# Patient Record
Sex: Male | Born: 1960 | Race: White | Hispanic: No | Marital: Married | Smoking: Never smoker
Health system: Southern US, Community
[De-identification: ages and names within clinical notes are randomized; demographics above are authoritative.]

## PROBLEM LIST (undated history)

## (undated) DIAGNOSIS — B1920 Unspecified viral hepatitis C without hepatic coma: Secondary | ICD-10-CM

## (undated) DIAGNOSIS — J45909 Unspecified asthma, uncomplicated: Secondary | ICD-10-CM

## (undated) DIAGNOSIS — D61818 Other pancytopenia: Secondary | ICD-10-CM

## (undated) DIAGNOSIS — K828 Other specified diseases of gallbladder: Secondary | ICD-10-CM

## (undated) DIAGNOSIS — Z6841 Body Mass Index (BMI) 40.0 and over, adult: Secondary | ICD-10-CM

## (undated) DIAGNOSIS — J449 Chronic obstructive pulmonary disease, unspecified: Secondary | ICD-10-CM

## (undated) DIAGNOSIS — F1910 Other psychoactive substance abuse, uncomplicated: Secondary | ICD-10-CM

## (undated) DIAGNOSIS — N289 Disorder of kidney and ureter, unspecified: Secondary | ICD-10-CM

## (undated) DIAGNOSIS — D509 Iron deficiency anemia, unspecified: Secondary | ICD-10-CM

## (undated) DIAGNOSIS — K219 Gastro-esophageal reflux disease without esophagitis: Secondary | ICD-10-CM

## (undated) DIAGNOSIS — G629 Polyneuropathy, unspecified: Secondary | ICD-10-CM

## (undated) DIAGNOSIS — K766 Portal hypertension: Secondary | ICD-10-CM

## (undated) DIAGNOSIS — G8929 Other chronic pain: Secondary | ICD-10-CM

## (undated) DIAGNOSIS — R161 Splenomegaly, not elsewhere classified: Secondary | ICD-10-CM

## (undated) DIAGNOSIS — F1410 Cocaine abuse, uncomplicated: Secondary | ICD-10-CM

## (undated) DIAGNOSIS — Z9119 Patient's noncompliance with other medical treatment and regimen: Secondary | ICD-10-CM

## (undated) DIAGNOSIS — K648 Other hemorrhoids: Secondary | ICD-10-CM

## (undated) DIAGNOSIS — K8020 Calculus of gallbladder without cholecystitis without obstruction: Secondary | ICD-10-CM

## (undated) DIAGNOSIS — M79606 Pain in leg, unspecified: Secondary | ICD-10-CM

## (undated) DIAGNOSIS — K759 Inflammatory liver disease, unspecified: Secondary | ICD-10-CM

## (undated) DIAGNOSIS — I10 Essential (primary) hypertension: Secondary | ICD-10-CM

## (undated) DIAGNOSIS — K7460 Unspecified cirrhosis of liver: Secondary | ICD-10-CM

## (undated) HISTORY — DX: Other specified diseases of gallbladder: K82.8

## (undated) HISTORY — DX: Splenomegaly, not elsewhere classified: R16.1

## (undated) HISTORY — DX: Calculus of gallbladder without cholecystitis without obstruction: K80.20

## (undated) HISTORY — DX: Iron deficiency anemia, unspecified: D50.9

## (undated) HISTORY — DX: Other psychoactive substance abuse, uncomplicated: F19.10

## (undated) HISTORY — DX: Other pancytopenia: D61.818

## (undated) HISTORY — PX: SIGMOIDOSCOPY: SUR1295

## (undated) HISTORY — DX: Body Mass Index (BMI) 40.0 and over, adult: Z684

## (undated) HISTORY — PX: KNEE SURGERY: SHX244

## (undated) HISTORY — PX: HEMORRHOID SURGERY: SHX153

## (undated) HISTORY — DX: Unspecified cirrhosis of liver: K74.60

## (undated) HISTORY — DX: Gastro-esophageal reflux disease without esophagitis: K21.9

## (undated) HISTORY — DX: Inflammatory liver disease, unspecified: K75.9

## (undated) HISTORY — DX: Other hemorrhoids: K64.8

## (undated) HISTORY — DX: Patient's noncompliance with other medical treatment and regimen: Z91.19

---

## 2004-04-08 ENCOUNTER — Ambulatory Visit (HOSPITAL_COMMUNITY): Admission: RE | Admit: 2004-04-08 | Discharge: 2004-04-08 | Payer: Self-pay | Admitting: Family Medicine

## 2009-05-17 ENCOUNTER — Ambulatory Visit (HOSPITAL_COMMUNITY): Admission: RE | Admit: 2009-05-17 | Discharge: 2009-05-17 | Payer: Self-pay | Admitting: Internal Medicine

## 2009-06-05 ENCOUNTER — Ambulatory Visit: Payer: Self-pay | Admitting: Gastroenterology

## 2009-06-05 DIAGNOSIS — Z862 Personal history of diseases of the blood and blood-forming organs and certain disorders involving the immune mechanism: Secondary | ICD-10-CM

## 2009-06-05 DIAGNOSIS — F1010 Alcohol abuse, uncomplicated: Secondary | ICD-10-CM | POA: Insufficient documentation

## 2009-06-05 DIAGNOSIS — K869 Disease of pancreas, unspecified: Secondary | ICD-10-CM | POA: Insufficient documentation

## 2009-06-05 DIAGNOSIS — Z8639 Personal history of other endocrine, nutritional and metabolic disease: Secondary | ICD-10-CM

## 2009-06-05 DIAGNOSIS — B182 Chronic viral hepatitis C: Secondary | ICD-10-CM

## 2009-06-18 ENCOUNTER — Encounter: Payer: Self-pay | Admitting: Gastroenterology

## 2009-06-25 ENCOUNTER — Encounter: Payer: Self-pay | Admitting: Internal Medicine

## 2009-06-27 ENCOUNTER — Ambulatory Visit (HOSPITAL_COMMUNITY): Admission: RE | Admit: 2009-06-27 | Discharge: 2009-06-27 | Payer: Self-pay | Admitting: Internal Medicine

## 2009-07-04 ENCOUNTER — Encounter: Payer: Self-pay | Admitting: Gastroenterology

## 2009-07-10 ENCOUNTER — Telehealth: Payer: Self-pay | Admitting: Gastroenterology

## 2009-07-11 HISTORY — PX: UPPER GASTROINTESTINAL ENDOSCOPY: SHX188

## 2009-07-25 ENCOUNTER — Encounter: Payer: Self-pay | Admitting: Gastroenterology

## 2009-07-27 ENCOUNTER — Ambulatory Visit: Payer: Self-pay | Admitting: Gastroenterology

## 2009-07-27 ENCOUNTER — Ambulatory Visit (HOSPITAL_COMMUNITY): Admission: RE | Admit: 2009-07-27 | Discharge: 2009-07-27 | Payer: Self-pay | Admitting: Gastroenterology

## 2009-08-02 ENCOUNTER — Encounter: Payer: Self-pay | Admitting: Gastroenterology

## 2009-10-25 ENCOUNTER — Ambulatory Visit: Payer: Self-pay | Admitting: Gastroenterology

## 2009-10-25 ENCOUNTER — Encounter: Payer: Self-pay | Admitting: Internal Medicine

## 2009-10-25 DIAGNOSIS — K219 Gastro-esophageal reflux disease without esophagitis: Secondary | ICD-10-CM

## 2009-10-25 DIAGNOSIS — R5383 Other fatigue: Secondary | ICD-10-CM

## 2009-10-25 DIAGNOSIS — R5381 Other malaise: Secondary | ICD-10-CM

## 2009-10-25 DIAGNOSIS — K7460 Unspecified cirrhosis of liver: Secondary | ICD-10-CM

## 2009-10-25 HISTORY — DX: Gastro-esophageal reflux disease without esophagitis: K21.9

## 2009-11-08 ENCOUNTER — Encounter: Payer: Self-pay | Admitting: Gastroenterology

## 2009-11-19 ENCOUNTER — Encounter: Payer: Self-pay | Admitting: Gastroenterology

## 2009-12-10 ENCOUNTER — Encounter (INDEPENDENT_AMBULATORY_CARE_PROVIDER_SITE_OTHER): Payer: Self-pay | Admitting: *Deleted

## 2009-12-27 ENCOUNTER — Ambulatory Visit: Payer: Self-pay | Admitting: Gastroenterology

## 2010-01-28 ENCOUNTER — Encounter: Payer: Self-pay | Admitting: Gastroenterology

## 2010-02-15 ENCOUNTER — Encounter: Payer: Self-pay | Admitting: Urgent Care

## 2010-02-15 ENCOUNTER — Ambulatory Visit: Payer: Self-pay | Admitting: Internal Medicine

## 2010-02-15 DIAGNOSIS — E119 Type 2 diabetes mellitus without complications: Secondary | ICD-10-CM

## 2010-02-21 ENCOUNTER — Ambulatory Visit (HOSPITAL_COMMUNITY): Admission: RE | Admit: 2010-02-21 | Discharge: 2010-02-21 | Payer: Self-pay | Admitting: Internal Medicine

## 2010-02-28 ENCOUNTER — Ambulatory Visit: Payer: Self-pay | Admitting: Gastroenterology

## 2010-03-05 ENCOUNTER — Encounter: Payer: Self-pay | Admitting: Gastroenterology

## 2010-03-07 ENCOUNTER — Encounter: Payer: Self-pay | Admitting: Gastroenterology

## 2010-03-12 ENCOUNTER — Ambulatory Visit (HOSPITAL_COMMUNITY)
Admission: RE | Admit: 2010-03-12 | Discharge: 2010-03-12 | Payer: Self-pay | Source: Home / Self Care | Admitting: Internal Medicine

## 2010-03-12 ENCOUNTER — Encounter: Payer: Self-pay | Admitting: Urgent Care

## 2010-05-17 ENCOUNTER — Encounter: Payer: Self-pay | Admitting: Gastroenterology

## 2010-06-03 ENCOUNTER — Encounter: Payer: Self-pay | Admitting: Urgent Care

## 2010-08-20 ENCOUNTER — Encounter (INDEPENDENT_AMBULATORY_CARE_PROVIDER_SITE_OTHER): Payer: Self-pay | Admitting: *Deleted

## 2010-09-05 ENCOUNTER — Other Ambulatory Visit: Payer: Self-pay | Admitting: Internal Medicine

## 2010-09-05 ENCOUNTER — Ambulatory Visit
Admission: RE | Admit: 2010-09-05 | Discharge: 2010-09-05 | Payer: Self-pay | Source: Home / Self Care | Attending: Gastroenterology | Admitting: Gastroenterology

## 2010-09-05 DIAGNOSIS — K7460 Unspecified cirrhosis of liver: Secondary | ICD-10-CM

## 2010-09-05 DIAGNOSIS — R109 Unspecified abdominal pain: Secondary | ICD-10-CM | POA: Insufficient documentation

## 2010-09-06 ENCOUNTER — Encounter: Payer: Self-pay | Admitting: Internal Medicine

## 2010-09-10 NOTE — Letter (Signed)
Summary: REQUEST FOR MEDICAL RECORDS  REQUEST FOR MEDICAL RECORDS   Imported By: Rexene Alberts 06/03/2010 12:25:28  _____________________________________________________________________  External Attachment:    Type:   Image     Comment:   External Document

## 2010-09-10 NOTE — Letter (Signed)
Summary: intialeval/referral-dr.stevenzacks  intialeval/referral-dr.stevenzacks   Imported By: Rosine Beat 01/28/2010 13:32:36  _____________________________________________________________________  External Attachment:    Type:   Image     Comment:   External Document

## 2010-09-10 NOTE — Assessment & Plan Note (Signed)
Summary: fu ov in 4 months,fatigue,gerd,cirrhosis,hep c/ss   Visit Type:  Follow-up Visit Primary Care Provider:  [facility]  Chief Complaint:  F/U fatigue/gerd/cirrhosis/hep.  History of Present Illness: Seen Hepatitis Clinic at [facility]  Needs to get blood sugars regulated.  May be candidate for treatment for HCV soon, has appt 7/23.  Occ nausea.  c/o occ upper abd "soreness."  Denies fever, chills, rash, or jaundice.  Denies confusion or MS changes.  c/o eating less and has lost 16# in 9 mo.  Never FU w/ CT as planned to re-evaluate lymph nodes around stomach.  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 Take 1 Tablet By Mouth Two Times A Day 2)  Glipizide 10 Mg Tabs (Glipizide) .... Take 1 Tablet By Mouth Two Times A Day 3)  Lisinopril 10 Mg Tabs (Lisinopril) .... Take 1 Tablet By Mouth Once A Day 4)  Omeprazole 20 Mg Cpdr (Omeprazole) .Marland Kitchen.. 1 By Mouth 30 Minutes Before First Meal  Allergies (verified): No Known Drug Allergies  Past History:  Past Surgical History: Last updated: 06/05/2009 Right knee surgery Hemorrhoidectomy  Past Medical History: Diabetes GERD Hypertension Hepatitis C/alcoholic cirrhosis EGD 07/2010->gastritis, no varices Flexible sigmoidoscopy 9/01, Dr. Gabriel Cirri, small thrombosed external hemorrhoids, several internal hemorrhoids.  Review of Systems      See HPI General:  Denies fever, chills, sweats, anorexia, fatigue, weakness, malaise, weight loss, and sleep disorder. CV:  Denies chest pains, angina, palpitations, syncope, dyspnea on exertion, orthopnea, PND, peripheral edema, and claudication. Resp:  Denies  dyspnea at rest, dyspnea with exercise, cough, sputum, wheezing, coughing up blood, and pleurisy. GI:  Denies difficulty swallowing, pain on swallowing, nausea, indigestion/heartburn, vomiting, vomiting blood, abdominal pain, jaundice, gas/bloating, diarrhea, constipation, change in bowel habits, bloody BM's, black BMs, and fecal incontinence. Derm:  Denies rash, itching, dry skin, hives, moles, warts, and unhealing ulcers. Psych:  Denies depression, anxiety, memory loss, suicidal ideation, hallucinations, paranoia, phobia, and confusion. Heme:  Denies bruising, bleeding, and enlarged lymph nodes.  Vital Signs:  Patient profile:   50 year old male Height:      67 inches Weight:      268 pounds BMI:     42.13 Temp:     97.8 degrees F oral Pulse rate:   68 / minute BP sitting:   130 / 70  (left arm) Cuff size:   large  Vitals Entered By: Cloria Spring LPN (February 16, 5955 8:27 AM)  Physical Exam  General:  Well developed, well nourished, no acute distress. Head:  Normocephalic and atraumatic. Eyes:  Sclera clear no icterus. Mouth:  No deformity or lesionS. Neck:  Supple; no masses. Heart:  Regular rate and rhythm; no murmurs Abdomen:  palpable spleen.  Liver 2 FB below RCM.normal bowel sounds, obese, without guarding, without rebound, no hernia, and no distesion.   Msk:  Symmetrical with no gross deformities. Normal posture. Extremities:  No clubbing, cyanosis, edema or deformities noted. Neurologic:  Alert and  oriented x4;  grossly normal neurologically.  Skin:  Intact without significant lesions or rashes. Cervical Nodes:  No significant cervical adenopathy. Psych:  Alert and cooperative. Normal mood and  affect.  Impression & Recommendations:  Problem # 1:  CIRRHOSIS (ICD-571.5) Well-compensated HCV/eoth cirrhosis, being followed by [facility].  Abnl CT last yr with lymph nodes about the stomach needs to be re-evaluated.  No varices on EGD.  Due for [facility]  screening.  Orders: Est. Patient Level III ([address])  Problem # 2:  HEPATITIS C, CHRONIC (ICD-070.54) See #1  Problem # 3:  GERD (ICD-530.81) Well-controlled on omeprazole  Patient Instructions: 1)  Continue omeprazole 2)  CT abd/pelvis w/ IV/oral contrast 3)  Obtain labs [facility] clinic, if not done, he needs AFP, LFTs, INR, CBC 4)  FU OV 6 months w/ Dr Darrick Penna  Appended Document: fu ov in 4 months,fatigue,gerd,cirrhosis,hep c/ss reminder in computer  Appended Document: fu ov in 4 months,fatigue,gerd,cirrhosis,hep c/ss Has pt had labs done at [facility] mentioned above in past 6 months?  If not, we need to draw.  Thanks.  Appended Document: fu ov in 4 months,fatigue,gerd,cirrhosis,hep c/ss Requested labs from [facility] on 03/20/10/LAW

## 2010-09-10 NOTE — Letter (Signed)
Summary: CT ABD order  CT ABD order   Imported By: Minna Merritts 03/07/2010 17:12:11  _____________________________________________________________________  External Attachment:    Type:   Image     Comment:   External Document

## 2010-09-10 NOTE — Letter (Signed)
[facility]  [facility]   Imported By: Rosine Beat 03/05/2010 13:52:23  _____________________________________________________________________  External Attachment:    Type:   Image     Comment:   External Document

## 2010-09-10 NOTE — Letter (Signed)
Summary: [place] order  [place] order   Imported By: Hendricks Limes LPN 27/25/3664 40:34:74  _____________________________________________________________________  External Attachment:    Type:   Image     Comment:   External Document  Appended Document: [place] order Please change abd [place] to CT abd /pelvis w/ IV/oral contrast re:  FU lymph nodes around stomach (compare w/ previous CT) and HCC screening  Appended Document: [place] order Pt's wife aware the test will be changed to CT. She said please try to schedule for the 14th, that is the day that she has gotten off work for the previously scheduled test. OK to leave appt info on the [phone] number.

## 2010-09-10 NOTE — Letter (Signed)
Summary: HEP C CLINIC REFERRAL  HEP C CLINIC REFERRAL   Imported By: Ave Filter 10/25/2009 10:36:43  _____________________________________________________________________  External Attachment:    Type:   Image     Comment:   External Document

## 2010-09-10 NOTE — Letter (Signed)
Summary: LABS/FREE CLINIC  LABS/FREE CLINIC   Imported By: Diana Eves 11/08/2009 15:07:13  _____________________________________________________________________  External Attachment:    Type:   Image     Comment:   External Document

## 2010-09-10 NOTE — Letter (Signed)
Summary: [facility]PT CONFIRMATION  [facility]PT CONFIRMATION   Imported By: Ave Filter 11/19/2009 13:01:54  _____________________________________________________________________  External Attachment:    Type:   Image     Comment:   External Document  Appended Document: [facility]PT CONFIRMATION Pt's wife informed.

## 2010-09-10 NOTE — Letter (Signed)
Summary: DISABILITY DETERMINATION  DISABILITY DETERMINATION   Imported By: Rexene Alberts 05/17/2010 12:25:50  _____________________________________________________________________  External Attachment:    Type:   Image     Comment:   External Document

## 2010-09-10 NOTE — Letter (Signed)
Summary: HEP C CLINIC CONFIRMATION LETTER  HEP C CLINIC CONFIRMATION LETTER   Imported By: Ave Filter 10/25/2009 11:41:49  _____________________________________________________________________  External Attachment:    Type:   Image     Comment:   External Document

## 2010-09-10 NOTE — Assessment & Plan Note (Signed)
Summary: GERD, CIRRHOSIS, FATIGUE, HCV   Visit Type:  Follow-up Visit Primary Care Provider:  Free clinic  Chief Complaint:  cirrhosis.  History of Present Illness: Low energy. Been throwing up depending on what he eats: onions, lettuce, tomato. Blood in his urine. Urine looked like tea and took collection to lab. No yellow eyes or skin. No blood in stool or black tarry stool. BM: every day-normal. No weight loss. Weight gain: 269->284 lbs. Been a year since no EtOH. No problems swallowing. Taking OMP prior to first meal. Pain in abd better, but still has pain on the sides. No swelling in legs. Toes stay cold and numb. Asking about disability  Current Medications (verified): 1)  Metformin Hcl 1000 Mg Tabs (Metformin Hcl) .... Take 1 Tablet By Mouth Two Times A Day 2)  Glipizide 10 Mg Tabs (Glipizide) .... Take 1 Tablet By Mouth Two Times A Day 3)  Lisinopril 10 Mg Tabs (Lisinopril) .... Take 1 Tablet By Mouth Once A Day 4)  Ranitidine Hcl 300 Mg Caps (Ranitidine Hcl) .... Take 1 Tablet By Mouth Once A Day  Allergies (verified): No Known Drug Allergies  Past History:  Past Medical History: Last updated: 06/05/2009 Diabetes GERD Hypertension Hepatitis C Flexible sigmoidoscopy 9/01, Dr. Gabriel Cirri, small thrombosed external hemorrhoids, several internal hemorrhoids.  Social History: Married 18 years. Daughter. 2 stepsons. 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 Never tob. ETOH use, LAST TIME: AUG 01-08-09.  Last 18 years, 1-2 twelve ounce beers daily. Prior remote IV drug use: AGE 27. Father died Jan 09, 2004.  Review of Systems       Severe depression when mother passed in 01/08/06. Tried meds but caused tactile hallucinations. No suicide thoughts or suicide attempts. No psychiatric admissions.  Vital Signs:  Patient profile:   50 year old male Height:      67 inches Weight:      284 pounds BMI:     44.64 Temp:     97.5 degrees F oral Pulse rate:   64 / minute BP sitting:   120 / 64   (left arm) Cuff size:   large  Vitals Entered By: Cloria Spring LPN (October 25, 2009 9:02 AM)  Physical Exam  General:  Well developed, well nourished, no acute distress. Head:  Normocephalic and atraumatic. Eyes:  PERRLA, no icterus. Mouth:  No deformity or lesionS. Neck:  Supple; no masses. Lungs:  Clear throughout to auscultation. Heart:  Regular rate and rhythm; no murmurs  Impression & Recommendations:  Problem # 1:  GERD (ICD-530.81) Assessment Deteriorated uncontrolled and likely causing intermittent vomtiing. Continue OMP. Informed pt to avoid foods that may exacerbate reflux. Encouraged to lose weight for reflux and better diabetes control. Reflux HO given.  Problem # 2:  CIRRHOSIS (ICD-571.5) Assessment: Unchanged  Needs AFP, HBsAb and TOTAL HAV today. Imaging/AFP NOV 2010.  Orders: T-CBC w/Diff [id number]) T-Hepatic Function [id number]) T-TSH [phone]) T-AFP Tumor Markers [phone]) T-Hepatitis C Antibody ([id number]) Est. Patient Level V (02725)  Problem # 3:  FATIGUE (ICD-780.79) Assessment: New  Pt c/o fatigue and tea colored stool. No jaundice or evidenec of active bleeding. Fatigue likey 2o to multiple comorbidities: DM, Cirrhosis, obesity. Differential includes thyroid disturbance or occult GIB. Will check CBC, HFP, and TSH.  Orders: Est. Patient Level V (36644)  Problem # 4:  HEPATITIS C, CHRONIC (ICD-070.54) Assessment: Unchanged  No EtOH in > 6 MOS. Refer to [facility] [place].  CC: PCP  Orders: T-CBC w/Diff [id number]) T-Hepatic Function [phone]) T-TSH [id number])  T-AFP Tumor Markers [phone]) T-Hepatitis C Antibody ([id number]) Est. Patient Level V (29562)  Problem # 5:  SCREENING, COLON CANCER (ICD-V76.51) Assessment: Comment Only TCS  at age 71, unless he has a low HB.  Patient Instructions: 1)  Take omeprazole 30 minutes before first meal. 2)  Follow REFLUX RECOMMENDATIONS. 3)  WILL CALL YOU WITH LAB  RESULTS. 4)  RETURN VISIT IN 4 MONTHS. 5)  The medication list was reviewed and reconciled.  All changed / newly prescribed medications were explained.  A complete medication list was provided to the patient / caregiver. Prescriptions: OMEPRAZOLE 20 MG CPDR (OMEPRAZOLE) 1 by mouth 30 minutes before first meal  #30 x 5   Entered and Authorized by:   West Bali MD   Signed by:   West Bali MD on 10/25/2009   Method used:   Electronically to        [facility] (retail)       [address] [place], [place]  [address]       Ph: [phone]       Fax: [phone]   RxID:   [id number]

## 2010-09-10 NOTE — Letter (Signed)
Summary: Recall Radiology  [facility]  [address]   [place], [place] [address]   Phone: [phone]  Fax: [phone]    Dec 10, 2009  [name] [address] [place], [place]  [address] Jul 10, 1961   Dear [name],   Our office needs to get you scheduled for your CT Scan. Please give our office a call to schedule this.  You may call the office at your convenience at [phone].  Please ask for the Referral Coordinator to make arrangements for this to be scheduled.  You may have to leave a message on our voice mail.  We will return your call.  If for any reason you do not wish to schedule this, please advise the office.  Please do not neglect your health.   Thank you,    Ave Filter  [facility] Ph: [phone]   Fax: [phone]    Appended Document: Recall Radiology [name] called regarding the letter received RE: f/u CT scan.  She states that at present they are awaiting visit with Hep C clinic and have no insurance.  She has no idea of the amount that would be covered by sliding scale fee.  Would like to see Hep C clinic and I advised that I would let Dr. Darrick Penna know and after that visit if she had further recommendations to proceed with further testing not ordered by Hep clinic we would call them back.  Appended Document: Recall Radiology Please call pt and let him know the CT Scan was to be performed to evaluate LNs in his abdomen. Agrre with HCV visit but will need to have a CT w/i the next 1-2 mos.  Appended Document: Recall Radiology pts wife aware

## 2010-09-10 NOTE — Medication Information (Signed)
Summary: OMEPRAZOLE  OMEPRAZOLE   Imported By: Rexene Alberts 03/12/2010 10:40:28  _____________________________________________________________________  External Attachment:    Type:   Image     Comment:   External Document  Appended Document: OMEPRAZOLE    Prescriptions: OMEPRAZOLE 20 MG CPDR (OMEPRAZOLE) 1 by mouth 30 minutes before first meal  #30 x 11   Entered and Authorized by:   Joselyn Arrow FNP-BC   Signed by:   Joselyn Arrow FNP-BC on 03/12/2010   Method used:   Electronically to        [facility] (retail)       [address] [place], [place]  [address]       Ph: [phone]       Fax: [phone]   RxID:   [id number]

## 2010-09-11 ENCOUNTER — Ambulatory Visit (HOSPITAL_COMMUNITY)
Admission: RE | Admit: 2010-09-11 | Discharge: 2010-09-11 | Disposition: A | Discharge status: Home / Self Care | Payer: Self-pay | Source: Ambulatory Visit | Attending: Internal Medicine | Admitting: Internal Medicine

## 2010-09-11 ENCOUNTER — Encounter: Payer: Self-pay | Admitting: Gastroenterology

## 2010-09-11 DIAGNOSIS — B1920 Unspecified viral hepatitis C without hepatic coma: Secondary | ICD-10-CM | POA: Insufficient documentation

## 2010-09-11 DIAGNOSIS — R161 Splenomegaly, not elsewhere classified: Secondary | ICD-10-CM | POA: Insufficient documentation

## 2010-09-11 DIAGNOSIS — K7460 Unspecified cirrhosis of liver: Secondary | ICD-10-CM | POA: Insufficient documentation

## 2010-09-11 LAB — CONVERTED CEMR LAB: INR: 0.98 (ref <1.50)

## 2010-09-12 ENCOUNTER — Encounter: Payer: Self-pay | Admitting: Gastroenterology

## 2010-09-12 NOTE — Letter (Signed)
Summary: RAD REPORT U/S ABD  RAD REPORT U/S ABD   Imported By: Rexene Alberts 09/06/2010 08:48:10  _____________________________________________________________________  External Attachment:    Type:   Image     Comment:   External Document

## 2010-09-12 NOTE — Letter (Signed)
Summary: Recall Office Visit  [facility]  [address]   [place], [place] [address]   Phone: [phone]  Fax: [phone]      August 20, 2010   [name] [address] [place], [place]  [address] 05-Sep-1960   Dear [name],   According to our records, it is time for you to schedule a follow-up office visit with [place].   At your convenience, please call [phone] to schedule an office visit. If you have any questions, concerns, or feel that this letter is in error, we would appreciate your call.   Sincerely,    Diana Eves  [facility] Ph: [phone]   Fax: [phone]

## 2010-09-16 ENCOUNTER — Encounter: Payer: Self-pay | Admitting: Gastroenterology

## 2010-09-16 LAB — CONVERTED CEMR LAB
ALT: 126 units/L — ABNORMAL HIGH (ref 0–53)
Alkaline Phosphatase: 88 units/L (ref 39–117)
Indirect Bilirubin: 0.5 mg/dL (ref 0.0–0.9)

## 2010-09-18 NOTE — Assessment & Plan Note (Signed)
Summary: LIVER IS SWELLING.FU OV/CIRRHOSIS/SS   Visit Type:  Follow-up Visit Primary Care Provider:  [facility]  CC:  follow up= cirrhosis and still having some pain.  History of Present Illness: Presents in f/u. Hx of chronic Hep C, ETOH cirrhosis. Some confusion regarding Hep C clinic. Saw Dr. [name] Lasso twice. First in Dec 30, 2022, which notes we do have. We do not have the f/u visit done after that. Pt states was told "they don't have a nurse to do the injections". Pt is quite unclear as to status of care with chronic Hep C. Presents today c/o chronic RUQ pain/ right back pain, feels like it "pulls". . pain exacerbated by movement. worsened with eating/drinking. no n/v. no jaundice, denies confusion and/or MS changes. . +soreness constant in right upper abdomen. 5/10. not on omeprazole anymore. unsure of what he is taking now, had been having nocturnal reflux. states started on a new medication, helped "alot". pt feels wt gain is due to dietary issues. Up 12 lbs from last July. was 268 now 280. Has +stones, no HIDA documented in past.   CT 03/2010:  Stable mildly prominent upper abdominal and right cardiophrenic   angle lymph nodes.   Cirrhotic appearing liver with splenomegaly, perisplenic varices   and spontaneous splenorenal shunt.   Cholelithiasis.   No new upper abdominal abnormalities.   Current Medications (verified): 1)  Metformin Hcl 1000 Mg Tabs (Metformin Hcl) .... Take 1 Tablet By Mouth Two Times A Day 2)  Glipizide 10 Mg Tabs (Glipizide) .... Take 1 Tablet By Mouth Two Times A Day 3)  Lisinopril 20 Mg Tabs (Lisinopril) .... Once Daily 4)  Omeprazole 20 Mg Cpdr (Omeprazole) .Marland Kitchen.. 1 By Mouth 30 Minutes Before First Meal  Allergies (verified): No Known Drug Allergies  Past History:  Past Medical History: Last updated: 02/15/2010 Diabetes GERD Hypertension Hepatitis C/alcoholic cirrhosis EGD 07/2010->gastritis, no varices Flexible sigmoidoscopy 9/01, Dr. Gabriel Cirri, small thrombosed  external hemorrhoids, several internal hemorrhoids.  Past Surgical History: Last updated: 06/05/2009 Right knee surgery Hemorrhoidectomy  Family History: Last updated: 06/05/2009 Pat aunt, cirrhosis, alcohol related? Mat uncle, cirrhosis, alcohol related? Father, deceased, melanoma, DM, COPD Mother, deceased, COPD No FH colon cancer Cousin, HCV, s/p treatment  Social History: Last updated: 10/25/2009 Married 18 years. Daughter. 2 stepsons. 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 Never tob. ETOH use, LAST TIME: AUG 12-29-2008.  Last 18 years, 1-2 twelve ounce beers daily. Prior remote IV drug use: AGE 79. Father died 2003/12/30.  Review of Systems General:  Denies fever, chills, and anorexia. Eyes:  Denies blurring, irritation, and discharge. ENT:  Denies sore throat, hoarseness, and difficulty swallowing. CV:  Denies chest pains and syncope. Resp:  Denies dyspnea at rest and wheezing. GI:  Complains of abdominal pain; denies difficulty swallowing, pain on swallowing, nausea, constipation, change in bowel habits, bloody BM's, and black BMs. GU:  Denies urinary burning and urinary frequency. MS:  Complains of low back pain; denies joint pain / LOM, joint swelling, and joint stiffness. Derm:  Denies rash, itching, and dry skin. Neuro:  Denies weakness and syncope. Psych:  Denies depression and anxiety. Endo:  Denies cold intolerance and heat intolerance.  Vital Signs:  Patient profile:   50 year old male Height:      67 inches Weight:      280 pounds BMI:     44.01 Temp:     98.0 degrees F oral Pulse rate:   76 / minute BP sitting:   124 /  80  (left arm) Cuff size:   large  Vitals Entered By: Hendricks Limes LPN (September 05, 2010 1:28 PM)  Physical Exam  General:  Well developed, well nourished, no acute distress. Head:  Normocephalic and atraumatic. Eyes:  sclera without icterus Mouth:  No deformity or lesions, dentition normal. Lungs:  Clear throughout to  auscultation. Heart:  Regular rate and rhythm; no murmurs, rubs,  or bruits. Abdomen:  obese, +BS, soft, non-tender, non-distended. No definite HSM, no rebound or guarding.  Msk:  Symmetrical with no gross deformities. Normal posture. Pulses:  Normal pulses noted. Neurologic:  Alert and  oriented x4;  grossly normal neurologically. No asterixis.  Skin:  Intact without significant lesions or rashes.  Impression & Recommendations:  Problem # 1:  HEPATITIS C, CHRONIC (ICD-070.54)  Hx of Hep C, has been referred to see Dr. [name] Lasso; we have reports from May but no f/u reports. It is somewhat unclear as to the status of this. Pt seems to think he was going to receive some type of treatment, but "no nurse available".   Obtain reports from Dr. [name] Lasso Further rec's to follow  Orders: Est. Patient Level II (54098)  Problem # 2:  CIRRHOSIS (ICD-571.5) HCV/ETOH cirrhosis. Stable at this time, no ascites noted. Due for [facility] screening and labs.  [place] of abdomen AFP, LFTs, INR Low-fat diet handout Orders: T-AFP Tumor Markers ([id number]) T-Hepatic Function ([id number]) T-PT (Prothrombin Time) (46962) Est. Patient Level II (95284)  Problem # 3:  ABDOMINAL PAIN, CHRONIC (ICD-789.00)  hx ofchronic RUQ pain/ right back pain that is worsened by eating/drinking. Does have known stones on past [place] and CT. No prior HIDA scan. Last EGD Dec 2011 with gastritis. States reflux controlled with some new medication; we do not have this updated name. Up 12 pounds from last July. Diff dx: biliary etiology, functional abdominal pain.   [place] of abdomen (due for HCC screening). Will likely order HIDA scan following review Review ordered labs Need updated medication as to what pt is taking Further rec's to follow   Orders: Est. Patient Level II (13244)

## 2010-09-26 ENCOUNTER — Other Ambulatory Visit: Payer: Self-pay | Admitting: Internal Medicine

## 2010-09-26 NOTE — Miscellaneous (Signed)
Summary: Orders Update  Clinical Lists Changes  Orders: Added new Test order of T-Hepatic Function ([id number]) - Signed Added new Test order of T-AFP Tumor Markers ([id number]) - Signed 

## 2010-09-30 ENCOUNTER — Other Ambulatory Visit (HOSPITAL_COMMUNITY): Payer: Self-pay

## 2010-10-03 ENCOUNTER — Encounter (HOSPITAL_COMMUNITY): Payer: Self-pay

## 2010-10-03 ENCOUNTER — Encounter (HOSPITAL_COMMUNITY)
Admission: RE | Admit: 2010-10-03 | Discharge: 2010-10-03 | Disposition: A | Discharge status: Home / Self Care | Payer: Self-pay | Source: Ambulatory Visit | Attending: Internal Medicine | Admitting: Internal Medicine

## 2010-10-03 DIAGNOSIS — R109 Unspecified abdominal pain: Secondary | ICD-10-CM | POA: Insufficient documentation

## 2010-10-03 HISTORY — DX: Essential (primary) hypertension: I10

## 2010-10-03 MED ORDER — TECHNETIUM TC 99M MEBROFENIN IV KIT
5.0000 | PACK | Freq: Once | INTRAVENOUS | Status: AC | PRN
  Administered 2010-10-03: 5.3 via INTRAVENOUS

## 2010-11-20 ENCOUNTER — Ambulatory Visit (INDEPENDENT_AMBULATORY_CARE_PROVIDER_SITE_OTHER): Payer: Self-pay | Admitting: Gastroenterology

## 2010-11-20 ENCOUNTER — Encounter: Payer: Self-pay | Admitting: Gastroenterology

## 2010-11-20 VITALS — BP 138/76 | HR 79 | Temp 98.7°F | Ht 70.0 in | Wt 279.6 lb

## 2010-11-20 DIAGNOSIS — B182 Chronic viral hepatitis C: Secondary | ICD-10-CM

## 2010-11-20 DIAGNOSIS — K219 Gastro-esophageal reflux disease without esophagitis: Secondary | ICD-10-CM

## 2010-11-20 MED ORDER — OMEPRAZOLE 20 MG PO CPDR: DELAYED_RELEASE_CAPSULE | ORAL | Status: DC

## 2010-11-20 NOTE — Patient Instructions (Signed)
Lose weight: 10-20 lbs in next 6 mos. Continue Omeprazole for reflux. Put heat on right side three times a day. Follow up in 6 mos. Will recheck labs. Will try to get appt to [facility].

## 2010-11-20 NOTE — Progress Notes (Signed)
  Subjective:    Patient ID: [name], male    DOB: [birth year], 50 y.o.   MRN: [id number]  HPI Contacted by HCV clinic and saw Dr. Jacqualine Mau and was suppose to fill out paperwork for [facility]. Still c/o abd pain which is worse with movement and feels sore. Easily fatigued. If pushes on it or lays a certain way, it hurts. No EtOH or tobacco products.    Review of Systems MAR 2011 284 LBs    Objective:   Physical Exam  Constitutional: He is oriented to person, place, and time. He appears well-developed.  HENT:  Head: Normocephalic and atraumatic.  Eyes: Pupils are equal, round, and reactive to light.  Neck: Normal range of motion. Neck supple.  Abdominal: Soft. Bowel sounds are normal. There is tenderness.       MILD TTP IN RUQ, POS CARNETT'S SIGN  Musculoskeletal: He exhibits no edema.  Neurological: He is alert and oriented to person, place, and time.  Psychiatric: He has a normal mood and affect.          Assessment & Plan:

## 2010-11-20 NOTE — Assessment & Plan Note (Signed)
Pending appt at HCV clinic. Last AFP/HFP FEB 2012 Put heat on right side three times a day for right sided abd pain. Follow up in 6 mos. Will recheck labs. Will try to get appt to [facility].

## 2010-11-20 NOTE — Assessment & Plan Note (Signed)
Out of meds. Sx fairly well controlled. Lose weight: 10-20 lbs in next 6 mos. Continue Omeprazole for reflux.

## 2010-11-21 NOTE — Progress Notes (Signed)
Pt is aware of his OV on 04/23/11 with [facility]

## 2011-03-12 ENCOUNTER — Encounter: Payer: Self-pay | Admitting: General Practice

## 2011-03-13 ENCOUNTER — Encounter: Payer: Self-pay | Admitting: Gastroenterology

## 2011-03-17 ENCOUNTER — Telehealth: Payer: Self-pay | Admitting: Gastroenterology

## 2011-03-17 ENCOUNTER — Other Ambulatory Visit: Payer: Self-pay | Admitting: General Practice

## 2011-03-17 DIAGNOSIS — K7460 Unspecified cirrhosis of liver: Secondary | ICD-10-CM

## 2011-03-17 NOTE — Telephone Encounter (Signed)
Pt is scheduled for u/s 03/20/11@9 :00am. Pt aware of appt.

## 2011-03-20 ENCOUNTER — Ambulatory Visit (HOSPITAL_COMMUNITY)
Admission: RE | Admit: 2011-03-20 | Discharge: 2011-03-20 | Disposition: A | Discharge status: Home / Self Care | Payer: Self-pay | Source: Ambulatory Visit | Attending: Urgent Care | Admitting: Urgent Care

## 2011-03-20 ENCOUNTER — Other Ambulatory Visit: Payer: Self-pay | Admitting: Gastroenterology

## 2011-03-20 DIAGNOSIS — K7460 Unspecified cirrhosis of liver: Secondary | ICD-10-CM | POA: Insufficient documentation

## 2011-03-20 DIAGNOSIS — R161 Splenomegaly, not elsewhere classified: Secondary | ICD-10-CM | POA: Insufficient documentation

## 2011-03-20 DIAGNOSIS — R109 Unspecified abdominal pain: Secondary | ICD-10-CM | POA: Insufficient documentation

## 2011-03-21 LAB — HEPATIC FUNCTION PANEL
ALT: 188 U/L — ABNORMAL HIGH (ref 0–53)
AST: 251 U/L — ABNORMAL HIGH (ref 0–37)
Albumin: 3.7 g/dL (ref 3.5–5.2)
Alkaline Phosphatase: 105 U/L (ref 39–117)

## 2011-03-27 ENCOUNTER — Other Ambulatory Visit: Payer: Self-pay

## 2011-03-27 DIAGNOSIS — K7460 Unspecified cirrhosis of liver: Secondary | ICD-10-CM

## 2011-03-27 NOTE — Progress Notes (Signed)
Quick Note:  AFP increased from 6 months ago. AST/ALT increased as well.  [place] without evidence for HCC.   Is pt drinking ETOH? Has he established care at [facility]?  Repeat AFP in 3 mos. Should have appt coming up soon. Will cc Dr. Darrick Penna for further rec's. ______

## 2011-03-31 ENCOUNTER — Other Ambulatory Visit: Payer: Self-pay

## 2011-03-31 DIAGNOSIS — K7460 Unspecified cirrhosis of liver: Secondary | ICD-10-CM

## 2011-03-31 NOTE — Progress Notes (Signed)
Quick Note:  I called and spoke with the appt desk at the [facility] about Mr [name] appt. They stated they had contacted the pts wife and they were sent paperwork for financial aid but Mr [name] had not sent anything back and there has been no further follow up by [facility]. I have refaxed all notes in regards to his Hep C & cirrhosis. They will contact the pt again for an appt. I also faxed notes to [facility] for evaluation of gallstones, they will also contact pt with appt. I left Mr [name] a mess with these details. ______

## 2011-04-11 ENCOUNTER — Telehealth: Payer: Self-pay | Admitting: Gastroenterology

## 2011-04-11 NOTE — Telephone Encounter (Signed)
Started by mistake

## 2011-04-23 ENCOUNTER — Ambulatory Visit: Payer: Self-pay | Admitting: Gastroenterology

## 2011-04-30 ENCOUNTER — Encounter: Payer: Self-pay | Admitting: Gastroenterology

## 2011-04-30 ENCOUNTER — Telehealth: Payer: Self-pay | Admitting: Gastroenterology

## 2011-04-30 ENCOUNTER — Ambulatory Visit (INDEPENDENT_AMBULATORY_CARE_PROVIDER_SITE_OTHER): Payer: Self-pay | Admitting: Gastroenterology

## 2011-04-30 DIAGNOSIS — K7460 Unspecified cirrhosis of liver: Secondary | ICD-10-CM

## 2011-04-30 DIAGNOSIS — K8020 Calculus of gallbladder without cholecystitis without obstruction: Secondary | ICD-10-CM

## 2011-04-30 NOTE — Progress Notes (Signed)
  Subjective:    Patient ID: [name], male    DOB: 08-08-1961, 50 y.o.   MRN: [id number]  [facility]  HPI Always has pain on the right side-Pain in right side not worse after eating. Last CT AUG 2011-calcified gallstones. After he eats he does not have vomiting. Appt at [facility] 19. APPT W/ DR. ZACKS IN [place] FOR HCV IN NOV.   Past Medical History  Diagnosis Date  . Hypertension   . Diabetes mellitus   . GERD (gastroesophageal reflux disease)     DEC 2010 EGD/Bx REACTIVE GASTROPATHY, NO VARICES  . Hemorrhoids, internal   . BMI 40.0-44.9, adult OCT 2010 269 LBS  . 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 IV drug abuse REMOTE  . Hepatitis 2010 HEP C    AST 509 ALT 267 ALK PHOS 165 ALB 3.8 NEG IGM HAV/HBSAg  . Gallstone AUG 2012 1 CM   Past Surgical History  Procedure Date  . Sigmoidoscopy     2001 DR. FLEISCHMAN INTERNAL HERMORRHOIDS  . Upper gastrointestinal endoscopy DEC 2010    BENIGN POLYPS, GASTRITIS, ?phg  . Knee surgery RIGHT  . Hemorrhoid surgery     No Known Allergies  Current Outpatient Prescriptions  Medication Sig Dispense Refill  . esomeprazole (NEXIUM) 40 MG capsule Take 40 mg by mouth daily before breakfast. 1 PO BID      . glipiZIDE (GLUCOTROL) 10 MG tablet Take 10 mg by mouth 2 (two) times daily before a meal.        . metFORMIN (GLUMETZA) 1000 MG (MOD) 24 hr tablet Take 1,000 mg by mouth 2 (two) times daily with a meal.        . quinapril (ACCUPRIL) 20 MG tablet Take 20 mg by mouth at bedtime.              Review of Systems     Objective:   Physical Exam  Constitutional: He is oriented to person, place, and time. He appears well-nourished. No distress.  HENT:  Head: Normocephalic and atraumatic.  Mouth/Throat: Oropharynx is clear and moist. No oropharyngeal exudate.  Eyes: Pupils are equal, round, and reactive to light. No scleral icterus.  Neck: Normal range of motion. Thyromegaly present.  Cardiovascular: Normal  rate, regular rhythm and normal heart sounds.   Pulmonary/Chest: Effort normal and breath sounds normal.  Abdominal: Soft. Bowel sounds are normal. He exhibits no distension. There is no tenderness.       OBESE  Lymphadenopathy:    He has no cervical adenopathy.  Neurological: He is alert and oriented to person, place, and time.       NO FOCAL DEFICITS  Psychiatric:       FLAT AFFECT          Assessment & Plan:

## 2011-04-30 NOTE — Telephone Encounter (Signed)
Spoke w/ [name] @ [facility]- she stated [name] does not have an appt as of yet in regards to his GB- She said she sent him a letter on 04/02/11 but has not gotten a response.  He is scheduled to see [name] in the Hepatology Clinic for his Hep C on 06/20/11.

## 2011-04-30 NOTE — Telephone Encounter (Signed)
NOTED

## 2011-05-01 ENCOUNTER — Encounter: Payer: Self-pay | Admitting: Gastroenterology

## 2011-05-01 DIAGNOSIS — K8020 Calculus of gallbladder without cholecystitis without obstruction: Secondary | ICD-10-CM | POA: Insufficient documentation

## 2011-05-01 NOTE — Assessment & Plan Note (Addendum)
Pt has gained 18 lbs since 2010.  Encouraged him to lose weight. OPV w/ Dr. Jacqualine Mau. OPV w/ [facility] in FEB 2013. Pt will need CMP, PT/INR, AFP, AND EGD to screen for varices IN DEC 2013.  ADDENDUM [id number]: NEEDS TOTAL HAV AND HBV sAg & sAb. Schedule for EGD AFTER NEXT VISIT.

## 2011-05-01 NOTE — Progress Notes (Signed)
Reminder in epic to follow up with SF in 6 months °

## 2011-05-01 NOTE — Assessment & Plan Note (Signed)
1 CM gallstone that is Asx. Pt unable to see surgery due to insurance reasons.  Consider lap choly if pt develops biliary colic. Reassess q6 mos.

## 2011-05-01 NOTE — Progress Notes (Signed)
[facility] 

## 2011-06-19 ENCOUNTER — Ambulatory Visit (INDEPENDENT_AMBULATORY_CARE_PROVIDER_SITE_OTHER): Payer: Self-pay | Admitting: Gastroenterology

## 2011-06-19 DIAGNOSIS — K7460 Unspecified cirrhosis of liver: Secondary | ICD-10-CM

## 2011-06-19 DIAGNOSIS — K7689 Other specified diseases of liver: Secondary | ICD-10-CM

## 2011-06-19 DIAGNOSIS — B182 Chronic viral hepatitis C: Secondary | ICD-10-CM

## 2011-06-26 NOTE — Progress Notes (Addendum)
[name], [name]    MR#:  [id number]      DATE:  06/19/2011  DOB:  [birth year]    cc: Consulting Physician:  Gerome Apley, Montez Hageman., PA, [facility], [address] [place], [place] [address], Fax  [phone] Primary Care Physician:  [facility] [place], [address] [place], [place] [address], [place] [phone] Referring Physician:  Jonette Eva, MD, [facility], [address], [place], [place] [address], Fax [phone]    [facility] medical record number:  [id number].   REASON FOR VISIT:  Follow up genotype 3A HCV with imaging to suggest cirrhosis.   History:  The patient returns today accompanied by his wife and his sister-in-law. I had last seen him on 02/28/2010. At that time, he was supposed to have his records transferred to [facility] to start the process of being  treated for his hepatitis C at [facility], but this apparently never occurred. In addition, he was to complete the [employer] application forms for medication assistance as he lacks insurance.  Today, the patient's sister-in-law comes in today states that they filed their application but there is one more piece of information that have to supply in order to be approved. Through all this he  continued to be followed at the [facility] in [place], who sent more lab work in August. In addition, he was followed by Dr. Darrick Penna. I have a clinic note from Dr. Darrick Penna of 04/30/2011, discussing the right  upper quadrant discomfort, and an ultrasound of 03/24/2011, showed a new finding of a 1 cm mobile gallstone without evidence of gallbladder wall thickening or pericholecystic fluid, but with evidence of  cirrhosis on the imaging of the liver, as well splenomegaly to suggest portal hypertension. He was referred to surgery for consideration of a laparoscopic cholecystectomy on 05/22/2011 at [facility] on 05/22/2011. It was their thinking that his liver disease would preclude surgery. They said that if  his biliary disease was significant that he could have a sphincterotomy by ERCP and a cholecystostomy tube in interventional radiology.  Today the patient states that he continues to have this diffuse, constant, abdominal discomfort. There is no association between eating and worsening of symptoms. He has no nausea or vomiting. There is no  acholic stools or dark urine. He has no fever, or jaundice. Weight has increased by 20 pounds since last being seen.  There are no symptoms to suggest decompensated liver disease though he has imaging evidence to suggest cirrhosis. There is no history of ascites or symptoms of encephalopathy. His last imaging of his liver  was presumably ultrasound from 03/24/2011. He has no history of variceal bleeding. Dr. Darrick Penna' note of 04/30/2011, indicates the need for an endoscopy as I suggested in December 201s. My note actually  stated that he should undergo an endoscopy between December 2012 and the year of 2013. His last screening was 07/27/2009, which did not show any evidence of esophageal or gastric varices.   Past medical history:  Significant for type 2 diabetes. He reports that his diabetes control has been poor and there is discussion about starting him on insulin in the next few weeks through the [facility].  Typically his a.m. fasting blood sugars are over 200. He also discussed the need to change some his blood pressure medications to get better control of his blood pressure.   CURRENT MEDICATIONS:  Accupril 20 mg b.i.d., Nexium 40 mg b.i.d., metformin 1000 mg b.i.d., Glucotrol 10 mg b.i.d.  ALLERGIES:  None.    HABITS:  Smoking, never. Alcohol, denies interval consumption.   REVIEW OF SYSTEMS:  All 10 systems reviewed today with the patient and they are negative other than which was mentioned above. CES-D was 12.   PHYSICAL EXAMINATION:   Constitutional:  Appears somewhat older than stated age but without significant  peripheral wasting. Vital signs: Height 70 inches, his weight 289 pounds, blood pressure 160/82, pulse 76, temperature 97.9  Fahrenheit. Ears, nose, mouth and throat:  Unremarkable oropharynx.  No thyromegaly or neck masses.  Chest:  Resonant to percussion.  Clear to auscultation.  Cardiovascular:  Heart sounds normal S1, S2 without  murmurs or rubs.  There is no peripheral edema.  Abdominal: Central obesity. Normal bowel sounds.  Tenderness in the right upper quadrant, but there was tenderness in all 4 quadrants without rebound or  guarding. It was slightly worse in the right upper quadrant. Murphy's sign was negative. There was no referred tenderness. There was no ascites that I could appreciate nor were there any hernias. I could  not appreciate spleen tip or liver edge.  Lymphatics:  No cervical or inguinal lymphadenopathy.  Central Nervous System:  No asterixis or focal neurologic findings.  Dermatologic:  Anicteric without palmar  erythema or spider angiomata.  Eyes:  Anicteric sclerae.  Pupils are equal and reactive to light.   laboratories:  On 03/27/2011, orders for labs for AFP was done but the results do not appear in the electronic medical record, as did the labs of 03/20/2011.  From the [facility] on 02/15/2011, his CBC revealed white count of 4.5, hemoglobin 12.5, platelet count of 94,000, AST was 265 ALT 198 alp 106, total bilirubin 1.0, albumin 4.1.  07/09/2010 his hemoglobin A1c was 7%.   Assessment and plan:  The patient is a 50 year old gentleman with history of genotype 3A HCV with radiographic imaging and lab testing to suggest cirrhosis. I do not have sufficient labs to calculate a MELD score at this time, but  he was previously well compensated. It is likely that obesity has contributed to the progression of liver disease. There also is remote alcohol use that could have also contributed.  In terms of treating his hepatitis C, I note he has gained 20  pounds again and his blood sugars are poorly controlled, rendering him a poor candidate for treatment. In addition, he has not completed the  application for patient assistance through [employer]. His diabetes regimen is about to be tightened up by his primary physician according to him and his sister-in-law. They have already arranged for him to be  seen by me in August 20, 2011, at [facility] at which time, I can follow up on his diabetes control and by that time the family would have filed the extra paperwork for [place].  In terms of his cirrhosis care, he was screened for varices in 07/27/2009, without evidence of varices and needs to be screened again in 2012-2013. His hepatocellular cancer screening was by ultrasound on  03/24/2011 and needs to be repeated by February 2013. He needs to be vaccinated against hepatitis A and B as he was previously naive and it has not been done. There is no ascites or encephalopathy to treat.  In terms of his complaints of vague, diffuse, abdominal pain. His symptoms alone did not suggest chronic or acute cholecystitis. Even his sister-in-law told suggested this to me based on his symptoms.  Furthermore, his ultrasound does not show any evidence of  cholecystitis in that his gallbladder wall is of normal thickness and there is no evidence of pericholecystic fluid.  If he is to require any biliary intervention there would need to be a hard evidence of cholecystitis before the risk arising from his cirrhosis would be justified by the benefit of decompressing the gallbladder.  In my discussion today with the patient, his wife, and sister-in-law, we discussed the need to control his diabetes. I have asked him to bring logs of his blood sugars with him, as well as any lab testing as  well as asked for any lab testing done at the [facility] in [place] be sent to used to our office in [place] by fax [phone].   plan:  1. I have asked him to bring in copies of  his blood sugar log at next appointment on August 20, 2011, at 10:48 a.m. at [facility]. 2. He will need an outpatient upper endoscopy to screen for varices by Dr. Jettie Booze between December 2012 and 2013. 3. He will need an ultrasound in February 201, which can be arranged at the time of the next clinic appointment in 2013. 4. He will need started at [facility] would be seen there. 5. He will follow the remainder of his application for [employer] for [facility] Access to Care. 6. He will have a follow up appointment Dr. Darrick Penna in 2013.            Brooke Dare, MD   [phone]  D:  Thu Nov 08 16:31:24 2012 ; TMorey Hummingbird Nov 08 18:24:29 2012  Job #:  [id number]   ADDENDUM  EGD 09-08-11 Nena Alexander MD Jeani Hawking  No varices or mention of portal htnsive gastropathy.

## 2011-08-11 ENCOUNTER — Other Ambulatory Visit: Payer: Self-pay | Admitting: Gastroenterology

## 2011-08-11 MED ORDER — ESOMEPRAZOLE MAGNESIUM 40 MG PO CPDR: DELAYED_RELEASE_CAPSULE | ORAL | Status: DC

## 2011-08-11 NOTE — Telephone Encounter (Signed)
Routing to the Rx refill basket.

## 2011-08-11 NOTE — Telephone Encounter (Signed)
pts wife called- he is out of nexium and needs a refill called in- to med assistance- [phone]

## 2011-08-11 NOTE — Telephone Encounter (Signed)
Addended by: Tiffany Kocher on: 08/11/2011 02:53 PM   Modules accepted: Orders

## 2011-08-12 DIAGNOSIS — R161 Splenomegaly, not elsewhere classified: Secondary | ICD-10-CM

## 2011-08-12 DIAGNOSIS — D61818 Other pancytopenia: Secondary | ICD-10-CM

## 2011-08-12 HISTORY — DX: Other pancytopenia: D61.818

## 2011-08-12 HISTORY — DX: Splenomegaly, not elsewhere classified: R16.1

## 2011-08-20 ENCOUNTER — Other Ambulatory Visit: Payer: Self-pay | Admitting: Gastroenterology

## 2011-08-20 DIAGNOSIS — B182 Chronic viral hepatitis C: Secondary | ICD-10-CM

## 2011-08-20 DIAGNOSIS — K7460 Unspecified cirrhosis of liver: Secondary | ICD-10-CM

## 2011-08-28 ENCOUNTER — Ambulatory Visit (INDEPENDENT_AMBULATORY_CARE_PROVIDER_SITE_OTHER): Payer: Self-pay | Admitting: Gastroenterology

## 2011-08-28 ENCOUNTER — Encounter: Payer: Self-pay | Admitting: Gastroenterology

## 2011-08-28 VITALS — BP 143/69 | HR 83 | Temp 99.0°F | Ht 69.0 in | Wt 282.6 lb

## 2011-08-28 DIAGNOSIS — K7460 Unspecified cirrhosis of liver: Secondary | ICD-10-CM

## 2011-08-28 DIAGNOSIS — B182 Chronic viral hepatitis C: Secondary | ICD-10-CM

## 2011-08-28 DIAGNOSIS — Z1211 Encounter for screening for malignant neoplasm of colon: Secondary | ICD-10-CM

## 2011-08-28 NOTE — Progress Notes (Signed)
Per Dr. Darrick Penna, waiting for labs from Dr.Zacks. ( Put AFP scheduled for 10/01/2011 on hold until she reviews those labs )

## 2011-08-28 NOTE — Progress Notes (Signed)
Reminder in epic to follow up in 6 months with SF/E30 and to have CMP, CBC WITHDIFF, PT/INR AND AFP

## 2011-08-28 NOTE — Progress Notes (Addendum)
Subjective:    Patient ID: [name], male    DOB: 05-01-1961, 51 y.o.   MRN: [id number]  [facility]  HPI Seen at [facility]. IS A CANDIDATE FOR TREATMENT. Will start in [place]. NEEDS EGD. Dr. Jacqualine Mau told him he needed to lose weight. Started the HABV series. Pt walking but having problems with SOB. Uses inhaler prn when walking. LAST EGD 2010 NO VARICES. Feels better than he did a year. NO RECTAL BLEEDING OR BLACK TARRY STOOLS. C/O CHRONIC RUQ PAIN.  Past Medical History  Diagnosis Date  . Hypertension   . Diabetes mellitus   . GERD (gastroesophageal reflux disease)     DEC 2010 EGD/Bx REACTIVE GASTROPATHY, NO VARICES  . Hemorrhoids, internal   . BMI 40.0-44.9, adult OCT 2010 269 LBS    APR 2012 279 LBS  . 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 IV drug abuse REMOTE  . Hepatitis 2010 HEP C    AST 509 ALT 267 ALK PHOS 165 ALB 3.8 NEG IGM HAV/HBSAg  . Gallstone AUG 2012 1 CM   Past Surgical History  Procedure Date  . Sigmoidoscopy     2001 DR. FLEISCHMAN INTERNAL HERMORRHOIDS  . Upper gastrointestinal endoscopy DEC 2010    BENIGN POLYPS, GASTRITIS, ?phg  . Knee surgery RIGHT  . Hemorrhoid surgery     No Known Allergies  Current Outpatient Prescriptions  Medication Sig Dispense Refill  . albuterol (PROVENTIL HFA;VENTOLIN HFA) 108 (90 BASE) MCG/ACT inhaler Inhale 2 puffs into the lungs every 6 (six) hours as needed.      Marland Kitchen esomeprazole (NEXIUM) 40 MG capsule Take 40 mg by mouth 2 (two) times daily. One po once to twice daily before a meal.      . glipiZIDE (GLUCOTROL) 10 MG tablet Take 10 mg by mouth 2 (two) times daily before a meal.        . insulin detemir (LEVEMIR) 100 UNIT/ML injection Inject 10 Units into the skin at bedtime.      . metFORMIN (GLUMETZA) 1000 MG (MOD) 24 hr tablet Take 1,000 mg by mouth 2 (two) times daily with a meal.        . quinapril (ACCUPRIL) 20 MG tablet Take 40 mg by mouth at bedtime.         Family History  Problem  Relation Age of Onset  . Colon cancer Neg Hx       Review of Systems JAN 2013 [facility]: CR 0.70 AST 213 ALT 165 ALK PHOS 117 T BILI 0.9 GGT 414 ALB 3.7 INR 1.0 PLT 82 HB 12.3 WBC 4.9    Objective:   Physical Exam  Constitutional: He is oriented to person, place, and time. He appears well-nourished. No distress.  HENT:  Head: Normocephalic and atraumatic.  Mouth/Throat: Oropharynx is clear and moist. No oropharyngeal exudate.  Eyes: Pupils are equal, round, and reactive to light. No scleral icterus.  Neck: Normal range of motion. Neck supple.  Cardiovascular: Normal rate, regular rhythm and normal heart sounds.   Pulmonary/Chest: Effort normal and breath sounds normal. No respiratory distress.  Abdominal: Soft. Bowel sounds are normal. He exhibits no distension. There is no tenderness.       OBESE   Musculoskeletal: Normal range of motion.  Lymphadenopathy:    He has no cervical adenopathy.  Neurological: He is alert and oriented to person, place, and time.       NO FOCAL DEFICITS   Psychiatric: He has a  normal mood and affect.          Assessment & Plan:

## 2011-08-28 NOTE — Assessment & Plan Note (Signed)
AWAITING TREATMENT BY DR. ZACKS

## 2011-08-28 NOTE — Assessment & Plan Note (Signed)
TCS AFTER APR 2013 OR 14

## 2011-08-28 NOTE — Assessment & Plan Note (Addendum)
Awaiting HCV TREATMENT. EGD JAN 2013. ABD U/S FEB 2013 OPV IN 6 MOS. NEEDS CMP, CBC/DIFF, PT/INR, & AFP GET LABS FROM DR. ZACKS.

## 2011-08-28 NOTE — Progress Notes (Signed)
[facility] 

## 2011-08-28 NOTE — Patient Instructions (Signed)
UPPER ENDOSCOPY IN JAN. ABDOMINAL U/S IN FEB. FOLLOW UP IN JUL.

## 2011-09-03 ENCOUNTER — Encounter (HOSPITAL_COMMUNITY)
Admission: RE | Admit: 2011-09-03 | Discharge: 2011-09-03 | Disposition: A | Discharge status: Home / Self Care | Payer: Self-pay | Source: Ambulatory Visit | Attending: Gastroenterology | Admitting: Gastroenterology

## 2011-09-03 ENCOUNTER — Other Ambulatory Visit: Payer: Self-pay

## 2011-09-03 ENCOUNTER — Encounter (HOSPITAL_COMMUNITY): Payer: Self-pay | Admitting: Pharmacy Technician

## 2011-09-03 ENCOUNTER — Encounter (HOSPITAL_COMMUNITY): Payer: Self-pay

## 2011-09-03 HISTORY — DX: Chronic obstructive pulmonary disease, unspecified: J44.9

## 2011-09-03 HISTORY — DX: Unspecified viral hepatitis C without hepatic coma: B19.20

## 2011-09-03 LAB — BASIC METABOLIC PANEL
CO2: 23 mEq/L (ref 19–32)
Chloride: 104 mEq/L (ref 96–112)
Glucose, Bld: 160 mg/dL — ABNORMAL HIGH (ref 70–99)
Potassium: 3.6 mEq/L (ref 3.5–5.1)
Sodium: 137 mEq/L (ref 135–145)

## 2011-09-03 LAB — HEMOGLOBIN AND HEMATOCRIT, BLOOD: HCT: 34.9 % — ABNORMAL LOW (ref 39.0–52.0)

## 2011-09-03 NOTE — Patient Instructions (Addendum)
20 [name]  09/03/2011   Your procedure is scheduled on:  09/08/2011  Report to [facility] at  700  AM.  Call this number if you have problems the morning of surgery: [phone]   Remember:   Do not eat food:After Midnight.  May have clear liquids:until Midnight .  Clear liquids include soda, tea, black coffee, apple or grape juice, broth.  Take these medicines the morning of surgery with A SIP OF WATER: nexium,accupril   Do not wear jewelry, make-up or nail polish.  Do not wear lotions, powders, or perfumes. You may wear deodorant.  Do not shave 48 hours prior to surgery.  Do not bring valuables to the hospital.  Contacts, dentures or bridgework may not be worn into surgery.  Leave suitcase in the car. After surgery it may be brought to your room.  For patients admitted to the hospital, checkout time is 11:00 AM the day of discharge.   Patients discharged the day of surgery will not be allowed to drive home.  Name and phone number of your driver: family  Special Instructions: N/A   Please read over the following fact sheets that you were given: Pain Booklet, Surgical Site Infection Prevention, Anesthesia Post-op Instructions and Care and Recovery After Surgery Esophagogastroduodenoscopy This is an endoscopic procedure (a procedure that uses a device like a flexible telescope) that allows your caregiver to view the upper stomach and small bowel. This test allows your caregiver to look at the esophagus. The esophagus carries food from your mouth to your stomach. They can also look at your duodenum. This is the first part of the small intestine that attaches to the stomach. This test is used to detect problems in the bowel such as ulcers and inflammation. PREPARATION FOR TEST Nothing to eat after midnight the day before the test. NORMAL FINDINGS Normal esophagus, stomach, and duodenum. Ranges for normal findings may vary among different laboratories and hospitals. You should always  check with your doctor after having lab work or other tests done to discuss the meaning of your test results and whether your values are considered within normal limits. MEANING OF TEST  Your caregiver will go over the test results with you and discuss the importance and meaning of your results, as well as treatment options and the need for additional tests if necessary. OBTAINING THE TEST RESULTS It is your responsibility to obtain your test results. Ask the lab or department performing the test when and how you will get your results. Document Released: 11/28/2004 Document Revised: 04/09/2011 Document Reviewed: 07/07/2008 [facility] Patient Information 2012 [place], [place].PATIENT INSTRUCTIONS POST-ANESTHESIA  IMMEDIATELY FOLLOWING SURGERY:  Do not drive or operate machinery for the first twenty four hours after surgery.  Do not make any important decisions for twenty four hours after surgery or while taking narcotic pain medications or sedatives.  If you develop intractable nausea and vomiting or a severe headache please notify your doctor immediately.  FOLLOW-UP:  Please make an appointment with your surgeon as instructed. You do not need to follow up with anesthesia unless specifically instructed to do so.  WOUND CARE INSTRUCTIONS (if applicable):  Keep a dry clean dressing on the anesthesia/puncture wound site if there is drainage.  Once the wound has quit draining you may leave it open to air.  Generally you should leave the bandage intact for twenty four hours unless there is drainage.  If the epidural site drains for more than 36-48 hours please call the anesthesia department.  QUESTIONS?:  Please feel free to call your physician or the hospital operator if you have any questions, and they will be happy to assist you.     [facility] Anesthesia Department [address] [place] [phone]

## 2011-09-07 NOTE — H&P (Signed)
BP Pulse Temp(Src) Ht Wt BMI    143/69  83  99 F (37.2 C) (Temporal)  5\' 9"  (1.753 m)  282 lb 9.6 oz (128.187 kg)  41.73 kg/m2       Progress Notes     Jonette Eva, MD  09/03/2011  1:25 PM  Addendum    Subjective:      Patient ID: [name], male    DOB: Jul 27, 1961, 51 y.o.   MRN: [id number]   [facility]P: [facility]   HPI Seen at [facility]. IS A CANDIDATE FOR TREATMENT. Will start in [place]. NEEDS EGD. Dr. Jacqualine Mau told him he needed to lose weight. Started the HABV series. Pt walking but having problems with SOB. Uses inhaler prn when walking. LAST EGD 2010 NO VARICES. Feels better than he did a year. NO RECTAL BLEEDING OR BLACK TARRY STOOLS. C/O CHRONIC RUQ PAIN.    Past Medical History   Diagnosis  Date   .  Hypertension     .  Diabetes mellitus     .  GERD (gastroesophageal reflux disease)         DEC 2010 EGD/Bx REACTIVE GASTROPATHY, NO VARICES   .  Hemorrhoids, internal     .  BMI 40.0-44.9, adult  OCT 2010 269 LBS       APR 2012 279 LBS   .  Cirrhosis  NOV 2010 CHILD PUGH A       ETOH/HCV/OBESITY   .  IV drug abuse  REMOTE   .  Hepatitis  2010 HEP C       AST 509 ALT 267 ALK PHOS 165 ALB 3.8 NEG IGM HAV/HBSAg   .  Gallstone  AUG 2012 1 CM    Past Surgical History   Procedure  Date   .  Sigmoidoscopy         2001 DR. FLEISCHMAN INTERNAL HERMORRHOIDS   .  Upper gastrointestinal endoscopy  DEC 2010       BENIGN POLYPS, GASTRITIS, ?phg   .  Knee surgery  RIGHT   .  Hemorrhoid surgery        No Known Allergies    Current Outpatient Prescriptions   Medication  Sig  Dispense  Refill   .  albuterol (PROVENTIL HFA;VENTOLIN HFA) 108 (90 BASE) MCG/ACT inhaler  Inhale 2 puffs into the lungs every 6 (six) hours as needed.         Marland Kitchen  esomeprazole (NEXIUM) 40 MG capsule  Take 40 mg by mouth 2 (two) times daily. One po once to twice daily before a meal.         .  glipiZIDE (GLUCOTROL) 10 MG tablet  Take 10 mg by mouth 2 (two) times daily before a meal.           .   insulin detemir (LEVEMIR) 100 UNIT/ML injection  Inject 10 Units into the skin at bedtime.         .  metFORMIN (GLUMETZA) 1000 MG (MOD) 24 hr tablet  Take 1,000 mg by mouth 2 (two) times daily with a meal.           .  quinapril (ACCUPRIL) 20 MG tablet  Take 40 mg by mouth at bedtime.              Family History   Problem  Relation  Age of Onset   .  Colon cancer  Neg Hx  Review of Systems JAN 2013 [facility]: CR 0.70 AST 213 ALT 165 ALK PHOS 117 T BILI 0.9 GGT 414 ALB 3.7 INR 1.0 PLT 82 HB 12.3 WBC 4.9   Objective:    Physical Exam  Constitutional: He is oriented to person, place, and time. He appears well-nourished. No distress.  HENT:   Head: Normocephalic and atraumatic.   Mouth/Throat: Oropharynx is clear and moist. No oropharyngeal exudate.  Eyes: Pupils are equal, round, and reactive to light. No scleral icterus.  Neck: Normal range of motion. Neck supple.  Cardiovascular: Normal rate, regular rhythm and normal heart sounds.   Pulmonary/Chest: Effort normal and breath sounds normal. No respiratory distress.  Abdominal: Soft. Bowel sounds are normal. He exhibits no distension. There is no tenderness.       OBESE  Musculoskeletal: Normal range of motion.  Lymphadenopathy:    He has no cervical adenopathy.  Neurological: He is alert and oriented to person, place, and time.       NO FOCAL DEFICITS  Psychiatric: He has a normal mood and affect.            Assessment & Plan:        Previous Version  [place] Score  08/28/2011 11:00 AM  Signed Cc to [facility]  Ferne Reus  08/28/2011  2:37 PM  Signed Reminder in epic to follow up in 6 months with SF/E30 and to have CMP, CBC WITHDIFF, PT/INR AND AFP     CIRRHOSIS - Jonette Eva, MD  08/28/2011  9:32 AM  Addendum Awaiting HCV TREATMENT. EGD JAN 2013. ABD U/S FEB 2013 OPV IN 6 MOS. NEEDS CMP, CBC/DIFF, PT/INR, & AFP GET LABS FROM DR. ZACKS.  Previous Version  HEPATITIS C, CHRONIC - Jonette Eva, MD  08/28/2011  9:59 AM  Signed AWAITING TREATMENT BY DR. ZACKS  Colon cancer screening - Jonette Eva, MD  08/28/2011 10:02 AM  Signed TCS AFTER APR 2013 OR 14

## 2011-09-08 ENCOUNTER — Other Ambulatory Visit: Payer: Self-pay | Admitting: Gastroenterology

## 2011-09-08 ENCOUNTER — Ambulatory Visit (HOSPITAL_COMMUNITY): Payer: Self-pay | Admitting: Anesthesiology

## 2011-09-08 ENCOUNTER — Encounter (HOSPITAL_COMMUNITY): Payer: Self-pay | Admitting: Anesthesiology

## 2011-09-08 ENCOUNTER — Encounter (HOSPITAL_COMMUNITY): Payer: Self-pay | Admitting: *Deleted

## 2011-09-08 ENCOUNTER — Encounter (HOSPITAL_COMMUNITY)
Admission: RE | Disposition: A | Discharge status: Home / Self Care | Payer: Self-pay | Source: Ambulatory Visit | Attending: Gastroenterology

## 2011-09-08 ENCOUNTER — Ambulatory Visit (HOSPITAL_COMMUNITY)
Admission: RE | Admit: 2011-09-08 | Discharge: 2011-09-08 | Disposition: A | Discharge status: Home / Self Care | Payer: Self-pay | Source: Ambulatory Visit | Attending: Gastroenterology | Admitting: Gastroenterology

## 2011-09-08 DIAGNOSIS — Z01812 Encounter for preprocedural laboratory examination: Secondary | ICD-10-CM | POA: Insufficient documentation

## 2011-09-08 DIAGNOSIS — D131 Benign neoplasm of stomach: Secondary | ICD-10-CM

## 2011-09-08 DIAGNOSIS — I10 Essential (primary) hypertension: Secondary | ICD-10-CM | POA: Insufficient documentation

## 2011-09-08 DIAGNOSIS — Z01810 Encounter for preprocedural cardiovascular examination: Secondary | ICD-10-CM | POA: Insufficient documentation

## 2011-09-08 DIAGNOSIS — K7460 Unspecified cirrhosis of liver: Secondary | ICD-10-CM | POA: Insufficient documentation

## 2011-09-08 DIAGNOSIS — K2990 Gastroduodenitis, unspecified, without bleeding: Secondary | ICD-10-CM

## 2011-09-08 DIAGNOSIS — E119 Type 2 diabetes mellitus without complications: Secondary | ICD-10-CM | POA: Insufficient documentation

## 2011-09-08 DIAGNOSIS — Z6841 Body Mass Index (BMI) 40.0 and over, adult: Secondary | ICD-10-CM | POA: Insufficient documentation

## 2011-09-08 DIAGNOSIS — K319 Disease of stomach and duodenum, unspecified: Secondary | ICD-10-CM | POA: Insufficient documentation

## 2011-09-08 DIAGNOSIS — K2970 Gastritis, unspecified, without bleeding: Secondary | ICD-10-CM

## 2011-09-08 HISTORY — PX: BIOPSY: SHX5522

## 2011-09-08 LAB — GLUCOSE, CAPILLARY: Glucose-Capillary: 169 mg/dL — ABNORMAL HIGH (ref 70–99)

## 2011-09-08 SURGERY — ESOPHAGOGASTRODUODENOSCOPY (EGD) WITH PROPOFOL
Anesthesia: Monitor Anesthesia Care

## 2011-09-08 MED ORDER — STERILE WATER FOR IRRIGATION IR SOLN
Status: DC | PRN
  Administered 2011-09-08: 09:00:00

## 2011-09-08 MED ORDER — PROPOFOL 10 MG/ML IV EMUL
INTRAVENOUS | Status: DC | PRN
  Administered 2011-09-08: 25 ug/kg/min via INTRAVENOUS

## 2011-09-08 MED ORDER — STERILE WATER FOR IRRIGATION IR SOLN
Status: DC | PRN
  Administered 2011-09-08: 1000 mL

## 2011-09-08 MED ORDER — ONDANSETRON HCL 4 MG/2ML IJ SOLN: 4.0000 mg | Freq: Once | INTRAMUSCULAR | Status: DC | PRN

## 2011-09-08 MED ORDER — GLYCOPYRROLATE 0.2 MG/ML IJ SOLN
0.2000 mg | Freq: Once | INTRAMUSCULAR | Status: AC
  Administered 2011-09-08: 0.2 mg via INTRAVENOUS

## 2011-09-08 MED ORDER — MIDAZOLAM HCL 5 MG/5ML IJ SOLN
INTRAMUSCULAR | Status: DC | PRN
  Administered 2011-09-08: 2 mg via INTRAVENOUS

## 2011-09-08 MED ORDER — FENTANYL CITRATE 0.05 MG/ML IJ SOLN: 25.0000 ug | INTRAMUSCULAR | Status: DC | PRN

## 2011-09-08 MED ORDER — PROPOFOL 10 MG/ML IV EMUL
INTRAVENOUS | Status: AC
  Filled 2011-09-08: qty 20

## 2011-09-08 MED ORDER — MIDAZOLAM HCL 2 MG/2ML IJ SOLN
INTRAMUSCULAR | Status: AC
  Administered 2011-09-08: 2 mg via INTRAVENOUS
  Filled 2011-09-08: qty 2

## 2011-09-08 MED ORDER — GLYCOPYRROLATE 0.2 MG/ML IJ SOLN
INTRAMUSCULAR | Status: AC
  Administered 2011-09-08: 0.2 mg via INTRAVENOUS
  Filled 2011-09-08: qty 1

## 2011-09-08 MED ORDER — MIDAZOLAM HCL 2 MG/2ML IJ SOLN
INTRAMUSCULAR | Status: AC
  Filled 2011-09-08: qty 2

## 2011-09-08 MED ORDER — LACTATED RINGERS IV SOLN
INTRAVENOUS | Status: DC
  Administered 2011-09-08: 08:00:00 via INTRAVENOUS

## 2011-09-08 MED ORDER — LIDOCAINE HCL 1 % IJ SOLN
INTRAMUSCULAR | Status: DC | PRN
  Administered 2011-09-08: 25 mg via INTRADERMAL

## 2011-09-08 MED ORDER — MIDAZOLAM HCL 2 MG/2ML IJ SOLN
1.0000 mg | INTRAMUSCULAR | Status: DC | PRN
Start: 2011-09-08 — End: 2011-09-08
  Administered 2011-09-08: 2 mg via INTRAVENOUS

## 2011-09-08 MED ORDER — BUTAMBEN-TETRACAINE-BENZOCAINE 2-2-14 % EX AERO
1.0000 | INHALATION_SPRAY | Freq: Once | CUTANEOUS | Status: AC
  Administered 2011-09-08: 1 via TOPICAL
  Filled 2011-09-08: qty 56

## 2011-09-08 SURGICAL SUPPLY — 17 items
BLOCK BITE 60FR ADLT L/F BLUE (MISCELLANEOUS) ×3 IMPLANT
ELECT REM PT RETURN 9FT ADLT (ELECTROSURGICAL)
ELECTRODE REM PT RTRN 9FT ADLT (ELECTROSURGICAL) IMPLANT
FLOOR PAD 36X40 (MISCELLANEOUS)
FORCEP RJ3 GP 1.8X160 W-NEEDLE (CUTTING FORCEPS) IMPLANT
FORCEPS BIOP RAD 4 LRG CAP 4 (CUTTING FORCEPS) ×1 IMPLANT
NDL SCLEROTHERAPY 25GX240 (NEEDLE) IMPLANT
NEEDLE SCLEROTHERAPY 25GX240 (NEEDLE) IMPLANT
PAD FLOOR 36X40 (MISCELLANEOUS) ×2 IMPLANT
PROBE APC STR FIRE (PROBE) IMPLANT
PROBE INJECTION GOLD (MISCELLANEOUS)
PROBE INJECTION GOLD 7FR (MISCELLANEOUS) IMPLANT
SNARE SHORT THROW 13M SML OVAL (MISCELLANEOUS) IMPLANT
SYR 50ML LL SCALE MARK (SYRINGE) ×1 IMPLANT
TUBING ENDO SMARTCAP PENTAX (MISCELLANEOUS) ×5 IMPLANT
TUBING IRRIGATION ENDOGATOR (MISCELLANEOUS) ×3 IMPLANT
WATER STERILE IRR 1000ML POUR (IV SOLUTION) ×2 IMPLANT

## 2011-09-08 NOTE — Anesthesia Postprocedure Evaluation (Signed)
  Anesthesia Post-op Note  Patient: [name]  Procedure(s) Performed:  ESOPHAGOGASTRODUODENOSCOPY (EGD) WITH PROPOFOL; BIOPSY - Gastric Biopsies and Gastric Polyp Biopsy  Patient Location: PACU  Anesthesia Type: MAC  Level of Consciousness: awake, alert , oriented and patient cooperative  Airway and Oxygen Therapy: Patient Spontanous Breathing  Post-op Pain: none  Post-op Assessment: Post-op Vital signs reviewed, Patient's Cardiovascular Status Stable, Respiratory Function Stable, Patent Airway and No signs of Nausea or vomiting  Post-op Vital Signs: Reviewed and stable  Complications: No apparent anesthesia complications

## 2011-09-08 NOTE — Transfer of Care (Signed)
Immediate Anesthesia Transfer of Care Note  Patient: [name]  Procedure(s) Performed:  ESOPHAGOGASTRODUODENOSCOPY (EGD) WITH PROPOFOL; BIOPSY - Gastric Biopsies and Gastric Polyp Biopsy  Patient Location: PACU  Anesthesia Type: MAC  Level of Consciousness: awake, alert , oriented and patient cooperative  Airway & Oxygen Therapy: Patient Spontanous Breathing and Patient connected to face mask oxygen  Post-op Assessment: Report given to PACU RN, Post -op Vital signs reviewed and stable and Patient moving all extremities  Post vital signs: Reviewed and stable  Complications: No apparent anesthesia complications

## 2011-09-08 NOTE — Interval H&P Note (Signed)
History and Physical Interval Note:  09/08/2011 8:08 AM  [name]  has presented today for surgery, with the diagnosis of varicess  and cirrhosis  The various methods of treatment have been discussed with the patient and family. After consideration of risks, benefits and other options for treatment, the patient has consented to  Procedure(s): ESOPHAGOGASTRODUODENOSCOPY (EGD) WITH PROPOFOL as a surgical intervention .  The patients' history has been reviewed, patient examined, no change in status, stable for surgery.  I have reviewed the patients' chart and labs.  Questions were answered to the patient's satisfaction.     [employer]

## 2011-09-08 NOTE — Op Note (Addendum)
[facility] [address] [place], [place]  [address]  ENDOSCOPY PROCEDURE REPORT  PATIENT:  [name], [name]  MR#:  [id number] BIRTHDATE:  [birth year], 50 yrs. old  GENDER:  male  ENDOSCOPIST:  Jonette Eva, MD Referred by:  Brooke Dare, M.D.  PROCEDURE DATE:  09/08/2011 PROCEDURE:  EGD with biopsy, 432[address] ASA CLASS: INDICATIONS:  HCV CIRRHOSIS, CHILD PUGH A, LAST EGD 2010-NO VARICES, awaiting HCV Rx  MEDICATIONS:   MAC sedation, administered by CRNA TOPICAL ANESTHETIC:  Cetacaine Spray  DESCRIPTION OF PROCEDURE:     Physical exam was performed. Informed consent was obtained from the patient after explaining the benefits, risks, and alternatives to the procedure.  The patient was connected to the monitor and placed in the left lateral position.  Continuous oxygen was provided by nasal cannula and IV medicine administered through an indwelling cannula.  After administration of sedation, the patient's esophagus was intubated and the  endoscope was advanced under direct visualization to the second portion of the duodenum.  The scope was removed slowly by carefully examining the color, texture, anatomy, and integrity of the mucosa on the way out.  The patient was recovered in endoscopy and discharged home in satisfactory condition. <<PROCEDUREIMAGES>>  Moderate gastritis was found & BIOPSID OBTAINED VIA COLD FORCEPS. There were multiple polyps identified in the body of the stomach BIOPSIED VIA COLD FORCEPS. NO VARICES: ESOPHAGUS, FUNDUS, OR DUODENUM. NL DUODENUM. SLIGHTLY SIGMOID DISTAL ESOPHAGUS. COMPLICATIONS:    None  ENDOSCOPIC IMPRESSION: 1) Moderate gastritis 2) Polyps, multiple in the body of the stomach  RECOMMENDATIONS: CHILD PUGH A-EGD IN 2016 OPV IN 6 MOS. NEED SCREENING TCS WITHIN THE NEXT YEAR AWAIT BIOPSIES CONTINUE NEXIUM BID  REPEAT EXAM:  No  ______________________________ Jonette Eva, MD  CC:  n. REVISED:  09/08/2011 09:05 AM eSIGNED:    Remus Hagedorn at 09/08/2011 09:05 AM  Morken, [place], [id number]

## 2011-09-08 NOTE — Anesthesia Preprocedure Evaluation (Addendum)
Anesthesia Evaluation  Patient identified by MRN, date of birth, ID band Patient awake    Reviewed: Allergy & Precautions, H&P , NPO status , Patient's Chart, lab work & pertinent test results  Airway Mallampati: II      Dental  (+) Edentulous Upper   Pulmonary COPD COPD inhaler,  + rhonchi        Cardiovascular hypertension, Pt. on medications Regular Normal    Neuro/Psych    GI/Hepatic GERD-  Medicated and Controlled,(+) Cirrhosis -  Esophageal Varices and ascites     , Hepatitis -, C  Endo/Other  Diabetes mellitus-, Well Controlled, Type 2, Insulin Dependent  Renal/GU      Musculoskeletal   Abdominal   Peds  Hematology   Anesthesia Other Findings   Reproductive/Obstetrics                           Anesthesia Physical Anesthesia Plan  ASA: III  Anesthesia Plan: MAC   Post-op Pain Management:    Induction: Intravenous  Airway Management Planned: Simple Face Mask  Additional Equipment:   Intra-op Plan:   Post-operative Plan:   Informed Consent: I have reviewed the patients History and Physical, chart, labs and discussed the procedure including the risks, benefits and alternatives for the proposed anesthesia with the patient or authorized representative who has indicated his/her understanding and acceptance.     Plan Discussed with:   Anesthesia Plan Comments:         Anesthesia Quick Evaluation

## 2011-09-10 ENCOUNTER — Telehealth: Payer: Self-pay | Admitting: Gastroenterology

## 2011-09-10 NOTE — Telephone Encounter (Signed)
Pt informed

## 2011-09-10 NOTE — Telephone Encounter (Signed)
Please call pt. His stomach Bx shows mild gastritis. Continue NEXIUM 30 minutes prior to meals TWICE DAILY. CONTINUE WEIGHT LOSS EFFORTS. FOLLOW A LOW FAT/DIABETIC DIET. REPEAT EGD IN 2016.

## 2011-09-10 NOTE — Telephone Encounter (Signed)
Reminder in epic to have repeat EGD in 2016

## 2011-09-11 ENCOUNTER — Encounter (HOSPITAL_COMMUNITY): Payer: Self-pay | Admitting: Gastroenterology

## 2011-09-11 NOTE — Telephone Encounter (Signed)
Results Faxed to [facility]

## 2011-09-26 NOTE — Progress Notes (Signed)
Patient ID: [name], male   DOB: [birth year], 51 y.o.   MRN: [id number] Pt's wife called this morning needing to change the date of the US of the abd so I called and changed it to Feb. 22 at 9:00 and will need to be there at 8:30. Wife is aware of the change.

## 2011-09-30 ENCOUNTER — Other Ambulatory Visit (HOSPITAL_COMMUNITY): Payer: Self-pay

## 2011-10-03 ENCOUNTER — Ambulatory Visit (HOSPITAL_COMMUNITY)
Admission: RE | Admit: 2011-10-03 | Discharge: 2011-10-03 | Disposition: A | Discharge status: Home / Self Care | Payer: Self-pay | Source: Ambulatory Visit | Attending: Gastroenterology | Admitting: Gastroenterology

## 2011-10-03 DIAGNOSIS — B182 Chronic viral hepatitis C: Secondary | ICD-10-CM

## 2011-10-03 DIAGNOSIS — K7460 Unspecified cirrhosis of liver: Secondary | ICD-10-CM

## 2011-10-03 DIAGNOSIS — R160 Hepatomegaly, not elsewhere classified: Secondary | ICD-10-CM | POA: Insufficient documentation

## 2011-10-03 DIAGNOSIS — B1920 Unspecified viral hepatitis C without hepatic coma: Secondary | ICD-10-CM | POA: Insufficient documentation

## 2011-10-03 DIAGNOSIS — R161 Splenomegaly, not elsewhere classified: Secondary | ICD-10-CM | POA: Insufficient documentation

## 2011-10-03 DIAGNOSIS — K8020 Calculus of gallbladder without cholecystitis without obstruction: Secondary | ICD-10-CM | POA: Insufficient documentation

## 2011-10-08 ENCOUNTER — Other Ambulatory Visit: Payer: Self-pay

## 2011-10-08 DIAGNOSIS — K7460 Unspecified cirrhosis of liver: Secondary | ICD-10-CM

## 2011-10-08 NOTE — Progress Notes (Signed)
Per Dr. Darrick Penna, pt needs AFP. Called and informed pt. Lab order faxed to [facility].

## 2011-10-08 NOTE — Progress Notes (Signed)
Opened in error. Orders already placed in computer.

## 2011-10-16 ENCOUNTER — Other Ambulatory Visit: Payer: Self-pay | Admitting: Gastroenterology

## 2011-10-16 DIAGNOSIS — B182 Chronic viral hepatitis C: Secondary | ICD-10-CM

## 2011-10-16 NOTE — Progress Notes (Signed)
[name] has received approval from [place] for Pegasys and Ribavirin.  He needs an appt for teaching.

## 2011-10-17 LAB — AFP TUMOR MARKER: AFP-Tumor Marker: 12.4 ng/mL — ABNORMAL HIGH (ref 0.0–8.0)

## 2011-10-17 NOTE — Progress Notes (Signed)
Quick Note:    Informed pt  ______

## 2011-11-14 ENCOUNTER — Telehealth: Payer: Self-pay

## 2011-11-14 NOTE — Telephone Encounter (Signed)
He can have his labs drawn at [facility] and will need an appt in two weeks in [place]. He need not get labs done until I see him, and I will do them at [facility] labs which accepts the charity care he has.  From: Lisabeth Pick  Sent: Wednesday, November 05, 2011 9:23 AM To: Aris Lot Subject: Cory, Castillo #[id number] Hi Dr. Jacqualine Mau, Is it a problem for [name] to have his lab work done at [facility] since he has charity care there? He wants to start his treatment on Friday the 29th and will follow up with you at the [facility] clinic. Thanks, Cory July, RN [facility] Dept. of Gastroenterology and Hepatology [phone] [facility]. [address], [place] [address] [email or web address] Phone-[phone] Fax-[phone]

## 2011-11-20 ENCOUNTER — Ambulatory Visit (INDEPENDENT_AMBULATORY_CARE_PROVIDER_SITE_OTHER): Payer: Self-pay | Admitting: Gastroenterology

## 2011-11-20 DIAGNOSIS — K7460 Unspecified cirrhosis of liver: Secondary | ICD-10-CM

## 2011-11-20 DIAGNOSIS — B182 Chronic viral hepatitis C: Secondary | ICD-10-CM

## 2011-11-27 NOTE — Progress Notes (Signed)
NAME:  [name], [name]  MR#:  [id number]      DATE:  11/20/2011  DOB:  06-24-1961    cc: Consulting Physician: Gerome Apley, Montez Hageman., PA, [facility], [address], [place], [place] [address], Fax [phone] Primary Care Physician: [facility] [place], [address], [place], [place] [address], [place] [phone] Referring Physician: Jonette Eva, MD, [facility], [address] [place], [place] [address], Fax [phone]    [facility] Medical Record numberL:  [id number].   REASOn for visit:  Followup genotype 3a HCV at week 2 of treatment.   HISTORY:  The patient returns accompanied by his wife. His date of commencement for peg interferon and ribavirin for genotype 3a hepatitis C with radiographic evidence to suggest cirrhosis was on 11/07/2011. He will take his third injection tomorrow. He did well with the first injection. He developed headaches and significant fatigue after the second injection. Otherwise, he feels is tolerating therapy well and denies any significant depressive symptoms.   PAST MEDICAL HISTORY: It will be recalled that he is a type 2 diabetic. He reports his a.m. fasting blood sugars ranged between 172-180.   current medications:  1. Accupril 20 mg b.i.d.  2. Nexium 40 mg b.i.d.  3. Metformin 1000 mg b.i.d.  4. Glucotrol 10 mg b.i.d.  5. Pegasys 180 mcg weekly. 6. Ribavirin 400 mg b.i.d.   ALLERGIES:  Denies.   habits:  Smoking, never. Alcohol denies interval consumption.    REVIEW OF SYSTEMS:  All 10 systems reviewed today with the patient and they are negative other than which was mentioned above. His CES-D was 22.   PHYSICAL EXAMINATION:  Constitutional: Central obesity. Vital signs: Height 66 inches, weight 218 pounds, down 7 pounds from when seen on 06/19/2011, blood pressure 161/78, pulse 72, temperature 98.2 Fahrenheit.   assessment:  The patient is a 51 year old gentleman with history of genotype  3a HCV with radiographic imaging and lab testing to suggest cirrhosis. He is very motivated to start on therapy for his hepatitis C and his start was 11/07/2011, putting him at approximately week 2 with his third injection due tomorrow. We plan to treat him for 24 weeks as tolerated. If he does not respond, hopefully by then we will have word on when a direct acting antiviral will be available in combination with ribavirin and whether it will be effective in this situation.   In terms of his cirrhosis care, he was screened for varices on 09/08/2011, by Dr. Darrick Penna at [facility], which did not show any evidence of varices or portal hypertensive gastropathy. This would need to be repeated in January 2015-2016. His HCC screening was by ultrasound on 10/03/2011, and would need to be repeated by August 2013. He has no encephalopathy or ascites to treat. He got his second hepatitis B vaccine in February, having received his first in January 2013 at [facility]. He received Pneumovax on 08/20/2011 at [facility]. He also received his first hepatitis A vaccine on 08/20/2011. He does to have his second one by July 2013. He needs Twinrix in July 2013.   In my discussion today with the patient and his family who accompanied him, we discussed the course of his treatment today. We discussed the importance of returning in 2 weeks' time for his week 4 HCV RNA. He is aware of the fact that he needs to contact the pharmacy directly to request shipments of new medication rather than contacting my office.   PLAN:  1.  Repeat EGD by Dr. Darrick Penna in January 2015-2016.  2. Ultrasound August 2013 to be ordered at his subsequent visit.  3. Due for final Twinrix in July 2013.          4. Pneumovax on 08/20/2011.  5. CBC today.  6. Return in 2 weeks' time for his week 4 HCV RNA and standard labs.                  Brooke Dare, MD    ADDENDUM CBC cancelled for some reason.  [id number] .[id number]  D:  Thu Apr 11 13:50:35 2013 ; T:  Thu Apr  11 20:17:58 2013  Job #:  [id number]

## 2011-12-04 ENCOUNTER — Ambulatory Visit (INDEPENDENT_AMBULATORY_CARE_PROVIDER_SITE_OTHER): Payer: Self-pay | Admitting: Gastroenterology

## 2011-12-04 DIAGNOSIS — B182 Chronic viral hepatitis C: Secondary | ICD-10-CM

## 2011-12-04 LAB — CBC WITH DIFFERENTIAL/PLATELET
Basophils Absolute: 0 10*3/uL (ref 0.0–0.1)
HCT: 35.3 % — ABNORMAL LOW (ref 39.0–52.0)
Lymphocytes Relative: 36 % (ref 12–46)
Monocytes Absolute: 0.3 10*3/uL (ref 0.1–1.0)
Neutro Abs: 1.4 10*3/uL — ABNORMAL LOW (ref 1.7–7.7)
Neutrophils Relative %: 51 % (ref 43–77)
RDW: 13.8 % (ref 11.5–15.5)
WBC: 2.7 10*3/uL — ABNORMAL LOW (ref 4.0–10.5)

## 2011-12-04 LAB — HEPATIC FUNCTION PANEL
AST: 225 U/L — ABNORMAL HIGH (ref 0–37)
Albumin: 4 g/dL (ref 3.5–5.2)
Total Bilirubin: 0.9 mg/dL (ref 0.3–1.2)
Total Protein: 6.9 g/dL (ref 6.0–8.3)

## 2011-12-08 LAB — HEPATITIS C RNA QUANTITATIVE: HCV Quantitative Log: 4.96 {Log} — ABNORMAL HIGH (ref <1.63)

## 2011-12-11 NOTE — Progress Notes (Signed)
NAME:  [name], [name]  MR#:  [id number]      DATE:  12/04/2011  DOB:  04-09-1961    cc: Consulting Physician: Gerome Apley, Montez Hageman., PA, [facility], [address] [place], [place] [address], Fax [phone] Primary Care Physician: [facility] [place], [address], [place], [place] [address], [place] [phone] Referring Physician: Jonette Eva, MD, [facility], [address], [place], [place] [address], Fax [phone]    [facility] Medical Record number [id number].   REASON FOR VISIT:  Followup genotype 3a HCV, week 4 of treatment.   HISTORY:  The patient returns today unaccompanied. His date of commencement for peg interferon, ribavirin for genotype 3a HCV with radiographic imaging to suggest cirrhosis was 11/07/2011. He took his fourth injection last Friday, and due for his fifth tomorrow. He is doing well without significant complaints. He has some nausea. Reports headaches are not as bad as they were.   PAST MEDICAL HISTORY:  Significant for type 2 diabetes. Reports his a.m. blood sugar was 170 this morning, was 200 last p.m.   CURRENT MEDICATIONS:  1. Accupril 20 mg b.i.d.  2. Nexium 40 mg b.i.d.  3. Metformin 1000 mg b.i.d.  4. Glucotrol 10 mg b.i.d.  5. Pegasys 180 mcg weekly.   6. Ribavirin 400 mg b.i.d.   ALLERGIES:  Denies.   HABITS:  Smoking never. Alcohol denies interval consumption.   REVIEW OF SYSTEMS:  All 10 systems reviewed today with the patient and they are negative other than which is mentioned above. His CES-D was 19.   PHYSICAL EXAMINATION:  Constitutional: Central obesity. Vital signs: Height 67 inches, weight 284 pounds, blood pressure 152/80, pulse 74, temperature 97.8 Fahrenheit.   LABORATORY DATA:  The labs I ordered for 11/20/2011, were not done.   ASSESSMENT:  The patient is a 51 year old gentleman with history of genotype 3a HCV with radiographic imaging and lab testing to suggest cirrhosis.  He is very motivated. His start was 11/07/2011, and he has completed 4 weeks of interferon and ribavirin therapy with his fifth injection tomorrow. My plan is to treat him for 24 weeks. We will track his week 4 HCV RNA today.   In terms of his cirrhosis care, he was screened for varices on 09/08/2011, by Dr. Darrick Penna at [facility]. This does not show any evidence of varices or portal hypertensive gastropathy. This will need to be repeated in January 2015-2016. His HCC screening was by ultrasound on 10/03/2011, and will need to be repeated in August 2013. He has no encephalopathy or ascites. He has received second hepatitis B vaccine in February 2013, having received his first in January 2013 at [facility]. He will need his final 1 in June-July 2013 locally. He received Pneumovax on 08/20/2011 at [facility]. First hepatitis A vaccine was 04/19/2012, and the second one will need to be done in July 2013. This can be done by Twinrix in July 2013, to give hepatitis A and B.   In my discussion today with the patient, we discussed the side effect management and the need to get his lab testing done as ordered. We discussed the significance of measuring his week 4 HCV RNA today.   PLAN:  1. Needs final Twinrix in July 2013.  2. Ultrasound in August 2013, to be ordered at subsequent visit.  3. Repeat EGD by Dr. Darrick Penna on 08/15/2013-2016.  4. Week 4 HCV RNA today. In addition to liver enzymes and CBC.  5. Return in 2 weeks'  time at week approximately 6.               Brooke Dare, MD   ADDENDUM:  Week 4 viral load 90201 IU/mL  4.96 log IU/mL    12/27/09  591000 IU/mL  5.77 log IU/mL     403 .[id number]  D:  Thu Apr 25 18:59:46 2013 ; T:  Thu Apr 25 23:23:14 2013  Job #:  [id number]

## 2011-12-18 ENCOUNTER — Ambulatory Visit (INDEPENDENT_AMBULATORY_CARE_PROVIDER_SITE_OTHER): Payer: Self-pay | Admitting: Gastroenterology

## 2011-12-18 ENCOUNTER — Other Ambulatory Visit: Payer: Self-pay | Admitting: Gastroenterology

## 2011-12-18 DIAGNOSIS — B182 Chronic viral hepatitis C: Secondary | ICD-10-CM

## 2011-12-19 LAB — CBC WITH DIFFERENTIAL/PLATELET
Hemoglobin: 11.8 g/dL — ABNORMAL LOW (ref 13.0–17.0)
Lymphocytes Relative: 35 % (ref 12–46)
Lymphs Abs: 0.9 10*3/uL (ref 0.7–4.0)
MCH: 29.8 pg (ref 26.0–34.0)
Monocytes Relative: 9 % (ref 3–12)
Neutro Abs: 1.4 10*3/uL — ABNORMAL LOW (ref 1.7–7.7)
Neutrophils Relative %: 54 % (ref 43–77)
RBC: 3.96 MIL/uL — ABNORMAL LOW (ref 4.22–5.81)
WBC: 2.6 10*3/uL — ABNORMAL LOW (ref 4.0–10.5)

## 2011-12-19 LAB — HEPATITIS C RNA QUANTITATIVE
HCV Quantitative Log: 4 {Log} — ABNORMAL HIGH (ref <1.63)
HCV Quantitative: 10083 IU/mL — ABNORMAL HIGH (ref <43)

## 2011-12-25 ENCOUNTER — Ambulatory Visit: Payer: Self-pay | Admitting: Gastroenterology

## 2012-01-01 ENCOUNTER — Ambulatory Visit (INDEPENDENT_AMBULATORY_CARE_PROVIDER_SITE_OTHER): Payer: Self-pay | Admitting: Gastroenterology

## 2012-01-01 DIAGNOSIS — B182 Chronic viral hepatitis C: Secondary | ICD-10-CM

## 2012-01-01 LAB — CBC WITH DIFFERENTIAL/PLATELET
Hemoglobin: 11.4 g/dL — ABNORMAL LOW (ref 13.0–17.0)
Lymphs Abs: 0.8 10*3/uL (ref 0.7–4.0)
Monocytes Relative: 14 % — ABNORMAL HIGH (ref 3–12)
Neutro Abs: 1.2 10*3/uL — ABNORMAL LOW (ref 1.7–7.7)
Neutrophils Relative %: 50 % (ref 43–77)
Platelets: 67 10*3/uL — ABNORMAL LOW (ref 150–400)
RBC: 3.82 MIL/uL — ABNORMAL LOW (ref 4.22–5.81)
WBC: 2.3 10*3/uL — ABNORMAL LOW (ref 4.0–10.5)

## 2012-01-01 LAB — HEPATIC FUNCTION PANEL
AST: 172 U/L — ABNORMAL HIGH (ref 0–37)
Albumin: 3.9 g/dL (ref 3.5–5.2)
Alkaline Phosphatase: 107 U/L (ref 39–117)
Bilirubin, Direct: 0.2 mg/dL (ref 0.0–0.3)
Total Bilirubin: 0.8 mg/dL (ref 0.3–1.2)

## 2012-01-01 NOTE — Patient Instructions (Signed)
You will need your final Twnrix vaccine for Hepatitis A and B in July 2013 at the [facility] Department.

## 2012-01-01 NOTE — Progress Notes (Signed)
   NAME:  [name], [name]  MR#:  [id number]      DATE:  12/19/2011  DOB:  08/02/1961    cc: Consulting Physician: Gerome Apley, Montez Hageman., PA, [facility], [address], [place], [place] [address], Fax [phone]  Primary Care Physician: [facility] [place], [address] [place], [place] [address], [place] [phone]  Referring Physician: Jonette Eva, MD, [facility], [address] [place], [place] [address], Fax [phone]    [facility] Medical Record Number [id number]  REASON FOR VISIT:  Follow up genotype 3a HCV, week 6 of treatment.   HISTORY:  The patient returns today unaccompanied. He reports that he has a rash, which improves with topical lotions. He is not that troubled by it and thinks the lotion is working. There are no symptoms to suggest decompensated or cryoglobulin mediated disease.   PAST MEDICAL HISTORY:  Significant for type 2 diabetes. He is monitoring his a.m. blood sugars on treatment.   CURRENT MEDICATIONS:  1. Accupril 20 mg b.i.d.  2. Nexium 40 mg b.i.d.  3. Metformin 1000 mg b.i.d.  4. Glucotrol 10 mg b.i.d.  5. Pegasys 180 mcg weekly.  6. Ribavirin 400 mg b.i.d.   ALLERGIES:  Denies.   HABITS:  Smoking never. Alcohol denies interval consumption.   REVIEW OF SYSTEMS:  All 10 systems reviewed today with the patient and they are negative other than which is mentioned above. His CES-D was incomplete.   PHYSICAL EXAMINATION:  Constitutional: Central obesity. Vital signs: Height 66 inches, weight 285 pounds, blood pressure 173/77, pulse 74, temperature 98 Fahrenheit.   ASSESSMENT:  The patient is a 51 year old gentleman with history of genotype 3a hepatitis C with radiographic imaging and lab testing to suggest cirrhosis. His start date for Pegasys, ribavirin was 11/07/2011. His week 4 HCV RNA was 90,201 international units per mL, or 4.96 log international units per mL. Obviously, this would be better  if it was undetectable. I would like to repeat his HCV RNA today to see what the trend is. Consideration could be given to discontinuation of therapy, if it has failed to decline further, however, if it is declining, consideration could be given to extending therapy to at least 36 weeks beyond the first negative HCV RNA.   In my discussion today with the patient, we discussed symptom management and his previous lab work. I explained the results of his previous HCV RNA.   PLAN:  1. CBC today.  2. HCV RNA today.  3. Return to clinic in 2 weeks' time.  4. Final Twinrix due in July 2013.  5. August ultrasound due in August 2013.  6. Repeat EGD by Dr. Darrick Penna, 08/15/2013-2016.                Brooke Dare, MD    ADDENDUM  Week 6 HCV RNA 10083 IU/mL 4 log IU/mL  403 .20947  D:  Thu May 09 20:20:39 2013 ; T:  Fri May 10 00:34:11 2013  Job #:  [id number]

## 2012-01-05 LAB — HEPATITIS C RNA QUANTITATIVE: HCV Quantitative: 3774 IU/mL — ABNORMAL HIGH (ref <43)

## 2012-01-08 NOTE — Progress Notes (Signed)
NAME:  [name], [name]  MR#:  [id number]      DATE:  01/01/2012  DOB:  01-01-1961    cc: Consulting Physician: Gerome Apley, Montez Hageman., PA, [facility], [address], [place], [place] [address], Fax [phone]   Primary Care Physician: [facility], [address] [place], [place] [address], [place] [phone]   Referring Physician: Jonette Eva, MD, [facility], [address], [place], [place] [address], Fax [phone]     [facility] medical record number:  [id number]  REASON FOR VISIT:  Followup of genotype 3a hepatitis C virus, week 8 week of treatment.   HISTORY: The patient returns today unaccompanied. His date for commencement of PEG-interferon and ribavirin for genotype 3a hepatitis C, with radiographic imaging to suggest cirrhosis, is 11/07/2011. He is due to take his eighth injection tomorrow. He reports that his rash is improved. There no other significant complaints. There are no symptoms or symptoms to suggest decompensated liver disease.   PAST MEDICAL HISTORY: Significant for type 2 diabetes. He reports his blood sugars are well controlled. He will be seen on 01/18/2012 at the free clinic of [facility] for followup of his diabetes.   CURRENT MEDICATIONS:  1. Ribavirin 400 mg b.i.d.  2. Pegasys 180 mcg weekly. 3. Glucotrol 10 mg b.i.d.  4. Metformin 1000 mg b.i.d.  5. Nexium 40 mg b.i.d.  6. Accupril 20 mg b.i.d.   ALLERGIES: Denies.   HABITS: Smoking: Never. Alcohol: Denies interval consumption.   REVIEW OF SYSTEMS: All 10 systems reviewed today with the patient are negative other than which was mentioned above. His CES-D was incomplete.   physical examination:  Constitutional: Central obesity, but otherwise appeared well. Vital signs: Height 66 inches, weight 283 pounds, down 1 pound from before, blood pressure 164/84, pulse 79, temperature 96.5 Fahrenheit.   ASSESSMENT: The patient is a 51 year old  gentleman with a history of genotype 3a hepatitis C with radiographic imaging and lab testing to suggest cirrhosis. His start date was 11/07/2011. He has completed approximately 8 weeks of interferon and ribavirin. His eighth injection is tomorrow. I plan to treat him for at least 24 weeks. His week 4 HCV RNA was 90,201 international units per mL with a baseline of 591,000 international units per mL, or 5.77 logs. Therefore, he has only declined 0.81 log in week 4.  At approximately week 6, on 12/18/2011, his viral load had fallen to 10,083 international units per mL, or 4.00 log international units per mL. This would represent only a 0.96 log decline. Each time his viral load is declining by just less than 1 log. I am concerned that he may not be responding. We will need to check his week 8 HCV RNA now.   Otherwise, he is tolerating therapy quite well. If he is indeed negative today, we may consider extending therapy to at least week 34 (i.e., the 36 weeks beyond the first negative HCV RNA).   In terms of his cirrhosis care, he was screened for varices on 09/08/2011 by Dr. Darrick Penna at [facility]. This should be repeated between 08/2013 and 08/2014. His HCC screening was by ultrasound on 10/03/2011, and it needs to be repeated in 03/2012. There is no ascites or encephalopathy to treat. He received his hepatitis B vaccination starting 08/2011 at [facility]. He will need his final in 02/2012 locally. He received Pneumovax 08/20/2011 at [facility]. His first hepatitis A vaccine was given 08/20/2011 and his second will need to be repeated  in 02/2012 along with hepatitis B vaccine.   In my discussion today with the patient, we discussed his previous viral loads and their implications. We agree that we will continue on therapy. I warned him that he may need to continue on therapy beyond 24 weeks. He was in agreement for this.   PLAN:  1. Final Twinrix in 02/2012.  2. Ultrasound 03/2012 to be ordered at subsequent visit.   3. Repeat EGD by Dr. Darrick Penna 08/15/2014. 4. Week 8 HCV RNA drawn today.  5. He is to return in 1 month's time in followup.               Brooke Dare, MD   ADDENDUM:  Week 8 viral load 3774  IU/mL  3.58 log IU/mL.  If remains positive at week 12, will consider stopping.  403 .[id number]  D:  Thu May 23 21:05:52 2013 ; T:  Fri May 24 12:03:27 2013  Job #:  [id number]

## 2012-01-14 ENCOUNTER — Telehealth: Payer: Self-pay | Admitting: Gastroenterology

## 2012-01-14 NOTE — Telephone Encounter (Signed)
Jacquelin Hawking, PA, from [facility] called to report that she had seen [name] today and he had significant irritability.  She gave him a prescription for citalopram, but wanted to discuss with me.  I spoke with her and decided to discontinue treatment considering the lack of significant further decline in his viral load at his week 8 HCV RNA determination.  I called and discussed with Cory and [name].  They agreed to stop.  They will keep their next appt to discuss with me in person.  He may benefit from sofosbuvir/Pegasys/Ribavirin if possible to obtain it when sofosbuvir is released.

## 2012-01-29 ENCOUNTER — Ambulatory Visit: Payer: Self-pay | Admitting: Gastroenterology

## 2012-03-18 ENCOUNTER — Other Ambulatory Visit: Payer: Self-pay

## 2012-03-19 MED ORDER — ESOMEPRAZOLE MAGNESIUM 40 MG PO CPDR: 40.0000 mg | DELAYED_RELEASE_CAPSULE | Freq: Two times a day (BID) | ORAL | Status: DC

## 2012-05-24 ENCOUNTER — Encounter (HOSPITAL_COMMUNITY): Payer: Self-pay | Admitting: Oncology

## 2012-05-24 ENCOUNTER — Encounter (HOSPITAL_COMMUNITY): Payer: Self-pay | Attending: Oncology | Admitting: Oncology

## 2012-05-24 VITALS — BP 131/67 | HR 56 | Temp 97.6°F | Resp 18 | Ht 69.0 in | Wt 282.0 lb

## 2012-05-24 DIAGNOSIS — D61818 Other pancytopenia: Secondary | ICD-10-CM | POA: Insufficient documentation

## 2012-05-24 DIAGNOSIS — B1920 Unspecified viral hepatitis C without hepatic coma: Secondary | ICD-10-CM | POA: Insufficient documentation

## 2012-05-24 DIAGNOSIS — E119 Type 2 diabetes mellitus without complications: Secondary | ICD-10-CM | POA: Insufficient documentation

## 2012-05-24 DIAGNOSIS — R21 Rash and other nonspecific skin eruption: Secondary | ICD-10-CM | POA: Insufficient documentation

## 2012-05-24 DIAGNOSIS — E669 Obesity, unspecified: Secondary | ICD-10-CM | POA: Insufficient documentation

## 2012-05-24 LAB — CBC WITH DIFFERENTIAL/PLATELET
Eosinophils Absolute: 0.2 10*3/uL (ref 0.0–0.7)
Hemoglobin: 11.9 g/dL — ABNORMAL LOW (ref 13.0–17.0)
Lymphocytes Relative: 25 % (ref 12–46)
Lymphs Abs: 1.5 10*3/uL (ref 0.7–4.0)
MCH: 30.7 pg (ref 26.0–34.0)
MCV: 90.7 fL (ref 78.0–100.0)
Monocytes Relative: 14 % — ABNORMAL HIGH (ref 3–12)
Neutrophils Relative %: 59 % (ref 43–77)
Platelets: 114 10*3/uL — ABNORMAL LOW (ref 150–400)
RBC: 3.88 MIL/uL — ABNORMAL LOW (ref 4.22–5.81)
WBC: 6.1 10*3/uL (ref 4.0–10.5)

## 2012-05-24 NOTE — Progress Notes (Signed)
[name] presented for [employer]. Labs per MD order drawn via Peripheral Line 23 gauge needle inserted in lt ac.  Good blood return present. Procedure without incident.  Needle removed intact. Patient tolerated procedure well.

## 2012-05-24 NOTE — Patient Instructions (Signed)
[facility]  Discharge Instructions  RECOMMENDATIONS MADE BY THE CONSULTANT AND ANY TEST RESULTS WILL BE SENT TO YOUR REFERRING DOCTOR.   EXAM FINDINGS BY MD TODAY AND SIGNS AND SYMPTOMS TO REPORT TO CLINIC OR PRIMARY MD: Exam per Dr. Mariel Sleet  INSTRUCTIONS GIVEN AND DISCUSSED: Labs in 4 months then to see PA  SPECIAL INSTRUCTIONS/FOLLOW-UP: Labs today   I acknowledge that I have been informed and understand all the instructions given to me and received a copy. I do not have any more questions at this time, but understand that I may call the Specialty Clinic at [facility] at [phone] during business hours should I have any further questions or need assistance in obtaining follow-up care.    __________________________________________  _____________  __________ Signature of Patient or Authorized Representative            Date                   Time    __________________________________________ Nurse's Signature

## 2012-05-24 NOTE — Progress Notes (Signed)
Problem #1 pancytopenia, multifactorial. Problem #2 hepatitis C viral infection Problem #3 diabetes mellitus on therapy Problem #4 obesity Problem #5 poor dental hygiene Problem #6 skin rash in each axilla felt to be most likely from dermophyte infection Problem #7 history of IV cocaine use in the distant past Problem #8 history of alcoholism having quit drinking 5 years ago   Is a very nice 51 year old gentleman who looks older than his stated age who for 5 years has no he has cirrhosis of the liver. He was diagnosed with hepatitis C treated and most recently with interferon and ribavirin. It was not as effective as the gastroenterologist from [facility] wanted to be so stopped.  The patient is accompanied by his wife of 20 years. He has a daughter by a prior relationship in good health to the best of his knowledge. Both of his parents are deceased his father from metastatic melanoma and his mother from emphysema. He is no longer able to be employed. He never smoked.   He has had an ultrasound of his abdomen earlier this year which was negative for evidence for carcinoma of the liver and his alpha-fetoprotein level was checked earlier this year. It was slightly elevated at 12.4. It probably needs to be repeated in 4-6 months and at any higher I would recommend CT scan of his abdomen with contrast.   He is alert and oriented. Vital signs recorded. His weight today is 282 pounds and his maximum weight he states was over 320 pounds. He has no obvious lymphadenopathy. He has a rash under both axilla extending anteriorly consistent with a fungal infection. He has no thyromegaly. His a few remaining teeth which are in poor repair. Tongue is unremarkable. Throat is clear. He is not jaundiced. Pupils appear to be equally round and reactive to light. He has a benign appearance skin lesion' 3-4 mm across the right upper eye lid. His been present for some time according to his wife. She does not feel that  his change. He has clear lung fields. His heart shows a regular rhythm and rate without murmur rub or gallop. Abdomen is obese with a sense of splenomegaly but difficult to feel the edge distinctly. His right upper quadrant also feels somewhat full without the ability to feel his liver edge. He has an obese abdomen as mentioned. I did not detect definitive ascites. He did not have ankle edema present today. Pulses in his feet were trace to 1+. Nails were unremarkable. He did not have palmar erythema. He did not have obvious spider angiomata.  I suspect this gentleman is pancytopenia is most likely from toxicity from his hepatitis C virus activity and to splenomegaly. I will check him for B12 deficiency, folic acid deficiency, iron deficiency since sometimes her nutrition is poor and he can lose blood from the varices. We will bring him back in 4 months. If he has not had a alpha-fetoprotein repeated by then we can repeat it at that time.

## 2012-05-25 LAB — FERRITIN: Ferritin: 20 ng/mL — ABNORMAL LOW (ref 22–322)

## 2012-05-25 LAB — VITAMIN B12: Vitamin B-12: 863 pg/mL (ref 211–911)

## 2012-05-25 LAB — IRON AND TIBC
Iron: 86 ug/dL (ref 42–135)
Saturation Ratios: 18 % — ABNORMAL LOW (ref 20–55)
TIBC: 471 ug/dL — ABNORMAL HIGH (ref 215–435)
UIBC: 385 ug/dL (ref 125–400)

## 2012-05-26 ENCOUNTER — Telehealth (HOSPITAL_COMMUNITY): Payer: Self-pay | Admitting: *Deleted

## 2012-05-26 NOTE — Telephone Encounter (Signed)
Patient states he has never taken iron before. He is willing to try IV or PO iron.

## 2012-05-28 ENCOUNTER — Other Ambulatory Visit (HOSPITAL_COMMUNITY): Payer: Self-pay | Admitting: *Deleted

## 2012-05-28 DIAGNOSIS — D509 Iron deficiency anemia, unspecified: Secondary | ICD-10-CM

## 2012-06-04 ENCOUNTER — Encounter (HOSPITAL_BASED_OUTPATIENT_CLINIC_OR_DEPARTMENT_OTHER): Payer: Self-pay

## 2012-06-04 VITALS — BP 112/72 | HR 60 | Temp 97.3°F | Resp 18

## 2012-06-04 DIAGNOSIS — D509 Iron deficiency anemia, unspecified: Secondary | ICD-10-CM

## 2012-06-04 MED ORDER — SODIUM CHLORIDE 0.9 % IJ SOLN
10.0000 mL | Freq: Once | INTRAMUSCULAR | Status: DC
  Filled 2012-06-04: qty 10

## 2012-06-04 MED ORDER — SODIUM CHLORIDE 0.9 % IV SOLN
Freq: Once | INTRAVENOUS | Status: AC
  Administered 2012-06-04: 10:00:00 via INTRAVENOUS

## 2012-06-04 MED ORDER — SODIUM CHLORIDE 0.9 % IV SOLN
1020.0000 mg | Freq: Once | INTRAVENOUS | Status: AC
  Administered 2012-06-04: 1020 mg via INTRAVENOUS
  Filled 2012-06-04: qty 34

## 2012-06-04 MED ORDER — SODIUM CHLORIDE 0.9 % IJ SOLN
INTRAMUSCULAR | Status: AC
  Filled 2012-06-04: qty 10

## 2012-07-16 ENCOUNTER — Other Ambulatory Visit (HOSPITAL_COMMUNITY): Payer: Self-pay

## 2012-07-19 ENCOUNTER — Other Ambulatory Visit: Payer: Self-pay

## 2012-07-19 ENCOUNTER — Encounter (HOSPITAL_COMMUNITY): Payer: Self-pay | Attending: Oncology

## 2012-07-19 DIAGNOSIS — K7460 Unspecified cirrhosis of liver: Secondary | ICD-10-CM

## 2012-07-19 DIAGNOSIS — D61818 Other pancytopenia: Secondary | ICD-10-CM | POA: Insufficient documentation

## 2012-07-19 DIAGNOSIS — D509 Iron deficiency anemia, unspecified: Secondary | ICD-10-CM

## 2012-07-19 DIAGNOSIS — E669 Obesity, unspecified: Secondary | ICD-10-CM | POA: Insufficient documentation

## 2012-07-19 DIAGNOSIS — B1920 Unspecified viral hepatitis C without hepatic coma: Secondary | ICD-10-CM | POA: Insufficient documentation

## 2012-07-19 DIAGNOSIS — E119 Type 2 diabetes mellitus without complications: Secondary | ICD-10-CM | POA: Insufficient documentation

## 2012-07-19 DIAGNOSIS — R21 Rash and other nonspecific skin eruption: Secondary | ICD-10-CM | POA: Insufficient documentation

## 2012-07-19 LAB — CBC
HCT: 37.2 % — ABNORMAL LOW (ref 39.0–52.0)
Hemoglobin: 12.7 g/dL — ABNORMAL LOW (ref 13.0–17.0)
MCHC: 34.1 g/dL (ref 30.0–36.0)
RBC: 4.06 MIL/uL — ABNORMAL LOW (ref 4.22–5.81)
WBC: 3.6 10*3/uL — ABNORMAL LOW (ref 4.0–10.5)

## 2012-07-19 LAB — COMPREHENSIVE METABOLIC PANEL
AST: 281 U/L — ABNORMAL HIGH (ref 0–37)
Albumin: 3.2 g/dL — ABNORMAL LOW (ref 3.5–5.2)
Alkaline Phosphatase: 117 U/L (ref 39–117)
BUN: 15 mg/dL (ref 6–23)
Creat: 0.67 mg/dL (ref 0.50–1.35)
Glucose, Bld: 174 mg/dL — ABNORMAL HIGH (ref 70–99)
Total Bilirubin: 1.6 mg/dL — ABNORMAL HIGH (ref 0.3–1.2)

## 2012-07-19 NOTE — Progress Notes (Signed)
CBC is ordered by Dr. Mariel Sleet. Pt is going there today. I am faxing these orders to them. LMOM at Dr. Renford Dills that I would be faxing the lab orders over.

## 2012-07-19 NOTE — Progress Notes (Signed)
Labs drawn today for cbc,ferr 

## 2012-07-20 ENCOUNTER — Telehealth: Payer: Self-pay

## 2012-07-20 LAB — FERRITIN: Ferritin: 224 ng/mL (ref 22–322)

## 2012-07-20 NOTE — Telephone Encounter (Addendum)
T/C from [place] at [place]. She had questions about orders for labs that were drawn at [facility] yesterday. Dr. Darrick Penna ordered the CMP, AFP, PT/INR. Cancer Center had orders for CBC and Ferritin. They did not get a tube for the PT/INR. Also, they wonder if the CBC was done in house. I told [name] I would check with the Cancer Center. I called and got VM and left VM to call.   [name]'s call back number is [phone].

## 2012-07-21 LAB — PROTIME-INR

## 2012-07-26 ENCOUNTER — Other Ambulatory Visit: Payer: Self-pay

## 2012-07-26 DIAGNOSIS — K7460 Unspecified cirrhosis of liver: Secondary | ICD-10-CM

## 2012-07-26 NOTE — Progress Notes (Signed)
Tried to call pt. Line busy x 2. Lab order faxed to [facility].

## 2012-07-26 NOTE — Progress Notes (Addendum)
PLEASE CALL PT. HIS LABS ARE UNCHANGED.  HE STILL NEEDS A PT/INR IN DEC 2013.& FAX ORDER TO LAB.

## 2012-07-28 NOTE — Progress Notes (Signed)
Called and informed pt's wife, She is aware lab order has been faxed to [facility].

## 2012-09-24 ENCOUNTER — Other Ambulatory Visit (HOSPITAL_COMMUNITY): Payer: Self-pay

## 2012-09-27 ENCOUNTER — Ambulatory Visit (HOSPITAL_COMMUNITY): Payer: Self-pay | Admitting: Oncology

## 2012-09-30 ENCOUNTER — Ambulatory Visit (HOSPITAL_COMMUNITY): Payer: Self-pay | Admitting: Oncology

## 2012-09-30 ENCOUNTER — Other Ambulatory Visit (HOSPITAL_COMMUNITY): Payer: Self-pay

## 2012-10-07 ENCOUNTER — Encounter (HOSPITAL_COMMUNITY): Payer: Self-pay | Admitting: Oncology

## 2012-10-07 ENCOUNTER — Encounter (HOSPITAL_COMMUNITY): Payer: Self-pay | Attending: Oncology | Admitting: Oncology

## 2012-10-07 ENCOUNTER — Encounter (HOSPITAL_BASED_OUTPATIENT_CLINIC_OR_DEPARTMENT_OTHER): Payer: Self-pay

## 2012-10-07 VITALS — BP 137/73 | HR 57 | Temp 97.5°F | Resp 22 | Wt 286.2 lb

## 2012-10-07 DIAGNOSIS — D61818 Other pancytopenia: Secondary | ICD-10-CM | POA: Insufficient documentation

## 2012-10-07 DIAGNOSIS — B1920 Unspecified viral hepatitis C without hepatic coma: Secondary | ICD-10-CM

## 2012-10-07 DIAGNOSIS — R21 Rash and other nonspecific skin eruption: Secondary | ICD-10-CM

## 2012-10-07 DIAGNOSIS — E119 Type 2 diabetes mellitus without complications: Secondary | ICD-10-CM

## 2012-10-07 DIAGNOSIS — B182 Chronic viral hepatitis C: Secondary | ICD-10-CM | POA: Insufficient documentation

## 2012-10-07 DIAGNOSIS — K7460 Unspecified cirrhosis of liver: Secondary | ICD-10-CM | POA: Insufficient documentation

## 2012-10-07 LAB — COMPREHENSIVE METABOLIC PANEL
ALT: 233 U/L — ABNORMAL HIGH (ref 0–53)
AST: 292 U/L — ABNORMAL HIGH (ref 0–37)
CO2: 24 mEq/L (ref 19–32)
Chloride: 102 mEq/L (ref 96–112)
Creatinine, Ser: 0.69 mg/dL (ref 0.50–1.35)
GFR calc Af Amer: >90 mL/min (ref >90)
GFR calc non Af Amer: >90 mL/min (ref >90)
Glucose, Bld: 228 mg/dL — ABNORMAL HIGH (ref 70–99)
Total Bilirubin: 1.4 mg/dL — ABNORMAL HIGH (ref 0.3–1.2)

## 2012-10-07 LAB — CBC WITH DIFFERENTIAL/PLATELET
Basophils Absolute: 0 10*3/uL (ref 0.0–0.1)
Eosinophils Relative: 3 % (ref 0–5)
HCT: 36.7 % — ABNORMAL LOW (ref 39.0–52.0)
Hemoglobin: 12.7 g/dL — ABNORMAL LOW (ref 13.0–17.0)
Lymphocytes Relative: 28 % (ref 12–46)
Lymphs Abs: 1.1 10*3/uL (ref 0.7–4.0)
MCV: 93.4 fL (ref 78.0–100.0)
Monocytes Absolute: 0.5 10*3/uL (ref 0.1–1.0)
Monocytes Relative: 13 % — ABNORMAL HIGH (ref 3–12)
Neutro Abs: 2.2 10*3/uL (ref 1.7–7.7)
RBC: 3.93 MIL/uL — ABNORMAL LOW (ref 4.22–5.81)
RDW: 13.7 % (ref 11.5–15.5)
WBC: 4 10*3/uL (ref 4.0–10.5)

## 2012-10-07 NOTE — Progress Notes (Signed)
No primary provider on file. No primary provider on file.  HEPATITIS C, CHRONIC  CIRRHOSIS  CURRENT THERAPY: Observation  INTERVAL HISTORY: [name] 53 y.o. 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  [name] is here for followup. His labs are reviewed. I personally reviewed and went over laboratory results with the patient.  His hemoglobin is stable in the 12 g/dL range, platelets are stable ranging between 70,000 and 115,000. His white blood cell count is within normal limits most recently at 4.0.    [name] is hepatologist is Dr. Jacqualine Mau and he reports he has not heard from him in approximately 8 months time. He thinks he has misplaced Dr. Jacqualine Mau' office number. I will provide that telephone number to him today. This will be added to his discharge instructions.  Patient's alpha-fetoprotein is pending, but if it is higher than his December level, we will perform CT of abdomen and pelvis with contrast to evaluate for hepatocellular carcinoma.  He really denies any complaints hematologically. He denies any bleeding or increased infections requiring antibiotics. His hemoglobin is solid. ROS questioning is otherwise negative.   Past Medical History  Diagnosis Date  . Hypertension   . Diabetes mellitus   . GERD (gastroesophageal reflux disease)     DEC 2010 EGD/Bx REACTIVE GASTROPATHY, NO VARICES  . Hemorrhoids, internal   . BMI 40.0-44.9, adult OCT 2010 269 LBS    APR 2012 279 LBS  . 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 IV drug abuse REMOTE  . Hepatitis 2010 HEP C    AST 509 ALT 267 ALK PHOS 165 ALB 3.8 NEG IGM HAV/HBSAg  . Gallstone AUG 2012 1 CM  . GERD 10/25/2009  . COPD (chronic obstructive pulmonary disease)   . Hepatitis C   . Pancytopenia 2013  . Splenomegaly 2013    has HEPATITIS C, CHRONIC; DM; ALCOHOL ABUSE; GERD; CIRRHOSIS; UNSPECIFIED DISEASE OF PANCREAS; FATIGUE; LIVER FUNCTION TESTS, ABNORMAL, HX OF;  ABDOMINAL PAIN, CHRONIC; Gallstone; and Colon cancer screening on his problem list.     has No Known Allergies.  [name] does not currently have medications on file.  Past Surgical History  Procedure Laterality Date  . Sigmoidoscopy      2001 DR. FLEISCHMAN INTERNAL HERMORRHOIDS  . Upper gastrointestinal endoscopy  DEC 2010    BENIGN POLYPS, GASTRITIS, ?phg  . Knee surgery  RIGHT  . Hemorrhoid surgery    . Esophageal biopsy  09/08/2011    Procedure: BIOPSY;  Surgeon: Arlyce Harman, MD;  Location: [facility];  Service: Endoscopy;;  Gastric Biopsies and Gastric Polyp Biopsy    Denies any headaches, dizziness, double vision, fevers, chills, night sweats, nausea, vomiting, diarrhea, constipation, chest pain, heart palpitations, shortness of breath, blood in stool, black tarry stool, urinary pain, urinary burning, urinary frequency, hematuria.   PHYSICAL EXAMINATION  ECOG PERFORMANCE STATUS: 1 - Symptomatic but completely ambulatory  Filed Vitals:   10/07/12 1200  BP: 137/73  Pulse: 57  Temp: 97.5 F (36.4 C)  Resp: 22    GENERAL:alert, comfortable, cooperative, obese, smiling and chronically-ill appearing SKIN: skin color, texture, turgor are normal, no rashes or significant lesions HEAD: Normocephalic, No masses, lesions, tenderness or abnormalities EYES: normal, Conjunctiva are pink and non-injected EARS: External ears normal OROPHARYNX:mucous membranes are moist and poor dentition  NECK: supple, no adenopathy, thyroid normal size, non-tender, without nodularity, no stridor, non-tender, trachea midline LYMPH:  no palpable lymphadenopathy  BREAST:not examined LUNGS: clear to auscultation and percussion HEART: regular rate & rhythm, no murmurs, no gallops, S1 normal and S2 normal ABDOMEN:abdomen soft, non-tender, central obesity, normal bowel sounds and no masses or organomegaly, but RUQ and LUQ fullness BACK: Back symmetric, no curvature., No CVA tenderness EXTREMITIES:less  then 2 second capillary refill, no joint deformities, effusion, or inflammation, no skin discoloration, no cyanosis  NEURO: alert & oriented x 3 with fluent speech, no focal motor/sensory deficits, gait normal    LABORATORY DATA: CBC    Component Value Date/Time   WBC 4.0 10/07/2012 1155   RBC 3.93* 10/07/2012 1155   HGB 12.7* 10/07/2012 1155   HCT 36.7* 10/07/2012 1155   PLT 73* 10/07/2012 1155   MCV 93.4 10/07/2012 1155   MCH 32.3 10/07/2012 1155   MCHC 34.6 10/07/2012 1155   RDW 13.7 10/07/2012 1155   LYMPHSABS 1.1 10/07/2012 1155   MONOABS 0.5 10/07/2012 1155   EOSABS 0.1 10/07/2012 1155   BASOSABS 0.0 10/07/2012 1155     PENDING LABS: AFP    ASSESSMENT:  1. Pancytopenia, multifactorial.  2. Hepatitis C viral infection  3. Diabetes mellitus on therapy  4. Obesity  5. Poor dental hygiene  6. Skin rash in each axilla felt to be most likely from dermophyte infection  7. History of IV cocaine use in the distant past  8. History of alcoholism having quit drinking 5 years ago   PLAN:  1. I personally reviewed and went over laboratory results with the patient. 2. AFP today 3. Labs in 4 months: CBC diff, Iron/TIBC, Ferritin, AFP 4. Recommended the patient contact Dr. Jacqualine Mau.  Office number provided to the patient today. 5. If AFP is higher today, will pursue a CT abd/pelvis with contrast to evaluate liver and evaluate for hepatocellular carcinoma.  6. Return in 4 month   All questions were answered. The patient knows to call the clinic with any problems, questions or concerns. We can certainly see the patient much sooner if necessary.  Patient and plan will be discussed with Dr. Mariel Sleet within the next 24 hours.    [name]

## 2012-10-07 NOTE — Progress Notes (Signed)
Labs drawn today for cbc/diff,cmp 

## 2012-10-07 NOTE — Patient Instructions (Addendum)
[facility] Discharge Instructions  RECOMMENDATIONS MADE BY THE CONSULTANT AND ANY TEST RESULTS WILL BE SENT TO YOUR REFERRING PHYSICIAN.  Lab work today. We will call if there are any abnormal results. We will schedule you for lab work here again in 4 months and then to see the doctor. You need to get in touch with Dr.Z[facility] # [phone]                                                                 [facility] # [phone] Report any issues/concerns to clinic as needed prior to your appointment.  Thank you for choosing [facility] to provide your oncology and hematology care.  To afford each patient quality time with our providers, please arrive at least 15 minutes before your scheduled appointment time.  With your help, our goal is to use those 15 minutes to complete the necessary work-up to ensure our physicians have the information they need to help with your evaluation and healthcare recommendations.    Effective January 1st, 2014, we ask that you re-schedule your appointment with our physicians should you arrive 10 or more minutes late for your appointment.  We strive to give you quality time with our providers, and arriving late affects you and other patients whose appointments are after yours.    Again, thank you for choosing [facility]  Our hope is that these requests will decrease the amount of time that you wait before being seen by our physicians.       _____________________________________________________________  Should you have questions after your visit to [facility], please contact our office at [phone] between the hours of 8:30 a.m. and 5:00 p.m.  Voicemails left after 4:30 p.m. will not be returned until the following business day.  For prescription refill requests, have your pharmacy contact our office with your prescription refill request.

## 2012-10-12 ENCOUNTER — Other Ambulatory Visit (HOSPITAL_COMMUNITY): Payer: Self-pay | Admitting: Oncology

## 2012-10-12 DIAGNOSIS — R5381 Other malaise: Secondary | ICD-10-CM

## 2012-10-12 DIAGNOSIS — Z862 Personal history of diseases of the blood and blood-forming organs and certain disorders involving the immune mechanism: Secondary | ICD-10-CM

## 2012-10-12 DIAGNOSIS — R5383 Other fatigue: Secondary | ICD-10-CM

## 2012-10-12 DIAGNOSIS — B182 Chronic viral hepatitis C: Secondary | ICD-10-CM

## 2012-10-12 DIAGNOSIS — K7460 Unspecified cirrhosis of liver: Secondary | ICD-10-CM

## 2012-10-12 DIAGNOSIS — R109 Unspecified abdominal pain: Secondary | ICD-10-CM

## 2012-10-12 DIAGNOSIS — Z8639 Personal history of other endocrine, nutritional and metabolic disease: Secondary | ICD-10-CM

## 2012-10-12 DIAGNOSIS — R772 Abnormality of alphafetoprotein: Secondary | ICD-10-CM

## 2012-10-12 NOTE — Progress Notes (Signed)
CT ordered. 

## 2012-10-13 ENCOUNTER — Other Ambulatory Visit (HOSPITAL_COMMUNITY): Payer: Self-pay | Admitting: Oncology

## 2012-10-13 ENCOUNTER — Ambulatory Visit (HOSPITAL_COMMUNITY)
Admission: RE | Admit: 2012-10-13 | Discharge: 2012-10-13 | Disposition: A | Discharge status: Home / Self Care | Payer: Self-pay | Source: Ambulatory Visit | Attending: Oncology | Admitting: Oncology

## 2012-10-13 DIAGNOSIS — Z862 Personal history of diseases of the blood and blood-forming organs and certain disorders involving the immune mechanism: Secondary | ICD-10-CM

## 2012-10-13 DIAGNOSIS — R772 Abnormality of alphafetoprotein: Secondary | ICD-10-CM

## 2012-10-13 DIAGNOSIS — R109 Unspecified abdominal pain: Secondary | ICD-10-CM

## 2012-10-13 DIAGNOSIS — K7460 Unspecified cirrhosis of liver: Secondary | ICD-10-CM | POA: Insufficient documentation

## 2012-10-13 DIAGNOSIS — B182 Chronic viral hepatitis C: Secondary | ICD-10-CM

## 2012-10-13 DIAGNOSIS — R161 Splenomegaly, not elsewhere classified: Secondary | ICD-10-CM | POA: Insufficient documentation

## 2012-10-13 DIAGNOSIS — R5381 Other malaise: Secondary | ICD-10-CM

## 2012-10-13 DIAGNOSIS — K8020 Calculus of gallbladder without cholecystitis without obstruction: Secondary | ICD-10-CM | POA: Insufficient documentation

## 2012-10-13 MED ORDER — IOHEXOL 300 MG/ML  SOLN
100.0000 mL | Freq: Once | INTRAMUSCULAR | Status: AC | PRN
  Administered 2012-10-13: 100 mL via INTRAVENOUS

## 2012-10-20 ENCOUNTER — Other Ambulatory Visit (HOSPITAL_COMMUNITY): Payer: Self-pay

## 2013-01-25 ENCOUNTER — Other Ambulatory Visit: Payer: Self-pay

## 2013-01-26 ENCOUNTER — Other Ambulatory Visit: Payer: Self-pay | Admitting: Urgent Care

## 2013-01-27 MED ORDER — ESOMEPRAZOLE MAGNESIUM 40 MG PO CPDR: 40.0000 mg | DELAYED_RELEASE_CAPSULE | Freq: Two times a day (BID) | ORAL | Status: DC

## 2013-02-04 ENCOUNTER — Other Ambulatory Visit (HOSPITAL_COMMUNITY): Payer: Self-pay

## 2013-02-07 ENCOUNTER — Encounter (HOSPITAL_COMMUNITY): Payer: Self-pay | Attending: Oncology

## 2013-02-07 DIAGNOSIS — K7460 Unspecified cirrhosis of liver: Secondary | ICD-10-CM | POA: Insufficient documentation

## 2013-02-07 DIAGNOSIS — B182 Chronic viral hepatitis C: Secondary | ICD-10-CM | POA: Insufficient documentation

## 2013-02-07 LAB — IRON AND TIBC
Saturation Ratios: 19 % — ABNORMAL LOW (ref 20–55)
TIBC: 332 ug/dL (ref 215–435)

## 2013-02-07 LAB — CBC WITH DIFFERENTIAL/PLATELET
Basophils Absolute: 0 10*3/uL (ref 0.0–0.1)
Eosinophils Absolute: 0.1 10*3/uL (ref 0.0–0.7)
Eosinophils Relative: 4 % (ref 0–5)
HCT: 32.7 % — ABNORMAL LOW (ref 39.0–52.0)
Lymphocytes Relative: 29 % (ref 12–46)
Lymphs Abs: 0.9 10*3/uL (ref 0.7–4.0)
MCH: 32.4 pg (ref 26.0–34.0)
MCV: 94.5 fL (ref 78.0–100.0)
Monocytes Absolute: 0.3 10*3/uL (ref 0.1–1.0)
Platelets: 67 10*3/uL — ABNORMAL LOW (ref 150–400)
RDW: 14 % (ref 11.5–15.5)
Smear Review: DECREASED
WBC: 3 10*3/uL — ABNORMAL LOW (ref 4.0–10.5)

## 2013-02-07 LAB — FERRITIN: Ferritin: 50 ng/mL (ref 22–322)

## 2013-02-07 NOTE — Progress Notes (Signed)
Labs drawn today for cbc/diff,afp,Iron and IBC,ferr

## 2013-02-08 ENCOUNTER — Ambulatory Visit (HOSPITAL_COMMUNITY): Payer: Self-pay | Admitting: Oncology

## 2013-02-18 ENCOUNTER — Encounter (HOSPITAL_COMMUNITY): Payer: Self-pay | Attending: Oncology | Admitting: Oncology

## 2013-02-18 ENCOUNTER — Encounter (HOSPITAL_COMMUNITY): Payer: Self-pay | Admitting: Oncology

## 2013-02-18 VITALS — BP 138/61 | HR 59 | Temp 97.0°F | Resp 18 | Wt 290.4 lb

## 2013-02-18 DIAGNOSIS — D509 Iron deficiency anemia, unspecified: Secondary | ICD-10-CM | POA: Insufficient documentation

## 2013-02-18 DIAGNOSIS — R161 Splenomegaly, not elsewhere classified: Secondary | ICD-10-CM

## 2013-02-18 DIAGNOSIS — M62830 Muscle spasm of back: Secondary | ICD-10-CM

## 2013-02-18 DIAGNOSIS — B182 Chronic viral hepatitis C: Secondary | ICD-10-CM | POA: Insufficient documentation

## 2013-02-18 DIAGNOSIS — K7460 Unspecified cirrhosis of liver: Secondary | ICD-10-CM | POA: Insufficient documentation

## 2013-02-18 DIAGNOSIS — M5380 Other specified dorsopathies, site unspecified: Secondary | ICD-10-CM | POA: Insufficient documentation

## 2013-02-18 DIAGNOSIS — D61818 Other pancytopenia: Secondary | ICD-10-CM | POA: Insufficient documentation

## 2013-02-18 HISTORY — DX: Iron deficiency anemia, unspecified: D50.9

## 2013-02-18 HISTORY — DX: Other pancytopenia: D61.818

## 2013-02-18 MED ORDER — CYCLOBENZAPRINE HCL 10 MG PO TABS
10.0000 mg | ORAL_TABLET | Freq: Two times a day (BID) | ORAL | Status: DC | PRN
Start: 2013-02-18 — End: 2013-03-20

## 2013-02-18 NOTE — Progress Notes (Signed)
[name], PA-C [facility] [address] [place] [address]  Other pancytopenia - Plan: CBC with Differential, Comprehensive metabolic panel, Iron and TIBC, Ferritin  CIRRHOSIS - Plan: Ambulatory referral to Gastroenterology, CBC with Differential, Comprehensive metabolic panel, Iron and TIBC, Ferritin, AFP tumor marker  HEPATITIS C, CHRONIC - Plan: CBC with Differential, Comprehensive metabolic panel, Iron and TIBC, Ferritin, AFP tumor marker  Iron (Fe) deficiency anemia - Plan: ferumoxytol (FERAHEME) injection 510 mg, CBC with Differential, Iron and TIBC, Ferritin  Splenomegaly  CURRENT THERAPY:Observation   INTERVAL HISTORY: Nissim J Gutmann 52 y.o. 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   I personally reviewed and went over laboratory results with the patient. Lab work from 02/07/2013 shows a white blood cell count 3.0, platelet count 67, hemoglobin 0.2, serum iron 62, TIBC 332, and ferritin is down to 50 compared to 224 in December. His percent saturation is down 19% as well. As a result, we'll set him up for 510 mg of IV Feraheme within the next 2 weeks. Alpha-fetoprotein is 25.9 compared to 34.5 in February 2014.  I personally reviewed and went over radiographic studies with the patient. An abdominal ultrasound was performed on 10/03/2011 which demonstrated cirrhosis and hepatic megaly without evidence of hepatocellular carcinoma. Splenomegaly was also noted at that time. Also clearly this year, and March CT abdomen and pelvis was performed do to increasing alpha-fetoprotein but there was no evidence of hepatocellular carcinoma. Changes of cirrhosis with splenomegaly and evidence of portal venous hypertension was appreciated without any focal evidence of liver lesion. Large collateral veins and left abdomen and pelvis were noted as well.  The patient's hepatologist is Dr. Jacqualine Mau in  [place] in our last encounter the patient report he hasn't seen him in some time. On the appointment was appears as though he is in no show in June of 2013. The patient our last encounter was provided to telephone number to his hepatologist office in requested that he contact the office for a followup appointment. The patient reports she is left multiple messages at this office without a return telephone call. As a result, we will refer him back to Dr. fields for followup of his cirrhosis.  The patient reports that he is more fatigued and he has been in the past. His hemoglobin is noted be 1 g less than it was on previous laboratory work. His platelet and white blood cell count are stable.  He also reports a right low back muscle spasms. His right low back lateral to the spine muscles are appreciated to be tender to palpation and tight. He has not taken any medication for this pain. He has been advised not to take Aleve and ibuprofen. I'll give him a prescription for Flexeril for muscle spasms and this was E. scribed to his pharmacy at [facility].  Hematologically, the patient denies any complaints of ROS questioning is negative. We reviewed his scans and laboratory work. We'll some of her Feraheme in the near future.   He also reports lower extremity edema which is not present today. It sounds as though this is dependent edema and worsens as the day progresses. I provided education regarding dependent edema and advised him to keep his lower extremities elevated when at home if possible.   Past Medical History  Diagnosis Date  . Hypertension   . Diabetes mellitus   . GERD (gastroesophageal reflux disease)  DEC 2010 EGD/Bx REACTIVE GASTROPATHY, NO VARICES  . Hemorrhoids, internal   . BMI 40.0-44.9, adult OCT 2010 269 LBS    APR 2012 279 LBS  . 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 IV drug abuse REMOTE  . Hepatitis 2010 HEP C    AST 509 ALT 267 ALK PHOS 165 ALB 3.8 NEG IGM  HAV/HBSAg  . Gallstone AUG 2012 1 CM  . GERD 10/25/2009  . COPD (chronic obstructive pulmonary disease)   . Hepatitis C   . Pancytopenia 2013  . Splenomegaly 2013  . Other pancytopenia 02/18/2013  . Iron (Fe) deficiency anemia 02/18/2013  . Splenomegaly 02/18/2013    has HEPATITIS C, CHRONIC; DM; ALCOHOL ABUSE; GERD; CIRRHOSIS; UNSPECIFIED DISEASE OF PANCREAS; FATIGUE; LIVER FUNCTION TESTS, ABNORMAL, HX OF; ABDOMINAL PAIN, CHRONIC; Gallstone; Colon cancer screening; Other pancytopenia; Iron (Fe) deficiency anemia; and Splenomegaly on his problem list.     has No Known Allergies.  Mr. Pienta had no medications administered during this visit.  Past Surgical History  Procedure Laterality Date  . Sigmoidoscopy      2001 DR. FLEISCHMAN INTERNAL HERMORRHOIDS  . Upper gastrointestinal endoscopy  DEC 2010    BENIGN POLYPS, GASTRITIS, ?phg  . Knee surgery  RIGHT  . Hemorrhoid surgery    . Esophageal biopsy  09/08/2011    Procedure: BIOPSY;  Surgeon: Arlyce Harman, MD;  Location: [facility];  Service: Endoscopy;;  Gastric Biopsies and Gastric Polyp Biopsy    Denies any headaches, dizziness, double vision, fevers, chills, night sweats, nausea, vomiting, diarrhea, constipation, chest pain, heart palpitations, shortness of breath, blood in stool, black tarry stool, urinary pain, urinary burning, urinary frequency, hematuria.   PHYSICAL EXAMINATION  ECOG PERFORMANCE STATUS: 1 - Symptomatic but completely ambulatory  Filed Vitals:   02/18/13 1007  BP: 138/61  Pulse: 59  Temp: 97 F (36.1 C)  Resp: 18    GENERAL:alert, comfortable, cooperative, obese, smiling and chronically-ill appearing  SKIN: skin color, texture, turgor are normal, no rashes or significant lesions  HEAD: Normocephalic, No masses, lesions, tenderness or abnormalities  EYES: normal, Conjunctiva are pink and non-injected  EARS: External ears normal  OROPHARYNX:mucous membranes are moist and poor dentition  NECK: supple, no  adenopathy, thyroid normal size, non-tender, without nodularity, no stridor, non-tender, trachea midline  LYMPH: no palpable lymphadenopathy  BREAST:not examined  LUNGS: clear to auscultation and percussion  HEART: regular rate & rhythm, no murmurs, no gallops, S1 normal and S2 normal  ABDOMEN:abdomen soft, non-tender, central obesity, normal bowel sounds and no masses.  Hepatomegaly and splenomegaly noted.  BACK: Back symmetric, no curvature., No CVA tenderness  EXTREMITIES:less then 2 second capillary refill, no joint deformities, effusion, or inflammation, no skin discoloration, no cyanosis  NEURO: alert & oriented x 3 with fluent speech, no focal motor/sensory deficits, gait normal     LABORATORY DATA: CBC    Component Value Date/Time   WBC 3.0* 02/07/2013 0944   RBC 3.46* 02/07/2013 0944   HGB 11.2* 02/07/2013 0944   HCT 32.7* 02/07/2013 0944   PLT 67* 02/07/2013 0944   MCV 94.5 02/07/2013 0944   MCH 32.4 02/07/2013 0944   MCHC 34.3 02/07/2013 0944   RDW 14.0 02/07/2013 0944   LYMPHSABS 0.9 02/07/2013 0944   MONOABS 0.3 02/07/2013 0944   EOSABS 0.1 02/07/2013 0944   BASOSABS 0.0 02/07/2013 0944      Chemistry      Component Value Date/Time   NA 135 10/07/2012 1155  K 3.9 10/07/2012 1155   CL 102 10/07/2012 1155   CO2 24 10/07/2012 1155   BUN 15 10/07/2012 1155   CREATININE 0.69 10/07/2012 1155   CREATININE 0.67 07/19/2012 0823      Component Value Date/Time   CALCIUM 9.0 10/07/2012 1155   ALKPHOS 144* 10/07/2012 1155   AST 292* 10/07/2012 1155   ALT 233* 10/07/2012 1155   BILITOT 1.4* 10/07/2012 1155     Lab Results  Component Value Date   IRON 62 02/07/2013   TIBC 332 02/07/2013   FERRITIN 50 02/07/2013    Results for LARS, JEZIORSKI (MRN [id number]) as of 02/18/2013 10:23  Ref. Range 02/07/2013 09:44  AFP-Tumor Marker Latest Range: 0.0-8.0 ng/mL 25.9 (H)      RADIOGRAPHIC STUDIES:  10/13/2012  *RADIOLOGY REPORT*  Clinical Data: Upper abdominal pain, fatigue. Known  cirrhosis.  Increasing AFP. Evaluate for hepatic cellular carcinoma.  CT ABDOMEN AND PELVIS WITHOUT AND WITH CONTRAST  Technique: Multidetector CT imaging of the abdomen and pelvis was  performed without contrast material in one or both body regions,  followed by contrast material(s) and further sections in one or  both body regions.  Contrast: OMNIPAQUE IOHEXOL 300 MG/ML SOLN  Comparison: Ultrasound 10/03/2011. CT 03/12/2010.  Findings: Lung bases are clear. Small cystic areas and scarring in  the anterior right middle lobe. Heart is normal size. No  effusions.  Changes of cirrhosis with nodular contours. Enlarged spleen with a  craniocaudal length of 18.7 cm. No enhancing areas in the liver.  No focal mass. There are mildly prominent upper abdominal lymph  nodes, likely reactive to the patient's liver disease. Stomach,  pancreas, adrenals and kidneys are normal.  Small layering gallstones within the gallbladder. Portal vein  appears patent. Appendix is visualized and is normal. Large and  small bowel are unremarkable. Aorta is normal caliber.  Large collateral venous channels are noted around the spleen and in  the left abdomen. A large collateral vein continues into the left  pelvis and left scrotum. This presumably communicates with and  drains through the left gonadal vein.  Trace free fluid in the pelvis.  No acute bony abnormality.  IMPRESSION:  Changes of cirrhosis with splenomegaly and evidence of portal  venous hypertension. No focal liver lesion.  Large collateral veins in the left abdomen and pelvis communicate  with the gonadal vein in the left scrotum.  Trace free fluid in the pelvis.  Cholelithiasis.  Original Report Authenticated By: Charlett Nose, M.D.  10/02/2012  *RADIOLOGY REPORT*  Clinical Data: Chronic hepatitis C. Cirrhosis.  COMPLETE ABDOMINAL ULTRASOUND  Comparison: 03/20/2011 and CT of 03/12/2010.  Findings:  Gallbladder: Gallstone measuring 1.0  cm. No wall thickening or  pericholecystic fluid. Sonographic Murphy's sign was not elicited.  Common bile duct: Normal, 5 mm.  Liver: Hepatomegaly, 19.5 cm. Moderate cirrhosis. No focal liver  lesion.  IVC: Negative  Pancreas: Poorly visualized due to overlying bowel gas.  Spleen: Splenomegaly, moderate to severe. 21.3 cm cranial caudal.  Right Kidney: 12.8 cm. No hydronephrosis.  Left Kidney: 14.3 cm. No hydronephrosis.  Abdominal aorta: Nonaneurysmal without ascites.  IMPRESSION:  1. Cirrhosis and hepatomegaly without evidence of hepatocellular  carcinoma.  2. Splenomegaly, likely representing portal venous hypertension.  3. Cholelithiasis without cholecystitis.  Original Report Authenticated By: Consuello Bossier, M.D.     ASSESSMENT:  1. Pancytopenia, multifactorial.  2. Hepatitis C viral infection  3. Diabetes mellitus on therapy  4. Obesity  5. Poor dental hygiene  6. Skin rash in each axilla felt to be most likely from dermophyte infection  7. History of IV cocaine use in the distant past  8. History of alcoholism having quit drinking 5 years ago  Patient Active Problem List   Diagnosis Date Noted  . Other pancytopenia 02/18/2013  . Iron (Fe) deficiency anemia 02/18/2013  . Splenomegaly 02/18/2013  . Colon cancer screening 08/28/2011  . Gallstone 05/01/2011  . ABDOMINAL PAIN, CHRONIC 09/05/2010  . DM 02/15/2010  . GERD 10/25/2009  . CIRRHOSIS 10/25/2009  . FATIGUE 10/25/2009  . HEPATITIS C, CHRONIC 06/05/2009  . ALCOHOL ABUSE 06/05/2009  . UNSPECIFIED DISEASE OF PANCREAS 06/05/2009  . LIVER FUNCTION TESTS, ABNORMAL, HX OF 06/05/2009     PLAN:  1. I personally reviewed and went over laboratory results with the patient.  2. I personally reviewed and went over radiographic studies with the patient.  3. Labs in 4 months: CBC diff, Iron/TIBC, Ferritin, AFP  4. Refer the patient to Dr. Darrick Penna.  Patient has been unable to get in touch with Dr. Jacqualine Mau in [place]  and I suspect he needs a local GI physician due to financial constrtaints  5. Rx for Flexeril for right back muscle spasms.  [facility].  6. Feraheme 510 mg one time within the next two weeks.  Order is signed and held.  7. Return in 4 months for follow-up   THERAPY PLAN:  The patient's biggest complaint is his cirrhosis, hepatitis C, and splenomegaly and this is all concerning to his laboratory work and also is chronic issues. We will continue to support his laboratory work and get him Feraheme 500 mg within the next 2 weeks for a lowering ferritin. We'll see him back in 4 months for followup with laboratory work. We'll refer him to Dr. Darrick Penna for GI followup.   All questions were answered. The patient knows to call the clinic with any problems, questions or concerns. We can certainly see the patient much sooner if necessary.  Patient and plan discussed with Dr. Gerarda Fraction and he is in agreement with the aforementioned.  KEFALAS,THOMAS

## 2013-02-18 NOTE — Patient Instructions (Signed)
[facility] Discharge Instructions  RECOMMENDATIONS MADE BY THE CONSULTANT AND ANY TEST RESULTS WILL BE SENT TO YOUR REFERRING PHYSICIAN.  EXAM FINDINGS BY THE PHYSICIAN TODAY AND SIGNS OR SYMPTOMS TO REPORT TO CLINIC OR PRIMARY PHYSICIAN: Exam findings as discussed by T. Jacalyn Lefevre, PA-C.  MEDICATIONS PRESCRIBED:  1.  Flexeril as prescribed - if you need more refills, please follow up with the health department.  SPECIAL INSTRUCTIONS/FOLLOW-UP: 1.  You were referred back to Dr. Darrick Penna for your cirrhosis. 2.  Return in 4 months as scheduled for labs and office visit. 3.  You are scheduled to receive iron next week, please keep that appointment as scheduled.  Thank you for choosing [facility] to provide your oncology and hematology care.  To afford each patient quality time with our providers, please arrive at least 15 minutes before your scheduled appointment time.  With your help, our goal is to use those 15 minutes to complete the necessary work-up to ensure our physicians have the information they need to help with your evaluation and healthcare recommendations.    Effective January 1st, 2014, we ask that you re-schedule your appointment with our physicians should you arrive 10 or more minutes late for your appointment.  We strive to give you quality time with our providers, and arriving late affects you and other patients whose appointments are after yours.    Again, thank you for choosing [facility].  Our hope is that these requests will decrease the amount of time that you wait before being seen by our physicians.       _____________________________________________________________  Should you have questions after your visit to [facility], please contact our office at [phone] between the hours of 8:30 a.m. and 5:00 p.m.  Voicemails left after 4:30 p.m. will not be returned until the following business day.  For  prescription refill requests, have your pharmacy contact our office with your prescription refill request.

## 2013-02-18 NOTE — Progress Notes (Signed)
[facility] Discharge Instructions  RECOMMENDATIONS MADE BY THE CONSULTANT AND ANY TEST RESULTS WILL BE SENT TO YOUR REFERRING PHYSICIAN.  EXAM FINDINGS BY THE PHYSICIAN TODAY AND SIGNS OR SYMPTOMS TO REPORT TO CLINIC OR PRIMARY PHYSICIAN: Exam findings as discuss  MEDICATIONS PRESCRIBED:  1.  Flexeril as prescribed - this is a one-time prescription.  If you need refills, please follow up with the health department.  SPECIAL INSTRUCTIONS/FOLLOW-UP: 1.  Please follow-up with Dr. Darrick Penna regarding your cirrhosis. 2.  Please return as scheduled for an iron infusion. 3.  Return in 4 months as scheduled for labs.  Thank you for choosing [facility] to provide your oncology and hematology care.  To afford each patient quality time with our providers, please arrive at least 15 minutes before your scheduled appointment time.  With your help, our goal is to use those 15 minutes to complete the necessary work-up to ensure our physicians have the information they need to help with your evaluation and healthcare recommendations.    Effective January 1st, 2014, we ask that you re-schedule your appointment with our physicians should you arrive 10 or more minutes late for your appointment.  We strive to give you quality time with our providers, and arriving late affects you and other patients whose appointments are after yours.    Again, thank you for choosing [facility].  Our hope is that these requests will decrease the amount of time that you wait before being seen by our physicians.       _____________________________________________________________  Should you have questions after your visit to [facility], please contact our office at [phone] between the hours of 8:30 a.m. and 5:00 p.m.  Voicemails left after 4:30 p.m. will not be returned until the following business day.  For prescription refill requests, have your pharmacy contact  our office with your prescription refill request.

## 2013-02-25 ENCOUNTER — Encounter (HOSPITAL_BASED_OUTPATIENT_CLINIC_OR_DEPARTMENT_OTHER): Payer: Self-pay

## 2013-02-25 VITALS — BP 129/64 | HR 53 | Temp 98.1°F | Resp 18

## 2013-02-25 DIAGNOSIS — D509 Iron deficiency anemia, unspecified: Secondary | ICD-10-CM

## 2013-02-25 MED ORDER — FERUMOXYTOL INJECTION 510 MG/17 ML
510.0000 mg | Freq: Once | INTRAVENOUS | Status: AC
  Administered 2013-02-25: 510 mg via INTRAVENOUS
  Filled 2013-02-25: qty 17

## 2013-02-25 NOTE — Progress Notes (Signed)
Tolerated fereheme 510 mg infuision IV well.

## 2013-03-20 ENCOUNTER — Inpatient Hospital Stay (HOSPITAL_COMMUNITY)
Admission: EM | Admit: 2013-03-20 | Discharge: 2013-03-23 | DRG: 392 | Disposition: A | Discharge status: Home / Self Care | Payer: Medicaid Other | Attending: Internal Medicine | Admitting: Internal Medicine

## 2013-03-20 ENCOUNTER — Emergency Department (HOSPITAL_COMMUNITY): Payer: Medicaid Other

## 2013-03-20 ENCOUNTER — Inpatient Hospital Stay (HOSPITAL_COMMUNITY): Payer: Medicaid Other

## 2013-03-20 ENCOUNTER — Encounter (HOSPITAL_COMMUNITY): Payer: Self-pay | Admitting: Emergency Medicine

## 2013-03-20 DIAGNOSIS — D61818 Other pancytopenia: Secondary | ICD-10-CM | POA: Diagnosis present

## 2013-03-20 DIAGNOSIS — J4489 Other specified chronic obstructive pulmonary disease: Secondary | ICD-10-CM | POA: Diagnosis present

## 2013-03-20 DIAGNOSIS — I129 Hypertensive chronic kidney disease with stage 1 through stage 4 chronic kidney disease, or unspecified chronic kidney disease: Secondary | ICD-10-CM | POA: Diagnosis present

## 2013-03-20 DIAGNOSIS — K8050 Calculus of bile duct without cholangitis or cholecystitis without obstruction: Secondary | ICD-10-CM

## 2013-03-20 DIAGNOSIS — Z6841 Body Mass Index (BMI) 40.0 and over, adult: Secondary | ICD-10-CM

## 2013-03-20 DIAGNOSIS — K8021 Calculus of gallbladder without cholecystitis with obstruction: Secondary | ICD-10-CM

## 2013-03-20 DIAGNOSIS — E8809 Other disorders of plasma-protein metabolism, not elsewhere classified: Secondary | ICD-10-CM | POA: Diagnosis present

## 2013-03-20 DIAGNOSIS — K219 Gastro-esophageal reflux disease without esophagitis: Secondary | ICD-10-CM | POA: Diagnosis present

## 2013-03-20 DIAGNOSIS — Z794 Long term (current) use of insulin: Secondary | ICD-10-CM

## 2013-03-20 DIAGNOSIS — E669 Obesity, unspecified: Secondary | ICD-10-CM | POA: Diagnosis present

## 2013-03-20 DIAGNOSIS — F1210 Cannabis abuse, uncomplicated: Secondary | ICD-10-CM | POA: Diagnosis present

## 2013-03-20 DIAGNOSIS — J449 Chronic obstructive pulmonary disease, unspecified: Secondary | ICD-10-CM | POA: Diagnosis present

## 2013-03-20 DIAGNOSIS — B182 Chronic viral hepatitis C: Secondary | ICD-10-CM | POA: Diagnosis present

## 2013-03-20 DIAGNOSIS — Z862 Personal history of diseases of the blood and blood-forming organs and certain disorders involving the immune mechanism: Secondary | ICD-10-CM | POA: Diagnosis present

## 2013-03-20 DIAGNOSIS — E119 Type 2 diabetes mellitus without complications: Secondary | ICD-10-CM | POA: Diagnosis present

## 2013-03-20 DIAGNOSIS — Z87442 Personal history of urinary calculi: Secondary | ICD-10-CM

## 2013-03-20 DIAGNOSIS — Z79899 Other long term (current) drug therapy: Secondary | ICD-10-CM

## 2013-03-20 DIAGNOSIS — R1011 Right upper quadrant pain: Principal | ICD-10-CM

## 2013-03-20 DIAGNOSIS — R109 Unspecified abdominal pain: Secondary | ICD-10-CM

## 2013-03-20 DIAGNOSIS — K7460 Unspecified cirrhosis of liver: Secondary | ICD-10-CM | POA: Diagnosis present

## 2013-03-20 DIAGNOSIS — K8020 Calculus of gallbladder without cholecystitis without obstruction: Secondary | ICD-10-CM | POA: Diagnosis present

## 2013-03-20 DIAGNOSIS — Z23 Encounter for immunization: Secondary | ICD-10-CM

## 2013-03-20 DIAGNOSIS — N189 Chronic kidney disease, unspecified: Secondary | ICD-10-CM | POA: Diagnosis present

## 2013-03-20 LAB — GLUCOSE, CAPILLARY: Glucose-Capillary: 177 mg/dL — ABNORMAL HIGH (ref 70–99)

## 2013-03-20 LAB — APTT: aPTT: 31 seconds (ref 24–37)

## 2013-03-20 LAB — COMPREHENSIVE METABOLIC PANEL
AST: 301 U/L — ABNORMAL HIGH (ref 0–37)
Albumin: 3.2 g/dL — ABNORMAL LOW (ref 3.5–5.2)
BUN: 15 mg/dL (ref 6–23)
Calcium: 8.9 mg/dL (ref 8.4–10.5)
Creatinine, Ser: 1.02 mg/dL (ref 0.50–1.35)
Total Protein: 7.5 g/dL (ref 6.0–8.3)

## 2013-03-20 LAB — AMMONIA: Ammonia: 107 umol/L — ABNORMAL HIGH (ref 11–60)

## 2013-03-20 LAB — CBC WITH DIFFERENTIAL/PLATELET
Basophils Relative: 0 % (ref 0–1)
Eosinophils Absolute: 0 10*3/uL (ref 0.0–0.7)
Hemoglobin: 13.5 g/dL (ref 13.0–17.0)
Lymphocytes Relative: 4 % — ABNORMAL LOW (ref 12–46)
MCHC: 33.3 g/dL (ref 30.0–36.0)
Monocytes Relative: 10 % (ref 3–12)
Neutrophils Relative %: 86 % — ABNORMAL HIGH (ref 43–77)
RBC: 4.19 MIL/uL — ABNORMAL LOW (ref 4.22–5.81)
Smear Review: DECREASED
WBC: 8 10*3/uL (ref 4.0–10.5)

## 2013-03-20 LAB — PROTIME-INR
INR: 1.16 (ref 0.00–1.49)
Prothrombin Time: 14.6 seconds (ref 11.6–15.2)

## 2013-03-20 LAB — LIPASE, BLOOD: Lipase: 76 U/L — ABNORMAL HIGH (ref 11–59)

## 2013-03-20 MED ORDER — ALBUTEROL SULFATE HFA 108 (90 BASE) MCG/ACT IN AERS
2.0000 | INHALATION_SPRAY | Freq: Four times a day (QID) | RESPIRATORY_TRACT | Status: DC | PRN
Start: 2013-03-20 — End: 2013-03-23
  Administered 2013-03-20: 2 via RESPIRATORY_TRACT
  Filled 2013-03-20: qty 6.7

## 2013-03-20 MED ORDER — ONDANSETRON HCL 4 MG/2ML IJ SOLN
4.0000 mg | Freq: Once | INTRAMUSCULAR | Status: AC
  Administered 2013-03-20: 4 mg via INTRAVENOUS
  Filled 2013-03-20: qty 2

## 2013-03-20 MED ORDER — PANTOPRAZOLE SODIUM 40 MG IV SOLR
40.0000 mg | Freq: Two times a day (BID) | INTRAVENOUS | Status: DC
  Administered 2013-03-20 – 2013-03-22 (×4): 40 mg via INTRAVENOUS
  Filled 2013-03-20 (×4): qty 40

## 2013-03-20 MED ORDER — INSULIN GLARGINE 100 UNIT/ML ~~LOC~~ SOLN
5.0000 [IU] | Freq: Every day | SUBCUTANEOUS | Status: DC
  Administered 2013-03-21 – 2013-03-22 (×3): 5 [IU] via SUBCUTANEOUS
  Filled 2013-03-20 (×4): qty 0.05

## 2013-03-20 MED ORDER — ONDANSETRON HCL 4 MG/2ML IJ SOLN: 4.0000 mg | Freq: Three times a day (TID) | INTRAMUSCULAR | Status: DC | PRN

## 2013-03-20 MED ORDER — ONDANSETRON HCL 4 MG PO TABS: 4.0000 mg | ORAL_TABLET | Freq: Four times a day (QID) | ORAL | Status: DC | PRN

## 2013-03-20 MED ORDER — METOPROLOL TARTRATE 50 MG PO TABS
50.0000 mg | ORAL_TABLET | Freq: Two times a day (BID) | ORAL | Status: DC
Start: 2013-03-20 — End: 2013-03-23
  Administered 2013-03-21 – 2013-03-23 (×5): 50 mg via ORAL
  Filled 2013-03-20 (×6): qty 1

## 2013-03-20 MED ORDER — SODIUM CHLORIDE 0.9 % IV BOLUS (SEPSIS)
1000.0000 mL | Freq: Once | INTRAVENOUS | Status: AC
  Administered 2013-03-20: 1000 mL via INTRAVENOUS

## 2013-03-20 MED ORDER — PNEUMOCOCCAL VAC POLYVALENT 25 MCG/0.5ML IJ INJ
0.5000 mL | INJECTION | INTRAMUSCULAR | Status: AC
  Administered 2013-03-21: 0.5 mL via INTRAMUSCULAR
  Filled 2013-03-20: qty 0.5

## 2013-03-20 MED ORDER — IOHEXOL 300 MG/ML  SOLN
50.0000 mL | Freq: Once | INTRAMUSCULAR | Status: AC | PRN
  Administered 2013-03-20: 50 mL via ORAL

## 2013-03-20 MED ORDER — SODIUM CHLORIDE 0.9 % IV SOLN
INTRAVENOUS | Status: DC
  Administered 2013-03-20 – 2013-03-21 (×2): via INTRAVENOUS

## 2013-03-20 MED ORDER — INSULIN GLARGINE 100 UNIT/ML ~~LOC~~ SOLN
SUBCUTANEOUS | Status: AC
  Filled 2013-03-20: qty 10

## 2013-03-20 MED ORDER — OXYCODONE HCL 5 MG PO TABS
5.0000 mg | ORAL_TABLET | ORAL | Status: DC | PRN
  Administered 2013-03-20 – 2013-03-22 (×4): 5 mg via ORAL
  Filled 2013-03-20 (×4): qty 1

## 2013-03-20 MED ORDER — HYDROMORPHONE HCL PF 1 MG/ML IJ SOLN: 1.0000 mg | INTRAMUSCULAR | Status: DC | PRN

## 2013-03-20 MED ORDER — HYDROMORPHONE HCL PF 1 MG/ML IJ SOLN
1.0000 mg | Freq: Once | INTRAMUSCULAR | Status: AC
  Administered 2013-03-20: 1 mg via INTRAVENOUS
  Filled 2013-03-20: qty 1

## 2013-03-20 MED ORDER — SODIUM CHLORIDE 0.9 % IV SOLN
INTRAVENOUS | Status: DC
  Administered 2013-03-20: 16:00:00 via INTRAVENOUS

## 2013-03-20 MED ORDER — SODIUM CHLORIDE 0.9 % IJ SOLN
3.0000 mL | Freq: Two times a day (BID) | INTRAMUSCULAR | Status: DC
  Administered 2013-03-21 – 2013-03-23 (×3): 3 mL via INTRAVENOUS

## 2013-03-20 MED ORDER — HYDROMORPHONE HCL PF 1 MG/ML IJ SOLN
1.0000 mg | INTRAMUSCULAR | Status: DC | PRN
  Administered 2013-03-20 – 2013-03-22 (×9): 1 mg via INTRAVENOUS
  Filled 2013-03-20 (×9): qty 1

## 2013-03-20 MED ORDER — IOHEXOL 300 MG/ML  SOLN
100.0000 mL | Freq: Once | INTRAMUSCULAR | Status: AC | PRN
  Administered 2013-03-20: 100 mL via INTRAVENOUS

## 2013-03-20 MED ORDER — ONDANSETRON HCL 4 MG/2ML IJ SOLN
4.0000 mg | Freq: Four times a day (QID) | INTRAMUSCULAR | Status: DC | PRN
  Administered 2013-03-22: 4 mg via INTRAVENOUS
  Filled 2013-03-20: qty 2

## 2013-03-20 MED ORDER — SODIUM CHLORIDE 0.9 % IV SOLN: INTRAVENOUS | Status: DC

## 2013-03-20 MED ORDER — INSULIN ASPART 100 UNIT/ML ~~LOC~~ SOLN
0.0000 [IU] | Freq: Four times a day (QID) | SUBCUTANEOUS | Status: DC
  Administered 2013-03-20: 2 [IU] via SUBCUTANEOUS
  Administered 2013-03-21 – 2013-03-23 (×4): 1 [IU] via SUBCUTANEOUS

## 2013-03-20 NOTE — ED Notes (Signed)
Pt c/o right side abdominal pain x4 days as well as N/V/D that began this morning. Pt denies blood in vomit or stool. Pt also reports intermittent hematuria and well as malodorous urine.

## 2013-03-20 NOTE — H&P (Signed)
History and Physical  [name] JXB:[id number] DOB: Jun 28, 1961 DOA: 03/20/2013  Referring physician: Dr. Deretha Emory PCP: [name], PA-C   Chief Complaint: abdominal pain  HPI:  52 year old man with history of hepatitis C, cirrhosis presents to the emergency department with acute on chronic abdominal pain, predominantly right upper quadrant but some right lower quadrant. Initial evaluation in the emergency department was notable for stable vital signs, tender right upper and right lower quadrant. White blood cell count was normal, CMP revealed chronic elevation of AST, ALT, alkaline phosphatase. Bilirubin 2.8. Chest x-ray unremarkable. CT of the abdomen and pelvis revealed bilateral delayed nephrogram of unclear etiology. Because of ongoing abdominal pain and history, the patient was referred for admission.  Patient reports abdominal pain has been going on for 2-3 weeks, predominantly right mid abdomen as well as right flank radiating around to the right lower cautery. Aggravated by movement, no specific alleviating factors. He has a sharp component, intensity 8/10 until today when it was 10/10. Today he developed severe pain much worse than what he had been experiencing for the last few weeks. He localizes this to the right midabdomen, somewhat in the upper quadrant as well as the right flank radiating around. He noted some chills at home and a feeling of fever, several episodes of vomiting (no blood) an episode of diarrhea. He was treated with Dilaudid, Zofran. He does have a history of gallstones and had previously been referred for consideration of elective cholecystectomy which was deferred secondary to history of cirrhosis and lack of acute issue. He has been urinating without difficulty.  Review of Systems:  Negative for fever, new visual changes, sore throat, new rash, new muscle aches, chest pain, SOB, dysuria, bleeding.  Past Medical History  Diagnosis Date  . Hypertension   .  Diabetes mellitus   . GERD (gastroesophageal reflux disease)     DEC 2010 EGD/Bx REACTIVE GASTROPATHY, NO VARICES  . Hemorrhoids, internal   . BMI 40.0-44.9, adult OCT 2010 269 LBS    APR 2012 279 LBS  . 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 IV drug abuse REMOTE  . Hepatitis 2010 HEP C    AST 509 ALT 267 ALK PHOS 165 ALB 3.8 NEG IGM HAV/HBSAg  . Gallstone AUG 2012 1 CM  . GERD 10/25/2009  . COPD (chronic obstructive pulmonary disease)   . Hepatitis C   . Pancytopenia 2013  . Splenomegaly 2013  . Other pancytopenia 02/18/2013  . Iron (Fe) deficiency anemia 02/18/2013  . Splenomegaly 02/18/2013    Past Surgical History  Procedure Laterality Date  . Sigmoidoscopy      2001 DR. FLEISCHMAN INTERNAL HERMORRHOIDS  . Upper gastrointestinal endoscopy  DEC 2010    BENIGN POLYPS, GASTRITIS, ?phg  . Knee surgery  RIGHT  . Hemorrhoid surgery    . Esophageal biopsy  09/08/2011    Procedure: BIOPSY;  Surgeon: Arlyce Harman, MD;  Location: [facility];  Service: Endoscopy;;  Gastric Biopsies and Gastric Polyp Biopsy    Social History:  reports that he has never smoked. He has never used smokeless tobacco. He reports that he uses illicit drugs (Marijuana and Cocaine). He reports that he does not drink alcohol.  No Known Allergies  Family History  Problem Relation Age of Onset  . Colon cancer Neg Hx   . Anesthesia problems Neg Hx   . Hypotension Neg Hx   . Malignant hyperthermia Neg Hx   . Pseudochol deficiency Neg  Hx   . Cancer Father      Prior to Admission medications   Medication Sig Start Date End Date Taking? Authorizing Provider  albuterol (PROVENTIL HFA;VENTOLIN HFA) 108 (90 BASE) MCG/ACT inhaler Inhale 2 puffs into the lungs every 6 (six) hours as needed. Shortness of breath   Yes Historical Provider, MD  esomeprazole (NEXIUM) 40 MG capsule Take 1 capsule (40 mg total) by mouth 2 (two) times daily. One po once to twice daily before a meal. 01/25/13  Yes Tiffany Kocher, PA-C  glipiZIDE (GLUCOTROL) 10 MG tablet Take 10 mg by mouth 2 (two) times daily before a meal.     Yes Historical Provider, MD  insulin glargine (LANTUS) 100 UNIT/ML injection Inject 10 Units into the skin at bedtime.   Yes Historical Provider, MD  metFORMIN (GLUCOPHAGE) 1000 MG tablet Take 1,000 mg by mouth 2 (two) times daily with a meal.   Yes Historical Provider, MD  metoprolol (LOPRESSOR) 50 MG tablet Take 50 mg by mouth 2 (two) times daily.   Yes Historical Provider, MD  quinapril (ACCUPRIL) 20 MG tablet Take 40 mg by mouth at bedtime.    Yes Historical Provider, MD   Physical Exam: Filed Vitals:   03/20/13 1221  BP: 114/47  Pulse: 97  Temp: 99.6 F (37.6 C)  TempSrc: Oral  Resp: 22  Weight: 131.543 kg (290 lb)  SpO2: 95%   General: Examined in the emergency department. Appears calm, mild to moderately uncomfortable , nontoxic. Eyes: PERRL, normal lids, irises  ENT: grossly normal hearing, lips & tongue Neck: no LAD, masses or thyromegaly Cardiovascular: RRR, no m/r/g. No LE edema. Respiratory: CTA bilaterally, no w/r/r. Normal respiratory effort. Abdomen: Obese, skin appears unremarkable. Right mid/upper quadrant pain with palpation. Some right lower quadrant pain and right CVA pain, mild. No rebound or guarding. Skin: There is small abrasions, pigment discoloration left axilla Musculoskeletal: grossly normal tone BUE/BLE. Feet appear grossly unremarkable. Psychiatric: grossly normal mood and affect, speech fluent and appropriate Neurologic: grossly non-focal.  Wt Readings from Last 3 Encounters:  03/20/13 131.543 kg (290 lb)  02/18/13 131.725 kg (290 lb 6.4 oz)  10/07/12 129.819 kg (286 lb 3.2 oz)    Labs on Admission:  Basic Metabolic Panel:  Recent Labs Lab 03/20/13 1356  NA 132*  K 4.6  CL 101  CO2 22  GLUCOSE 251*  BUN 15  CREATININE 1.02  CALCIUM 8.9    Liver Function Tests:  Recent Labs Lab 03/20/13 1356  AST 301*  ALT 161*  ALKPHOS  131*  BILITOT 2.8*  PROT 7.5  ALBUMIN 3.2*    Recent Labs Lab 03/20/13 1356  LIPASE 76*    Recent Labs Lab 03/20/13 1423  AMMONIA 107*    CBC:  Recent Labs Lab 03/20/13 1356  WBC 8.0  NEUTROABS 6.9  HGB 13.5  HCT 40.5  MCV 96.7  PLT 63*    Radiological Exams on Admission: Dg Chest 2 View  03/20/2013   *RADIOLOGY REPORT*  Clinical Data: Weakness and cough  CHEST - 2 VIEW  Comparison: 04/08/2004  Findings: Lungs are under aerated and clear.  Normal heart size. No pneumothorax.  No pleural effusion.  IMPRESSION: No active cardiopulmonary disease.   Original Report Authenticated By: Jolaine Click, M.D.   Ct Abdomen Pelvis W Contrast  03/20/2013   *RADIOLOGY REPORT*  Clinical Data: The abdominal pain.  Nausea vomiting.  Diabetes. Cirrhosis.  Hepatitis C.  CT ABDOMEN AND PELVIS WITH CONTRAST  Technique:  Multidetector  CT imaging of the abdomen and pelvis was performed following the standard protocol during bolus administration of intravenous contrast.  Contrast: 50mL OMNIPAQUE IOHEXOL 300 MG/ML  SOLN, OMNIPAQUE IOHEXOL 300 MG/ML  SOLN  Comparison: 10/13/2012  Findings: Stable mildly prominent epicardial lymph nodes.  Trace perihepatic ascites.  Lobulated liver contour compatible with cirrhosis.  Prominent but stable splenomegaly noted with considerable varices indicating portal venous hypertension.  No portal vein thrombosis observed.  Small dependent gallstones in the gallbladder.  Adrenal glands and pancreas appear unremarkable.  Portacaval node short axis 1.6 cm, formerly the same.  Scattered small mesenteric and retroperitoneal lymph nodes, similar to prior.  Mild stranding at the root the mesentery, unchanged.  There is trace fluid tracking in the right paracolic gutter. Appendix normal where visualized.  Orally administered contrast is only in the duodenum and proximal jejunum at the time of imaging.  Small hypodense lesions in the right kidney are likely cyst but technically  nonspecific.  There is a 3 x 1 mm left kidney lower pole nonobstructive calculus and a 1 mm left mid kidney nonobstructive calculus.  No hydronephrosis or hydroureter. Urinary bladder unremarkable.  Small amount of pelvic ascites. Left later renal vein collateral to the left gonadal vein observed.  There is no appreciable excretion of contrast medium from the kidneys on the delayed phase images.  IMPRESSION:  1.  Abnormal bilateral delayed nephrogram, with lack of excretion of contrast into the collecting systems 4 minutes out from contrast injection.  Differential diagnostic considerations include acute tubular necrosis and acute glomerulonephritis.  I telephoned the radiology department and the technologist noted that the patient was responding and interacting normally after the scan, making hypotensive shock as a cause for the delayed nephrogram unlikely. 2.  Nonobstructive left nephrolithiasis. 3.  Cirrhosis with stable splenomegaly and collaterals.  Mild ascites potentially from hypoalbuminemia.  4.  borderline prominent lymph nodes, similar to prior and nonspecific. 5.  Cholelithiasis.   Original Report Authenticated By: Gaylyn Rong, M.D.    Principal Problem:   Right upper quadrant abdominal pain Active Problems:   HEPATITIS C, CHRONIC   DM   GERD   CIRRHOSIS   LIVER FUNCTION TESTS, ABNORMAL, HX OF   Gallstone   Other pancytopenia   Biliary colic   Assessment/Plan 1. Right upper quadrant, lower outer abdominal pain: Etiology unclear. Admitted for bowel rest, fluids, antiemetics, repeat exams. Surgery consultation. Possible biliary colic. Afebrile, no leukocytosis. Liver disease makes interpretation of hepatic function panel difficult. No evidence of kidney stone. 2. Possible biliary colic, history of gallstones: Plan as above. There is no clear evidence to suggest cholecystitis. 3. Elevated liver function tests, mixed pattern: Chronic elevation in transaminases and alkaline  phosphatase noted. Bilirubin level higher than previous studies. 4. Abnormal CT of the abdomen and pelvis: Delayed nephrogram. Discussed with the interpreting radiologist Dr. Ova Freshwater, etiology unclear, differential is as reported. Clearly the patient is not in shock and he has normal renal function. Recommends checking KUB to assess for passage of contrast. May be spurious finding. Followup renal function in the morning. 5. Cirrhosis: Although ammonia is elevated mentation is normal, does not appear to be acutely decompensated. 6. Diabetes mellitus: Sliding scale insulin. Lantus. Hold metformin and glipizide while hospitalized. 7. COPD: Appears stable. 8. GERD: Poorly controlled. PPI. 9. Chronic thrombocytopenia: Appears to be at baseline. Secondary to liver disease.    Code Status: Full DVT prophylaxis: SCDs Family Communication: discussed with wife at bedside Disposition Plan/Anticipated LOS: admit, 2-4 days  Time spent: 65 minutes  Brendia Sacks, MD  [facility] Pager [phone] 03/20/2013, 5:39 PM

## 2013-03-20 NOTE — Progress Notes (Signed)
Text paged hospitalist that KUB was resulted.

## 2013-03-20 NOTE — ED Notes (Signed)
States that he started having abdominal pain, nausea, vomiting and diarrhea about 4 days ago, but has slowly gotten worse since then.  States he is having chills.

## 2013-03-20 NOTE — ED Provider Notes (Signed)
CSN: [id number]     Arrival date & time 03/20/13  1207 History  This chart was scribed for Shelda Jakes, MD by Greggory Stallion, ED Scribe. This patient was seen in room APA07/[facility]07 and the patient's care was started at 1:12 PM.   Chief Complaint  Patient presents with  . Abdominal Pain  . Emesis  . Diarrhea   Patient is a 52 y.o. male presenting with abdominal pain, vomiting, and diarrhea. The history is provided by the patient. No language interpreter was used.  Abdominal Pain Pain location:  RUQ and RLQ Pain quality: sharp   Pain radiates to:  Back Pain severity:  Moderate Onset quality:  Gradual Duration:  4 days Timing:  Constant Progression:  Worsening Chronicity:  Recurrent Relieved by:  Nothing Worsened by:  Movement Associated symptoms: chills, cough, diarrhea, fatigue, fever, nausea and vomiting   Associated symptoms: no chest pain, no dysuria, no hematuria and no shortness of breath   Emesis Severity:  Moderate Duration:  5 hours Timing:  Intermittent Number of daily episodes:  3 Progression:  Unchanged Chronicity:  New Relieved by:  None tried Worsened by:  Nothing tried Ineffective treatments:  None tried Associated symptoms: abdominal pain, chills, diarrhea and myalgias   Diarrhea Quality:  Semi-solid Severity:  Moderate Onset quality:  Sudden Duration:  5 hours Timing:  Intermittent Progression:  Unchanged Relieved by:  None tried Worsened by:  Nothing tried Ineffective treatments:  None tried Associated symptoms: abdominal pain, chills, fever, myalgias and vomiting     HPI Comments: [name] is a 52 y.o. male who presents to the Emergency Department complaining of gradual onset, gradually worsening sharp right sided abdominal pain that started 4 days ago. Pt rates the pain 8/10. He has had aching abdominal pain for about one year. Pt states nausea, emesis and diarrhea started this morning. He is also having fever and chills. Pt states the  abdominal pain has worsened this morning since 9 AM and is worsened by movement. He states the pain radiates to his back. Pt has had 3 episodes of emesis this morning. He has had a decreased appetite and feels fatigued. Pt has congestion, cough and a rash that's been there for one year. Pt denies CP, SOB, bleeding easily, dysuria and hematuria as associated symptoms. Pt was told he has a gallstone but does not need to have it taken out. His sugars normally run between 200-300.  PCP is at [facility]  Past Medical History  Diagnosis Date  . Hypertension   . Diabetes mellitus   . GERD (gastroesophageal reflux disease)     DEC 2010 EGD/Bx REACTIVE GASTROPATHY, NO VARICES  . Hemorrhoids, internal   . BMI 40.0-44.9, adult OCT 2010 269 LBS    APR 2012 279 LBS  . 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 IV drug abuse REMOTE  . Hepatitis 2010 HEP C    AST 509 ALT 267 ALK PHOS 165 ALB 3.8 NEG IGM HAV/HBSAg  . Gallstone AUG 2012 1 CM  . GERD 10/25/2009  . COPD (chronic obstructive pulmonary disease)   . Hepatitis C   . Pancytopenia 2013  . Splenomegaly 2013  . Other pancytopenia 02/18/2013  . Iron (Fe) deficiency anemia 02/18/2013  . Splenomegaly 02/18/2013   Past Surgical History  Procedure Laterality Date  . Sigmoidoscopy      2001 DR. FLEISCHMAN INTERNAL HERMORRHOIDS  . Upper gastrointestinal endoscopy  DEC 2010    BENIGN POLYPS,  GASTRITIS, ?phg  . Knee surgery  RIGHT  . Hemorrhoid surgery    . Esophageal biopsy  09/08/2011    Procedure: BIOPSY;  Surgeon: Arlyce Harman, MD;  Location: [facility];  Service: Endoscopy;;  Gastric Biopsies and Gastric Polyp Biopsy   Family History  Problem Relation Age of Onset  . Colon cancer Neg Hx   . Anesthesia problems Neg Hx   . Hypotension Neg Hx   . Malignant hyperthermia Neg Hx   . Pseudochol deficiency Neg Hx   . Cancer Father    History  Substance Use Topics  . Smoking status: Never Smoker   . Smokeless tobacco:  Never Used  . Alcohol Use: No     Comment: 30 years ago    Review of Systems  Constitutional: Positive for fever, chills, appetite change and fatigue.  HENT: Positive for congestion and neck pain.   Respiratory: Positive for cough. Negative for shortness of breath.   Cardiovascular: Negative for chest pain.  Gastrointestinal: Positive for nausea, vomiting, abdominal pain and diarrhea.  Genitourinary: Negative for dysuria and hematuria.  Musculoskeletal: Positive for myalgias and back pain.  Skin: Positive for rash.  Hematological: Does not bruise/bleed easily.  Psychiatric/Behavioral: Negative for confusion.  All other systems reviewed and are negative.    Allergies  Review of patient's allergies indicates no known allergies.  Home Medications   Current Outpatient Rx  Name  Route  Sig  Dispense  Refill  . albuterol (PROVENTIL HFA;VENTOLIN HFA) 108 (90 BASE) MCG/ACT inhaler   Inhalation   Inhale 2 puffs into the lungs every 6 (six) hours as needed. Shortness of breath         . esomeprazole (NEXIUM) 40 MG capsule   Oral   Take 1 capsule (40 mg total) by mouth 2 (two) times daily. One po once to twice daily before a meal.   60 capsule   5   . glipiZIDE (GLUCOTROL) 10 MG tablet   Oral   Take 10 mg by mouth 2 (two) times daily before a meal.           . insulin glargine (LANTUS) 100 UNIT/ML injection   Subcutaneous   Inject 10 Units into the skin at bedtime.         . metFORMIN (GLUCOPHAGE) 1000 MG tablet   Oral   Take 1,000 mg by mouth 2 (two) times daily with a meal.         . metoprolol (LOPRESSOR) 50 MG tablet   Oral   Take 50 mg by mouth 2 (two) times daily.         . quinapril (ACCUPRIL) 20 MG tablet   Oral   Take 40 mg by mouth at bedtime.           BP 114/47  Pulse 97  Temp(Src) 99.6 F (37.6 C) (Oral)  Resp 22  Wt 290 lb (131.543 kg)  BMI 42.81 kg/m2  SpO2 95%  Physical Exam  Nursing note and vitals reviewed. Constitutional: He is  oriented to person, place, and time. He appears well-developed and well-nourished. No distress.  HENT:  Head: Normocephalic and atraumatic.  Mucous membranes mildly dry.   Eyes: Conjunctivae and EOM are normal.  Neck: Normal range of motion. Neck supple. No tracheal deviation present.  Cardiovascular: Normal rate, regular rhythm and normal heart sounds.   No murmur heard. Pulmonary/Chest: Effort normal and breath sounds normal. No respiratory distress. He has no wheezes. He has no rales.  Lungs clear  bilaterally.   Abdominal: Soft. There is no tenderness.  Bowel sounds are present but are decreased. Mildly tender to RUQ and RLQ.   Musculoskeletal: Normal range of motion.  Neurological: He is alert and oriented to person, place, and time.  Skin: Skin is warm and dry.  Psychiatric: He has a normal mood and affect. His behavior is normal.    ED Course   Procedures (including critical care time)  Medications  0.9 %  sodium chloride infusion ( Intravenous New Bag/Given 03/20/13 1532)  0.9 %  sodium chloride infusion (not administered)  HYDROmorphone (DILAUDID) injection 1 mg (not administered)  ondansetron (ZOFRAN) injection 4 mg (not administered)  sodium chloride 0.9 % bolus 1,000 mL (0 mLs Intravenous Stopped 03/20/13 1531)  HYDROmorphone (DILAUDID) injection 1 mg (1 mg Intravenous Given 03/20/13 1403)  ondansetron (ZOFRAN) injection 4 mg (4 mg Intravenous Given 03/20/13 1402)  iohexol (OMNIPAQUE) 300 MG/ML solution 50 mL (50 mLs Oral Contrast Given 03/20/13 1425)  iohexol (OMNIPAQUE) 300 MG/ML solution 100 mL (100 mLs Intravenous Contrast Given 03/20/13 1505)    DIAGNOSTIC STUDIES: Oxygen Saturation is 95% on RA, adequate by my interpretation.    COORDINATION OF CARE: 1:44 PM-Discussed treatment plan which includes CT scan, IV fluids, and nausea medication with pt at bedside and pt agreed to plan.   Labs Reviewed  LIPASE, BLOOD - Abnormal; Notable for the following:    Lipase 76  (*)    All other components within normal limits  COMPREHENSIVE METABOLIC PANEL - Abnormal; Notable for the following:    Sodium 132 (*)    Glucose, Bld 251 (*)    Albumin 3.2 (*)    AST 301 (*)    ALT 161 (*)    Alkaline Phosphatase 131 (*)    Total Bilirubin 2.8 (*)    GFR calc non Af Amer 83 (*)    All other components within normal limits  CBC WITH DIFFERENTIAL - Abnormal; Notable for the following:    RBC 4.19 (*)    Platelets 63 (*)    Neutrophils Relative % 86 (*)    Lymphocytes Relative 4 (*)    Lymphs Abs 0.3 (*)    All other components within normal limits  AMMONIA - Abnormal; Notable for the following:    Ammonia 107 (*)    All other components within normal limits  PROTIME-INR  APTT  URINALYSIS, ROUTINE W REFLEX MICROSCOPIC   Results for orders placed during the hospital encounter of 03/20/13  LIPASE, BLOOD      Result Value Range   Lipase 76 (*) 11 - 59 U/L  COMPREHENSIVE METABOLIC PANEL      Result Value Range   Sodium 132 (*) 135 - 145 mEq/L   Potassium 4.6  3.5 - 5.1 mEq/L   Chloride 101  96 - 112 mEq/L   CO2 22  19 - 32 mEq/L   Glucose, Bld 251 (*) 70 - 99 mg/dL   BUN 15  6 - 23 mg/dL   Creatinine, Ser 1.61  0.50 - 1.35 mg/dL   Calcium 8.9  8.4 - 09.6 mg/dL   Total Protein 7.5  6.0 - 8.3 g/dL   Albumin 3.2 (*) 3.5 - 5.2 g/dL   AST 045 (*) 0 - 37 U/L   ALT 161 (*) 0 - 53 U/L   Alkaline Phosphatase 131 (*) 39 - 117 U/L   Total Bilirubin 2.8 (*) 0.3 - 1.2 mg/dL   GFR calc non Af Amer 83 (*) >90 mL/min  GFR calc Af Amer >90  >90 mL/min  PROTIME-INR      Result Value Range   Prothrombin Time 14.6  11.6 - 15.2 seconds   INR 1.16  0.00 - 1.49  CBC WITH DIFFERENTIAL      Result Value Range   WBC 8.0  4.0 - 10.5 K/uL   RBC 4.19 (*) 4.22 - 5.81 MIL/uL   Hemoglobin 13.5  13.0 - 17.0 g/dL   HCT 16.1  09.6 - 04.5 %   MCV 96.7  78.0 - 100.0 fL   MCH 32.2  26.0 - 34.0 pg   MCHC 33.3  30.0 - 36.0 g/dL   RDW 40.9  81.1 - 91.4 %   Platelets 63 (*) 150 -  400 K/uL   Neutrophils Relative % 86 (*) 43 - 77 %   Lymphocytes Relative 4 (*) 12 - 46 %   Monocytes Relative 10  3 - 12 %   Eosinophils Relative 0  0 - 5 %   Basophils Relative 0  0 - 1 %   Neutro Abs 6.9  1.7 - 7.7 K/uL   Lymphs Abs 0.3 (*) 0.7 - 4.0 K/uL   Monocytes Absolute 0.8  0.1 - 1.0 K/uL   Eosinophils Absolute 0.0  0.0 - 0.7 K/uL   Basophils Absolute 0.0  0.0 - 0.1 K/uL   WBC Morphology TOXIC GRANULATION     Smear Review PLATELETS APPEAR DECREASED    AMMONIA      Result Value Range   Ammonia 107 (*) 11 - 60 umol/L  APTT      Result Value Range   aPTT 31  24 - 37 seconds      Dg Chest 2 View  03/20/2013   *RADIOLOGY REPORT*  Clinical Data: Weakness and cough  CHEST - 2 VIEW  Comparison: 04/08/2004  Findings: Lungs are under aerated and clear.  Normal heart size. No pneumothorax.  No pleural effusion.  IMPRESSION: No active cardiopulmonary disease.   Original Report Authenticated By: Jolaine Click, M.D.   Ct Abdomen Pelvis W Contrast  03/20/2013   *RADIOLOGY REPORT*  Clinical Data: The abdominal pain.  Nausea vomiting.  Diabetes. Cirrhosis.  Hepatitis C.  CT ABDOMEN AND PELVIS WITH CONTRAST  Technique:  Multidetector CT imaging of the abdomen and pelvis was performed following the standard protocol during bolus administration of intravenous contrast.  Contrast: 50mL OMNIPAQUE IOHEXOL 300 MG/ML  SOLN, OMNIPAQUE IOHEXOL 300 MG/ML  SOLN  Comparison: 10/13/2012  Findings: Stable mildly prominent epicardial lymph nodes.  Trace perihepatic ascites.  Lobulated liver contour compatible with cirrhosis.  Prominent but stable splenomegaly noted with considerable varices indicating portal venous hypertension.  No portal vein thrombosis observed.  Small dependent gallstones in the gallbladder.  Adrenal glands and pancreas appear unremarkable.  Portacaval node short axis 1.6 cm, formerly the same.  Scattered small mesenteric and retroperitoneal lymph nodes, similar to prior.  Mild stranding  at the root the mesentery, unchanged.  There is trace fluid tracking in the right paracolic gutter. Appendix normal where visualized.  Orally administered contrast is only in the duodenum and proximal jejunum at the time of imaging.  Small hypodense lesions in the right kidney are likely cyst but technically nonspecific.  There is a 3 x 1 mm left kidney lower pole nonobstructive calculus and a 1 mm left mid kidney nonobstructive calculus.  No hydronephrosis or hydroureter. Urinary bladder unremarkable.  Small amount of pelvic ascites. Left later renal vein collateral to the left gonadal  vein observed.  There is no appreciable excretion of contrast medium from the kidneys on the delayed phase images.  IMPRESSION:  1.  Abnormal bilateral delayed nephrogram, with lack of excretion of contrast into the collecting systems 4 minutes out from contrast injection.  Differential diagnostic considerations include acute tubular necrosis and acute glomerulonephritis.  I telephoned the radiology department and the technologist noted that the patient was responding and interacting normally after the scan, making hypotensive shock as a cause for the delayed nephrogram unlikely. 2.  Nonobstructive left nephrolithiasis. 3.  Cirrhosis with stable splenomegaly and collaterals.  Mild ascites potentially from hypoalbuminemia.  4.  borderline prominent lymph nodes, similar to prior and nonspecific. 5.  Cholelithiasis.   Original Report Authenticated By: Gaylyn Rong, M.D.   1. CIRRHOSIS   2. Biliary colic     MDM  Patient with persistent tenderness in the right upper quadrant suspect the prolonged biliary colic onset would've been 9:00 this morning. Also some change in his bilirubin a doubling ammonia level is elevated nothing to compare that with lipase is also elevated some. Patient's GI doctors Dr. Darrick Penna. Discuss with Dr. Lovell Sheehan from general surgery for consultation. Prior that had discussed with hospitalist admitting  team they will admit him to be n.p.o. try to calm down the gallbladder and also correct some of his other liver abnormalities. Patient currently is mentating fine still is tender in the right upper quadrant there is no leukocytosis no further vomiting.        I personally performed the services described in this documentation, which was scribed in my presence. The recorded information has been reviewed and is accurate.    Shelda Jakes, MD 03/20/13 [phone]

## 2013-03-21 ENCOUNTER — Inpatient Hospital Stay (HOSPITAL_COMMUNITY): Payer: Medicaid Other

## 2013-03-21 DIAGNOSIS — B182 Chronic viral hepatitis C: Secondary | ICD-10-CM

## 2013-03-21 DIAGNOSIS — E119 Type 2 diabetes mellitus without complications: Secondary | ICD-10-CM

## 2013-03-21 LAB — COMPREHENSIVE METABOLIC PANEL
AST: 176 U/L — ABNORMAL HIGH (ref 0–37)
Albumin: 2.8 g/dL — ABNORMAL LOW (ref 3.5–5.2)
Alkaline Phosphatase: 93 U/L (ref 39–117)
BUN: 26 mg/dL — ABNORMAL HIGH (ref 6–23)
Chloride: 107 mEq/L (ref 96–112)
Potassium: 4.7 mEq/L (ref 3.5–5.1)
Total Protein: 6.4 g/dL (ref 6.0–8.3)

## 2013-03-21 LAB — URINALYSIS, ROUTINE W REFLEX MICROSCOPIC
Ketones, ur: NEGATIVE mg/dL
Ketones, ur: NEGATIVE mg/dL
Leukocytes, UA: NEGATIVE
Leukocytes, UA: NEGATIVE
Nitrite: POSITIVE — AB
Nitrite: NEGATIVE

## 2013-03-21 LAB — CBC
HCT: 33.3 % — ABNORMAL LOW (ref 39.0–52.0)
MCH: 32.7 pg (ref 26.0–34.0)
MCV: 96.2 fL (ref 78.0–100.0)
RBC: 3.46 MIL/uL — ABNORMAL LOW (ref 4.22–5.81)
RDW: 14.9 % (ref 11.5–15.5)
WBC: 6.7 10*3/uL (ref 4.0–10.5)

## 2013-03-21 LAB — GLUCOSE, CAPILLARY
Glucose-Capillary: 113 mg/dL — ABNORMAL HIGH (ref 70–99)
Glucose-Capillary: 141 mg/dL — ABNORMAL HIGH (ref 70–99)

## 2013-03-21 LAB — URINE MICROSCOPIC-ADD ON

## 2013-03-21 MED ORDER — DIPHENHYDRAMINE HCL 25 MG PO CAPS
25.0000 mg | ORAL_CAPSULE | Freq: Four times a day (QID) | ORAL | Status: DC | PRN
  Administered 2013-03-21: 25 mg via ORAL
  Filled 2013-03-21: qty 1

## 2013-03-21 MED ORDER — SODIUM CHLORIDE 0.9 % IV SOLN
INTRAVENOUS | Status: DC
  Administered 2013-03-22: 02:00:00 via INTRAVENOUS

## 2013-03-21 NOTE — Consult Note (Signed)
Reason for Consult: Right sided abdominal pain, history of cholelithiasis Referring Physician: [facility]  [name] is an 52 y.o. male.  HPI: Patient is a 52 year old white male well medical problems including cirrhosis with splenomegaly, hepatitis C, and elevated ammonia level who has a known history of cholelithiasis. He was last seen by [facility] and they felt that cholecystectomy would be very risky for him given his multiple medical problems. He presents with a several week history of worsening right-sided abdominal pain. He states it seems to start in the right flank and radiates around to the right upper quadrant. Some nausea as noted, though no vomiting. This pain is somewhat different than what he has experienced in the past. He denies any fever or chills.  Past Medical History  Diagnosis Date  . Hypertension   . Diabetes mellitus   . GERD (gastroesophageal reflux disease)     DEC 2010 EGD/Bx REACTIVE GASTROPATHY, NO VARICES  . Hemorrhoids, internal   . BMI 40.0-44.9, adult OCT 2010 269 LBS    APR 2012 279 LBS  . 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 IV drug abuse REMOTE  . Hepatitis 2010 HEP C    AST 509 ALT 267 ALK PHOS 165 ALB 3.8 NEG IGM HAV/HBSAg  . Gallstone AUG 2012 1 CM  . GERD 10/25/2009  . COPD (chronic obstructive pulmonary disease)   . Hepatitis C   . Pancytopenia 2013  . Splenomegaly 2013  . Other pancytopenia 02/18/2013  . Iron (Fe) deficiency anemia 02/18/2013  . Splenomegaly 02/18/2013    Past Surgical History  Procedure Laterality Date  . Sigmoidoscopy      2001 DR. FLEISCHMAN INTERNAL HERMORRHOIDS  . Upper gastrointestinal endoscopy  DEC 2010    BENIGN POLYPS, GASTRITIS, ?phg  . Knee surgery  RIGHT  . Hemorrhoid surgery    . Esophageal biopsy  09/08/2011    Procedure: BIOPSY;  Surgeon: Arlyce Harman, MD;  Location: [facility];  Service: Endoscopy;;  Gastric Biopsies and Gastric Polyp Biopsy    Family History  Problem  Relation Age of Onset  . Colon cancer Neg Hx   . Anesthesia problems Neg Hx   . Hypotension Neg Hx   . Malignant hyperthermia Neg Hx   . Pseudochol deficiency Neg Hx   . Cancer Father     Social History:  reports that he has never smoked. He has never used smokeless tobacco. He reports that he uses illicit drugs (Marijuana and Cocaine). He reports that he does not drink alcohol.  Allergies: No Known Allergies  Medications: I have reviewed the patient's current medications.  Results for orders placed during the hospital encounter of 03/20/13 (from the past 48 hour(s))  LIPASE, BLOOD     Status: Abnormal   Collection Time    03/20/13  1:56 PM      Result Value Range   Lipase 76 (*) 11 - 59 U/L  COMPREHENSIVE METABOLIC PANEL     Status: Abnormal   Collection Time    03/20/13  1:56 PM      Result Value Range   Sodium 132 (*) 135 - 145 mEq/L   Potassium 4.6  3.5 - 5.1 mEq/L   Chloride 101  96 - 112 mEq/L   CO2 22  19 - 32 mEq/L   Glucose, Bld 251 (*) 70 - 99 mg/dL   BUN 15  6 - 23 mg/dL   Creatinine, Ser 4.09  0.50 - 1.35 mg/dL  Calcium 8.9  8.4 - 10.5 mg/dL   Total Protein 7.5  6.0 - 8.3 g/dL   Albumin 3.2 (*) 3.5 - 5.2 g/dL   AST 413 (*) 0 - 37 U/L   ALT 161 (*) 0 - 53 U/L   Alkaline Phosphatase 131 (*) 39 - 117 U/L   Total Bilirubin 2.8 (*) 0.3 - 1.2 mg/dL   GFR calc non Af Amer 83 (*) >90 mL/min   GFR calc Af Amer >90  >90 mL/min   Comment:            The eGFR has been calculated     using the CKD EPI equation.     This calculation has not been     validated in all clinical     situations.     eGFR's persistently     <90 mL/min signify     possible Chronic Kidney Disease.  PROTIME-INR     Status: None   Collection Time    03/20/13  1:56 PM      Result Value Range   Prothrombin Time 14.6  11.6 - 15.2 seconds   INR 1.16  0.00 - 1.49  CBC WITH DIFFERENTIAL     Status: Abnormal   Collection Time    03/20/13  1:56 PM      Result Value Range   WBC 8.0  4.0 - 10.5  K/uL   RBC 4.19 (*) 4.22 - 5.81 MIL/uL   Hemoglobin 13.5  13.0 - 17.0 g/dL   HCT 24.4  01.0 - 27.2 %   MCV 96.7  78.0 - 100.0 fL   MCH 32.2  26.0 - 34.0 pg   MCHC 33.3  30.0 - 36.0 g/dL   RDW 53.6  64.4 - 03.4 %   Platelets 63 (*) 150 - 400 K/uL   Neutrophils Relative % 86 (*) 43 - 77 %   Lymphocytes Relative 4 (*) 12 - 46 %   Monocytes Relative 10  3 - 12 %   Eosinophils Relative 0  0 - 5 %   Basophils Relative 0  0 - 1 %   Neutro Abs 6.9  1.7 - 7.7 K/uL   Lymphs Abs 0.3 (*) 0.7 - 4.0 K/uL   Monocytes Absolute 0.8  0.1 - 1.0 K/uL   Eosinophils Absolute 0.0  0.0 - 0.7 K/uL   Basophils Absolute 0.0  0.0 - 0.1 K/uL   WBC Morphology TOXIC GRANULATION     Smear Review PLATELETS APPEAR DECREASED    APTT     Status: None   Collection Time    03/20/13  1:56 PM      Result Value Range   aPTT 31  24 - 37 seconds  AMMONIA     Status: Abnormal   Collection Time    03/20/13  2:23 PM      Result Value Range   Ammonia 107 (*) 11 - 60 umol/L  GLUCOSE, CAPILLARY     Status: Abnormal   Collection Time    03/20/13  6:46 PM      Result Value Range   Glucose-Capillary 200 (*) 70 - 99 mg/dL   Comment 1 Notify RN     Comment 2 Documented in Chart    GLUCOSE, CAPILLARY     Status: Abnormal   Collection Time    03/20/13  9:08 PM      Result Value Range   Glucose-Capillary 177 (*) 70 - 99 mg/dL  COMPREHENSIVE METABOLIC PANEL  Status: Abnormal   Collection Time    03/21/13  6:16 AM      Result Value Range   Sodium 135  135 - 145 mEq/L   Potassium 4.7  3.5 - 5.1 mEq/L   Chloride 107  96 - 112 mEq/L   CO2 22  19 - 32 mEq/L   Glucose, Bld 157 (*) 70 - 99 mg/dL   BUN 26 (*) 6 - 23 mg/dL   Comment: DELTA CHECK NOTED   Creatinine, Ser 1.06  0.50 - 1.35 mg/dL   Calcium 7.8 (*) 8.4 - 10.5 mg/dL   Total Protein 6.4  6.0 - 8.3 g/dL   Albumin 2.8 (*) 3.5 - 5.2 g/dL   AST 161 (*) 0 - 37 U/L   ALT 118 (*) 0 - 53 U/L   Alkaline Phosphatase 93  39 - 117 U/L   Total Bilirubin 2.8 (*) 0.3 - 1.2  mg/dL   GFR calc non Af Amer 80 (*) >90 mL/min   GFR calc Af Amer >90  >90 mL/min   Comment:            The eGFR has been calculated     using the CKD EPI equation.     This calculation has not been     validated in all clinical     situations.     eGFR's persistently     <90 mL/min signify     possible Chronic Kidney Disease.  CBC     Status: Abnormal   Collection Time    03/21/13  6:16 AM      Result Value Range   WBC 6.7  4.0 - 10.5 K/uL   RBC 3.46 (*) 4.22 - 5.81 MIL/uL   Hemoglobin 11.3 (*) 13.0 - 17.0 g/dL   HCT 09.6 (*) 04.5 - 40.9 %   MCV 96.2  78.0 - 100.0 fL   MCH 32.7  26.0 - 34.0 pg   MCHC 33.9  30.0 - 36.0 g/dL   RDW 81.1  91.4 - 78.2 %   Platelets 46 (*) 150 - 400 K/uL   Comment: DELTA CHECK NOTED     SPECIMEN CHECKED FOR CLOTS     RESULT REPEATED AND VERIFIED     PLATELET COUNT CONFIRMED BY SMEAR  URINALYSIS, ROUTINE W REFLEX MICROSCOPIC     Status: Abnormal   Collection Time    03/21/13  7:26 AM      Result Value Range   Color, Urine ORANGE (*) YELLOW   Comment: BIOCHEMICALS MAY BE AFFECTED BY COLOR   APPearance CLEAR  CLEAR   Specific Gravity, Urine >1.030 (*) 1.005 - 1.030   pH 5.5  5.0 - 8.0   Glucose, UA NEGATIVE  NEGATIVE mg/dL   Hgb urine dipstick MODERATE (*) NEGATIVE   Bilirubin Urine SMALL (*) NEGATIVE   Ketones, ur NEGATIVE  NEGATIVE mg/dL   Protein, ur TRACE (*) NEGATIVE mg/dL   Urobilinogen, UA 1.0  0.0 - 1.0 mg/dL   Nitrite POSITIVE (*) NEGATIVE   Leukocytes, UA NEGATIVE  NEGATIVE  URINE MICROSCOPIC-ADD ON     Status: Abnormal   Collection Time    03/21/13  7:26 AM      Result Value Range   RBC / HPF 7-10  <3 RBC/hpf   Bacteria, UA MANY (*) RARE    Dg Chest 2 View  03/20/2013   *RADIOLOGY REPORT*  Clinical Data: Weakness and cough  CHEST - 2 VIEW  Comparison: 04/08/2004  Findings: Lungs are under  aerated and clear.  Normal heart size. No pneumothorax.  No pleural effusion.  IMPRESSION: No active cardiopulmonary disease.   Original  Report Authenticated By: Jolaine Click, M.D.   Ct Abdomen Pelvis W Contrast  03/20/2013   *RADIOLOGY REPORT*  Clinical Data: The abdominal pain.  Nausea vomiting.  Diabetes. Cirrhosis.  Hepatitis C.  CT ABDOMEN AND PELVIS WITH CONTRAST  Technique:  Multidetector CT imaging of the abdomen and pelvis was performed following the standard protocol during bolus administration of intravenous contrast.  Contrast: 50mL OMNIPAQUE IOHEXOL 300 MG/ML  SOLN, OMNIPAQUE IOHEXOL 300 MG/ML  SOLN  Comparison: 10/13/2012  Findings: Stable mildly prominent epicardial lymph nodes.  Trace perihepatic ascites.  Lobulated liver contour compatible with cirrhosis.  Prominent but stable splenomegaly noted with considerable varices indicating portal venous hypertension.  No portal vein thrombosis observed.  Small dependent gallstones in the gallbladder.  Adrenal glands and pancreas appear unremarkable.  Portacaval node short axis 1.6 cm, formerly the same.  Scattered small mesenteric and retroperitoneal lymph nodes, similar to prior.  Mild stranding at the root the mesentery, unchanged.  There is trace fluid tracking in the right paracolic gutter. Appendix normal where visualized.  Orally administered contrast is only in the duodenum and proximal jejunum at the time of imaging.  Small hypodense lesions in the right kidney are likely cyst but technically nonspecific.  There is a 3 x 1 mm left kidney lower pole nonobstructive calculus and a 1 mm left mid kidney nonobstructive calculus.  No hydronephrosis or hydroureter. Urinary bladder unremarkable.  Small amount of pelvic ascites. Left later renal vein collateral to the left gonadal vein observed.  There is no appreciable excretion of contrast medium from the kidneys on the delayed phase images.  IMPRESSION:  1.  Abnormal bilateral delayed nephrogram, with lack of excretion of contrast into the collecting systems 4 minutes out from contrast injection.  Differential diagnostic  considerations include acute tubular necrosis and acute glomerulonephritis.  I telephoned the radiology department and the technologist noted that the patient was responding and interacting normally after the scan, making hypotensive shock as a cause for the delayed nephrogram unlikely. 2.  Nonobstructive left nephrolithiasis. 3.  Cirrhosis with stable splenomegaly and collaterals.  Mild ascites potentially from hypoalbuminemia.  4.  borderline prominent lymph nodes, similar to prior and nonspecific. 5.  Cholelithiasis.   Original Report Authenticated By: Gaylyn Rong, M.D.   Dg Abd Portable 1v  03/20/2013   *RADIOLOGY REPORT*  Clinical Data: Delayed nephrogram, assess passage of contrast  PORTABLE ABDOMEN - 1 VIEW  Comparison: 03/20/2013 CT  Findings: The left kidney is incompletely visualized.  Contrast has passed distally into the bladder, with partial visualization of the distal ureters bilaterally.  Persistent right nephrogram is re- identified.  Contrast within nondilated bowel is re-identified.  IMPRESSION: Some interval passage of contrast into the bladder is noted with partial visualization of the distal ureters bilaterally, but with persistent right nephrogram.  Previous differential considerations are as per prior report.   Original Report Authenticated By: Christiana Pellant, M.D.    ROS: See chart Blood pressure 124/67, pulse 67, temperature 97.7 F (36.5 C), temperature source Axillary, resp. rate 22, height 5\' 6"  (1.676 m), weight 131.4 kg (289 lb 11 oz), SpO2 97.00%. Physical Exam: Pleasant white male no acute distress. Abdomen: Soft but distended. Enlarged liver is noted along the right side of the abdomen. Difficult to feel spleen tip. No rigidity noted. No specific point tenderness noted.   Assessment/Plan: Impression: Cirrhosis,  hepatitis C, splenomegaly, thrombocytopenia, hypoalbuminemia, cholelithiasis, delayed right nephrogram of unknown etiology. Patient is at significantly  higher risk for complications from a cholecystectomy. I do not believe he has acute cholecystitis. Should he require surgery, this would need to be done at a tertiary care center. I do not think he needs surgery at this time. Ultrasound is pending. Will continue to follow patient with you. His elevated bilirubin level may be secondary to progression of his cirrhosis.  Suhaylah Wampole A 03/21/2013, 11:22 AM

## 2013-03-21 NOTE — Progress Notes (Addendum)
Patient seen, independently examined and chart reviewed. I agree with exam, assessment and plan discussed with Toya Smothers, NP.  He feels better today. He is quite hungry. Vomiting has resolved. Abdominal pain is better today. Voiding fine. Afebrile, vital signs stable. On exam he appears better today, more comfortable. Abdomen is obese but soft, there is some mid, right upper quadrant and right lower quadrant pain. Although he endorses CVA tenderness on the right this is not appreciated on exam. Complete metabolic panel without significant change, CBC stable. Urinalysis was equivocal.  Abdominal ultrasound negative for acute findings. Gallbladder appeared unremarkable as did the kidneys. Abdominal x-ray last night revealed persistent right nephrogram.  Etiology of acute on chronic abdominal pain remains obscure but he is clearly better today. This may be secondary to his cirrhosis. There is no evidence to suggest acute gallbladder pathology, intra-abdominal process or renal process. The etiology of persistent nephrogram is unclear as is the significance in patient with normal renal function and normal appearance of kidneys. Continue pain control. Advance diet. Monitor overnight. Repeat laboratory studies and abdominal imaging in the morning. If remains improved consider discharge home.  Send urine for culture  Discussed with Dr. Jerre Simon, he will see in consult, consider repeat CT or further urologic investigation.  Brendia Sacks, MD [facility] [phone]

## 2013-03-21 NOTE — Consult Note (Signed)
Reason for Consult:Renal failure/nephrogram Referring Physician: Dr. Mauri Castillo is an 52 y.o. male.  HPI: Patient with long-standing history of diabetes, hypertension, hepatitis C and liver cirrhosis. Presently patient came with a right-sided flank pain with some radiation to his lower abdomen. Patient denies any fever chills or sweating. Presently he seems to be feeling better. He has history of kidney stone and passed stone at one time. Presently seems to be feeling better. His renal function presently seems to be stable however CT scan showed nephrogram during the study.Even though patient was given the contrast his creatinine is only increased slightly. Presently patient is asymptomatic. Patient has previous history of intermittent leg swelling.  Past Medical History  Diagnosis Date  . Hypertension   . Diabetes mellitus   . GERD (gastroesophageal reflux disease)     DEC 2010 EGD/Bx REACTIVE GASTROPATHY, NO VARICES  . Hemorrhoids, internal   . BMI 40.0-44.9, adult OCT 2010 269 LBS    APR 2012 279 LBS  . 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 IV drug abuse REMOTE  . Hepatitis 2010 HEP C    AST 509 ALT 267 ALK PHOS 165 ALB 3.8 NEG IGM HAV/HBSAg  . Gallstone AUG 2012 1 CM  . GERD 10/25/2009  . COPD (chronic obstructive pulmonary disease)   . Hepatitis C   . Pancytopenia 2013  . Splenomegaly 2013  . Other pancytopenia 02/18/2013  . Iron (Fe) deficiency anemia 02/18/2013  . Splenomegaly 02/18/2013    Past Surgical History  Procedure Laterality Date  . Sigmoidoscopy      2001 DR. FLEISCHMAN INTERNAL HERMORRHOIDS  . Upper gastrointestinal endoscopy  DEC 2010    BENIGN POLYPS, GASTRITIS, ?phg  . Knee surgery  RIGHT  . Hemorrhoid surgery    . Esophageal biopsy  09/08/2011    Procedure: BIOPSY;  Surgeon: Arlyce Harman, MD;  Location: [facility];  Service: Endoscopy;;  Gastric Biopsies and Gastric Polyp Biopsy    Family History  Problem Relation Age of Onset   . Colon cancer Neg Hx   . Anesthesia problems Neg Hx   . Hypotension Neg Hx   . Malignant hyperthermia Neg Hx   . Pseudochol deficiency Neg Hx   . Cancer Father     Social History:  reports that he has never smoked. He has never used smokeless tobacco. He reports that he uses illicit drugs (Marijuana and Cocaine). He reports that he does not drink alcohol.  Allergies: No Known Allergies  Medications: I have reviewed the patient's current medications.  Results for orders placed during the hospital encounter of 03/20/13 (from the past 48 hour(s))  LIPASE, BLOOD     Status: Abnormal   Collection Time    03/20/13  1:56 PM      Result Value Range   Lipase 76 (*) 11 - 59 U/L  COMPREHENSIVE METABOLIC PANEL     Status: Abnormal   Collection Time    03/20/13  1:56 PM      Result Value Range   Sodium 132 (*) 135 - 145 mEq/L   Potassium 4.6  3.5 - 5.1 mEq/L   Chloride 101  96 - 112 mEq/L   CO2 22  19 - 32 mEq/L   Glucose, Bld 251 (*) 70 - 99 mg/dL   BUN 15  6 - 23 mg/dL   Creatinine, Ser 1.61  0.50 - 1.35 mg/dL   Calcium 8.9  8.4 - 09.6 mg/dL   Total  Protein 7.5  6.0 - 8.3 g/dL   Albumin 3.2 (*) 3.5 - 5.2 g/dL   AST 409 (*) 0 - 37 U/L   ALT 161 (*) 0 - 53 U/L   Alkaline Phosphatase 131 (*) 39 - 117 U/L   Total Bilirubin 2.8 (*) 0.3 - 1.2 mg/dL   GFR calc non Af Amer 83 (*) >90 mL/min   GFR calc Af Amer >90  >90 mL/min   Comment:            The eGFR has been calculated     using the CKD EPI equation.     This calculation has not been     validated in all clinical     situations.     eGFR's persistently     <90 mL/min signify     possible Chronic Kidney Disease.  PROTIME-INR     Status: None   Collection Time    03/20/13  1:56 PM      Result Value Range   Prothrombin Time 14.6  11.6 - 15.2 seconds   INR 1.16  0.00 - 1.49  CBC WITH DIFFERENTIAL     Status: Abnormal   Collection Time    03/20/13  1:56 PM      Result Value Range   WBC 8.0  4.0 - 10.5 K/uL   RBC 4.19 (*)  4.22 - 5.81 MIL/uL   Hemoglobin 13.5  13.0 - 17.0 g/dL   HCT 81.1  91.4 - 78.2 %   MCV 96.7  78.0 - 100.0 fL   MCH 32.2  26.0 - 34.0 pg   MCHC 33.3  30.0 - 36.0 g/dL   RDW 95.6  21.3 - 08.6 %   Platelets 63 (*) 150 - 400 K/uL   Neutrophils Relative % 86 (*) 43 - 77 %   Lymphocytes Relative 4 (*) 12 - 46 %   Monocytes Relative 10  3 - 12 %   Eosinophils Relative 0  0 - 5 %   Basophils Relative 0  0 - 1 %   Neutro Abs 6.9  1.7 - 7.7 K/uL   Lymphs Abs 0.3 (*) 0.7 - 4.0 K/uL   Monocytes Absolute 0.8  0.1 - 1.0 K/uL   Eosinophils Absolute 0.0  0.0 - 0.7 K/uL   Basophils Absolute 0.0  0.0 - 0.1 K/uL   WBC Morphology TOXIC GRANULATION     Smear Review PLATELETS APPEAR DECREASED    APTT     Status: None   Collection Time    03/20/13  1:56 PM      Result Value Range   aPTT 31  24 - 37 seconds  AMMONIA     Status: Abnormal   Collection Time    03/20/13  2:23 PM      Result Value Range   Ammonia 107 (*) 11 - 60 umol/L  GLUCOSE, CAPILLARY     Status: Abnormal   Collection Time    03/20/13  6:46 PM      Result Value Range   Glucose-Capillary 200 (*) 70 - 99 mg/dL   Comment 1 Notify RN     Comment 2 Documented in Chart    GLUCOSE, CAPILLARY     Status: Abnormal   Collection Time    03/20/13  9:08 PM      Result Value Range   Glucose-Capillary 177 (*) 70 - 99 mg/dL  COMPREHENSIVE METABOLIC PANEL     Status: Abnormal   Collection Time  03/21/13  6:16 AM      Result Value Range   Sodium 135  135 - 145 mEq/L   Potassium 4.7  3.5 - 5.1 mEq/L   Chloride 107  96 - 112 mEq/L   CO2 22  19 - 32 mEq/L   Glucose, Bld 157 (*) 70 - 99 mg/dL   BUN 26 (*) 6 - 23 mg/dL   Comment: DELTA CHECK NOTED   Creatinine, Ser 1.06  0.50 - 1.35 mg/dL   Calcium 7.8 (*) 8.4 - 10.5 mg/dL   Total Protein 6.4  6.0 - 8.3 g/dL   Albumin 2.8 (*) 3.5 - 5.2 g/dL   AST 161 (*) 0 - 37 U/L   ALT 118 (*) 0 - 53 U/L   Alkaline Phosphatase 93  39 - 117 U/L   Total Bilirubin 2.8 (*) 0.3 - 1.2 mg/dL   GFR calc non  Af Amer 80 (*) >90 mL/min   GFR calc Af Amer >90  >90 mL/min   Comment:            The eGFR has been calculated     using the CKD EPI equation.     This calculation has not been     validated in all clinical     situations.     eGFR's persistently     <90 mL/min signify     possible Chronic Kidney Disease.  CBC     Status: Abnormal   Collection Time    03/21/13  6:16 AM      Result Value Range   WBC 6.7  4.0 - 10.5 K/uL   RBC 3.46 (*) 4.22 - 5.81 MIL/uL   Hemoglobin 11.3 (*) 13.0 - 17.0 g/dL   HCT 09.6 (*) 04.5 - 40.9 %   MCV 96.2  78.0 - 100.0 fL   MCH 32.7  26.0 - 34.0 pg   MCHC 33.9  30.0 - 36.0 g/dL   RDW 81.1  91.4 - 78.2 %   Platelets 46 (*) 150 - 400 K/uL   Comment: DELTA CHECK NOTED     SPECIMEN CHECKED FOR CLOTS     RESULT REPEATED AND VERIFIED     PLATELET COUNT CONFIRMED BY SMEAR  URINALYSIS, ROUTINE W REFLEX MICROSCOPIC     Status: Abnormal   Collection Time    03/21/13  7:26 AM      Result Value Range   Color, Urine ORANGE (*) YELLOW   Comment: BIOCHEMICALS MAY BE AFFECTED BY COLOR   APPearance CLEAR  CLEAR   Specific Gravity, Urine >1.030 (*) 1.005 - 1.030   pH 5.5  5.0 - 8.0   Glucose, UA NEGATIVE  NEGATIVE mg/dL   Hgb urine dipstick MODERATE (*) NEGATIVE   Bilirubin Urine SMALL (*) NEGATIVE   Ketones, ur NEGATIVE  NEGATIVE mg/dL   Protein, ur TRACE (*) NEGATIVE mg/dL   Urobilinogen, UA 1.0  0.0 - 1.0 mg/dL   Nitrite POSITIVE (*) NEGATIVE   Leukocytes, UA NEGATIVE  NEGATIVE  URINE MICROSCOPIC-ADD ON     Status: Abnormal   Collection Time    03/21/13  7:26 AM      Result Value Range   RBC / HPF 7-10  <3 RBC/hpf   Bacteria, UA MANY (*) RARE    Dg Chest 2 View  03/20/2013   *RADIOLOGY REPORT*  Clinical Data: Weakness and cough  CHEST - 2 VIEW  Comparison: 04/08/2004  Findings: Lungs are under aerated and clear.  Normal heart size. No pneumothorax.  No pleural effusion.  IMPRESSION: No active cardiopulmonary disease.   Original Report Authenticated By:  Jolaine Click, M.D.   Ct Abdomen Pelvis W Contrast  03/20/2013   *RADIOLOGY REPORT*  Clinical Data: The abdominal pain.  Nausea vomiting.  Diabetes. Cirrhosis.  Hepatitis C.  CT ABDOMEN AND PELVIS WITH CONTRAST  Technique:  Multidetector CT imaging of the abdomen and pelvis was performed following the standard protocol during bolus administration of intravenous contrast.  Contrast: 50mL OMNIPAQUE IOHEXOL 300 MG/ML  SOLN, OMNIPAQUE IOHEXOL 300 MG/ML  SOLN  Comparison: 10/13/2012  Findings: Stable mildly prominent epicardial lymph nodes.  Trace perihepatic ascites.  Lobulated liver contour compatible with cirrhosis.  Prominent but stable splenomegaly noted with considerable varices indicating portal venous hypertension.  No portal vein thrombosis observed.  Small dependent gallstones in the gallbladder.  Adrenal glands and pancreas appear unremarkable.  Portacaval node short axis 1.6 cm, formerly the same.  Scattered small mesenteric and retroperitoneal lymph nodes, similar to prior.  Mild stranding at the root the mesentery, unchanged.  There is trace fluid tracking in the right paracolic gutter. Appendix normal where visualized.  Orally administered contrast is only in the duodenum and proximal jejunum at the time of imaging.  Small hypodense lesions in the right kidney are likely cyst but technically nonspecific.  There is a 3 x 1 mm left kidney lower Castillo nonobstructive calculus and a 1 mm left mid kidney nonobstructive calculus.  No hydronephrosis or hydroureter. Urinary bladder unremarkable.  Small amount of pelvic ascites. Left later renal vein collateral to the left gonadal vein observed.  There is no appreciable excretion of contrast medium from the kidneys on the delayed phase images.  IMPRESSION:  1.  Abnormal bilateral delayed nephrogram, with lack of excretion of contrast into the collecting systems 4 minutes out from contrast injection.  Differential diagnostic considerations include acute tubular  necrosis and acute glomerulonephritis.  I telephoned the radiology department and the technologist noted that the patient was responding and interacting normally after the scan, making hypotensive shock as a cause for the delayed nephrogram unlikely. 2.  Nonobstructive left nephrolithiasis. 3.  Cirrhosis with stable splenomegaly and collaterals.  Mild ascites potentially from hypoalbuminemia.  4.  borderline prominent lymph nodes, similar to prior and nonspecific. 5.  Cholelithiasis.   Original Report Authenticated By: Gaylyn Rong, M.D.   Dg Abd Portable 1v  03/20/2013   *RADIOLOGY REPORT*  Clinical Data: Delayed nephrogram, assess passage of contrast  PORTABLE ABDOMEN - 1 VIEW  Comparison: 03/20/2013 CT  Findings: The left kidney is incompletely visualized.  Contrast has passed distally into the bladder, with partial visualization of the distal ureters bilaterally.  Persistent right nephrogram is re- identified.  Contrast within nondilated bowel is re-identified.  IMPRESSION: Some interval passage of contrast into the bladder is noted with partial visualization of the distal ureters bilaterally, but with persistent right nephrogram.  Previous differential considerations are as per prior report.   Original Report Authenticated By: Christiana Pellant, M.D.    Review of Systems  Constitutional: Positive for chills.  Respiratory: Negative for shortness of breath and wheezing.   Gastrointestinal: Positive for abdominal pain. Negative for heartburn and nausea.  Genitourinary: Positive for flank pain.   Blood pressure 124/67, pulse 67, temperature 97.7 F (36.5 C), temperature source Axillary, resp. rate 22, weight 131.407 kg (289 lb 11.2 oz), SpO2 97.00%. Physical Exam  Constitutional: He is oriented to person, place, and time. No distress.  Eyes: No scleral icterus.  Neck: Neck  supple. No JVD present.  Cardiovascular: Normal rate, regular rhythm and normal heart sounds.   Respiratory: He is in  respiratory distress. He has wheezes. He has no rales.  GI: He exhibits distension. There is no tenderness. There is no rebound.  Musculoskeletal: He exhibits no edema.  Neurological: He is alert and oriented to person, place, and time.    Assessment/Plan: Problem #1 delayed perfusion on CT scan. Etiology as this moment is not clear. Patient with normal blood pressure and high urine specific gravity of 1.03. Prerenal/ATN/ACE inhibitor. Problem #2 history of liver cirrhosis Problem #3 hepatitis C infection Problem #4 diabetes Problem #5 proteinuria only trace. Since his urine is very concentrated very difficult to interpret. Problem #6 hypertension his blood pressure seems to be reasonably controlled Problem #7 iron deficiency anemia status post IV iron restriction. Problem #8 pancytopenia Problem #9 right upper quadrant abdominal pain. Plan: Increase IV fluid to  125 cc per hour We'll repeat his UA and check urine albumin to creatinine ratio. We'll check his basic metabolic panel in the morning. We'll try to get more information about his previous studies.  Tanush Drees S 03/21/2013, 8:30 AM

## 2013-03-21 NOTE — Progress Notes (Signed)
[facility] PROGRESS NOTE  [name] [name]:[id number] DOB: 1960-08-30 DOA: 03/20/2013 PCP: [name], PA-C  Assessment/Plan: 1.Right upper quadrant, lower outer abdominal pain: Etiology unclear. continue bowel rest, fluids, antiemetics. Little improvement today. Reports pain medicine adequate. Concern for  biliary colic. Pt remains afebrile with no leukocytosis. Liver disease makes interpretation of hepatic function panel difficult. No evidence of kidney stone.  2. Possible biliary colic,history of gallstones: Evaluated by Dr. Lovell Sheehan with general surgery.  He opined no evidence of acute cholecystitis. He also indicated that should patient need surgery he would need to be transferred to tertiary care center.  3.Elevated liver function tests, mixed pattern: Chronic elevation in transaminases and alkaline phosphatase noted. Bilirubin level higher than previous studies. Pt states he has been told "my liver is in end stages".  4.  Abnormal CT of the abdomen and pelvis: Delayed nephrogram. Discussed with the interpreting radiologist Dr. Ova Freshwater, etiology unclear, differential is as reported. Clearly the patient is not in shock and he has normal renal function. KUB yields some interval passage of contrast into the bladder is noted with  partial visualization of the distal ureters bilaterally, but with persistent right nephrogram. Renal function stable. Appreciate nephrology input. Fluids increased consult and urinalysis to be repeated.   5. Cirrhosis: Ammonia is elevated at 107 on admission. Mentation remains normal, does not appear to be acutely decompensated.  6. Diabetes mellitus:Fair control. Continue sliding scale insulin and. Lantus. Hold metformin and glipizide while hospitalized.  7. COPD: Remains stable at baseline.  8. GERD: Poorly controlled. Continue  PPI.  9.Chronic thrombocytopenia: Appears to be at baseline. Secondary to liver disease  Code Status: full Family  Communication: wife at bedsid Disposition Plan: home when ready   Consultants:  General surgery  nephrology  Procedures:  none  Antibiotics:  none  HPI/Subjective: Ambulating in room. Gait steady. Reports pain well controlled.   Objective: Filed Vitals:   03/21/13 0407  BP: 124/67  Pulse: 67  Temp: 97.7 F (36.5 C)  Resp: 22    Intake/Output Summary (Last 24 hours) at 03/21/13 1138 Last data filed at 03/21/13 0940  Gross per 24 hour  Intake      3 ml  Output      0 ml  Net      3 ml   Filed Weights   03/20/13 1845 03/21/13 0407 03/21/13 0924  Weight: 284 lb 13.4 oz (129.2 kg) 289 lb 11.2 oz (131.407 kg) 289 lb 11 oz (131.4 kg)    Exam:   General:  Obese NAD somewhat ill appearing  Cardiovascular: RRR No MGR No LE edema PPP  Respiratory: normal effort BS clear bilaterally to auscultation no wheeze  Abdomen: obese, slightly firm, +BS mild diffuse tenderness particularly right upper quadrant.   Musculoskeletal: no clubbing no cyanosis   Data Reviewed: Basic Metabolic Panel:  Recent Labs Lab 03/20/13 1356 03/21/13 0616  NA 132* 135  K 4.6 4.7  CL 101 107  CO2 22 22  GLUCOSE 251* 157*  BUN 15 26*  CREATININE 1.02 1.06  CALCIUM 8.9 7.8*   Liver Function Tests:  Recent Labs Lab 03/20/13 1356 03/21/13 0616  AST 301* 176*  ALT 161* 118*  ALKPHOS 131* 93  BILITOT 2.8* 2.8*  PROT 7.5 6.4  ALBUMIN 3.2* 2.8*    Recent Labs Lab 03/20/13 1356  LIPASE 76*    Recent Labs Lab 03/20/13 1423  AMMONIA 107*   CBC:  Recent Labs Lab 03/20/13 1356 03/21/13 0616  WBC 8.0 6.7  NEUTROABS 6.9  --   HGB 13.5 11.3*  HCT 40.5 33.3*  MCV 96.7 96.2  PLT 63* 46*   Cardiac Enzymes: No results found for this basename: CKTOTAL, CKMB, CKMBINDEX, TROPONINI,  in the last 168 hours BNP (last 3 results) No results found for this basename: PROBNP,  in the last 8760 hours CBG:  Recent Labs Lab 03/20/13 1846 03/20/13 2108  GLUCAP 200* 177*     No results found for this or any previous visit (from the past 240 hour(s)).   Studies: Dg Chest 2 View  03/20/2013   *RADIOLOGY REPORT*  Clinical Data: Weakness and cough  CHEST - 2 VIEW  Comparison: 04/08/2004  Findings: Lungs are under aerated and clear.  Normal heart size. No pneumothorax.  No pleural effusion.  IMPRESSION: No active cardiopulmonary disease.   Original Report Authenticated By: Jolaine Click, M.D.   Ct Abdomen Pelvis W Contrast  03/20/2013   *RADIOLOGY REPORT*  Clinical Data: The abdominal pain.  Nausea vomiting.  Diabetes. Cirrhosis.  Hepatitis C.  CT ABDOMEN AND PELVIS WITH CONTRAST  Technique:  Multidetector CT imaging of the abdomen and pelvis was performed following the standard protocol during bolus administration of intravenous contrast.  Contrast: 50mL OMNIPAQUE IOHEXOL 300 MG/ML  SOLN, OMNIPAQUE IOHEXOL 300 MG/ML  SOLN  Comparison: 10/13/2012  Findings: Stable mildly prominent epicardial lymph nodes.  Trace perihepatic ascites.  Lobulated liver contour compatible with cirrhosis.  Prominent but stable splenomegaly noted with considerable varices indicating portal venous hypertension.  No portal vein thrombosis observed.  Small dependent gallstones in the gallbladder.  Adrenal glands and pancreas appear unremarkable.  Portacaval node short axis 1.6 cm, formerly the same.  Scattered small mesenteric and retroperitoneal lymph nodes, similar to prior.  Mild stranding at the root the mesentery, unchanged.  There is trace fluid tracking in the right paracolic gutter. Appendix normal where visualized.  Orally administered contrast is only in the duodenum and proximal jejunum at the time of imaging.  Small hypodense lesions in the right kidney are likely cyst but technically nonspecific.  There is a 3 x 1 mm left kidney lower pole nonobstructive calculus and a 1 mm left mid kidney nonobstructive calculus.  No hydronephrosis or hydroureter. Urinary bladder unremarkable.  Small  amount of pelvic ascites. Left later renal vein collateral to the left gonadal vein observed.  There is no appreciable excretion of contrast medium from the kidneys on the delayed phase images.  IMPRESSION:  1.  Abnormal bilateral delayed nephrogram, with lack of excretion of contrast into the collecting systems 4 minutes out from contrast injection.  Differential diagnostic considerations include acute tubular necrosis and acute glomerulonephritis.  I telephoned the radiology department and the technologist noted that the patient was responding and interacting normally after the scan, making hypotensive shock as a cause for the delayed nephrogram unlikely. 2.  Nonobstructive left nephrolithiasis. 3.  Cirrhosis with stable splenomegaly and collaterals.  Mild ascites potentially from hypoalbuminemia.  4.  borderline prominent lymph nodes, similar to prior and nonspecific. 5.  Cholelithiasis.   Original Report Authenticated By: Gaylyn Rong, M.D.   Dg Abd Portable 1v  03/20/2013   *RADIOLOGY REPORT*  Clinical Data: Delayed nephrogram, assess passage of contrast  PORTABLE ABDOMEN - 1 VIEW  Comparison: 03/20/2013 CT  Findings: The left kidney is incompletely visualized.  Contrast has passed distally into the bladder, with partial visualization of the distal ureters bilaterally.  Persistent right nephrogram is re- identified.  Contrast within  nondilated bowel is re-identified.  IMPRESSION: Some interval passage of contrast into the bladder is noted with partial visualization of the distal ureters bilaterally, but with persistent right nephrogram.  Previous differential considerations are as per prior report.   Original Report Authenticated By: Christiana Pellant, M.D.    Scheduled Meds: . insulin aspart  0-9 Units Subcutaneous Q6H  . insulin glargine  5 Units Subcutaneous QHS  . metoprolol  50 mg Oral BID  . pantoprazole (PROTONIX) IV  40 mg Intravenous Q12H  . sodium chloride  3 mL Intravenous Q12H    Continuous Infusions: . sodium chloride 125 mL/hr at 03/21/13 0855    Principal Problem:   Right upper quadrant abdominal pain Active Problems:   HEPATITIS C, CHRONIC   DM   GERD   CIRRHOSIS   LIVER FUNCTION TESTS, ABNORMAL, HX OF   Gallstone   Other pancytopenia   Biliary colic    Time spent: 30 minutes    [facility] M  [facility] Pager [phone]. If 7PM-7AM, please contact night-coverage at [email or web address], password [facility] 03/21/2013, 11:38 AM  LOS: 1 day

## 2013-03-22 ENCOUNTER — Inpatient Hospital Stay (HOSPITAL_COMMUNITY): Payer: Medicaid Other

## 2013-03-22 DIAGNOSIS — R109 Unspecified abdominal pain: Secondary | ICD-10-CM

## 2013-03-22 LAB — BASIC METABOLIC PANEL
BUN: 27 mg/dL — ABNORMAL HIGH (ref 6–23)
CO2: 27 mEq/L (ref 19–32)
Calcium: 8.8 mg/dL (ref 8.4–10.5)
Creatinine, Ser: 0.96 mg/dL (ref 0.50–1.35)
GFR calc non Af Amer: >90 mL/min (ref >90)
Glucose, Bld: 127 mg/dL — ABNORMAL HIGH (ref 70–99)

## 2013-03-22 LAB — URINE CULTURE

## 2013-03-22 LAB — CBC
MCH: 32.7 pg (ref 26.0–34.0)
MCHC: 33.2 g/dL (ref 30.0–36.0)
MCV: 98.4 fL (ref 78.0–100.0)
Platelets: 70 10*3/uL — ABNORMAL LOW (ref 150–400)
RDW: 14.8 % (ref 11.5–15.5)

## 2013-03-22 LAB — GLUCOSE, CAPILLARY
Glucose-Capillary: 169 mg/dL — ABNORMAL HIGH (ref 70–99)
Glucose-Capillary: 154 mg/dL — ABNORMAL HIGH (ref 70–99)
Glucose-Capillary: 113 mg/dL — ABNORMAL HIGH (ref 70–99)
Glucose-Capillary: 124 mg/dL — ABNORMAL HIGH (ref 70–99)

## 2013-03-22 MED ORDER — CYCLOBENZAPRINE HCL 10 MG PO TABS
10.0000 mg | ORAL_TABLET | Freq: Three times a day (TID) | ORAL | Status: DC
  Administered 2013-03-22 – 2013-03-23 (×3): 10 mg via ORAL
  Filled 2013-03-22 (×3): qty 1

## 2013-03-22 MED ORDER — OXYCODONE HCL 5 MG PO TABS
5.0000 mg | ORAL_TABLET | ORAL | Status: DC | PRN
  Administered 2013-03-22 (×2): 5 mg via ORAL
  Administered 2013-03-23 (×2): 10 mg via ORAL
  Filled 2013-03-22: qty 1
  Filled 2013-03-22 (×2): qty 2
  Filled 2013-03-22: qty 1

## 2013-03-22 MED ORDER — OXYCODONE HCL 5 MG PO TABS
7.5000 mg | ORAL_TABLET | ORAL | Status: DC | PRN
  Administered 2013-03-22: 7.5 mg via ORAL
  Filled 2013-03-22: qty 2

## 2013-03-22 MED ORDER — PANTOPRAZOLE SODIUM 40 MG PO TBEC
40.0000 mg | DELAYED_RELEASE_TABLET | Freq: Two times a day (BID) | ORAL | Status: DC
  Administered 2013-03-22 – 2013-03-23 (×2): 40 mg via ORAL
  Filled 2013-03-22 (×2): qty 1

## 2013-03-22 NOTE — Care Management Note (Signed)
    Page 1 of 1   03/23/2013     10:47:17 AM   CARE MANAGEMENT NOTE 03/23/2013  Patient:  DEMARI, GALES   Account Number:  [id number]  Date Initiated:  03/22/2013  Documentation initiated by:  Rosemary Holms  Subjective/Objective Assessment:   Pt admitted from home where he lives with wife. Brother also assists. Goes to the [facility] and sees "[place]". Also receives his meds for free. DC home when stable     Action/Plan:   Anticipated DC Date:  03/24/2013   Anticipated DC Plan:  HOME/SELF CARE      DC Planning Services  CM consult      Choice offered to / List presented to:             Status of service:  Completed, signed off Medicare Important Message given?   (If response is "NO", the following Medicare IM given date fields will be blank) Date Medicare IM given:   Date Additional Medicare IM given:    Discharge Disposition:  HOME/SELF CARE  Per UR Regulation:    If discussed at Long Length of Stay Meetings, dates discussed:    Comments:  03/22/13 Rosemary Holms RN BNS CM

## 2013-03-22 NOTE — Progress Notes (Signed)
[facility] PROGRESS NOTE  [name] ZOX:[id number] DOB: 1960/08/17 DOA: 03/20/2013 PCP: [name], PA-C  Assessment/Plan: 1.Right upper quadrant, lower outer abdominal pain: Etiology unclear. Appears improved but pt reports little improvement. Abdominal US neg for acute findings. Gallbladder unremarkable as are kidneys. Tolerating diet without problem. Will transition pain med to po.  Pt remains afebrile with no leukocytosis. Liver disease makes interpretation of hepatic function panel difficult. No evidence of kidney stone.  2. Possible biliary colic,history of gallstones: Evaluated by Dr. Lovell Sheehan with general surgery. He opined no evidence of acute cholecystitis or need for acute surgical intervention at this time. 3.Elevated liver function tests, mixed pattern: Chronic elevation in transaminases and alkaline phosphatase noted. Bilirubin level higher than previous studies. Pt states he has been told "my liver is in end stages".  4. Abnormal CT of the abdomen and pelvis: Delayed nephrogram. Discussed with the interpreting radiologist Dr. Ova Freshwater, etiology unclear, differential is as reported. Clearly the patient is not in shock and he has normal renal function. KUB yields some interval passage of contrast into the bladder is noted with  partial visualization of the distal ureters bilaterally, but with persistent right nephrogram. Renal function stable. Appreciate nephrology input. Repeat urinalysis with large Hgb and 4.0 urobilinogen. Urology consult pending.   5. Cirrhosis: Ammonia is elevated at 107 on admission. Mentation remains normal, does not appear to be acutely decompensated.  6. Diabetes mellitus:Fair control. Continue sliding scale insulin and. Lantus. Hold metformin and glipizide while hospitalized.  7. COPD: Remains stable at baseline.  8. GERD: Poorly controlled. Continue PPI.  9.Chronic thrombocytopenia: Appears to be at baseline. Secondary to liver  disease  Code Status: full Family Communication: none at bedside Disposition Plan: home when ready   Consultants:  General surgery  Nephrology  urology  Procedures:  none  Antibiotics:  none  HPI/Subjective: Sitting up in bed eating breakfast. Reports continued RUQ pain  Objective: Filed Vitals:   03/22/13 0806  BP:   Pulse: 66  Temp:   Resp:     Intake/Output Summary (Last 24 hours) at 03/22/13 0836 Last data filed at 03/22/13 0534  Gross per 24 hour  Intake    483 ml  Output      0 ml  Net    483 ml   Filed Weights   03/20/13 1845 03/21/13 0407 03/21/13 0924  Weight: 284 lb 13.4 oz (129.2 kg) 289 lb 11.2 oz (131.407 kg) 289 lb 11 oz (131.4 kg)    Exam:   General:  Obese NAD eating well  Cardiovascular: RRR No MGR no LE edeam  Respiratory: normal effort BS clear to auscultation bilaterally no wheeze  Abdomen: obese soft +BS tender RUQ to palpation otherwise non tender.   Musculoskeletal: MAE no clubbing no cyanosis.    Data Reviewed: Basic Metabolic Panel:  Recent Labs Lab 03/20/13 1356 03/21/13 0616 03/22/13 0606  NA 132* 135 134*  K 4.6 4.7 4.4  CL 101 107 103  CO2 22 22 27   GLUCOSE 251* 157* 127*  BUN 15 26* 27*  CREATININE 1.02 1.06 0.96  CALCIUM 8.9 7.8* 8.8   Liver Function Tests:  Recent Labs Lab 03/20/13 1356 03/21/13 0616  AST 301* 176*  ALT 161* 118*  ALKPHOS 131* 93  BILITOT 2.8* 2.8*  PROT 7.5 6.4  ALBUMIN 3.2* 2.8*    Recent Labs Lab 03/20/13 1356  LIPASE 76*    Recent Labs Lab 03/20/13 1423  AMMONIA 107*   CBC:  Recent Labs  Lab 03/20/13 1356 03/21/13 0616 03/22/13 0606  WBC 8.0 6.7 8.0  NEUTROABS 6.9  --   --   HGB 13.5 11.3* 12.2*  HCT 40.5 33.3* 36.7*  MCV 96.7 96.2 98.4  PLT 63* 46* 70*   Cardiac Enzymes: No results found for this basename: CKTOTAL, CKMB, CKMBINDEX, TROPONINI,  in the last 168 hours BNP (last 3 results) No results found for this basename: PROBNP,  in the last 8760  hours CBG:  Recent Labs Lab 03/21/13 1217 03/21/13 1740 03/21/13 2114 03/22/13 0206 03/22/13 0549  GLUCAP 113* 141* 206* 113* 102*    No results found for this or any previous visit (from the past 240 hour(s)).   Studies: Dg Chest 2 View  03/20/2013   *RADIOLOGY REPORT*  Clinical Data: Weakness and cough  CHEST - 2 VIEW  Comparison: 04/08/2004  Findings: Lungs are under aerated and clear.  Normal heart size. No pneumothorax.  No pleural effusion.  IMPRESSION: No active cardiopulmonary disease.   Original Report Authenticated By: Jolaine Click, M.D.   Dg Abd 1 View  03/22/2013   *RADIOLOGY REPORT*  Clinical Data: Follow up of abdominal pain.  Persistent nephrograms.  ABDOMEN - 1 VIEW  Comparison: Abdominal ultrasound 03/21/2013.  Plain film 03/20/2013.  Findings: 2 supine views.  Contrast identified throughout the colon, normal in caliber.  No small bowel dilatation.  Resolution of previous described right-sided persistent nephrogram.  No residual contrast in the ureters or urinary bladder.  IMPRESSION: Resolution of persistent right nephrogram.  No acute findings.   Original Report Authenticated By: Jeronimo Greaves, M.D.   US Abdomen Complete  03/21/2013   *RADIOLOGY REPORT*  Clinical Data:  Right upper quadrant abdominal pain.  History of cirrhosis/hepatitis C with diabetes, hypertension and cholelithiasis.  COMPLETE ABDOMINAL ULTRASOUND  Comparison:  Abdominal ultrasound 10/03/2011.  Abdominal CT 03/20/2013.  Findings:  Study is mildly degraded by bowel gas and body habitus.  Gallbladder: Previously demonstrated gallstone is not clearly seen today.  There is a small echogenic nonshadowing focus along the gallbladder which may reflect a small polyp.  There is no gallbladder wall thickening or sonographic Murphy's sign.  Common bile duct:   Normal in caliber without filling defects.  Liver:  The hepatic contours are diffusely irregular consistent with cirrhosis.  The liver is enlarged with  heterogeneous echotexture.  No focal lesions are identified.  IVC:  Visualized portions appear unremarkable.  Pancreas:  Poorly visualized due to overlying bowel gas.  Spleen:  The spleen is enlarged, measuring 23.3 x 13.2 x 24.4 cm for an estimated volume of 3916 ml.  No focal lesions are identified.  Right Kidney:   The renal cortical thickness and echogenicity are preserved.  There is no hydronephrosis or focal abnormality. Renal length is 12.5 cm.  Left Kidney:   The renal cortical thickness and echogenicity are preserved.  There is no hydronephrosis or focal abnormality. Renal length is 13.7 cm.  Abdominal aorta:  The visualized portions appear unremarkable.  The distal aorta is obscured by bowel gas.  IMPRESSION:  1.  Grossly stable changes of cirrhosis with portal hypertension. There is moderate splenomegaly. 2.  Small gallbladder polyp.  No gallstones or gallbladder wall thickening demonstrated. 3.  No acute findings seen.   Original Report Authenticated By: Carey Bullocks, M.D.   Ct Abdomen Pelvis W Contrast  03/20/2013   *RADIOLOGY REPORT*  Clinical Data: The abdominal pain.  Nausea vomiting.  Diabetes. Cirrhosis.  Hepatitis C.  CT ABDOMEN AND PELVIS WITH CONTRAST  Technique:  Multidetector CT imaging of the abdomen and pelvis was performed following the standard protocol during bolus administration of intravenous contrast.  Contrast: 50mL OMNIPAQUE IOHEXOL 300 MG/ML  SOLN, OMNIPAQUE IOHEXOL 300 MG/ML  SOLN  Comparison: 10/13/2012  Findings: Stable mildly prominent epicardial lymph nodes.  Trace perihepatic ascites.  Lobulated liver contour compatible with cirrhosis.  Prominent but stable splenomegaly noted with considerable varices indicating portal venous hypertension.  No portal vein thrombosis observed.  Small dependent gallstones in the gallbladder.  Adrenal glands and pancreas appear unremarkable.  Portacaval node short axis 1.6 cm, formerly the same.  Scattered small mesenteric and  retroperitoneal lymph nodes, similar to prior.  Mild stranding at the root the mesentery, unchanged.  There is trace fluid tracking in the right paracolic gutter. Appendix normal where visualized.  Orally administered contrast is only in the duodenum and proximal jejunum at the time of imaging.  Small hypodense lesions in the right kidney are likely cyst but technically nonspecific.  There is a 3 x 1 mm left kidney lower pole nonobstructive calculus and a 1 mm left mid kidney nonobstructive calculus.  No hydronephrosis or hydroureter. Urinary bladder unremarkable.  Small amount of pelvic ascites. Left later renal vein collateral to the left gonadal vein observed.  There is no appreciable excretion of contrast medium from the kidneys on the delayed phase images.  IMPRESSION:  1.  Abnormal bilateral delayed nephrogram, with lack of excretion of contrast into the collecting systems 4 minutes out from contrast injection.  Differential diagnostic considerations include acute tubular necrosis and acute glomerulonephritis.  I telephoned the radiology department and the technologist noted that the patient was responding and interacting normally after the scan, making hypotensive shock as a cause for the delayed nephrogram unlikely. 2.  Nonobstructive left nephrolithiasis. 3.  Cirrhosis with stable splenomegaly and collaterals.  Mild ascites potentially from hypoalbuminemia.  4.  borderline prominent lymph nodes, similar to prior and nonspecific. 5.  Cholelithiasis.   Original Report Authenticated By: Gaylyn Rong, M.D.   Dg Abd Portable 1v  03/20/2013   *RADIOLOGY REPORT*  Clinical Data: Delayed nephrogram, assess passage of contrast  PORTABLE ABDOMEN - 1 VIEW  Comparison: 03/20/2013 CT  Findings: The left kidney is incompletely visualized.  Contrast has passed distally into the bladder, with partial visualization of the distal ureters bilaterally.  Persistent right nephrogram is re- identified.  Contrast within  nondilated bowel is re-identified.  IMPRESSION: Some interval passage of contrast into the bladder is noted with partial visualization of the distal ureters bilaterally, but with persistent right nephrogram.  Previous differential considerations are as per prior report.   Original Report Authenticated By: Christiana Pellant, M.D.    Scheduled Meds: . insulin aspart  0-9 Units Subcutaneous Q6H  . insulin glargine  5 Units Subcutaneous QHS  . metoprolol  50 mg Oral BID  . pantoprazole (PROTONIX) IV  40 mg Intravenous Q12H  . sodium chloride  3 mL Intravenous Q12H   Continuous Infusions:   Principal Problem:   Right upper quadrant abdominal pain Active Problems:   HEPATITIS C, CHRONIC   DM   GERD   CIRRHOSIS   LIVER FUNCTION TESTS, ABNORMAL, HX OF   Gallstone   Other pancytopenia   Biliary colic    Time spent: 30 minutes    [facility]  [facility] Pager [phone]. If 7PM-7AM, please contact night-coverage at [email or web address], password [facility] 03/22/2013, 8:36 AM  LOS: 2 days   Attending note:    Patient seen  and examined. Above note reviewed.  The patient has been admitted with acute on chronic abdominal pain. He reports having upper back pain in the right sub-scapula region that radiates down to his right lower quadrant and up to his right upper quadrant. He reports his been present for the last 4 months and worse so more acutely. The patient has had extensive imaging since being in the hospital. CT scan done of the abdomen and pelvis was done on admission. He's also had followup abdominal ultrasound. It does not appear that he has any cholelithiasis at this time. He was seen by general surgery did not feel that patient requires cholecystectomy at this time. He was also mention of delayed nephrogram on initial CT of the abdomen and pelvis. Patient was seen by nephrology and his renal function has been stable. It was felt that this may be related to hypovolemia/dehydration. Urology  was also consulted. Dr. Jerre Simon saw the patient and recommended repeat CT of the abdomen and pelvis to rule out any underlying pathology. I further discussed the case with Dr.Veazey with radiology. It was recommended that since patient is clinically stable, renal function is stable, and that contrast dye has passed into the bladder with a followup KUB showing resolution of abnormal right-sided nephrogram, that repeat CT scan with contrast would not significantly add any benefit to the patient's care and would expose him to further risks of contrast including worsening renal function. It was felt that if his symptoms continue to persist down the road or there is a change in clinical status, a repeat CT may be conducted at that time. Will try to contact Dr. Jesse Fall to discuss this further, but have held off on repeat CT for now. On examination, his pain appears to be worse with palpation over the right upper back as well as the abdomen. Etiology is not entirely clear. We'll try the patient on Flexeril for any component of muscle strain which could be causing this back pain. We'll continue to monitor for another 24 hours.   Olimpia Tinch

## 2013-03-22 NOTE — Progress Notes (Signed)
Subjective: Still with right-sided abdominal pain.  Objective: Vital signs in last 24 hours: Temp:  [97 F (36.1 C)-97.9 F (36.6 C)] 97.9 F (36.6 C) (08/12 0534) Pulse Rate:  [58-69] 66 (08/12 0806) Resp:  [20] 20 (08/12 0534) BP: (117-140)/(64-69) 140/69 mmHg (08/12 0534) SpO2:  [96 %-100 %] 96 % (08/12 0806) Last BM Date: 03/20/13  Intake/Output from previous day: 08/11 0701 - 08/12 0700 In: 483 [P.O.:480; I.V.:3] Out: -  Intake/Output this shift:    General appearance: alert, cooperative and no distress GI: Soft. Mild tenderness to deep palpation right upper quadrant. No rigidity noted.  Lab Results:   Recent Labs  03/21/13 0616 03/22/13 0606  WBC 6.7 8.0  HGB 11.3* 12.2*  HCT 33.3* 36.7*  PLT 46* 70*   BMET  Recent Labs  03/21/13 0616 03/22/13 0606  NA 135 134*  K 4.7 4.4  CL 107 103  CO2 22 27  GLUCOSE 157* 127*  BUN 26* 27*  CREATININE 1.06 0.96  CALCIUM 7.8* 8.8   PT/INR  Recent Labs  03/20/13 1356  LABPROT 14.6  INR 1.16    Studies/Results: Dg Chest 2 View  03/20/2013   *RADIOLOGY REPORT*  Clinical Data: Weakness and cough  CHEST - 2 VIEW  Comparison: 04/08/2004  Findings: Lungs are under aerated and clear.  Normal heart size. No pneumothorax.  No pleural effusion.  IMPRESSION: No active cardiopulmonary disease.   Original Report Authenticated By: Jolaine Click, M.D.   Dg Abd 1 View  03/22/2013   *RADIOLOGY REPORT*  Clinical Data: Follow up of abdominal pain.  Persistent nephrograms.  ABDOMEN - 1 VIEW  Comparison: Abdominal ultrasound 03/21/2013.  Plain film 03/20/2013.  Findings: 2 supine views.  Contrast identified throughout the colon, normal in caliber.  No small bowel dilatation.  Resolution of previous described right-sided persistent nephrogram.  No residual contrast in the ureters or urinary bladder.  IMPRESSION: Resolution of persistent right nephrogram.  No acute findings.   Original Report Authenticated By: Jeronimo Greaves, M.D.    US Abdomen Complete  03/21/2013   *RADIOLOGY REPORT*  Clinical Data:  Right upper quadrant abdominal pain.  History of cirrhosis/hepatitis C with diabetes, hypertension and cholelithiasis.  COMPLETE ABDOMINAL ULTRASOUND  Comparison:  Abdominal ultrasound 10/03/2011.  Abdominal CT 03/20/2013.  Findings:  Study is mildly degraded by bowel gas and body habitus.  Gallbladder: Previously demonstrated gallstone is not clearly seen today.  There is a small echogenic nonshadowing focus along the gallbladder which may reflect a small polyp.  There is no gallbladder wall thickening or sonographic Murphy's sign.  Common bile duct:   Normal in caliber without filling defects.  Liver:  The hepatic contours are diffusely irregular consistent with cirrhosis.  The liver is enlarged with heterogeneous echotexture.  No focal lesions are identified.  IVC:  Visualized portions appear unremarkable.  Pancreas:  Poorly visualized due to overlying bowel gas.  Spleen:  The spleen is enlarged, measuring 23.3 x 13.2 x 24.4 cm for an estimated volume of 3916 ml.  No focal lesions are identified.  Right Kidney:   The renal cortical thickness and echogenicity are preserved.  There is no hydronephrosis or focal abnormality. Renal length is 12.5 cm.  Left Kidney:   The renal cortical thickness and echogenicity are preserved.  There is no hydronephrosis or focal abnormality. Renal length is 13.7 cm.  Abdominal aorta:  The visualized portions appear unremarkable.  The distal aorta is obscured by bowel gas.  IMPRESSION:  1.  Grossly stable  changes of cirrhosis with portal hypertension. There is moderate splenomegaly. 2.  Small gallbladder polyp.  No gallstones or gallbladder wall thickening demonstrated. 3.  No acute findings seen.   Original Report Authenticated By: Carey Bullocks, M.D.   Ct Abdomen Pelvis W Contrast  03/20/2013   *RADIOLOGY REPORT*  Clinical Data: The abdominal pain.  Nausea vomiting.  Diabetes. Cirrhosis.  Hepatitis C.  CT  ABDOMEN AND PELVIS WITH CONTRAST  Technique:  Multidetector CT imaging of the abdomen and pelvis was performed following the standard protocol during bolus administration of intravenous contrast.  Contrast: 50mL OMNIPAQUE IOHEXOL 300 MG/ML  SOLN, OMNIPAQUE IOHEXOL 300 MG/ML  SOLN  Comparison: 10/13/2012  Findings: Stable mildly prominent epicardial lymph nodes.  Trace perihepatic ascites.  Lobulated liver contour compatible with cirrhosis.  Prominent but stable splenomegaly noted with considerable varices indicating portal venous hypertension.  No portal vein thrombosis observed.  Small dependent gallstones in the gallbladder.  Adrenal glands and pancreas appear unremarkable.  Portacaval node short axis 1.6 cm, formerly the same.  Scattered small mesenteric and retroperitoneal lymph nodes, similar to prior.  Mild stranding at the root the mesentery, unchanged.  There is trace fluid tracking in the right paracolic gutter. Appendix normal where visualized.  Orally administered contrast is only in the duodenum and proximal jejunum at the time of imaging.  Small hypodense lesions in the right kidney are likely cyst but technically nonspecific.  There is a 3 x 1 mm left kidney lower pole nonobstructive calculus and a 1 mm left mid kidney nonobstructive calculus.  No hydronephrosis or hydroureter. Urinary bladder unremarkable.  Small amount of pelvic ascites. Left later renal vein collateral to the left gonadal vein observed.  There is no appreciable excretion of contrast medium from the kidneys on the delayed phase images.  IMPRESSION:  1.  Abnormal bilateral delayed nephrogram, with lack of excretion of contrast into the collecting systems 4 minutes out from contrast injection.  Differential diagnostic considerations include acute tubular necrosis and acute glomerulonephritis.  I telephoned the radiology department and the technologist noted that the patient was responding and interacting normally after the scan,  making hypotensive shock as a cause for the delayed nephrogram unlikely. 2.  Nonobstructive left nephrolithiasis. 3.  Cirrhosis with stable splenomegaly and collaterals.  Mild ascites potentially from hypoalbuminemia.  4.  borderline prominent lymph nodes, similar to prior and nonspecific. 5.  Cholelithiasis.   Original Report Authenticated By: Gaylyn Rong, M.D.   Dg Abd Portable 1v  03/20/2013   *RADIOLOGY REPORT*  Clinical Data: Delayed nephrogram, assess passage of contrast  PORTABLE ABDOMEN - 1 VIEW  Comparison: 03/20/2013 CT  Findings: The left kidney is incompletely visualized.  Contrast has passed distally into the bladder, with partial visualization of the distal ureters bilaterally.  Persistent right nephrogram is re- identified.  Contrast within nondilated bowel is re-identified.  IMPRESSION: Some interval passage of contrast into the bladder is noted with partial visualization of the distal ureters bilaterally, but with persistent right nephrogram.  Previous differential considerations are as per prior report.   Original Report Authenticated By: Christiana Pellant, M.D.    Anti-infectives: Anti-infectives   None      Assessment/Plan: Impression: Abdominal pain of unknown etiology. Ultrasound of the gallbladder yesterday shows no cholelithiasis or thickened gallbladder wall. He does not have ultrasound evidence of cholecystitis. No need for cholecystectomy at this point. Abdominal pain may be secondary to either right kidney issues or cirrhosis. Will sign off. Please call me if I  can be of further assistance.  LOS: 2 days    Candra Wegner A 03/22/2013

## 2013-03-22 NOTE — Progress Notes (Signed)
Subjective: Interval History: has no complaint of nausea or vomiting. Presently his abdominal pain has improved. He denies any difficulty in breathing..  Objective: Vital signs in last 24 hours: Temp:  [97 F (36.1 C)-97.9 F (36.6 C)] 97.9 F (36.6 C) (08/12 0534) Pulse Rate:  [58-69] 64 (08/12 0534) Resp:  [20] 20 (08/12 0534) BP: (117-140)/(64-69) 140/69 mmHg (08/12 0534) SpO2:  [96 %-100 %] 96 % (08/12 0534) Weight:  [131.4 kg (289 lb 11 oz)] 131.4 kg (289 lb 11 oz) (08/11 0924) Weight change: -0.143 kg (-5.1 oz)  Intake/Output from previous day: 08/11 0701 - 08/12 0700 In: 483 [P.O.:480; I.V.:3] Out: -  Intake/Output this shift:    General appearance: alert, cooperative and no distress Resp: clear to auscultation bilaterally Cardio: regular rate and rhythm, S1, S2 normal, no murmur, click, rub or gallop GI: soft, non-tender; bowel sounds normal; no masses,  no organomegaly Extremities: extremities normal, atraumatic, no cyanosis or edema  Lab Results:  Recent Labs  03/21/13 0616 03/22/13 0606  WBC 6.7 8.0  HGB 11.3* 12.2*  HCT 33.3* 36.7*  PLT 46* 70*   BMET:  Recent Labs  03/21/13 0616 03/22/13 0606  NA 135 134*  K 4.7 4.4  CL 107 103  CO2 22 27  GLUCOSE 157* 127*  BUN 26* 27*  CREATININE 1.06 0.96  CALCIUM 7.8* 8.8   No results found for this basename: PTH,  in the last 72 hours Iron Studies: No results found for this basename: IRON, TIBC, TRANSFERRIN, FERRITIN,  in the last 72 hours  Studies/Results: Dg Chest 2 View  03/20/2013   *RADIOLOGY REPORT*  Clinical Data: Weakness and cough  CHEST - 2 VIEW  Comparison: 04/08/2004  Findings: Lungs are under aerated and clear.  Normal heart size. No pneumothorax.  No pleural effusion.  IMPRESSION: No active cardiopulmonary disease.   Original Report Authenticated By: Jolaine Click, M.D.   US Abdomen Complete  03/21/2013   *RADIOLOGY REPORT*  Clinical Data:  Right upper quadrant abdominal pain.  History of  cirrhosis/hepatitis C with diabetes, hypertension and cholelithiasis.  COMPLETE ABDOMINAL ULTRASOUND  Comparison:  Abdominal ultrasound 10/03/2011.  Abdominal CT 03/20/2013.  Findings:  Study is mildly degraded by bowel gas and body habitus.  Gallbladder: Previously demonstrated gallstone is not clearly seen today.  There is a small echogenic nonshadowing focus along the gallbladder which may reflect a small polyp.  There is no gallbladder wall thickening or sonographic Murphy's sign.  Common bile duct:   Normal in caliber without filling defects.  Liver:  The hepatic contours are diffusely irregular consistent with cirrhosis.  The liver is enlarged with heterogeneous echotexture.  No focal lesions are identified.  IVC:  Visualized portions appear unremarkable.  Pancreas:  Poorly visualized due to overlying bowel gas.  Spleen:  The spleen is enlarged, measuring 23.3 x 13.2 x 24.4 cm for an estimated volume of 3916 ml.  No focal lesions are identified.  Right Kidney:   The renal cortical thickness and echogenicity are preserved.  There is no hydronephrosis or focal abnormality. Renal length is 12.5 cm.  Left Kidney:   The renal cortical thickness and echogenicity are preserved.  There is no hydronephrosis or focal abnormality. Renal length is 13.7 cm.  Abdominal aorta:  The visualized portions appear unremarkable.  The distal aorta is obscured by bowel gas.  IMPRESSION:  1.  Grossly stable changes of cirrhosis with portal hypertension. There is moderate splenomegaly. 2.  Small gallbladder polyp.  No gallstones or gallbladder  wall thickening demonstrated. 3.  No acute findings seen.   Original Report Authenticated By: Carey Bullocks, M.D.   Ct Abdomen Pelvis W Contrast  03/20/2013   *RADIOLOGY REPORT*  Clinical Data: The abdominal pain.  Nausea vomiting.  Diabetes. Cirrhosis.  Hepatitis C.  CT ABDOMEN AND PELVIS WITH CONTRAST  Technique:  Multidetector CT imaging of the abdomen and pelvis was performed following  the standard protocol during bolus administration of intravenous contrast.  Contrast: 50mL OMNIPAQUE IOHEXOL 300 MG/ML  SOLN, OMNIPAQUE IOHEXOL 300 MG/ML  SOLN  Comparison: 10/13/2012  Findings: Stable mildly prominent epicardial lymph nodes.  Trace perihepatic ascites.  Lobulated liver contour compatible with cirrhosis.  Prominent but stable splenomegaly noted with considerable varices indicating portal venous hypertension.  No portal vein thrombosis observed.  Small dependent gallstones in the gallbladder.  Adrenal glands and pancreas appear unremarkable.  Portacaval node short axis 1.6 cm, formerly the same.  Scattered small mesenteric and retroperitoneal lymph nodes, similar to prior.  Mild stranding at the root the mesentery, unchanged.  There is trace fluid tracking in the right paracolic gutter. Appendix normal where visualized.  Orally administered contrast is only in the duodenum and proximal jejunum at the time of imaging.  Small hypodense lesions in the right kidney are likely cyst but technically nonspecific.  There is a 3 x 1 mm left kidney lower pole nonobstructive calculus and a 1 mm left mid kidney nonobstructive calculus.  No hydronephrosis or hydroureter. Urinary bladder unremarkable.  Small amount of pelvic ascites. Left later renal vein collateral to the left gonadal vein observed.  There is no appreciable excretion of contrast medium from the kidneys on the delayed phase images.  IMPRESSION:  1.  Abnormal bilateral delayed nephrogram, with lack of excretion of contrast into the collecting systems 4 minutes out from contrast injection.  Differential diagnostic considerations include acute tubular necrosis and acute glomerulonephritis.  I telephoned the radiology department and the technologist noted that the patient was responding and interacting normally after the scan, making hypotensive shock as a cause for the delayed nephrogram unlikely. 2.  Nonobstructive left nephrolithiasis. 3.   Cirrhosis with stable splenomegaly and collaterals.  Mild ascites potentially from hypoalbuminemia.  4.  borderline prominent lymph nodes, similar to prior and nonspecific. 5.  Cholelithiasis.   Original Report Authenticated By: Gaylyn Rong, M.D.   Dg Abd Portable 1v  03/20/2013   *RADIOLOGY REPORT*  Clinical Data: Delayed nephrogram, assess passage of contrast  PORTABLE ABDOMEN - 1 VIEW  Comparison: 03/20/2013 CT  Findings: The left kidney is incompletely visualized.  Contrast has passed distally into the bladder, with partial visualization of the distal ureters bilaterally.  Persistent right nephrogram is re- identified.  Contrast within nondilated bowel is re-identified.  IMPRESSION: Some interval passage of contrast into the bladder is noted with partial visualization of the distal ureters bilaterally, but with persistent right nephrogram.  Previous differential considerations are as per prior report.   Original Report Authenticated By: Christiana Pellant, M.D.    I have reviewed the patient's current medications.  Assessment/Plan: Problem #1 renal failure he's BUN and creatinine has returned to his baseline. Presently patient is none oliguric. Problem #2 diabetes Problem #3 history of liver cirrhosis Problem #4 hepatitis C infection Problem #5 iron deficiency anemia  Problem #6 hypertension his blood pressure seems reasonably controlled. Problem #7 abdominal pain was likely from his liver presently seems to be improving. Patient also has gallstone. Problem #8 GERD Plan: We'll DC IV fluid as his  renal function seems to be stable A follow patient as an outpatient in 4 weeks.   LOS: 2 days   Nona Gracey S 03/22/2013,7:20 AM

## 2013-03-22 NOTE — Consult Note (Signed)
Report [phone]

## 2013-03-22 NOTE — Progress Notes (Signed)
The patient is receiving Protonix by the intravenous route.  Based on criteria approved by the Pharmacy and Therapeutics Committee and the Medical Executive Committee, the medication is being converted to the equivalent oral dose form. (Pt was on Nexium PO PTA.  Protonix is formulary substitution)  These criteria include: -No Active GI bleeding -Able to tolerate diet of full liquids (or better) or tube feeding OR able to tolerate other medications by the oral or enteral route  If you have any questions about this conversion, please contact the Pharmacy Department (ext [phone]).  Thank you.  Wayland Denis, [facility] 03/22/2013 2:41 PM

## 2013-03-23 DIAGNOSIS — K219 Gastro-esophageal reflux disease without esophagitis: Secondary | ICD-10-CM

## 2013-03-23 LAB — COMPREHENSIVE METABOLIC PANEL
ALT: 94 U/L — ABNORMAL HIGH (ref 0–53)
BUN: 18 mg/dL (ref 6–23)
CO2: 26 mEq/L (ref 19–32)
Calcium: 8.2 mg/dL — ABNORMAL LOW (ref 8.4–10.5)
GFR calc Af Amer: >90 mL/min (ref >90)
GFR calc non Af Amer: >90 mL/min (ref >90)
Glucose, Bld: 105 mg/dL — ABNORMAL HIGH (ref 70–99)
Sodium: 137 mEq/L (ref 135–145)

## 2013-03-23 MED ORDER — OXYCODONE HCL 5 MG PO TABS: 5.0000 mg | ORAL_TABLET | ORAL | Status: DC | PRN

## 2013-03-23 MED ORDER — CYCLOBENZAPRINE HCL 10 MG PO TABS: 10.0000 mg | ORAL_TABLET | Freq: Three times a day (TID) | ORAL | Status: DC

## 2013-03-23 NOTE — Discharge Summary (Signed)
Physician Discharge Summary  Usbaldo Pannone Manfredi YQM:[id number] DOB: 1960/11/28 DOA: 03/20/2013  PCP: [name], PA-C  Admit date: 03/20/2013 Discharge date: 03/23/2013  Time spent: 40 minutes  Recommendations for Outpatient Follow-up:  1. Dr. Kristian Covey in 4 weeks 2. PCP at [facility] in 1 week for evaluation of symptoms 3. Dr. Darrick Penna in 1-2 weeks for evaluation of symptoms 4. Dr. Jerre Simon in 2 weeks  Discharge Diagnoses:  Principal Problem:   Right upper quadrant abdominal pain Active Problems:   HEPATITIS C, CHRONIC   DM   GERD   CIRRHOSIS   LIVER FUNCTION TESTS, ABNORMAL, HX OF   Gallstone   Other pancytopenia   Biliary colic   Discharge Condition: stable  Diet recommendation: carb modified  Filed Weights   03/20/13 1845 03/21/13 0407 03/21/13 0924  Weight: 284 lb 13.4 oz (129.2 kg) 289 lb 11.2 oz (131.407 kg) 289 lb 11 oz (131.4 kg)    History of present illness:  52 year old man with history of hepatitis C, cirrhosis presented to the emergency department on 03/20/13 with acute on chronic abdominal pain, predominantly right upper quadrant but some right lower quadrant. Initial evaluation in the emergency department was notable for stable vital signs, tender right upper and right lower quadrant. White blood cell count was normal, CMP revealed chronic elevation of AST, ALT, alkaline phosphatase. Bilirubin 2.8. Chest x-ray unremarkable. CT of the abdomen and pelvis revealed bilateral delayed nephrogram of unclear etiology. Because of ongoing abdominal pain and history, the patient was referred for admission.  Patient reported abdominal pain had been going on for 2-3 weeks, predominantly right mid abdomen as well as right flank radiating around to the right lower cautery. Aggravated by movement, no specific alleviating factors. He had a sharp component, intensity 8/10 until 03/20/13 when it was 10/10 he developed severe pain much worse than what he had been  experiencing for the previous few weeks. He localized this to the right midabdomen, somewhat in the upper quadrant as well as the right flank radiating around. He noted some chills at home and a feeling of fever, several episodes of vomiting (no blood) an episode of diarrhea. He was treated with Dilaudid, Zofran. He has a history of gallstones and had previously been referred for consideration of elective cholecystectomy which was deferred secondary to history of cirrhosis and lack of acute issue. He has been urinating without difficulty.      Hospital Course:  1.Right upper quadrant, lower outer abdominal pain: Etiology unclear. Pt admitted to floor. Abdominal US neg for acute findings. Gallbladder unremarkable as are kidneys. The patient has had extensive imaging in the hospital. CT scan done of the abdomen and pelvis was done on admission. He's also had followup abdominal ultrasound. It does not appear that he has any cholelithiasis at this time. He was seen by general surgery did not feel that patient requires cholecystectomy at this time. He was also mention of delayed nephrogram on initial CT of the abdomen and pelvis. Patient was seen by nephrology and his renal function has been stable. It was felt that this may be related to hypovolemia/dehydration. Urology was also consulted. Dr. Jerre Simon saw the patient and recommended repeat CT of the abdomen and pelvis to rule out any underlying pathology.Further discussion with Dr.Veazey with radiology. It was recommended that since patient is clinically stable, renal function is stable, and that contrast dye has passed into the bladder with a followup KUB showing resolution of abnormal right-sided nephrogram, that repeat CT  scan with contrast would not significantly add any benefit to the patient's care and would expose him to further risks of contrast including worsening renal function. It was felt that if his symptoms continue to persist down the road or there  is a change in clinical status, a repeat CT may be conducted. Flexeril started as some indication pain related to muscle spasm in past. On morning of discharge pt reports continued right "side" pain but reports having slept through night without pain. At discharge he is tolerating diet without problem. Pt remained afebrile with no leukocytosis. Liver disease makes interpretation of hepatic function panel difficult but liver function tests trending down and close to baseline at discharge. Will discharge with pain medicine and muscle relaxer. He will follow up with nephrology in 4 weeks and GI Dr. Darrick Penna in 1 week for evaluation of symptoms.    2. Possible biliary colic,history of gallstones: Evaluated by Dr. Lovell Sheehan with general surgery. He opined no evidence of acute cholecystitis or need for acute surgical intervention at this time.   3.Elevated liver function tests, mixed pattern: Chronic elevation in transaminases and alkaline phosphatase noted. Bilirubin level higher than previous studies on admission. Pt states he has been told "my liver is in end stages". At discharge levels trending down to what appears baseline. Recommend follow up with Dr. Darrick Penna 1 week.   4. Abnormal CT of the abdomen and pelvis: Delayed nephrogram. See #1. Follow up with nephrology in 4 weeks.    5. Cirrhosis: Ammonia is elevated at 107 on admission. Mentation remained normal during this hospitalization. Follow up with Dr. Darrick Penna 1 week.    6. Diabetes mellitus:Fair control. Continue sliding scale insulin and. Lantus. Held metformin and glipizide while hospitalized. At discharge tolerating diet without problem. Will discharge him on his home regimen.    7. COPD: Remains stable at baseline.   8. GERD: Poorly controlled. Continue PPI.   9.Chronic thrombocytopenia: Appears to be at baseline. Secondary to liver disease. Last seen by heme 02/18/13. Follow up for 4 months      Procedures:  none  Consultations: General  surgery  Nephrology  urology  Discharge Exam: Filed Vitals:   03/23/13 0842  BP:   Pulse: 66  Temp:   Resp:     General: Obese NAD Cardiovascular: RRR No MGR No LE edema Respiratory: normal effort somewhat shallow. BS clear bilaterally no wheeze no rhonchi Abdomen:obese soft +BS throughout but somewhat sluggish lower quadrants. Mild tenderness to upper right quadrant to placement of stethascope.   Discharge Instructions   Future Appointments Provider Department Dept Phone   03/30/2013 10:30 AM [place] Bali, MD [facility] [phone]   06/21/2013 9:00 AM [facility] [phone]   06/22/2013 1:30 PM Claudia Desanctis [facility] [phone]       Medication List         albuterol 108 (90 BASE) MCG/ACT inhaler  Commonly known as:  PROVENTIL HFA;VENTOLIN HFA  Inhale 2 puffs into the lungs every 6 (six) hours as needed. Shortness of breath     cyclobenzaprine 10 MG tablet  Commonly known as:  FLEXERIL  Take 1 tablet (10 mg total) by mouth 3 (three) times daily.     esomeprazole 40 MG capsule  Commonly known as:  NEXIUM  Take 1 capsule (40 mg total) by mouth 2 (two) times daily. One po once to twice daily before a meal.     glipiZIDE 10 MG tablet  Commonly known as:  GLUCOTROL  Take 10 mg by mouth 2 (two) times daily before a meal.     insulin glargine 100 UNIT/ML injection  Commonly known as:  LANTUS  Inject 10 Units into the skin at bedtime.     metFORMIN 1000 MG tablet  Commonly known as:  GLUCOPHAGE  Take 1,000 mg by mouth 2 (two) times daily with a meal.     metoprolol 50 MG tablet  Commonly known as:  LOPRESSOR  Take 50 mg by mouth 2 (two) times daily.     oxyCODONE 5 MG immediate release tablet  Commonly known as:  Oxy IR/ROXICODONE  Take 1-2 tablets (5-10 mg total) by mouth every 4 (four) hours as needed for pain.     quinapril 20 MG tablet  Commonly known as:  ACCUPRIL  Take  40 mg by mouth at bedtime.       No Known Allergies     Follow-up Information   Follow up with [facility], [facility] In 4 weeks.   Specialty:  Nephrology   Contact information:   [address] [place] [address] [phone]        The results of significant diagnostics from this hospitalization (including imaging, microbiology, ancillary and laboratory) are listed below for reference.    Significant Diagnostic Studies: Dg Chest 2 View  03/20/2013   *RADIOLOGY REPORT*  Clinical Data: Weakness and cough  CHEST - 2 VIEW  Comparison: 04/08/2004  Findings: Lungs are under aerated and clear.  Normal heart size. No pneumothorax.  No pleural effusion.  IMPRESSION: No active cardiopulmonary disease.   Original Report Authenticated By: Jolaine Click, M.D.   Dg Abd 1 View  03/22/2013   *RADIOLOGY REPORT*  Clinical Data: Follow up of abdominal pain.  Persistent nephrograms.  ABDOMEN - 1 VIEW  Comparison: Abdominal ultrasound 03/21/2013.  Plain film 03/20/2013.  Findings: 2 supine views.  Contrast identified throughout the colon, normal in caliber.  No small bowel dilatation.  Resolution of previous described right-sided persistent nephrogram.  No residual contrast in the ureters or urinary bladder.  IMPRESSION: Resolution of persistent right nephrogram.  No acute findings.   Original Report Authenticated By: Jeronimo Greaves, M.D.   US Abdomen Complete  03/21/2013   *RADIOLOGY REPORT*  Clinical Data:  Right upper quadrant abdominal pain.  History of cirrhosis/hepatitis C with diabetes, hypertension and cholelithiasis.  COMPLETE ABDOMINAL ULTRASOUND  Comparison:  Abdominal ultrasound 10/03/2011.  Abdominal CT 03/20/2013.  Findings:  Study is mildly degraded by bowel gas and body habitus.  Gallbladder: Previously demonstrated gallstone is not clearly seen today.  There is a small echogenic nonshadowing focus along the gallbladder which may reflect a small polyp.  There is no gallbladder wall  thickening or sonographic Murphy's sign.  Common bile duct:   Normal in caliber without filling defects.  Liver:  The hepatic contours are diffusely irregular consistent with cirrhosis.  The liver is enlarged with heterogeneous echotexture.  No focal lesions are identified.  IVC:  Visualized portions appear unremarkable.  Pancreas:  Poorly visualized due to overlying bowel gas.  Spleen:  The spleen is enlarged, measuring 23.3 x 13.2 x 24.4 cm for an estimated volume of 3916 ml.  No focal lesions are identified.  Right Kidney:   The renal cortical thickness and echogenicity are preserved.  There is no hydronephrosis or focal abnormality. Renal length is 12.5 cm.  Left Kidney:   The renal cortical thickness and echogenicity are preserved.  There is no hydronephrosis  or focal abnormality. Renal length is 13.7 cm.  Abdominal aorta:  The visualized portions appear unremarkable.  The distal aorta is obscured by bowel gas.  IMPRESSION:  1.  Grossly stable changes of cirrhosis with portal hypertension. There is moderate splenomegaly. 2.  Small gallbladder polyp.  No gallstones or gallbladder wall thickening demonstrated. 3.  No acute findings seen.   Original Report Authenticated By: Carey Bullocks, M.D.   Ct Abdomen Pelvis W Contrast  03/20/2013   *RADIOLOGY REPORT*  Clinical Data: The abdominal pain.  Nausea vomiting.  Diabetes. Cirrhosis.  Hepatitis C.  CT ABDOMEN AND PELVIS WITH CONTRAST  Technique:  Multidetector CT imaging of the abdomen and pelvis was performed following the standard protocol during bolus administration of intravenous contrast.  Contrast: 50mL OMNIPAQUE IOHEXOL 300 MG/ML  SOLN, OMNIPAQUE IOHEXOL 300 MG/ML  SOLN  Comparison: 10/13/2012  Findings: Stable mildly prominent epicardial lymph nodes.  Trace perihepatic ascites.  Lobulated liver contour compatible with cirrhosis.  Prominent but stable splenomegaly noted with considerable varices indicating portal venous hypertension.  No portal vein  thrombosis observed.  Small dependent gallstones in the gallbladder.  Adrenal glands and pancreas appear unremarkable.  Portacaval node short axis 1.6 cm, formerly the same.  Scattered small mesenteric and retroperitoneal lymph nodes, similar to prior.  Mild stranding at the root the mesentery, unchanged.  There is trace fluid tracking in the right paracolic gutter. Appendix normal where visualized.  Orally administered contrast is only in the duodenum and proximal jejunum at the time of imaging.  Small hypodense lesions in the right kidney are likely cyst but technically nonspecific.  There is a 3 x 1 mm left kidney lower pole nonobstructive calculus and a 1 mm left mid kidney nonobstructive calculus.  No hydronephrosis or hydroureter. Urinary bladder unremarkable.  Small amount of pelvic ascites. Left later renal vein collateral to the left gonadal vein observed.  There is no appreciable excretion of contrast medium from the kidneys on the delayed phase images.  IMPRESSION:  1.  Abnormal bilateral delayed nephrogram, with lack of excretion of contrast into the collecting systems 4 minutes out from contrast injection.  Differential diagnostic considerations include acute tubular necrosis and acute glomerulonephritis.  I telephoned the radiology department and the technologist noted that the patient was responding and interacting normally after the scan, making hypotensive shock as a cause for the delayed nephrogram unlikely. 2.  Nonobstructive left nephrolithiasis. 3.  Cirrhosis with stable splenomegaly and collaterals.  Mild ascites potentially from hypoalbuminemia.  4.  borderline prominent lymph nodes, similar to prior and nonspecific. 5.  Cholelithiasis.   Original Report Authenticated By: Gaylyn Rong, M.D.   Dg Abd Portable 1v  03/20/2013   *RADIOLOGY REPORT*  Clinical Data: Delayed nephrogram, assess passage of contrast  PORTABLE ABDOMEN - 1 VIEW  Comparison: 03/20/2013 CT  Findings: The left kidney  is incompletely visualized.  Contrast has passed distally into the bladder, with partial visualization of the distal ureters bilaterally.  Persistent right nephrogram is re- identified.  Contrast within nondilated bowel is re-identified.  IMPRESSION: Some interval passage of contrast into the bladder is noted with partial visualization of the distal ureters bilaterally, but with persistent right nephrogram.  Previous differential considerations are as per prior report.   Original Report Authenticated By: Christiana Pellant, M.D.    Microbiology: Recent Results (from the past 240 hour(s))  URINE CULTURE     Status: None   Collection Time    03/21/13  6:00 PM  Result Value Range Status   Specimen Description URINE, CLEAN CATCH   Final   Special Requests NONE   Final   Culture  Setup Time     Final   Value: 03/21/2013 18:47     Performed at [facility] Count     Final   Value: 20,OOO COLONIES/ML     Performed at [facility]   Culture     Final   Value: Multiple bacterial morphotypes present, none predominant. Suggest appropriate recollection if clinically indicated.     Performed at [facility]   Report Status 03/22/2013 FINAL   Final     Labs: Basic Metabolic Panel:  Recent Labs Lab 03/20/13 1356 03/21/13 0616 03/22/13 0606 03/23/13 0610  NA 132* 135 134* 137  K 4.6 4.7 4.4 4.0  CL 101 107 103 105  CO2 22 22 27 26   GLUCOSE 251* 157* 127* 105*  BUN 15 26* 27* 18  CREATININE 1.02 1.06 0.96 0.80  CALCIUM 8.9 7.8* 8.8 8.2*   Liver Function Tests:  Recent Labs Lab 03/20/13 1356 03/21/13 0616 03/23/13 0610  AST 301* 176* 158*  ALT 161* 118* 94*  ALKPHOS 131* 93 81  BILITOT 2.8* 2.8* 2.0*  PROT 7.5 6.4 5.8*  ALBUMIN 3.2* 2.8* 2.5*    Recent Labs Lab 03/20/13 1356  LIPASE 76*    Recent Labs Lab 03/20/13 1423  AMMONIA 107*   CBC:  Recent Labs Lab 03/20/13 1356 03/21/13 0616 03/22/13 0606  WBC 8.0 6.7 8.0  NEUTROABS  6.9  --   --   HGB 13.5 11.3* 12.2*  HCT 40.5 33.3* 36.7*  MCV 96.7 96.2 98.4  PLT 63* 46* 70*   Cardiac Enzymes: No results found for this basename: CKTOTAL, CKMB, CKMBINDEX, TROPONINI,  in the last 168 hours BNP: BNP (last 3 results) No results found for this basename: PROBNP,  in the last 8760 hours CBG:  Recent Labs Lab 03/22/13 0549 03/22/13 1153 03/22/13 1737 03/22/13 2334 03/23/13 0637  GLUCAP 102* 140* 124* 109* 100*       Signed:  BLACK,KAREN M  [facility] 03/23/2013, 9:16 AM   Attending note:  Patient seen and independently examined. Above note reviewed. The etiology of his abdominal pain is not entirely clear. He continues to have right upper back pain as well as right upper quadrant pain. His symptoms are somewhat better after being given pain medications. He is eating without difficulty, ambulating without any problems. Objectively, he appears to be resting comfortably in bed. He appears to have tenderness with even mild palpation of his skin. He has had an extensive workup including CT of the abdomen and pelvis, ultrasound of the abdomen. Liver enzymes are elevated on admission, but the patient does have a history of hepatitis C. Since admission, his liver enzymes have continuously trended down. He was seen by general surgery and did not feel that the patient has cholecystitis requiring surgical intervention. He was also seen by nephrology as well as urology for a delayed nephrogram on initial CT. This was followed up with a KUB which showed passing of dye into the bladder and resolution of nephrogram. Patient will followup with both nephrology and urology in the outpatient setting. Repeat CT scan of the abdomen and pelvis may be considered if the patient's symptoms do not improve/get worse. He does not appear to have any infectious source. Vitals are otherwise stable and labs are improving. The patient is felt safe to discharge home  and has been advised to  return to the emergency room if he has any change in his clinical condition.  MEMON,JEHANZEB

## 2013-03-23 NOTE — Progress Notes (Addendum)
Pt is to be discharged home today. Pt is in NAD, IV is out, all paperwork has been reviewed/discussed with patient, and there are no questions/concerns at this time. Assessment is unchanged from this morning. Pt is to be accompanied downstairs by staff and family via ambulation. Pt refused the wheelchair.

## 2013-03-23 NOTE — Progress Notes (Signed)
[id number] 

## 2013-03-23 NOTE — Consult Note (Signed)
NAME:  [name], [name] NO.:  [id number]  MEDICAL RECORD NO.:  [id number]  LOCATION:  A335                          FACILITY:  [facility]  PHYSICIAN:  Ky Barban, M.D.DATE OF BIRTH:  08/25/1960  DATE OF CONSULTATION: DATE OF DISCHARGE:                                CONSULTATION   CHIEF COMPLAINT:  Pain in the right upper quadrant, right flank.  HISTORY OF PRESENT ILLNESS:  A 52 year old gentleman who has multiple other problems, history of hepatitis C cirrhosis, came to the emergency room with acute and chronic mild right upper quadrant pain, right CVA pain, radiating downwards and forward.  CT scan of the abdomen and pelvis revealed bilateral delayed nephrogram, but no hydronephrosis, no stone.  There was some question he has a gallstone.  He was evaluated by general surgeon today and he does not think he has a gallstone.  There is no evidence of any cholecystitis, no evidence of thick gallbladder wall or any pericholecystitis.  He does not think he needs to have cholecystectomy, but anyway I was called in to see this patient.  He still has a mild pain in the right upper quadrant and he has a persistent nephrogram.  He had a KUB done today which shows the nephrogram has disappeared.  No history of fever, chills, or any voiding difficulty.  He noted some chills at home, feeling of fever, severe several episodes of vomiting.  No blood and episode of diarrhea.  PAST MEDICAL HISTORY:  Hypertension, diabetes, GERD, internal hemorrhoids, cirrhosis, COPD, hepatitis C, splenomegaly.  PAST SURGICAL HISTORY:  Sigmoidoscopy, upper GI endoscopy, right knee surgery, hemorrhoid surgery, esophageal biopsy.  PERSONAL HISTORY:  He says he never smoked.  Remote use of IV illicit drug.  History of alcoholism in the past, not any more.  REVIEW OF SYSTEMS:  Otherwise, unremarkable.  PHYSICAL EXAMINATION:  VITAL SIGNS:  His temperature is 97.9, pulse 66 per minute, blood  pressure 140/69. ABDOMEN:  Soft, flat.  Liver, spleen, kidneys not palpable.  Mild deep tenderness, right upper quadrant. GU:  External genitalia is unremarkable. RECTAL:  Deferred. EXTREMITIES:  Normal.  LABORATORY DATA:  WBC count is 8000, hematocrit 36.7.  Sodium 134, potassium 4.4, chloride 103, CO2 is 27, BUN is 27, creatinine 0.96.  IMPRESSION:  Pain in the right upper quadrant,  etiology, and I do not know if it is renal origin, could be coming from the liver, but this is not the cholecystitis type of pain.  The other thing is persistent delayed nephrogram without any hydronephrosis.  Renal ultrasound otherwise unremarkable. No hydronephrosis.  I think I will recommend that he had a CT scan with and without contrast to see if the pathologies there or gone.  If it is still there, we may consider doing a retrograde pyelogram on the right side.     Ky Barban, M.D.     MIJ/MEDQ  D:  03/22/2013  T:  03/23/2013  Job:  [id number]

## 2013-03-24 NOTE — Progress Notes (Signed)
NAME:  [name], GO NO.:  [id number]  MEDICAL RECORD NO.:  [id number]  LOCATION:                                 FACILITY:  PHYSICIAN:  Ky Barban, M.D.DATE OF BIRTH:  1961-03-03  DATE OF PROCEDURE:  03/23/2013 DATE OF DISCHARGE:  03/23/2013                                PROGRESS NOTE   Mr. [name] is a gentleman who is 52 years old, was admitted with pain in the right upper quadrant.  CT scan showed persistent right nephrogram and no other pathology, no hydronephrosis, no stone, no obstruction. His pain is most likely muscular type because with Flexeril, there was much improvement.  I do not think there is pain associated with kidney. The kidney does not have any pathology and follow up KUB shows the contrast has completely disappeared from the bladder.  I want to do another CT with contrast to see if we can repeat the pathology on CT scan because he never knew why he had persistent nephrogram.  Anyway, it was just with that idea.  The radiologist does not think it will help him with the diagnosis, I agree, so he can be discharged home.  I will see him in the office in a couple of weeks.     Ky Barban, M.D.     MIJ/MEDQ  D:  03/23/2013  T:  03/24/2013  Job:  [id number]

## 2013-03-29 ENCOUNTER — Encounter: Payer: Self-pay | Admitting: Gastroenterology

## 2013-03-30 ENCOUNTER — Telehealth: Payer: Self-pay

## 2013-03-30 ENCOUNTER — Ambulatory Visit (INDEPENDENT_AMBULATORY_CARE_PROVIDER_SITE_OTHER): Payer: Medicaid Other | Admitting: Gastroenterology

## 2013-03-30 ENCOUNTER — Encounter: Payer: Self-pay | Admitting: Gastroenterology

## 2013-03-30 VITALS — BP 116/52 | HR 60 | Temp 98.3°F | Ht 67.0 in | Wt 285.2 lb

## 2013-03-30 DIAGNOSIS — K7460 Unspecified cirrhosis of liver: Secondary | ICD-10-CM

## 2013-03-30 NOTE — Progress Notes (Signed)
cc'd to PCP °

## 2013-03-30 NOTE — Assessment & Plan Note (Signed)
ELEVATED AFP MOST LIKELY DUE TO INFLAMMATION. AUG 2014: U/S/CT ABD W/ IVC-NO HEPATIC LESION  OPV JAN 2015-LABS 1 WEEK PRIOR TO VISIT(AFP/PT/INR/CMP/CBC) CONSIDER MRI ABD WITH EOVIST IF AFP CONTINUES TO RISE LOSE 10 LBS. EXPLAINED BEING OBESE CAN MAKE LIVER DISEASE WORSE SEE [facility] IN [place] TO CONSIDER TREATMENT

## 2013-03-30 NOTE — Patient Instructions (Addendum)
FOLLOW UP IN JAN 2015. YOU NEED LABS 1 WEEK PRIOR TO YOUR VISIT.   LOSE 10 LBS. BEING OBESE CAN MAKE LIVER DISEASE WORSE  SEE [facility] IN [place] TO CONSIDER TREATMENT WITH TRIPLE THERAPY.

## 2013-03-30 NOTE — Telephone Encounter (Signed)
LMOM for pt that the number to call the [facility] is [phone]. ( They said they had difficulty before trying to call the number they had. ).

## 2013-03-30 NOTE — Progress Notes (Signed)
Subjective:    Patient ID: [name], male    DOB: 1960-10-27, 52 y.o.   MRN: [id number]  [name], PA-C  HPI Questions about HEP C TREATMENT. HAVING TROUBLE GETTING IN CONTACT WITH DR. ZACKS. WAS HOSPITALIZED FOR ABDOMINAL PAIN AND DIARRHEA. SX BETTER: AIN'T FREEZING/ABD PAIN NO WORSE. BLOOD IN URINE. BMs: 1X/DAY. HAS DIARRHEA: ONE A DAY. PAIN IN UPPER PART MOSTLY UPPER AND IT GETS BETTER  AND WORSE DEPENDING ON HOW HE MOVES. MAY GET CHOKED ONCE IN A WHILE WHEN FOOD NOT CHEWED UP GOOD AND NOT HAVING TEETH. REFLUX NOT CONTROLLED.  PT DENIES FEVER, CHILLS, BRBPR, nausea, vomiting, OR melena.   Past Medical History  Diagnosis Date  . Hypertension   . Diabetes mellitus   . GERD (gastroesophageal reflux disease)     DEC 2010 EGD/Bx REACTIVE GASTROPATHY, NO VARICES  . Hemorrhoids, internal   . BMI 40.0-44.9, adult OCT 2010 269 LBS    APR 2012 279 LBS  . 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 IV drug abuse REMOTE  . Hepatitis 2010 HEP C    AST 509 ALT 267 ALK PHOS 165 ALB 3.8 NEG IGM HAV/HBSAg  . Gallstone AUG 2012 1 CM  . GERD 10/25/2009  . COPD (chronic obstructive pulmonary disease)   . Hepatitis C   . Pancytopenia 2013  . Splenomegaly 2013  . Other pancytopenia 02/18/2013  . Iron (Fe) deficiency anemia 02/18/2013  . Splenomegaly 02/18/2013    Past Surgical History  Procedure Laterality Date  . Sigmoidoscopy      2001 DR. FLEISCHMAN INTERNAL HERMORRHOIDS  . Upper gastrointestinal endoscopy  DEC 2010    BENIGN POLYPS, GASTRITIS, ?phg  . Knee surgery  RIGHT  . Hemorrhoid surgery    . Esophageal biopsy  09/08/2011    WUJ:[id number] gastritis/Polyps, multiple in the body of the stomach   No Known Allergies  Current Outpatient Prescriptions  Medication Sig Dispense Refill  . albuterol (PROVENTIL HFA;VENTOLIN HFA) 108 (90 BASE) MCG/ACT inhaler Inhale 2 puffs into the lungs every 6 (six) hours as needed. Shortness of breath      . esomeprazole  (NEXIUM) 40 MG capsule Take 1 capsule (40 mg total) by mouth 2 (two) times daily. One po once to twice daily before a meal.  60 capsule  5  . glipiZIDE (GLUCOTROL) 10 MG tablet Take 10 mg by mouth 2 (two) times daily before a meal.        . insulin glargine (LANTUS) 100 UNIT/ML injection Inject 10 Units into the skin at bedtime.      . metFORMIN (GLUCOPHAGE) 1000 MG tablet Take 1,000 mg by mouth 2 (two) times daily with a meal.      . metoprolol (LOPRESSOR) 50 MG tablet Take 50 mg by mouth 2 (two) times daily.      Marland Kitchen oxyCODONE (OXY IR/ROXICODONE) 5 MG immediate release tablet Take 1-2 tablets (5-10 mg total) by mouth every 4 (four) hours as needed for pain.  30 tablet  0  . quinapril (ACCUPRIL) 40 MG tablet Take 40 mg by mouth at bedtime.       No current facility-administered medications for this visit.      Review of Systems     Objective:   Physical Exam  Vitals reviewed. Constitutional: He is oriented to person, place, and time. No distress.  HENT:  Head: Normocephalic and atraumatic.  Mouth/Throat: Oropharynx is clear and moist. No oropharyngeal exudate.  Eyes: Pupils are equal,  round, and reactive to light. No scleral icterus.  Neck: Normal range of motion. Neck supple.  Cardiovascular: Normal rate, regular rhythm and normal heart sounds.   Pulmonary/Chest: Effort normal and breath sounds normal. No respiratory distress.  Abdominal: Soft. Bowel sounds are normal. He exhibits no distension. There is tenderness. There is no rebound and no guarding.  MILD RUQ/EPIGASTRIUM/BLQs TTP   Musculoskeletal: He exhibits edema (BIL LE 1-2+).  Lymphadenopathy:    He has no cervical adenopathy.  Neurological: He is alert and oriented to person, place, and time.  NO  NEW FOCAL DEFICITS   Psychiatric:  FLAT AFFECT, SLIGHTLY ANXIOUS MOOD           Assessment & Plan:

## 2013-03-30 NOTE — Progress Notes (Signed)
Reminder appt made 

## 2013-04-07 ENCOUNTER — Emergency Department (HOSPITAL_COMMUNITY)
Admission: EM | Admit: 2013-04-07 | Discharge: 2013-04-08 | Disposition: A | Discharge status: Home / Self Care | Payer: Medicaid Other | Attending: Emergency Medicine | Admitting: Emergency Medicine

## 2013-04-07 ENCOUNTER — Emergency Department (HOSPITAL_COMMUNITY): Payer: Medicaid Other

## 2013-04-07 ENCOUNTER — Encounter (HOSPITAL_COMMUNITY): Payer: Self-pay

## 2013-04-07 DIAGNOSIS — R339 Retention of urine, unspecified: Secondary | ICD-10-CM | POA: Insufficient documentation

## 2013-04-07 DIAGNOSIS — K219 Gastro-esophageal reflux disease without esophagitis: Secondary | ICD-10-CM | POA: Insufficient documentation

## 2013-04-07 DIAGNOSIS — R300 Dysuria: Secondary | ICD-10-CM | POA: Insufficient documentation

## 2013-04-07 DIAGNOSIS — Z8659 Personal history of other mental and behavioral disorders: Secondary | ICD-10-CM | POA: Insufficient documentation

## 2013-04-07 DIAGNOSIS — R197 Diarrhea, unspecified: Secondary | ICD-10-CM | POA: Insufficient documentation

## 2013-04-07 DIAGNOSIS — Z8619 Personal history of other infectious and parasitic diseases: Secondary | ICD-10-CM | POA: Insufficient documentation

## 2013-04-07 DIAGNOSIS — R609 Edema, unspecified: Secondary | ICD-10-CM | POA: Insufficient documentation

## 2013-04-07 DIAGNOSIS — I10 Essential (primary) hypertension: Secondary | ICD-10-CM | POA: Insufficient documentation

## 2013-04-07 DIAGNOSIS — Z792 Long term (current) use of antibiotics: Secondary | ICD-10-CM | POA: Insufficient documentation

## 2013-04-07 DIAGNOSIS — R1084 Generalized abdominal pain: Secondary | ICD-10-CM | POA: Insufficient documentation

## 2013-04-07 DIAGNOSIS — G8929 Other chronic pain: Secondary | ICD-10-CM | POA: Insufficient documentation

## 2013-04-07 DIAGNOSIS — R05 Cough: Secondary | ICD-10-CM | POA: Insufficient documentation

## 2013-04-07 DIAGNOSIS — R21 Rash and other nonspecific skin eruption: Secondary | ICD-10-CM | POA: Insufficient documentation

## 2013-04-07 DIAGNOSIS — J3489 Other specified disorders of nose and nasal sinuses: Secondary | ICD-10-CM | POA: Insufficient documentation

## 2013-04-07 DIAGNOSIS — N390 Urinary tract infection, site not specified: Secondary | ICD-10-CM | POA: Insufficient documentation

## 2013-04-07 DIAGNOSIS — Z794 Long term (current) use of insulin: Secondary | ICD-10-CM | POA: Insufficient documentation

## 2013-04-07 DIAGNOSIS — D689 Coagulation defect, unspecified: Secondary | ICD-10-CM | POA: Insufficient documentation

## 2013-04-07 DIAGNOSIS — M545 Low back pain, unspecified: Secondary | ICD-10-CM | POA: Insufficient documentation

## 2013-04-07 DIAGNOSIS — R51 Headache: Secondary | ICD-10-CM | POA: Insufficient documentation

## 2013-04-07 DIAGNOSIS — Z79899 Other long term (current) drug therapy: Secondary | ICD-10-CM | POA: Insufficient documentation

## 2013-04-07 DIAGNOSIS — E119 Type 2 diabetes mellitus without complications: Secondary | ICD-10-CM | POA: Insufficient documentation

## 2013-04-07 DIAGNOSIS — J4489 Other specified chronic obstructive pulmonary disease: Secondary | ICD-10-CM | POA: Insufficient documentation

## 2013-04-07 DIAGNOSIS — Z862 Personal history of diseases of the blood and blood-forming organs and certain disorders involving the immune mechanism: Secondary | ICD-10-CM | POA: Insufficient documentation

## 2013-04-07 DIAGNOSIS — Z8719 Personal history of other diseases of the digestive system: Secondary | ICD-10-CM | POA: Insufficient documentation

## 2013-04-07 DIAGNOSIS — J449 Chronic obstructive pulmonary disease, unspecified: Secondary | ICD-10-CM | POA: Insufficient documentation

## 2013-04-07 DIAGNOSIS — R112 Nausea with vomiting, unspecified: Secondary | ICD-10-CM | POA: Insufficient documentation

## 2013-04-07 DIAGNOSIS — R109 Unspecified abdominal pain: Secondary | ICD-10-CM

## 2013-04-07 DIAGNOSIS — R059 Cough, unspecified: Secondary | ICD-10-CM | POA: Insufficient documentation

## 2013-04-07 LAB — CBC WITH DIFFERENTIAL/PLATELET
Basophils Absolute: 0 10*3/uL (ref 0.0–0.1)
Basophils Relative: 0 % (ref 0–1)
Eosinophils Absolute: 0.1 10*3/uL (ref 0.0–0.7)
HCT: 34.1 % — ABNORMAL LOW (ref 39.0–52.0)
Lymphocytes Relative: 29 % (ref 12–46)
Lymphs Abs: 1.2 10*3/uL (ref 0.7–4.0)
MCH: 32.7 pg (ref 26.0–34.0)
MCHC: 34.3 g/dL (ref 30.0–36.0)
Monocytes Absolute: 0.4 10*3/uL (ref 0.1–1.0)
Neutro Abs: 2.4 10*3/uL (ref 1.7–7.7)
RDW: 14.1 % (ref 11.5–15.5)

## 2013-04-07 LAB — LIPASE, BLOOD: Lipase: 126 U/L — ABNORMAL HIGH (ref 11–59)

## 2013-04-07 LAB — URINALYSIS, ROUTINE W REFLEX MICROSCOPIC
Bilirubin Urine: NEGATIVE
Leukocytes, UA: NEGATIVE
Nitrite: POSITIVE — AB
Specific Gravity, Urine: >1.03 — ABNORMAL HIGH (ref 1.005–1.030)
Urobilinogen, UA: 4 mg/dL — ABNORMAL HIGH (ref 0.0–1.0)
pH: 6.5 (ref 5.0–8.0)

## 2013-04-07 LAB — COMPREHENSIVE METABOLIC PANEL
Albumin: 3 g/dL — ABNORMAL LOW (ref 3.5–5.2)
BUN: 16 mg/dL (ref 6–23)
Calcium: 8.6 mg/dL (ref 8.4–10.5)
Creatinine, Ser: 0.66 mg/dL (ref 0.50–1.35)
GFR calc Af Amer: >90 mL/min (ref >90)
Glucose, Bld: 182 mg/dL — ABNORMAL HIGH (ref 70–99)
Potassium: 3.8 mEq/L (ref 3.5–5.1)
Total Protein: 7 g/dL (ref 6.0–8.3)

## 2013-04-07 LAB — URINE MICROSCOPIC-ADD ON

## 2013-04-07 MED ORDER — ONDANSETRON HCL 4 MG/2ML IJ SOLN
4.0000 mg | Freq: Once | INTRAMUSCULAR | Status: AC
  Administered 2013-04-07: 4 mg via INTRAVENOUS
  Filled 2013-04-07: qty 2

## 2013-04-07 MED ORDER — SODIUM CHLORIDE 0.9 % IV BOLUS (SEPSIS)
250.0000 mL | Freq: Once | INTRAVENOUS | Status: AC
  Administered 2013-04-07: 250 mL via INTRAVENOUS

## 2013-04-07 MED ORDER — SODIUM CHLORIDE 0.9 % IV SOLN
INTRAVENOUS | Status: DC
  Administered 2013-04-07: 23:00:00 via INTRAVENOUS

## 2013-04-07 MED ORDER — HYDROMORPHONE HCL PF 1 MG/ML IJ SOLN
1.0000 mg | Freq: Once | INTRAMUSCULAR | Status: AC
  Administered 2013-04-07: 1 mg via INTRAVENOUS
  Filled 2013-04-07: qty 1

## 2013-04-07 NOTE — ED Provider Notes (Signed)
Scribed for Shelda Jakes, MD, the patient was seen in room APA17/APA17. This chart was scribed by Lewanda Rife, ED scribe. Patient's care was started at 2200  CSN: [id number]     Arrival date & time 04/07/13  2106 History   First MD Initiated Contact with Patient 04/07/13 2144     Chief Complaint  Patient presents with  . Urinary Retention  . Hematuria   (Consider location/radiation/quality/duration/timing/severity/associated sxs/prior Treatment) The history is provided by the patient.   HPI Comments: [name] is a 52 y.o. male who presents to the Emergency Department with a hx of cirrhosis and pancytopenia complaining of worsening intermittent hematuria onset since prior admission in March 20, 2013. Reports associated unchanged diarrhea, low back pain, abdominal pain, bleeding easily, headache, chronic rash, cough, congestion, and emesis. Denies associated melena, fevers, chills, chest pain, and shortness of breath. Reports abdominal pain is aggravated by touch and certain positions. Reports alleviated in certain positions. Reports he ran out of pain medications. Reports August admission for 4 days. Denies hx of cancer. Past Medical History  Diagnosis Date  . Hypertension   . Diabetes mellitus   . GERD (gastroesophageal reflux disease)     DEC 2010 EGD/Bx REACTIVE GASTROPATHY, NO VARICES  . Hemorrhoids, internal   . BMI 40.0-44.9, adult OCT 2010 269 LBS    APR 2012 279 LBS AUG 2014 185 LBS  . 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 IV drug abuse REMOTE  . Hepatitis 2010 HEP C    AST 509 ALT 267 ALK PHOS 165 ALB 3.8 NEG IGM HAV/HBSAg  . Gallstone AUG 2012 1 CM  . GERD 10/25/2009  . COPD (chronic obstructive pulmonary disease)   . Hepatitis C   . Pancytopenia 2013  . Splenomegaly 2013  . Other pancytopenia 02/18/2013  . Iron (Fe) deficiency anemia 02/18/2013  . Splenomegaly 02/18/2013   Past Surgical History  Procedure Laterality Date  .  Sigmoidoscopy      2001 DR. FLEISCHMAN INTERNAL HERMORRHOIDS  . Upper gastrointestinal endoscopy  DEC 2010    BENIGN POLYPS, GASTRITIS, ?phg  . Knee surgery  RIGHT  . Hemorrhoid surgery    . Esophageal biopsy  09/08/2011    HQI:ONGEXBMW gastritis/Polyps, multiple in the body of the stomach   Family History  Problem Relation Age of Onset  . Colon cancer Neg Hx   . Anesthesia problems Neg Hx   . Hypotension Neg Hx   . Malignant hyperthermia Neg Hx   . Pseudochol deficiency Neg Hx   . Cancer Father    History  Substance Use Topics  . Smoking status: Never Smoker   . Smokeless tobacco: Never Used  . Alcohol Use: No     Comment: 30 years ago    Review of Systems  Constitutional: Negative for fever and chills.  HENT: Positive for congestion.   Eyes: Negative for visual disturbance.  Respiratory: Positive for cough. Negative for shortness of breath.   Cardiovascular: Negative for chest pain.  Gastrointestinal: Positive for nausea, vomiting, abdominal pain and diarrhea. Negative for blood in stool.  Genitourinary: Positive for dysuria, hematuria and difficulty urinating.  Musculoskeletal: Positive for back pain.  Skin: Positive for rash.  Neurological: Positive for headaches.  Hematological: Bruises/bleeds easily.  Psychiatric/Behavioral: Negative for confusion.    Allergies  Review of patient's allergies indicates no known allergies.  Home Medications   Current Outpatient Rx  Name  Route  Sig  Dispense  Refill  .  albuterol (PROVENTIL HFA;VENTOLIN HFA) 108 (90 BASE) MCG/ACT inhaler   Inhalation   Inhale 2 puffs into the lungs every 6 (six) hours as needed. Shortness of breath         . esomeprazole (NEXIUM) 40 MG capsule   Oral   Take 40 mg by mouth 2 (two) times daily.         Marland Kitchen glipiZIDE (GLUCOTROL) 10 MG tablet   Oral   Take 10 mg by mouth 2 (two) times daily before a meal.           . insulin glargine (LANTUS) 100 UNIT/ML injection   Subcutaneous    Inject 10 Units into the skin at bedtime.         . metFORMIN (GLUCOPHAGE) 1000 MG tablet   Oral   Take 1,000 mg by mouth 2 (two) times daily with a meal.         . metoprolol (LOPRESSOR) 50 MG tablet   Oral   Take 50 mg by mouth 2 (two) times daily.         Marland Kitchen oxyCODONE (OXY IR/ROXICODONE) 5 MG immediate release tablet   Oral   Take 5 mg by mouth every 4 (four) hours as needed for pain.         Marland Kitchen quinapril (ACCUPRIL) 40 MG tablet   Oral   Take 40 mg by mouth at bedtime.         . cephALEXin (KEFLEX) 500 MG capsule   Oral   Take 1 capsule (500 mg total) by mouth 4 (four) times daily.   28 capsule   0   . oxyCODONE (ROXICODONE) 5 MG immediate release tablet   Oral   Take 1 tablet (5 mg total) by mouth every 6 (six) hours as needed for pain.   20 tablet   0    BP 123/64  Pulse 68  Temp(Src) 97.5 F (36.4 C) (Oral)  Resp 20  Ht 5\' 6"  (1.676 m)  Wt 286 lb (129.729 kg)  BMI 46.18 kg/m2  SpO2 100% Physical Exam  Nursing note and vitals reviewed. Constitutional: He is oriented to person, place, and time. He appears well-developed and well-nourished. No distress.  HENT:  Head: Normocephalic and atraumatic.  Eyes: EOM are normal.  Neck: Neck supple. No tracheal deviation present.  Cardiovascular: Normal rate, regular rhythm and normal heart sounds.   No murmur heard. Pulmonary/Chest: Effort normal and breath sounds normal. No respiratory distress. He has no wheezes.  Abdominal: Soft. Bowel sounds are normal. There is generalized tenderness (diffuse ). There is CVA tenderness (right ).  Musculoskeletal: Normal range of motion. He exhibits edema (trace pitting edema).  Neurological: He is alert and oriented to person, place, and time.  Skin: Skin is warm and dry. Rash noted.  10 cm chronic skin discoloration bilateral axilla   Psychiatric: He has a normal mood and affect. His behavior is normal.    ED Course  Procedures (including critical care  time) Medications  0.9 %  sodium chloride infusion ( Intravenous New Bag/Given 04/07/13 2239)  cefTRIAXone (ROCEPHIN) 1 g in dextrose 5 % 50 mL IVPB (not administered)  sodium chloride 0.9 % bolus 250 mL (0 mLs Intravenous Stopped 04/07/13 2250)  HYDROmorphone (DILAUDID) injection 1 mg (1 mg Intravenous Given 04/07/13 2237)  ondansetron (ZOFRAN) injection 4 mg (4 mg Intravenous Given 04/07/13 2237)    Labs Review Labs Reviewed  CBC WITH DIFFERENTIAL - Abnormal; Notable for the following:    RBC 3.58 (*)  Hemoglobin 11.7 (*)    HCT 34.1 (*)    Platelets 71 (*)    All other components within normal limits  COMPREHENSIVE METABOLIC PANEL - Abnormal; Notable for the following:    Glucose, Bld 182 (*)    Albumin 3.0 (*)    AST 212 (*)    ALT 120 (*)    Total Bilirubin 1.4 (*)    All other components within normal limits  LIPASE, BLOOD - Abnormal; Notable for the following:    Lipase 126 (*)    All other components within normal limits  URINALYSIS, ROUTINE W REFLEX MICROSCOPIC - Abnormal; Notable for the following:    Color, Urine BROWN (*)    APPearance CLOUDY (*)    Specific Gravity, Urine >1.030 (*)    Glucose, UA 100 (*)    Hgb urine dipstick LARGE (*)    Ketones, ur TRACE (*)    Protein, ur >300 (*)    Urobilinogen, UA 4.0 (*)    Nitrite POSITIVE (*)    All other components within normal limits  AMMONIA - Abnormal; Notable for the following:    Ammonia 90 (*)    All other components within normal limits  URINE MICROSCOPIC-ADD ON - Abnormal; Notable for the following:    Bacteria, UA MANY (*)    All other components within normal limits   Imaging Review Ct Abdomen Pelvis Wo Contrast  04/07/2013   *RADIOLOGY REPORT*  Clinical Data: Urinary retention, hematuria, right flank pain  CT ABDOMEN AND PELVIS WITHOUT CONTRAST  Technique:  Multidetector CT imaging of the abdomen and pelvis was performed following the standard protocol without intravenous contrast.  Comparison:  03/20/2013, 03/12/2010  Findings: Right middle lobe peripheral cystic change and or bulla with associated punctate calcifications, presumably a post infectious or posttraumatic component, similar to priors.  Normal heart size.  Organ abnormality/lesion detection is limited in the absence of intravenous contrast. Within this limitation, cirrhotic liver morphology.  Dependent gallstones. Mild gallbladder wall thickening.  No biliary ductal dilatation.  Splenomegaly. Limited pancreatic characterization without appreciable ductal dilatation.  Unremarkable adrenal glands.  Bilateral nonobstructing renal stones.  No hydroureteronephrosis.  No CT evidence for colitis.  Normal appendix.  Small bowel loops are normal course and caliber.  Trace free fluid.  Mesenteric root fat stranding.  Prominent porta hepatis and gastrohepatic lymph nodes.  1.1 cm short axis lymph node anterior to the stomach on image 39 series 2.  No free intraperitoneal air.  Normal caliber aorta and branch vessels.  Prominent mesenteric/perisplenic collaterals.  Thin-walled bladder.  Nonspecific prostatic calcifications. Prominent gonadal vein/varices extend into the left inguinal canal, incompletely imaged.  Multilevel degenerative changes of the imaged spine. No acute or aggressive appearing osseous lesion.  IMPRESSION: Nonobstructing renal stones.  No hydroureteronephrosis or ureteral calculi.  Cirrhotic liver morphology with stigmata of portal hypertension including splenomegaly, trace ascites, and varices as above. Recommend follow-up liver MRI for HCC screening.  Gallstone and mild gallbladder wall thickening, nonspecific in the setting of hepatitis.  Correlate with LFTs and symptoms if concerned for acute biliary pathology.  Mesenteric root fat stranding may reflect third spacing, mesenteritis, or reactive change from adjacent organs such as pancreatitis or enteritis. Correlate with lipase.  Prominent porta hepatis and mesenteric lymph nodes  warrant continued close attention on follow-up.   Original Report Authenticated By: Jearld Lesch, M.D.   Dg Chest 2 View  04/07/2013   *RADIOLOGY REPORT*  Clinical Data: Urinary retention.  Chest congestion for 1 month.  CHEST - 2 VIEW  Comparison: 03/20/2013  Findings: The cardiac silhouette is normal in size and configuration.  The mediastinum is normal in contour caliber.  There are no hilar masses.  The lungs are clear.  No pleural effusion or pneumothorax.  The bony thorax is intact.  IMPRESSION: No active disease of the chest.   Original Report Authenticated By: Amie Portland, M.D.   Results for orders placed during the hospital encounter of 04/07/13  CBC WITH DIFFERENTIAL      Result Value Range   WBC 4.1  4.0 - 10.5 K/uL   RBC 3.58 (*) 4.22 - 5.81 MIL/uL   Hemoglobin 11.7 (*) 13.0 - 17.0 g/dL   HCT 91.4 (*) 78.2 - 95.6 %   MCV 95.3  78.0 - 100.0 fL   MCH 32.7  26.0 - 34.0 pg   MCHC 34.3  30.0 - 36.0 g/dL   RDW 21.3  08.6 - 57.8 %   Platelets 71 (*) 150 - 400 K/uL   Neutrophils Relative % 59  43 - 77 %   Lymphocytes Relative 29  12 - 46 %   Monocytes Relative 10  3 - 12 %   Eosinophils Relative 2  0 - 5 %   Basophils Relative 0  0 - 1 %   Neutro Abs 2.4  1.7 - 7.7 K/uL   Lymphs Abs 1.2  0.7 - 4.0 K/uL   Monocytes Absolute 0.4  0.1 - 1.0 K/uL   Eosinophils Absolute 0.1  0.0 - 0.7 K/uL   Basophils Absolute 0.0  0.0 - 0.1 K/uL   Smear Review PLATELETS APPEAR DECREASED    COMPREHENSIVE METABOLIC PANEL      Result Value Range   Sodium 138  135 - 145 mEq/L   Potassium 3.8  3.5 - 5.1 mEq/L   Chloride 107  96 - 112 mEq/L   CO2 22  19 - 32 mEq/L   Glucose, Bld 182 (*) 70 - 99 mg/dL   BUN 16  6 - 23 mg/dL   Creatinine, Ser 4.69  0.50 - 1.35 mg/dL   Calcium 8.6  8.4 - 62.9 mg/dL   Total Protein 7.0  6.0 - 8.3 g/dL   Albumin 3.0 (*) 3.5 - 5.2 g/dL   AST 528 (*) 0 - 37 U/L   ALT 120 (*) 0 - 53 U/L   Alkaline Phosphatase 114  39 - 117 U/L   Total Bilirubin 1.4 (*) 0.3 - 1.2  mg/dL   GFR calc non Af Amer >90  >90 mL/min   GFR calc Af Amer >90  >90 mL/min  LIPASE, BLOOD      Result Value Range   Lipase 126 (*) 11 - 59 U/L  URINALYSIS, ROUTINE W REFLEX MICROSCOPIC      Result Value Range   Color, Urine BROWN (*) YELLOW   APPearance CLOUDY (*) CLEAR   Specific Gravity, Urine >1.030 (*) 1.005 - 1.030   pH 6.5  5.0 - 8.0   Glucose, UA 100 (*) NEGATIVE mg/dL   Hgb urine dipstick LARGE (*) NEGATIVE   Bilirubin Urine NEGATIVE  NEGATIVE   Ketones, ur TRACE (*) NEGATIVE mg/dL   Protein, ur >413 (*) NEGATIVE mg/dL   Urobilinogen, UA 4.0 (*) 0.0 - 1.0 mg/dL   Nitrite POSITIVE (*) NEGATIVE   Leukocytes, UA NEGATIVE  NEGATIVE  AMMONIA      Result Value Range   Ammonia 90 (*) 11 - 60 umol/L  URINE MICROSCOPIC-ADD ON  Result Value Range   Squamous Epithelial / LPF RARE  RARE   WBC, UA 3-6  <3 WBC/hpf   RBC / HPF TOO NUMEROUS TO COUNT  <3 RBC/hpf   Bacteria, UA MANY (*) RARE    Date: 04/08/2013  Rate: 63  Rhythm: normal sinus rhythm  QRS Axis: normal  Intervals: QT prolonged  ST/T Wave abnormalities: normal  Conduction Disutrbances:none  Narrative Interpretation:   Old EKG Reviewed: none available      MDM   1. UTI (lower urinary tract infection)   2. ABDOMINAL PAIN, CHRONIC    Workup for the blood in the urine seems to be consistent with a new urinary tract infection has positive nitrite. Other pain may be related to his chronic pain. Patient with known cirrhosis etiology not clear. CT scan without evidence of any ureteral stones or any significant changes in the scan as far as the gallbladder and liver dose. Patient's labs are without any significant changes from his baseline abnormalities. Patient is nontoxic no acute distress. Patient without significant anemia platelet count is adequate at 70,000. Patient will be discharged home on Keflex we'll give 1 g of Rocephin here and patient's Roxicodone will be renewed for his chronic pain.  I had seen  the patient in taking care of him at his last admission on August 10.  I personally performed the services described in this documentation, which was scribed in my presence. The recorded information has been reviewed and is accurate.     Shelda Jakes, MD 04/08/13 [id number]

## 2013-04-07 NOTE — ED Notes (Signed)
Pt reports urinary diff/retention, blood in urine, states he was admitted here last week, states pain has continued since discharge

## 2013-04-08 MED ORDER — HYDROMORPHONE HCL PF 1 MG/ML IJ SOLN
1.0000 mg | Freq: Once | INTRAMUSCULAR | Status: AC
  Administered 2013-04-08: 1 mg via INTRAVENOUS
  Filled 2013-04-08: qty 1

## 2013-04-08 MED ORDER — OXYCODONE HCL 5 MG PO TABS: 5.0000 mg | ORAL_TABLET | Freq: Four times a day (QID) | ORAL | Status: DC | PRN

## 2013-04-08 MED ORDER — DEXTROSE 5 % IV SOLN
1.0000 g | Freq: Once | INTRAVENOUS | Status: AC
  Administered 2013-04-08: 1 g via INTRAVENOUS
  Filled 2013-04-08: qty 10

## 2013-04-08 MED ORDER — CEPHALEXIN 500 MG PO CAPS: 500.0000 mg | ORAL_CAPSULE | Freq: Four times a day (QID) | ORAL | Status: DC

## 2013-04-08 NOTE — ED Notes (Signed)
Pt wants more pain medication and EDP notified.

## 2013-04-09 LAB — URINE CULTURE: Colony Count: 5000

## 2013-06-21 ENCOUNTER — Other Ambulatory Visit (HOSPITAL_COMMUNITY): Payer: Self-pay

## 2013-06-21 NOTE — Progress Notes (Signed)
rescheduled

## 2013-06-22 ENCOUNTER — Ambulatory Visit (HOSPITAL_COMMUNITY): Payer: Self-pay | Admitting: Oncology

## 2013-06-29 ENCOUNTER — Encounter (HOSPITAL_COMMUNITY): Payer: Medicaid Other | Attending: Hematology and Oncology

## 2013-06-29 ENCOUNTER — Encounter (HOSPITAL_COMMUNITY): Payer: Medicaid Other

## 2013-06-29 ENCOUNTER — Encounter (HOSPITAL_COMMUNITY): Payer: Self-pay

## 2013-06-29 VITALS — BP 156/54 | HR 77 | Temp 98.3°F | Resp 20 | Wt 278.7 lb

## 2013-06-29 DIAGNOSIS — R161 Splenomegaly, not elsewhere classified: Secondary | ICD-10-CM

## 2013-06-29 DIAGNOSIS — D61818 Other pancytopenia: Secondary | ICD-10-CM

## 2013-06-29 DIAGNOSIS — D731 Hypersplenism: Secondary | ICD-10-CM

## 2013-06-29 DIAGNOSIS — D509 Iron deficiency anemia, unspecified: Secondary | ICD-10-CM

## 2013-06-29 DIAGNOSIS — B182 Chronic viral hepatitis C: Secondary | ICD-10-CM

## 2013-06-29 DIAGNOSIS — R1011 Right upper quadrant pain: Secondary | ICD-10-CM

## 2013-06-29 DIAGNOSIS — K7460 Unspecified cirrhosis of liver: Secondary | ICD-10-CM

## 2013-06-29 LAB — CBC WITH DIFFERENTIAL/PLATELET
Eosinophils Absolute: 0.1 10*3/uL (ref 0.0–0.7)
Hemoglobin: 10.8 g/dL — ABNORMAL LOW (ref 13.0–17.0)
Lymphocytes Relative: 25 % (ref 12–46)
MCH: 32.1 pg (ref 26.0–34.0)
MCHC: 33.6 g/dL (ref 30.0–36.0)
Monocytes Absolute: 0.4 10*3/uL (ref 0.1–1.0)
Neutrophils Relative %: 62 % (ref 43–77)
Platelets: 76 10*3/uL — ABNORMAL LOW (ref 150–400)
RBC: 3.36 MIL/uL — ABNORMAL LOW (ref 4.22–5.81)

## 2013-06-29 LAB — COMPREHENSIVE METABOLIC PANEL
ALT: 99 U/L — ABNORMAL HIGH (ref 0–53)
AST: 203 U/L — ABNORMAL HIGH (ref 0–37)
Albumin: 2.7 g/dL — ABNORMAL LOW (ref 3.5–5.2)
Alkaline Phosphatase: 136 U/L — ABNORMAL HIGH (ref 39–117)
Calcium: 8.8 mg/dL (ref 8.4–10.5)
Potassium: 3.6 mEq/L (ref 3.5–5.1)
Sodium: 139 mEq/L (ref 135–145)
Total Protein: 7.1 g/dL (ref 6.0–8.3)

## 2013-06-29 NOTE — Patient Instructions (Addendum)
[facility] Discharge Instructions  RECOMMENDATIONS MADE BY THE CONSULTANT AND ANY TEST RESULTS WILL BE SENT TO YOUR REFERRING PHYSICIAN.  EXAM FINDINGS BY THE PHYSICIAN TODAY AND SIGNS OR SYMPTOMS TO REPORT TO CLINIC OR PRIMARY PHYSICIAN: Exam and findings as discussed by Dr. Zigmund Daniel.  Will get an ultrasound of your abdomen.  And see you back in 3 months with labs and MD visit.  We will call you with any concerns found on the ultrasound.    MEDICATIONS PRESCRIBED:  none  INSTRUCTIONS/FOLLOW-UP: Ultrasound Tuesday.  Nothing to eat or drink after midnight the night before. Follow-up with blood work and MD visit in February.  Thank you for choosing [facility] to provide your oncology and hematology care.  To afford each patient quality time with our providers, please arrive at least 15 minutes before your scheduled appointment time.  With your help, our goal is to use those 15 minutes to complete the necessary work-up to ensure our physicians have the information they need to help with your evaluation and healthcare recommendations.    Effective January 1st, 2014, we ask that you re-schedule your appointment with our physicians should you arrive 10 or more minutes late for your appointment.  We strive to give you quality time with our providers, and arriving late affects you and other patients whose appointments are after yours.    Again, thank you for choosing [facility].  Our hope is that these requests will decrease the amount of time that you wait before being seen by our physicians.       _____________________________________________________________  Should you have questions after your visit to [facility], please contact our office at [phone] between the hours of 8:30 a.m. and 5:00 p.m.  Voicemails left after 4:30 p.m. will not be returned until the following business day.  For prescription refill requests, have your  pharmacy contact our office with your prescription refill request.

## 2013-06-29 NOTE — Progress Notes (Signed)
[name] presented for [employer]. Labs per MD order drawn via Peripheral Line 23 gauge needle inserted in left hand  Good blood return present. Procedure without incident.  Needle removed intact. Patient tolerated procedure well.

## 2013-06-29 NOTE — Progress Notes (Signed)
[facility]  OFFICE PROGRESS NOTE  [name], PA-C [facility] [address] [place] [address]  DIAGNOSIS: Iron (Fe) deficiency anemia - Plan: CBC with Differential, Iron and TIBC, Ferritin  Chronic hepatitis C without mention of hepatic coma - Plan: US Abdomen Complete  Splenomegaly - Plan: US Abdomen Complete  Hypersplenism - Plan: US Abdomen Complete  Other pancytopenia - Plan: CBC with Differential, Comprehensive metabolic panel, Iron and TIBC, Ferritin  CIRRHOSIS - Plan: CBC with Differential, Comprehensive metabolic panel, Iron and TIBC, Ferritin, AFP tumor marker  HEPATITIS C, CHRONIC - Plan: CBC with Differential, Comprehensive metabolic panel, Iron and TIBC, Ferritin, AFP tumor marker  Chief Complaint  Patient presents with  . Anemia    cirrhosis with hypersplenism    CURRENT THERAPY: Watchful expectation  INTERVAL HISTORY: [name] 52 y.o. 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 Iron deficiency was also present in the past. Cirrhosis was due to hepatitis C virus infection. He has developed increasing right upper quadrant abdominal pain with intermittent swelling of the abdomen as well as lower extremities. Urine is dark color. He experiences pruritus. He denies any cough, wheezing, PND, orthopnea, or palpitations. Denies diarrhea, constipation, melena, hematochezia, hematuria, epistaxis, hemoptysis, or lightening of stools. Appetite has been good. He denies any fever, night sweats, headache, or seizures.  MEDICAL HISTORY: Past Medical History  Diagnosis Date  . Hypertension   . Diabetes mellitus   . GERD (gastroesophageal reflux disease)     DEC 2010 EGD/Bx REACTIVE GASTROPATHY, NO VARICES  . Hemorrhoids, internal   . BMI 40.0-44.9, adult OCT 2010 269 LBS    APR 2012 279 LBS AUG 2014 185 LBS  . 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 IV drug abuse REMOTE  . Hepatitis 2010 HEP C    AST 509 ALT 267 ALK PHOS 165 ALB 3.8 NEG IGM HAV/HBSAg  . Gallstone AUG 2012 1 CM  . GERD 10/25/2009  . COPD (chronic obstructive pulmonary disease)   . Hepatitis C   . Pancytopenia 2013  . Splenomegaly 2013  . Other pancytopenia 02/18/2013  . Iron (Fe) deficiency anemia 02/18/2013  . Splenomegaly 02/18/2013    INTERIM HISTORY: has HEPATITIS C, CHRONIC; DM; ALCOHOL ABUSE; GERD; CIRRHOSIS; UNSPECIFIED DISEASE OF PANCREAS; FATIGUE; LIVER FUNCTION TESTS, ABNORMAL, HX OF; ABDOMINAL PAIN, CHRONIC; Gallstone; Colon cancer screening; Other pancytopenia; Iron (Fe) deficiency anemia; Splenomegaly; Right upper quadrant abdominal pain; and Biliary colic on his problem list.    ALLERGIES:  has No Known Allergies.  MEDICATIONS: has a current medication list which includes the following prescription(s): albuterol, esomeprazole, glipizide, insulin glargine, metformin, metoprolol, and quinapril.  SURGICAL HISTORY:  Past Surgical History  Procedure Laterality Date  . Sigmoidoscopy      2001 DR. FLEISCHMAN INTERNAL HERMORRHOIDS  . Upper gastrointestinal endoscopy  DEC 2010    BENIGN POLYPS, GASTRITIS, ?phg  . Knee surgery  RIGHT  . Hemorrhoid surgery    . Esophageal biopsy  09/08/2011    UEA:[id number] gastritis/Polyps, multiple in the body of the stomach    FAMILY HISTORY: family history includes Cancer in his father. There is no history of Colon cancer, Anesthesia problems, Hypotension, Malignant hyperthermia, or Pseudochol deficiency.  SOCIAL HISTORY:  reports that he has never smoked. He has never used smokeless tobacco. He reports that he uses illicit drugs (Marijuana and  Cocaine). He reports that he does not drink alcohol.  REVIEW OF SYSTEMS:  Other than that discussed above is noncontributory.  PHYSICAL EXAMINATION: ECOG PERFORMANCE STATUS: 1 - Symptomatic but completely ambulatory  Blood pressure 156/54,  pulse 77, temperature 98.3 F (36.8 C), temperature source Oral, resp. rate 20, weight 278 lb 11.2 oz (126.417 kg).  GENERAL:alert, no distress and comfortable SKIN: skin color, texture, turgor are normal, no rashes or significant lesions EYES: PERLA; Conjunctiva are pink and non-injected, sclera clear OROPHARYNX:no exudate, no erythema on lips, buccal mucosa, or tongue. NECK: supple, thyroid normal size, non-tender, without nodularity. No masses CHEST: Increased AP diameter with no gynecomastia. No spider angiomata are noted. LYMPH:  no palpable lymphadenopathy in the cervical, axillary or inguinal LUNGS: clear to auscultation and percussion with normal breathing effort HEART: regular rate & rhythm and no murmurs. ABDOMEN:abdomen soft, non-tender and normal bowel sounds. Obese with tenderness in the right upper quadrant with liver measuring 18 cm in the mid clavicular line. Spleen could not be appreciated. MUSCULOSKELETAL:no cyanosis of digits and no clubbing. Range of motion normal. Chronic stasis dermatitis changes without purulence or erythema. NEURO: alert & oriented x 3 with fluent speech, no focal motor/sensory deficits. Asterixis is present.   LABORATORY DATA: No visits with results within 30 Day(s) from this visit. Latest known visit with results is:  Admission on 04/07/2013, Discharged on 04/08/2013  Component Date Value Range Status  . WBC 04/07/2013 4.1  4.0 - 10.5 K/uL Final  . RBC 04/07/2013 3.58* 4.22 - 5.81 MIL/uL Final  . Hemoglobin 04/07/2013 11.7* 13.0 - 17.0 g/dL Final  . HCT 13/03/6577 34.1* 39.0 - 52.0 % Final  . MCV 04/07/2013 95.3  78.0 - 100.0 fL Final  . MCH 04/07/2013 32.7  26.0 - 34.0 pg Final  . MCHC 04/07/2013 34.3  30.0 - 36.0 g/dL Final  . RDW 46/96/2952 14.1  11.5 - 15.5 % Final  . Platelets 04/07/2013 71* 150 - 400 K/uL Final  . Neutrophils Relative % 04/07/2013 59  43 - 77 % Final  . Lymphocytes Relative 04/07/2013 29  12 - 46 % Final  . Monocytes  Relative 04/07/2013 10  3 - 12 % Final  . Eosinophils Relative 04/07/2013 2  0 - 5 % Final  . Basophils Relative 04/07/2013 0  0 - 1 % Final  . Neutro Abs 04/07/2013 2.4  1.7 - 7.7 K/uL Final  . Lymphs Abs 04/07/2013 1.2  0.7 - 4.0 K/uL Final  . Monocytes Absolute 04/07/2013 0.4  0.1 - 1.0 K/uL Final  . Eosinophils Absolute 04/07/2013 0.1  0.0 - 0.7 K/uL Final  . Basophils Absolute 04/07/2013 0.0  0.0 - 0.1 K/uL Final  . Smear Review 04/07/2013 PLATELETS APPEAR DECREASED   Final   PLATELET COUNT CONFIRMED BY SMEAR  . Sodium 04/07/2013 138  135 - 145 mEq/L Final  . Potassium 04/07/2013 3.8  3.5 - 5.1 mEq/L Final  . Chloride 04/07/2013 107  96 - 112 mEq/L Final  . CO2 04/07/2013 22  19 - 32 mEq/L Final  . Glucose, Bld 04/07/2013 182* 70 - 99 mg/dL Final  . BUN 84/13/2440 16  6 - 23 mg/dL Final  . Creatinine, Ser 04/07/2013 0.66  0.50 - 1.35 mg/dL Final  . Calcium 06/07/2535 8.6  8.4 - 10.5 mg/dL Final  . Total Protein 04/07/2013 7.0  6.0 - 8.3 g/dL Final  . Albumin 64/40/3474 3.0* 3.5 - 5.2 g/dL Final  . AST 25/95/6387 212* 0 - 37 U/L Final  .  ALT 04/07/2013 120* 0 - 53 U/L Final  . Alkaline Phosphatase 04/07/2013 114  39 - 117 U/L Final  . Total Bilirubin 04/07/2013 1.4* 0.3 - 1.2 mg/dL Final  . GFR calc non Af Amer 04/07/2013 >90  >90 mL/min Final  . GFR calc Af Amer 04/07/2013 >90  >90 mL/min Final   Comment: (NOTE)                          The eGFR has been calculated using the CKD EPI equation.                          This calculation has not been validated in all clinical situations.                          eGFR's persistently <90 mL/min signify possible Chronic Kidney                          Disease.  . Lipase 04/07/2013 126* 11 - 59 U/L Final  . Color, Urine 04/07/2013 BROWN* YELLOW Final   BIOCHEMICALS MAY BE AFFECTED BY COLOR  . APPearance 04/07/2013 CLOUDY* CLEAR Final  . Specific Gravity, Urine 04/07/2013 >1.030* 1.005 - 1.030 Final  . pH 04/07/2013 6.5  5.0 - 8.0  Final  . Glucose, UA 04/07/2013 100* NEGATIVE mg/dL Final  . Hgb urine dipstick 04/07/2013 LARGE* NEGATIVE Final  . Bilirubin Urine 04/07/2013 NEGATIVE  NEGATIVE Final  . Ketones, ur 04/07/2013 TRACE* NEGATIVE mg/dL Final  . Protein, ur 78/29/5621 >300* NEGATIVE mg/dL Final  . Urobilinogen, UA 04/07/2013 4.0* 0.0 - 1.0 mg/dL Final  . Nitrite 30/86/5784 POSITIVE* NEGATIVE Final  . Leukocytes, UA 04/07/2013 NEGATIVE  NEGATIVE Final  . Ammonia 04/07/2013 90* 11 - 60 umol/L Final  . Squamous Epithelial / LPF 04/07/2013 RARE  RARE Final  . WBC, UA 04/07/2013 3-6  <3 WBC/hpf Final  . RBC / HPF 04/07/2013 TOO NUMEROUS TO COUNT  <3 RBC/hpf Final  . Bacteria, UA 04/07/2013 MANY* RARE Final  . Specimen Description 04/07/2013 URINE, CLEAN CATCH   Final  . Special Requests 04/07/2013 NONE   Final  . Culture  Setup Time 04/07/2013    Final                   Value:04/08/2013 15:18                         Performed at [facility]  . Colony Count 04/07/2013    Final                   Value:5,000 COLONIES/ML                         Performed at [facility]  . Culture 04/07/2013    Final                   Value:INSIGNIFICANT GROWTH                         Performed at [facility]  . Report Status 04/07/2013 04/09/2013 FINAL   Final    PATHOLOGY: None.  Urinalysis    Component Value Date/Time   COLORURINE BROWN* 04/07/2013 2323   APPEARANCEUR CLOUDY* 04/07/2013 2323  LABSPEC >1.030* 04/07/2013 2323   PHURINE 6.5 04/07/2013 2323   GLUCOSEU 100* 04/07/2013 2323   HGBUR LARGE* 04/07/2013 2323   BILIRUBINUR NEGATIVE 04/07/2013 2323   KETONESUR TRACE* 04/07/2013 2323   PROTEINUR >300* 04/07/2013 2323   UROBILINOGEN 4.0* 04/07/2013 2323   NITRITE POSITIVE* 04/07/2013 2323   LEUKOCYTESUR NEGATIVE 04/07/2013 2323    RADIOGRAPHIC STUDIES:   US Abdomen Complete Status: Final result         PACS Images    Show images for US Abdomen Complete         Study Result     *RADIOLOGY REPORT*  Clinical Data: Right upper quadrant abdominal pain. History of  cirrhosis/hepatitis C with diabetes, hypertension and  cholelithiasis.  COMPLETE ABDOMINAL ULTRASOUND  Comparison: Abdominal ultrasound 10/03/2011. Abdominal CT  03/20/2013.  Findings:  Study is mildly degraded by bowel gas and body habitus.  Gallbladder: Previously demonstrated gallstone is not clearly seen  today. There is a small echogenic nonshadowing focus along the  gallbladder which may reflect a small polyp. There is no  gallbladder wall thickening or sonographic Murphy's sign.  Common bile duct: Normal in caliber without filling defects.  Liver: The hepatic contours are diffusely irregular consistent  with cirrhosis. The liver is enlarged with heterogeneous  echotexture. No focal lesions are identified.  IVC: Visualized portions appear unremarkable.  Pancreas: Poorly visualized due to overlying bowel gas.  Spleen: The spleen is enlarged, measuring 23.3 x 13.2 x 24.4 cm  for an estimated volume of 3916 ml. No focal lesions are  identified.  Right Kidney: The renal cortical thickness and echogenicity are  preserved. There is no hydronephrosis or focal abnormality. Renal  length is 12.5 cm.  Left Kidney: The renal cortical thickness and echogenicity are  preserved. There is no hydronephrosis or focal abnormality. Renal  length is 13.7 cm.  Abdominal aorta: The visualized portions appear unremarkable. The  distal aorta is obscured by bowel gas.  IMPRESSION:  1. Grossly stable changes of cirrhosis with portal hypertension.  There is moderate splenomegaly.  2. Small gallbladder polyp. No gallstones or gallbladder wall  thickening demonstrated.  3. No acute findings seen.  Original Report Authenticated By: Chrissie Noa     CT Abdomen w/o contrast Status: Final result         PACS Images    Show images for CT Abdomen Pelvis Wo Contrast         Study Result    *RADIOLOGY REPORT*    Clinical Data: Urinary retention, hematuria, right flank pain  CT ABDOMEN AND PELVIS WITHOUT CONTRAST  Technique: Multidetector CT imaging of the abdomen and pelvis was  performed following the standard protocol without intravenous  contrast.  Comparison: 03/20/2013, 03/12/2010  Findings: Right middle lobe peripheral cystic change and or bulla  with associated punctate calcifications, presumably a post  infectious or posttraumatic component, similar to priors. Normal  heart size.  Organ abnormality/lesion detection is limited in the absence of  intravenous contrast. Within this limitation, cirrhotic liver  morphology. Dependent gallstones. Mild gallbladder wall  thickening. No biliary ductal dilatation.  Splenomegaly. Limited pancreatic characterization without  appreciable ductal dilatation. Unremarkable adrenal glands.  Bilateral nonobstructing renal stones. No hydroureteronephrosis.  No CT evidence for colitis. Normal appendix. Small bowel loops  are normal course and caliber.  Trace free fluid. Mesenteric root fat stranding. Prominent porta  hepatis and gastrohepatic lymph nodes. 1.1 cm short axis lymph  node anterior to the stomach on image 39 series 2. No free  intraperitoneal air.  Normal caliber aorta and branch vessels. Prominent  mesenteric/perisplenic collaterals.  Thin-walled bladder. Nonspecific prostatic calcifications.  Prominent gonadal vein/varices extend into the left inguinal canal,  incompletely imaged.  Multilevel degenerative changes of the imaged spine. No acute or  aggressive appearing osseous lesion.  IMPRESSION:  Nonobstructing renal stones. No hydroureteronephrosis or ureteral  calculi.  Cirrhotic liver morphology with stigmata of portal hypertension  including splenomegaly, trace ascites, and varices as above.  Recommend follow-up liver MRI for HCC screening.  Gallstone and mild gallbladder wall thickening, nonspecific in the  setting of hepatitis.  Correlate with LFTs and symptoms if  concerned for acute biliary pathology.  Mesenteric root fat stranding may reflect third spacing,  mesenteritis, or reactive change from adjacent organs such as  pancreatitis or enteritis. Correlate with lipase.  Prominent porta hepatis and mesenteric lymph nodes warrant  continued close attention on follow-up.  Original Report Authenticated By: Greig Castilla       ASSESSMENT:  #1. Pancytopenia secondary to hypersplenism due to cirrhosis of the liver secondary to hepatitis C infection, having been treated with ribavirin in the past and currently waiting for new oral agent (Harvoni). #2. Increasing right upper quadrant pain, rule out transformation into hepatocellular carcinoma. #3. Iron deficiency, awaiting today's CBC and ferritin.  PLAN:  #1. Abdominal ultrasound to determine whether or not forming in the liver. #2. If ferritin is low, the ranges were made for intravenous iron. #3. Followup in 3 months with lab tests.   All questions were answered. The patient knows to call the clinic with any problems, questions or concerns. We can certainly see the patient much sooner if necessary.   I spent 25 minutes counseling the patient face to face. The total time spent in the appointment was 30 minutes.    Maurilio Lovely, MD 06/29/2013 4:22 PM

## 2013-06-30 LAB — AFP TUMOR MARKER: AFP-Tumor Marker: 17.8 ng/mL — ABNORMAL HIGH (ref 0.0–8.0)

## 2013-06-30 LAB — FERRITIN: Ferritin: 97 ng/mL (ref 22–322)

## 2013-06-30 LAB — IRON AND TIBC
Saturation Ratios: 26 % (ref 20–55)
UIBC: 225 ug/dL (ref 125–400)

## 2013-07-01 ENCOUNTER — Other Ambulatory Visit (HOSPITAL_COMMUNITY): Payer: Self-pay

## 2013-07-05 ENCOUNTER — Telehealth (HOSPITAL_COMMUNITY): Payer: Self-pay

## 2013-07-05 ENCOUNTER — Ambulatory Visit (HOSPITAL_COMMUNITY)
Admission: RE | Admit: 2013-07-05 | Discharge: 2013-07-05 | Disposition: A | Discharge status: Home / Self Care | Payer: Medicaid Other | Source: Ambulatory Visit | Attending: Hematology and Oncology | Admitting: Hematology and Oncology

## 2013-07-05 DIAGNOSIS — D731 Hypersplenism: Secondary | ICD-10-CM

## 2013-07-05 DIAGNOSIS — R1011 Right upper quadrant pain: Secondary | ICD-10-CM | POA: Insufficient documentation

## 2013-07-05 DIAGNOSIS — K739 Chronic hepatitis, unspecified: Secondary | ICD-10-CM | POA: Insufficient documentation

## 2013-07-05 DIAGNOSIS — B182 Chronic viral hepatitis C: Secondary | ICD-10-CM

## 2013-07-05 DIAGNOSIS — I868 Varicose veins of other specified sites: Secondary | ICD-10-CM | POA: Insufficient documentation

## 2013-07-05 DIAGNOSIS — K7460 Unspecified cirrhosis of liver: Secondary | ICD-10-CM | POA: Insufficient documentation

## 2013-07-05 DIAGNOSIS — R188 Other ascites: Secondary | ICD-10-CM | POA: Insufficient documentation

## 2013-07-05 DIAGNOSIS — R161 Splenomegaly, not elsewhere classified: Secondary | ICD-10-CM

## 2013-07-05 DIAGNOSIS — K219 Gastro-esophageal reflux disease without esophagitis: Secondary | ICD-10-CM | POA: Insufficient documentation

## 2013-07-05 DIAGNOSIS — K8020 Calculus of gallbladder without cholecystitis without obstruction: Secondary | ICD-10-CM | POA: Insufficient documentation

## 2013-07-05 NOTE — Telephone Encounter (Signed)
Message copied by Evelena Leyden on Tue Jul 05, 2013  4:46 PM ------      Message from: Alla German A      Created: Tue Jul 05, 2013  1:51 PM       Please call [name] and tell him he has gallstones and that may be why he has pain on the right side of his abdomen. If he wishes, we'll refer to Dr. Malvin Johns for possible laparoscopic cholecystectomy ------

## 2013-07-05 NOTE — Telephone Encounter (Signed)
Information regarding gallstones relayed to patient and patient stated that he was seen in [place] 2 years ago and they told him then that he had gallstones but they would not recommend surgery because of his hepatitis and great potential for bleeding.  Is willing to see a surgeon but wants to see someone who specializes more with GI problems.

## 2013-07-06 ENCOUNTER — Other Ambulatory Visit (HOSPITAL_COMMUNITY): Payer: Self-pay | Admitting: Hematology and Oncology

## 2013-07-06 ENCOUNTER — Telehealth (HOSPITAL_COMMUNITY): Payer: Self-pay

## 2013-07-06 ENCOUNTER — Encounter (HOSPITAL_COMMUNITY): Payer: Self-pay

## 2013-07-06 MED ORDER — OXYCODONE HCL 5 MG PO TABS: 10.0000 mg | ORAL_TABLET | ORAL | Status: DC | PRN

## 2013-07-06 NOTE — Telephone Encounter (Signed)
Patient wants to know if MD will prescribe anything for pain.  States " My right side is hurting and I was up all night last night because it was hurting so bad."  Rates pain at level 9.

## 2013-07-22 ENCOUNTER — Emergency Department (HOSPITAL_COMMUNITY): Payer: Medicaid Other

## 2013-07-22 ENCOUNTER — Encounter (HOSPITAL_COMMUNITY): Payer: Self-pay | Admitting: Emergency Medicine

## 2013-07-22 ENCOUNTER — Inpatient Hospital Stay (HOSPITAL_COMMUNITY)
Admission: EM | Admit: 2013-07-22 | Discharge: 2013-07-25 | DRG: 445 | Disposition: A | Discharge status: Home / Self Care | Payer: Medicaid Other | Attending: Internal Medicine | Admitting: Internal Medicine

## 2013-07-22 DIAGNOSIS — Z794 Long term (current) use of insulin: Secondary | ICD-10-CM

## 2013-07-22 DIAGNOSIS — F1011 Alcohol abuse, in remission: Secondary | ICD-10-CM | POA: Diagnosis present

## 2013-07-22 DIAGNOSIS — F1210 Cannabis abuse, uncomplicated: Secondary | ICD-10-CM | POA: Diagnosis present

## 2013-07-22 DIAGNOSIS — I10 Essential (primary) hypertension: Secondary | ICD-10-CM | POA: Diagnosis present

## 2013-07-22 DIAGNOSIS — E8809 Other disorders of plasma-protein metabolism, not elsewhere classified: Secondary | ICD-10-CM | POA: Diagnosis present

## 2013-07-22 DIAGNOSIS — K8020 Calculus of gallbladder without cholecystitis without obstruction: Secondary | ICD-10-CM

## 2013-07-22 DIAGNOSIS — E119 Type 2 diabetes mellitus without complications: Secondary | ICD-10-CM | POA: Diagnosis present

## 2013-07-22 DIAGNOSIS — R161 Splenomegaly, not elsewhere classified: Secondary | ICD-10-CM

## 2013-07-22 DIAGNOSIS — E669 Obesity, unspecified: Secondary | ICD-10-CM | POA: Diagnosis present

## 2013-07-22 DIAGNOSIS — K8042 Calculus of bile duct with acute cholecystitis without obstruction: Secondary | ICD-10-CM

## 2013-07-22 DIAGNOSIS — K8000 Calculus of gallbladder with acute cholecystitis without obstruction: Secondary | ICD-10-CM | POA: Diagnosis present

## 2013-07-22 DIAGNOSIS — F1410 Cocaine abuse, uncomplicated: Secondary | ICD-10-CM | POA: Diagnosis present

## 2013-07-22 DIAGNOSIS — J449 Chronic obstructive pulmonary disease, unspecified: Secondary | ICD-10-CM | POA: Diagnosis present

## 2013-07-22 DIAGNOSIS — D731 Hypersplenism: Secondary | ICD-10-CM | POA: Diagnosis present

## 2013-07-22 DIAGNOSIS — F1010 Alcohol abuse, uncomplicated: Secondary | ICD-10-CM | POA: Diagnosis present

## 2013-07-22 DIAGNOSIS — R1011 Right upper quadrant pain: Secondary | ICD-10-CM

## 2013-07-22 DIAGNOSIS — Z6841 Body Mass Index (BMI) 40.0 and over, adult: Secondary | ICD-10-CM

## 2013-07-22 DIAGNOSIS — K766 Portal hypertension: Secondary | ICD-10-CM | POA: Diagnosis present

## 2013-07-22 DIAGNOSIS — K7030 Alcoholic cirrhosis of liver without ascites: Secondary | ICD-10-CM | POA: Diagnosis present

## 2013-07-22 DIAGNOSIS — R109 Unspecified abdominal pain: Secondary | ICD-10-CM

## 2013-07-22 DIAGNOSIS — K869 Disease of pancreas, unspecified: Secondary | ICD-10-CM

## 2013-07-22 DIAGNOSIS — K8050 Calculus of bile duct without cholangitis or cholecystitis without obstruction: Secondary | ICD-10-CM

## 2013-07-22 DIAGNOSIS — Z1211 Encounter for screening for malignant neoplasm of colon: Secondary | ICD-10-CM

## 2013-07-22 DIAGNOSIS — K219 Gastro-esophageal reflux disease without esophagitis: Secondary | ICD-10-CM | POA: Diagnosis present

## 2013-07-22 DIAGNOSIS — K7460 Unspecified cirrhosis of liver: Secondary | ICD-10-CM

## 2013-07-22 DIAGNOSIS — J4489 Other specified chronic obstructive pulmonary disease: Secondary | ICD-10-CM | POA: Diagnosis present

## 2013-07-22 DIAGNOSIS — Z862 Personal history of diseases of the blood and blood-forming organs and certain disorders involving the immune mechanism: Secondary | ICD-10-CM

## 2013-07-22 DIAGNOSIS — D509 Iron deficiency anemia, unspecified: Secondary | ICD-10-CM

## 2013-07-22 DIAGNOSIS — Z23 Encounter for immunization: Secondary | ICD-10-CM

## 2013-07-22 DIAGNOSIS — N390 Urinary tract infection, site not specified: Secondary | ICD-10-CM | POA: Diagnosis present

## 2013-07-22 DIAGNOSIS — B182 Chronic viral hepatitis C: Secondary | ICD-10-CM | POA: Diagnosis present

## 2013-07-22 DIAGNOSIS — Z79899 Other long term (current) drug therapy: Secondary | ICD-10-CM

## 2013-07-22 DIAGNOSIS — R5381 Other malaise: Secondary | ICD-10-CM

## 2013-07-22 DIAGNOSIS — D61818 Other pancytopenia: Secondary | ICD-10-CM

## 2013-07-22 LAB — COMPREHENSIVE METABOLIC PANEL
AST: 210 U/L — ABNORMAL HIGH (ref 0–37)
Albumin: 2.9 g/dL — ABNORMAL LOW (ref 3.5–5.2)
Alkaline Phosphatase: 136 U/L — ABNORMAL HIGH (ref 39–117)
Chloride: 106 mEq/L (ref 96–112)
Creatinine, Ser: 0.81 mg/dL (ref 0.50–1.35)
Potassium: 3.8 mEq/L (ref 3.5–5.1)
Total Bilirubin: 2.2 mg/dL — ABNORMAL HIGH (ref 0.3–1.2)
Total Protein: 7.4 g/dL (ref 6.0–8.3)

## 2013-07-22 LAB — CBC WITH DIFFERENTIAL/PLATELET
Basophils Absolute: 0 10*3/uL (ref 0.0–0.1)
Eosinophils Relative: 2 % (ref 0–5)
Lymphocytes Relative: 20 % (ref 12–46)
MCHC: 34.4 g/dL (ref 30.0–36.0)
MCV: 94.9 fL (ref 78.0–100.0)
Neutro Abs: 2.7 10*3/uL (ref 1.7–7.7)
Platelets: 69 10*3/uL — ABNORMAL LOW (ref 150–400)
RBC: 3.52 MIL/uL — ABNORMAL LOW (ref 4.22–5.81)
RDW: 13.9 % (ref 11.5–15.5)
WBC: 4.2 10*3/uL (ref 4.0–10.5)

## 2013-07-22 LAB — URINALYSIS, ROUTINE W REFLEX MICROSCOPIC
Bilirubin Urine: NEGATIVE
Glucose, UA: NEGATIVE mg/dL
Leukocytes, UA: NEGATIVE
Nitrite: NEGATIVE
Specific Gravity, Urine: >1.03 — ABNORMAL HIGH (ref 1.005–1.030)
pH: 6 (ref 5.0–8.0)

## 2013-07-22 LAB — URINE MICROSCOPIC-ADD ON

## 2013-07-22 LAB — TROPONIN I: Troponin I: <0.3 ng/mL (ref <0.30)

## 2013-07-22 MED ORDER — PIPERACILLIN-TAZOBACTAM 3.375 G IVPB
3.3750 g | Freq: Once | INTRAVENOUS | Status: AC
  Administered 2013-07-22: 3.375 g via INTRAVENOUS
  Filled 2013-07-22: qty 50

## 2013-07-22 MED ORDER — IOHEXOL 300 MG/ML  SOLN
50.0000 mL | Freq: Once | INTRAMUSCULAR | Status: AC | PRN
  Administered 2013-07-22: 50 mL via ORAL

## 2013-07-22 MED ORDER — HYDROMORPHONE HCL PF 1 MG/ML IJ SOLN
INTRAMUSCULAR | Status: AC
  Administered 2013-07-22: 1 mg via INTRAVENOUS
  Filled 2013-07-22: qty 1

## 2013-07-22 MED ORDER — ONDANSETRON HCL 4 MG/2ML IJ SOLN
4.0000 mg | Freq: Once | INTRAMUSCULAR | Status: AC
Start: 2013-07-22 — End: 2013-07-22
  Administered 2013-07-22: 4 mg via INTRAVENOUS
  Filled 2013-07-22: qty 2

## 2013-07-22 MED ORDER — DIPHENHYDRAMINE HCL 50 MG/ML IJ SOLN
INTRAMUSCULAR | Status: AC
  Filled 2013-07-22: qty 1

## 2013-07-22 MED ORDER — DIPHENHYDRAMINE HCL 50 MG/ML IJ SOLN
25.0000 mg | Freq: Once | INTRAMUSCULAR | Status: AC
  Administered 2013-07-22: 25 mg via INTRAVENOUS

## 2013-07-22 MED ORDER — ONDANSETRON HCL 4 MG/2ML IJ SOLN
4.0000 mg | Freq: Once | INTRAMUSCULAR | Status: AC
  Administered 2013-07-22: 4 mg via INTRAVENOUS
  Filled 2013-07-22: qty 2

## 2013-07-22 MED ORDER — HYDROMORPHONE HCL PF 1 MG/ML IJ SOLN
1.0000 mg | INTRAMUSCULAR | Status: DC | PRN
  Administered 2013-07-22 – 2013-07-25 (×14): 1 mg via INTRAVENOUS
  Filled 2013-07-22 (×13): qty 1

## 2013-07-22 MED ORDER — HYDROMORPHONE HCL PF 1 MG/ML IJ SOLN
1.0000 mg | Freq: Once | INTRAMUSCULAR | Status: AC
  Administered 2013-07-22: 1 mg via INTRAVENOUS
  Filled 2013-07-22: qty 1

## 2013-07-22 MED ORDER — IOHEXOL 300 MG/ML  SOLN
100.0000 mL | Freq: Once | INTRAMUSCULAR | Status: AC | PRN
  Administered 2013-07-22: 100 mL via INTRAVENOUS

## 2013-07-22 MED ORDER — MORPHINE SULFATE 4 MG/ML IJ SOLN
4.0000 mg | Freq: Once | INTRAMUSCULAR | Status: AC
  Administered 2013-07-22: 4 mg via INTRAVENOUS
  Filled 2013-07-22: qty 1

## 2013-07-22 NOTE — ED Notes (Signed)
Report called to April on unit 5W at [facility].

## 2013-07-22 NOTE — ED Notes (Addendum)
abd  Pain,  Chest pain when coughs, vomiting, diarrhea.  Feels sob..  Feels weak.  Headache   Recently dx with gall stones

## 2013-07-22 NOTE — ED Provider Notes (Signed)
CSN: [id number]     Arrival date & time 07/22/13  1732 History   First MD Initiated Contact with Patient 07/22/13 1818     Chief Complaint  Patient presents with  . Abdominal Pain   (Consider location/radiation/quality/duration/timing/severity/associated sxs/prior Treatment) HPI Comments: One month history of right upper quadrant abdominal pain is getting worse. Associated with nausea and vomiting over the past several days. Patient was seen in November and diagnosed with gallstones and followup with Dr. Malvin Johns who thought he was too high risk for surgery. Patient has a history of cirrhosis, hypertension, diabetes, hepatitis C and COPD. He endorses some chest pain with coughing only he feels that his breathing is worse because of his abdominal pain. Denies any fever. Denies any dysuria or hematuria.  The history is provided by the patient.    Past Medical History  Diagnosis Date  . Hypertension   . Diabetes mellitus   . GERD (gastroesophageal reflux disease)     DEC 2010 EGD/Bx REACTIVE GASTROPATHY, NO VARICES  . Hemorrhoids, internal   . BMI 40.0-44.9, adult OCT 2010 269 LBS    APR 2012 279 LBS AUG 2014 185 LBS  . 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 IV drug abuse REMOTE  . Hepatitis 2010 HEP C    AST 509 ALT 267 ALK PHOS 165 ALB 3.8 NEG IGM HAV/HBSAg  . Gallstone AUG 2012 1 CM  . GERD 10/25/2009  . COPD (chronic obstructive pulmonary disease)   . Hepatitis C   . Pancytopenia 2013  . Splenomegaly 2013  . Other pancytopenia 02/18/2013  . Iron (Fe) deficiency anemia 02/18/2013  . Splenomegaly 02/18/2013   Past Surgical History  Procedure Laterality Date  . Sigmoidoscopy      2001 DR. FLEISCHMAN INTERNAL HERMORRHOIDS  . Upper gastrointestinal endoscopy  DEC 2010    BENIGN POLYPS, GASTRITIS, ?phg  . Knee surgery  RIGHT  . Hemorrhoid surgery    . Esophageal biopsy  09/08/2011    WUJ:[id number] gastritis/Polyps, multiple in the body of the stomach   Family  History  Problem Relation Age of Onset  . Colon cancer Neg Hx   . Anesthesia problems Neg Hx   . Hypotension Neg Hx   . Malignant hyperthermia Neg Hx   . Pseudochol deficiency Neg Hx   . Cancer Father    History  Substance Use Topics  . Smoking status: Never Smoker   . Smokeless tobacco: Never Used  . Alcohol Use: No     Comment: 30 years ago    Review of Systems  Constitutional: Positive for activity change, appetite change and fatigue. Negative for fever.  Respiratory: Positive for shortness of breath. Negative for cough and chest tightness.   Cardiovascular: Positive for chest pain.  Gastrointestinal: Positive for nausea, vomiting and abdominal pain.  Genitourinary: Negative for dysuria and hematuria.  Musculoskeletal: Positive for arthralgias, back pain and myalgias.  Skin: Negative for rash.  Neurological: Positive for weakness. Negative for dizziness, light-headedness and headaches.  A complete 10 system review of systems was obtained and all systems are negative except as noted in the HPI and PMH.    Allergies  Review of patient's allergies indicates no known allergies.  Home Medications   Current Outpatient Rx  Name  Route  Sig  Dispense  Refill  . albuterol (PROVENTIL HFA;VENTOLIN HFA) 108 (90 BASE) MCG/ACT inhaler   Inhalation   Inhale 2 puffs into the lungs every 6 (six) hours as needed. Shortness of  breath         . esomeprazole (NEXIUM) 40 MG capsule   Oral   Take 40 mg by mouth 2 (two) times daily.         Marland Kitchen glipiZIDE (GLUCOTROL) 10 MG tablet   Oral   Take 10 mg by mouth 2 (two) times daily before a meal.           . insulin glargine (LANTUS) 100 UNIT/ML injection   Subcutaneous   Inject 10 Units into the skin at bedtime.         Marland Kitchen lisinopril (PRINIVIL,ZESTRIL) 40 MG tablet   Oral   Take 40 mg by mouth daily.         . metFORMIN (GLUCOPHAGE) 1000 MG tablet   Oral   Take 1,000 mg by mouth 2 (two) times daily with a meal.         .  oxyCODONE (OXY IR/ROXICODONE) 5 MG immediate release tablet   Oral   Take 2 tablets (10 mg total) by mouth every 4 (four) hours as needed for severe pain.   60 tablet   0    BP 128/56  Pulse 55  Temp(Src) 98 F (36.7 C) (Oral)  Resp 14  Ht 5\' 6"  (1.676 m)  Wt 278 lb (126.1 kg)  BMI 44.89 kg/m2  SpO2 96% Physical Exam  Constitutional: He is oriented to person, place, and time. He appears well-developed and well-nourished. No distress.  HENT:  Head: Normocephalic and atraumatic.  Mouth/Throat: Oropharynx is clear and moist. No oropharyngeal exudate.  Eyes: Conjunctivae and EOM are normal. Pupils are equal, round, and reactive to light.  Neck: Normal range of motion. Neck supple.  Cardiovascular: Normal rate, regular rhythm and normal heart sounds.   Pulmonary/Chest: Effort normal and breath sounds normal. No respiratory distress. He has no wheezes.  Abdominal: Soft. There is tenderness. There is guarding. There is no rebound.  Obese, TTP RUQ with guarding  Musculoskeletal: Normal range of motion. He exhibits no edema and no tenderness.  Neurological: He is alert and oriented to person, place, and time. No cranial nerve deficit. He exhibits normal muscle tone. Coordination normal.  Skin: Skin is warm.    ED Course  Procedures (including critical care time) Labs Review Labs Reviewed  CBC WITH DIFFERENTIAL - Abnormal; Notable for the following:    RBC 3.52 (*)    Hemoglobin 11.5 (*)    HCT 33.4 (*)    Platelets 69 (*)    Monocytes Relative 14 (*)    All other components within normal limits  COMPREHENSIVE METABOLIC PANEL - Abnormal; Notable for the following:    Albumin 2.9 (*)    AST 210 (*)    ALT 100 (*)    Alkaline Phosphatase 136 (*)    Total Bilirubin 2.2 (*)    All other components within normal limits  LIPASE, BLOOD - Abnormal; Notable for the following:    Lipase 96 (*)    All other components within normal limits  URINALYSIS, ROUTINE W REFLEX MICROSCOPIC -  Abnormal; Notable for the following:    Color, Urine ORANGE (*)    Specific Gravity, Urine >1.030 (*)    Hgb urine dipstick TRACE (*)    All other components within normal limits  TROPONIN I  URINE MICROSCOPIC-ADD ON   Imaging Review Ct Abdomen Pelvis W Contrast  07/22/2013   CLINICAL DATA:  Abdominal pain. Vomiting. Diarrhea. Cholelithiasis. Cirrhosis.  EXAM: CT ABDOMEN AND PELVIS WITH CONTRAST  TECHNIQUE: Multidetector  CT imaging of the abdomen and pelvis was performed using the standard protocol following bolus administration of intravenous contrast.  CONTRAST:  OMNIPAQUE IOHEXOL 300 MG/ML  SOLN  COMPARISON:  04/07/2013  FINDINGS: Hepatic cirrhosis again demonstrated. No liver masses are identified. Marked splenomegaly again seen, as well as prominent left abdominal and pelvic venous collaterals, consistent with portal venous hypertension. Increased diffuse mesenteric edema and mild ascites is seen since prior study.  Tiny calcified gallstones again noted, however there is no evidence of cholecystitis. The pancreas, adrenal glands, and kidneys are normal in appearance, except for a tiny nonobstructing calculus in the lower pole of the left kidney. No evidence of hydronephrosis.  No soft tissue masses or lymphadenopathy identified. No focal inflammatory process or abscess identified. No evidence of dilated bowel loops or hernia.  IMPRESSION: Increased mild ascites and diffuse mesenteric edema.  Stable appearance of hepatic cirrhosis, marked splenomegaly, and prominent left abdominal and pelvic venous collaterals, consistent with portal venous hypertension. No hepatic neoplasm visualized.  Nonobstructive left nephrolithiasis.  No evidence of hydronephrosis.  Cholelithiasis. No radiographic evidence of cholecystitis.   Electronically Signed   By: Myles Rosenthal M.D.   On: 07/22/2013 21:17   Dg Abd Acute W/chest  07/22/2013   CLINICAL DATA:  Abdominal pain. Chest pain. Cough. Vomiting. Diarrhea.  Dyspnea. Cirrhosis.  EXAM: ACUTE ABDOMEN SERIES (ABDOMEN 2 VIEW & CHEST 1 VIEW)  COMPARISON:  04/07/2013 and 03/22/2013  FINDINGS: There is no evidence of dilated bowel loops or free intraperitoneal air. No radiopaque calculi or other significant radiographic abnormality is seen. Heart size and mediastinal contours are within normal limits. Both lungs are clear.  IMPRESSION: Negative abdominal radiographs.  No acute cardiopulmonary disease.   Electronically Signed   By: Myles Rosenthal M.D.   On: 07/22/2013 19:59    EKG Interpretation    Date/Time:  Friday July 22 2013 18:45:03 EST Ventricular Rate:  78 PR Interval:  140 QRS Duration: 88 QT Interval:  422 QTC Calculation: 481 R Axis:   42 Text Interpretation:  Normal sinus rhythm Prolonged QT Abnormal ECG When compared with ECG of 07-Apr-2013 22:43, No significant change was found No significant change was found Confirmed by Manus Gunning  MD, Rosezetta Balderston ([id number]) on 07/22/2013 7:51:32 PM            MDM   1. Cholelithiasis   2. Abdominal pain   3. Abdominal pain, unspecified site   4. Cholecystitis, acute with cholelithiasis   5. Cirrhosis of liver without mention of alcohol   6. Gallstone    Worsening right upper quadrant pain with nausea and vomiting. Associated with chest pain with coughing only. No fever.  Patient has right upper quadrant pain with guarding. Concern for cholecystitis. Known history of gallstones who is seen in the office by Dr. Malvin Johns in November.  Transaminitis similar to previous his bilirubin 2.2. Lipase 96. Ultrasound unavailable, we'll obtain CT. Discussed with Dr. Malvin Johns who states patient is too high risk for cholecystectomy at [facility].  D/w Dr. Harlon Flor at [facility] who agrees to consult on patient but requests a medical admission.  IV zosyn given. D/w Dr. Onalee Hua who will admit for hospitalists and arrange transfer to hospitalist team at [facility].   Glynn Octave, MD 07/22/13 [phone]

## 2013-07-22 NOTE — H&P (Signed)
PCP:   [name], PA-C   Chief Complaint:  abd pain 4 days  HPI: 52 yo male h/o cirrhosis hepc etoh, comes in with 4 days of ruq abd pain worsening esp after eating.  With n/v several times.  Has had problems with his gallbladder for months now, told he needs to have it removed, however gen surg here at [facility] recommended he go to [facility] or [facility] because he was too high risk.  Pt has no health insurance.  This time, the pain started 4 days ago was gradual at first but now persistent and intolerable.  No diarrhea.    Review of Systems:  Positive and negative as per HPI otherwise all other systems are negative  Past Medical History: Past Medical History  Diagnosis Date  . Hypertension   . Diabetes mellitus   . GERD (gastroesophageal reflux disease)     DEC 2010 EGD/Bx REACTIVE GASTROPATHY, NO VARICES  . Hemorrhoids, internal   . BMI 40.0-44.9, adult OCT 2010 269 LBS    APR 2012 279 LBS AUG 2014 185 LBS  . 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 IV drug abuse REMOTE  . Hepatitis 2010 HEP C    AST 509 ALT 267 ALK PHOS 165 ALB 3.8 NEG IGM HAV/HBSAg  . Gallstone AUG 2012 1 CM  . GERD 10/25/2009  . COPD (chronic obstructive pulmonary disease)   . Hepatitis C   . Pancytopenia 2013  . Splenomegaly 2013  . Other pancytopenia 02/18/2013  . Iron (Fe) deficiency anemia 02/18/2013  . Splenomegaly 02/18/2013   Past Surgical History  Procedure Laterality Date  . Sigmoidoscopy      2001 DR. FLEISCHMAN INTERNAL HERMORRHOIDS  . Upper gastrointestinal endoscopy  DEC 2010    BENIGN POLYPS, GASTRITIS, ?phg  . Knee surgery  RIGHT  . Hemorrhoid surgery    . Esophageal biopsy  09/08/2011    [id number] gastritis/Polyps, multiple in the body of the stomach    Medications: Prior to Admission medications   Medication Sig Start Date End Date Taking? Authorizing Provider  albuterol (PROVENTIL HFA;VENTOLIN HFA) 108 (90 BASE) MCG/ACT inhaler Inhale 2 puffs into the lungs  every 6 (six) hours as needed. Shortness of breath   Yes Historical Provider, MD  esomeprazole (NEXIUM) 40 MG capsule Take 40 mg by mouth 2 (two) times daily.   Yes Historical Provider, MD  glipiZIDE (GLUCOTROL) 10 MG tablet Take 10 mg by mouth 2 (two) times daily before a meal.     Yes Historical Provider, MD  insulin glargine (LANTUS) 100 UNIT/ML injection Inject 10 Units into the skin at bedtime.   Yes Historical Provider, MD  lisinopril (PRINIVIL,ZESTRIL) 40 MG tablet Take 40 mg by mouth daily.   Yes Historical Provider, MD  metFORMIN (GLUCOPHAGE) 1000 MG tablet Take 1,000 mg by mouth 2 (two) times daily with a meal.   Yes Historical Provider, MD  oxyCODONE (OXY IR/ROXICODONE) 5 MG immediate release tablet Take 2 tablets (10 mg total) by mouth every 4 (four) hours as needed for severe pain. 07/06/13   Alla German, MD    Allergies:  No Known Allergies  Social History:  reports that he has never smoked. He has never used smokeless tobacco. He reports that he uses illicit drugs (Marijuana and Cocaine). He reports that he does not drink alcohol.  Family History: Family History  Problem Relation Age of Onset  . Colon cancer Neg Hx   . Anesthesia problems Neg Hx   .  Hypotension Neg Hx   . Malignant hyperthermia Neg Hx   . Pseudochol deficiency Neg Hx   . Cancer Father     Physical Exam: Filed Vitals:   07/22/13 1748 07/22/13 2105  BP: 166/59 157/64  Pulse: 88 88  Temp: 99.1 F (37.3 C) 98 F (36.7 C)  TempSrc: Oral Oral  Resp: 20 20  Height: 5\' 6"  (1.676 m)   Weight: 126.1 kg (278 lb)   SpO2: 100% 97%   General appearance: alert, cooperative and mild distress Head: Normocephalic, without obvious abnormality, atraumatic Eyes: negative Nose: Nares normal. Septum midline. Mucosa normal. No drainage or sinus tenderness. Neck: no JVD and supple, symmetrical, trachea midline Lungs: clear to auscultation bilaterally Heart: regular rate and rhythm, S1, S2 normal, no murmur,  click, rub or gallop Abdomen: abnormal findings:  distended and guarding good bs.  ttp ruq no rebound Extremities: extremities normal, atraumatic, no cyanosis or edema Pulses: 2+ and symmetric Skin: Skin color, texture, turgor normal. No rashes or lesions Neurologic: Grossly normal    Labs on Admission:   Recent Labs  07/22/13 1837  NA 137  K 3.8  CL 106  CO2 21  GLUCOSE 82  BUN 12  CREATININE 0.81  CALCIUM 8.9    Recent Labs  07/22/13 1837  AST 210*  ALT 100*  ALKPHOS 136*  BILITOT 2.2*  PROT 7.4  ALBUMIN 2.9*    Recent Labs  07/22/13 1837  LIPASE 96*    Recent Labs  07/22/13 1837  WBC 4.2  NEUTROABS 2.7  HGB 11.5*  HCT 33.4*  MCV 94.9  PLT 69*    Recent Labs  07/22/13 1837  TROPONINI <0.30    Radiological Exams on Admission: Ct Abdomen Pelvis W Contrast  07/22/2013   CLINICAL DATA:  Abdominal pain. Vomiting. Diarrhea. Cholelithiasis. Cirrhosis.  EXAM: CT ABDOMEN AND PELVIS WITH CONTRAST  TECHNIQUE: Multidetector CT imaging of the abdomen and pelvis was performed using the standard protocol following bolus administration of intravenous contrast.  CONTRAST:  OMNIPAQUE IOHEXOL 300 MG/ML  SOLN  COMPARISON:  04/07/2013  FINDINGS: Hepatic cirrhosis again demonstrated. No liver masses are identified. Marked splenomegaly again seen, as well as prominent left abdominal and pelvic venous collaterals, consistent with portal venous hypertension. Increased diffuse mesenteric edema and mild ascites is seen since prior study.  Tiny calcified gallstones again noted, however there is no evidence of cholecystitis. The pancreas, adrenal glands, and kidneys are normal in appearance, except for a tiny nonobstructing calculus in the lower pole of the left kidney. No evidence of hydronephrosis.  No soft tissue masses or lymphadenopathy identified. No focal inflammatory process or abscess identified. No evidence of dilated bowel loops or hernia.  IMPRESSION: Increased  mild ascites and diffuse mesenteric edema.  Stable appearance of hepatic cirrhosis, marked splenomegaly, and prominent left abdominal and pelvic venous collaterals, consistent with portal venous hypertension. No hepatic neoplasm visualized.  Nonobstructive left nephrolithiasis.  No evidence of hydronephrosis.  Cholelithiasis. No radiographic evidence of cholecystitis.   Electronically Signed   By: Myles Rosenthal M.D.   On: 07/22/2013 21:17   Dg Abd Acute W/chest  07/22/2013   CLINICAL DATA:  Abdominal pain. Chest pain. Cough. Vomiting. Diarrhea. Dyspnea. Cirrhosis.  EXAM: ACUTE ABDOMEN SERIES (ABDOMEN 2 VIEW & CHEST 1 VIEW)  COMPARISON:  04/07/2013 and 03/22/2013  FINDINGS: There is no evidence of dilated bowel loops or free intraperitoneal air. No radiopaque calculi or other significant radiographic abnormality is seen. Heart size and mediastinal contours are within  normal limits. Both lungs are clear.  IMPRESSION: Negative abdominal radiographs.  No acute cardiopulmonary disease.   Electronically Signed   By: Myles Rosenthal M.D.   On: 07/22/2013 19:59    Assessment/Plan  52 yo male with acute on chronic cholecystitis  Principal Problem:   Cholecystitis, acute with cholelithiasis-  gen surg at [facility] already notified and will see on arrival to [facility].  Npo x ice chips.  Iv pain meds and zofran.  Further management per gen surgery team.  Care order placed for staff at [facility] to notify surgery of arrival to [facility].  abd exam not acute, nonemergent at this time  Active Problems:   HEPATITIS C, CHRONIC   ALCOHOL ABUSE   CIRRHOSIS   Biliary colic    Marlyss Cissell A 07/22/2013, 9:31 PM

## 2013-07-22 NOTE — ED Notes (Signed)
Report given to [facility] w/[facility].

## 2013-07-22 NOTE — Progress Notes (Signed)
Report taken on pt.

## 2013-07-23 ENCOUNTER — Encounter (HOSPITAL_COMMUNITY): Payer: Self-pay | Admitting: *Deleted

## 2013-07-23 DIAGNOSIS — E119 Type 2 diabetes mellitus without complications: Secondary | ICD-10-CM

## 2013-07-23 DIAGNOSIS — R1011 Right upper quadrant pain: Secondary | ICD-10-CM

## 2013-07-23 DIAGNOSIS — F1010 Alcohol abuse, uncomplicated: Secondary | ICD-10-CM

## 2013-07-23 DIAGNOSIS — K7030 Alcoholic cirrhosis of liver without ascites: Secondary | ICD-10-CM

## 2013-07-23 DIAGNOSIS — K8020 Calculus of gallbladder without cholecystitis without obstruction: Secondary | ICD-10-CM

## 2013-07-23 LAB — CBC
MCHC: 33.8 g/dL (ref 30.0–36.0)
RBC: 3.04 MIL/uL — ABNORMAL LOW (ref 4.22–5.81)
WBC: 3.6 10*3/uL — ABNORMAL LOW (ref 4.0–10.5)

## 2013-07-23 LAB — COMPREHENSIVE METABOLIC PANEL
BUN: 13 mg/dL (ref 6–23)
CO2: 21 mEq/L (ref 19–32)
Calcium: 7.9 mg/dL — ABNORMAL LOW (ref 8.4–10.5)
Creatinine, Ser: 0.78 mg/dL (ref 0.50–1.35)
GFR calc Af Amer: >90 mL/min (ref >90)
GFR calc non Af Amer: >90 mL/min (ref >90)
Glucose, Bld: 75 mg/dL (ref 70–99)
Potassium: 3.7 mEq/L (ref 3.5–5.1)
Sodium: 136 mEq/L (ref 135–145)
Total Protein: 6.2 g/dL (ref 6.0–8.3)

## 2013-07-23 LAB — PROTIME-INR
INR: 1.31 (ref 0.00–1.49)
Prothrombin Time: 16 seconds — ABNORMAL HIGH (ref 11.6–15.2)

## 2013-07-23 LAB — GLUCOSE, CAPILLARY: Glucose-Capillary: 103 mg/dL — ABNORMAL HIGH (ref 70–99)

## 2013-07-23 LAB — AFP TUMOR MARKER: AFP-Tumor Marker: 16.6 ng/mL — ABNORMAL HIGH (ref 0.0–8.0)

## 2013-07-23 MED ORDER — INFLUENZA VAC SPLIT QUAD 0.5 ML IM SUSP: 0.5000 mL | INTRAMUSCULAR | Status: DC

## 2013-07-23 MED ORDER — ONDANSETRON HCL 4 MG/2ML IJ SOLN
4.0000 mg | Freq: Four times a day (QID) | INTRAMUSCULAR | Status: DC | PRN
  Administered 2013-07-23: 06:00:00 4 mg via INTRAVENOUS
  Filled 2013-07-23 (×2): qty 2

## 2013-07-23 MED ORDER — VITAMIN B-1 100 MG PO TABS
100.0000 mg | ORAL_TABLET | Freq: Every day | ORAL | Status: DC
  Administered 2013-07-23 – 2013-07-25 (×3): 100 mg via ORAL
  Filled 2013-07-23 (×3): qty 1

## 2013-07-23 MED ORDER — THIAMINE HCL 100 MG/ML IJ SOLN
100.0000 mg | Freq: Every day | INTRAMUSCULAR | Status: DC
  Filled 2013-07-23 (×3): qty 1

## 2013-07-23 MED ORDER — INFLUENZA VAC SPLIT QUAD 0.5 ML IM SUSP
0.5000 mL | INTRAMUSCULAR | Status: AC
  Administered 2013-07-23: 10:00:00 0.5 mL via INTRAMUSCULAR
  Filled 2013-07-23: qty 0.5

## 2013-07-23 MED ORDER — FUROSEMIDE 40 MG PO TABS
40.0000 mg | ORAL_TABLET | Freq: Every day | ORAL | Status: DC
  Administered 2013-07-23 – 2013-07-25 (×3): 40 mg via ORAL
  Filled 2013-07-23 (×3): qty 1

## 2013-07-23 MED ORDER — INSULIN ASPART 100 UNIT/ML ~~LOC~~ SOLN
0.0000 [IU] | SUBCUTANEOUS | Status: DC
  Administered 2013-07-23: 22:00:00 2 [IU] via SUBCUTANEOUS
  Administered 2013-07-24 (×3): 1 [IU] via SUBCUTANEOUS

## 2013-07-23 MED ORDER — SODIUM CHLORIDE 0.9 % IV SOLN
INTRAVENOUS | Status: DC
  Administered 2013-07-23 (×2): via INTRAVENOUS

## 2013-07-23 MED ORDER — FOLIC ACID 1 MG PO TABS
1.0000 mg | ORAL_TABLET | Freq: Every day | ORAL | Status: DC
  Administered 2013-07-23 – 2013-07-25 (×3): 1 mg via ORAL
  Filled 2013-07-23 (×3): qty 1

## 2013-07-23 MED ORDER — ADULT MULTIVITAMIN W/MINERALS CH
1.0000 | ORAL_TABLET | Freq: Every day | ORAL | Status: DC
  Administered 2013-07-23 – 2013-07-25 (×3): 1 via ORAL
  Filled 2013-07-23 (×3): qty 1

## 2013-07-23 MED ORDER — ALBUTEROL SULFATE (5 MG/ML) 0.5% IN NEBU: 2.5000 mg | INHALATION_SOLUTION | RESPIRATORY_TRACT | Status: DC | PRN

## 2013-07-23 MED ORDER — SODIUM CHLORIDE 0.9 % IJ SOLN
3.0000 mL | Freq: Two times a day (BID) | INTRAMUSCULAR | Status: DC
  Administered 2013-07-23: 22:00:00 via INTRAVENOUS
  Administered 2013-07-24: 3 mL via INTRAVENOUS

## 2013-07-23 MED ORDER — ONDANSETRON HCL 4 MG PO TABS: 4.0000 mg | ORAL_TABLET | Freq: Four times a day (QID) | ORAL | Status: DC | PRN

## 2013-07-23 MED ORDER — LORAZEPAM 1 MG PO TABS
1.0000 mg | ORAL_TABLET | Freq: Four times a day (QID) | ORAL | Status: DC | PRN
  Administered 2013-07-23 – 2013-07-25 (×4): 1 mg via ORAL
  Filled 2013-07-23 (×4): qty 1

## 2013-07-23 MED ORDER — LORAZEPAM 2 MG/ML IJ SOLN: 1.0000 mg | Freq: Four times a day (QID) | INTRAMUSCULAR | Status: DC | PRN

## 2013-07-23 MED ORDER — SPIRONOLACTONE 100 MG PO TABS
100.0000 mg | ORAL_TABLET | Freq: Every day | ORAL | Status: DC
  Administered 2013-07-23 – 2013-07-25 (×3): 100 mg via ORAL
  Filled 2013-07-23 (×3): qty 1

## 2013-07-23 NOTE — Progress Notes (Signed)
[facility] PROGRESS NOTE  [name] ZOX:[id number] DOB: 22-Jan-1961 DOA: 07/22/2013 PCP: [name], PA-C  HPI/Subjective: Still complaining about 5-6/10 RUQ abdominal pain.  Assessment/Plan: Principal Problem:   Cholecystitis, acute with cholelithiasis Active Problems:   HEPATITIS C, CHRONIC   ALCOHOL ABUSE   CIRRHOSIS   Biliary colic   RUQ abdominal pain -CT scan showed cholelithiasis without evidence of acute cholecystitis. -No leukocytosis or fever. -Could be secondary to hepatomegaly/hepatic congestion and stretching of the liver capsule. -Patient started on Zosyn, general surgery please advise. -Ultrasound to be done, general surgery is managing the RUQ abdominal pain.  Liver cirrhosis -Secondary to alcohol abuse/chronic hepatitis C. With preserved INR. -MELD score of 12 -No encephalopathy. -Has hypoalbuminemia, transaminitis, hyperbilirubinemia, thrombocytopenia and a slight hyperammonemia. -Not on diuretics, will start Aldactone and Lasix.  Diabetes mellitus -Carbohydrate modified diet, insulin sliding scale.  Pancytopenia -Secondary to chronic liver disease, patient has hypersplenism as well.  Code Status: Full code Family Communication: Plan discussed with the patient. Disposition Plan: Remains inpatient   Consultants:  General surgery  Procedures:  None  Antibiotics:  Zosyn   Objective: Filed Vitals:   07/23/13 0525  BP: 128/64  Pulse: 67  Temp: 98.8 F (37.1 C)  Resp: 16    Intake/Output Summary (Last 24 hours) at 07/23/13 1228 Last data filed at 07/23/13 8119  Gross per 24 hour  Intake      0 ml  Output    250 ml  Net   -250 ml   Filed Weights   07/22/13 1748 07/23/13 0131  Weight: 126.1 kg (278 lb) 126.871 kg (279 lb 11.2 oz)    Exam: General: Alert and awake, oriented x3, not in any acute distress. HEENT: anicteric sclera, pupils reactive to light and accommodation, EOMI CVS: S1-S2 clear, no murmur rubs  or gallops Chest: clear to auscultation bilaterally, no wheezing, rales or rhonchi Abdomen: soft nontender, nondistended, normal bowel sounds, no organomegaly Extremities: no cyanosis, clubbing or edema noted bilaterally Neuro: Cranial nerves II-XII intact, no focal neurological deficits  Data Reviewed: Basic Metabolic Panel:  Recent Labs Lab 07/22/13 1837 07/23/13 0400  NA 137 136  K 3.8 3.7  CL 106 106  CO2 21 21  GLUCOSE 82 75  BUN 12 13  CREATININE 0.81 0.78  CALCIUM 8.9 7.9*   Liver Function Tests:  Recent Labs Lab 07/22/13 1837 07/23/13 0400  AST 210* 184*  ALT 100* 84*  ALKPHOS 136* 113  BILITOT 2.2* 2.2*  PROT 7.4 6.2  ALBUMIN 2.9* 2.5*    Recent Labs Lab 07/22/13 1837  LIPASE 96*    Recent Labs Lab 07/22/13 2328  AMMONIA 79*   CBC:  Recent Labs Lab 07/22/13 1837 07/23/13 0400  WBC 4.2 3.6*  NEUTROABS 2.7  --   HGB 11.5* 9.8*  HCT 33.4* 29.0*  MCV 94.9 95.4  PLT 69* 63*   Cardiac Enzymes:  Recent Labs Lab 07/22/13 1837  TROPONINI <0.30   BNP (last 3 results) No results found for this basename: PROBNP,  in the last 8760 hours CBG:  Recent Labs Lab 07/23/13 0326 07/23/13 0806  GLUCAP 72 76    Micro No results found for this or any previous visit (from the past 240 hour(s)).   Studies: Ct Abdomen Pelvis W Contrast  07/22/2013   CLINICAL DATA:  Abdominal pain. Vomiting. Diarrhea. Cholelithiasis. Cirrhosis.  EXAM: CT ABDOMEN AND PELVIS WITH CONTRAST  TECHNIQUE: Multidetector CT imaging of the abdomen and pelvis was performed using the standard  protocol following bolus administration of intravenous contrast.  CONTRAST:  OMNIPAQUE IOHEXOL 300 MG/ML  SOLN  COMPARISON:  04/07/2013  FINDINGS: Hepatic cirrhosis again demonstrated. No liver masses are identified. Marked splenomegaly again seen, as well as prominent left abdominal and pelvic venous collaterals, consistent with portal venous hypertension. Increased diffuse mesenteric  edema and mild ascites is seen since prior study.  Tiny calcified gallstones again noted, however there is no evidence of cholecystitis. The pancreas, adrenal glands, and kidneys are normal in appearance, except for a tiny nonobstructing calculus in the lower pole of the left kidney. No evidence of hydronephrosis.  No soft tissue masses or lymphadenopathy identified. No focal inflammatory process or abscess identified. No evidence of dilated bowel loops or hernia.  IMPRESSION: Increased mild ascites and diffuse mesenteric edema.  Stable appearance of hepatic cirrhosis, marked splenomegaly, and prominent left abdominal and pelvic venous collaterals, consistent with portal venous hypertension. No hepatic neoplasm visualized.  Nonobstructive left nephrolithiasis.  No evidence of hydronephrosis.  Cholelithiasis. No radiographic evidence of cholecystitis.   Electronically Signed   By: Myles Rosenthal M.D.   On: 07/22/2013 21:17   Dg Abd Acute W/chest  07/22/2013   CLINICAL DATA:  Abdominal pain. Chest pain. Cough. Vomiting. Diarrhea. Dyspnea. Cirrhosis.  EXAM: ACUTE ABDOMEN SERIES (ABDOMEN 2 VIEW & CHEST 1 VIEW)  COMPARISON:  04/07/2013 and 03/22/2013  FINDINGS: There is no evidence of dilated bowel loops or free intraperitoneal air. No radiopaque calculi or other significant radiographic abnormality is seen. Heart size and mediastinal contours are within normal limits. Both lungs are clear.  IMPRESSION: Negative abdominal radiographs.  No acute cardiopulmonary disease.   Electronically Signed   By: Myles Rosenthal M.D.   On: 07/22/2013 19:59    Scheduled Meds: . folic acid  1 mg Oral Daily  . insulin aspart  0-9 Units Subcutaneous Q4H  . multivitamin with minerals  1 tablet Oral Daily  . sodium chloride  3 mL Intravenous Q12H  . thiamine  100 mg Oral Daily   Or  . thiamine  100 mg Intravenous Daily   Continuous Infusions: . sodium chloride 75 mL/hr at 07/23/13 0154       Time spent: 35  minutes    [facility] A  [facility] Pager [phone] If 7PM-7AM, please contact night-coverage at [email or web address], password [email or web address] 07/23/2013, 12:28 PM  LOS: 1 day

## 2013-07-23 NOTE — Progress Notes (Signed)
Pt admitted to unit, room 5w33. Pt is alert and oriented. Admitted with abdominal pain, no current complaints of nausea or vomiting. Provider Dr. Julian Reil notified of pt arrival onto unit. Dr. Julian Reil gave instructions to notify general surgery of pt arrival, paged [phone]. Pt has been oriented to room and unit. Pt informed to use call bell when assistance needed. Call bell placed within reach. Will continue to monitor pt per MD orders.

## 2013-07-23 NOTE — Progress Notes (Signed)
Subjective: Continues to have ruq abdominal pain, chronic in nature.  No n/v/d.    Objective: Vital signs in last 24 hours: Temp:  [98 F (36.7 C)-99.1 F (37.3 C)] 98.8 F (37.1 C) (12/13 0525) Pulse Rate:  [55-88] 67 (12/13 0525) Resp:  [14-20] 16 (12/13 0525) BP: (125-166)/(55-78) 128/64 mmHg (12/13 0525) SpO2:  [96 %-100 %] 96 % (12/13 0525) Weight:  [278 lb (126.1 kg)-279 lb 11.2 oz (126.871 kg)] 279 lb 11.2 oz (126.871 kg) (12/13 0131) Last BM Date: 07/23/13  Intake/Output from previous day: 12/12 0701 - 12/13 0700 In: -  Out: 250 [Urine:250] Intake/Output this shift:    General appearance: alert, cooperative and no distress GI: +bs, abdomen is soft round and TTP to RUQ.    Lab Results:   Recent Labs  07/22/13 1837 07/23/13 0400  WBC 4.2 3.6*  HGB 11.5* 9.8*  HCT 33.4* 29.0*  PLT 69* 63*   BMET  Recent Labs  07/22/13 1837 07/23/13 0400  NA 137 136  K 3.8 3.7  CL 106 106  CO2 21 21  GLUCOSE 82 75  BUN 12 13  CREATININE 0.81 0.78  CALCIUM 8.9 7.9*   PT/INR  Recent Labs  07/23/13 0400  LABPROT 16.0*  INR 1.31   ABG No results found for this basename: PHART, PCO2, PO2, HCO3,  in the last 72 hours  Studies/Results: Ct Abdomen Pelvis W Contrast  07/22/2013   CLINICAL DATA:  Abdominal pain. Vomiting. Diarrhea. Cholelithiasis. Cirrhosis.  EXAM: CT ABDOMEN AND PELVIS WITH CONTRAST  TECHNIQUE: Multidetector CT imaging of the abdomen and pelvis was performed using the standard protocol following bolus administration of intravenous contrast.  CONTRAST:  OMNIPAQUE IOHEXOL 300 MG/ML  SOLN  COMPARISON:  04/07/2013  FINDINGS: Hepatic cirrhosis again demonstrated. No liver masses are identified. Marked splenomegaly again seen, as well as prominent left abdominal and pelvic venous collaterals, consistent with portal venous hypertension. Increased diffuse mesenteric edema and mild ascites is seen since prior study.  Tiny calcified gallstones again noted,  however there is no evidence of cholecystitis. The pancreas, adrenal glands, and kidneys are normal in appearance, except for a tiny nonobstructing calculus in the lower pole of the left kidney. No evidence of hydronephrosis.  No soft tissue masses or lymphadenopathy identified. No focal inflammatory process or abscess identified. No evidence of dilated bowel loops or hernia.  IMPRESSION: Increased mild ascites and diffuse mesenteric edema.  Stable appearance of hepatic cirrhosis, marked splenomegaly, and prominent left abdominal and pelvic venous collaterals, consistent with portal venous hypertension. No hepatic neoplasm visualized.  Nonobstructive left nephrolithiasis.  No evidence of hydronephrosis.  Cholelithiasis. No radiographic evidence of cholecystitis.   Electronically Signed   By: Myles Rosenthal M.D.   On: 07/22/2013 21:17   Dg Abd Acute W/chest  07/22/2013   CLINICAL DATA:  Abdominal pain. Chest pain. Cough. Vomiting. Diarrhea. Dyspnea. Cirrhosis.  EXAM: ACUTE ABDOMEN SERIES (ABDOMEN 2 VIEW & CHEST 1 VIEW)  COMPARISON:  04/07/2013 and 03/22/2013  FINDINGS: There is no evidence of dilated bowel loops or free intraperitoneal air. No radiopaque calculi or other significant radiographic abnormality is seen. Heart size and mediastinal contours are within normal limits. Both lungs are clear.  IMPRESSION: Negative abdominal radiographs.  No acute cardiopulmonary disease.   Electronically Signed   By: Myles Rosenthal M.D.   On: 07/22/2013 19:59    Anti-infectives: Anti-infectives   Start     Dose/Rate Route Frequency Ordered Stop   07/22/13 2015  piperacillin-tazobactam (ZOSYN) IVPB  3.375 g     3.375 g 12.5 mL/hr over 240 Minutes Intravenous  Once 07/22/13 2011 07/22/13 2311      Assessment/Plan: Hepatitis C, Cirrhosis(Childs B) ETOH abuse  Chronic RUQ abdominal pain Cholelithiasis   White count is normal, CT does not suggest acute cholecystitis.  He is a very high risk surgical candidate.   Recommend GI consult and consider transfer to a tertiary care facility such as [facility].  He was previously seen at [facility], approximately 2 years ago according to the patient.     LOS: 1 day    Jersey Ravenscroft ANP-BC 07/23/2013 8:35 AM

## 2013-07-23 NOTE — Consult Note (Addendum)
Reason for Consult:  RUQ pain Referring Physician: [name], [name] is an 52 y.o. male.  HPI: This is a 52 yo male with alcoholic cirrhosis and hepatitis C who has been followed by Dr. Scarlett Castillo at [facility] for gallstones.  We do not have access to Dr. Daisy Castillo notes, but he was apparently seen as recently as last month.  He apparently has also been evaluated at [facility] and felt to be too high-risk for cholecystectomy.  He has had worsening right-sided abdominal pain since at least July.  He presented to the ED at [facility] with RUQ pain, nausea, and vomiting.  The EDP called Dr. Malvin Castillo, who declined to see the patient and recommended transfer.  Work-up included a CT scan which shows no signs of acute cholecystitis.  Past Medical History  Diagnosis Date  . Hypertension   . Diabetes mellitus   . GERD (gastroesophageal reflux disease)     DEC 2010 EGD/Bx REACTIVE GASTROPATHY, NO VARICES  . Hemorrhoids, internal   . BMI 40.0-44.9, adult OCT 2010 269 LBS    APR 2012 279 LBS AUG 2014 185 LBS  . 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 IV drug abuse REMOTE  . Hepatitis 2010 HEP C    AST 509 ALT 267 ALK PHOS 165 ALB 3.8 NEG IGM HAV/HBSAg  . Gallstone AUG 2012 1 CM  . GERD 10/25/2009  . COPD (chronic obstructive pulmonary disease)   . Hepatitis C   . Pancytopenia 2013  . Splenomegaly 2013  . Other pancytopenia 02/18/2013  . Iron (Fe) deficiency anemia 02/18/2013  . Splenomegaly 02/18/2013    Past Surgical History  Procedure Laterality Date  . Sigmoidoscopy      2001 DR. FLEISCHMAN INTERNAL HERMORRHOIDS  . Upper gastrointestinal endoscopy  DEC 2010    BENIGN POLYPS, GASTRITIS, ?phg  . Knee surgery  RIGHT  . Hemorrhoid surgery    . Esophageal biopsy  09/08/2011    JXB:JYNWGNFA gastritis/Polyps, multiple in the body of the stomach    Family History  Problem Relation Age of Onset  . Colon cancer Neg Hx   . Anesthesia problems Neg Hx   . Hypotension  Neg Hx   . Malignant hyperthermia Neg Hx   . Pseudochol deficiency Neg Hx   . Cancer Father     Social History:  reports that he has never smoked. He has never used smokeless tobacco. He reports that he uses illicit drugs (Marijuana and Cocaine). He reports that he does not drink alcohol.  Former heavy EtOH use  Allergies: No Known Allergies  Medications:  Prior to Admission medications   Medication Sig Start Date End Date Taking? Authorizing Provider  albuterol (PROVENTIL HFA;VENTOLIN HFA) 108 (90 BASE) MCG/ACT inhaler Inhale 2 puffs into the lungs every 6 (six) hours as needed. Shortness of breath   Yes Historical Provider, MD  esomeprazole (NEXIUM) 40 MG capsule Take 40 mg by mouth 2 (two) times daily.   Yes Historical Provider, MD  glipiZIDE (GLUCOTROL) 10 MG tablet Take 10 mg by mouth 2 (two) times daily before a meal.     Yes Historical Provider, MD  insulin glargine (LANTUS) 100 UNIT/ML injection Inject 10 Units into the skin at bedtime.   Yes Historical Provider, MD  lisinopril (PRINIVIL,ZESTRIL) 40 MG tablet Take 40 mg by mouth daily.   Yes Historical Provider, MD  metFORMIN (GLUCOPHAGE) 1000 MG tablet Take 1,000 mg by mouth 2 (two) times daily with a  meal.   Yes Historical Provider, MD  oxyCODONE (OXY IR/ROXICODONE) 5 MG immediate release tablet Take 2 tablets (10 mg total) by mouth every 4 (four) hours as needed for severe pain. 07/06/13   Alla German, MD     Results for orders placed during the hospital encounter of 07/22/13 (from the past 48 hour(s))  CBC WITH DIFFERENTIAL     Status: Abnormal   Collection Time    07/22/13  6:37 PM      Result Value Range   WBC 4.2  4.0 - 10.5 K/uL   RBC 3.52 (*) 4.22 - 5.81 MIL/uL   Hemoglobin 11.5 (*) 13.0 - 17.0 g/dL   HCT 16.1 (*) 09.6 - 04.5 %   MCV 94.9  78.0 - 100.0 fL   MCH 32.7  26.0 - 34.0 pg   MCHC 34.4  30.0 - 36.0 g/dL   RDW 40.9  81.1 - 91.4 %   Platelets 69 (*) 150 - 400 K/uL   Neutrophils Relative % 64  43 - 77 %    Neutro Abs 2.7  1.7 - 7.7 K/uL   Lymphocytes Relative 20  12 - 46 %   Lymphs Abs 0.9  0.7 - 4.0 K/uL   Monocytes Relative 14 (*) 3 - 12 %   Monocytes Absolute 0.6  0.1 - 1.0 K/uL   Eosinophils Relative 2  0 - 5 %   Eosinophils Absolute 0.1  0.0 - 0.7 K/uL   Basophils Relative 0  0 - 1 %   Basophils Absolute 0.0  0.0 - 0.1 K/uL   Smear Review SPECIMEN CHECKED FOR CLOTS     Comment: PLATELET COUNT CONFIRMED BY SMEAR  COMPREHENSIVE METABOLIC PANEL     Status: Abnormal   Collection Time    07/22/13  6:37 PM      Result Value Range   Sodium 137  135 - 145 mEq/L   Potassium 3.8  3.5 - 5.1 mEq/L   Chloride 106  96 - 112 mEq/L   CO2 21  19 - 32 mEq/L   Glucose, Bld 82  70 - 99 mg/dL   BUN 12  6 - 23 mg/dL   Creatinine, Ser 7.82  0.50 - 1.35 mg/dL   Calcium 8.9  8.4 - 95.6 mg/dL   Total Protein 7.4  6.0 - 8.3 g/dL   Albumin 2.9 (*) 3.5 - 5.2 g/dL   AST 213 (*) 0 - 37 U/L   ALT 100 (*) 0 - 53 U/L   Alkaline Phosphatase 136 (*) 39 - 117 U/L   Total Bilirubin 2.2 (*) 0.3 - 1.2 mg/dL   GFR calc non Af Amer >90  >90 mL/min   GFR calc Af Amer >90  >90 mL/min   Comment: (NOTE)     The eGFR has been calculated using the CKD EPI equation.     This calculation has not been validated in all clinical situations.     eGFR's persistently <90 mL/min signify possible Chronic Kidney     Disease.  LIPASE, BLOOD     Status: Abnormal   Collection Time    07/22/13  6:37 PM      Result Value Range   Lipase 96 (*) 11 - 59 U/L  TROPONIN I     Status: None   Collection Time    07/22/13  6:37 PM      Result Value Range   Troponin I <0.30  <0.30 ng/mL   Comment:  Due to the release kinetics of cTnI,     a negative result within the first hours     of the onset of symptoms does not rule out     myocardial infarction with certainty.     If myocardial infarction is still suspected,     repeat the test at appropriate intervals.  URINALYSIS, ROUTINE W REFLEX MICROSCOPIC     Status: Abnormal    Collection Time    07/22/13  7:13 PM      Result Value Range   Color, Urine ORANGE (*) YELLOW   Comment: BIOCHEMICALS MAY BE AFFECTED BY COLOR   APPearance CLEAR  CLEAR   Specific Gravity, Urine >1.030 (*) 1.005 - 1.030   pH 6.0  5.0 - 8.0   Glucose, UA NEGATIVE  NEGATIVE mg/dL   Hgb urine dipstick TRACE (*) NEGATIVE   Bilirubin Urine NEGATIVE  NEGATIVE   Ketones, ur NEGATIVE  NEGATIVE mg/dL   Protein, ur NEGATIVE  NEGATIVE mg/dL   Urobilinogen, UA 0.2  0.0 - 1.0 mg/dL   Nitrite NEGATIVE  NEGATIVE   Leukocytes, UA NEGATIVE  NEGATIVE  URINE MICROSCOPIC-ADD ON     Status: None   Collection Time    07/22/13  7:13 PM      Result Value Range   WBC, UA 21-50  <3 WBC/hpf   RBC / HPF 0-2  <3 RBC/hpf   Bacteria, UA RARE  RARE  AMMONIA     Status: Abnormal   Collection Time    07/22/13 11:28 PM      Result Value Range   Ammonia 79 (*) 11 - 60 umol/L  LACTIC ACID, PLASMA     Status: None   Collection Time    07/22/13 11:28 PM      Result Value Range   Lactic Acid, Venous 1.1  0.5 - 2.2 mmol/L    Ct Abdomen Pelvis W Contrast  07/22/2013   CLINICAL DATA:  Abdominal pain. Vomiting. Diarrhea. Cholelithiasis. Cirrhosis.  EXAM: CT ABDOMEN AND PELVIS WITH CONTRAST  TECHNIQUE: Multidetector CT imaging of the abdomen and pelvis was performed using the standard protocol following bolus administration of intravenous contrast.  CONTRAST:  OMNIPAQUE IOHEXOL 300 MG/ML  SOLN  COMPARISON:  04/07/2013  FINDINGS: Hepatic cirrhosis again demonstrated. No liver masses are identified. Marked splenomegaly again seen, as well as prominent left abdominal and pelvic venous collaterals, consistent with portal venous hypertension. Increased diffuse mesenteric edema and mild ascites is seen since prior study.  Tiny calcified gallstones again noted, however there is no evidence of cholecystitis. The pancreas, adrenal glands, and kidneys are normal in appearance, except for a tiny nonobstructing calculus in the  lower pole of the left kidney. No evidence of hydronephrosis.  No soft tissue masses or lymphadenopathy identified. No focal inflammatory process or abscess identified. No evidence of dilated bowel loops or hernia.  IMPRESSION: Increased mild ascites and diffuse mesenteric edema.  Stable appearance of hepatic cirrhosis, marked splenomegaly, and prominent left abdominal and pelvic venous collaterals, consistent with portal venous hypertension. No hepatic neoplasm visualized.  Nonobstructive left nephrolithiasis.  No evidence of hydronephrosis.  Cholelithiasis. No radiographic evidence of cholecystitis.   Electronically Signed   By: Myles Rosenthal M.D.   On: 07/22/2013 21:17   Dg Abd Acute W/chest  07/22/2013   CLINICAL DATA:  Abdominal pain. Chest pain. Cough. Vomiting. Diarrhea. Dyspnea. Cirrhosis.  EXAM: ACUTE ABDOMEN SERIES (ABDOMEN 2 VIEW & CHEST 1 VIEW)  COMPARISON:  04/07/2013 and 03/22/2013  FINDINGS:  There is no evidence of dilated bowel loops or free intraperitoneal air. No radiopaque calculi or other significant radiographic abnormality is seen. Heart size and mediastinal contours are within normal limits. Both lungs are clear.  IMPRESSION: Negative abdominal radiographs.  No acute cardiopulmonary disease.   Electronically Signed   By: Myles Rosenthal M.D.   On: 07/22/2013 19:59    Review of Systems  Constitutional: Negative for weight loss.  HENT: Negative for ear discharge, ear pain, hearing loss and tinnitus.   Eyes: Negative for blurred vision, double vision, photophobia and pain.  Respiratory: Negative for cough, sputum production and shortness of breath.   Cardiovascular: Negative for chest pain.  Gastrointestinal: Positive for nausea, vomiting and abdominal pain.  Genitourinary: Negative for dysuria, urgency, frequency and flank pain.  Musculoskeletal: Negative for back pain, falls, joint pain, myalgias and neck pain.  Neurological: Negative for dizziness, tingling, sensory change, focal  weakness, loss of consciousness and headaches.  Endo/Heme/Allergies: Does not bruise/bleed easily.  Psychiatric/Behavioral: Negative for depression, memory loss and substance abuse. The patient is not nervous/anxious.    Blood pressure 160/78, pulse 71, temperature 98 F (36.7 C), temperature source Axillary, resp. rate 16, height 5\' 9"  (1.753 m), weight 279 lb 11.2 oz (126.871 kg), SpO2 99.00%. Physical Exam Obese male in NAD HEENT:  EOMI, sclera anicteric Neck:  No masses, no thyromegaly Lungs:  CTA bilaterally; normal respiratory effort CV:  Regular rate and rhythm; no murmurs Abd:  Obese, tender in RUQ; + BS Ext:  Well-perfused; no edema Skin:  Warm, dry; no sign of jaundice  Assessment/Plan: RUQ tenderness - longstanding over the last 5 months; no radiologic or laboratory evidence of acute cholecystitis Extremely high risk for any type of surgical procedure due to Childs B cirrhosis, portal hypertension, and the dilated venous collateral vessels.  He would likely have very significant bleeding during and after surgery.  Recs:  Treat him symptomatically Consult GI or transfer to [facility] where he has been evaluated before. Check INR.      Maleia Weems K. 07/23/2013, 3:27 AM

## 2013-07-23 NOTE — Progress Notes (Signed)
Agree with A&P of ER,NP. He is stillhaving a lot of pain. Will check sono to see if evidence of acute cholecystitis missed on CT.  He will be high risk for surgery and might be better with perc drain if this is indeed acute cholecystitis. His normal wbc is reassuring that it is NOT acute cholecystitis

## 2013-07-24 ENCOUNTER — Inpatient Hospital Stay (HOSPITAL_COMMUNITY): Payer: Medicaid Other

## 2013-07-24 DIAGNOSIS — B182 Chronic viral hepatitis C: Secondary | ICD-10-CM

## 2013-07-24 DIAGNOSIS — D61818 Other pancytopenia: Secondary | ICD-10-CM

## 2013-07-24 LAB — URINALYSIS, ROUTINE W REFLEX MICROSCOPIC
Bilirubin Urine: NEGATIVE
Glucose, UA: NEGATIVE mg/dL
Ketones, ur: NEGATIVE mg/dL
Leukocytes, UA: NEGATIVE
Nitrite: NEGATIVE
Protein, ur: NEGATIVE mg/dL
Urobilinogen, UA: 1 mg/dL (ref 0.0–1.0)

## 2013-07-24 LAB — BASIC METABOLIC PANEL
BUN: 18 mg/dL (ref 6–23)
Calcium: 8 mg/dL — ABNORMAL LOW (ref 8.4–10.5)
GFR calc Af Amer: >90 mL/min (ref >90)
GFR calc non Af Amer: >90 mL/min (ref >90)
Glucose, Bld: 122 mg/dL — ABNORMAL HIGH (ref 70–99)
Sodium: 136 mEq/L (ref 135–145)

## 2013-07-24 LAB — GLUCOSE, CAPILLARY
Glucose-Capillary: 109 mg/dL — ABNORMAL HIGH (ref 70–99)
Glucose-Capillary: 112 mg/dL — ABNORMAL HIGH (ref 70–99)
Glucose-Capillary: 132 mg/dL — ABNORMAL HIGH (ref 70–99)
Glucose-Capillary: 133 mg/dL — ABNORMAL HIGH (ref 70–99)
Glucose-Capillary: 135 mg/dL — ABNORMAL HIGH (ref 70–99)
Glucose-Capillary: 144 mg/dL — ABNORMAL HIGH (ref 70–99)

## 2013-07-24 LAB — CBC
MCH: 32 pg (ref 26.0–34.0)
MCHC: 33.6 g/dL (ref 30.0–36.0)
Platelets: 56 10*3/uL — ABNORMAL LOW (ref 150–400)
RBC: 3.06 MIL/uL — ABNORMAL LOW (ref 4.22–5.81)

## 2013-07-24 MED ORDER — INSULIN ASPART 100 UNIT/ML ~~LOC~~ SOLN
0.0000 [IU] | Freq: Three times a day (TID) | SUBCUTANEOUS | Status: DC
  Administered 2013-07-24: 1 [IU] via SUBCUTANEOUS

## 2013-07-24 MED ORDER — DIPHENHYDRAMINE HCL 25 MG PO CAPS
25.0000 mg | ORAL_CAPSULE | Freq: Three times a day (TID) | ORAL | Status: DC | PRN
  Administered 2013-07-24: 25 mg via ORAL
  Filled 2013-07-24: qty 1

## 2013-07-24 NOTE — Progress Notes (Signed)
[facility] PROGRESS NOTE  Meryl Ponder Prevo ZOX:[id number] DOB: 1960-12-14 DOA: 07/22/2013 PCP: [name], PA-C  HPI/Subjective: Pain is still there, no fever or chills. CT abdomen/ultrasound negative for acute cholecystitis. If pain subsides can be discharged in a.m. probably to  Assessment/Plan: Principal Problem:   Cholecystitis, acute with cholelithiasis Active Problems:   HEPATITIS C, CHRONIC   ALCOHOL ABUSE   CIRRHOSIS   Biliary colic   RUQ abdominal pain -CT scan showed cholelithiasis without evidence of acute cholecystitis. -No leukocytosis or fever. -Could be secondary to hepatomegaly/hepatic congestion and stretching of the liver capsule. -Ultrasound is negative. -Urine showed 21-50 white blood cells without nitrite, LE or bacteria, but is still pyuria is not normal. -Patient is on Zosyn, continue antibiotics and diuretics for today.  Liver cirrhosis -Secondary to alcohol abuse/chronic hepatitis C. With preserved INR. -MELD score of 12 -No encephalopathy. -Has hypoalbuminemia, transaminitis, hyperbilirubinemia, thrombocytopenia and a slight hyperammonemia. -Not on diuretics, will start Aldactone and Lasix.  Diabetes mellitus -Carbohydrate modified diet, insulin sliding scale.  Pancytopenia -Secondary to chronic liver disease, patient has hypersplenism as well.  Code Status: Full code Family Communication: Plan discussed with the patient. Disposition Plan: Remains inpatient   Consultants:  General surgery  Procedures:  None  Antibiotics:  Zosyn  Objective: Filed Vitals:   07/24/13 0540  BP: 138/66  Pulse: 89  Temp: 98.1 F (36.7 C)  Resp: 18    Intake/Output Summary (Last 24 hours) at 07/24/13 1202 Last data filed at 07/23/13 1800  Gross per 24 hour  Intake    600 ml  Output    200 ml  Net    400 ml   Filed Weights   07/22/13 1748 07/23/13 0131  Weight: 126.1 kg (278 lb) 126.871 kg (279 lb 11.2 oz)    Exam: General:  Alert and awake, oriented x3, not in any acute distress. HEENT: anicteric sclera, pupils reactive to light and accommodation, EOMI CVS: S1-S2 clear, no murmur rubs or gallops Chest: clear to auscultation bilaterally, no wheezing, rales or rhonchi Abdomen: soft nontender, nondistended, normal bowel sounds, no organomegaly Extremities: no cyanosis, clubbing or edema noted bilaterally Neuro: Cranial nerves II-XII intact, no focal neurological deficits  Data Reviewed: Basic Metabolic Panel:  Recent Labs Lab 07/22/13 1837 07/23/13 0400 07/24/13 0500  NA 137 136 136  K 3.8 3.7 4.2  CL 106 106 104  CO2 21 21 25   GLUCOSE 82 75 122*  BUN 12 13 18   CREATININE 0.81 0.78 0.87  CALCIUM 8.9 7.9* 8.0*   Liver Function Tests:  Recent Labs Lab 07/22/13 1837 07/23/13 0400  AST 210* 184*  ALT 100* 84*  ALKPHOS 136* 113  BILITOT 2.2* 2.2*  PROT 7.4 6.2  ALBUMIN 2.9* 2.5*    Recent Labs Lab 07/22/13 1837  LIPASE 96*    Recent Labs Lab 07/22/13 2328  AMMONIA 79*   CBC:  Recent Labs Lab 07/22/13 1837 07/23/13 0400 07/24/13 0500  WBC 4.2 3.6* 2.8*  NEUTROABS 2.7  --   --   HGB 11.5* 9.8* 9.8*  HCT 33.4* 29.0* 29.2*  MCV 94.9 95.4 95.4  PLT 69* 63* 56*   Cardiac Enzymes:  Recent Labs Lab 07/22/13 1837  TROPONINI <0.30   BNP (last 3 results) No results found for this basename: PROBNP,  in the last 8760 hours CBG:  Recent Labs Lab 07/23/13 1658 07/23/13 2015 07/24/13 0015 07/24/13 0421 07/24/13 0758  GLUCAP 103* 168* 112* 132* 109*    Micro No results found  for this or any previous visit (from the past 240 hour(s)).   Studies: US Abdomen Complete  07/24/2013   CLINICAL DATA:  Right upper quadrant abdominal pain, cirrhosis, splenomegaly  EXAM: ULTRASOUND ABDOMEN COMPLETE  COMPARISON:  CT abdomen pelvis dated 07/22/2013  FINDINGS: Gallbladder:  Cholelithiasis, measuring up to 2.1 cm. No gallbladder wall thickening or pericholecystic fluid. Negative  sonographic Murphy's sign.  Common bile duct:  Diameter: 4 mm.  Poorly visualized.  Liver:  Nodular hepatic contour with coarse echotexture, compatible with cirrhosis. No focal hepatic lesion is seen.  IVC:  No abnormality visualized.  Pancreas:  Not visualized due to overlying bowel gas.  Spleen:  Enlarged, measuring 21.6 x 22.5 x 14.2 cm (calculated volume 3641 mL). Perisplenic varices.  Right Kidney:  Length: 12.8 cm.  No mass or hydronephrosis.  Left Kidney:  Length: 13.9 cm.  No mass or hydronephrosis.  Abdominal aorta:  Not visualized due to overlying bowel gas.  Other findings:  Ascites.  IMPRESSION: Cholelithiasis, without associated findings to suggest acute cholecystitis.  Cirrhosis.  No focal hepatic lesion is seen.  Splenomegaly with perisplenic varices.   Electronically Signed   By: Charline Bills M.D.   On: 07/24/2013 09:48   Ct Abdomen Pelvis W Contrast  07/22/2013   CLINICAL DATA:  Abdominal pain. Vomiting. Diarrhea. Cholelithiasis. Cirrhosis.  EXAM: CT ABDOMEN AND PELVIS WITH CONTRAST  TECHNIQUE: Multidetector CT imaging of the abdomen and pelvis was performed using the standard protocol following bolus administration of intravenous contrast.  CONTRAST:  OMNIPAQUE IOHEXOL 300 MG/ML  SOLN  COMPARISON:  04/07/2013  FINDINGS: Hepatic cirrhosis again demonstrated. No liver masses are identified. Marked splenomegaly again seen, as well as prominent left abdominal and pelvic venous collaterals, consistent with portal venous hypertension. Increased diffuse mesenteric edema and mild ascites is seen since prior study.  Tiny calcified gallstones again noted, however there is no evidence of cholecystitis. The pancreas, adrenal glands, and kidneys are normal in appearance, except for a tiny nonobstructing calculus in the lower pole of the left kidney. No evidence of hydronephrosis.  No soft tissue masses or lymphadenopathy identified. No focal inflammatory process or abscess identified. No evidence  of dilated bowel loops or hernia.  IMPRESSION: Increased mild ascites and diffuse mesenteric edema.  Stable appearance of hepatic cirrhosis, marked splenomegaly, and prominent left abdominal and pelvic venous collaterals, consistent with portal venous hypertension. No hepatic neoplasm visualized.  Nonobstructive left nephrolithiasis.  No evidence of hydronephrosis.  Cholelithiasis. No radiographic evidence of cholecystitis.   Electronically Signed   By: Myles Rosenthal M.D.   On: 07/22/2013 21:17   Dg Abd Acute W/chest  07/22/2013   CLINICAL DATA:  Abdominal pain. Chest pain. Cough. Vomiting. Diarrhea. Dyspnea. Cirrhosis.  EXAM: ACUTE ABDOMEN SERIES (ABDOMEN 2 VIEW & CHEST 1 VIEW)  COMPARISON:  04/07/2013 and 03/22/2013  FINDINGS: There is no evidence of dilated bowel loops or free intraperitoneal air. No radiopaque calculi or other significant radiographic abnormality is seen. Heart size and mediastinal contours are within normal limits. Both lungs are clear.  IMPRESSION: Negative abdominal radiographs.  No acute cardiopulmonary disease.   Electronically Signed   By: Myles Rosenthal M.D.   On: 07/22/2013 19:59    Scheduled Meds: . folic acid  1 mg Oral Daily  . furosemide  40 mg Oral Daily  . insulin aspart  0-9 Units Subcutaneous Q4H  . multivitamin with minerals  1 tablet Oral Daily  . sodium chloride  3 mL Intravenous Q12H  . spironolactone  100 mg Oral Daily  . thiamine  100 mg Oral Daily   Or  . thiamine  100 mg Intravenous Daily   Continuous Infusions:       Time spent: 35 minutes    [facility] A  [facility] Pager [phone] If 7PM-7AM, please contact night-coverage at [email or web address], password [facility] 07/24/2013, 12:02 PM  LOS: 2 days

## 2013-07-24 NOTE — Progress Notes (Signed)
Subjective: Emesis x1 yesterday after eating.  Pain unchanged.    Objective: Vital signs in last 24 hours: Temp:  [98 F (36.7 C)-98.4 F (36.9 C)] 98.1 F (36.7 C) (12/14 0540) Pulse Rate:  [77-89] 89 (12/14 0540) Resp:  [18] 18 (12/14 0540) BP: (138-159)/(66-72) 138/66 mmHg (12/14 0540) SpO2:  [98 %-99 %] 99 % (12/14 0540) Last BM Date: 07/23/13  Intake/Output from previous day: 12/13 0701 - 12/14 0700 In: 600 [P.O.:600] Out: 200 [Urine:200] Intake/Output this shift:    PE General appearance: alert, cooperative and no distress  GI: +bs, abdomen is soft round and TTP to RUQ.    Lab Results:   Recent Labs  07/23/13 0400 07/24/13 0500  WBC 3.6* 2.8*  HGB 9.8* 9.8*  HCT 29.0* 29.2*  PLT 63* 56*   BMET  Recent Labs  07/23/13 0400 07/24/13 0500  NA 136 136  K 3.7 4.2  CL 106 104  CO2 21 25  GLUCOSE 75 122*  BUN 13 18  CREATININE 0.78 0.87  CALCIUM 7.9* 8.0*   PT/INR  Recent Labs  07/23/13 0400  LABPROT 16.0*  INR 1.31   ABG No results found for this basename: PHART, PCO2, PO2, HCO3,  in the last 72 hours  Studies/Results: Ct Abdomen Pelvis W Contrast  07/22/2013   CLINICAL DATA:  Abdominal pain. Vomiting. Diarrhea. Cholelithiasis. Cirrhosis.  EXAM: CT ABDOMEN AND PELVIS WITH CONTRAST  TECHNIQUE: Multidetector CT imaging of the abdomen and pelvis was performed using the standard protocol following bolus administration of intravenous contrast.  CONTRAST:  OMNIPAQUE IOHEXOL 300 MG/ML  SOLN  COMPARISON:  04/07/2013  FINDINGS: Hepatic cirrhosis again demonstrated. No liver masses are identified. Marked splenomegaly again seen, as well as prominent left abdominal and pelvic venous collaterals, consistent with portal venous hypertension. Increased diffuse mesenteric edema and mild ascites is seen since prior study.  Tiny calcified gallstones again noted, however there is no evidence of cholecystitis. The pancreas, adrenal glands, and kidneys are  normal in appearance, except for a tiny nonobstructing calculus in the lower pole of the left kidney. No evidence of hydronephrosis.  No soft tissue masses or lymphadenopathy identified. No focal inflammatory process or abscess identified. No evidence of dilated bowel loops or hernia.  IMPRESSION: Increased mild ascites and diffuse mesenteric edema.  Stable appearance of hepatic cirrhosis, marked splenomegaly, and prominent left abdominal and pelvic venous collaterals, consistent with portal venous hypertension. No hepatic neoplasm visualized.  Nonobstructive left nephrolithiasis.  No evidence of hydronephrosis.  Cholelithiasis. No radiographic evidence of cholecystitis.   Electronically Signed   By: Myles Rosenthal M.D.   On: 07/22/2013 21:17   Dg Abd Acute W/chest  07/22/2013   CLINICAL DATA:  Abdominal pain. Chest pain. Cough. Vomiting. Diarrhea. Dyspnea. Cirrhosis.  EXAM: ACUTE ABDOMEN SERIES (ABDOMEN 2 VIEW & CHEST 1 VIEW)  COMPARISON:  04/07/2013 and 03/22/2013  FINDINGS: There is no evidence of dilated bowel loops or free intraperitoneal air. No radiopaque calculi or other significant radiographic abnormality is seen. Heart size and mediastinal contours are within normal limits. Both lungs are clear.  IMPRESSION: Negative abdominal radiographs.  No acute cardiopulmonary disease.   Electronically Signed   By: Myles Rosenthal M.D.   On: 07/22/2013 19:59    Anti-infectives: Anti-infectives   Start     Dose/Rate Route Frequency Ordered Stop   07/22/13 2015  piperacillin-tazobactam (ZOSYN) IVPB 3.375 g     3.375 g 12.5 mL/hr over 240 Minutes Intravenous  Once 07/22/13 2011 07/22/13 2311  Assessment/Plan: Hepatitis C, Cirrhosis([facility])  ETOH abuse  Chronic RUQ abdominal pain  Cholelithiasis   No leukocytosis, fever, tachycardia.  Exam is unchanged.  Await results of [place] of abdomen, doubt it is acute cholecystitis.  However, if he indeed has acute cholecystitis we may place a perc drain with  previous recommendation of referral to tertiary care facility.      LOS: 2 days    Dametri Ozburn ANP-BC 07/24/2013 8:43 AM

## 2013-07-24 NOTE — Progress Notes (Addendum)
Ultrasound negative for cholecystitis.  US Abdomen Complete  07/24/2013   CLINICAL DATA:  Right upper quadrant abdominal pain, cirrhosis, splenomegaly  EXAM: ULTRASOUND ABDOMEN COMPLETE  COMPARISON:  CT abdomen pelvis dated 07/22/2013  FINDINGS: Gallbladder:  Cholelithiasis, measuring up to 2.1 cm. No gallbladder wall thickening or pericholecystic fluid. Negative sonographic Murphy's sign.  Common bile duct:  Diameter: 4 mm.  Poorly visualized.  Liver:  Nodular hepatic contour with coarse echotexture, compatible with cirrhosis. No focal hepatic lesion is seen.  IVC:  No abnormality visualized.  Pancreas:  Not visualized due to overlying bowel gas.  Spleen:  Enlarged, measuring 21.6 x 22.5 x 14.2 cm (calculated volume 3641 mL). Perisplenic varices.  Right Kidney:  Length: 12.8 cm.  No mass or hydronephrosis.  Left Kidney:  Length: 13.9 cm.  No mass or hydronephrosis.  Abdominal aorta:  Not visualized due to overlying bowel gas.  Other findings:  Ascites.  IMPRESSION: Cholelithiasis, without associated findings to suggest acute cholecystitis.  Cirrhosis.  No focal hepatic lesion is seen.  Splenomegaly with perisplenic varices.   Electronically Signed   By: Charline Bills M.D.   On: 07/24/2013 09:48   No surgical indications at this time.  Abdominal pain likely from liver disease.  If his symptoms can be controlled, he should follow-up again with his liver doctor in [place].  You may reconsult surgery if needed, but the patient is not really a surgical candidate at this hospital.  Wilmon Arms. Corliss Skains, MD, [facility]  General/ Trauma Surgery  07/24/2013 10:09 AM

## 2013-07-24 NOTE — Progress Notes (Signed)
Pt stated "feeling itchy while in bed and in gown, and itching increased after new sheets put on". Pt sitting in chair and stated "does not feel itchy when out of sheets and out of gown". Called laundry and special sheets and gown placed in patient's room that are not washed in typical detergent. Will continue to monitor.

## 2013-07-25 LAB — COMPREHENSIVE METABOLIC PANEL
ALT: 91 U/L — ABNORMAL HIGH (ref 0–53)
AST: 229 U/L — ABNORMAL HIGH (ref 0–37)
Albumin: 2.4 g/dL — ABNORMAL LOW (ref 3.5–5.2)
BUN: 19 mg/dL (ref 6–23)
Calcium: 8.3 mg/dL — ABNORMAL LOW (ref 8.4–10.5)
Chloride: 102 mEq/L (ref 96–112)
Creatinine, Ser: 0.82 mg/dL (ref 0.50–1.35)
GFR calc Af Amer: >90 mL/min (ref >90)
Total Bilirubin: 1.6 mg/dL — ABNORMAL HIGH (ref 0.3–1.2)

## 2013-07-25 LAB — URINE CULTURE: Culture: NO GROWTH

## 2013-07-25 LAB — GLUCOSE, CAPILLARY: Glucose-Capillary: 141 mg/dL — ABNORMAL HIGH (ref 70–99)

## 2013-07-25 MED ORDER — LACTULOSE 10 GM/15ML PO SOLN: 30.0000 g | Freq: Every day | ORAL | Status: DC | PRN

## 2013-07-25 MED ORDER — LACTULOSE 10 GM/15ML PO SOLN
30.0000 g | Freq: Every day | ORAL | Status: DC | PRN
  Filled 2013-07-25: qty 45

## 2013-07-25 MED ORDER — OXYCODONE HCL 5 MG PO TABS: 10.0000 mg | ORAL_TABLET | ORAL | Status: DC | PRN

## 2013-07-25 MED ORDER — FUROSEMIDE 40 MG PO TABS: 20.0000 mg | ORAL_TABLET | Freq: Every day | ORAL | Status: DC

## 2013-07-25 MED ORDER — SPIRONOLACTONE 100 MG PO TABS: 50.0000 mg | ORAL_TABLET | Freq: Every day | ORAL | Status: DC

## 2013-07-25 MED ORDER — ESOMEPRAZOLE MAGNESIUM 40 MG PO CPDR: 40.0000 mg | DELAYED_RELEASE_CAPSULE | Freq: Every day | ORAL | Status: DC

## 2013-07-25 MED ORDER — CIPROFLOXACIN HCL 500 MG PO TABS
500.0000 mg | ORAL_TABLET | Freq: Two times a day (BID) | ORAL | Status: DC
  Administered 2013-07-25: 13:00:00 500 mg via ORAL
  Filled 2013-07-25 (×3): qty 1

## 2013-07-25 MED ORDER — CIPROFLOXACIN HCL 500 MG PO TABS: 500.0000 mg | ORAL_TABLET | Freq: Two times a day (BID) | ORAL | Status: DC

## 2013-07-25 MED ORDER — OXYCODONE HCL 5 MG PO TABS
5.0000 mg | ORAL_TABLET | ORAL | Status: DC | PRN
  Administered 2013-07-25: 10 mg via ORAL
  Filled 2013-07-25: qty 2

## 2013-07-25 NOTE — Discharge Summary (Signed)
Physician Discharge Summary  [name] ZOX:[id number] DOB: 1960/11/08 DOA: 07/22/2013  PCP: Cory Ora, PA-C  Admit date: 07/22/2013 Discharge date: 07/25/2013  Time spent: 50 minutes  Recommendations for Outpatient Follow-up:  CM[facility] at [facility] later this week.  Patient with elevated LFTs. Was started on Diuretics this admission Monitor CBC - Pancytopenia Metformin discontinued, please monitor DM. He will need follow up with Dr. Darrick Penna, GI in the next 2 weeks.  For Liver failure and Hep C.  Poor surgical candidate.  Discharge Diagnoses:  Principal Problem:   Cholecystitis, acute with cholelithiasis Active Problems:   HEPATITIS C, CHRONIC   ALCOHOL ABUSE   CIRRHOSIS   Biliary colic   Discharge Condition: stable.    Diet recommendation: low salt.  Filed Weights   07/22/13 1748 07/23/13 0131  Weight: 126.1 kg (278 lb) 126.871 kg (279 lb 11.2 oz)    History of present illness:  [name] has a hx of Hep C Cirrhosis, and presented with 4 days of right upper quadrant pain with vomiting which was worse after eating.  Hospital Course:   RUQ abdominal pain  -CT scan showed cholelithiasis without evidence of acute cholecystitis.  -No leukocytosis or fever.  [facility] Surgery consulted and felt the patient was a poor candidate for surgery.  Further, there was no acute indication for surgery. -Could be secondary to hepatomegaly/hepatic congestion and stretching of the liver capsule.  -Ultrasound is negative.  -Patient was started on low dose lasix and spironolactone for diuresis.  Will have CMET checked 12/17. -Started on PRN lactulose for constipation and possible mild encephalopathy.  Possible UTI -Urine showed 21-50 white blood cells without nitrite, LE or bacteria, but is still pyuria is not normal.  -received 1 dose of Zosyn on admission.  Will be discharged with three days of oral Cipro.  Liver cirrhosis  -Secondary to alcohol  abuse/chronic hepatitis C. With preserved INR.  -MELD score of 12  -No encephalopathy.  -Has hypoalbuminemia, transaminitis, hyperbilirubinemia, thrombocytopenia and a slight hyperammonemia.  -started Aldactone and Lasix.  -Recommend he recheck with Hep C clinic to see if new treatments will help him and not cause depression - we are uncertain of when he was seen at the Hep C clinic.  Diabetes mellitus  -As an inpatient he received a Carbohydrate modified diet, and insulin sliding scale. -Due to liver failure we will stop metformin.  Continue lantus and glipizide  Pancytopenia  -Secondary to chronic liver disease, patient has hypersplenism as well.  Consultations:  General Surgery.  Discharge Exam: Filed Vitals:   07/25/13 1342  BP: 125/71  Pulse: 74  Temp: 97.8 F (36.6 C)  Resp: 20    General: A&O, NAD, Sitting on the side of the bed. Cardiovascular: rrr, +systolic murmur, no rubs or gallops. Respiratory: CTA no w/c/r Abdomen: soft, nt, obese, No obvious masses.  Discharge Instructions      Discharge Orders   Future Appointments Provider Department Dept Phone   09/29/2013 1:30 PM [facility] Covering Provider [facility] [phone]   Future Orders Complete By Expires   Diet - low sodium heart healthy  As directed    Increase activity slowly  As directed        Medication List    STOP taking these medications       lisinopril 40 MG tablet  Commonly known as:  PRINIVIL,ZESTRIL     metFORMIN 1000 MG tablet  Commonly known as:  GLUCOPHAGE  TAKE these medications       albuterol 108 (90 BASE) MCG/ACT inhaler  Commonly known as:  PROVENTIL HFA;VENTOLIN HFA  Inhale 2 puffs into the lungs every 6 (six) hours as needed. Shortness of breath     ciprofloxacin 500 MG tablet  Commonly known as:  CIPRO  Take 1 tablet (500 mg total) by mouth 2 (two) times daily.     esomeprazole 40 MG capsule  Commonly known as:  NEXIUM  Take 1 capsule (40 mg  total) by mouth daily.     furosemide 40 MG tablet  Commonly known as:  LASIX  Take 0.5 tablets (20 mg total) by mouth daily.     glipiZIDE 10 MG tablet  Commonly known as:  GLUCOTROL  Take 10 mg by mouth 2 (two) times daily before a meal.     insulin glargine 100 UNIT/ML injection  Commonly known as:  LANTUS  Inject 10 Units into the skin at bedtime.     lactulose 10 GM/15ML solution  Commonly known as:  CHRONULAC  Take 45 mLs (30 g total) by mouth daily as needed for mild constipation. As well as confusion or sleepiness.     oxyCODONE 5 MG immediate release tablet  Commonly known as:  Oxy IR/ROXICODONE  Take 2 tablets (10 mg total) by mouth every 4 (four) hours as needed for severe pain. Do not take more than 2 tablets every four hours.  Take only if you have pain.     spironolactone 100 MG tablet  Commonly known as:  ALDACTONE  Take 0.5 tablets (50 mg total) by mouth daily.       No Known Allergies Follow-up Information   Follow up with Cory Ora, PA-C. Schedule an appointment as soon as possible for a visit on 07/25/2013. (You will need to go to the [place] office. )    Specialty:  Physician Assistant   Contact information:   [facility] [address] [place] [address] [phone]       Follow up with Cory Eva, MD. Schedule an appointment as soon as possible for a visit in 2 weeks.   Specialty:  Gastroenterology   Contact information:   [address] [place] [address] [phone]        The results of significant diagnostics from this hospitalization (including imaging, microbiology, ancillary and laboratory) are listed below for reference.    Significant Diagnostic Studies: US Abdomen Complete  07/24/2013   CLINICAL DATA:  Right upper quadrant abdominal pain, cirrhosis, splenomegaly  EXAM: ULTRASOUND ABDOMEN COMPLETE  COMPARISON:  CT abdomen pelvis dated 07/22/2013   FINDINGS: Gallbladder:  Cholelithiasis, measuring up to 2.1 cm. No gallbladder wall thickening or pericholecystic fluid. Negative sonographic Murphy's sign.  Common bile duct:  Diameter: 4 mm.  Poorly visualized.  Liver:  Nodular hepatic contour with coarse echotexture, compatible with cirrhosis. No focal hepatic lesion is seen.  IVC:  No abnormality visualized.  Pancreas:  Not visualized due to overlying bowel gas.  Spleen:  Enlarged, measuring 21.6 x 22.5 x 14.2 cm (calculated volume 3641 mL). Perisplenic varices.  Right Kidney:  Length: 12.8 cm.  No mass or hydronephrosis.  Left Kidney:  Length: 13.9 cm.  No mass or hydronephrosis.  Abdominal aorta:  Not visualized due to overlying bowel gas.  Other findings:  Ascites.  IMPRESSION: Cholelithiasis, without associated findings to suggest acute cholecystitis.  Cirrhosis.  No focal hepatic lesion is seen.  Splenomegaly with perisplenic varices.   Electronically Signed   By: Charline Bills M.D.   On: 07/24/2013 09:48   US Abdomen Complete  07/05/2013   CLINICAL DATA:  Chronic hepatitis, splenomegaly, cirrhosis, hypersplenism, increased right upper quadrant pain, history GERD, cholelithiasis  EXAM: ULTRASOUND ABDOMEN COMPLETE  COMPARISON:  CT abdomen and pelvis 04/07/2013  FINDINGS: Gallbladder  Gallstone identified 2 cm diameter. Multiple gallbladder polyps noted. Additional echogenic focus intraluminal, not definitely mobile 19 mm question fixed non shadowing stone versus sludge ball.  Mild gallbladder wall thickening. No sonographic Murphy sign or pericholecystic fluid.  Common bile duct  Diameter: 5 mm diameter, normal  Liver  Cirrhotic appearing liver with nodular margins. No discrete focal hepatic mass identified. Hepatopetal portal venous flow. Visualized hepatic veins appear patent.  IVC  No abnormality visualized.  Pancreas  Obscured by bowel gas  Spleen  Enlarged, 20.9 cm length with calculated volume 3518 mL. No focal mass. Perisplenic varices noted.   Right kidney: 12.3 length. Normal morphology without mass or hydronephrosis.  Left Kidney  Length: 13.4 cm.  Normal morphology without mass or hydronephrosis.  Aorta: Obscured at mid and distal portions by bowel gas, visualized proximal portion normal caliber  Ascites is present.  IMPRESSION: Cirrhotic appearing liver with splenomegaly, perisplenic varices, and ascites.  Thickened gallbladder wall with gallstones, polyps and question small sludge ball versus additional fixed stone.  While no definite sonographic Murphy sign or pericholecystic fluid are identified, early acute cholecystitis not excluded. Abdominal aorta   Electronically Signed   By: Ulyses Southward M.D.   On: 07/05/2013 11:58   Ct Abdomen Pelvis W Contrast  07/22/2013   CLINICAL DATA:  Abdominal pain. Vomiting. Diarrhea. Cholelithiasis. Cirrhosis.  EXAM: CT ABDOMEN AND PELVIS WITH CONTRAST  TECHNIQUE: Multidetector CT imaging of the abdomen and pelvis was performed using the standard protocol following bolus administration of intravenous contrast.  CONTRAST:  OMNIPAQUE IOHEXOL 300 MG/ML  SOLN  COMPARISON:  04/07/2013  FINDINGS: Hepatic cirrhosis again demonstrated. No liver masses are identified. Marked splenomegaly again seen, as well as prominent left abdominal and pelvic venous collaterals, consistent with portal venous hypertension. Increased diffuse mesenteric edema and mild ascites is seen since prior study.  Tiny calcified gallstones again noted, however there is no evidence of cholecystitis. The pancreas, adrenal glands, and kidneys are normal in appearance, except for a tiny nonobstructing calculus in the lower pole of the left kidney. No evidence of hydronephrosis.  No soft tissue masses or lymphadenopathy identified. No focal inflammatory process or abscess identified. No evidence of dilated bowel loops or hernia.  IMPRESSION: Increased mild ascites and diffuse mesenteric edema.  Stable appearance of hepatic cirrhosis, marked  splenomegaly, and prominent left abdominal and pelvic venous collaterals, consistent with portal venous hypertension. No hepatic neoplasm visualized.  Nonobstructive left nephrolithiasis.  No evidence of hydronephrosis.  Cholelithiasis. No radiographic evidence of cholecystitis.   Electronically Signed   By: Myles Rosenthal M.D.   On: 07/22/2013 21:17   Dg Abd Acute W/chest  07/22/2013   CLINICAL DATA:  Abdominal pain. Chest pain. Cough. Vomiting. Diarrhea. Dyspnea. Cirrhosis.  EXAM: ACUTE ABDOMEN SERIES (ABDOMEN 2 VIEW & CHEST 1 VIEW)  COMPARISON:  04/07/2013 and 03/22/2013  FINDINGS: There is no evidence of dilated bowel loops or free intraperitoneal air. No radiopaque calculi or other significant radiographic abnormality is seen. Heart size and mediastinal contours are within normal limits. Both lungs are clear.  IMPRESSION: Negative abdominal radiographs.  No acute cardiopulmonary disease.   Electronically  Signed   By: Myles Rosenthal M.D.   On: 07/22/2013 19:59    Microbiology: Recent Results (from the past 240 hour(s))  URINE CULTURE     Status: None   Collection Time    07/22/13  7:13 PM      Result Value Range Status   Specimen Description     Final   Value: URINE, RANDOM     Performed at [facility]   Special Requests     Final   Value: NONE     Performed at [facility]   Culture  Setup Time     Final   Value: 07/24/2013 21:37     Performed at [facility] Count     Final   Value: 40,000 COLONIES/ML     Performed at [facility]   Culture     Final   Value: STAPHYLOCOCCUS AUREUS     Note: RIFAMPIN AND GENTAMICIN SHOULD NOT BE USED AS SINGLE DRUGS FOR TREATMENT OF STAPH INFECTIONS.     Performed at [facility]   Report Status PENDING   Incomplete     Labs: Basic Metabolic Panel:  Recent Labs Lab 07/22/13 1837 07/23/13 0400 07/24/13 0500 07/25/13 0420  NA 137 136 136 135  K 3.8 3.7 4.2 4.2  CL 106 106 104 102  CO2 21 21 25  26   GLUCOSE 82 75 122* 101*  BUN 12 13 18 19   CREATININE 0.81 0.78 0.87 0.82  CALCIUM 8.9 7.9* 8.0* 8.3*   Liver Function Tests:  Recent Labs Lab 07/22/13 1837 07/23/13 0400 07/25/13 0420  AST 210* 184* 229*  ALT 100* 84* 91*  ALKPHOS 136* 113 110  BILITOT 2.2* 2.2* 1.6*  PROT 7.4 6.2 6.2  ALBUMIN 2.9* 2.5* 2.4*    Recent Labs Lab 07/22/13 1837  LIPASE 96*    Recent Labs Lab 07/22/13 2328  AMMONIA 79*   CBC:  Recent Labs Lab 07/22/13 1837 07/23/13 0400 07/24/13 0500  WBC 4.2 3.6* 2.8*  NEUTROABS 2.7  --   --   HGB 11.5* 9.8* 9.8*  HCT 33.4* 29.0* 29.2*  MCV 94.9 95.4 95.4  PLT 69* 63* 56*   Cardiac Enzymes:  Recent Labs Lab 07/22/13 1837  TROPONINI <0.30   CBG:  Recent Labs Lab 07/24/13 1202 07/24/13 1707 07/24/13 2125 07/25/13 0754 07/25/13 1148  GLUCAP 144* 135* 133* 97 141*       SignedConley Canal [phone]  [facility] 07/25/2013, 2:40 PM

## 2013-07-25 NOTE — Discharge Summary (Signed)
Addendum  Patient seen and examined, chart and data base reviewed.  I agree with the above assessment and plan.  For full details please see Mrs. Algis Downs PA note.  Right upper quadrant abdominal pain followup with primary care physician.  Cholelithiasis, not sure if that is causing his RUQ abdominal pain.   Clint Lipps, MD [facility] Pager: [phone] 07/25/2013, 2:52 PM

## 2013-07-25 NOTE — Progress Notes (Signed)
NURSING PROGRESS NOTE  DERION KREITER [id number] Discharge Data: 07/25/2013 3:24 PM Attending Provider: Clydia Llano, MD JYN:[id number], Tania Ade, PA-C     Eppie Gibson Hodsdon to be D/C'd Home per MD order.    All IV's discontinued with no bleeding noted.  All belongings returned to patient for patient to take home.   Last Vital Signs:  Blood pressure 125/71, pulse 74, temperature 97.8 F (36.6 C), temperature source Oral, resp. rate 20, height 5\' 9"  (1.753 m), weight 126.871 kg (279 lb 11.2 oz), SpO2 99.00%.  Discharge Medication List   Medication List    STOP taking these medications       lisinopril 40 MG tablet  Commonly known as:  PRINIVIL,ZESTRIL     metFORMIN 1000 MG tablet  Commonly known as:  GLUCOPHAGE      TAKE these medications       albuterol 108 (90 BASE) MCG/ACT inhaler  Commonly known as:  PROVENTIL HFA;VENTOLIN HFA  Inhale 2 puffs into the lungs every 6 (six) hours as needed. Shortness of breath     ciprofloxacin 500 MG tablet  Commonly known as:  CIPRO  Take 1 tablet (500 mg total) by mouth 2 (two) times daily.     esomeprazole 40 MG capsule  Commonly known as:  NEXIUM  Take 1 capsule (40 mg total) by mouth daily.     furosemide 40 MG tablet  Commonly known as:  LASIX  Take 0.5 tablets (20 mg total) by mouth daily.     glipiZIDE 10 MG tablet  Commonly known as:  GLUCOTROL  Take 10 mg by mouth 2 (two) times daily before a meal.     insulin glargine 100 UNIT/ML injection  Commonly known as:  LANTUS  Inject 10 Units into the skin at bedtime.     lactulose 10 GM/15ML solution  Commonly known as:  CHRONULAC  Take 45 mLs (30 g total) by mouth daily as needed for mild constipation. As well as confusion or sleepiness.     oxyCODONE 5 MG immediate release tablet  Commonly known as:  Oxy IR/ROXICODONE  Take 2 tablets (10 mg total) by mouth every 4 (four) hours as needed for severe pain. Do not take more than 2 tablets every four hours.  Take only if you  have pain.     spironolactone 100 MG tablet  Commonly known as:  ALDACTONE  Take 0.5 tablets (50 mg total) by mouth daily.        Madelin Rear, MSN, RN, [employer]

## 2013-07-26 LAB — URINE CULTURE: Colony Count: 40000

## 2013-07-26 NOTE — Progress Notes (Signed)
UR completed. Kirrah Mustin RN CCM Case Mgmt phone [phone] 

## 2013-07-26 NOTE — Care Management Note (Signed)
    Page 1 of 1   07/26/2013     2:12:36 PM   CARE MANAGEMENT NOTE 07/26/2013  Patient:  [name], [name]   Account Number:  [id number]  Date Initiated:  07/26/2013  Documentation initiated by:  [facility]  Subjective/Objective Assessment:   right upper quadrant pain, hx cholelithiasis, cirrhosis     Action/Plan:   Anticipated DC Date:  07/25/2013   Anticipated DC Plan:  HOME/SELF CARE      DC Planning Services  CM consult      Choice offered to / List presented to:             Status of service:  Completed, signed off Medicare Important Message given?   (If response is "NO", the following Medicare IM given date fields will be blank) Date Medicare IM given:   Date Additional Medicare IM given:    Discharge Disposition:  HOME/SELF CARE  Per UR Regulation:  Reviewed for med. necessity/level of care/duration of stay  If discussed at Long Length of Stay Meetings, dates discussed:    Comments:  07/26/13 14:11 Letha Cape RN, BSN [phone] pt dc to home, no NCM referral, no needs anticipated.  07/26/2013 1130 UR completed. Isidoro Donning RN CCM Case Mgmt phone [phone]

## 2013-07-28 ENCOUNTER — Encounter: Payer: Self-pay | Admitting: Internal Medicine

## 2013-08-31 ENCOUNTER — Ambulatory Visit: Payer: Self-pay | Admitting: Gastroenterology

## 2013-09-14 ENCOUNTER — Ambulatory Visit: Payer: Self-pay | Admitting: Gastroenterology

## 2013-09-16 ENCOUNTER — Ambulatory Visit: Payer: Self-pay | Admitting: Gastroenterology

## 2013-09-29 ENCOUNTER — Encounter (HOSPITAL_COMMUNITY): Payer: Medicaid Other

## 2013-09-29 ENCOUNTER — Encounter (HOSPITAL_COMMUNITY): Payer: Self-pay

## 2013-09-29 ENCOUNTER — Encounter (HOSPITAL_COMMUNITY): Payer: Self-pay | Attending: Hematology and Oncology

## 2013-09-29 VITALS — BP 146/73 | HR 74 | Temp 97.6°F | Resp 22 | Wt 286.3 lb

## 2013-09-29 DIAGNOSIS — D509 Iron deficiency anemia, unspecified: Secondary | ICD-10-CM | POA: Insufficient documentation

## 2013-09-29 DIAGNOSIS — J4489 Other specified chronic obstructive pulmonary disease: Secondary | ICD-10-CM | POA: Insufficient documentation

## 2013-09-29 DIAGNOSIS — D61818 Other pancytopenia: Secondary | ICD-10-CM

## 2013-09-29 DIAGNOSIS — J449 Chronic obstructive pulmonary disease, unspecified: Secondary | ICD-10-CM | POA: Insufficient documentation

## 2013-09-29 DIAGNOSIS — K7460 Unspecified cirrhosis of liver: Secondary | ICD-10-CM | POA: Insufficient documentation

## 2013-09-29 DIAGNOSIS — F1010 Alcohol abuse, uncomplicated: Secondary | ICD-10-CM | POA: Insufficient documentation

## 2013-09-29 DIAGNOSIS — E669 Obesity, unspecified: Secondary | ICD-10-CM | POA: Insufficient documentation

## 2013-09-29 DIAGNOSIS — K219 Gastro-esophageal reflux disease without esophagitis: Secondary | ICD-10-CM | POA: Insufficient documentation

## 2013-09-29 DIAGNOSIS — D731 Hypersplenism: Secondary | ICD-10-CM

## 2013-09-29 DIAGNOSIS — I10 Essential (primary) hypertension: Secondary | ICD-10-CM | POA: Insufficient documentation

## 2013-09-29 DIAGNOSIS — F1210 Cannabis abuse, uncomplicated: Secondary | ICD-10-CM | POA: Insufficient documentation

## 2013-09-29 DIAGNOSIS — Z09 Encounter for follow-up examination after completed treatment for conditions other than malignant neoplasm: Secondary | ICD-10-CM | POA: Insufficient documentation

## 2013-09-29 DIAGNOSIS — E119 Type 2 diabetes mellitus without complications: Secondary | ICD-10-CM | POA: Insufficient documentation

## 2013-09-29 DIAGNOSIS — Z6841 Body Mass Index (BMI) 40.0 and over, adult: Secondary | ICD-10-CM | POA: Insufficient documentation

## 2013-09-29 DIAGNOSIS — F1410 Cocaine abuse, uncomplicated: Secondary | ICD-10-CM | POA: Insufficient documentation

## 2013-09-29 DIAGNOSIS — R1011 Right upper quadrant pain: Secondary | ICD-10-CM

## 2013-09-29 DIAGNOSIS — B1920 Unspecified viral hepatitis C without hepatic coma: Secondary | ICD-10-CM | POA: Insufficient documentation

## 2013-09-29 DIAGNOSIS — R161 Splenomegaly, not elsewhere classified: Secondary | ICD-10-CM | POA: Insufficient documentation

## 2013-09-29 LAB — COMPREHENSIVE METABOLIC PANEL
ALT: 141 U/L — ABNORMAL HIGH (ref 0–53)
AST: 302 U/L — AB (ref 0–37)
Albumin: 2.9 g/dL — ABNORMAL LOW (ref 3.5–5.2)
Alkaline Phosphatase: 152 U/L — ABNORMAL HIGH (ref 39–117)
BUN: 15 mg/dL (ref 6–23)
CALCIUM: 8.1 mg/dL — AB (ref 8.4–10.5)
CO2: 24 mEq/L (ref 19–32)
CREATININE: 0.76 mg/dL (ref 0.50–1.35)
Chloride: 108 mEq/L (ref 96–112)
GFR calc Af Amer: >90 mL/min (ref >90)
GFR calc non Af Amer: >90 mL/min (ref >90)
Glucose, Bld: 83 mg/dL (ref 70–99)
Potassium: 3.9 mEq/L (ref 3.7–5.3)
SODIUM: 141 meq/L (ref 137–147)
TOTAL PROTEIN: 7.8 g/dL (ref 6.0–8.3)
Total Bilirubin: 1.6 mg/dL — ABNORMAL HIGH (ref 0.3–1.2)

## 2013-09-29 LAB — CBC WITH DIFFERENTIAL/PLATELET
Basophils Absolute: 0 10*3/uL (ref 0.0–0.1)
Basophils Relative: 0 % (ref 0–1)
EOS PCT: 3 % (ref 0–5)
Eosinophils Absolute: 0.1 10*3/uL (ref 0.0–0.7)
HCT: 32 % — ABNORMAL LOW (ref 39.0–52.0)
HEMOGLOBIN: 10.9 g/dL — AB (ref 13.0–17.0)
Lymphocytes Relative: 25 % (ref 12–46)
Lymphs Abs: 1.1 10*3/uL (ref 0.7–4.0)
MCH: 31.9 pg (ref 26.0–34.0)
MCHC: 34.1 g/dL (ref 30.0–36.0)
MCV: 93.6 fL (ref 78.0–100.0)
MONOS PCT: 14 % — AB (ref 3–12)
Monocytes Absolute: 0.6 10*3/uL (ref 0.1–1.0)
NEUTROS PCT: 58 % (ref 43–77)
Neutro Abs: 2.7 10*3/uL (ref 1.7–7.7)
Platelets: 78 10*3/uL — ABNORMAL LOW (ref 150–400)
RBC: 3.42 MIL/uL — AB (ref 4.22–5.81)
RDW: 13.9 % (ref 11.5–15.5)
WBC: 4.5 10*3/uL (ref 4.0–10.5)

## 2013-09-29 LAB — AMMONIA: Ammonia: 82 umol/L — ABNORMAL HIGH (ref 11–60)

## 2013-09-29 MED ORDER — OXYCODONE HCL 5 MG PO TABS: ORAL_TABLET | ORAL | Status: DC

## 2013-09-29 NOTE — Patient Instructions (Addendum)
[place] Discharge Instructions  RECOMMENDATIONS MADE BY THE CONSULTANT AND ANY TEST RESULTS WILL BE SENT TO YOUR REFERRING PHYSICIAN.  EXAM FINDINGS BY THE PHYSICIAN TODAY AND SIGNS OR SYMPTOMS TO REPORT TO CLINIC OR PRIMARY PHYSICIAN: Exam and findings as discussed by Dr. Barnet Glasgow.  Keep the appointment with the liver specialist.  MEDICATIONS PRESCRIBED:  oxycodone - take as directed  INSTRUCTIONS/FOLLOW-UP: Blood work and office visit in 3 months.  Thank you for choosing [facility] to provide your oncology and hematology care.  To afford each patient quality time with our providers, please arrive at least 15 minutes before your scheduled appointment time.  With your help, our goal is to use those 15 minutes to complete the necessary work-up to ensure our physicians have the information they need to help with your evaluation and healthcare recommendations.    Effective January 1st, 2014, we ask that you re-schedule your appointment with our physicians should you arrive 10 or more minutes late for your appointment.  We strive to give you quality time with our providers, and arriving late affects you and other patients whose appointments are after yours.    Again, thank you for choosing [facility].  Our hope is that these requests will decrease the amount of time that you wait before being seen by our physicians.       _____________________________________________________________  Should you have questions after your visit to [facility], please contact our office at [phone] between the hours of 8:30 a.m. and 5:00 p.m.  Voicemails left after 4:30 p.m. will not be returned until the following business day.  For prescription refill requests, have your pharmacy contact our office with your prescription refill request.

## 2013-09-29 NOTE — Addendum Note (Signed)
Addended by: Mellissa Kohut on: 09/29/2013 03:24 PM   Modules accepted: Orders

## 2013-09-29 NOTE — Progress Notes (Signed)
[place]  OFFICE PROGRESS NOTE  Jacqualine Mau, PA-C [facility] [address] [place] [address]  DIAGNOSIS: Iron (Fe) deficiency anemia - Plan: CBC with Differential, Ferritin, Transferrin Receptor, Soluable, Beta 2 microglobuline, serum, Comprehensive metabolic panel, Ammonia, furosemide (LASIX) 20 MG tablet, oxyCODONE (OXY IR/ROXICODONE) 5 MG immediate release tablet, CBC with Differential, Ferritin, Transferrin Receptor, Soluable, Beta 2 microglobuline, serum, Comprehensive metabolic panel, Ammonia, Miscellaneous test, Miscellaneous test, CANCELED: HCG, tumor marker  Hypersplenism - Plan: CBC with Differential, Ferritin, Transferrin Receptor, Soluable, Beta 2 microglobuline, serum, Comprehensive metabolic panel, Ammonia, furosemide (LASIX) 20 MG tablet, oxyCODONE (OXY IR/ROXICODONE) 5 MG immediate release tablet, CBC with Differential, Ferritin, Transferrin Receptor, Soluable, Beta 2 microglobuline, serum, Comprehensive metabolic panel, Ammonia, Miscellaneous test, Miscellaneous test  Cirrhosis of liver without mention of alcohol - Plan: CBC with Differential, Ferritin, Transferrin Receptor, Soluable, Beta 2 microglobuline, serum, Comprehensive metabolic panel, Ammonia, furosemide (LASIX) 20 MG tablet, oxyCODONE (OXY IR/ROXICODONE) 5 MG immediate release tablet, CBC with Differential, Ferritin, Transferrin Receptor, Soluable, Beta 2 microglobuline, serum, Comprehensive metabolic panel, Ammonia, Miscellaneous test, Miscellaneous test  Chief Complaint  Patient presents with  . Follow-up  . Abdominal Pain    CURRENT THERAPY: Monitoring CBC and iron levels.  INTERVAL HISTORY: [name] 54 y.o. male returns for followup of hepatitis C-induced cirrhosis with splenomegaly and chronic right upper quadrant pain with evidence of gallbladder polyps, gallstones, and kidney stones bilaterally. Workup for liver tumor  was negative.  He was evaluated in the emergency room in December and admitted. He was not being a good surgical candidate locally. His severe liver disease mitigated against doing any definitive operative procedure locally. [facility] recently and was referred to [name] also Dr. Patsy Baltimore, hepatologist, for consideration of definitive treatment for hepatitis C. He had been treated at [facility] about 2 years ago with interferon which was not well tolerated and did not produce the desired effect. He complains of right upper quadrant abdominal pain with abdominal distention but no vomiting. Does have occasional nausea without diarrhea, dysuria, melena, hematochezia, hematuria, or epistaxis. He suffers with generalized pruritus as well with lower extremity swelling that dissipates overnight. According to his wife he has not had periods of confusion.  MEDICAL HISTORY: Past Medical History  Diagnosis Date  . Hypertension   . Diabetes mellitus   . GERD (gastroesophageal reflux disease)     DEC 2010 EGD/Bx REACTIVE GASTROPATHY, NO VARICES  . Hemorrhoids, internal   . BMI 40.0-44.9, adult OCT 2010 269 LBS    APR 2012 279 LBS AUG 2014 185 LBS  . 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 IV drug abuse REMOTE  . Hepatitis 2010 HEP C    AST 509 ALT 267 ALK PHOS 165 ALB 3.8 NEG IGM HAV/HBSAg  . Gallstone AUG 2012 1 CM  . GERD 10/25/2009  . COPD (chronic obstructive pulmonary disease)   . Hepatitis C   . Pancytopenia 2013  . Splenomegaly 2013  . Other pancytopenia 02/18/2013  . Iron (Fe) deficiency anemia 02/18/2013  . Splenomegaly 02/18/2013    INTERIM HISTORY: has HEPATITIS C, CHRONIC; DM; ALCOHOL ABUSE; GERD; CIRRHOSIS; UNSPECIFIED DISEASE OF PANCREAS; FATIGUE; LIVER FUNCTION TESTS, ABNORMAL, HX OF; ABDOMINAL PAIN, CHRONIC; Gallstone; Colon cancer screening; Other pancytopenia; Iron (Fe) deficiency anemia; Splenomegaly; Right upper quadrant abdominal pain; Biliary colic; and  Cholecystitis, acute with cholelithiasis on  his problem list.    ALLERGIES:  has No Known Allergies.  MEDICATIONS: has a current medication list which includes the following prescription(s): albuterol, esomeprazole, furosemide, glipizide, insulin glargine, spironolactone, lactulose, and oxycodone.  SURGICAL HISTORY:  Past Surgical History  Procedure Laterality Date  . Sigmoidoscopy      2001 DR. FLEISCHMAN INTERNAL HERMORRHOIDS  . Upper gastrointestinal endoscopy  DEC 2010    BENIGN POLYPS, GASTRITIS, ?phg  . Knee surgery  RIGHT  . Hemorrhoid surgery    . Esophageal biopsy  09/08/2011    IZT:[id number] gastritis/Polyps, multiple in the body of the stomach    FAMILY HISTORY: family history includes Cancer in his father. There is no history of Colon cancer, Anesthesia problems, Hypotension, Malignant hyperthermia, or Pseudochol deficiency.  SOCIAL HISTORY:  reports that he has never smoked. He has never used smokeless tobacco. He reports that he uses illicit drugs (Marijuana and Cocaine). He reports that he does not drink alcohol.  REVIEW OF SYSTEMS:  Other than that discussed above is noncontributory.  PHYSICAL EXAMINATION: ECOG PERFORMANCE STATUS: 1 - Symptomatic but completely ambulatory  Blood pressure 146/73, pulse 74, temperature 97.6 F (36.4 C), temperature source Oral, resp. rate 22, weight 286 lb 4.8 oz (129.865 kg).  GENERAL:alert, no distress and comfortable SKIN: skin color, texture, turgor are normal, no rashes or significant lesions EYES: PERLA; Conjunctiva are pink and non-injected, sclera clear OROPHARYNX:no exudate, no erythema on lips, buccal mucosa, or tongue. NECK: supple, thyroid normal size, non-tender, without nodularity. No masses CHEST: Increased AP diameter with bilateral gynecomastia. Spine or angiomata are noted. LYMPH:  no palpable lymphadenopathy in the cervical, axillary or inguinal LUNGS: clear to auscultation and percussion with normal breathing  effort HEART: regular rate & rhythm and no murmurs. ABDOMEN: Distended with a positive fluid and shifting dullness. Right upper quadrant tenderness. Spleen ballotable 6 cm below left costal margin. MUSCULOSKELETAL:no cyanosis of digits and no clubbing. Range of motion normal. Distal stasis dermatitis changes.  NEURO: alert & oriented x 3 with fluent speech, no focal motor/sensory deficits. No evidence of asterixis.   LABORATORY DATA: Office Visit on 09/29/2013  Component Date Value Ref Range Status  . WBC 09/29/2013 4.5  4.0 - 10.5 K/uL Final  . RBC 09/29/2013 3.42* 4.22 - 5.81 MIL/uL Final  . Hemoglobin 09/29/2013 10.9* 13.0 - 17.0 g/dL Final  . HCT 09/29/2013 32.0* 39.0 - 52.0 % Final  . MCV 09/29/2013 93.6  78.0 - 100.0 fL Final  . MCH 09/29/2013 31.9  26.0 - 34.0 pg Final  . MCHC 09/29/2013 34.1  30.0 - 36.0 g/dL Final  . RDW 09/29/2013 13.9  11.5 - 15.5 % Final  . Platelets 09/29/2013 PENDING  150 - 400 K/uL Incomplete  . Neutrophils Relative % 09/29/2013 PENDING  43 - 77 % Incomplete  . Neutro Abs 09/29/2013 PENDING  1.7 - 7.7 K/uL Incomplete  . Band Neutrophils 09/29/2013 PENDING  0 - 10 % Incomplete  . Lymphocytes Relative 09/29/2013 PENDING  12 - 46 % Incomplete  . Lymphs Abs 09/29/2013 PENDING  0.7 - 4.0 K/uL Incomplete  . Monocytes Relative 09/29/2013 PENDING  3 - 12 % Incomplete  . Monocytes Absolute 09/29/2013 PENDING  0.1 - 1.0 K/uL Incomplete  . Eosinophils Relative 09/29/2013 PENDING  0 - 5 % Incomplete  . Eosinophils Absolute 09/29/2013 PENDING  0.0 - 0.7 K/uL Incomplete  . Basophils Relative 09/29/2013 PENDING  0 - 1 % Incomplete  . Basophils Absolute 09/29/2013 PENDING  0.0 - 0.1 K/uL Incomplete  .  WBC Morphology 09/29/2013 PENDING   Incomplete  . RBC Morphology 09/29/2013 PENDING   Incomplete  . Smear Review 09/29/2013 PENDING   Incomplete  . nRBC 09/29/2013 PENDING  0 /100 WBC Incomplete  . Metamyelocytes Relative 09/29/2013 PENDING   Incomplete  . Myelocytes  09/29/2013 PENDING   Incomplete  . Promyelocytes Absolute 09/29/2013 PENDING   Incomplete  . Blasts 09/29/2013 PENDING   Incomplete    PATHOLOGY: No new pathology  Urinalysis    Component Value Date/Time   COLORURINE YELLOW 07/24/2013 [place] 07/24/2013 1337   LABSPEC 1.009 07/24/2013 1337   PHURINE 5.0 07/24/2013 1337   GLUCOSEU NEGATIVE 07/24/2013 1337   HGBUR NEGATIVE 07/24/2013 [place] 07/24/2013 1337   KETONESUR NEGATIVE 07/24/2013 1337   PROTEINUR NEGATIVE 07/24/2013 1337   UROBILINOGEN 1.0 07/24/2013 1337   NITRITE NEGATIVE 07/24/2013 1337   LEUKOCYTESUR NEGATIVE 07/24/2013 1337    RADIOGRAPHIC STUDIES:   US Abdomen Complete Status: Final result         PACS Images    Show images for US Abdomen Complete         Study Result    CLINICAL DATA: Right upper quadrant abdominal pain, cirrhosis,  splenomegaly  EXAM:  ULTRASOUND ABDOMEN COMPLETE  COMPARISON: CT abdomen pelvis dated 07/22/2013  FINDINGS:  Gallbladder:  Cholelithiasis, measuring up to 2.1 cm. No gallbladder wall  thickening or pericholecystic fluid. Negative sonographic Murphy's  sign.  Common bile duct:  Diameter: 4 mm. Poorly visualized.  Liver:  Nodular hepatic contour with coarse echotexture, compatible with  cirrhosis. No focal hepatic lesion is seen.  IVC:  No abnormality visualized.  Pancreas:  Not visualized due to overlying bowel gas.  Spleen:  Enlarged, measuring 21.6 x 22.5 x 14.2 cm (calculated volume 3641  mL). Perisplenic varices.  Right Kidney:  Length: 12.8 cm. No mass or hydronephrosis.  Left Kidney:  Length: 13.9 cm. No mass or hydronephrosis.  Abdominal aorta:  Not visualized due to overlying bowel gas.  Other findings:  Ascites.  IMPRESSION:  Cholelithiasis, without associated findings to suggest acute  cholecystitis.  Cirrhosis. No focal hepatic lesion is seen.  Splenomegaly with perisplenic varices.  Electronically  Signed  By: Julian Hy M.D.  On: 07/24/2013 09:48      CT Abdomen Pelvis W Contrast Status: Final result         PACS Images    Show images for CT Abdomen Pelvis W Contrast         Study Result    CLINICAL DATA: Abdominal pain. Vomiting. Diarrhea. Cholelithiasis.  Cirrhosis.  EXAM:  CT ABDOMEN AND PELVIS WITH CONTRAST  TECHNIQUE:  Multidetector CT imaging of the abdomen and pelvis was performed  using the standard protocol following bolus administration of  intravenous contrast.  CONTRAST: 1104mL OMNIPAQUE IOHEXOL 300 MG/ML SOLN  COMPARISON: 04/07/2013  FINDINGS:  Hepatic cirrhosis again demonstrated. No liver masses are  identified. Marked splenomegaly again seen, as well as prominent  left abdominal and pelvic venous collaterals, consistent with portal  venous hypertension. Increased diffuse mesenteric edema and mild  ascites is seen since prior study.  Tiny calcified gallstones again noted, however there is no evidence  of cholecystitis. The pancreas, adrenal glands, and kidneys are  normal in appearance, except for a tiny nonobstructing calculus in  the lower pole of the left kidney. No evidence of hydronephrosis.  No soft tissue masses or lymphadenopathy identified. No focal  inflammatory process or abscess identified. No evidence of dilated  bowel loops or hernia.  IMPRESSION:  Increased mild ascites and diffuse mesenteric edema.  Stable appearance of hepatic cirrhosis, marked splenomegaly, and  prominent left abdominal and pelvic venous collaterals, consistent  with portal venous hypertension. No hepatic neoplasm visualized.  Nonobstructive left nephrolithiasis. No evidence of hydronephrosis.  Cholelithiasis. No radiographic evidence of cholecystitis.  Electronically Signed  By: Earle Gell M.D.  On: 07/22/2013 21:17     .  ASSESSMENT:  #1.#1. 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 #2. Right quadrant bowel pain probably secondary to stretching of the liver capsule.,, No evidence of hepatocellular carcinoma transformation. #3. History of iron deficiency, awaiting today's ferritin and Seidel transferrin receptor report, previous value 97 on 06/29/2013.    PLAN:  #1. Intravenous iron and ferritin or soluble transferrin receptor determination is consistent with iron deficiency. #2. Followup with D. Fields and Dr. Patsy Baltimore. #3. Oxycodone 10 mg up there at 4 hours a day for pain. #4. Followup in 3 months.   All questions were answered. The patient knows to call the clinic with any problems, questions or concerns. We can certainly see the patient much sooner if necessary.   I spent 25 minutes counseling the patient face to face. The total time spent in the appointment was 30 minutes.    Doroteo Bradford, MD 09/29/2013 3:04 PM

## 2013-09-29 NOTE — Progress Notes (Signed)
[name] presented for [employer]. Labs per MD order drawn via Peripheral Line 21 gauge needle inserted in right hand  Good blood return present. Procedure without incident.  Needle removed intact. Patient tolerated procedure well.

## 2013-09-30 ENCOUNTER — Other Ambulatory Visit (HOSPITAL_COMMUNITY): Payer: Self-pay | Admitting: Hematology and Oncology

## 2013-09-30 ENCOUNTER — Telehealth (HOSPITAL_COMMUNITY): Payer: Self-pay

## 2013-09-30 LAB — FERRITIN: FERRITIN: 22 ng/mL (ref 22–322)

## 2013-09-30 LAB — BETA 2 MICROGLOBULIN, SERUM: BETA 2 MICROGLOBULIN: 2.99 mg/L — AB (ref 1.01–1.73)

## 2013-09-30 NOTE — Telephone Encounter (Signed)
Spoke with wife and patient scheduled for feraheme infusion on 10/05/13.

## 2013-09-30 NOTE — Telephone Encounter (Signed)
Message copied by Mellissa Kohut on Fri Sep 30, 2013 10:17 AM ------      Message from: Farrel Gobble A      Created: Fri Sep 30, 2013  7:46 AM      Regarding: IV Maryan Rued has been ordered for 10/06/2013.  Please get authorization and schedule,   Thanks. Dr.F ------

## 2013-10-04 ENCOUNTER — Ambulatory Visit (HOSPITAL_COMMUNITY): Payer: Self-pay

## 2013-10-05 ENCOUNTER — Emergency Department (HOSPITAL_COMMUNITY): Payer: Medicaid Other

## 2013-10-05 ENCOUNTER — Ambulatory Visit: Payer: Self-pay | Admitting: Gastroenterology

## 2013-10-05 ENCOUNTER — Encounter (HOSPITAL_COMMUNITY): Payer: Medicaid Other

## 2013-10-05 ENCOUNTER — Emergency Department (HOSPITAL_COMMUNITY)
Admission: EM | Admit: 2013-10-05 | Discharge: 2013-10-05 | Disposition: A | Discharge status: Home / Self Care | Payer: Medicaid Other | Attending: Emergency Medicine | Admitting: Emergency Medicine

## 2013-10-05 ENCOUNTER — Other Ambulatory Visit: Payer: Self-pay

## 2013-10-05 ENCOUNTER — Telehealth: Payer: Self-pay | Admitting: General Practice

## 2013-10-05 ENCOUNTER — Telehealth: Payer: Self-pay | Admitting: Gastroenterology

## 2013-10-05 ENCOUNTER — Encounter (HOSPITAL_COMMUNITY): Payer: Self-pay | Admitting: Emergency Medicine

## 2013-10-05 DIAGNOSIS — R635 Abnormal weight gain: Secondary | ICD-10-CM | POA: Insufficient documentation

## 2013-10-05 DIAGNOSIS — I10 Essential (primary) hypertension: Secondary | ICD-10-CM | POA: Insufficient documentation

## 2013-10-05 DIAGNOSIS — B1920 Unspecified viral hepatitis C without hepatic coma: Secondary | ICD-10-CM | POA: Insufficient documentation

## 2013-10-05 DIAGNOSIS — Z79899 Other long term (current) drug therapy: Secondary | ICD-10-CM | POA: Insufficient documentation

## 2013-10-05 DIAGNOSIS — R609 Edema, unspecified: Secondary | ICD-10-CM | POA: Insufficient documentation

## 2013-10-05 DIAGNOSIS — R7989 Other specified abnormal findings of blood chemistry: Secondary | ICD-10-CM

## 2013-10-05 DIAGNOSIS — Z862 Personal history of diseases of the blood and blood-forming organs and certain disorders involving the immune mechanism: Secondary | ICD-10-CM | POA: Insufficient documentation

## 2013-10-05 DIAGNOSIS — K219 Gastro-esophageal reflux disease without esophagitis: Secondary | ICD-10-CM | POA: Insufficient documentation

## 2013-10-05 DIAGNOSIS — Z794 Long term (current) use of insulin: Secondary | ICD-10-CM | POA: Insufficient documentation

## 2013-10-05 DIAGNOSIS — E119 Type 2 diabetes mellitus without complications: Secondary | ICD-10-CM | POA: Insufficient documentation

## 2013-10-05 DIAGNOSIS — J441 Chronic obstructive pulmonary disease with (acute) exacerbation: Secondary | ICD-10-CM | POA: Insufficient documentation

## 2013-10-05 DIAGNOSIS — E7220 Disorder of urea cycle metabolism, unspecified: Secondary | ICD-10-CM | POA: Insufficient documentation

## 2013-10-05 DIAGNOSIS — R0600 Dyspnea, unspecified: Secondary | ICD-10-CM

## 2013-10-05 DIAGNOSIS — D509 Iron deficiency anemia, unspecified: Secondary | ICD-10-CM

## 2013-10-05 DIAGNOSIS — E663 Overweight: Secondary | ICD-10-CM | POA: Insufficient documentation

## 2013-10-05 DIAGNOSIS — R079 Chest pain, unspecified: Secondary | ICD-10-CM | POA: Insufficient documentation

## 2013-10-05 DIAGNOSIS — Z6841 Body Mass Index (BMI) 40.0 and over, adult: Secondary | ICD-10-CM | POA: Insufficient documentation

## 2013-10-05 LAB — BASIC METABOLIC PANEL
BUN: 19 mg/dL (ref 6–23)
CO2: 23 mEq/L (ref 19–32)
Calcium: 8.7 mg/dL (ref 8.4–10.5)
Chloride: 109 mEq/L (ref 96–112)
Creatinine, Ser: 0.81 mg/dL (ref 0.50–1.35)
GFR calc Af Amer: >90 mL/min (ref >90)
GFR calc non Af Amer: >90 mL/min (ref >90)
GLUCOSE: 170 mg/dL — AB (ref 70–99)
POTASSIUM: 4.6 meq/L (ref 3.7–5.3)
SODIUM: 141 meq/L (ref 137–147)

## 2013-10-05 LAB — CBC WITH DIFFERENTIAL/PLATELET
Basophils Absolute: 0 10*3/uL (ref 0.0–0.1)
Basophils Relative: 0 % (ref 0–1)
Eosinophils Absolute: 0.1 10*3/uL (ref 0.0–0.7)
Eosinophils Relative: 3 % (ref 0–5)
HCT: 29.3 % — ABNORMAL LOW (ref 39.0–52.0)
HEMOGLOBIN: 9.9 g/dL — AB (ref 13.0–17.0)
LYMPHS ABS: 0.6 10*3/uL — AB (ref 0.7–4.0)
Lymphocytes Relative: 19 % (ref 12–46)
MCH: 31.5 pg (ref 26.0–34.0)
MCHC: 33.8 g/dL (ref 30.0–36.0)
MCV: 93.3 fL (ref 78.0–100.0)
MONO ABS: 0.5 10*3/uL (ref 0.1–1.0)
Monocytes Relative: 15 % — ABNORMAL HIGH (ref 3–12)
NEUTROS ABS: 2.1 10*3/uL (ref 1.7–7.7)
Neutrophils Relative %: 63 % (ref 43–77)
Platelets: 46 10*3/uL — ABNORMAL LOW (ref 150–400)
RBC: 3.14 MIL/uL — ABNORMAL LOW (ref 4.22–5.81)
RDW: 14.3 % (ref 11.5–15.5)
WBC: 3.3 10*3/uL — ABNORMAL LOW (ref 4.0–10.5)

## 2013-10-05 LAB — HEPATIC FUNCTION PANEL
ALBUMIN: 2.7 g/dL — AB (ref 3.5–5.2)
ALK PHOS: 135 U/L — AB (ref 39–117)
ALT: 120 U/L — AB (ref 0–53)
AST: 241 U/L — ABNORMAL HIGH (ref 0–37)
Bilirubin, Direct: 0.6 mg/dL — ABNORMAL HIGH (ref 0.0–0.3)
Indirect Bilirubin: 0.7 mg/dL (ref 0.3–0.9)
TOTAL PROTEIN: 7.1 g/dL (ref 6.0–8.3)
Total Bilirubin: 1.3 mg/dL — ABNORMAL HIGH (ref 0.3–1.2)

## 2013-10-05 LAB — TROPONIN I
Troponin I: <0.3 ng/mL (ref <0.30)
Troponin I: <0.3 ng/mL (ref <0.30)

## 2013-10-05 LAB — AMMONIA: AMMONIA: 118 umol/L — AB (ref 11–60)

## 2013-10-05 MED ORDER — SODIUM CHLORIDE 0.9 % IV SOLN
1020.0000 mg | Freq: Once | INTRAVENOUS | Status: DC
  Filled 2013-10-05: qty 34

## 2013-10-05 MED ORDER — LACTULOSE 10 GM/15ML PO SOLN: 20.0000 g | Freq: Two times a day (BID) | ORAL | Status: DC

## 2013-10-05 MED ORDER — SODIUM CHLORIDE 0.9 % IV SOLN: Freq: Once | INTRAVENOUS | Status: DC

## 2013-10-05 MED ORDER — SODIUM CHLORIDE 0.9 % IJ SOLN: 10.0000 mL | INTRAMUSCULAR | Status: DC | PRN

## 2013-10-05 NOTE — ED Provider Notes (Signed)
CSN: [id number]     Arrival date & time 10/05/13  1220 History   First MD Initiated Contact with Patient 10/05/13 1308     Chief Complaint  Patient presents with  . Shortness of Breath  . Chest Pain     (Consider location/radiation/quality/duration/timing/severity/associated sxs/prior Treatment) Patient is a 53 y.o. male presenting with shortness of breath and chest pain. The history is provided by the patient.  Shortness of Breath Associated symptoms: chest pain   Chest Pain Associated symptoms: shortness of breath    He complains of exertional chest pain, that started, this morning. He was on his way to the hospital for an iron infusion. At the clinic, he was noted to have a 13 pound weight gain. Because of this, and the pain, he was sent here for evaluation. He has not had any history of cardiac problems. His weight gain is over one week. Has not had any problems eating. He feels like his legs and abdomen and swollen. He is scheduled to see his GI doctor today for evaluation, and has an appointment scheduled with his liver specialist, tomorrow. He denies fever, or chills, cough, shortness of breath, weakness, or dizziness. There been no change in his bowel and urinary habits. If the medications as prescribed. There are no other known modifying factors.    Past Medical History  Diagnosis Date  . Hypertension   . Diabetes mellitus   . GERD (gastroesophageal reflux disease)     DEC 2010 EGD/Bx REACTIVE GASTROPATHY, NO VARICES  . Hemorrhoids, internal   . BMI 40.0-44.9, adult OCT 2010 269 LBS    APR 2012 279 LBS AUG 2014 185 LBS  . 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 IV drug abuse REMOTE  . Hepatitis 2010 HEP C    AST 509 ALT 267 ALK PHOS 165 ALB 3.8 NEG IGM HAV/HBSAg  . Gallstone AUG 2012 1 CM  . GERD 10/25/2009  . COPD (chronic obstructive pulmonary disease)   . Hepatitis C   . Pancytopenia 2013  . Splenomegaly 2013  . Other pancytopenia 02/18/2013  . Iron  (Fe) deficiency anemia 02/18/2013  . Splenomegaly 02/18/2013   Past Surgical History  Procedure Laterality Date  . Sigmoidoscopy      2001 DR. FLEISCHMAN INTERNAL HERMORRHOIDS  . Upper gastrointestinal endoscopy  DEC 2010    BENIGN POLYPS, GASTRITIS, ?phg  . Knee surgery  RIGHT  . Hemorrhoid surgery    . Esophageal biopsy  09/08/2011    [id number] gastritis/Polyps, multiple in the body of the stomach   Family History  Problem Relation Age of Onset  . Colon cancer Neg Hx   . Anesthesia problems Neg Hx   . Hypotension Neg Hx   . Malignant hyperthermia Neg Hx   . Pseudochol deficiency Neg Hx   . Cancer Father    History  Substance Use Topics  . Smoking status: Never Smoker   . Smokeless tobacco: Never Used  . Alcohol Use: No     Comment: 30 years ago    Review of Systems  Respiratory: Positive for shortness of breath.   Cardiovascular: Positive for chest pain.  All other systems reviewed and are negative.      Allergies  Review of patient's allergies indicates no known allergies.  Home Medications   Current Outpatient Rx  Name  Route  Sig  Dispense  Refill  . acetaminophen (TYLENOL) 500 MG tablet   Oral   Take 1,000 mg by mouth  daily as needed for headache.         . albuterol (PROVENTIL HFA;VENTOLIN HFA) 108 (90 BASE) MCG/ACT inhaler   Inhalation   Inhale 2 puffs into the lungs every 6 (six) hours as needed. Shortness of breath         . esomeprazole (NEXIUM) 40 MG capsule   Oral   Take 40 mg by mouth 2 (two) times daily before a meal.         . furosemide (LASIX) 20 MG tablet   Oral   Take 20 mg by mouth daily.         Marland Kitchen glipiZIDE (GLUCOTROL) 10 MG tablet   Oral   Take 10 mg by mouth 2 (two) times daily before a meal.           . insulin glargine (LANTUS) 100 UNIT/ML injection   Subcutaneous   Inject 10 Units into the skin at bedtime.         Marland Kitchen oxyCODONE (OXY IR/ROXICODONE) 5 MG immediate release tablet      Up to 2 tablets every 4  hours as needed for pain..   100 tablet   0   . spironolactone (ALDACTONE) 50 MG tablet   Oral   Take 50 mg by mouth daily.         Marland Kitchen lactulose (CHRONULAC) 10 GM/15ML solution   Oral   Take 30 mLs (20 g total) by mouth 2 (two) times daily.   500 mL   0    BP 130/51  Pulse 73  Temp(Src) 98.1 F (36.7 C) (Oral)  Resp 16  Ht _0  (1.676 m)  Wt 296 lb (134.265 kg)  BMI 47.80 kg/m2  SpO2 98% Physical Exam  Nursing note and vitals reviewed. Constitutional: He is oriented to person, place, and time. He appears well-developed.  Overweight, he appears older than stated age.  HENT:  Head: Normocephalic and atraumatic.  Right Ear: External ear normal.  Left Ear: External ear normal.  Eyes: Conjunctivae and EOM are normal. Pupils are equal, round, and reactive to light.  Neck: Normal range of motion and phonation normal. Neck supple.  Cardiovascular: Normal rate, regular rhythm, normal heart sounds and intact distal pulses.   There is no JVD  Pulmonary/Chest: Effort normal and breath sounds normal. He exhibits no bony tenderness.  Abdominal: Soft. Normal appearance. There is no tenderness.  Musculoskeletal: Normal range of motion. He exhibits no tenderness.  2+ pitting edema of the legs, bilaterally. The legs are symmetric.  Neurological: He is alert and oriented to person, place, and time. No cranial nerve deficit or sensory deficit. He exhibits normal muscle tone. Coordination normal.  No dysarthria, aphasia, or lethargy  Skin: Skin is warm, dry and intact.  Psychiatric: He has a normal mood and affect. His behavior is normal. Judgment and thought content normal.    ED Course  Procedures (including critical care time)  Medications - No data to display  No data found.  Wt Readings from Last 3 Encounters:  10/05/13 296 lb (134.265 kg)  09/29/13 286 lb 4.8 oz (129.865 kg)  07/23/13 279 lb 11.2 oz (126.871 kg)    Consultation: I discussed the case with [name], from  gastroenterology. She will see the patient in follow up.       EKG Interpretation  Date/Time:  Wednesday October 05 2013 12:22:32 EST Ventricular Rate:  80 PR Interval:  140 QRS Duration: 90 QT Interval:  414 QTC Calculation: 477 R Axis:  50 Text Interpretation:  Normal sinus rhythm Normal ECG When compared with ECG of 22-Jul-2013 18:45, No significant change was found Confirmed by [facility]  MD, Ilanna Deihl ([id number]) on 10/05/2013 1:59:17 PM        Labs Review Labs Reviewed  CBC WITH DIFFERENTIAL - Abnormal; Notable for the following:    WBC 3.3 (*)    RBC 3.14 (*)    Hemoglobin 9.9 (*)    HCT 29.3 (*)    Platelets 46 (*)    Monocytes Relative 15 (*)    Lymphs Abs 0.6 (*)    All other components within normal limits  BASIC METABOLIC PANEL - Abnormal; Notable for the following:    Glucose, Bld 170 (*)    All other components within normal limits  HEPATIC FUNCTION PANEL - Abnormal; Notable for the following:    Albumin 2.7 (*)    AST 241 (*)    ALT 120 (*)    Alkaline Phosphatase 135 (*)    Total Bilirubin 1.3 (*)    Bilirubin, Direct 0.6 (*)    All other components within normal limits  AMMONIA - Abnormal; Notable for the following:    Ammonia 118 (*)    All other components within normal limits  TROPONIN I  TROPONIN I   Imaging Review No results found.   EKG Interpretation  Date/Time:  Wednesday October 05 2013 12:22:32 EST Ventricular Rate:  80 PR Interval:  140 QRS Duration: 90 QT Interval:  414 QTC Calculation: 477 R Axis:   50 Text Interpretation:  Normal sinus rhythm Normal ECG When compared with ECG of 22-Jul-2013 18:45, No significant change was found Confirmed by Annisa Mazzarella  MD, Robena E[address] ([id number]) on 10/05/2013 1:59:17 PM       MDM   Final diagnoses:  Chest pain  Dyspnea  Increased ammonia level  Hepatitis C infection    Nonspecific chest pain, with negative. Initial ED evaluation. His weight is up 4.3 kg from his recent baseline on 09/29/2013.  This is  associated with stable transaminitis and rising Ammonia level. Patient is mentating normally.  Nursing Notes Reviewed/ Care Coordinated Applicable Imaging Reviewed Interpretation of Laboratory Data incorporated into ED treatment  The patient appears reasonably screened and/or stabilized for discharge and I doubt any other medical condition or other [facility] requiring further screening, evaluation, or treatment in the ED at this time prior to discharge.  Plan: Home Medications- lactulose; Home Treatments- rest; return here if the recommended treatment, does not improve the symptoms; Recommended follow up- Hepatitis physician tomorrow, and regular GI f/u 3-5 days  Richarda Blade, MD 10/07/13 2117

## 2013-10-05 NOTE — Telephone Encounter (Signed)
[name] called and stated the patient is at [facility] ER department with chest pain. They may not make the 2:30 appt with [name], however he really needs to see her ASAP if they don't make it.

## 2013-10-05 NOTE — Progress Notes (Signed)
Patient arrived to cancer center for iron infusion, stated he has been having bil leg swelling and sob for the last week. Has a 10 pound weight gain since office visit 1 week ago. Stated that he began having left side cp since arriving at the center.  Patient's color was pale and patient was sweating.  Taken to ed per family/pt. Request.  Advised to call the clinic for follow up. Report given to charge nurse, leslie.

## 2013-10-05 NOTE — ED Notes (Signed)
Laverta Baltimore RN in specialty clinic reports pt has had 13lb weight gain in 1 week and swelling to lower legs.  Pt c/o increasing SOB and started having chest pain this morning.  Reports was in the Specialty clinic today for iron infusion because of iron deficiency anemia.  Pt started to feel worse before infusions started so pt was sent to ED for evaluation.

## 2013-10-05 NOTE — Telephone Encounter (Signed)
Pt was a no show

## 2013-10-05 NOTE — Discharge Instructions (Signed)
Follow up with your doctor's as scheduled. Return here if needed for problems.   Chest Pain (Nonspecific) It is often hard to give a specific diagnosis for the cause of chest pain. There is always a chance that your pain could be related to something serious, such as a heart attack or a blood clot in the lungs. You need to follow up with your caregiver for further evaluation. CAUSES   Heartburn.  Pneumonia or bronchitis.  Anxiety or stress.  Inflammation around your heart (pericarditis) or lung (pleuritis or pleurisy).  A blood clot in the lung.  A collapsed lung (pneumothorax). It can develop suddenly on its own (spontaneous pneumothorax) or from injury (trauma) to the chest.  Shingles infection (herpes zoster virus). The chest wall is composed of bones, muscles, and cartilage. Any of these can be the source of the pain.  The bones can be bruised by injury.  The muscles or cartilage can be strained by coughing or overwork.  The cartilage can be affected by inflammation and become sore (costochondritis). DIAGNOSIS  Lab tests or other studies, such as X-rays, electrocardiography, stress testing, or cardiac imaging, may be needed to find the cause of your pain.  TREATMENT   Treatment depends on what may be causing your chest pain. Treatment may include:  Acid blockers for heartburn.  Anti-inflammatory medicine.  Pain medicine for inflammatory conditions.  Antibiotics if an infection is present.  You may be advised to change lifestyle habits. This includes stopping smoking and avoiding alcohol, caffeine, and chocolate.  You may be advised to keep your head raised (elevated) when sleeping. This reduces the chance of acid going backward from your stomach into your esophagus.  Most of the time, nonspecific chest pain will improve within 2 to 3 days with rest and mild pain medicine. HOME CARE INSTRUCTIONS   If antibiotics were prescribed, take your antibiotics as directed.  Finish them even if you start to feel better.  For the next few days, avoid physical activities that bring on chest pain. Continue physical activities as directed.  Do not smoke.  Avoid drinking alcohol.  Only take over-the-counter or prescription medicine for pain, discomfort, or fever as directed by your caregiver.  Follow your caregiver's suggestions for further testing if your chest pain does not go away.  Keep any follow-up appointments you made. If you do not go to an appointment, you could develop lasting (chronic) problems with pain. If there is any problem keeping an appointment, you must call to reschedule. SEEK MEDICAL CARE IF:   You think you are having problems from the medicine you are taking. Read your medicine instructions carefully.  Your chest pain does not go away, even after treatment.  You develop a rash with blisters on your chest. SEEK IMMEDIATE MEDICAL CARE IF:   You have increased chest pain or pain that spreads to your arm, neck, jaw, back, or abdomen.  You develop shortness of breath, an increasing cough, or you are coughing up blood.  You have severe back or abdominal pain, feel nauseous, or vomit.  You develop severe weakness, fainting, or chills.  You have a fever. THIS IS AN EMERGENCY. Do not wait to see if the pain will go away. Get medical help at once. Call your local emergency services (911 in [place] Do not drive yourself to the hospital. MAKE SURE YOU:   Understand these instructions.  Will watch your condition.  Will get help right away if you are not doing well or get  worse. Document Released: 05/07/2005 Document Revised: 10/20/2011 Document Reviewed: 03/02/2008 [facility] Patient Information 2014 [place].  Hepatitis C Hepatitis C is a viral infection of the liver. Infection may go undetected for months or years because symptoms may be absent or very mild. Chronic liver disease is the main danger of hepatitis C. This may lead to  scarring of the liver (cirrhosis), liver failure, and liver cancer. CAUSES  Hepatitis C is caused by the hepatitis C virus (HCV). Formerly, hepatitis C infections were most commonly transmitted through blood transfusions. In the early 1990s, routine testing of donated blood for hepatitis C and exclusion of blood that tests positive for HCV began. Now, HCV is most commonly transmitted from person to person through injection drug use, sharing needles, or sex with an infected person. A caregiver may also get the infection from exposure to the blood of an infected patient by way of a cut or needle stick.  SYMPTOMS  Acute Phase Many cases of acute HCV infection are mild and cause few problems.Some people may not even realize they are sick.Symptoms in others may last a few weeks to several months and include:  Feeling very tired.  Loss of appetite.  Nausea.  Vomiting.  Abdominal pain.  Dark yellow urine.  Yellow skin and eyes (jaundice).  Itching of the skin. Chronic Phase  Between 50% to 85% of people who get HCV infection become "chronic carriers." They often have no symptoms, but the virus stays in their body.They may spread the virus to others and can get long-term liver disease.  Many people with chronic HCV infection remain healthy for many years. However, up to 1 in 5 chronically infected people may develop severe liver diseases including scarring of the liver (cirrhosis), liver failure, or liver cancer. DIAGNOSIS  Diagnosis of hepatitis C infection is made by testing blood for the presence of hepatitis C viral particles called RNA. Other tests may also be done to measure the status of current liver function, exclude other liver problems, or assess liver damage. TREATMENT  Treatment with many antiviral drugs is available and recommended for some patients with chronic HCV infection. Drug treatment is generally considered appropriate for patients who:  Are 38 years of age or  older.  Have a positive test for HCV particles in the blood.  Have a liver tissue sample (biopsy) that shows chronic hepatitis and significant scarring (fibrosis).  Do not have signs of liver failure.  Have acceptable blood test results that confirm the wellness of other body organs.  Are willing to be treated and conform to treatment requirements.  Have no other circumstances that would prevent treatment from being recommended (contraindications). All people who are offered and choose to receive drug treatment must understand that careful medical follow up for many months and even years is crucial in order to make successful care possible. The goal of drug treatment is to eliminate any evidence of HCV in the blood on a long-term basis. This is called a "sustained virologic response" or SVR. Achieving a SVR is associated with a decrease in the chance of life-threatening liver problems, need for a liver transplant, liver cancer rates, and liver-related complications. Successful treatment currently requires taking treatment drugs for at least 24 weeks and up to 72 weeks. An injected drug (interferon) given weekly and an oral antiviral medicine taken daily are usually prescribed. Side effects from these drugs are common and some may be very serious. Your response to treatment must be carefully monitored by both you  and your caregiver throughout the entire treatment period. PREVENTION There is no vaccine for hepatitis C. The only way to prevent the disease is to reduce the risk of exposure to the virus.   Avoid sharing drug needles or personal items like toothbrushes, razors, and nail clippers with an infected person.  Healthcare workers need to avoid injuries and wear appropriate protective equipment such as gloves, gowns, and face masks when performing invasive medical or nursing procedures. HOME CARE INSTRUCTIONS  To avoid making your liver disease worse:  Strictly avoid drinking  alcohol.  Carefully review all new prescriptions of medicines with your caregiver. Ask your caregiver which drugs you should avoid. The following drugs are toxic to the liver, and your caregiver may tell you to avoid them:  Isoniazid.  Methyldopa.  Acetaminophen.  Anabolic steroids (muscle-building drugs).  Erythromycin.  Oral contraceptives (birth control pills).  Check with your caregiver to make sure medicine you are currently taking will not be harmful.  Periodic blood tests may be required. Follow your caregiver's advice about when you should have blood tests.  Avoid a sexual relationship until advised otherwise by your caregiver.  Avoid activities that could expose other people to your blood. Examples include sharing a toothbrush, nail clippers, razors, and needles.  Bed rest is not necessary, but it may make you feel better. Recovery time is not related to the amount of rest you receive.  This infection is contagious. Follow your caregiver's instructions in order to avoid spread of the infection. SEEK IMMEDIATE MEDICAL CARE IF:  You have increasing fatigue or weakness.  You have an oral temperature above 102 F (38.9 C), not controlled by medicine.  You develop loss of appetite, nausea, or vomiting.  You develop jaundice.  You develop easy bruising or bleeding.  You develop any severe problems as a result of your treatment. MAKE SURE YOU:   Understand these instructions.  Will watch your condition.  Will get help right away if you are not doing well or get worse. Document Released: 07/25/2000 Document Revised: 10/20/2011 Document Reviewed: 11/27/2010 [facility] Patient Information 2014 [place], [place].

## 2013-10-05 NOTE — ED Notes (Signed)
Pt states the pain in the lft side of his chest has decreased 5/10. Continues to have a non productive cough

## 2013-10-05 NOTE — ED Notes (Signed)
Pt's iron infusion rescheduled for March 2nd at 1115. EDP back in to discuss discharge plan

## 2013-10-05 NOTE — Telephone Encounter (Signed)
CONTACT PT TO [facility] 

## 2013-10-05 NOTE — ED Notes (Signed)
Pt assisted to the bathroom. Complain of bad indigestion

## 2013-10-05 NOTE — ED Notes (Signed)
Pt states he has been sob for a while. States when he was walking into the building today he became more sob and the left side of his chest began to hurt. States his feet has been swelling and his weight has increaded

## 2013-10-06 ENCOUNTER — Encounter (HOSPITAL_COMMUNITY): Payer: Self-pay

## 2013-10-06 LAB — MISCELLANEOUS TEST

## 2013-10-06 LAB — TRANSFERRIN RECEPTOR, SOLUABLE: Transferrin Receptor, Soluble: 1.99

## 2013-10-10 ENCOUNTER — Encounter: Payer: Self-pay | Admitting: Gastroenterology

## 2013-10-10 ENCOUNTER — Encounter (HOSPITAL_COMMUNITY): Payer: Medicare Other | Attending: Hematology and Oncology

## 2013-10-10 DIAGNOSIS — K7460 Unspecified cirrhosis of liver: Secondary | ICD-10-CM | POA: Insufficient documentation

## 2013-10-10 DIAGNOSIS — Z6841 Body Mass Index (BMI) 40.0 and over, adult: Secondary | ICD-10-CM | POA: Insufficient documentation

## 2013-10-10 DIAGNOSIS — I10 Essential (primary) hypertension: Secondary | ICD-10-CM | POA: Insufficient documentation

## 2013-10-10 DIAGNOSIS — D509 Iron deficiency anemia, unspecified: Secondary | ICD-10-CM | POA: Insufficient documentation

## 2013-10-10 DIAGNOSIS — E669 Obesity, unspecified: Secondary | ICD-10-CM | POA: Insufficient documentation

## 2013-10-10 DIAGNOSIS — B1920 Unspecified viral hepatitis C without hepatic coma: Secondary | ICD-10-CM | POA: Insufficient documentation

## 2013-10-10 DIAGNOSIS — F1210 Cannabis abuse, uncomplicated: Secondary | ICD-10-CM | POA: Insufficient documentation

## 2013-10-10 DIAGNOSIS — R161 Splenomegaly, not elsewhere classified: Secondary | ICD-10-CM | POA: Insufficient documentation

## 2013-10-10 DIAGNOSIS — Z09 Encounter for follow-up examination after completed treatment for conditions other than malignant neoplasm: Secondary | ICD-10-CM | POA: Insufficient documentation

## 2013-10-10 DIAGNOSIS — J449 Chronic obstructive pulmonary disease, unspecified: Secondary | ICD-10-CM | POA: Insufficient documentation

## 2013-10-10 DIAGNOSIS — E119 Type 2 diabetes mellitus without complications: Secondary | ICD-10-CM | POA: Insufficient documentation

## 2013-10-10 DIAGNOSIS — F1410 Cocaine abuse, uncomplicated: Secondary | ICD-10-CM | POA: Insufficient documentation

## 2013-10-10 DIAGNOSIS — F1010 Alcohol abuse, uncomplicated: Secondary | ICD-10-CM | POA: Insufficient documentation

## 2013-10-10 DIAGNOSIS — K219 Gastro-esophageal reflux disease without esophagitis: Secondary | ICD-10-CM | POA: Insufficient documentation

## 2013-10-10 DIAGNOSIS — J4489 Other specified chronic obstructive pulmonary disease: Secondary | ICD-10-CM | POA: Insufficient documentation

## 2013-10-10 MED ORDER — SODIUM CHLORIDE 0.9 % IV SOLN
Freq: Once | INTRAVENOUS | Status: AC
  Administered 2013-10-10: 12:00:00 via INTRAVENOUS

## 2013-10-10 MED ORDER — SODIUM CHLORIDE 0.9 % IV SOLN
1020.0000 mg | Freq: Once | INTRAVENOUS | Status: AC
  Administered 2013-10-10: 1020 mg via INTRAVENOUS
  Filled 2013-10-10: qty 34

## 2013-10-10 NOTE — Telephone Encounter (Signed)
Mailed letter and OV made for 3/26 at 1030 with SF

## 2013-10-10 NOTE — Progress Notes (Signed)
Tolerated well

## 2013-10-11 ENCOUNTER — Telehealth (HOSPITAL_COMMUNITY): Payer: Self-pay | Admitting: Hematology and Oncology

## 2013-10-11 NOTE — Telephone Encounter (Signed)
RTN'D A CALL TO [facility] [phone] RE PTS APPLICATION FOR ASSIST FOR FERAHEME. PER MYA PT DOES NOT MEET  GUIDELINES BECAUSE HE DOES NOT HAVE CKD. APPLICATION WAS DENIED

## 2013-10-17 ENCOUNTER — Telehealth (HOSPITAL_COMMUNITY): Payer: Self-pay | Admitting: *Deleted

## 2013-10-17 ENCOUNTER — Telehealth (HOSPITAL_COMMUNITY): Payer: Self-pay | Admitting: Hematology and Oncology

## 2013-10-17 DIAGNOSIS — K7460 Unspecified cirrhosis of liver: Secondary | ICD-10-CM

## 2013-10-17 DIAGNOSIS — D509 Iron deficiency anemia, unspecified: Secondary | ICD-10-CM

## 2013-10-17 DIAGNOSIS — D731 Hypersplenism: Secondary | ICD-10-CM

## 2013-10-17 MED ORDER — OXYCODONE HCL 5 MG PO TABS: ORAL_TABLET | ORAL | Status: DC

## 2013-10-17 NOTE — Telephone Encounter (Signed)
See telephone note.

## 2013-10-17 NOTE — Telephone Encounter (Signed)
His wife called and said Roderic needed something for the pain he is having for cramping and his hands are drawing.

## 2013-10-24 ENCOUNTER — Emergency Department (HOSPITAL_COMMUNITY): Payer: Medicaid Other

## 2013-10-24 ENCOUNTER — Encounter (HOSPITAL_COMMUNITY): Payer: Self-pay | Admitting: Emergency Medicine

## 2013-10-24 ENCOUNTER — Inpatient Hospital Stay (HOSPITAL_COMMUNITY)
Admission: EM | Admit: 2013-10-24 | Discharge: 2013-10-27 | DRG: 433 | Disposition: A | Discharge status: Home / Self Care | Payer: Medicaid Other | Attending: Family Medicine | Admitting: Family Medicine

## 2013-10-24 DIAGNOSIS — Z6841 Body Mass Index (BMI) 40.0 and over, adult: Secondary | ICD-10-CM

## 2013-10-24 DIAGNOSIS — R188 Other ascites: Secondary | ICD-10-CM | POA: Diagnosis present

## 2013-10-24 DIAGNOSIS — I10 Essential (primary) hypertension: Secondary | ICD-10-CM | POA: Diagnosis present

## 2013-10-24 DIAGNOSIS — R0600 Dyspnea, unspecified: Secondary | ICD-10-CM

## 2013-10-24 DIAGNOSIS — K869 Disease of pancreas, unspecified: Secondary | ICD-10-CM

## 2013-10-24 DIAGNOSIS — K8020 Calculus of gallbladder without cholecystitis without obstruction: Secondary | ICD-10-CM

## 2013-10-24 DIAGNOSIS — E119 Type 2 diabetes mellitus without complications: Secondary | ICD-10-CM

## 2013-10-24 DIAGNOSIS — Z8639 Personal history of other endocrine, nutritional and metabolic disease: Secondary | ICD-10-CM

## 2013-10-24 DIAGNOSIS — Z1211 Encounter for screening for malignant neoplasm of colon: Secondary | ICD-10-CM

## 2013-10-24 DIAGNOSIS — B182 Chronic viral hepatitis C: Secondary | ICD-10-CM

## 2013-10-24 DIAGNOSIS — N390 Urinary tract infection, site not specified: Secondary | ICD-10-CM | POA: Diagnosis present

## 2013-10-24 DIAGNOSIS — K8000 Calculus of gallbladder with acute cholecystitis without obstruction: Secondary | ICD-10-CM

## 2013-10-24 DIAGNOSIS — R5381 Other malaise: Secondary | ICD-10-CM

## 2013-10-24 DIAGNOSIS — E669 Obesity, unspecified: Secondary | ICD-10-CM | POA: Diagnosis present

## 2013-10-24 DIAGNOSIS — R161 Splenomegaly, not elsewhere classified: Secondary | ICD-10-CM

## 2013-10-24 DIAGNOSIS — R1011 Right upper quadrant pain: Secondary | ICD-10-CM

## 2013-10-24 DIAGNOSIS — Z66 Do not resuscitate: Secondary | ICD-10-CM | POA: Diagnosis present

## 2013-10-24 DIAGNOSIS — R109 Unspecified abdominal pain: Secondary | ICD-10-CM

## 2013-10-24 DIAGNOSIS — Z862 Personal history of diseases of the blood and blood-forming organs and certain disorders involving the immune mechanism: Secondary | ICD-10-CM

## 2013-10-24 DIAGNOSIS — K8050 Calculus of bile duct without cholangitis or cholecystitis without obstruction: Secondary | ICD-10-CM

## 2013-10-24 DIAGNOSIS — R5383 Other fatigue: Secondary | ICD-10-CM

## 2013-10-24 DIAGNOSIS — K219 Gastro-esophageal reflux disease without esophagitis: Secondary | ICD-10-CM | POA: Diagnosis present

## 2013-10-24 DIAGNOSIS — R0602 Shortness of breath: Secondary | ICD-10-CM | POA: Diagnosis not present

## 2013-10-24 DIAGNOSIS — D61818 Other pancytopenia: Secondary | ICD-10-CM | POA: Diagnosis present

## 2013-10-24 DIAGNOSIS — D509 Iron deficiency anemia, unspecified: Secondary | ICD-10-CM

## 2013-10-24 DIAGNOSIS — K7460 Unspecified cirrhosis of liver: Principal | ICD-10-CM

## 2013-10-24 DIAGNOSIS — R0609 Other forms of dyspnea: Secondary | ICD-10-CM

## 2013-10-24 DIAGNOSIS — J449 Chronic obstructive pulmonary disease, unspecified: Secondary | ICD-10-CM | POA: Diagnosis present

## 2013-10-24 DIAGNOSIS — D731 Hypersplenism: Secondary | ICD-10-CM

## 2013-10-24 DIAGNOSIS — Z794 Long term (current) use of insulin: Secondary | ICD-10-CM | POA: Diagnosis not present

## 2013-10-24 DIAGNOSIS — R0989 Other specified symptoms and signs involving the circulatory and respiratory systems: Secondary | ICD-10-CM

## 2013-10-24 DIAGNOSIS — F1010 Alcohol abuse, uncomplicated: Secondary | ICD-10-CM

## 2013-10-24 DIAGNOSIS — J4489 Other specified chronic obstructive pulmonary disease: Secondary | ICD-10-CM | POA: Diagnosis present

## 2013-10-24 LAB — CBC WITH DIFFERENTIAL/PLATELET
BASOS ABS: 0 10*3/uL (ref 0.0–0.1)
BASOS PCT: 0 % (ref 0–1)
EOS ABS: 0.1 10*3/uL (ref 0.0–0.7)
Eosinophils Relative: 3 % (ref 0–5)
HEMATOCRIT: 28.9 % — AB (ref 39.0–52.0)
Hemoglobin: 9.8 g/dL — ABNORMAL LOW (ref 13.0–17.0)
Lymphocytes Relative: 26 % (ref 12–46)
Lymphs Abs: 0.9 10*3/uL (ref 0.7–4.0)
MCH: 32.1 pg (ref 26.0–34.0)
MCHC: 33.9 g/dL (ref 30.0–36.0)
MCV: 94.8 fL (ref 78.0–100.0)
MONO ABS: 0.5 10*3/uL (ref 0.1–1.0)
Monocytes Relative: 14 % — ABNORMAL HIGH (ref 3–12)
NEUTROS ABS: 1.8 10*3/uL (ref 1.7–7.7)
Neutrophils Relative %: 57 % (ref 43–77)
Platelets: 64 10*3/uL — ABNORMAL LOW (ref 150–400)
RBC: 3.05 MIL/uL — ABNORMAL LOW (ref 4.22–5.81)
RDW: 16.2 % — ABNORMAL HIGH (ref 11.5–15.5)
WBC: 3.3 10*3/uL — ABNORMAL LOW (ref 4.0–10.5)

## 2013-10-24 LAB — URINALYSIS, ROUTINE W REFLEX MICROSCOPIC
BILIRUBIN URINE: NEGATIVE
GLUCOSE, UA: 100 mg/dL — AB
KETONES UR: NEGATIVE mg/dL
Leukocytes, UA: NEGATIVE
Nitrite: NEGATIVE
PH: 6 (ref 5.0–8.0)
Protein, ur: NEGATIVE mg/dL
Urobilinogen, UA: 0.2 mg/dL (ref 0.0–1.0)

## 2013-10-24 LAB — TROPONIN I: Troponin I: <0.3 ng/mL (ref <0.30)

## 2013-10-24 LAB — LIPASE, BLOOD: Lipase: 86 U/L — ABNORMAL HIGH (ref 11–59)

## 2013-10-24 LAB — URINE MICROSCOPIC-ADD ON

## 2013-10-24 LAB — AMMONIA: Ammonia: 140 umol/L — ABNORMAL HIGH (ref 11–60)

## 2013-10-24 LAB — GLUCOSE, CAPILLARY: GLUCOSE-CAPILLARY: 91 mg/dL (ref 70–99)

## 2013-10-24 LAB — COMPREHENSIVE METABOLIC PANEL
ALT: 89 U/L — AB (ref 0–53)
AST: 181 U/L — AB (ref 0–37)
Albumin: 2.6 g/dL — ABNORMAL LOW (ref 3.5–5.2)
Alkaline Phosphatase: 123 U/L — ABNORMAL HIGH (ref 39–117)
BUN: 16 mg/dL (ref 6–23)
CO2: 22 meq/L (ref 19–32)
Calcium: 8.6 mg/dL (ref 8.4–10.5)
Chloride: 110 mEq/L (ref 96–112)
Creatinine, Ser: 0.75 mg/dL (ref 0.50–1.35)
GFR calc non Af Amer: >90 mL/min (ref >90)
Glucose, Bld: 124 mg/dL — ABNORMAL HIGH (ref 70–99)
POTASSIUM: 4.4 meq/L (ref 3.7–5.3)
SODIUM: 140 meq/L (ref 137–147)
TOTAL PROTEIN: 7 g/dL (ref 6.0–8.3)
Total Bilirubin: 1.4 mg/dL — ABNORMAL HIGH (ref 0.3–1.2)

## 2013-10-24 LAB — PRO B NATRIURETIC PEPTIDE: PRO B NATRI PEPTIDE: 374.8 pg/mL — AB (ref 0–125)

## 2013-10-24 MED ORDER — ONDANSETRON HCL 4 MG/2ML IJ SOLN
4.0000 mg | Freq: Once | INTRAMUSCULAR | Status: AC
  Administered 2013-10-24: 4 mg via INTRAVENOUS
  Filled 2013-10-24: qty 2

## 2013-10-24 MED ORDER — SODIUM CHLORIDE 0.9 % IJ SOLN
3.0000 mL | Freq: Two times a day (BID) | INTRAMUSCULAR | Status: DC
  Administered 2013-10-25 – 2013-10-27 (×6): 3 mL via INTRAVENOUS

## 2013-10-24 MED ORDER — DEXTROSE 5 % IV SOLN
1.0000 g | Freq: Once | INTRAVENOUS | Status: AC
  Administered 2013-10-24: 1 g via INTRAVENOUS
  Filled 2013-10-24: qty 10

## 2013-10-24 MED ORDER — ONDANSETRON HCL 4 MG/2ML IJ SOLN: 4.0000 mg | Freq: Four times a day (QID) | INTRAMUSCULAR | Status: DC | PRN

## 2013-10-24 MED ORDER — ONDANSETRON HCL 4 MG/2ML IJ SOLN: 4.0000 mg | Freq: Once | INTRAMUSCULAR | Status: DC

## 2013-10-24 MED ORDER — FUROSEMIDE 10 MG/ML IJ SOLN
40.0000 mg | Freq: Once | INTRAMUSCULAR | Status: AC
  Administered 2013-10-24: 40 mg via INTRAVENOUS
  Filled 2013-10-24: qty 4

## 2013-10-24 MED ORDER — MORPHINE SULFATE 4 MG/ML IJ SOLN
4.0000 mg | Freq: Once | INTRAMUSCULAR | Status: AC
  Administered 2013-10-24: 4 mg via INTRAVENOUS
  Filled 2013-10-24: qty 1

## 2013-10-24 MED ORDER — SODIUM CHLORIDE 0.9 % IJ SOLN
3.0000 mL | INTRAMUSCULAR | Status: DC | PRN
  Administered 2013-10-27: 3 mL via INTRAVENOUS

## 2013-10-24 MED ORDER — DEXTROSE 5 % IV SOLN
1.0000 g | INTRAVENOUS | Status: DC
  Administered 2013-10-25: 1 g via INTRAVENOUS
  Filled 2013-10-24 (×2): qty 10

## 2013-10-24 MED ORDER — MORPHINE SULFATE 2 MG/ML IJ SOLN
2.0000 mg | INTRAMUSCULAR | Status: DC | PRN
  Administered 2013-10-24 – 2013-10-27 (×14): 2 mg via INTRAVENOUS
  Filled 2013-10-24 (×14): qty 1

## 2013-10-24 MED ORDER — DEXTROSE 5 % IV SOLN
1.0000 g | INTRAVENOUS | Status: DC
  Filled 2013-10-24: qty 10

## 2013-10-24 MED ORDER — SPIRONOLACTONE 25 MG PO TABS
50.0000 mg | ORAL_TABLET | Freq: Every day | ORAL | Status: DC
  Administered 2013-10-25 – 2013-10-26 (×2): 50 mg via ORAL
  Filled 2013-10-24 (×2): qty 2
  Filled 2013-10-24 (×2): qty 1

## 2013-10-24 MED ORDER — FUROSEMIDE 10 MG/ML IJ SOLN
20.0000 mg | Freq: Two times a day (BID) | INTRAMUSCULAR | Status: DC
  Administered 2013-10-25 – 2013-10-26 (×4): 20 mg via INTRAVENOUS
  Filled 2013-10-24 (×4): qty 2

## 2013-10-24 MED ORDER — SODIUM CHLORIDE 0.9 % IV SOLN: 250.0000 mL | INTRAVENOUS | Status: DC | PRN

## 2013-10-24 MED ORDER — INSULIN GLARGINE 100 UNIT/ML ~~LOC~~ SOLN
10.0000 [IU] | Freq: Every day | SUBCUTANEOUS | Status: DC
  Administered 2013-10-25 – 2013-10-26 (×3): 10 [IU] via SUBCUTANEOUS
  Filled 2013-10-24 (×4): qty 0.1

## 2013-10-24 MED ORDER — INSULIN ASPART 100 UNIT/ML ~~LOC~~ SOLN
0.0000 [IU] | Freq: Three times a day (TID) | SUBCUTANEOUS | Status: DC
  Administered 2013-10-26 (×2): 2 [IU] via SUBCUTANEOUS
  Administered 2013-10-27: 1 [IU] via SUBCUTANEOUS
  Administered 2013-10-27: 2 [IU] via SUBCUTANEOUS

## 2013-10-24 MED ORDER — ONDANSETRON HCL 4 MG PO TABS: 4.0000 mg | ORAL_TABLET | Freq: Four times a day (QID) | ORAL | Status: DC | PRN

## 2013-10-24 NOTE — ED Provider Notes (Signed)
CSN: [id number]     Arrival date & time 10/24/13  1732 History  This chart was scribed for Nat Christen, MD by Zettie Pho, ED Scribe. This patient was seen in room APA02/APA02 and the patient's care was started at 6:06 PM.    Chief Complaint  Patient presents with  . Shortness of Breath   The history is provided by the patient. No language interpreter was used.   HPI Comments: [name] is a 53 y.o. male with a history of COPD, cirrhosis, Hepatitis C, and splenomegaly who presents to the Emergency Department complaining of shortness of breath with an associated dry cough onset about a month ago. Patient reports associated fluid retention, including abdominal distention and edema to the bilateral lower extremities, with associated pain to the RLQ of the abdomen. He denies known history of ascites. He has a surgical history of sigmoidoscopy, upper GI endoscopy, and esophageal biopsy. Patient also has a history of HTN, DM, pancytopenia.  Liver specialist- [name]   Past Medical History  Diagnosis Date  . Hypertension   . Diabetes mellitus   . GERD (gastroesophageal reflux disease)     DEC 2010 EGD/Bx REACTIVE GASTROPATHY, NO VARICES  . Hemorrhoids, internal   . BMI 40.0-44.9, adult OCT 2010 269 LBS    APR 2012 279 LBS AUG 2014 185 LBS  . 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 IV drug abuse REMOTE  . Hepatitis 2010 HEP C    AST 509 ALT 267 ALK PHOS 165 ALB 3.8 NEG IGM HAV/HBSAg  . Gallstone AUG 2012 1 CM  . GERD 10/25/2009  . COPD (chronic obstructive pulmonary disease)   . Hepatitis C   . Pancytopenia 2013  . Splenomegaly 2013  . Other pancytopenia 02/18/2013  . Iron (Fe) deficiency anemia 02/18/2013  . Splenomegaly 02/18/2013   Past Surgical History  Procedure Laterality Date  . Sigmoidoscopy      2001 DR. FLEISCHMAN INTERNAL HERMORRHOIDS  . Upper gastrointestinal endoscopy  DEC 2010    BENIGN POLYPS, GASTRITIS, ?phg  . Knee surgery  RIGHT  . Hemorrhoid  surgery    . Esophageal biopsy  09/08/2011    BSW:HQPRFFMB gastritis/Polyps, multiple in the body of the stomach   Family History  Problem Relation Age of Onset  . Colon cancer Neg Hx   . Anesthesia problems Neg Hx   . Hypotension Neg Hx   . Malignant hyperthermia Neg Hx   . Pseudochol deficiency Neg Hx   . Cancer Father    History  Substance Use Topics  . Smoking status: Never Smoker   . Smokeless tobacco: Never Used  . Alcohol Use: No     Comment: 30 years ago    Review of Systems A complete 10 system review of systems was obtained and all systems are negative except as noted in the HPI and PMH.     Allergies  Review of patient's allergies indicates no known allergies.  Home Medications   Current Outpatient Rx  Name  Route  Sig  Dispense  Refill  . acetaminophen (TYLENOL) 500 MG tablet   Oral   Take 1,000 mg by mouth daily as needed for headache.         . albuterol (PROVENTIL HFA;VENTOLIN HFA) 108 (90 BASE) MCG/ACT inhaler   Inhalation   Inhale 2 puffs into the lungs every 6 (six) hours as needed. Shortness of breath         . esomeprazole (NEXIUM) 40  MG capsule   Oral   Take 40 mg by mouth 2 (two) times daily before a meal.         . furosemide (LASIX) 20 MG tablet   Oral   Take 20 mg by mouth daily.         Marland Kitchen glipiZIDE (GLUCOTROL) 10 MG tablet   Oral   Take 10 mg by mouth 2 (two) times daily before a meal.           . insulin glargine (LANTUS) 100 UNIT/ML injection   Subcutaneous   Inject 10 Units into the skin at bedtime.         Marland Kitchen oxyCODONE (OXY IR/ROXICODONE) 5 MG immediate release tablet   Oral   Take 5-10 mg by mouth every 4 (four) hours as needed for moderate pain or severe pain. Up to 2 tablets every 4 hours as needed for pain..         . spironolactone (ALDACTONE) 50 MG tablet   Oral   Take 50 mg by mouth daily.          Triage Vitals: BP 152/50  Pulse 73  Temp(Src) 98.2 F (36.8 C) (Oral)  Resp 24  Ht _0  (1.753 m)   Wt 300 lb (136.079 kg)  BMI 44.28 kg/m2  SpO2 100%  Physical Exam  Nursing note and vitals reviewed. Constitutional: He is oriented to person, place, and time. He appears well-developed and well-nourished.  Overweight  HENT:  Head: Normocephalic and atraumatic.  Eyes: Conjunctivae and EOM are normal. Pupils are equal, round, and reactive to light.  Neck: Normal range of motion. Neck supple.  Cardiovascular: Normal rate, regular rhythm and normal heart sounds.   Pulmonary/Chest: Effort normal and breath sounds normal.  Abdominal: Soft. Bowel sounds are normal. He exhibits distension.  Musculoskeletal: Normal range of motion. He exhibits edema.  2+ peripheral edema  Neurological: He is alert and oriented to person, place, and time.  Skin: Skin is warm and dry.  Psychiatric: He has a normal mood and affect. His behavior is normal.    ED Course  Procedures (including critical care time)  DIAGNOSTIC STUDIES: Oxygen Saturation is 100% on room air, normal by my interpretation.    COORDINATION OF CARE: 6:10 PM- Will order blood labs (CMP, CBC, lipase), UA, an abdominal CT, and an EKG. Will order morphine and Zofran to manage symptoms. Discussed treatment plan with patient at bedside and patient verbalized agreement.   Results for orders placed during the hospital encounter of 10/24/13  COMPREHENSIVE METABOLIC PANEL      Result Value Ref Range   Sodium 140  137 - 147 mEq/L   Potassium 4.4  3.7 - 5.3 mEq/L   Chloride 110  96 - 112 mEq/L   CO2 22  19 - 32 mEq/L   Glucose, Bld 124 (*) 70 - 99 mg/dL   BUN 16  6 - 23 mg/dL   Creatinine, Ser 0.75  0.50 - 1.35 mg/dL   Calcium 8.6  8.4 - 10.5 mg/dL   Total Protein 7.0  6.0 - 8.3 g/dL   Albumin 2.6 (*) 3.5 - 5.2 g/dL   AST 181 (*) 0 - 37 U/L   ALT 89 (*) 0 - 53 U/L   Alkaline Phosphatase 123 (*) 39 - 117 U/L   Total Bilirubin 1.4 (*) 0.3 - 1.2 mg/dL   GFR calc non Af Amer >90  >90 mL/min   GFR calc Af Amer >90  >90 mL/min  CBC  WITH  DIFFERENTIAL      Result Value Ref Range   WBC 3.3 (*) 4.0 - 10.5 K/uL   RBC 3.05 (*) 4.22 - 5.81 MIL/uL   Hemoglobin 9.8 (*) 13.0 - 17.0 g/dL   HCT 28.9 (*) 39.0 - 52.0 %   MCV 94.8  78.0 - 100.0 fL   MCH 32.1  26.0 - 34.0 pg   MCHC 33.9  30.0 - 36.0 g/dL   RDW 16.2 (*) 11.5 - 15.5 %   Platelets 64 (*) 150 - 400 K/uL   Neutrophils Relative % 57  43 - 77 %   Lymphocytes Relative 26  12 - 46 %   Monocytes Relative 14 (*) 3 - 12 %   Eosinophils Relative 3  0 - 5 %   Basophils Relative 0  0 - 1 %   Neutro Abs 1.8  1.7 - 7.7 K/uL   Lymphs Abs 0.9  0.7 - 4.0 K/uL   Monocytes Absolute 0.5  0.1 - 1.0 K/uL   Eosinophils Absolute 0.1  0.0 - 0.7 K/uL   Basophils Absolute 0.0  0.0 - 0.1 K/uL   WBC Morphology ATYPICAL LYMPHOCYTES     Smear Review PLATELET COUNT CONFIRMED BY SMEAR    LIPASE, BLOOD      Result Value Ref Range   Lipase 86 (*) 11 - 59 U/L  URINALYSIS, ROUTINE W REFLEX MICROSCOPIC      Result Value Ref Range   Color, Urine YELLOW  YELLOW   APPearance CLEAR  CLEAR   Specific Gravity, Urine >1.030 (*) 1.005 - 1.030   pH 6.0  5.0 - 8.0   Glucose, UA 100 (*) NEGATIVE mg/dL   Hgb urine dipstick TRACE (*) NEGATIVE   Bilirubin Urine NEGATIVE  NEGATIVE   Ketones, ur NEGATIVE  NEGATIVE mg/dL   Protein, ur NEGATIVE  NEGATIVE mg/dL   Urobilinogen, UA 0.2  0.0 - 1.0 mg/dL   Nitrite NEGATIVE  NEGATIVE   Leukocytes, UA NEGATIVE  NEGATIVE  AMMONIA      Result Value Ref Range   Ammonia 140 (*) 11 - 60 umol/L  URINE MICROSCOPIC-ADD ON      Result Value Ref Range   Squamous Epithelial / LPF RARE  RARE   WBC, UA 11-20  <3 WBC/hpf   RBC / HPF 7-10  <3 RBC/hpf   Bacteria, UA FEW (*) RARE   Dg Abd Acute W/chest  10/24/2013   CLINICAL DATA:  Shortness of breath.  Fluid retention.  Hepatitis-C.  EXAM: ACUTE ABDOMEN SERIES (ABDOMEN 2 VIEW & CHEST 1 VIEW)  COMPARISON:  DG CHEST 1V PORT dated 10/05/2013; CT ABD - PELV W/ CM dated 07/22/2013  FINDINGS: The lungs appear clear.  Cardiac and  mediastinal contours normal.  No pleural effusion identified.  No free intraperitoneal gas beneath the hemidiaphragms. The upright view includes from the iliac crests in above, but not the anatomic pelvis. No significant abnormal upper abdominal air-fluid levels are noted.  No dilated bowel.  Gas and stool noted in the colon.  IMPRESSION: 1. Unremarkable bowel gas pattern   Electronically Signed   By: Sherryl Barters M.D.   On: 10/24/2013 19:08    EKG Interpretation   Date/Time:  Monday October 24 2013 18:03:11 EDT Ventricular Rate:  71 PR Interval:  140 QRS Duration: 90 QT Interval:  446 QTC Calculation: 484 R Axis:   30 Text Interpretation:  Normal sinus rhythm Prolonged QT Abnormal ECG When  compared with ECG of 05-Oct-2013 12:22, No  significant change was found  Confirmed by Lacinda Axon  MD, Caylen Yardley ([id number]) on 10/24/2013 6:30:46 PM      MDM   Final diagnoses:  Dyspnea   Patient has multiple health problems.  I suspect his dyspnea is related to worsening ascites secondary to his cirrhosis.  Ammonia level elevated. Also, urinalysis show evidence of a minor urinary tract infection. Admit to general medicine.  I personally performed the services described in this documentation, which was scribed in my presence. The recorded information has been reviewed and is accurate.     Nat Christen, MD 10/24/13 2109

## 2013-10-24 NOTE — ED Notes (Signed)
Pt co SOB and fluid retention, abdominal distention, periorbital edema, lower extremity edema x 1 day.

## 2013-10-24 NOTE — H&P (Addendum)
PCP:   Jacqualine Mau, PA-C   Chief Complaint:  Shortness of breath  HPI: 53 year old male who   has a past medical history of Hypertension; Diabetes mellitus; GERD (gastroesophageal reflux disease); Hemorrhoids, internal; BMI 40.0-44.9, adult (OCT 2010 269 LBS); Cirrhosis (NOV 2010 CHILD PUGH A); IV drug abuse (REMOTE); Hepatitis (2010 HEP C); Gallstone (AUG 2012 1 CM); GERD (10/25/2009); COPD (chronic obstructive pulmonary disease); Hepatitis C; Pancytopenia (2013); Splenomegaly (2013); Other pancytopenia (02/18/2013); Iron (Fe) deficiency anemia (02/18/2013); and Splenomegaly (02/18/2013). Presents to the ED with chief complaint of abdominal pain, shortness of breath going on for past 3 days. Patient has a history of liver cirrhosis, chronic hepatitis C and takes furosemide at home along with Aldactone, as per patient he ran out of Lasix and has not taken this medication for past 2 days. He is noticed increasing swelling in the legs, shortness of breath and abdominal distention. Patient has a history of gallstones, but was not operated as he did not have cholecystitis. He denies nausea vomiting had 2 episodes of loose bowel movements. He denies fever or dysuria but admits to having cough for past 2 days. He also complains of intermittent chest pain associated with radiation to the left arm. He does not have history of CAD. He In the ED patient found to have mild elevation of ammonia, abnormal UA.  Allergies:  No Known Allergies    Past Medical History  Diagnosis Date  . Hypertension   . Diabetes mellitus   . GERD (gastroesophageal reflux disease)     DEC 2010 EGD/Bx REACTIVE GASTROPATHY, NO VARICES  . Hemorrhoids, internal   . BMI 40.0-44.9, adult OCT 2010 269 LBS    APR 2012 279 LBS AUG 2014 185 LBS  . 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 IV drug abuse REMOTE  . Hepatitis 2010 HEP C    AST 509 ALT 267 ALK PHOS 165 ALB 3.8 NEG IGM HAV/HBSAg  . Gallstone AUG 2012 1 CM   . GERD 10/25/2009  . COPD (chronic obstructive pulmonary disease)   . Hepatitis C   . Pancytopenia 2013  . Splenomegaly 2013  . Other pancytopenia 02/18/2013  . Iron (Fe) deficiency anemia 02/18/2013  . Splenomegaly 02/18/2013    Past Surgical History  Procedure Laterality Date  . Sigmoidoscopy      2001 DR. FLEISCHMAN INTERNAL HERMORRHOIDS  . Upper gastrointestinal endoscopy  DEC 2010    BENIGN POLYPS, GASTRITIS, ?phg  . Knee surgery  RIGHT  . Hemorrhoid surgery    . Esophageal biopsy  09/08/2011    RWE:[id number] gastritis/Polyps, multiple in the body of the stomach    Prior to Admission medications   Medication Sig Start Date End Date Taking? Authorizing Provider  acetaminophen (TYLENOL) 500 MG tablet Take 1,000 mg by mouth daily as needed for headache.   Yes Historical Provider, MD  albuterol (PROVENTIL HFA;VENTOLIN HFA) 108 (90 BASE) MCG/ACT inhaler Inhale 2 puffs into the lungs every 6 (six) hours as needed. Shortness of breath   Yes Historical Provider, MD  esomeprazole (NEXIUM) 40 MG capsule Take 40 mg by mouth 2 (two) times daily before a meal.   Yes Historical Provider, MD  furosemide (LASIX) 20 MG tablet Take 20 mg by mouth daily.   Yes Historical Provider, MD  glipiZIDE (GLUCOTROL) 10 MG tablet Take 10 mg by mouth 2 (two) times daily before a meal.     Yes Historical Provider, MD  insulin glargine (LANTUS) 100 UNIT/ML injection  Inject 10 Units into the skin at bedtime.   Yes Historical Provider, MD  oxyCODONE (OXY IR/ROXICODONE) 5 MG immediate release tablet Take 5-10 mg by mouth every 4 (four) hours as needed for moderate pain or severe pain. Up to 2 tablets every 4 hours as needed for pain.. 10/17/13  Yes Farrel Gobble, MD  spironolactone (ALDACTONE) 50 MG tablet Take 50 mg by mouth daily.   Yes Historical Provider, MD    Social History:  reports that he has never smoked. He has never used smokeless tobacco. He reports that he uses illicit drugs (Marijuana and Cocaine).  He reports that he does not drink alcohol.  Family History  Problem Relation Age of Onset  . Colon cancer Neg Hx   . Anesthesia problems Neg Hx   . Hypotension Neg Hx   . Malignant hyperthermia Neg Hx   . Pseudochol deficiency Neg Hx   . Cancer Father      All the positives are listed in BOLD  Review of Systems:  HEENT: Headache, blurred vision, runny nose, sore throat Neck: Hypothyroidism, hyperthyroidism,,lymphadenopathy Chest : Shortness of breath, history of COPD, Asthma Heart : Chest pain, history of coronary arterey disease GI:  Nausea, vomiting, diarrhea, constipation, GERD GU: Dysuria, urgency, frequency of urination, hematuria Neuro: Stroke, seizures, syncope Psych: Depression, anxiety, hallucinations   Physical Exam: Blood pressure 140/51, pulse 72, temperature 98 F (36.7 C), temperature source Oral, resp. rate 15, height $RemoveBe'5\' 9"'[id number]$  (1.753 m), weight 136.079 kg (300 lb), SpO2 100.00%. Constitutional:   Patient is a well-developed and well-nourished male* in no acute distress and cooperative with exam. Head: Normocephalic and atraumatic Mouth: Mucus membranes moist Eyes: PERRL, EOMI, conjunctivae normal Neck: Supple, No Thyromegaly Cardiovascular: RRR, S1 normal, S2 normal Pulmonary/Chest: CTAB, no wheezes, rales, or rhonchi Abdominal: Soft. Positive tenderness in the right upper quadrant, distended abdomen, bowel sounds are normal, no masses, organomegaly, or guarding present.  Neurological: A&O x3, Strenght is normal and symmetric bilaterally, cranial nerve II-XII are grossly intact, no focal motor deficit, sensory intact to light touch bilaterally.  Extremities : Bilateral 2+ edema of the lower extremities   Labs on Admission:  Results for orders placed during the hospital encounter of 10/24/13 (from the past 48 hour(s))  COMPREHENSIVE METABOLIC PANEL     Status: Abnormal   Collection Time    10/24/13  6:10 PM      Result Value Ref Range   Sodium 140  137 - 147  mEq/L   Potassium 4.4  3.7 - 5.3 mEq/L   Chloride 110  96 - 112 mEq/L   CO2 22  19 - 32 mEq/L   Glucose, Bld 124 (*) 70 - 99 mg/dL   BUN 16  6 - 23 mg/dL   Creatinine, Ser 0.75  0.50 - 1.35 mg/dL   Calcium 8.6  8.4 - 10.5 mg/dL   Total Protein 7.0  6.0 - 8.3 g/dL   Albumin 2.6 (*) 3.5 - 5.2 g/dL   AST 181 (*) 0 - 37 U/L   ALT 89 (*) 0 - 53 U/L   Alkaline Phosphatase 123 (*) 39 - 117 U/L   Total Bilirubin 1.4 (*) 0.3 - 1.2 mg/dL   GFR calc non Af Amer >90  >90 mL/min   GFR calc Af Amer >90  >90 mL/min   Comment: (NOTE)     The eGFR has been calculated using the CKD EPI equation.     This calculation has not been validated in all clinical situations.  eGFR's persistently <90 mL/min signify possible Chronic Kidney     Disease.  CBC WITH DIFFERENTIAL     Status: Abnormal   Collection Time    10/24/13  6:10 PM      Result Value Ref Range   WBC 3.3 (*) 4.0 - 10.5 K/uL   RBC 3.05 (*) 4.22 - 5.81 MIL/uL   Hemoglobin 9.8 (*) 13.0 - 17.0 g/dL   HCT 28.9 (*) 39.0 - 52.0 %   MCV 94.8  78.0 - 100.0 fL   MCH 32.1  26.0 - 34.0 pg   MCHC 33.9  30.0 - 36.0 g/dL   RDW 16.2 (*) 11.5 - 15.5 %   Platelets 64 (*) 150 - 400 K/uL   Neutrophils Relative % 57  43 - 77 %   Lymphocytes Relative 26  12 - 46 %   Monocytes Relative 14 (*) 3 - 12 %   Eosinophils Relative 3  0 - 5 %   Basophils Relative 0  0 - 1 %   Neutro Abs 1.8  1.7 - 7.7 K/uL   Lymphs Abs 0.9  0.7 - 4.0 K/uL   Monocytes Absolute 0.5  0.1 - 1.0 K/uL   Eosinophils Absolute 0.1  0.0 - 0.7 K/uL   Basophils Absolute 0.0  0.0 - 0.1 K/uL   WBC Morphology ATYPICAL LYMPHOCYTES     Smear Review PLATELET COUNT CONFIRMED BY SMEAR     Comment: PLATELETS APPEAR DECREASED     LARGE PLATELETS PRESENT  LIPASE, BLOOD     Status: Abnormal   Collection Time    10/24/13  6:10 PM      Result Value Ref Range   Lipase 86 (*) 11 - 59 U/L  URINALYSIS, ROUTINE W REFLEX MICROSCOPIC     Status: Abnormal   Collection Time    10/24/13  6:50 PM       Result Value Ref Range   Color, Urine YELLOW  YELLOW   APPearance CLEAR  CLEAR   Specific Gravity, Urine >1.030 (*) 1.005 - 1.030   pH 6.0  5.0 - 8.0   Glucose, UA 100 (*) NEGATIVE mg/dL   Hgb urine dipstick TRACE (*) NEGATIVE   Bilirubin Urine NEGATIVE  NEGATIVE   Ketones, ur NEGATIVE  NEGATIVE mg/dL   Protein, ur NEGATIVE  NEGATIVE mg/dL   Urobilinogen, UA 0.2  0.0 - 1.0 mg/dL   Nitrite NEGATIVE  NEGATIVE   Leukocytes, UA NEGATIVE  NEGATIVE  URINE MICROSCOPIC-ADD ON     Status: Abnormal   Collection Time    10/24/13  6:50 PM      Result Value Ref Range   Squamous Epithelial / LPF RARE  RARE   WBC, UA 11-20  <3 WBC/hpf   RBC / HPF 7-10  <3 RBC/hpf   Bacteria, UA FEW (*) RARE  AMMONIA     Status: Abnormal   Collection Time    10/24/13  7:14 PM      Result Value Ref Range   Ammonia 140 (*) 11 - 60 umol/L    Radiological Exams on Admission: Dg Abd Acute W/chest  10/24/2013   CLINICAL DATA:  Shortness of breath.  Fluid retention.  Hepatitis-C.  EXAM: ACUTE ABDOMEN SERIES (ABDOMEN 2 VIEW & CHEST 1 VIEW)  COMPARISON:  DG CHEST 1V PORT dated 10/05/2013; CT ABD - PELV W/ CM dated 07/22/2013  FINDINGS: The lungs appear clear.  Cardiac and mediastinal contours normal.  No pleural effusion identified.  No free intraperitoneal gas beneath the  hemidiaphragms. The upright view includes from the iliac crests in above, but not the anatomic pelvis. No significant abnormal upper abdominal air-fluid levels are noted.  No dilated bowel.  Gas and stool noted in the colon.  IMPRESSION: 1. Unremarkable bowel gas pattern   Electronically Signed   By: Sherryl Barters M.D.   On: 10/24/2013 19:08    Assessment/Plan Active Problems:   HEPATITIS C, CHRONIC   DM   CIRRHOSIS   Other pancytopenia   Dyspnea  Dyspnea Likely due to abdominal distention, patient has a history of liver cirrhosis due to hepatitis C. Never had paracentesis done in the past he and will obtain abdominal ultrasound in a.m., he  might need paracentesis in a.m. Chest x-ray is clear. We'll also obtain BNP. As patient did not take Lasix for 2 days, which has exacerbated dyspnea. We'll start him on Lasix 20 mg IV every 12 hours.  Abdominal pain Likely due to ascites versus biliary colic, will start him on Lasix and obtain abdominal ultrasound in a.m. to rule out underlying cholecystitis, and massive ascites which could be contributing to the abdominal pain. Liver enzymes are mildly elevated to 181/89 lipase also mildly elevated to 86. Alkaline phosphatase 123 Will continue empiric antibiotics.  Liver cirrhosis Patient has cirrhosis due to chronic hepatitis C, liver enzymes or normal at this time. Patient will follow up with Dr. Bertell Maria at [facility] on Thursday this week.  Diabetes mellitus Will continue Lantus and start sliding scale insulin.  ? UTI Patient has abnormal UA, has been given one dose of Rocephin. We'll follow the urine cultures and continue empiric Rocephin.  Chest pain EKG shows prolonged QT interval. Will follow 3 sets of cardiac enzymes.  Pancytopenia Patient has pancytopenia secondary to liver cirrhosis and splenomegaly. We'll continue to monitor the CBC in the hospital.  Code status: Patient is DO NOT RESUSCITATE  Family discussion: Discussed with patient's wife at bedside   Time Spent on Admission: 78 minutes  Tyrez Berrios S [facility] Pager: [phone] 10/24/2013, 9:17 PM  If 7PM-7AM, please contact night-coverage  [email or web address]  Password [email or web address]

## 2013-10-25 ENCOUNTER — Inpatient Hospital Stay (HOSPITAL_COMMUNITY): Payer: Medicaid Other

## 2013-10-25 ENCOUNTER — Encounter (HOSPITAL_COMMUNITY): Payer: Self-pay | Admitting: General Practice

## 2013-10-25 DIAGNOSIS — R1011 Right upper quadrant pain: Secondary | ICD-10-CM

## 2013-10-25 DIAGNOSIS — R188 Other ascites: Secondary | ICD-10-CM

## 2013-10-25 LAB — GLUCOSE, CAPILLARY
GLUCOSE-CAPILLARY: 103 mg/dL — AB (ref 70–99)
Glucose-Capillary: 142 mg/dL — ABNORMAL HIGH (ref 70–99)
Glucose-Capillary: 104 mg/dL — ABNORMAL HIGH (ref 70–99)
Glucose-Capillary: 142 mg/dL — ABNORMAL HIGH (ref 70–99)
Glucose-Capillary: 201 mg/dL — ABNORMAL HIGH (ref 70–99)

## 2013-10-25 LAB — CBC
HCT: 29.7 % — ABNORMAL LOW (ref 39.0–52.0)
HEMOGLOBIN: 10.2 g/dL — AB (ref 13.0–17.0)
MCH: 32.6 pg (ref 26.0–34.0)
MCHC: 34.3 g/dL (ref 30.0–36.0)
MCV: 94.9 fL (ref 78.0–100.0)
Platelets: 57 10*3/uL — ABNORMAL LOW (ref 150–400)
RBC: 3.13 MIL/uL — ABNORMAL LOW (ref 4.22–5.81)
RDW: 16.3 % — AB (ref 11.5–15.5)
WBC: 3.1 10*3/uL — ABNORMAL LOW (ref 4.0–10.5)

## 2013-10-25 LAB — COMPREHENSIVE METABOLIC PANEL
ALBUMIN: 2.7 g/dL — AB (ref 3.5–5.2)
ALT: 97 U/L — ABNORMAL HIGH (ref 0–53)
AST: 207 U/L — ABNORMAL HIGH (ref 0–37)
Alkaline Phosphatase: 115 U/L (ref 39–117)
BUN: 16 mg/dL (ref 6–23)
CALCIUM: 8.7 mg/dL (ref 8.4–10.5)
CO2: 24 mEq/L (ref 19–32)
Chloride: 109 mEq/L (ref 96–112)
Creatinine, Ser: 0.88 mg/dL (ref 0.50–1.35)
GFR calc non Af Amer: >90 mL/min (ref >90)
GLUCOSE: 121 mg/dL — AB (ref 70–99)
POTASSIUM: 4.1 meq/L (ref 3.7–5.3)
Sodium: 143 mEq/L (ref 137–147)
TOTAL PROTEIN: 6.8 g/dL (ref 6.0–8.3)
Total Bilirubin: 1.9 mg/dL — ABNORMAL HIGH (ref 0.3–1.2)

## 2013-10-25 LAB — TROPONIN I: Troponin I: <0.3 ng/mL (ref <0.30)

## 2013-10-25 MED ORDER — SINCALIDE 5 MCG IJ SOLR
INTRAMUSCULAR | Status: AC
  Administered 2013-10-25: 2.36 ug
  Filled 2013-10-25: qty 5

## 2013-10-25 MED ORDER — TECHNETIUM TC 99M MEBROFENIN IV KIT
5.0000 | PACK | Freq: Once | INTRAVENOUS | Status: AC | PRN
  Administered 2013-10-25: 5 via INTRAVENOUS

## 2013-10-25 MED ORDER — MORPHINE SULFATE 4 MG/ML IJ SOLN
INTRAMUSCULAR | Status: AC
  Filled 2013-10-25: qty 1

## 2013-10-25 MED ORDER — STERILE WATER FOR INJECTION IJ SOLN
INTRAMUSCULAR | Status: AC
  Filled 2013-10-25: qty 10

## 2013-10-25 MED ORDER — MORPHINE SULFATE 4 MG/ML IJ SOLN: 2.3000 mg | Freq: Once | INTRAMUSCULAR | Status: DC

## 2013-10-25 MED ORDER — MORPHINE SULFATE 2 MG/ML IJ SOLN
2.0000 mg | Freq: Once | INTRAMUSCULAR | Status: AC
  Administered 2013-10-25: 2 mg via INTRAVENOUS
  Filled 2013-10-25: qty 1

## 2013-10-25 NOTE — Progress Notes (Signed)
Patient complaining of pain 10 out of 10 after receiving 2mg  of IV morphine at 0152. MD was notified. MD ordered a one time dose of 2mg  IV morphine. Morphine received @ 0356. Will continue to monitor the patient.

## 2013-10-25 NOTE — Care Management Note (Addendum)
    Page 1 of 1   10/27/2013     1:01:34 PM   CARE MANAGEMENT NOTE 10/27/2013  Patient:  LLIAM, HOH   Account Number:  [id number]  Date Initiated:  10/25/2013  Documentation initiated by:  Theophilus Kinds  Subjective/Objective Assessment:   Pt admitted from home with dyspnea, cirrhosis. Pt lives with his wife and will return home at discharge. Pt is independent with ADL's.     Action/Plan:   Financial counselor is aware of self pay status. Pt is eligible for 100% discount with [facility]. Pt stated he gets his PCP care at the [facility] and pays for his medications.   Anticipated DC Date:  10/28/2013   Anticipated DC Plan:  [place]  CM consult      Choice offered to / List presented to:             Status of service:  Completed, signed off Medicare Important Message given?   (If response is "NO", the following Medicare IM given date fields will be blank) Date Medicare IM given:   Date Additional Medicare IM given:    Discharge Disposition:  HOME/SELF CARE  Per UR Regulation:    If discussed at Long Length of Stay Meetings, dates discussed:    Comments:  10/27/13 [place], RN BSN CM Pt discharged home today. No CM needs noted. Pt to follow up with [facility] and outpt providers.  10/25/13 [place], RN BSN CM

## 2013-10-25 NOTE — Progress Notes (Signed)
Patient seen and examined this morning. Agree with assessment and plan outlined.  Patient has ascites on liver US but clinically does not appear distended. Has RUQ tenderness to palpation and although [place] does not  murphy's sign, acute cholecystitis cannot be ruled out since there is cholelithiasis and gall bladder thickening. Agree with GI consult ( informs following with Dr Oneida Alar) . Will obtain HIDA scan. R/o SBP. Continue  empiric Rocephin.

## 2013-10-25 NOTE — Progress Notes (Signed)
[facility] PROGRESS NOTE  [name] PJA:[id number] DOB: 27-Apr-1961 DOA: 10/24/2013 PCP: Jacqualine Mau, PA-C  Assessment/Plan: Dyspnea   Much improved this am.Likely due to abdominal distention related to no lasix for 2 days in setting of liver cirrhosis due to hepatitis C. Never had paracentesis done in the past. US abdomen reveals cholelithiasis with mild gallbladder wall thickening. Small polyp along the superior margin of the gallbladder. Hepatic cirrhosis with surrounding ascites. Borderline dilated common bile duct. Chest x-ray is clear. pro BNP only mildly elevated.  Continue  Lasix 20 mg IV every 12 hours. Consider GI consult.  Abdominal pain   slight improvement. Likely multifactorial i.e. Ascites with possible biliary colic.   Liver enzymes are mildly elevated and trending up somewhat.  lipase also mildly elevated to 86. Alkaline phosphatase 115. Abdominal US as above. Will continue Rocephin day #2. He is afebrile and non-toxic appearing.   Liver cirrhosis  Patient has cirrhosis due to chronic hepatitis C, liver enzymes or normal at this time. Ammonia level 140.  Patient will follow up with Dr. Bertell Maria at [facility] on Thursday this week.  Diabetes mellitus  Will continue Lantus and start sliding scale insulin. CBG range 91-142. On clear liquids.  ? UTI  Patient has abnormal UA. Await urine culture. continue Rocephin.   Chest pain  EKG shows prolonged QT interval. Troponin neg x2. No events on tele. No further CP  Pancytopenia  Patient has pancytopenia secondary to liver cirrhosis and splenomegaly. Stable. We'll continue to monitor the CBC in the hospital.    Code Status: DNR Family Communication: none present Disposition Plan: home when ready   Consultants:  none  Procedures:  none  Antibiotics:  Rocephin 10/24/13>>  HPI/Subjective: Lying in bed reports breathing much improved. Continues with RUQ pain/tenderness.   Objective: Filed Vitals:   10/25/13  0540  BP: 124/73  Pulse: 72  Temp: 97.8 F (36.6 C)  Resp: 20    Intake/Output Summary (Last 24 hours) at 10/25/13 0900 Last data filed at 10/25/13 0738  Gross per 24 hour  Intake      0 ml  Output    825 ml  Net   -825 ml   Filed Weights   10/24/13 1743 10/24/13 2215  Weight: 136.079 kg (300 lb) 117 kg (257 lb 15 oz)    Exam:   General:  Obese appears comfortable NAD  Cardiovascular: RRR No MGR 1+ LE edema  Respiratory: normal effort BS clear bilaterally no wheeze no rhonchi  Abdomen: soft +BS throughout. Mildly distended. Moderate tenderness in RUQ with palpation.   Musculoskeletal:  No clubbing or cyanosis  Data Reviewed: Basic Metabolic Panel:  Recent Labs Lab 10/24/13 1810 10/25/13 0348  NA 140 143  K 4.4 4.1  CL 110 109  CO2 22 24  GLUCOSE 124* 121*  BUN 16 16  CREATININE 0.75 0.88  CALCIUM 8.6 8.7   Liver Function Tests:  Recent Labs Lab 10/24/13 1810 10/25/13 0348  AST 181* 207*  ALT 89* 97*  ALKPHOS 123* 115  BILITOT 1.4* 1.9*  PROT 7.0 6.8  ALBUMIN 2.6* 2.7*    Recent Labs Lab 10/24/13 1810  LIPASE 86*    Recent Labs Lab 10/24/13 1914  AMMONIA 140*   CBC:  Recent Labs Lab 10/24/13 1810 10/25/13 0348  WBC 3.3* 3.1*  NEUTROABS 1.8  --   HGB 9.8* 10.2*  HCT 28.9* 29.7*  MCV 94.8 94.9  PLT 64* 57*   Cardiac Enzymes:  Recent Labs  Lab 10/24/13 2149 10/25/13 0348  TROPONINI <0.30 <0.30   BNP (last 3 results)  Recent Labs  10/24/13 2149  PROBNP 374.8*   CBG:  Recent Labs Lab 10/24/13 2239 10/25/13 0026 10/25/13 0737  GLUCAP 91 142* 104*    No results found for this or any previous visit (from the past 240 hour(s)).   Studies: Dg Abd Acute W/chest  10/24/2013   CLINICAL DATA:  Shortness of breath.  Fluid retention.  Hepatitis-C.  EXAM: ACUTE ABDOMEN SERIES (ABDOMEN 2 VIEW & CHEST 1 VIEW)  COMPARISON:  DG CHEST 1V PORT dated 10/05/2013; CT ABD - PELV W/ CM dated 07/22/2013  FINDINGS: The lungs appear  clear.  Cardiac and mediastinal contours normal.  No pleural effusion identified.  No free intraperitoneal gas beneath the hemidiaphragms. The upright view includes from the iliac crests in above, but not the anatomic pelvis. No significant abnormal upper abdominal air-fluid levels are noted.  No dilated bowel.  Gas and stool noted in the colon.  IMPRESSION: 1. Unremarkable bowel gas pattern   Electronically Signed   By: Sherryl Barters M.D.   On: 10/24/2013 19:08   US Abdomen Limited Ruq  10/25/2013   CLINICAL DATA:  Right upper quadrant abdominal pain.  EXAM: US ABDOMEN LIMITED - RIGHT UPPER QUADRANT  COMPARISON:  US ABDOMEN COMPLETE dated 07/24/2013; CT ABD - PELV W/ CM dated 07/22/2013; DG ABD ACUTE W/CHEST dated 10/24/2013  FINDINGS: Gallbladder:  8 mm non shadowing polyp observe superiorly in the gallbladder. 1.8 cm mobile gallstone noted in the gallbladder. Sonographic Murphy's sign absent. Mild gallbladder wall thickening at 3 mm.  Common bile duct:  Diameter: A short segment is seen and measures 6 mm in diameter  Liver:  Nodular margin of the liver with surrounding ascites. No focal liver mass identified.  IMPRESSION: 1. Cholelithiasis with mild gallbladder wall thickening. Correlate clinically in assessing for cholecystitis versus other potential causes of gallbladder wall thickening (hypoproteinemia/hypoalbuminemia). 2. Small polyp along the superior margin of the gallbladder. 3. Hepatic cirrhosis with surrounding ascites. 4. Borderline dilated common bile duct.   Electronically Signed   By: Sherryl Barters M.D.   On: 10/25/2013 07:40    Scheduled Meds: . cefTRIAXone (ROCEPHIN)  IV  1 g Intravenous Q24H  . furosemide  20 mg Intravenous Q12H  . insulin aspart  0-9 Units Subcutaneous TID WC  . insulin glargine  10 Units Subcutaneous QHS  . sodium chloride  3 mL Intravenous Q12H  . spironolactone  50 mg Oral Daily   Continuous Infusions:   Active Problems:   HEPATITIS C, CHRONIC   DM    CIRRHOSIS   Other pancytopenia   Dyspnea    Time spent: 30 minutes    [place] [facility] Pager [phone]. If 7PM-7AM, please contact night-coverage at [email or web address], password [facility] 10/25/2013, 9:00 AM  LOS: 1 day

## 2013-10-25 NOTE — Progress Notes (Signed)
UR chart review completed.  

## 2013-10-26 ENCOUNTER — Encounter (HOSPITAL_COMMUNITY): Payer: Self-pay | Admitting: Gastroenterology

## 2013-10-26 LAB — GLUCOSE, CAPILLARY
GLUCOSE-CAPILLARY: 219 mg/dL — AB (ref 70–99)
Glucose-Capillary: 100 mg/dL — ABNORMAL HIGH (ref 70–99)
Glucose-Capillary: 198 mg/dL — ABNORMAL HIGH (ref 70–99)
Glucose-Capillary: 168 mg/dL — ABNORMAL HIGH (ref 70–99)

## 2013-10-26 LAB — URINE CULTURE: Colony Count: 70000

## 2013-10-26 LAB — COMPREHENSIVE METABOLIC PANEL
ALBUMIN: 2.7 g/dL — AB (ref 3.5–5.2)
ALK PHOS: 124 U/L — AB (ref 39–117)
ALT: 118 U/L — AB (ref 0–53)
AST: 263 U/L — ABNORMAL HIGH (ref 0–37)
BILIRUBIN TOTAL: 2.2 mg/dL — AB (ref 0.3–1.2)
BUN: 22 mg/dL (ref 6–23)
CO2: 28 mEq/L (ref 19–32)
Calcium: 9 mg/dL (ref 8.4–10.5)
Chloride: 101 mEq/L (ref 96–112)
Creatinine, Ser: 0.93 mg/dL (ref 0.50–1.35)
GFR calc Af Amer: >90 mL/min (ref >90)
GFR calc non Af Amer: >90 mL/min (ref >90)
Glucose, Bld: 170 mg/dL — ABNORMAL HIGH (ref 70–99)
POTASSIUM: 4.3 meq/L (ref 3.7–5.3)
SODIUM: 136 meq/L — AB (ref 137–147)
TOTAL PROTEIN: 7.3 g/dL (ref 6.0–8.3)

## 2013-10-26 LAB — NA AND K (SODIUM & POTASSIUM), RAND UR
POTASSIUM UR: 21 meq/L
SODIUM UR: 148 meq/L

## 2013-10-26 MED ORDER — SPIRONOLACTONE 25 MG PO TABS
100.0000 mg | ORAL_TABLET | Freq: Every day | ORAL | Status: DC
  Administered 2013-10-27: 100 mg via ORAL
  Filled 2013-10-26: qty 4

## 2013-10-26 MED ORDER — PANTOPRAZOLE SODIUM 40 MG PO TBEC
40.0000 mg | DELAYED_RELEASE_TABLET | Freq: Every day | ORAL | Status: DC
Start: 2013-10-26 — End: 2013-10-27
  Administered 2013-10-26 – 2013-10-27 (×2): 40 mg via ORAL
  Filled 2013-10-26 (×2): qty 1

## 2013-10-26 MED ORDER — ALBUMIN HUMAN 25 % IV SOLN
25.0000 g | INTRAVENOUS | Status: AC
  Filled 2013-10-26: qty 100

## 2013-10-26 MED ORDER — LACTULOSE 10 GM/15ML PO SOLN
30.0000 g | Freq: Two times a day (BID) | ORAL | Status: DC
  Administered 2013-10-26: 30 g via ORAL
  Filled 2013-10-26: qty 60

## 2013-10-26 MED ORDER — FUROSEMIDE 40 MG PO TABS
40.0000 mg | ORAL_TABLET | Freq: Every day | ORAL | Status: DC
  Administered 2013-10-26 – 2013-10-27 (×2): 40 mg via ORAL
  Filled 2013-10-26 (×2): qty 1

## 2013-10-26 MED ORDER — HYDROXYZINE HCL 10 MG PO TABS
10.0000 mg | ORAL_TABLET | ORAL | Status: DC | PRN
  Administered 2013-10-26 – 2013-10-27 (×4): 10 mg via ORAL
  Filled 2013-10-26: qty 1

## 2013-10-26 MED ORDER — LACTULOSE 10 GM/15ML PO SOLN: 30.0000 g | Freq: Every day | ORAL | Status: DC | PRN

## 2013-10-26 NOTE — Consult Note (Addendum)
REVIEWED. AGREE. Nutrition consult for 4 gm Na/2L fluid restriction. HIDA SHOWS GB EF %5. WOULD CONSIDER ELECTIVE CHOLECYSTECTOMY AS OP IF PT HAS S/SX OF BILIARY COLIC.

## 2013-10-26 NOTE — Progress Notes (Signed)
[facility] PROGRESS NOTE  [name] [id number] DOB: Jul 14, 1961 DOA: 10/24/2013 PCP: Montey Hora  Summary: 53 year old admitted with worsening SOB, abdominal distention, LEE related to running out of lasix, and RUQ pain which is somewhat chronic and hx of gallstones.     Assessment/Plan: Dyspnea   Continues to improve. Likely due to abdominal distention related to no lasix for 2 days in setting of liver cirrhosis due to hepatitis C. Never had paracentesis done in the past. US abdomen reveals cholelithiasis with mild gallbladder wall thickening. Small polyp along the superior margin of the gallbladder. Hepatic cirrhosis with surrounding ascites. Borderline dilated common bile duct.  Chest x-ray is clear. pro BNP only mildly elevated. Volume status -2.5L. Continues with LE edema.  Will continue Lasix 20 mg IV every 12 hours. Requested GI consult.   Abdominal pain  Improved to baseline today. HIDA scan yesterday reveals patent biliary tree.  Prolonged clearance of tracer from the liver and prolonged hepatic retention of tracer suggest a degree of hepatocellular dysfunction. Abnormal gallbladder response to CCK stimulation with a markedly decreased gallbladder ejection fraction of 5%. Evaluated by GI who opine pain not likely biliary in nature. Paracentesis ordered but not enough fluid to tap. Tolerating diet.  Await todays labs. He is afebrile and non-toxic appearing.  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 Ammonia level 140 on admssion. Appreciate GI assistance. Will start lactulose. Paracentesis ordered but not enough fluid to tap.  Patient will follow up with Dr. Bertell Maria at [facility] on Thursday this week.  Diabetes mellitus  Will continue Lantus and start sliding scale insulin. CBG range 100-198.  ? UTI  Patient has abnormal UA.Urine culture with staph aureus. continue Rocephin day #3.   Chest pain  EKG shows prolonged QT interval. Troponin  neg x2. No events on tele. No further CP  Pancytopenia  Patient has pancytopenia secondary to liver cirrhosis and splenomegaly. Stable. We'll continue to monitor the CBC in the hospital.    Code Status: DNR Family Communication: wife at bedside Disposition Plan: home when ready   Consultants:  GI  Procedures:    Antibiotics:  Rocephin 10/24/13>>  HPI/Subjective: Sitting up in bed visiting with wife. Reports breathing much better and RUQ pain improved as well.   Objective: Filed Vitals:   10/26/13 0500  BP: 125/64  Pulse: 71  Temp: 97.5 F (36.4 C)  Resp: 14    Intake/Output Summary (Last 24 hours) at 10/26/13 1146 Last data filed at 10/26/13 0800  Gross per 24 hour  Intake    293 ml  Output   1825 ml  Net  -1532 ml   Filed Weights   10/24/13 1743 10/24/13 2215  Weight: 136.079 kg (300 lb) 117 kg (257 lb 15 oz)    Exam:   General:  Obese NAD  Cardiovascular: RRR No MGR 1+LEE R>L  Respiratory: normal effort BS distant but clear bilaterally  Abdomen: obese soft +BS mild tenderness RUQ to palpation. Non tense.   Musculoskeletal: joints without swelling/erythema  HEENT: slight periorbital edema, EOMI no scleral icterus  Data Reviewed: Basic Metabolic Panel:  Recent Labs Lab 10/24/13 1810 10/25/13 0348  NA 140 143  K 4.4 4.1  CL 110 109  CO2 22 24  GLUCOSE 124* 121*  BUN 16 16  CREATININE 0.75 0.88  CALCIUM 8.6 8.7   Liver Function Tests:  Recent Labs Lab 10/24/13 1810 10/25/13 0348  AST 181* 207*  ALT 89*  97*  ALKPHOS 123* 115  BILITOT 1.4* 1.9*  PROT 7.0 6.8  ALBUMIN 2.6* 2.7*    Recent Labs Lab 10/24/13 1810  LIPASE 86*    Recent Labs Lab 10/24/13 1914  AMMONIA 140*   CBC:  Recent Labs Lab 10/24/13 1810 10/25/13 0348  WBC 3.3* 3.1*  NEUTROABS 1.8  --   HGB 9.8* 10.2*  HCT 28.9* 29.7*  MCV 94.8 94.9  PLT 64* 57*   Cardiac Enzymes:  Recent Labs Lab 10/24/13 2149 10/25/13 0348 10/25/13 0955  TROPONINI  <0.30 <0.30 <0.30   BNP (last 3 results)  Recent Labs  10/24/13 2149  PROBNP 374.8*   CBG:  Recent Labs Lab 10/25/13 1127 10/25/13 1717 10/25/13 2015 10/26/13 0745 10/26/13 1106  GLUCAP 142* 103* 201* 100* 198*    Recent Results (from the past 240 hour(s))  URINE CULTURE     Status: None   Collection Time    10/24/13  6:50 PM      Result Value Ref Range Status   Specimen Description URINE, CLEAN CATCH   Final   Special Requests IMMUNE:COMPROMISED   Final   Culture  Setup Time     Final   Value: 10/24/2013 21:40     Performed at [place]     Final   Value: 70,000 COLONIES/ML     Performed at [facility]   Culture     Final   Value: STAPHYLOCOCCUS AUREUS     Note: RIFAMPIN AND GENTAMICIN SHOULD NOT BE USED AS SINGLE DRUGS FOR TREATMENT OF STAPH INFECTIONS.     Performed at [facility]   Report Status PENDING   Incomplete     Studies: Nm Hepato W/eject Fract  10/25/2013   CLINICAL DATA:  Cholelithiasis, gallbladder wall thickening, question cholecystitis, history hypertension, diabetes, cirrhosis, hepatitis-C  EXAM: NUCLEAR MEDICINE HEPATOBILIARY IMAGING WITH GALLBLADDER EF  TECHNIQUE: Sequential images of the abdomen were obtained out to 60 minutes following intravenous administration of radiopharmaceutical. After slow intravenous infusion of 2.36 micrograms Cholecystokinin, gallbladder ejection fraction was determined.  RADIOPHARMACEUTICALS:  5 mCi Tc-63m Choletec  COMPARISON:  Ultrasound right upper quadrant 10/25/2013  FINDINGS: Prolonged clearance of tracer from bloodstream indicating mild hepatocellular dysfunction.  Prompt excretion of tracer into the biliary tree.  Small bowel is visualized by 13 min.  Initial collection of tracer at the porta hepatis was felt initially to represent the common bile duct but on lateral view is shown to be the gallbladder, appearing early in exam.  Prolonged retention of tracer within liver.   Following CCK administration, poor emptying of tracer from the gallbladder is identified.  Calculated gallbladder ejection fraction is 5%, markedly decreased.  No definite symptoms following CCK administration.  IMPRESSION: Patent biliary tree.  Prolonged clearance of tracer from the liver and prolonged hepatic retention of tracer suggest a degree of hepatocellular dysfunction.  Abnormal gallbladder response to CCK stimulation with a markedly decreased gallbladder ejection fraction of 5%.   Electronically Signed   By: Lavonia Dana M.D.   On: 10/25/2013 17:43   Dg Abd Acute W/chest  10/24/2013   CLINICAL DATA:  Shortness of breath.  Fluid retention.  Hepatitis-C.  EXAM: ACUTE ABDOMEN SERIES (ABDOMEN 2 VIEW & CHEST 1 VIEW)  COMPARISON:  DG CHEST 1V PORT dated 10/05/2013; CT ABD - PELV W/ CM dated 07/22/2013  FINDINGS: The lungs appear clear.  Cardiac and mediastinal contours normal.  No pleural effusion identified.  No free intraperitoneal gas  beneath the hemidiaphragms. The upright view includes from the iliac crests in above, but not the anatomic pelvis. No significant abnormal upper abdominal air-fluid levels are noted.  No dilated bowel.  Gas and stool noted in the colon.  IMPRESSION: 1. Unremarkable bowel gas pattern   Electronically Signed   By: Sherryl Barters M.D.   On: 10/24/2013 19:08   US Abdomen Limited Ruq  10/25/2013   CLINICAL DATA:  Right upper quadrant abdominal pain.  EXAM: US ABDOMEN LIMITED - RIGHT UPPER QUADRANT  COMPARISON:  US ABDOMEN COMPLETE dated 07/24/2013; CT ABD - PELV W/ CM dated 07/22/2013; DG ABD ACUTE W/CHEST dated 10/24/2013  FINDINGS: Gallbladder:  8 mm non shadowing polyp observe superiorly in the gallbladder. 1.8 cm mobile gallstone noted in the gallbladder. Sonographic Murphy's sign absent. Mild gallbladder wall thickening at 3 mm.  Common bile duct:  Diameter: A short segment is seen and measures 6 mm in diameter  Liver:  Nodular margin of the liver with surrounding ascites.  No focal liver mass identified.  IMPRESSION: 1. Cholelithiasis with mild gallbladder wall thickening. Correlate clinically in assessing for cholecystitis versus other potential causes of gallbladder wall thickening (hypoproteinemia/hypoalbuminemia). 2. Small polyp along the superior margin of the gallbladder. 3. Hepatic cirrhosis with surrounding ascites. 4. Borderline dilated common bile duct.   Electronically Signed   By: Sherryl Barters M.D.   On: 10/25/2013 07:40    Scheduled Meds: . albumin human  25 g Intravenous On Call  . cefTRIAXone (ROCEPHIN)  IV  1 g Intravenous Q24H  . furosemide  20 mg Intravenous Q12H  . insulin aspart  0-9 Units Subcutaneous TID WC  . insulin glargine  10 Units Subcutaneous QHS  . lactulose  30 g Oral BID  .  morphine injection  2.3 mg Intravenous Once  . pantoprazole  40 mg Oral Daily  . sodium chloride  3 mL Intravenous Q12H  . spironolactone  50 mg Oral Daily   Continuous Infusions:   Active Problems:   HEPATITIS C, CHRONIC   DM   CIRRHOSIS   Other pancytopenia   Dyspnea    Time spent: 30 minutes    [facility] Pager [phone]. If 7PM-7AM, please contact night-coverage at [email or web address], password [place] 10/26/2013, 11:46 AM  LOS: 2 days

## 2013-10-26 NOTE — Consult Note (Signed)
Referring Provider: Dyanne Carrel, NP/Dr. Dhungel  Primary Care Physician:  Montey Hora Primary Gastroenterologist:  [name]   Date of Admission: 10/24/13 Date of Consultation: 10/26/13  Reason for Consultation:  New-onset ascites, abdominal pain, cholelithiasis  HPI:  .[name] is a 53 year old male well known to our practice with a history of Hep C cirrhosis, chronic abdominal pain, IDA, pancytopenia, known gallstones, last seen as an outpatient with [place] in Aug 2014. Consult placed yesterday, but we were not notified until today. Up-to-date on variceal screening with last EGD in 2013. Due for surveillance in 2016 . Presented with increasing shortness of breath, RLQ discomfort. No prior paracentesis. Noted lower extremity edema. States he had been going to the [facility] due to lower extremity edema. States he ran out of diuretic (lasix) for 3 days, was trying to get up with his PCP for another prescription; this led to increased lower extremity edema and abdominal distension. Feels improved from admission. RLQ discomfort intermittent, waxes and wanes, like a "soreness". Moving a certain way causes discomfort. Onset with abdominal distension. No pain with eating. No N/V. PPI for GERD. Edentulous: hard to chew food up well to swallow. Chronic. No rectal bleeding. No confusion or mental status changes. No lactulose at home but does have a history of hepatic encephalopathy. Last BM 2 days ago, pure water. Ammonia on admission 140. States he eats chicken noodle soup out of the can, likes to put "a little" salt on his food. Eats a lot of microwave meals.   Was scheduled to see  Dr. Patsy Baltimore 3/19 for further consideration of treatment for Hep C genotype 3a.  [place] of abdomen this admission as below, followed by HIDA. Appears outpatient diuretic management included Lasix 20 mg daily and Aldactone 50 mg daily.    Past Medical History  Diagnosis Date  . Hypertension   . Diabetes mellitus    . GERD (gastroesophageal reflux disease)     DEC 2010 EGD/Bx REACTIVE GASTROPATHY, NO VARICES  . Hemorrhoids, internal   . BMI 40.0-44.9, adult OCT 2010 269 LBS    APR 2012 279 LBS AUG 2014 185 LBS  . 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 IV drug abuse REMOTE  . Hepatitis 2010 HEP C    AST 509 ALT 267 ALK PHOS 165 ALB 3.8 NEG IGM HAV/HBSAg  . Gallstone AUG 2012 1 CM  . GERD 10/25/2009  . COPD (chronic obstructive pulmonary disease)   . Hepatitis C   . Pancytopenia 2013  . Splenomegaly 2013  . Other pancytopenia 02/18/2013  . Iron (Fe) deficiency anemia 02/18/2013  . Splenomegaly 02/18/2013    Past Surgical History  Procedure Laterality Date  . Sigmoidoscopy      2001 DR. FLEISCHMAN INTERNAL HERMORRHOIDS  . Upper gastrointestinal endoscopy  DEC 2010    BENIGN POLYPS, GASTRITIS, ?phg  . Knee surgery  RIGHT  . Hemorrhoid surgery    . Esophageal biopsy  09/08/2011    [name]:Moderate gastritis/Polyps, multiple in the body of the stomach    Prior to Admission medications   Medication Sig Start Date End Date Taking? Authorizing Provider  acetaminophen (TYLENOL) 500 MG tablet Take 1,000 mg by mouth daily as needed for headache.   Yes Historical Provider, MD  albuterol (PROVENTIL HFA;VENTOLIN HFA) 108 (90 BASE) MCG/ACT inhaler Inhale 2 puffs into the lungs every 6 (six) hours as needed. Shortness of breath   Yes Historical Provider, MD  esomeprazole ([place]) 40  MG capsule Take 40 mg by mouth 2 (two) times daily before a meal.   Yes Historical Provider, MD  furosemide (LASIX) 20 MG tablet Take 20 mg by mouth daily.   Yes Historical Provider, MD  glipiZIDE (GLUCOTROL) 10 MG tablet Take 10 mg by mouth 2 (two) times daily before a meal.     Yes Historical Provider, MD  insulin glargine (LANTUS) 100 UNIT/ML injection Inject 10 Units into the skin at bedtime.   Yes Historical Provider, MD  oxyCODONE (OXY IR/ROXICODONE) 5 MG immediate release tablet Take 5-10 mg by mouth  every 4 (four) hours as needed for moderate pain or severe pain. Up to 2 tablets every 4 hours as needed for pain.. 10/17/13  Yes Farrel Gobble, MD  spironolactone (ALDACTONE) 50 MG tablet Take 50 mg by mouth daily.   Yes Historical Provider, MD    Current Facility-Administered Medications  Medication Dose Route Frequency Provider Last Rate Last Dose  . 0.9 %  sodium chloride infusion  250 mL Intravenous PRN Oswald Hillock, MD      . cefTRIAXone (ROCEPHIN) 1 g in dextrose 5 % 50 mL IVPB  1 g Intravenous Q24H Oswald Hillock, MD   1 g at 10/25/13 2200  . furosemide (LASIX) injection 20 mg  20 mg Intravenous Q12H Oswald Hillock, MD   20 mg at 10/25/13 2334  . insulin aspart (novoLOG) injection 0-9 Units  0-9 Units Subcutaneous TID WC Oswald Hillock, MD      . insulin glargine (LANTUS) injection 10 Units  10 Units Subcutaneous QHS Oswald Hillock, MD   10 Units at 10/25/13 2218  . morphine 2 MG/ML injection 2 mg  2 mg Intravenous Q4H PRN Oswald Hillock, MD   2 mg at 10/26/13 0603  . morphine 4 MG/ML injection 2.3 mg  2.3 mg Intravenous Once Burnetta Sabin, MD      . ondansetron [facility]) tablet 4 mg  4 mg Oral Q6H PRN Oswald Hillock, MD       Or  . ondansetron (ZOFRAN) injection 4 mg  4 mg Intravenous Q6H PRN Oswald Hillock, MD      . sodium chloride 0.9 % injection 3 mL  3 mL Intravenous Q12H Oswald Hillock, MD   3 mL at 10/26/13 1000  . sodium chloride 0.9 % injection 3 mL  3 mL Intravenous PRN Oswald Hillock, MD      . spironolactone (ALDACTONE) tablet 50 mg  50 mg Oral Daily Oswald Hillock, MD   50 mg at 10/26/13 0908    Allergies as of 10/24/2013  . (No Known Allergies)    Family History  Problem Relation Age of Onset  . Colon cancer Neg Hx   . Anesthesia problems Neg Hx   . Hypotension Neg Hx   . Malignant hyperthermia Neg Hx   . Pseudochol deficiency Neg Hx   . Cancer Father     History   Social History  . Marital Status: Married    Spouse Name: N/A    Number of Children: N/A  . Years of  Education: N/A   Occupational History  . Not on file.   Social History Main Topics  . Smoking status: Never Smoker   . Smokeless tobacco: Never Used  . Alcohol Use: No     Comment: 30 years ago  . Drug Use: Yes    Special: Marijuana, Cocaine     Comment: 30 yrs ago.  Marland Kitchen  Sexual Activity: Yes    Partners: Female    Birth Control/ Protection: Condom     Comment: spouse   Other Topics Concern  . Not on file   Social History Narrative  . No narrative on file    Review of Systems: As mentioned in HPI.   Physical Exam: Vital signs in last 24 hours: Temp:  [97.5 F (36.4 C)-98.3 F (36.8 C)] 97.5 F (36.4 C) (03/18 0500) Pulse Rate:  [68-71] 71 (03/18 0500) Resp:  [14-20] 14 (03/18 0500) BP: (125-137)/(54-74) 125/64 mmHg (03/18 0500) SpO2:  [97 %-100 %] 100 % (03/18 0500) Last BM Date: 10/24/13 General:   Alert,  Well-developed, well-nourished, pleasant and cooperative in NAD Head:  Normocephalic and atraumatic. Eyes:  Sclera clear, no icterus.   Conjunctiva pink. Ears:  Normal auditory acuity. Nose:  No deformity, discharge,  or lesions. Mouth:  Edentulous Neck:  Supple; no masses or thyromegaly. Lungs:  Clear throughout to auscultation.   No wheezes, crackles, or rhonchi. No acute distress. Heart:  S1 S2 present Abdomen:  Right-sided upper/lower quadrant mild tenderness to palpation, +BS, mild distension, non-tense abdomen with mild fluid wave Rectal:  Deferred until time of colonoscopy.   Msk:  Symmetrical without gross deformities. Normal posture. Extremities:  Trace pretibial edema Neurologic:  Alert and  oriented x4;  grossly normal neurologically. Negative asterixis Skin:  Intact without significant lesions or rashes. Cervical Nodes:  No significant cervical adenopathy. Psych:  Alert and cooperative. Normal mood and affect.  Intake/Output from previous day: 03/17 0701 - 03/18 0700 In: 53 [I.V.:3; IV Piggyback:50] Out: 2350 [Urine:2350] Intake/Output this  shift:    Lab Results:  Recent Labs  10/24/13 1810 10/25/13 0348  WBC 3.3* 3.1*  HGB 9.8* 10.2*  HCT 28.9* 29.7*  PLT 64* 57*   BMET  Recent Labs  10/24/13 1810 10/25/13 0348  NA 140 143  K 4.4 4.1  CL 110 109  CO2 22 24  GLUCOSE 124* 121*  BUN 16 16  CREATININE 0.75 0.88  CALCIUM 8.6 8.7   LFT  Recent Labs  10/24/13 1810 10/25/13 0348  PROT 7.0 6.8  ALBUMIN 2.6* 2.7*  AST 181* 207*  ALT 89* 97*  ALKPHOS 123* 115  BILITOT 1.4* 1.9*    Studies/Results: Nm Hepato W/eject Fract  10/25/2013   CLINICAL DATA:  Cholelithiasis, gallbladder wall thickening, question cholecystitis, history hypertension, diabetes, cirrhosis, hepatitis-C  EXAM: NUCLEAR MEDICINE HEPATOBILIARY IMAGING WITH GALLBLADDER EF  TECHNIQUE: Sequential images of the abdomen were obtained out to 60 minutes following intravenous administration of radiopharmaceutical. After slow intravenous infusion of 2.36 micrograms Cholecystokinin, gallbladder ejection fraction was determined.  RADIOPHARMACEUTICALS:  5 mCi Tc-63m Choletec  COMPARISON:  Ultrasound right upper quadrant 10/25/2013  FINDINGS: Prolonged clearance of tracer from bloodstream indicating mild hepatocellular dysfunction.  Prompt excretion of tracer into the biliary tree.  Small bowel is visualized by 13 min.  Initial collection of tracer at the porta hepatis was felt initially to represent the common bile duct but on lateral view is shown to be the gallbladder, appearing early in exam.  Prolonged retention of tracer within liver.  Following CCK administration, poor emptying of tracer from the gallbladder is identified.  Calculated gallbladder ejection fraction is 5%, markedly decreased.  No definite symptoms following CCK administration.  IMPRESSION: Patent biliary tree.  Prolonged clearance of tracer from the liver and prolonged hepatic retention of tracer suggest a degree of hepatocellular dysfunction.  Abnormal gallbladder response to CCK stimulation  with a markedly  decreased gallbladder ejection fraction of 5%.   Electronically Signed   By: Lavonia Dana M.D.   On: 10/25/2013 17:43   Dg Abd Acute W/chest  10/24/2013   CLINICAL DATA:  Shortness of breath.  Fluid retention.  Hepatitis-C.  EXAM: ACUTE ABDOMEN SERIES (ABDOMEN 2 VIEW & CHEST 1 VIEW)  COMPARISON:  DG CHEST 1V PORT dated 10/05/2013; CT ABD - PELV W/ CM dated 07/22/2013  FINDINGS: The lungs appear clear.  Cardiac and mediastinal contours normal.  No pleural effusion identified.  No free intraperitoneal gas beneath the hemidiaphragms. The upright view includes from the iliac crests in above, but not the anatomic pelvis. No significant abnormal upper abdominal air-fluid levels are noted.  No dilated bowel.  Gas and stool noted in the colon.  IMPRESSION: 1. Unremarkable bowel gas pattern   Electronically Signed   By: Sherryl Barters M.D.   On: 10/24/2013 19:08   US Abdomen Limited Ruq  10/25/2013   CLINICAL DATA:  Right upper quadrant abdominal pain.  EXAM: US ABDOMEN LIMITED - RIGHT UPPER QUADRANT  COMPARISON:  US ABDOMEN COMPLETE dated 07/24/2013; CT ABD - PELV W/ CM dated 07/22/2013; DG ABD ACUTE W/CHEST dated 10/24/2013  FINDINGS: Gallbladder:  8 mm non shadowing polyp observe superiorly in the gallbladder. 1.8 cm mobile gallstone noted in the gallbladder. Sonographic Murphy's sign absent. Mild gallbladder wall thickening at 3 mm.  Common bile duct:  Diameter: A short segment is seen and measures 6 mm in diameter  Liver:  Nodular margin of the liver with surrounding ascites. No focal liver mass identified.  IMPRESSION: 1. Cholelithiasis with mild gallbladder wall thickening. Correlate clinically in assessing for cholecystitis versus other potential causes of gallbladder wall thickening (hypoproteinemia/hypoalbuminemia). 2. Small polyp along the superior margin of the gallbladder. 3. Hepatic cirrhosis with surrounding ascites. 4. Borderline dilated common bile duct.   Electronically Signed   By:  Sherryl Barters M.D.   On: 10/25/2013 07:40    Impression: 53 year old male admitted with worsening shortness of breath, abdominal distension, lower extremity edema in the setting of missed dosing of diuretics; clinically, low concern for SBP. He has chronic right-sided abdominal pain and a known history of gallstones. His pain does not appear to be biliary in nature, as he denies worsening with eating, any associated N/V, and actually endorses worsening discomfort with movement/exertion, and abdominal distension. Likely multifactorial. [place] with possible paracentesis is scheduled for today; agree with fluid analysis. Add cytology. Clinically, he has improved with diuretic therapy this admission. Last weight on file from 3/16; needs daily weights.   Needs to remain on strict diuretic regimen with 2 gram sodium diet. May need titration of lasix/aldactone combo. Appears he is inadvertently not complying with dietary recommendations. Will obtain nutrition consult. Also recommend Lactulose dosing to achieve 2-3 soft bowel movements daily.   Plan: Weights daily Strict I/Os 2 gram sodium diet Nutrition consult for dietary support Recheck CMP Add PPI for GI prophylaxis Paracentesis planned if adequate fluid and fluid sent for analysis; needs Albumin 25 g IV if greater than 5 liters removed.  Lactulose dosing to achieve 2-3 soft bowel movements daily Outpatient follow-up for hep C with Dr. Patsy Baltimore Next EGD for surveillance in 2016 Needs initial screening colonoscopy as outpatient   Orvil Feil, ANP-BC [facility]      LOS: 2 days    10/26/2013, 9:12 AM

## 2013-10-26 NOTE — Progress Notes (Addendum)
   I agree with the History/assesment & plan per Midlevel provider as per above Further details as follows:-     52 year old known chronic alcoholism, hepatitis C [had received injection therapy treatment by Dr. Bertell Maria, admitted 3/16 with worsening shortness of breath and bilateral lower extremity edema.  It was initially thought to vision may have cholelithiasis/cholecystitis in addition to ascites however abdominal ultrasound performed   3/18 and HIDA scan 3/17 instead showed false-positive of hepatocellular dysfunction, but with a decreased ejection fraction   - increase Aldactone dosage to the desired 5: 2 ratio with Lasix. Agree with IV diuresis today-changed to oral Lasix this p.m. -Elective cholecystectomy will be entertained as an outpatient-patient is a very poor operative candidate - Plan on discharge on 100 milligrams Aldactone, 40 Lasix a.m. Would also check a urine sodium/Urine Potassium ratio. A random urine sodium-potassium ratio >1 predicts patient is losing enough salt. -Repeat INR and CBC and Chem-12 in a.m. -Added Atarax for itching -As no fever and abdominal pain improved, will discontinue IV Rocephin-This would also cover Rx for a simple cystitis which was unlikely on admission as no LE, Nitrites -Patient has been instructed on low-salt diet--no evidence of sodium is 120 or less to restrict fluid as this would interfere with splanchnic circulation and could prompt hypovolemia the patient was already on dual diuretic therapy   Verneita Griffes, MD [facility] [phone]

## 2013-10-26 NOTE — Plan of Care (Signed)
Problem: Food- and Nutrition-Related Knowledge Deficit (NB-1.1) Goal: Nutrition education Formal process to instruct or train a patient/client in a skill or to impart knowledge to help patients/clients voluntarily manage or modify food choices and eating behavior to maintain or improve health. Outcome: Adequate for Discharge Nutrition Education Note  RD consulted for nutrition education for low sodium diet.  RD provided "Low Sodium Nutrition Therapy" handout from the Academy of [employer]. Reviewed patient's dietary recall. Provided examples on ways to decrease sodium intake in diet. Discouraged intake of processed foods and use of salt shaker. Encouraged fresh fruits and vegetables as well as whole grain sources of carbohydrates to maximize fiber intake.   RD discussed why it is important for patient to adhere to diet recommendations, and emphasized the role of fluids, foods to avoid, and importance of weighing self daily. Teach back method used.  Expect fair compliance.  Body mass index is 41.08 kg/(m^2). Pt meets criteria for extremeobesity, class III based on current BMI.  Current diet order is 2 gm NA, patient is consuming approximately 100% of meals at this time. Labs and medications reviewed. No further nutrition interventions warranted at this time. RD contact information provided. If additional nutrition issues arise, please re-consult RD.   [name], RD, LDN Pager: [phone]

## 2013-10-27 ENCOUNTER — Telehealth (HOSPITAL_COMMUNITY): Payer: Self-pay | Admitting: *Deleted

## 2013-10-27 ENCOUNTER — Other Ambulatory Visit (HOSPITAL_COMMUNITY): Payer: Self-pay | Admitting: Hematology and Oncology

## 2013-10-27 LAB — COMPREHENSIVE METABOLIC PANEL
ALBUMIN: 2.4 g/dL — AB (ref 3.5–5.2)
ALK PHOS: 110 U/L (ref 39–117)
ALT: 98 U/L — ABNORMAL HIGH (ref 0–53)
AST: 212 U/L — ABNORMAL HIGH (ref 0–37)
BUN: 26 mg/dL — ABNORMAL HIGH (ref 6–23)
CO2: 28 mEq/L (ref 19–32)
CREATININE: 0.98 mg/dL (ref 0.50–1.35)
Calcium: 8.4 mg/dL (ref 8.4–10.5)
Chloride: 104 mEq/L (ref 96–112)
GFR calc Af Amer: >90 mL/min (ref >90)
GFR calc non Af Amer: >90 mL/min (ref >90)
Glucose, Bld: 157 mg/dL — ABNORMAL HIGH (ref 70–99)
POTASSIUM: 4.4 meq/L (ref 3.7–5.3)
Sodium: 139 mEq/L (ref 137–147)
TOTAL PROTEIN: 6.4 g/dL (ref 6.0–8.3)
Total Bilirubin: 1.5 mg/dL — ABNORMAL HIGH (ref 0.3–1.2)

## 2013-10-27 LAB — CBC
HEMATOCRIT: 27.9 % — AB (ref 39.0–52.0)
HEMOGLOBIN: 9.6 g/dL — AB (ref 13.0–17.0)
MCH: 32.2 pg (ref 26.0–34.0)
MCHC: 34.4 g/dL (ref 30.0–36.0)
MCV: 93.6 fL (ref 78.0–100.0)
Platelets: 53 10*3/uL — ABNORMAL LOW (ref 150–400)
RBC: 2.98 MIL/uL — AB (ref 4.22–5.81)
RDW: 15.4 % (ref 11.5–15.5)
WBC: 2.5 10*3/uL — AB (ref 4.0–10.5)

## 2013-10-27 LAB — PROTIME-INR
INR: 1.32 (ref 0.00–1.49)
PROTHROMBIN TIME: 16.1 s — AB (ref 11.6–15.2)

## 2013-10-27 LAB — GLUCOSE, CAPILLARY
GLUCOSE-CAPILLARY: 174 mg/dL — AB (ref 70–99)
Glucose-Capillary: 136 mg/dL — ABNORMAL HIGH (ref 70–99)

## 2013-10-27 MED ORDER — FUROSEMIDE 40 MG PO TABS: 40.0000 mg | ORAL_TABLET | Freq: Every day | ORAL | Status: DC

## 2013-10-27 MED ORDER — HYDROXYZINE HCL 10 MG PO TABS: 10.0000 mg | ORAL_TABLET | ORAL | Status: DC | PRN

## 2013-10-27 MED ORDER — OXYCODONE HCL 5 MG PO TABS: ORAL_TABLET | ORAL | Status: DC

## 2013-10-27 MED ORDER — SPIRONOLACTONE 50 MG PO TABS: 100.0000 mg | ORAL_TABLET | Freq: Every day | ORAL | Status: DC

## 2013-10-27 MED ORDER — LACTULOSE 10 GM/15ML PO SOLN: 30.0000 g | Freq: Two times a day (BID) | ORAL | Status: DC | PRN

## 2013-10-27 MED ORDER — FUROSEMIDE 20 MG PO TABS: 40.0000 mg | ORAL_TABLET | Freq: Every day | ORAL | Status: DC

## 2013-10-27 MED ORDER — LACTULOSE 10 GM/15ML PO SOLN
30.0000 g | Freq: Two times a day (BID) | ORAL | Status: DC
  Administered 2013-10-27: 30 g via ORAL
  Filled 2013-10-27: qty 60

## 2013-10-27 NOTE — Discharge Summary (Signed)
Physician Discharge Summary  [name] [id number] DOB: Feb 12, 1961 DOA: 10/24/2013  PCP: Jacqualine Mau, PA-C  Admit date: 10/24/2013 Discharge date: 10/27/2013  Time spent: 40 minutes  Recommendations for Outpatient Follow-up:  1. Follow up with Dr. Patsy Baltimore as scheduled 2. Has appointment with GI 11/03/13 3. Follow low salt diet as instructed  Discharge Diagnoses:  Principal Problem:   CIRRHOSIS Active Problems:   HEPATITIS C, CHRONIC   DM   GERD   Other pancytopenia   Dyspnea   Discharge Condition: stable  Diet recommendation: 2gm sodium  Filed Weights   10/24/13 1743 10/24/13 2215 10/26/13 1158  Weight: 136.079 kg (300 lb) 117 kg (257 lb 15 oz) 126.236 kg (278 lb 4.8 oz)    History of present illness:  53 year old male admitted 10/24/13 with chief complaint of abdominal pain, shortness of breath going on for previous 3 days. Patient has a history of liver cirrhosis, chronic hepatitis C and takes furosemide at home along with Aldactone, as per patient he ran out of Lasix and had not taken this medication for prior 2 days. He  noticed increasing swelling in the legs, shortness of breath and abdominal distention. Patient has a history of gallstones, but was not operated as he did not have cholecystitis. He denied nausea vomiting had 2 episodes of loose bowel movements. He denied fever or dysuria but admited to having cough for previous 2 days. He also complained of intermittent chest pain associated with radiation to the left arm. He does not have history of CAD.  In the ED patient found to have mild elevation of ammonia, abnormal UA.   Hospital Course:  Dyspnea 2/2 to Uncontrolled   Likely due to abdominal distention related to no lasix for 2 days in setting of liver cirrhosis due to hepatitis C. Never had paracentesis done in the past.  US abdomen reveals cholelithiasis with mild gallbladder wall thickening. Small polyp along the superior margin of the gallbladder.  Hepatic cirrhosis with surrounding ascites. Borderline dilated common bile duct.  Paracentesis ordered but not enough fluid to tap. At discharge pain is at baseline and patient is tolerating diet. He remained afebrile and non-toxic appearing appearing during this hospitalization. Chest x-ray is clear.  pro BNP only mildly elevated. Provided with IV lasix.  Diuresed well. Educated to 2gm sodium diet.  Volume status -4L at discharge. LE edema improved at discharge.  Home dose lasix increased to 40mg  daily. Aldactone increased to 100mg  daily.   Has follow up appointment with Dr Patsy Baltimore, Hepatology  Abdominal pain  Likely related to above. Chronic component as well.  HIDA scan reveals patent biliary tree.    Prolonged clearance of tracer from the liver and prolonged hepatic retention of tracer suggest a degree of hepatocellular dysfunction.  Abnormal gallbladder response to CCK stimulation with a markedly decreased gallbladder ejection fraction of 5%.   GI recommended 4gm sodium diet and 2L fluid restriction and may need to consider elective cholecystectomy as OP if has s/sx of biliary colic.  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  Ammonia level 140 on admssion. Lactulose started and will be continued at discharge.   Patient will follow up with Dr. Bertell Maria at [facility].  Diabetes mellitus   Remained controled during hospitalization. CBG range 100-198.  ? UTI  Patient has abnormal UA.Urine culture with staph aureus. Received rocephin for three days. He remained afebrile and non-toxic appearing  Chest pain  EKG shows prolonged QT interval. Troponin neg  x2. No events on tele. No further CP  Pancytopenia  Patient has pancytopenia secondary to liver cirrhosis and splenomegaly. Stable.   Procedures:  Hida scan 10/25/13 revealsPatent biliary tree.  Prolonged clearance of tracer from the liver and prolonged hepatic  retention of tracer suggest a degree of hepatocellular  dysfunction.  Abnormal gallbladder response to CCK stimulation with a markedly  decreased gallbladder ejection fraction of 5%.  Consultations:  [name] gastroenterology  Discharge Exam: Filed Vitals:   10/27/13 0424  BP: 131/70  Pulse: 72  Temp: 97.8 F (36.6 C)  Resp: 20    General: calm appears comfortable Cardiovascular: RRR No MGR trace LE edema PPP Respiratory: normal effort BS clear bilaterally no wheeze Abdomen: obese soft +BS mild tenderness RUQ on palpation.   Discharge Instructions      Discharge Orders   Future Appointments Provider Department Dept Phone   11/03/2013 10:30 AM Danie Binder, MD [facility] [phone]   12/27/2013 10:50 AM [place] [phone]   12/27/2013 11:00 AM [facility] Provider [place] [phone]   Future Orders Complete By Expires   Diet - low sodium heart healthy  As directed    Discharge instructions  As directed    Comments:     Take medications as directed Follow diet as instructed Follow up with PCP in 1 week   Increase activity slowly  As directed        Medication List         acetaminophen 500 MG tablet  Commonly known as:  TYLENOL  Take 1,000 mg by mouth daily as needed for headache.     albuterol 108 (90 BASE) MCG/ACT inhaler  Commonly known as:  PROVENTIL HFA;VENTOLIN HFA  Inhale 2 puffs into the lungs every 6 (six) hours as needed. Shortness of breath     esomeprazole 40 MG capsule  Commonly known as:  NEXIUM  Take 40 mg by mouth 2 (two) times daily before a meal.     furosemide 20 MG tablet  Commonly known as:  LASIX  Take 2 tablets (40 mg total) by mouth daily.     glipiZIDE 10 MG tablet  Commonly known as:  GLUCOTROL  Take 10 mg by mouth 2 (two) times daily before a meal.     hydrOXYzine 10 MG tablet  Commonly known as:  ATARAX/VISTARIL  Take 1 tablet (10 mg total) by mouth every 4 (four) hours as needed for itching.      insulin glargine 100 UNIT/ML injection  Commonly known as:  LANTUS  Inject 10 Units into the skin at bedtime.     lactulose 10 GM/15ML solution  Commonly known as:  CHRONULAC  Take 45 mLs (30 g total) by mouth 2 (two) times daily as needed for mild constipation.     oxyCODONE 5 MG immediate release tablet  Commonly known as:  Oxy IR/ROXICODONE  Take 5-10 mg by mouth every 4 (four) hours as needed for moderate pain or severe pain. Up to 2 tablets every 4 hours as needed for pain.Marland Kitchen     spironolactone 50 MG tablet  Commonly known as:  ALDACTONE  Take 2 tablets (100 mg total) by mouth daily.       No Known Allergies    The results of significant diagnostics from this hospitalization (including imaging, microbiology, ancillary and laboratory) are listed below for reference.    Significant Diagnostic Studies: Nm Hepato W/eject Fract  10/25/2013   CLINICAL  DATA:  Cholelithiasis, gallbladder wall thickening, question cholecystitis, history hypertension, diabetes, cirrhosis, hepatitis-C  EXAM: NUCLEAR MEDICINE HEPATOBILIARY IMAGING WITH GALLBLADDER EF  TECHNIQUE: Sequential images of the abdomen were obtained out to 60 minutes following intravenous administration of radiopharmaceutical. After slow intravenous infusion of 2.36 micrograms Cholecystokinin, gallbladder ejection fraction was determined.  RADIOPHARMACEUTICALS:  5 mCi Tc-71m Choletec  COMPARISON:  Ultrasound right upper quadrant 10/25/2013  FINDINGS: Prolonged clearance of tracer from bloodstream indicating mild hepatocellular dysfunction.  Prompt excretion of tracer into the biliary tree.  Small bowel is visualized by 13 min.  Initial collection of tracer at the porta hepatis was felt initially to represent the common bile duct but on lateral view is shown to be the gallbladder, appearing early in exam.  Prolonged retention of tracer within liver.  Following CCK administration, poor emptying of tracer from the gallbladder is  identified.  Calculated gallbladder ejection fraction is 5%, markedly decreased.  No definite symptoms following CCK administration.  IMPRESSION: Patent biliary tree.  Prolonged clearance of tracer from the liver and prolonged hepatic retention of tracer suggest a degree of hepatocellular dysfunction.  Abnormal gallbladder response to CCK stimulation with a markedly decreased gallbladder ejection fraction of 5%.   Electronically Signed   By: Lavonia Dana M.D.   On: 10/25/2013 17:43   US Abdomen Limited  10/26/2013   CLINICAL DATA:  Evaluate for ascites.  Possible paracentesis.  EXAM: LIMITED ABDOMEN ULTRASOUND FOR ASCITES  TECHNIQUE: Limited ultrasound survey for ascites was performed in all four abdominal quadrants.  COMPARISON:  Abdominal ultrasound 07/24/2013.  FINDINGS: Limited abdominal ultrasound demonstrated only a small amount of ascites adjacent to the spleen in the left upper quadrant, insufficient volume to safely perform paracentesis. There is also small volume in the left lower quadrant which was also insufficient for paracentesis.  IMPRESSION: 1. Small volume of ascites, insufficient to safely perform paracentesis.   Electronically Signed   By: Vinnie Langton M.D.   On: 10/26/2013 18:01   Dg Chest Portable 1 View  10/05/2013   CLINICAL DATA:  Chest pain.  Shortness of breath.  Weight gain.  EXAM: PORTABLE CHEST - 1 VIEW  COMPARISON:  07/22/2013  FINDINGS: Low lung volumes noted. Both lungs remain clear. Heart size remains within normal limits. The visualized skeletal structures are unremarkable.  IMPRESSION: Low lung volumes.  No active disease.   Electronically Signed   By: Earle Gell M.D.   On: 10/05/2013 12:53   Dg Abd Acute W/chest  10/24/2013   CLINICAL DATA:  Shortness of breath.  Fluid retention.  Hepatitis-C.  EXAM: ACUTE ABDOMEN SERIES (ABDOMEN 2 VIEW & CHEST 1 VIEW)  COMPARISON:  DG CHEST 1V PORT dated 10/05/2013; CT ABD - PELV W/ CM dated 07/22/2013  FINDINGS: The lungs appear  clear.  Cardiac and mediastinal contours normal.  No pleural effusion identified.  No free intraperitoneal gas beneath the hemidiaphragms. The upright view includes from the iliac crests in above, but not the anatomic pelvis. No significant abnormal upper abdominal air-fluid levels are noted.  No dilated bowel.  Gas and stool noted in the colon.  IMPRESSION: 1. Unremarkable bowel gas pattern   Electronically Signed   By: Sherryl Barters M.D.   On: 10/24/2013 19:08   US Abdomen Limited Ruq  10/25/2013   CLINICAL DATA:  Right upper quadrant abdominal pain.  EXAM: US ABDOMEN LIMITED - RIGHT UPPER QUADRANT  COMPARISON:  US ABDOMEN COMPLETE dated 07/24/2013; CT ABD - PELV W/ CM dated 07/22/2013; DG ABD  ACUTE W/CHEST dated 10/24/2013  FINDINGS: Gallbladder:  8 mm non shadowing polyp observe superiorly in the gallbladder. 1.8 cm mobile gallstone noted in the gallbladder. Sonographic Murphy's sign absent. Mild gallbladder wall thickening at 3 mm.  Common bile duct:  Diameter: A short segment is seen and measures 6 mm in diameter  Liver:  Nodular margin of the liver with surrounding ascites. No focal liver mass identified.  IMPRESSION: 1. Cholelithiasis with mild gallbladder wall thickening. Correlate clinically in assessing for cholecystitis versus other potential causes of gallbladder wall thickening (hypoproteinemia/hypoalbuminemia). 2. Small polyp along the superior margin of the gallbladder. 3. Hepatic cirrhosis with surrounding ascites. 4. Borderline dilated common bile duct.   Electronically Signed   By: Sherryl Barters M.D.   On: 10/25/2013 07:40    Microbiology: Recent Results (from the past 240 hour(s))  URINE CULTURE     Status: None   Collection Time    10/24/13  6:50 PM      Result Value Ref Range Status   Specimen Description URINE, CLEAN CATCH   Final   Special Requests IMMUNE:COMPROMISED   Final   Culture  Setup Time     Final   Value: 10/24/2013 21:40     Performed at [facility]     Final   Value: 70,000 COLONIES/ML     Performed at [facility]   Culture     Final   Value: STAPHYLOCOCCUS AUREUS     Note: RIFAMPIN AND GENTAMICIN SHOULD NOT BE USED AS SINGLE DRUGS FOR TREATMENT OF STAPH INFECTIONS.     Performed at [facility]   Report Status 10/26/2013 FINAL   Final   Organism ID, Bacteria STAPHYLOCOCCUS AUREUS   Final     Labs: Basic Metabolic Panel:  Recent Labs Lab 10/24/13 1810 10/25/13 0348 10/26/13 1224 10/27/13 0502  NA 140 143 136* 139  K 4.4 4.1 4.3 4.4  CL 110 109 101 104  CO2 22 24 28 28   GLUCOSE 124* 121* 170* 157*  BUN 16 16 22  26*  CREATININE 0.75 0.88 0.93 0.98  CALCIUM 8.6 8.7 9.0 8.4   Liver Function Tests:  Recent Labs Lab 10/24/13 1810 10/25/13 0348 10/26/13 1224 10/27/13 0502  AST 181* 207* 263* 212*  ALT 89* 97* 118* 98*  ALKPHOS 123* 115 124* 110  BILITOT 1.4* 1.9* 2.2* 1.5*  PROT 7.0 6.8 7.3 6.4  ALBUMIN 2.6* 2.7* 2.7* 2.4*    Recent Labs Lab 10/24/13 1810  LIPASE 86*    Recent Labs Lab 10/24/13 1914  AMMONIA 140*   CBC:  Recent Labs Lab 10/24/13 1810 10/25/13 0348 10/27/13 0502  WBC 3.3* 3.1* 2.5*  NEUTROABS 1.8  --   --   HGB 9.8* 10.2* 9.6*  HCT 28.9* 29.7* 27.9*  MCV 94.8 94.9 93.6  PLT 64* 57* 53*   Cardiac Enzymes:  Recent Labs Lab 10/24/13 2149 10/25/13 0348 10/25/13 0955  TROPONINI <0.30 <0.30 <0.30   BNP: BNP (last 3 results)  Recent Labs  10/24/13 2149  PROBNP 374.8*   CBG:  Recent Labs Lab 10/26/13 1106 10/26/13 1638 10/26/13 2136 10/27/13 0718 10/27/13 1126  GLUCAP 198* 168* 219* 136* 174*       Signed:  BLACK,KAREN M  [facility] 10/27/2013, 12:38 PM  I have seen examined and discussed the Plan of care with this patient and with NP I have made necessary ammendments as needed  Verneita Griffes, MD [facility] (P) [phone]

## 2013-10-27 NOTE — Progress Notes (Signed)
REVIEWED. TCS AS OP FOR RECTAL BLEEDING. FLUID AND NA RESTRICTION ON D/C. LACTULOSE PRN. CONTINUE ALDACTONE AND LASIX.

## 2013-10-27 NOTE — Progress Notes (Signed)
IV removed. Discharge instructions reviewed with patient and wife. Understanding verbalized. Ready for discharge home. 

## 2013-10-27 NOTE — Progress Notes (Signed)
Subjective: 1 BM yesterday per pt. No confusion. Right-sided abdominal discomfort at baseline. No reproduction of pain with eating/drinking. Remains unchanged. Tolerating diet. Limited ultrasound with just small volume ascites, insufficient for paracentesis. Weight on admission calculated as 300 and 257. Unclear which one is correct. Weight today 278.   Objective: Vital signs in last 24 hours: Temp:  [97.5 F (36.4 C)-97.9 F (36.6 C)] 97.8 F (36.6 C) (03/19 0424) Pulse Rate:  [68-72] 72 (03/19 0424) Resp:  [16-20] 20 (03/19 0424) BP: (130-135)/(59-70) 131/70 mmHg (03/19 0424) SpO2:  [98 %-100 %] 99 % (03/19 0424) Weight:  [278 lb 4.8 oz (126.236 kg)] 278 lb 4.8 oz (126.236 kg) (03/18 1158) Last BM Date: 10/24/13 General:   Alert and oriented, pleasant Head:  Normocephalic and atraumatic. Eyes:  No icterus, sclera clear. Conjuctiva pink.  Mouth:  Without lesions, mucosa pink and moist.  Heart:  S1, S2 present, no murmurs noted.  Lungs: Clear to auscultation bilaterally, without wheezing, rales, or rhonchi.  Abdomen:  Bowel sounds present, soft, mild TTP RLQ/RUQ, obese, protuberant. Difficult to appreciate HSM due to AP diameter. Extremities:  Without clubbing or edema. Neurologic:  Alert and  oriented x4;  grossly normal neurologically. Negative asterixis.  Psych:  Alert and cooperative. Normal mood and affect.  Intake/Output from previous day: 03/18 0701 - 03/19 0700 In: 720 [P.O.:720] Out: 2200 [Urine:2200] Intake/Output this shift:    Lab Results:  Recent Labs  10/24/13 1810 10/25/13 0348 10/27/13 0502  WBC 3.3* 3.1* 2.5*  HGB 9.8* 10.2* 9.6*  HCT 28.9* 29.7* 27.9*  PLT 64* 57* 53*   BMET  Recent Labs  10/25/13 0348 10/26/13 1224 10/27/13 0502  NA 143 136* 139  K 4.1 4.3 4.4  CL 109 101 104  CO2 24 28 28   GLUCOSE 121* 170* 157*  BUN 16 22 26*  CREATININE 0.88 0.93 0.98  CALCIUM 8.7 9.0 8.4   LFT  Recent Labs  10/25/13 0348 10/26/13 1224  10/27/13 0502  PROT 6.8 7.3 6.4  ALBUMIN 2.7* 2.7* 2.4*  AST 207* 263* 212*  ALT 97* 118* 98*  ALKPHOS 115 124* 110  BILITOT 1.9* 2.2* 1.5*   PT/INR  Recent Labs  10/27/13 0502  LABPROT 16.1*  INR 1.32    Studies/Results: Nm Hepato W/eject Fract  10/25/2013   CLINICAL DATA:  Cholelithiasis, gallbladder wall thickening, question cholecystitis, history hypertension, diabetes, cirrhosis, hepatitis-C  EXAM: NUCLEAR MEDICINE HEPATOBILIARY IMAGING WITH GALLBLADDER EF  TECHNIQUE: Sequential images of the abdomen were obtained out to 60 minutes following intravenous administration of radiopharmaceutical. After slow intravenous infusion of 2.36 micrograms Cholecystokinin, gallbladder ejection fraction was determined.  RADIOPHARMACEUTICALS:  5 mCi Tc-8m Choletec  COMPARISON:  Ultrasound right upper quadrant 10/25/2013  FINDINGS: Prolonged clearance of tracer from bloodstream indicating mild hepatocellular dysfunction.  Prompt excretion of tracer into the biliary tree.  Small bowel is visualized by 13 min.  Initial collection of tracer at the porta hepatis was felt initially to represent the common bile duct but on lateral view is shown to be the gallbladder, appearing early in exam.  Prolonged retention of tracer within liver.  Following CCK administration, poor emptying of tracer from the gallbladder is identified.  Calculated gallbladder ejection fraction is 5%, markedly decreased.  No definite symptoms following CCK administration.  IMPRESSION: Patent biliary tree.  Prolonged clearance of tracer from the liver and prolonged hepatic retention of tracer suggest a degree of hepatocellular dysfunction.  Abnormal gallbladder response to CCK stimulation with a markedly  decreased gallbladder ejection fraction of 5%.   Electronically Signed   By: Lavonia Dana M.D.   On: 10/25/2013 17:43   US Abdomen Limited  10/26/2013   CLINICAL DATA:  Evaluate for ascites.  Possible paracentesis.  EXAM: LIMITED ABDOMEN  ULTRASOUND FOR ASCITES  TECHNIQUE: Limited ultrasound survey for ascites was performed in all four abdominal quadrants.  COMPARISON:  Abdominal ultrasound 07/24/2013.  FINDINGS: Limited abdominal ultrasound demonstrated only a small amount of ascites adjacent to the spleen in the left upper quadrant, insufficient volume to safely perform paracentesis. There is also small volume in the left lower quadrant which was also insufficient for paracentesis.  IMPRESSION: 1. Small volume of ascites, insufficient to safely perform paracentesis.   Electronically Signed   By: Vinnie Langton M.D.   On: 10/26/2013 18:01    Assessment: 53 year old male with Hep C cirrhosis, chronic abdominal pain, IDA, pancytopenia, and known gallstones, admitted with worsening SOB and edema in the setting of missed diuretics. Insufficient ascitic fluid for paracentesis; he has responded well with diuretic therapy this admission. Right-sided abdominal pain chronic in nature without symptoms aggravated by eating; remains constant. Consider outpatient elective surgical consideration for biliary dyskinesia if clinically indicated. Appreciate nutrition consult. MELD 11.   Plan: Titrate lactulose to achieve 2-3 soft BMs daily 2 gram sodium diet PPI daily Lasix 40 mg, Aldactone 100 mg daily Outpatient follow-up for hep C with Dr. Patsy Baltimore  Next EGD for surveillance in 2016  Needs initial screening colonoscopy as outpatient Anticipate discharge home today.  Orvil Feil, ANP-BC [facility]    LOS: 3 days    10/27/2013, 7:58 AM

## 2013-10-27 NOTE — Telephone Encounter (Signed)
Wife called and said [name] needed refill on Oxycodone 5mg   Pain Medication please .  She would pick up script when she came to get him after discharge [facility] the [facility] this afternoon.

## 2013-11-03 ENCOUNTER — Ambulatory Visit (INDEPENDENT_AMBULATORY_CARE_PROVIDER_SITE_OTHER): Payer: Self-pay | Admitting: Gastroenterology

## 2013-11-03 ENCOUNTER — Other Ambulatory Visit: Payer: Self-pay | Admitting: Gastroenterology

## 2013-11-03 ENCOUNTER — Telehealth: Payer: Self-pay | Admitting: Gastroenterology

## 2013-11-03 ENCOUNTER — Encounter: Payer: Self-pay | Admitting: Gastroenterology

## 2013-11-03 ENCOUNTER — Encounter (INDEPENDENT_AMBULATORY_CARE_PROVIDER_SITE_OTHER): Payer: Self-pay

## 2013-11-03 VITALS — BP 134/65 | HR 68 | Temp 97.9°F | Ht 67.0 in | Wt 278.2 lb

## 2013-11-03 DIAGNOSIS — K921 Melena: Secondary | ICD-10-CM

## 2013-11-03 DIAGNOSIS — Z1211 Encounter for screening for malignant neoplasm of colon: Secondary | ICD-10-CM

## 2013-11-03 DIAGNOSIS — K625 Hemorrhage of anus and rectum: Secondary | ICD-10-CM

## 2013-11-03 DIAGNOSIS — K7460 Unspecified cirrhosis of liver: Secondary | ICD-10-CM

## 2013-11-03 DIAGNOSIS — B182 Chronic viral hepatitis C: Secondary | ICD-10-CM

## 2013-11-03 NOTE — Assessment & Plan Note (Signed)
TCS APR 7

## 2013-11-03 NOTE — Assessment & Plan Note (Addendum)
MOST LIKELY DUE TO HEMORRHOIDS OR POLYPS, LESS LIKELY COLON CA OR AVMs  TCS APR 7 WITH MAC DUE TO POLYPHARMACY. FULL LIQUID APR 6 HALF LANTUS ON NIGHT PRIOR TO TCS HOLD GLIPIZIDE ON AM OF TCS OPV IN JUL OR AUG 2015

## 2013-11-03 NOTE — Progress Notes (Signed)
Subjective:    Patient ID: [name], male    DOB: [birth year], 53 y.o.   MRN: [id number]  Jacqualine Mau, PA-C   HPI STOPPED LACTULOSE DUE TO LOOSE STOOLS. BMs: AT LEAST 1-3X/DAY. R LOWER ABD PAIN BETTER. LOSING FLUID DUE TO WATER PILLS. STAYS COLD. SLEEPS A LOT. HAS CRAMPS IN HIS FEET. HOT STARTED WITH PILL AND SHOT. STOPPED RX DUE TO DEPRESSION. LAST APPT WITH DR. ZACKS IN [place]. HAS MEDICAID COMING NEXT MONTH. HAS MEDICARE.  LAST VISIT: 1 YEAR AGO. LAST BRBPR FEB 2015: WHOLE LOT, SEVERAL TIMES. RARE VOMITING: 1-2X/MO. RARE DIARRHEA WITH CERTAIN FOODS. PT DENIES FEVER, CHILLS, nausea, vomiting, melena, constipation, problems swallowing, OR problems with sedation. RARE: heartburn or indigestion: 2-3X/WEEK,..  Past Medical History  Diagnosis Date  . Hypertension   . Diabetes mellitus   . GERD (gastroesophageal reflux disease)     DEC 2010 EGD/Bx REACTIVE GASTROPATHY, NO VARICES  . Hemorrhoids, internal   . BMI 40.0-44.9, adult OCT 2010 269 LBS    APR 2012 279 LBS AUG 2014 185 LBS  . 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 IV drug abuse REMOTE  . Hepatitis 2010 HEP C    AST 509 ALT 267 ALK PHOS 165 ALB 3.8 NEG IGM HAV/HBSAg  . Gallstone AUG 2012 1 CM  . GERD 10/25/2009  . COPD (chronic obstructive pulmonary disease)   . Hepatitis C   . Pancytopenia 2013  . Splenomegaly 2013  . Other pancytopenia 02/18/2013  . Iron (Fe) deficiency anemia 02/18/2013  . Splenomegaly 02/18/2013    Past Surgical History  Procedure Laterality Date  . Sigmoidoscopy      2001 DR. FLEISCHMAN INTERNAL HERMORRHOIDS  . Upper gastrointestinal endoscopy  DEC 2010    BENIGN POLYPS, GASTRITIS, ?phg  . Knee surgery  RIGHT  . Hemorrhoid surgery    . Esophageal biopsy  09/08/2011    [name]:Moderate gastritis/Polyps, multiple in the body of the stomach   No Known Allergies  Current Outpatient Prescriptions  Medication Sig Dispense Refill  . albuterol (PROVENTIL HFA;VENTOLIN  HFA) 108 (90 BASE) MCG/ACT inhaler Inhale 2 puffs into the lungs every 6 (six) hours as needed. Shortness of breath      . esomeprazole (NEXIUM) 40 MG capsule Take 40 mg by mouth 2 (two) times daily before a meal.      . furosemide (LASIX) 20 MG tablet Take 2 tablets (40 mg total) by mouth daily.    Marland Kitchen glipiZIDE (GLUCOTROL) 10 MG tablet Take 10 mg by mouth 2 (two) times daily before a meal.      . hydrOXYzine (ATARAX/VISTARIL) 10 MG tablet Take 1 tablet (10 mg total) by mouth every 4 (four) hours as needed for itching.    . insulin glargine (LANTUS) 100 UNIT/ML injection Inject 10 Units into the skin at bedtime.    Marland Kitchen spironolactone (ALDACTONE) 50 MG tablet Take 2 tablets (100 mg total) by mouth daily.        Review of Systems     Objective:   Physical Exam  Vitals reviewed. Constitutional: He is oriented to person, place, and time. He appears well-nourished. No distress.  HENT:  Head: Normocephalic and atraumatic.  Mouth/Throat: Oropharynx is clear and moist. No oropharyngeal exudate.  Eyes: Pupils are equal, round, and reactive to light. No scleral icterus.  Neck: Normal range of motion. Neck supple.  Cardiovascular: Normal rate and regular rhythm.   Murmur heard. Pulmonary/Chest: Effort normal and breath  sounds normal. No respiratory distress.  Abdominal: Soft. Bowel sounds are normal. He exhibits no distension. There is tenderness. There is no rebound and no guarding.  MILD TTP RLQ  Musculoskeletal: He exhibits edema (TRACE/1+ BIL LE).  Lymphadenopathy:    He has no cervical adenopathy.  Neurological: He is alert and oriented to person, place, and time.  NO FOCAL DEFICITS   Psychiatric:  FLAT AFFECT, NL MOOD           Assessment & Plan:

## 2013-11-03 NOTE — Assessment & Plan Note (Addendum)
MELD SCORE, CHILD PUGH SCORE INCREASING  REFER FOR LIVER TRANSPLANT EVALUATION. REPEAT EGD OPV JUL 2015

## 2013-11-03 NOTE — Telephone Encounter (Signed)
DONE

## 2013-11-03 NOTE — Assessment & Plan Note (Signed)
FAILED TREATMENT WITH RBV/INTERFERON DUE TO SIDE EFFECTS.  FOLLOW UP WITH DR. ZACKS RE: HCV Rx.

## 2013-11-03 NOTE — Progress Notes (Signed)
Reminder in epic °

## 2013-11-03 NOTE — Patient Instructions (Addendum)
FULL LIQUID APR 6  TAKE HALF LANTUS DOSE ON NIGHT PRIOR TO COLONOSCOPY. HOLD GLIPIZIDE ON MORNING OF COLONOSCOPY.  UPPER AND ENDOSCOPY AND COLONOSCOPY  APR 7 .  CONTINUE YOUR WEIGHT LOSS EFFORTS.  FOLLOW UP IN JUL OR AUG 2015

## 2013-11-03 NOTE — Telephone Encounter (Signed)
[name] was calling back regarding having to change appt time at the hospital. Please call her back at [phone]

## 2013-11-07 ENCOUNTER — Other Ambulatory Visit: Payer: Self-pay | Admitting: Gastroenterology

## 2013-11-07 NOTE — Progress Notes (Signed)
Referral was made to the  [facility] in [place], Dr. Virgina Jock

## 2013-11-07 NOTE — Progress Notes (Signed)
cc'd to pcp 

## 2013-11-08 ENCOUNTER — Encounter (HOSPITAL_COMMUNITY): Payer: Self-pay | Admitting: Pharmacy Technician

## 2013-11-08 ENCOUNTER — Encounter (HOSPITAL_COMMUNITY): Payer: Self-pay

## 2013-11-08 ENCOUNTER — Encounter (HOSPITAL_COMMUNITY)
Admission: RE | Admit: 2013-11-08 | Discharge: 2013-11-08 | Disposition: A | Discharge status: Home / Self Care | Payer: Medicaid Other | Source: Ambulatory Visit | Attending: Gastroenterology | Admitting: Gastroenterology

## 2013-11-08 DIAGNOSIS — Z01812 Encounter for preprocedural laboratory examination: Secondary | ICD-10-CM | POA: Diagnosis present

## 2013-11-08 HISTORY — DX: Polyneuropathy, unspecified: G62.9

## 2013-11-08 LAB — CBC WITH DIFFERENTIAL/PLATELET
Basophils Absolute: 0 10*3/uL (ref 0.0–0.1)
Basophils Relative: 0 % (ref 0–1)
Eosinophils Absolute: 0.1 10*3/uL (ref 0.0–0.7)
Eosinophils Relative: 3 % (ref 0–5)
HEMATOCRIT: 32.3 % — AB (ref 39.0–52.0)
Hemoglobin: 11 g/dL — ABNORMAL LOW (ref 13.0–17.0)
LYMPHS ABS: 0.9 10*3/uL (ref 0.7–4.0)
Lymphocytes Relative: 24 % (ref 12–46)
MCH: 31.9 pg (ref 26.0–34.0)
MCHC: 34.1 g/dL (ref 30.0–36.0)
MCV: 93.6 fL (ref 78.0–100.0)
MONOS PCT: 13 % — AB (ref 3–12)
Monocytes Absolute: 0.5 10*3/uL (ref 0.1–1.0)
NEUTROS ABS: 2.1 10*3/uL (ref 1.7–7.7)
Neutrophils Relative %: 60 % (ref 43–77)
Platelets: 60 10*3/uL — ABNORMAL LOW (ref 150–400)
RBC: 3.45 MIL/uL — AB (ref 4.22–5.81)
RDW: 15.1 % (ref 11.5–15.5)
WBC: 3.6 10*3/uL — ABNORMAL LOW (ref 4.0–10.5)

## 2013-11-08 LAB — COMPREHENSIVE METABOLIC PANEL
ALBUMIN: 2.9 g/dL — AB (ref 3.5–5.2)
ALT: 96 U/L — ABNORMAL HIGH (ref 0–53)
AST: 149 U/L — ABNORMAL HIGH (ref 0–37)
Alkaline Phosphatase: 104 U/L (ref 39–117)
BUN: 18 mg/dL (ref 6–23)
CO2: 22 mEq/L (ref 19–32)
CREATININE: 0.75 mg/dL (ref 0.50–1.35)
Calcium: 8.6 mg/dL (ref 8.4–10.5)
Chloride: 104 mEq/L (ref 96–112)
GFR calc Af Amer: >90 mL/min (ref >90)
GFR calc non Af Amer: >90 mL/min (ref >90)
Glucose, Bld: 151 mg/dL — ABNORMAL HIGH (ref 70–99)
Potassium: 4.1 mEq/L (ref 3.7–5.3)
Sodium: 138 mEq/L (ref 137–147)
TOTAL PROTEIN: 7.1 g/dL (ref 6.0–8.3)
Total Bilirubin: 1.8 mg/dL — ABNORMAL HIGH (ref 0.3–1.2)

## 2013-11-08 LAB — PROTIME-INR
INR: 1.2 (ref 0.00–1.49)
Prothrombin Time: 14.9 seconds (ref 11.6–15.2)

## 2013-11-08 NOTE — Progress Notes (Signed)
11/08/13 1301  OBSTRUCTIVE SLEEP APNEA  Have you ever been diagnosed with sleep apnea through a sleep study? No  Do you snore loudly (loud enough to be heard through closed doors)?  0  Do you often feel tired, fatigued, or sleepy during the daytime? 1  Has anyone observed you stop breathing during your sleep? 0  Do you have, or are you being treated for high blood pressure? 1  BMI more than 35 kg/m2? 1  Age over 52 years old? 1  Neck circumference greater than 40 cm/18 inches? 1  Gender: 1  Obstructive Sleep Apnea Score 6  Score 4 or greater  Results sent to PCP

## 2013-11-08 NOTE — Patient Instructions (Signed)
[name]  11/08/2013   Your procedure is scheduled on:  11/15/2013  Report to [facility] at  800  AM.  Call this number if you have problems the morning of surgery: [phone]   Remember:   Do not eat food or drink liquids after midnight.   Take these medicines the morning of surgery with A SIP OF WATER:  Hydroxyzine, aldactone   Do not wear jewelry, make-up or nail polish.  Do not wear lotions, powders, or perfumes.   Do not shave 48 hours prior to surgery. Men may shave face and neck.  Do not bring valuables to the hospital.  [facility] is not responsible for any belongings or valuables.               Contacts, dentures or bridgework may not be worn into surgery.  Leave suitcase in the car. After surgery it may be brought to your room.  For patients admitted to the hospital, discharge time is determined by your treatment team.               Patients discharged the day of surgery will not be allowed to drive home.  Name and phone number of your driver: family  Special Instructions: N/A   Please read over the following fact sheets that you were given: Pain Booklet, Coughing and Deep Breathing, Surgical Site Infection Prevention, Anesthesia Post-op Instructions and Care and Recovery After Surgery Colonoscopy A colonoscopy is an exam to look at the entire large intestine (colon). This exam can help find problems such as tumors, polyps, inflammation, and areas of bleeding. The exam takes about 1 hour.  LET [facility] CARE PROVIDER KNOW ABOUT:   Any allergies you have.  All medicines you are taking, including vitamins, herbs, eye drops, creams, and over-the-counter medicines.  Previous problems you or members of your family have had with the use of anesthetics.  Any blood disorders you have.  Previous surgeries you have had.  Medical conditions you have. RISKS AND COMPLICATIONS  Generally, this is a safe procedure. However, as with any procedure, complications  can occur. Possible complications include:  Bleeding.  Tearing or rupture of the colon wall.  Reaction to medicines given during the exam.  Infection (rare). BEFORE THE PROCEDURE   Ask your health care provider about changing or stopping your regular medicines.  You may be prescribed an oral bowel prep. This involves drinking a large amount of medicated liquid, starting the day before your procedure. The liquid will cause you to have multiple loose stools until your stool is almost clear or light green. This cleans out your colon in preparation for the procedure.  Do not eat or drink anything else once you have started the bowel prep, unless your health care provider tells you it is safe to do so.  Arrange for someone to drive you home after the procedure. PROCEDURE   You will be given medicine to help you relax (sedative).  You will lie on your side with your knees bent.  A long, flexible tube with a light and camera on the end (colonoscope) will be inserted through the rectum and into the colon. The camera sends video back to a computer screen as it moves through the colon. The colonoscope also releases carbon dioxide gas to inflate the colon. This helps your health care provider see the area better.  During the exam, your health care provider may take a small tissue sample (  biopsy) to be examined under a microscope if any abnormalities are found.  The exam is finished when the entire colon has been viewed. AFTER THE PROCEDURE   Do not drive for 24 hours after the exam.  You may have a small amount of blood in your stool.  You may pass moderate amounts of gas and have mild abdominal cramping or bloating. This is caused by the gas used to inflate your colon during the exam.  Ask when your test results will be ready and how you will get your results. Make sure you get your test results. Document Released: 07/25/2000 Document Revised: 05/18/2013 Document Reviewed:  04/04/2013 [facility] Patient Information 2014 [place]. Esophagogastroduodenoscopy Esophagogastroduodenoscopy (EGD) is a procedure to examine the lining of the esophagus, stomach, and first part of the small intestine (duodenum). A long, flexible, lighted tube with a camera attached (endoscope) is inserted down the throat to view these organs. This procedure is done to detect problems or abnormalities, such as inflammation, bleeding, ulcers, or growths, in order to treat them. The procedure lasts about 5 20 minutes. It is usually an outpatient procedure, but it may need to be performed in emergency cases in the hospital. LET YOUR CAREGIVER KNOW ABOUT:   Allergies to food or medicine.  All medicines you are taking, including vitamins, herbs, eyedrops, and over-the-counter medicines and creams.  Use of steroids (by mouth or creams).  Previous problems you or members of your family have had with the use of anesthetics.  Any blood disorders you have.  Previous surgeries you have had.  Other health problems you have.  Possibility of pregnancy, if this applies. RISKS AND COMPLICATIONS  Generally, EGD is a safe procedure. However, as with any procedure, complications can occur. Possible complications include:  Infection.  Bleeding.  Tearing (perforation) of the esophagus, stomach, or duodenum.  Difficulty breathing or not being able to breath.  Excessive sweating.  Spasms of the larynx.  Slowed heartbeat.  Low blood pressure. BEFORE THE PROCEDURE  Do not eat or drink anything for 6 8 hours before the procedure or as directed by your caregiver.  Ask your caregiver about changing or stopping your regular medicines.  If you wear dentures, be prepared to remove them before the procedure.  Arrange for someone to drive you home after the procedure. PROCEDURE   A vein will be accessed to give medicines and fluids. A medicine to relax you (sedative) and a pain reliever will  be given through that access into the vein.  A numbing medicine (local anesthetic) may be sprayed on your throat for comfort and to stop you from gagging or coughing.  A mouth guard may be placed in your mouth to protect your teeth and to keep you from biting on the endoscope.  You will be asked to lie on your left side.  The endoscope is inserted down your throat and into the esophagus, stomach, and duodenum.  Air is put through the endoscope to allow your caregiver to view the lining of your esophagus clearly.  The esophagus, stomach, and duodenum is then examined. During the exam, your caregiver may:  Remove tissue to be examined under a microscope (biopsy) for inflammation, infection, or other medical problems.  Remove growths.  Remove objects (foreign bodies) that are stuck.  Treat any bleeding with medicines or other devices that stop tissues from bleeding (hot cauters, clipping devices).  Widen (dilate) or stretch narrowed areas of the esophagus and stomach.  The endoscope will then be  withdrawn. AFTER THE PROCEDURE  You will be taken to a recovery area to be monitored. You will be able to go home once you are stable and alert.  Do not eat or drink anything until the local anesthetic and numbing medicines have worn off. You may choke.  It is normal to feel bloated, have pain with swallowing, or have a sore throat for a short time. This will wear off.  Your caregiver should be able to discuss his or her findings with you. It will take longer to discuss the test results if any biopsies were taken. Document Released: 11/28/2004 Document Revised: 07/14/2012 Document Reviewed: 06/30/2012 [facility] Patient Information 2014 [place], [place]. PATIENT INSTRUCTIONS POST-ANESTHESIA  IMMEDIATELY FOLLOWING SURGERY:  Do not drive or operate machinery for the first twenty four hours after surgery.  Do not make any important decisions for twenty four hours after surgery or while taking  narcotic pain medications or sedatives.  If you develop intractable nausea and vomiting or a severe headache please notify your doctor immediately.  FOLLOW-UP:  Please make an appointment with your surgeon as instructed. You do not need to follow up with anesthesia unless specifically instructed to do so.  WOUND CARE INSTRUCTIONS (if applicable):  Keep a dry clean dressing on the anesthesia/puncture wound site if there is drainage.  Once the wound has quit draining you may leave it open to air.  Generally you should leave the bandage intact for twenty four hours unless there is drainage.  If the epidural site drains for more than 36-48 hours please call the anesthesia department.  QUESTIONS?:  Please feel free to call your physician or the hospital operator if you have any questions, and they will be happy to assist you.

## 2013-11-08 NOTE — Pre-Procedure Instructions (Signed)
Patient given information to sign up for my chart at home. 

## 2013-11-10 ENCOUNTER — Other Ambulatory Visit: Payer: Self-pay

## 2013-11-10 MED ORDER — ESOMEPRAZOLE MAGNESIUM 40 MG PO CPDR: 40.0000 mg | DELAYED_RELEASE_CAPSULE | Freq: Two times a day (BID) | ORAL | Status: DC

## 2013-11-15 ENCOUNTER — Other Ambulatory Visit (HOSPITAL_COMMUNITY): Payer: Self-pay | Admitting: Hematology and Oncology

## 2013-11-15 ENCOUNTER — Ambulatory Visit (HOSPITAL_COMMUNITY)
Admission: RE | Admit: 2013-11-15 | Discharge: 2013-11-15 | Disposition: A | Discharge status: Home / Self Care | Payer: Medicare Other | Source: Ambulatory Visit | Attending: Gastroenterology | Admitting: Gastroenterology

## 2013-11-15 ENCOUNTER — Ambulatory Visit (HOSPITAL_COMMUNITY): Payer: Medicare Other | Admitting: Anesthesiology

## 2013-11-15 ENCOUNTER — Encounter (HOSPITAL_COMMUNITY)
Admission: RE | Disposition: A | Discharge status: Home / Self Care | Payer: Self-pay | Source: Ambulatory Visit | Attending: Gastroenterology

## 2013-11-15 ENCOUNTER — Encounter (HOSPITAL_COMMUNITY): Payer: Self-pay | Admitting: *Deleted

## 2013-11-15 ENCOUNTER — Ambulatory Visit: Admit: 2013-11-15 | Payer: Self-pay | Admitting: Gastroenterology

## 2013-11-15 ENCOUNTER — Encounter (HOSPITAL_COMMUNITY): Payer: Medicare Other | Admitting: Anesthesiology

## 2013-11-15 DIAGNOSIS — R5383 Other fatigue: Secondary | ICD-10-CM

## 2013-11-15 DIAGNOSIS — J4489 Other specified chronic obstructive pulmonary disease: Secondary | ICD-10-CM | POA: Insufficient documentation

## 2013-11-15 DIAGNOSIS — E119 Type 2 diabetes mellitus without complications: Secondary | ICD-10-CM | POA: Insufficient documentation

## 2013-11-15 DIAGNOSIS — K5520 Angiodysplasia of colon without hemorrhage: Secondary | ICD-10-CM | POA: Insufficient documentation

## 2013-11-15 DIAGNOSIS — I10 Essential (primary) hypertension: Secondary | ICD-10-CM | POA: Insufficient documentation

## 2013-11-15 DIAGNOSIS — E669 Obesity, unspecified: Secondary | ICD-10-CM | POA: Insufficient documentation

## 2013-11-15 DIAGNOSIS — R5381 Other malaise: Secondary | ICD-10-CM

## 2013-11-15 DIAGNOSIS — K2960 Other gastritis without bleeding: Secondary | ICD-10-CM | POA: Insufficient documentation

## 2013-11-15 DIAGNOSIS — K7030 Alcoholic cirrhosis of liver without ascites: Secondary | ICD-10-CM | POA: Insufficient documentation

## 2013-11-15 DIAGNOSIS — Z6841 Body Mass Index (BMI) 40.0 and over, adult: Secondary | ICD-10-CM | POA: Insufficient documentation

## 2013-11-15 DIAGNOSIS — D509 Iron deficiency anemia, unspecified: Secondary | ICD-10-CM

## 2013-11-15 DIAGNOSIS — D131 Benign neoplasm of stomach: Secondary | ICD-10-CM

## 2013-11-15 DIAGNOSIS — I8510 Secondary esophageal varices without bleeding: Secondary | ICD-10-CM | POA: Insufficient documentation

## 2013-11-15 DIAGNOSIS — K649 Unspecified hemorrhoids: Secondary | ICD-10-CM | POA: Insufficient documentation

## 2013-11-15 DIAGNOSIS — K648 Other hemorrhoids: Secondary | ICD-10-CM

## 2013-11-15 DIAGNOSIS — R1011 Right upper quadrant pain: Secondary | ICD-10-CM

## 2013-11-15 DIAGNOSIS — K625 Hemorrhage of anus and rectum: Secondary | ICD-10-CM | POA: Insufficient documentation

## 2013-11-15 DIAGNOSIS — D61818 Other pancytopenia: Secondary | ICD-10-CM

## 2013-11-15 DIAGNOSIS — J449 Chronic obstructive pulmonary disease, unspecified: Secondary | ICD-10-CM | POA: Insufficient documentation

## 2013-11-15 DIAGNOSIS — F1020 Alcohol dependence, uncomplicated: Secondary | ICD-10-CM | POA: Insufficient documentation

## 2013-11-15 DIAGNOSIS — Z7982 Long term (current) use of aspirin: Secondary | ICD-10-CM | POA: Insufficient documentation

## 2013-11-15 DIAGNOSIS — K921 Melena: Secondary | ICD-10-CM

## 2013-11-15 HISTORY — PX: ESOPHAGOGASTRODUODENOSCOPY (EGD) WITH PROPOFOL: SHX5813

## 2013-11-15 HISTORY — PX: COLONOSCOPY WITH PROPOFOL: SHX5780

## 2013-11-15 LAB — GLUCOSE, CAPILLARY: Glucose-Capillary: 91 mg/dL (ref 70–99)

## 2013-11-15 SURGERY — COLONOSCOPY WITH ESOPHAGOGASTRODUODENOSCOPY (EGD)
Anesthesia: Moderate Sedation

## 2013-11-15 SURGERY — COLONOSCOPY WITH PROPOFOL
Anesthesia: Monitor Anesthesia Care | Site: Esophagus

## 2013-11-15 MED ORDER — LACTATED RINGERS IV SOLN
INTRAVENOUS | Status: DC
  Administered 2013-11-15: 08:00:00 via INTRAVENOUS

## 2013-11-15 MED ORDER — PROPOFOL 10 MG/ML IV BOLUS
INTRAVENOUS | Status: AC
  Filled 2013-11-15: qty 20

## 2013-11-15 MED ORDER — PROPOFOL 10 MG/ML IV BOLUS
INTRAVENOUS | Status: DC | PRN
  Administered 2013-11-15 (×2): 20 mg via INTRAVENOUS

## 2013-11-15 MED ORDER — ONDANSETRON HCL 4 MG/2ML IJ SOLN
4.0000 mg | Freq: Once | INTRAMUSCULAR | Status: AC
  Administered 2013-11-15: 4 mg via INTRAVENOUS

## 2013-11-15 MED ORDER — BUTAMBEN-TETRACAINE-BENZOCAINE 2-2-14 % EX AERO
INHALATION_SPRAY | CUTANEOUS | Status: DC | PRN
  Administered 2013-11-15: 2 via TOPICAL

## 2013-11-15 MED ORDER — GLYCOPYRROLATE 0.2 MG/ML IJ SOLN
INTRAMUSCULAR | Status: AC
  Filled 2013-11-15: qty 1

## 2013-11-15 MED ORDER — FENTANYL CITRATE 0.05 MG/ML IJ SOLN
INTRAMUSCULAR | Status: AC
  Filled 2013-11-15: qty 2

## 2013-11-15 MED ORDER — ONDANSETRON HCL 4 MG/2ML IJ SOLN
INTRAMUSCULAR | Status: AC
  Filled 2013-11-15: qty 2

## 2013-11-15 MED ORDER — OXYCODONE HCL 10 MG PO TABS: ORAL_TABLET | ORAL | Status: DC

## 2013-11-15 MED ORDER — MIDAZOLAM HCL 2 MG/2ML IJ SOLN
1.0000 mg | INTRAMUSCULAR | Status: DC | PRN
  Administered 2013-11-15: 2 mg via INTRAVENOUS

## 2013-11-15 MED ORDER — ONDANSETRON HCL 4 MG/2ML IJ SOLN: 4.0000 mg | Freq: Once | INTRAMUSCULAR | Status: DC | PRN

## 2013-11-15 MED ORDER — BUTAMBEN-TETRACAINE-BENZOCAINE 2-2-14 % EX AERO
INHALATION_SPRAY | CUTANEOUS | Status: AC
  Filled 2013-11-15: qty 56

## 2013-11-15 MED ORDER — PROPOFOL INFUSION 10 MG/ML OPTIME
INTRAVENOUS | Status: DC | PRN
  Administered 2013-11-15 (×2): via INTRAVENOUS
  Administered 2013-11-15: 100 ug/kg/min via INTRAVENOUS

## 2013-11-15 MED ORDER — SODIUM CHLORIDE 0.9 % IJ SOLN: PREFILLED_SYRINGE | INTRAMUSCULAR | Status: DC | PRN

## 2013-11-15 MED ORDER — MIDAZOLAM HCL 2 MG/2ML IJ SOLN
INTRAMUSCULAR | Status: AC
  Filled 2013-11-15: qty 2

## 2013-11-15 MED ORDER — LIDOCAINE HCL 1 % IJ SOLN
INTRAMUSCULAR | Status: DC | PRN
  Administered 2013-11-15: 20 mg via INTRADERMAL

## 2013-11-15 MED ORDER — GLYCOPYRROLATE 0.2 MG/ML IJ SOLN
0.2000 mg | Freq: Once | INTRAMUSCULAR | Status: AC
  Administered 2013-11-15: 0.2 mg via INTRAVENOUS

## 2013-11-15 MED ORDER — FENTANYL CITRATE 0.05 MG/ML IJ SOLN
25.0000 ug | INTRAMUSCULAR | Status: AC
  Administered 2013-11-15: 25 ug via INTRAVENOUS
  Filled 2013-11-15: qty 2

## 2013-11-15 MED ORDER — FENTANYL CITRATE 0.05 MG/ML IJ SOLN
25.0000 ug | INTRAMUSCULAR | Status: DC | PRN
  Administered 2013-11-15: 25 ug via INTRAVENOUS

## 2013-11-15 MED ORDER — WATER FOR IRRIGATION, STERILE IR SOLN
Status: DC | PRN
  Administered 2013-11-15: 1000 mL

## 2013-11-15 MED ORDER — STERILE WATER FOR IRRIGATION IR SOLN
Status: DC | PRN
  Administered 2013-11-15: 09:00:00

## 2013-11-15 MED ORDER — FENTANYL CITRATE 0.05 MG/ML IJ SOLN
INTRAMUSCULAR | Status: DC | PRN
  Administered 2013-11-15: 50 ug via INTRAVENOUS

## 2013-11-15 SURGICAL SUPPLY — 28 items
BLOCK BITE 60FR ADLT L/F BLUE (MISCELLANEOUS) ×2 IMPLANT
ELECT REM PT RETURN 9FT ADLT (ELECTROSURGICAL)
ELECTRODE REM PT RTRN 9FT ADLT (ELECTROSURGICAL) IMPLANT
FCP BXJMBJMB 240X2.8X (CUTTING FORCEPS)
FLOOR PAD 36X40 (MISCELLANEOUS) ×4
FORCEPS BIOP RAD 4 LRG CAP 4 (CUTTING FORCEPS) ×2 IMPLANT
FORCEPS BIOP RJ4 240 W/NDL (CUTTING FORCEPS)
FORCEPS BXJMBJMB 240X2.8X (CUTTING FORCEPS) IMPLANT
FORMALIN 10 PREFIL 20ML (MISCELLANEOUS) IMPLANT
INJECTOR/SNARE I SNARE (MISCELLANEOUS) IMPLANT
KIT CLEAN ENDO COMPLIANCE (KITS) ×4 IMPLANT
LUBRICANT JELLY 4.5OZ STERILE (MISCELLANEOUS) ×2 IMPLANT
MANIFOLD NEPTUNE II (INSTRUMENTS) ×4 IMPLANT
NDL SCLEROTHERAPY 25GX240 (NEEDLE) IMPLANT
NEEDLE SCLEROTHERAPY 25GX240 (NEEDLE) ×4 IMPLANT
PAD FLOOR 36X40 (MISCELLANEOUS) ×2 IMPLANT
PROBE APC STR FIRE (PROBE) IMPLANT
PROBE INJECTION GOLD (MISCELLANEOUS)
PROBE INJECTION GOLD 7FR (MISCELLANEOUS) IMPLANT
ROTH PLATINUM NET UNIVERSAL (MISCELLANEOUS) ×2 IMPLANT
SNARE ROTATE MED OVAL 20MM (MISCELLANEOUS) ×2 IMPLANT
SNARE SHORT THROW 13M SML OVAL (MISCELLANEOUS) ×4 IMPLANT
SYR 50ML LL SCALE MARK (SYRINGE) ×2 IMPLANT
SYR INFLATION 60ML (SYRINGE) IMPLANT
TRAP SPECIMEN MUCOUS 40CC (MISCELLANEOUS) ×2 IMPLANT
TUBING ENDO SMARTCAP PENTAX (MISCELLANEOUS) IMPLANT
TUBING IRRIGATION ENDOGATOR (MISCELLANEOUS) ×2 IMPLANT
WATER STERILE IRR 1000ML POUR (IV SOLUTION) ×2 IMPLANT

## 2013-11-15 NOTE — H&P (View-Only) (Signed)
 Subjective:    Patient ID: [name], male    DOB: 03/17/1961, 53 y.o.   MRN: [id number]  [name], Shannon G, PA-C   HPI STOPPED LACTULOSE DUE TO LOOSE STOOLS. BMs: AT LEAST 1-3X/DAY. R LOWER ABD PAIN BETTER. LOSING FLUID DUE TO WATER PILLS. STAYS COLD. SLEEPS A LOT. HAS CRAMPS IN HIS FEET. HOT STARTED WITH PILL AND SHOT. STOPPED RX DUE TO DEPRESSION. LAST APPT WITH DR. ZACKS IN [place]. HAS MEDICAID COMING NEXT MONTH. HAS MEDICARE.  LAST VISIT: 1 YEAR AGO. LAST BRBPR FEB 2015: WHOLE LOT, SEVERAL TIMES. RARE VOMITING: 1-2X/MO. RARE DIARRHEA WITH CERTAIN FOODS. PT DENIES FEVER, CHILLS, nausea, vomiting, melena, constipation, problems swallowing, OR problems with sedation. RARE: heartburn or indigestion: 2-3X/WEEK,..  Past Medical History  Diagnosis Date  . Hypertension   . Diabetes mellitus   . GERD (gastroesophageal reflux disease)     DEC 2010 EGD/Bx REACTIVE GASTROPATHY, NO VARICES  . Hemorrhoids, internal   . BMI 40.0-44.9, adult OCT 2010 269 LBS    APR 2012 279 LBS AUG 2014 185 LBS  . 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 IV drug abuse REMOTE  . Hepatitis 2010 HEP C    AST 509 ALT 267 ALK PHOS 165 ALB 3.8 NEG IGM HAV/HBSAg  . Gallstone AUG 2012 1 CM  . GERD 10/25/2009  . COPD (chronic obstructive pulmonary disease)   . Hepatitis C   . Pancytopenia 2013  . Splenomegaly 2013  . Other pancytopenia 02/18/2013  . Iron (Fe) deficiency anemia 02/18/2013  . Splenomegaly 02/18/2013    Past Surgical History  Procedure Laterality Date  . Sigmoidoscopy      2001 DR. FLEISCHMAN INTERNAL HERMORRHOIDS  . Upper gastrointestinal endoscopy  DEC 2010    BENIGN POLYPS, GASTRITIS, ?phg  . Knee surgery  RIGHT  . Hemorrhoid surgery    . Esophageal biopsy  09/08/2011    Dr. Rhesa Forsberg:Moderate gastritis/Polyps, multiple in the body of the stomach   No Known Allergies  Current Outpatient Prescriptions  Medication Sig Dispense Refill  . albuterol (PROVENTIL HFA;VENTOLIN  HFA) 108 (90 BASE) MCG/ACT inhaler Inhale 2 puffs into the lungs every 6 (six) hours as needed. Shortness of breath      . esomeprazole (NEXIUM) 40 MG capsule Take 40 mg by mouth 2 (two) times daily before a meal.      . furosemide (LASIX) 20 MG tablet Take 2 tablets (40 mg total) by mouth daily.    . glipiZIDE (GLUCOTROL) 10 MG tablet Take 10 mg by mouth 2 (two) times daily before a meal.      . hydrOXYzine (ATARAX/VISTARIL) 10 MG tablet Take 1 tablet (10 mg total) by mouth every 4 (four) hours as needed for itching.    . insulin glargine (LANTUS) 100 UNIT/ML injection Inject 10 Units into the skin at bedtime.    . spironolactone (ALDACTONE) 50 MG tablet Take 2 tablets (100 mg total) by mouth daily.        Review of Systems     Objective:   Physical Exam  Vitals reviewed. Constitutional: He is oriented to person, place, and time. He appears well-nourished. No distress.  HENT:  Head: Normocephalic and atraumatic.  Mouth/Throat: Oropharynx is clear and moist. No oropharyngeal exudate.  Eyes: Pupils are equal, round, and reactive to light. No scleral icterus.  Neck: Normal range of motion. Neck supple.  Cardiovascular: Normal rate and regular rhythm.   Murmur heard. Pulmonary/Chest: Effort normal and breath   sounds normal. No respiratory distress.  Abdominal: Soft. Bowel sounds are normal. He exhibits no distension. There is tenderness. There is no rebound and no guarding.  MILD TTP RLQ  Musculoskeletal: He exhibits edema (TRACE/1+ BIL LE).  Lymphadenopathy:    He has no cervical adenopathy.  Neurological: He is alert and oriented to person, place, and time.  NO FOCAL DEFICITS   Psychiatric:  FLAT AFFECT, NL MOOD           Assessment & Plan:   

## 2013-11-15 NOTE — Anesthesia Preprocedure Evaluation (Signed)
Anesthesia Evaluation  Patient identified by MRN, date of birth, ID band Patient awake    Reviewed: Allergy & Precautions, H&P , NPO status , Patient's Chart, lab work & pertinent test results  History of Anesthesia Complications (+) history of anesthetic complications (slow to wake up)  Airway Mallampati: II      Dental  (+) Edentulous Upper   Pulmonary shortness of breath and with exertion, COPD COPD inhaler,  + rhonchi         Cardiovascular hypertension, Pt. on medications Rhythm:Regular Rate:Normal     Neuro/Psych    GI/Hepatic GERD-  Medicated and Controlled,(+) Cirrhosis -  Esophageal Varices and ascites  substance abuse (inactive)   , Hepatitis -, C  Endo/Other  diabetes, Well Controlled, Type 2, Insulin Dependent  Renal/GU      Musculoskeletal   Abdominal   Peds  Hematology  (+) Blood dyscrasia, anemia ,   Anesthesia Other Findings   Reproductive/Obstetrics                           Anesthesia Physical Anesthesia Plan  ASA: IV  Anesthesia Plan: MAC   Post-op Pain Management:    Induction: Intravenous  Airway Management Planned: Simple Face Mask  Additional Equipment:   Intra-op Plan:   Post-operative Plan:   Informed Consent: I have reviewed the patients History and Physical, chart, labs and discussed the procedure including the risks, benefits and alternatives for the proposed anesthesia with the patient or authorized representative who has indicated his/her understanding and acceptance.     Plan Discussed with:   Anesthesia Plan Comments:         Anesthesia Quick Evaluation

## 2013-11-15 NOTE — Interval H&P Note (Signed)
History and Physical Interval Note:  11/15/2013 8:00 AM  [name]  has presented today for surgery, with the diagnosis of RECTAL BLEEDING AND VARICEAL SCREENING  The various methods of treatment have been discussed with the patient and family. After consideration of risks, benefits and other options for treatment, the patient has consented to  Procedure(s) with comments: COLONOSCOPY WITH PROPOFOL (N/A) - 9:45-Leigh Ann notified pt to arrive at 7:30am for 9:00 procedure ESOPHAGOGASTRODUODENOSCOPY (EGD) WITH PROPOFOL (N/A) as a surgical intervention .  The patient's history has been reviewed, patient examined, no change in status, stable for surgery.  I have reviewed the patient's chart and labs.  Questions were answered to the patient's satisfaction.     [employer]

## 2013-11-15 NOTE — Discharge Instructions (Signed)
Colonoscopy  A colonoscopy is an exam to look at the entire large intestine (colon). This exam can help find problems such as tumors, polyps, inflammation, and areas of bleeding. The exam takes about 1 hour.   LET YOUR HEALTH CARE PROVIDER KNOW ABOUT:   · Any allergies you have.  · All medicines you are taking, including vitamins, herbs, eye drops, creams, and over-the-counter medicines.  · Previous problems you or members of your family have had with the use of anesthetics.  · Any blood disorders you have.  · Previous surgeries you have had.  · Medical conditions you have.  RISKS AND COMPLICATIONS   Generally, this is a safe procedure. However, as with any procedure, complications can occur. Possible complications include:  · Bleeding.  · Tearing or rupture of the colon wall.  · Reaction to medicines given during the exam.  · Infection (rare).  BEFORE THE PROCEDURE   · Ask your health care provider about changing or stopping your regular medicines.  · You may be prescribed an oral bowel prep. This involves drinking a large amount of medicated liquid, starting the day before your procedure. The liquid will cause you to have multiple loose stools until your stool is almost clear or light green. This cleans out your colon in preparation for the procedure.  · Do not eat or drink anything else once you have started the bowel prep, unless your health care provider tells you it is safe to do so.  · Arrange for someone to drive you home after the procedure.  PROCEDURE   · You will be given medicine to help you relax (sedative).  · You will lie on your side with your knees bent.  · A long, flexible tube with a light and camera on the end (colonoscope) will be inserted through the rectum and into the colon. The camera sends video back to a computer screen as it moves through the colon. The colonoscope also releases carbon dioxide gas to inflate the colon. This helps your health care provider see the area better.  · During  the exam, your health care provider may take a small tissue sample (biopsy) to be examined under a microscope if any abnormalities are found.  · The exam is finished when the entire colon has been viewed.  AFTER THE PROCEDURE   · Do not drive for 24 hours after the exam.  · You may have a small amount of blood in your stool.  · You may pass moderate amounts of gas and have mild abdominal cramping or bloating. This is caused by the gas used to inflate your colon during the exam.  · Ask when your test results will be ready and how you will get your results. Make sure you get your test results.  Document Released: 07/25/2000 Document Revised: 05/18/2013 Document Reviewed: 04/04/2013  ExitCare® Patient Information ©2014 [employer].  Esophagogastroduodenoscopy  Esophagogastroduodenoscopy (EGD) is a procedure to examine the lining of the esophagus, stomach, and first part of the small intestine (duodenum). A long, flexible, lighted tube with a camera attached (endoscope) is inserted down the throat to view these organs. This procedure is done to detect problems or abnormalities, such as inflammation, bleeding, ulcers, or growths, in order to treat them. The procedure lasts about 5 20 minutes. It is usually an outpatient procedure, but it may need to be performed in emergency cases in the hospital.  LET YOUR CAREGIVER KNOW ABOUT:   · Allergies to food or medicine.  ·   All medicines you are taking, including vitamins, herbs, eyedrops, and over-the-counter medicines and creams.  · Use of steroids (by mouth or creams).  · Previous problems you or members of your family have had with the use of anesthetics.  · Any blood disorders you have.  · Previous surgeries you have had.  · Other health problems you have.  · Possibility of pregnancy, if this applies.  RISKS AND COMPLICATIONS   Generally, EGD is a safe procedure. However, as with any procedure, complications can occur. Possible complications  include:  · Infection.  · Bleeding.  · Tearing (perforation) of the esophagus, stomach, or duodenum.  · Difficulty breathing or not being able to breath.  · Excessive sweating.  · Spasms of the larynx.  · Slowed heartbeat.  · Low blood pressure.  BEFORE THE PROCEDURE  · Do not eat or drink anything for 6 8 hours before the procedure or as directed by your caregiver.  · Ask your caregiver about changing or stopping your regular medicines.  · If you wear dentures, be prepared to remove them before the procedure.  · Arrange for someone to drive you home after the procedure.  PROCEDURE   · A vein will be accessed to give medicines and fluids. A medicine to relax you (sedative) and a pain reliever will be given through that access into the vein.  · A numbing medicine (local anesthetic) may be sprayed on your throat for comfort and to stop you from gagging or coughing.  · A mouth guard may be placed in your mouth to protect your teeth and to keep you from biting on the endoscope.  · You will be asked to lie on your left side.  · The endoscope is inserted down your throat and into the esophagus, stomach, and duodenum.  · Air is put through the endoscope to allow your caregiver to view the lining of your esophagus clearly.  · The esophagus, stomach, and duodenum is then examined. During the exam, your caregiver may:  · Remove tissue to be examined under a microscope (biopsy) for inflammation, infection, or other medical problems.  · Remove growths.  · Remove objects (foreign bodies) that are stuck.  · Treat any bleeding with medicines or other devices that stop tissues from bleeding (hot cauters, clipping devices).  · Widen (dilate) or stretch narrowed areas of the esophagus and stomach.  · The endoscope will then be withdrawn.  AFTER THE PROCEDURE  · You will be taken to a recovery area to be monitored. You will be able to go home once you are stable and alert.  · Do not eat or drink anything until the local anesthetic and  numbing medicines have worn off. You may choke.  · It is normal to feel bloated, have pain with swallowing, or have a sore throat for a short time. This will wear off.  · Your caregiver should be able to discuss his or her findings with you. It will take longer to discuss the test results if any biopsies were taken.  Document Released: 11/28/2004 Document Revised: 07/14/2012 Document Reviewed: 06/30/2012  ExitCare® Patient Information ©2014 [employer].

## 2013-11-15 NOTE — Anesthesia Postprocedure Evaluation (Addendum)
  Anesthesia Post-op Note  Patient: [name]  Procedure(s) Performed: Procedure(s) with comments: COLONOSCOPY WITH PROPOFOL (N/A) - 0846 in cecum; withdrawal time 17 minutes ESOPHAGOGASTRODUODENOSCOPY (EGD) WITH PROPOFOL (N/A)  Patient Location: PACU  Anesthesia Type:MAC  Level of Consciousness: awake, alert  and oriented  Airway and Oxygen Therapy: Patient Spontanous Breathing and Patient connected to face mask oxygen  Post-op Pain: mild  Post-op Assessment: Post-op Vital signs reviewed, Patient's Cardiovascular Status Stable, Respiratory Function Stable, Patent Airway and No signs of Nausea or vomiting  Post-op Vital Signs: Reviewed and stable  Complications: No apparent anesthesia complications

## 2013-11-15 NOTE — Op Note (Addendum)
[name], [id number]   COLONOSCOPY PROCEDURE REPORT  PATIENT: [name], [name].  MR#: [id number] BIRTHDATE: 01/11/1961 , 52  yrs. old GENDER: Male ENDOSCOPIST: Barney Drain, MD REFERRED IR:JJOACZY [name], PA-C PROCEDURE DATE:  11/15/2013 PROCEDURE:   Colonoscopy, diagnostic INDICATIONS:Rectal Bleeding. MEDICATIONS: MAC sedation, administered by CRNA  DESCRIPTION OF PROCEDURE:    Physical exam was performed.  Informed consent was obtained from the patient after explaining the benefits, risks, and alternatives to procedure.  The patient was connected to monitor and placed in left lateral position. Continuous oxygen was provided by nasal cannula and IV medicine administered through an indwelling cannula.  After administration of sedation and rectal exam, the patients rectum was intubated and the     colonoscope was advanced under direct visualization to the cecum.  The scope was removed slowly by carefully examining the color, texture, anatomy, and integrity mucosa on the way out.  The patient was recovered in endoscopy and discharged home in satisfactory condition.     COLON FINDINGS: Small angiodysplastic lesion was found at the cecum(1) and in the transverse colon(2).  , The colon was otherwise normal.  There was no diverticulosis, inflammation, polyps or cancers unless previously stated.  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  PREP QUALITY: good. CECAL W/D TIME: 18 minutes     COMPLICATIONS: None  ENDOSCOPIC IMPRESSION: Rectal varices SMALL AVMs  RECOMMENDATIONS: PT IS NOT A CANDIDATE FOR ENDOSCOPIC/CRH BANDING DUE TO RECTAL VARICES/PORTAL HTN.  ANUSOL SUPP PRN OR SURGICAL REFERRAL TO DISCUSS BENEFITS V. RISKS OF BANDING. NEXT TCS IN 10 YEARS HIGH FIBER/LOW FAT DIET      _______________________________ Lorrin MaisBarney Drain, MD 11/15/2013 9:41 PM Revised:  11/15/2013 9:41 PM

## 2013-11-15 NOTE — Addendum Note (Signed)
Addendum created 11/15/13 1057 by Ollen Bowl, CRNA   Modules edited: Anesthesia Flowsheet

## 2013-11-15 NOTE — Transfer of Care (Signed)
Immediate Anesthesia Transfer of Care Note  Patient: [name]  Procedure(s) Performed: Procedure(s) with comments: COLONOSCOPY WITH PROPOFOL (N/A) - 0846 in cecum; withdrawal time 17 minutes ESOPHAGOGASTRODUODENOSCOPY (EGD) WITH PROPOFOL (N/A)  Patient Location: PACU  Anesthesia Type:MAC  Level of Consciousness: awake  Airway & Oxygen Therapy: Patient Spontanous Breathing  Post-op Assessment: Report given to PACU RN  Post vital signs: Reviewed and stable  Complications: No apparent anesthesia complications

## 2013-11-15 NOTE — Op Note (Signed)
[place], [address]   ENDOSCOPY PROCEDURE REPORT  PATIENT: [name], [name].  MR#: [id number] BIRTHDATE: 1961-02-05 , 52  yrs. old GENDER: Male  ENDOSCOPIST: Barney Drain, MD REFERRED [place]:GURKYHC [name], PA-C DR. ZACKS  PROCEDURE DATE: 11/15/2013 PROCEDURE:   EGD w/ biopsy and EGD w/ snare technique  INDICATIONS:Screening for varices. MEDICATIONS: MAC sedation, administered by CRNA TOPICAL ANESTHETIC:   Cetacaine Spray  DESCRIPTION OF PROCEDURE:     Physical exam was performed.  Informed consent was obtained from the patient after explaining the benefits, risks, and alternatives to the procedure.  The patient was connected to the monitor and placed in the left lateral position.  Continuous oxygen was provided by nasal cannula and IV medicine administered through an indwelling cannula.  After administration of sedation, the patients esophagus was intubated and the     endoscope was advanced under direct visualization to the second portion of the duodenum.  The scope was removed slowly by carefully examining the color, texture, anatomy, and integrity of the mucosa on the way out.  The patient was recovered in endoscopy and discharged home in satisfactory condition.   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   STOMACH: A polypoid shaped sessile polyp measuring 1.2 cm mm in size was found at the pylorus.  A polypectomy was performed with snare cautery(25W).  The resection was complete and the polyp tissue was completely retrieved.   NODULAR GASTRITIS.  COLD FORCEPS BIOPSIES OBTAINED.   DUODENUM: The duodenal mucosa showed no abnormalities in the bulb and second portion of the duodenum. COMPLICATIONS:   None  ENDOSCOPIC IMPRESSION: 1.   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   LARGE POLYP REMOVED at the pylorus 3.   MODERATE NODULAR GASTRITIS.  RECOMMENDATIONS: AWAIY BIOPSY SEE DR.  ZACKS FOR HCV Rx NEXT EGD IN 1-2  YEARS   REPEAT EXAM:   _______________________________ Lorrin MaisBarney Drain, MD 11/15/2013 4:02 PM

## 2013-11-16 ENCOUNTER — Encounter (HOSPITAL_COMMUNITY): Payer: Self-pay | Admitting: Gastroenterology

## 2013-11-16 LAB — GLUCOSE, CAPILLARY: Glucose-Capillary: 93 mg/dL (ref 70–99)

## 2013-11-17 ENCOUNTER — Other Ambulatory Visit: Payer: Self-pay

## 2013-11-17 MED ORDER — ESOMEPRAZOLE MAGNESIUM 40 MG PO CPDR: 40.0000 mg | DELAYED_RELEASE_CAPSULE | Freq: Two times a day (BID) | ORAL | Status: DC

## 2013-12-01 ENCOUNTER — Other Ambulatory Visit (HOSPITAL_COMMUNITY): Payer: Self-pay | Admitting: Hematology and Oncology

## 2013-12-01 ENCOUNTER — Telehealth (HOSPITAL_COMMUNITY): Payer: Self-pay | Admitting: *Deleted

## 2013-12-01 MED ORDER — OXYCODONE HCL 10 MG PO TABS: ORAL_TABLET | ORAL | Status: DC

## 2013-12-01 NOTE — Progress Notes (Signed)
I called and LMOM at home for a return call. Mobile, no VM.

## 2013-12-01 NOTE — Progress Notes (Signed)
Pt returned call and was informed.  

## 2013-12-01 NOTE — Progress Notes (Addendum)
PLEASE CALL PT. HIS LABS TESTS ARE ESSENTIALLY THE SAME. HIS STOMACH BIOPSIES SHOW BENIGN STOMACH POLYPS. HE SHOULD COMPLETE HIS LIVER TRANSPLANT EVALUATION. REPEAT EGD IN 2 YEARS. NEXT TCS IN 10 YEARS.

## 2013-12-01 NOTE — Progress Notes (Signed)
Pt also said they received a call from [facility] in reference to the consult for liver transplant. They would not talk to his wife, [name] and he was not at home. She did not get the phone number and he is afraid they won't call back for awhile. He asked if we could call and ask them to please call him. I told him I will send [name] a message.

## 2013-12-01 NOTE — Progress Notes (Signed)
I called and gave [name] the phone number for Dr. Virgina Jock in [place]

## 2013-12-05 NOTE — Progress Notes (Signed)
[name] is scheduled with Dr. Virgina Jock in [place] on 12/15/13 at 1:45

## 2013-12-23 ENCOUNTER — Telehealth (HOSPITAL_COMMUNITY): Payer: Self-pay | Admitting: Hematology and Oncology

## 2013-12-23 ENCOUNTER — Other Ambulatory Visit (HOSPITAL_COMMUNITY): Payer: Self-pay | Admitting: Hematology and Oncology

## 2013-12-23 MED ORDER — OXYCODONE HCL 10 MG PO TABS: ORAL_TABLET | ORAL | Status: DC

## 2013-12-27 ENCOUNTER — Encounter (HOSPITAL_COMMUNITY): Payer: Self-pay

## 2013-12-27 ENCOUNTER — Encounter (HOSPITAL_COMMUNITY): Payer: Medicare Other | Attending: Hematology and Oncology

## 2013-12-27 ENCOUNTER — Encounter (HOSPITAL_COMMUNITY): Payer: Medicare Other

## 2013-12-27 VITALS — BP 145/68 | HR 72 | Temp 97.8°F | Resp 20 | Wt 288.3 lb

## 2013-12-27 DIAGNOSIS — K5520 Angiodysplasia of colon without hemorrhage: Secondary | ICD-10-CM | POA: Insufficient documentation

## 2013-12-27 DIAGNOSIS — D731 Hypersplenism: Secondary | ICD-10-CM

## 2013-12-27 DIAGNOSIS — B1920 Unspecified viral hepatitis C without hepatic coma: Secondary | ICD-10-CM

## 2013-12-27 DIAGNOSIS — D509 Iron deficiency anemia, unspecified: Secondary | ICD-10-CM | POA: Insufficient documentation

## 2013-12-27 DIAGNOSIS — K7460 Unspecified cirrhosis of liver: Secondary | ICD-10-CM

## 2013-12-27 DIAGNOSIS — D61818 Other pancytopenia: Secondary | ICD-10-CM

## 2013-12-27 DIAGNOSIS — R109 Unspecified abdominal pain: Secondary | ICD-10-CM

## 2013-12-27 LAB — CBC WITH DIFFERENTIAL/PLATELET
Basophils Absolute: 0 10*3/uL (ref 0.0–0.1)
Basophils Relative: 0 % (ref 0–1)
Eosinophils Absolute: 0.1 10*3/uL (ref 0.0–0.7)
Eosinophils Relative: 2 % (ref 0–5)
HEMATOCRIT: 30.2 % — AB (ref 39.0–52.0)
Hemoglobin: 10.3 g/dL — ABNORMAL LOW (ref 13.0–17.0)
LYMPHS PCT: 21 % (ref 12–46)
Lymphs Abs: 0.6 10*3/uL — ABNORMAL LOW (ref 0.7–4.0)
MCH: 31.8 pg (ref 26.0–34.0)
MCHC: 34.1 g/dL (ref 30.0–36.0)
MCV: 93.2 fL (ref 78.0–100.0)
Monocytes Absolute: 0.5 10*3/uL (ref 0.1–1.0)
Monocytes Relative: 17 % — ABNORMAL HIGH (ref 3–12)
Neutro Abs: 1.7 10*3/uL (ref 1.7–7.7)
Neutrophils Relative %: 59 % (ref 43–77)
Platelets: 63 10*3/uL — ABNORMAL LOW (ref 150–400)
RBC: 3.24 MIL/uL — ABNORMAL LOW (ref 4.22–5.81)
RDW: 15 % (ref 11.5–15.5)
WBC: 2.8 10*3/uL — AB (ref 4.0–10.5)

## 2013-12-27 LAB — COMPREHENSIVE METABOLIC PANEL
ALT: 116 U/L — ABNORMAL HIGH (ref 0–53)
AST: 241 U/L — AB (ref 0–37)
Albumin: 2.6 g/dL — ABNORMAL LOW (ref 3.5–5.2)
Alkaline Phosphatase: 118 U/L — ABNORMAL HIGH (ref 39–117)
BUN: 16 mg/dL (ref 6–23)
CO2: 22 meq/L (ref 19–32)
CREATININE: 0.71 mg/dL (ref 0.50–1.35)
Calcium: 8 mg/dL — ABNORMAL LOW (ref 8.4–10.5)
Chloride: 108 mEq/L (ref 96–112)
Glucose, Bld: 193 mg/dL — ABNORMAL HIGH (ref 70–99)
Potassium: 3.9 mEq/L (ref 3.7–5.3)
Sodium: 139 mEq/L (ref 137–147)
Total Bilirubin: 2.5 mg/dL — ABNORMAL HIGH (ref 0.3–1.2)
Total Protein: 6.5 g/dL (ref 6.0–8.3)

## 2013-12-27 LAB — FERRITIN: Ferritin: 111 ng/mL (ref 22–322)

## 2013-12-27 LAB — AMMONIA: AMMONIA: 102 umol/L — AB (ref 11–60)

## 2013-12-27 MED ORDER — FUROSEMIDE 20 MG PO TABS: 40.0000 mg | ORAL_TABLET | Freq: Every day | ORAL | Status: DC

## 2013-12-27 MED ORDER — SPIRONOLACTONE 50 MG PO TABS: 100.0000 mg | ORAL_TABLET | Freq: Every day | ORAL | Status: DC

## 2013-12-27 MED ORDER — HYDROXYZINE HCL 10 MG PO TABS: 10.0000 mg | ORAL_TABLET | ORAL | Status: DC | PRN

## 2013-12-27 NOTE — Patient Instructions (Signed)
[facility] Discharge Instructions  RECOMMENDATIONS MADE BY THE CONSULTANT AND ANY TEST RESULTS WILL BE SENT TO YOUR REFERRING PHYSICIAN.  EXAM FINDINGS BY THE PHYSICIAN TODAY AND SIGNS OR SYMPTOMS TO REPORT TO CLINIC OR PRIMARY PHYSICIAN: Exam and findings as discussed by Dr. Barnet Glasgow.  Will let you know if labs show any problems.  Report increased fatigue or shortness of breath.  MEDICATIONS PRESCRIBED:  Refills for Furosemide, hydroxyzine & spironolactone given  INSTRUCTIONS/FOLLOW-UP: Follow-up in 2 months with labs and office visit.  Thank you for choosing [facility] to provide your oncology and hematology care.  To afford each patient quality time with our providers, please arrive at least 15 minutes before your scheduled appointment time.  With your help, our goal is to use those 15 minutes to complete the necessary work-up to ensure our physicians have the information they need to help with your evaluation and healthcare recommendations.    Effective January 1st, 2014, we ask that you re-schedule your appointment with our physicians should you arrive 10 or more minutes late for your appointment.  We strive to give you quality time with our providers, and arriving late affects you and other patients whose appointments are after yours.    Again, thank you for choosing [facility].  Our hope is that these requests will decrease the amount of time that you wait before being seen by our physicians.       _____________________________________________________________  Should you have questions after your visit to [facility], please contact our office at [phone] between the hours of 8:30 a.m. and 5:00 p.m.  Voicemails left after 4:30 p.m. will not be returned until the following business day.  For prescription refill requests, have your pharmacy contact our office with your prescription refill request.

## 2013-12-27 NOTE — Progress Notes (Signed)
[place]  OFFICE PROGRESS NOTE  Jacqualine Mau, PA-C [facility] [address] [place] [address]  DIAGNOSIS: Angiodysplasia of colon - Plan: CBC with Differential, furosemide (LASIX) 20 MG tablet, spironolactone (ALDACTONE) 50 MG tablet, hydrOXYzine (ATARAX/VISTARIL) 10 MG tablet, CBC with Differential, Soluble transferrin receptor, Ferritin, Soluble transferrin receptor, Soluble transferrin receptor  Iron (Fe) deficiency anemia - Plan: CBC with Differential, furosemide (LASIX) 20 MG tablet, spironolactone (ALDACTONE) 50 MG tablet, hydrOXYzine (ATARAX/VISTARIL) 10 MG tablet, CBC with Differential, Soluble transferrin receptor, Ferritin, Soluble transferrin receptor, Soluble transferrin receptor, CANCELED: Miscellaneous test  Cirrhosis of liver without mention of alcohol - Plan: CBC with Differential, furosemide (LASIX) 20 MG tablet, spironolactone (ALDACTONE) 50 MG tablet, hydrOXYzine (ATARAX/VISTARIL) 10 MG tablet, CBC with Differential, Soluble transferrin receptor, Ferritin, Soluble transferrin receptor, Soluble transferrin receptor, CANCELED: Miscellaneous test  Hypersplenism - Plan: CBC with Differential, furosemide (LASIX) 20 MG tablet, spironolactone (ALDACTONE) 50 MG tablet, hydrOXYzine (ATARAX/VISTARIL) 10 MG tablet, CBC with Differential, Soluble transferrin receptor, Ferritin, Soluble transferrin receptor, Soluble transferrin receptor, CANCELED: Miscellaneous test  Chief Complaint  Patient presents with  . Cirrhosis of the liver  . Iron deficiency    IV Feraheme 10/10/2013    CURRENT THERAPY: IV Feraheme 10/10/2013  INTERVAL HISTORY: [name] 53 y.o. male returns for followup of pancytopenia secondary to cirrhosis of the liver and iron deficiency, status post IV Feraheme on 10/10/2013.  He continues to have lower 70 swelling and abdominal swelling. He has an appointment in [place], [place] for hepatology consultation for possible transplant. He continues on oxycodone for pain associated with gallstones in situ. He denies any fever, night sweats, but still has pruritus with no sore throat, melena, hematochezia, hematuria, epistaxis, or mop this is. He does bruise easily.  MEDICAL HISTORY: Past Medical History  Diagnosis Date  . Hypertension   . Diabetes mellitus   . GERD (gastroesophageal reflux disease)     DEC 2010 EGD/Bx REACTIVE GASTROPATHY, NO VARICES  . Hemorrhoids, internal   . BMI 40.0-44.9, adult OCT 2010 269 LBS    APR 2012 279 LBS AUG 2014 185 LBS  . 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 IV drug abuse REMOTE  . Hepatitis 2010 HEP C    AST 509 ALT 267 ALK PHOS 165 ALB 3.8 NEG IGM HAV/HBSAg  . Gallstone AUG 2012 1 CM  . GERD 10/25/2009  . COPD (chronic obstructive pulmonary disease)   . Hepatitis C   . Pancytopenia 2013  . Splenomegaly 2013  . Other pancytopenia 02/18/2013  . Iron (Fe) deficiency anemia 02/18/2013  . Splenomegaly 02/18/2013  . Neuropathy     INTERIM HISTORY: has HEPATITIS C, CHRONIC; DM; ALCOHOL ABUSE; GERD; CIRRHOSIS; UNSPECIFIED DISEASE OF PANCREAS; FATIGUE; LIVER FUNCTION TESTS, ABNORMAL, HX OF; ABDOMINAL PAIN, CHRONIC; Gallstone; Colon cancer screening; Other pancytopenia; Iron (Fe) deficiency anemia; Splenomegaly; Right upper quadrant abdominal pain; Biliary colic; Cholecystitis, acute with cholelithiasis; Dyspnea; Hematochezia; and Angiodysplasia of colon on his problem list.    ALLERGIES:  has No Known Allergies.  MEDICATIONS: has a current medication list which includes the following prescription(s): albuterol, esomeprazole, glipizide, insulin glargine, oxycodone hcl, furosemide, hydroxyzine, and spironolactone.  SURGICAL HISTORY:  Past Surgical History  Procedure Laterality Date  . Sigmoidoscopy      2001 DR. FLEISCHMAN INTERNAL HERMORRHOIDS  . Upper gastrointestinal endoscopy  DEC 2010    BENIGN POLYPS,  GASTRITIS, ?  phg  . Knee surgery  RIGHT  . Hemorrhoid surgery    . Esophageal biopsy  09/08/2011    [name]:Moderate gastritis/Polyps, multiple in the body of the stomach  . Colonoscopy with propofol N/A 11/15/2013    Procedure: COLONOSCOPY WITH PROPOFOL;  Surgeon: Danie Binder, MD;  Location: [facility];  Service: Endoscopy;  Laterality: N/A;  0846 in cecum; withdrawal time 17 minutes  . Esophagogastroduodenoscopy (egd) with propofol N/A 11/15/2013    Procedure: ESOPHAGOGASTRODUODENOSCOPY (EGD) WITH PROPOFOL;  Surgeon: Danie Binder, MD;  Location: [facility];  Service: Endoscopy;  Laterality: N/A;    FAMILY HISTORY: family history includes Cancer in his father. There is no history of Colon cancer, Anesthesia problems, Hypotension, Malignant hyperthermia, or Pseudochol deficiency.  SOCIAL HISTORY:  reports that he has never smoked. He has never used smokeless tobacco. He reports that he uses illicit drugs (Marijuana and Cocaine). He reports that he does not drink alcohol.  REVIEW OF SYSTEMS:  Other than that discussed above is noncontributory.  PHYSICAL EXAMINATION: ECOG PERFORMANCE STATUS: 1 - Symptomatic but completely ambulatory  Blood pressure 145/68, pulse 72, temperature 97.8 F (36.6 C), resp. rate 20, weight 288 lb 4.8 oz (130.772 kg), SpO2 99.00%.  GENERAL:alert, no distress and comfortable SKIN: skin color, texture, turgor are normal, no rashes or significant lesions EYES: PERLA; Conjunctiva are pink and non-injected, sclera minimally icteric SINUSES: No redness or tenderness over maxillary or ethmoid sinuses OROPHARYNX:no exudate, no erythema on lips, buccal mucosa, or tongue. NECK: supple, thyroid normal size, non-tender, without nodularity. No masses CHEST: Increased AP diameter with bilateral gynecomastia and spider angiomata. LYMPH:  no palpable lymphadenopathy in the cervical, axillary or inguinal LUNGS: clear to auscultation and percussion with normal breathing effort HEART:  regular rate & rhythm and no murmurs. ABDOMEN:abdomen soft, non-tender and normal bowel sounds. Distended with a positive fluid wave and shifting dullness. MUSCULOSKELETAL:no cyanosis of digits and no clubbing. Range of motion normal. +2 lower extremity edema with negative Homans sign. NEURO: alert & oriented x 3 with fluent speech, no focal motor/sensory deficits. Evidence of asterixis.   LABORATORY DATA: Appointment on 12/27/2013  Component Date Value Ref Range Status  . WBC 12/27/2013 2.8* 4.0 - 10.5 K/uL Final  . RBC 12/27/2013 3.24* 4.22 - 5.81 MIL/uL Final  . Hemoglobin 12/27/2013 10.3* 13.0 - 17.0 g/dL Final  . HCT 12/27/2013 30.2* 39.0 - 52.0 % Final  . MCV 12/27/2013 93.2  78.0 - 100.0 fL Final  . MCH 12/27/2013 31.8  26.0 - 34.0 pg Final  . MCHC 12/27/2013 34.1  30.0 - 36.0 g/dL Final  . RDW 12/27/2013 15.0  11.5 - 15.5 % Final  . Platelets 12/27/2013 63* 150 - 400 K/uL Final   Comment: SPECIMEN CHECKED FOR CLOTS                          CONSISTENT WITH PREVIOUS RESULT  . Neutrophils Relative % 12/27/2013 59  43 - 77 % Final  . Neutro Abs 12/27/2013 1.7  1.7 - 7.7 K/uL Final  . Lymphocytes Relative 12/27/2013 21  12 - 46 % Final  . Lymphs Abs 12/27/2013 0.6* 0.7 - 4.0 K/uL Final  . Monocytes Relative 12/27/2013 17* 3 - 12 % Final  . Monocytes Absolute 12/27/2013 0.5  0.1 - 1.0 K/uL Final  . Eosinophils Relative 12/27/2013 2  0 - 5 % Final  . Eosinophils Absolute 12/27/2013 0.1  0.0 - 0.7 K/uL Final  .  Basophils Relative 12/27/2013 0  0 - 1 % Final  . Basophils Absolute 12/27/2013 0.0  0.0 - 0.1 K/uL Final  . Sodium 12/27/2013 139  137 - 147 mEq/L Final  . Potassium 12/27/2013 3.9  3.7 - 5.3 mEq/L Final  . Chloride 12/27/2013 108  96 - 112 mEq/L Final  . CO2 12/27/2013 22  19 - 32 mEq/L Final  . Glucose, Bld 12/27/2013 193* 70 - 99 mg/dL Final  . BUN 12/27/2013 16  6 - 23 mg/dL Final  . Creatinine, Ser 12/27/2013 0.71  0.50 - 1.35 mg/dL Final  . Calcium 12/27/2013 8.0*  8.4 - 10.5 mg/dL Final  . Total Protein 12/27/2013 6.5  6.0 - 8.3 g/dL Final  . Albumin 12/27/2013 2.6* 3.5 - 5.2 g/dL Final  . AST 12/27/2013 241* 0 - 37 U/L Final  . ALT 12/27/2013 116* 0 - 53 U/L Final  . Alkaline Phosphatase 12/27/2013 118* 39 - 117 U/L Final  . Total Bilirubin 12/27/2013 2.5* 0.3 - 1.2 mg/dL Final  . GFR calc non Af Amer 12/27/2013 >90  >90 mL/min Final  . GFR calc Af Amer 12/27/2013 >90  >90 mL/min Final   Comment: (NOTE)                          The eGFR has been calculated using the CKD EPI equation.                          This calculation has not been validated in all clinical situations.                          eGFR's persistently <90 mL/min signify possible Chronic Kidney                          Disease.  . Ammonia 12/27/2013 102* 11 - 60 umol/L Final    PATHOLOGY: for ASHON, ROSENBERG ([id number]) Patient: KAREM, TOMASO Collected: 11/15/2013 Client: [facility] Accession: [id number] Received: 11/15/2013 Barney Drain, MD DOB: 04-07-1961 Age: 89 Gender: M Reported: 11/16/2013 [address] Patient Ph: [phone] MRN #: [id number] Linna Hoff [place] Visit #: [id number] Chart #: Phone: Fax: CC: REPORT OF SURGICAL PATHOLOGY FINAL DIAGNOSIS Diagnosis 1. Stomach, polyp(s) - HYPERPLASTIC POLYP WITH SURFACE EROSION AND ASSOCIATED GRANULATION TISSUE. 2. Stomach, biopsy, gastric nodule - FUNDIC GLAND POLYP, BENIGN GASTRIC BODY TYPE MUCOSA AND ANTRAL TYPE MUCOSA WITH REACTIVE / REGENERATIVE CHANGES. - NO EVIDENCE OF HELICOBACTER PYLORI, INTESTINAL METAPLASIA, DYSPLASIA OR MALIGNANCY. Aldona Bar MD Pathologist, Electronic Signature (Case signed 11/16/2013) Specimen Gross and Clinical Information Specimen(s) Obtained: 1. Stomach, polyp(s) 2. Stomach, biopsy, gastric nodule Specimen Clinical Information 2. Pre-op: rectal bleeding and variceal screening; Post-op: portal hypertension, cecal and colon arterial venous malformation, rectal  varices, hemorrhoids; multiple gastric nodules, large gastric polyp Gross 1. Received in formalin is a 1.4 x 1.3 x 0.8 cm tan red mucosal polyp and additional tan mucosal fragments having an aggregate measurement of 1.2 x 1 x 0.3 cm. The polyp is inked and sectioned. The specimen is entirely submitted in two cassettes. A= multiple fragments. B= sectioned polyp. 2. Received in formalin are tan, soft tissue fragments that are submitted in toto. Number: six, Size: 0.3 to 0.7 cm (GP:gt, 11/15/13) Stain(s) used in Diagnosis: The following stain(s) were used in diagnosing the case: Warthin-Starry Stain. The control(s) stained appropriately.  1 of 2 FINAL for LYNTON, CRESCENZO ([id number]) Report signed out from the following location(s) Technical Component performed at [facility]. [address],,[place] [address].IEPP:[id number]., Technical Component performed at [address], [place], [place] [address]. CLIA #: [id number], Interpretation performed at [address], [place],  [address]. CLIA #: [id number],  Urinalysis    Component Value Date/Time   COLORURINE YELLOW 10/24/2013 1850   APPEARANCEUR CLEAR 10/24/2013 1850   LABSPEC >1.030* 10/24/2013 1850   PHURINE 6.0 10/24/2013 1850   GLUCOSEU 100* 10/24/2013 1850   HGBUR TRACE* 10/24/2013 1850   BILIRUBINUR NEGATIVE 10/24/2013 1850   KETONESUR NEGATIVE 10/24/2013 1850   PROTEINUR NEGATIVE 10/24/2013 1850   UROBILINOGEN 0.2 10/24/2013 1850   NITRITE NEGATIVE 10/24/2013 1850   LEUKOCYTESUR NEGATIVE 10/24/2013 1850    RADIOGRAPHIC STUDIES:   ASSESSMENT:  #1.#1. 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 #2. Right quadrant bowel pain probably secondary to stretching  of the liver capsule and cholelithiasis.,, No evidence of hepatocellular carcinoma transformation.  #3. History of iron deficiency, awaiting today's ferritin and soluble transferrin receptor report, status post Feraheme on 11/15/2013.      PLAN:  #1. If evidence of iron deficiency is found, additional intravenous iron will be administered. #2. Followup in 2 months with CBC, ferritin, and cycle transferrin receptor.   All questions were answered. The patient knows to call the clinic with any problems, questions or concerns. We can certainly see the patient much sooner if necessary.   I spent 25 minutes counseling the patient face to face. The total time spent in the appointment was 30 minutes.    Farrel Gobble, MD 12/27/2013 1:53 PM  DISCLAIMER:  This note was dictated with voice recognition software.  Similar sounding words can inadvertently be transcribed inaccurately and may not be corrected upon review.

## 2013-12-30 ENCOUNTER — Other Ambulatory Visit (HOSPITAL_COMMUNITY): Payer: Self-pay | Admitting: Hematology and Oncology

## 2013-12-30 LAB — SOLUBLE TRANSFERRIN RECEPTOR: TRANSFERRIN RECEPTOR, SOLUBLE: 1.05 mg/L (ref 0.76–1.76)

## 2013-12-30 MED ORDER — OXYCODONE HCL 10 MG PO TABS: ORAL_TABLET | ORAL | Status: DC

## 2014-01-06 NOTE — Progress Notes (Signed)
Labs drawn

## 2014-01-13 ENCOUNTER — Other Ambulatory Visit (HOSPITAL_COMMUNITY): Payer: Self-pay | Admitting: Internal Medicine

## 2014-01-13 DIAGNOSIS — K7460 Unspecified cirrhosis of liver: Secondary | ICD-10-CM

## 2014-01-16 ENCOUNTER — Encounter (HOSPITAL_COMMUNITY): Payer: Self-pay | Admitting: Emergency Medicine

## 2014-01-16 ENCOUNTER — Emergency Department (HOSPITAL_COMMUNITY): Payer: Medicare Other

## 2014-01-16 ENCOUNTER — Emergency Department (HOSPITAL_COMMUNITY)
Admission: EM | Admit: 2014-01-16 | Discharge: 2014-01-16 | Disposition: A | Discharge status: Home / Self Care | Payer: Medicare Other | Attending: Emergency Medicine | Admitting: Emergency Medicine

## 2014-01-16 DIAGNOSIS — E119 Type 2 diabetes mellitus without complications: Secondary | ICD-10-CM | POA: Insufficient documentation

## 2014-01-16 DIAGNOSIS — I10 Essential (primary) hypertension: Secondary | ICD-10-CM | POA: Insufficient documentation

## 2014-01-16 DIAGNOSIS — Z8619 Personal history of other infectious and parasitic diseases: Secondary | ICD-10-CM | POA: Insufficient documentation

## 2014-01-16 DIAGNOSIS — Z6841 Body Mass Index (BMI) 40.0 and over, adult: Secondary | ICD-10-CM | POA: Insufficient documentation

## 2014-01-16 DIAGNOSIS — K219 Gastro-esophageal reflux disease without esophagitis: Secondary | ICD-10-CM | POA: Insufficient documentation

## 2014-01-16 DIAGNOSIS — R109 Unspecified abdominal pain: Secondary | ICD-10-CM | POA: Insufficient documentation

## 2014-01-16 DIAGNOSIS — Z794 Long term (current) use of insulin: Secondary | ICD-10-CM | POA: Insufficient documentation

## 2014-01-16 DIAGNOSIS — R609 Edema, unspecified: Secondary | ICD-10-CM | POA: Insufficient documentation

## 2014-01-16 DIAGNOSIS — Z862 Personal history of diseases of the blood and blood-forming organs and certain disorders involving the immune mechanism: Secondary | ICD-10-CM | POA: Insufficient documentation

## 2014-01-16 DIAGNOSIS — R0600 Dyspnea, unspecified: Secondary | ICD-10-CM

## 2014-01-16 DIAGNOSIS — R143 Flatulence: Secondary | ICD-10-CM

## 2014-01-16 DIAGNOSIS — Z79899 Other long term (current) drug therapy: Secondary | ICD-10-CM | POA: Insufficient documentation

## 2014-01-16 DIAGNOSIS — R141 Gas pain: Secondary | ICD-10-CM | POA: Insufficient documentation

## 2014-01-16 DIAGNOSIS — Z8669 Personal history of other diseases of the nervous system and sense organs: Secondary | ICD-10-CM | POA: Insufficient documentation

## 2014-01-16 DIAGNOSIS — J441 Chronic obstructive pulmonary disease with (acute) exacerbation: Secondary | ICD-10-CM | POA: Insufficient documentation

## 2014-01-16 DIAGNOSIS — R142 Eructation: Secondary | ICD-10-CM

## 2014-01-16 LAB — COMPREHENSIVE METABOLIC PANEL
ALK PHOS: 126 U/L — AB (ref 39–117)
ALT: 98 U/L — AB (ref 0–53)
AST: 212 U/L — AB (ref 0–37)
Albumin: 2.5 g/dL — ABNORMAL LOW (ref 3.5–5.2)
BILIRUBIN TOTAL: 1.9 mg/dL — AB (ref 0.3–1.2)
BUN: 18 mg/dL (ref 6–23)
CHLORIDE: 108 meq/L (ref 96–112)
CO2: 21 mEq/L (ref 19–32)
Calcium: 8.2 mg/dL — ABNORMAL LOW (ref 8.4–10.5)
Creatinine, Ser: 0.74 mg/dL (ref 0.50–1.35)
GLUCOSE: 281 mg/dL — AB (ref 70–99)
Potassium: 4.2 mEq/L (ref 3.7–5.3)
SODIUM: 140 meq/L (ref 137–147)
Total Protein: 6.6 g/dL (ref 6.0–8.3)

## 2014-01-16 LAB — CBC WITH DIFFERENTIAL/PLATELET
BASOS ABS: 0 10*3/uL (ref 0.0–0.1)
BASOS PCT: 0 % (ref 0–1)
Eosinophils Absolute: 0.1 10*3/uL (ref 0.0–0.7)
Eosinophils Relative: 3 % (ref 0–5)
HCT: 31.9 % — ABNORMAL LOW (ref 39.0–52.0)
Hemoglobin: 11.1 g/dL — ABNORMAL LOW (ref 13.0–17.0)
Lymphocytes Relative: 25 % (ref 12–46)
Lymphs Abs: 0.8 10*3/uL (ref 0.7–4.0)
MCH: 32.6 pg (ref 26.0–34.0)
MCHC: 34.8 g/dL (ref 30.0–36.0)
MCV: 93.8 fL (ref 78.0–100.0)
Monocytes Absolute: 0.4 10*3/uL (ref 0.1–1.0)
Monocytes Relative: 13 % — ABNORMAL HIGH (ref 3–12)
NEUTROS ABS: 1.8 10*3/uL (ref 1.7–7.7)
Neutrophils Relative %: 59 % (ref 43–77)
Platelets: 63 10*3/uL — ABNORMAL LOW (ref 150–400)
RBC: 3.4 MIL/uL — ABNORMAL LOW (ref 4.22–5.81)
RDW: 14.8 % (ref 11.5–15.5)
WBC: 3 10*3/uL — ABNORMAL LOW (ref 4.0–10.5)

## 2014-01-16 LAB — TROPONIN I

## 2014-01-16 LAB — CBG MONITORING, ED: Glucose-Capillary: 249 mg/dL — ABNORMAL HIGH (ref 70–99)

## 2014-01-16 LAB — PRO B NATRIURETIC PEPTIDE: Pro B Natriuretic peptide (BNP): 153.4 pg/mL — ABNORMAL HIGH (ref 0–125)

## 2014-01-16 MED ORDER — SODIUM CHLORIDE 0.9 % IJ SOLN
INTRAMUSCULAR | Status: AC
  Filled 2014-01-16: qty 500

## 2014-01-16 MED ORDER — ONDANSETRON HCL 4 MG/2ML IJ SOLN
4.0000 mg | Freq: Once | INTRAMUSCULAR | Status: AC
  Administered 2014-01-16: 4 mg via INTRAVENOUS
  Filled 2014-01-16: qty 2

## 2014-01-16 MED ORDER — HYDROMORPHONE HCL PF 1 MG/ML IJ SOLN
1.0000 mg | Freq: Once | INTRAMUSCULAR | Status: AC
  Administered 2014-01-16: 1 mg via INTRAVENOUS
  Filled 2014-01-16: qty 1

## 2014-01-16 MED ORDER — IOHEXOL 350 MG/ML SOLN
100.0000 mL | Freq: Once | INTRAVENOUS | Status: AC | PRN
  Administered 2014-01-16: 100 mL via INTRAVENOUS

## 2014-01-16 NOTE — ED Notes (Signed)
Pt ambulatory to waiting from parking lot without difficulty.  nad noted.

## 2014-01-16 NOTE — Discharge Instructions (Signed)
Tests show no life-threatening condition. Followup with [name].

## 2014-01-16 NOTE — ED Provider Notes (Addendum)
CSN: [id number]     Arrival date & time 01/16/14  1411 History   First MD Initiated Contact with Patient 01/16/14 1518     Chief Complaint  Patient presents with  . Shortness of Breath    Patient is a 53 y.o. male presenting with shortness of breath. The history is provided by the patient and the spouse. No language interpreter was used.  Shortness of Breath Associated symptoms: abdominal pain    This chart was scribed for Cory Hutching, MD by Andrew Au, ED Scribe. This patient was seen in room APA03/APA03 and the patient's care was started at 6:10 PM.  HPI Comments:  [name] is a 53 y.o. male who present to the Emergency Department complaining of SOB with associated diarrhea and  fluid in both abdomen and legs. Pt was admitted to hospital 7 months ago for similar problem. At that that time he had fluid removed from his abdomen. Per wife pt sleeps sitting up due to SOB. Pt is in ? need of a liver transplant due to cirrhosis secondary to alcohol.  No substernal chest pain, fever, chills. Severity is mild to moderate.  Past Medical History  Diagnosis Date  . Hypertension   . Diabetes mellitus   . GERD (gastroesophageal reflux disease)     DEC 2010 EGD/Bx REACTIVE GASTROPATHY, NO VARICES  . Hemorrhoids, internal   . BMI 40.0-44.9, adult OCT 2010 269 LBS    APR 2012 279 LBS AUG 2014 185 LBS  . 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 IV drug abuse REMOTE  . Hepatitis 2010 HEP C    AST 509 ALT 267 ALK PHOS 165 ALB 3.8 NEG IGM HAV/HBSAg  . Gallstone AUG 2012 1 CM  . GERD 10/25/2009  . COPD (chronic obstructive pulmonary disease)   . Hepatitis C   . Pancytopenia 2013  . Splenomegaly 2013  . Other pancytopenia 02/18/2013  . Iron (Fe) deficiency anemia 02/18/2013  . Splenomegaly 02/18/2013  . Neuropathy    Past Surgical History  Procedure Laterality Date  . Sigmoidoscopy      2001 DR. FLEISCHMAN INTERNAL HERMORRHOIDS  . Upper gastrointestinal endoscopy  DEC 2010     BENIGN POLYPS, GASTRITIS, ?phg  . Knee surgery  RIGHT  . Hemorrhoid surgery    . Esophageal biopsy  09/08/2011    Dr. Darrick Penna:Moderate gastritis/Polyps, multiple in the body of the stomach  . Colonoscopy with propofol N/A 11/15/2013    Procedure: COLONOSCOPY WITH PROPOFOL;  Surgeon: West Bali, MD;  Location: [facility];  Service: Endoscopy;  Laterality: N/A;  0846 in cecum; withdrawal time 17 minutes  . Esophagogastroduodenoscopy (egd) with propofol N/A 11/15/2013    Procedure: ESOPHAGOGASTRODUODENOSCOPY (EGD) WITH PROPOFOL;  Surgeon: [place] Bali, MD;  Location: [facility];  Service: Endoscopy;  Laterality: N/A;   Family History  Problem Relation Age of Onset  . Colon cancer Neg Hx   . Anesthesia problems Neg Hx   . Hypotension Neg Hx   . Malignant hyperthermia Neg Hx   . Pseudochol deficiency Neg Hx   . Cancer Father    History  Substance Use Topics  . Smoking status: Never Smoker   . Smokeless tobacco: Never Used  . Alcohol Use: No     Comment: 30 years ago    Review of Systems  Respiratory: Positive for shortness of breath.   Gastrointestinal: Positive for abdominal pain and abdominal distention.  A complete 10 system review of systems was  obtained and all systems are negative except as noted in the HPI and PMH.    Allergies  Review of patient's allergies indicates no known allergies.  Home Medications   Prior to Admission medications   Medication Sig Start Date End Date Taking? Authorizing Provider  albuterol (PROVENTIL HFA;VENTOLIN HFA) 108 (90 BASE) MCG/ACT inhaler Inhale 2 puffs into the lungs every 6 (six) hours as needed. Shortness of breath   Yes Historical Provider, MD  esomeprazole (NEXIUM) 40 MG capsule Take 1 capsule (40 mg total) by mouth 2 (two) times daily before a meal. 11/17/13  Yes Orvil Feil, NP  furosemide (LASIX) 20 MG tablet Take 2 tablets (40 mg total) by mouth daily. 12/27/13  Yes Farrel Gobble, MD  glipiZIDE (GLUCOTROL) 10 MG tablet Take 10 mg by mouth  daily.    Yes Historical Provider, MD  hydrOXYzine (ATARAX/VISTARIL) 10 MG tablet Take 1 tablet (10 mg total) by mouth every 4 (four) hours as needed for itching. 12/27/13  Yes Farrel Gobble, MD  insulin glargine (LANTUS) 100 UNIT/ML injection Inject 10 Units into the skin at bedtime.   Yes Historical Provider, MD  Oxycodone HCl 10 MG TABS Take 10-20 mg by mouth every 4 (four) hours as needed (for pain).   Yes Historical Provider, MD  spironolactone (ALDACTONE) 50 MG tablet Take 2 tablets (100 mg total) by mouth daily. 12/27/13  Yes Farrel Gobble, MD   Triage Vitals- BP 132/60  Pulse 73  Temp(Src) 98 F (36.7 C) (Oral)  Resp 24  Ht $R'5\' 8"'oJ$  (1.727 m)  Wt 288 lb (130.636 kg)  BMI 43.80 kg/m2  SpO2 99% Physical Exam  Nursing note and vitals reviewed. Constitutional: He is oriented to person, place, and time. He appears well-developed and well-nourished.  HENT:  Head: Normocephalic and atraumatic.  Eyes: Conjunctivae and EOM are normal. Pupils are equal, round, and reactive to light.  Neck: Normal range of motion. Neck supple.  Cardiovascular: Normal rate, regular rhythm and normal heart sounds.   Pulmonary/Chest: Effort normal and breath sounds normal.  Abdominal: Soft. Bowel sounds are normal.  Tenderness to right lateral abdomen  Musculoskeletal: Normal range of motion.  2+ edema in bilateral legs.  Neurological: He is alert and oriented to person, place, and time.  Skin: Skin is warm and dry.  Psychiatric: He has a normal mood and affect. His behavior is normal.    ED Course  Procedures (including critical care time) DIAGNOSTIC STUDIES: Oxygen Saturation is 99% on RA, normal by my interpretation.    COORDINATION OF CARE: 3:44 PM-Discussed treatment plan which includes admission into hospital with pt at bedside and pt agreed to plan.   Labs Review Labs Reviewed  COMPREHENSIVE METABOLIC PANEL - Abnormal; Notable for the following:    Glucose, Bld 281 (*)    Calcium 8.2  (*)    Albumin 2.5 (*)    AST 212 (*)    ALT 98 (*)    Alkaline Phosphatase 126 (*)    Total Bilirubin 1.9 (*)    All other components within normal limits  CBC WITH DIFFERENTIAL - Abnormal; Notable for the following:    WBC 3.0 (*)    RBC 3.40 (*)    Hemoglobin 11.1 (*)    HCT 31.9 (*)    Platelets 63 (*)    Monocytes Relative 13 (*)    All other components within normal limits  PRO B NATRIURETIC PEPTIDE - Abnormal; Notable for the following:    Pro B Natriuretic peptide (  BNP) 153.4 (*)    All other components within normal limits  CBG MONITORING, ED - Abnormal; Notable for the following:    Glucose-Capillary 249 (*)    All other components within normal limits  TROPONIN I    Imaging Review Dg Chest 2 View  01/16/2014   CLINICAL DATA:  Shortness of breath, cough, symptoms for 2-3 days, history hypertension, diabetes, cirrhosis, hepatitis, COPD, GERD  EXAM: CHEST  2 VIEW  COMPARISON:  10/24/2013  FINDINGS: Borderline enlargement of cardiac silhouette.  Mediastinal contours and pulmonary vascularity normal.  Slight chronic accentuation of RIGHT basilar markings likely related to chronic low inspiratory lung volumes.  No definite acute infiltrate, pleural effusion or pneumothorax.  Minor scattered endplate spur formation thoracic spine.  IMPRESSION: No acute abnormalities.  Borderline enlargement of cardiac silhouette.   Electronically Signed   By: Lavonia Dana M.D.   On: 01/16/2014 15:10   Ct Angio Chest Pe W/cm &/or Wo Cm  01/16/2014   CLINICAL DATA:  Shortness breast discuss that shortness of breath. Evaluate for pulmonary embolism. History of cirrhosis.  EXAM: CT ANGIOGRAPHY CHEST WITH CONTRAST  TECHNIQUE: Multidetector CT imaging of the chest was performed using the standard protocol during bolus administration of intravenous contrast. Multiplanar CT image reconstructions and MIPs were obtained to evaluate the vascular anatomy.  CONTRAST:  121mL OMNIPAQUE IOHEXOL 350 MG/ML SOLN   COMPARISON:  Chest radiograph 01/16/2014.  FINDINGS: There is bilateral moderate gynecomastia.  Heart is mildly enlarged.  Thoracic aorta is normal in caliber enhancement. No evidence of atherosclerosis.  Evaluation of the pulmonary arterial tree is slightly limited due to patient respiratory motion at the lung bases. No filling defects are identified within the pulmonary arterial tree.  Negative for pleural or pericardial effusion. Negative for lymphadenopathy.  There is a cluster of blebs in the right middle lobe. Two subcentimeter calcified granulomas are noted in the right middle lobe. No airspace disease, pulmonary mass, or interstitial abnormality is identified. The trachea and mainstem bronchi are patent.  Thoracic spine vertebral bodies are normal in height and alignment. Contiguous anterior osteophytes are present in the thoracic spine. No suspicious osseous abnormality. Negative for fracture.  Imaging of the upper abdomen demonstrates hepatic cirrhosis, ascites, and splenomegaly.  Review of the MIP images confirms the above findings.  IMPRESSION: 1. No evidence of pulmonary embolism. Slight limitation due to patient respiratory motion affecting the lung bases. 2. No acute findings in the lungs. 3. Hepatic cirrhosis, upper abdominal ascites, and splenomegaly. 4. Mild cardiomegaly. 5. Cluster of blebs in the right middle lobe.   Electronically Signed   By: Curlene Dolphin M.D.   On: 01/16/2014 17:27     EKG Interpretation None      MDM   Final diagnoses:  Dyspnea    CT angiogram shows no pulmonary embolism. No acute findings. Liver function, pancytopenia similar to previous labs  I personally performed the services described in this documentation, which was scribed in my presence. The recorded information has been reviewed and is accurate.   Nat Christen, MD 01/16/14 [place], MD 01/16/14 [phone]

## 2014-01-16 NOTE — ED Notes (Signed)
Sob, sweats,  Diarrhea, low abd pain,

## 2014-01-19 ENCOUNTER — Other Ambulatory Visit (HOSPITAL_COMMUNITY): Payer: Self-pay | Admitting: Hematology and Oncology

## 2014-01-19 MED ORDER — OXYCODONE HCL 10 MG PO TABS
ORAL_TABLET | ORAL | Status: DC
Start: 2014-01-19 — End: 2014-01-29

## 2014-01-29 ENCOUNTER — Emergency Department (HOSPITAL_COMMUNITY): Payer: Medicare Other

## 2014-01-29 ENCOUNTER — Inpatient Hospital Stay (HOSPITAL_COMMUNITY)
Admission: EM | Admit: 2014-01-29 | Discharge: 2014-01-31 | DRG: 871 | Disposition: A | Discharge status: Home / Self Care | Payer: Medicare Other | Attending: Internal Medicine | Admitting: Internal Medicine

## 2014-01-29 ENCOUNTER — Encounter (HOSPITAL_COMMUNITY): Payer: Self-pay | Admitting: Emergency Medicine

## 2014-01-29 DIAGNOSIS — B182 Chronic viral hepatitis C: Secondary | ICD-10-CM

## 2014-01-29 DIAGNOSIS — K7460 Unspecified cirrhosis of liver: Secondary | ICD-10-CM | POA: Diagnosis present

## 2014-01-29 DIAGNOSIS — A419 Sepsis, unspecified organism: Principal | ICD-10-CM

## 2014-01-29 DIAGNOSIS — J4489 Other specified chronic obstructive pulmonary disease: Secondary | ICD-10-CM | POA: Diagnosis present

## 2014-01-29 DIAGNOSIS — Z66 Do not resuscitate: Secondary | ICD-10-CM | POA: Diagnosis present

## 2014-01-29 DIAGNOSIS — K7290 Hepatic failure, unspecified without coma: Secondary | ICD-10-CM

## 2014-01-29 DIAGNOSIS — R32 Unspecified urinary incontinence: Secondary | ICD-10-CM | POA: Diagnosis present

## 2014-01-29 DIAGNOSIS — D61818 Other pancytopenia: Secondary | ICD-10-CM | POA: Diagnosis present

## 2014-01-29 DIAGNOSIS — Z794 Long term (current) use of insulin: Secondary | ICD-10-CM

## 2014-01-29 DIAGNOSIS — D731 Hypersplenism: Secondary | ICD-10-CM | POA: Diagnosis present

## 2014-01-29 DIAGNOSIS — K219 Gastro-esophageal reflux disease without esophagitis: Secondary | ICD-10-CM | POA: Diagnosis present

## 2014-01-29 DIAGNOSIS — I10 Essential (primary) hypertension: Secondary | ICD-10-CM | POA: Diagnosis present

## 2014-01-29 DIAGNOSIS — K652 Spontaneous bacterial peritonitis: Secondary | ICD-10-CM

## 2014-01-29 DIAGNOSIS — R188 Other ascites: Secondary | ICD-10-CM

## 2014-01-29 DIAGNOSIS — R109 Unspecified abdominal pain: Secondary | ICD-10-CM

## 2014-01-29 DIAGNOSIS — J449 Chronic obstructive pulmonary disease, unspecified: Secondary | ICD-10-CM | POA: Diagnosis present

## 2014-01-29 DIAGNOSIS — E119 Type 2 diabetes mellitus without complications: Secondary | ICD-10-CM

## 2014-01-29 DIAGNOSIS — R791 Abnormal coagulation profile: Secondary | ICD-10-CM | POA: Diagnosis present

## 2014-01-29 DIAGNOSIS — N390 Urinary tract infection, site not specified: Secondary | ICD-10-CM

## 2014-01-29 DIAGNOSIS — E669 Obesity, unspecified: Secondary | ICD-10-CM | POA: Diagnosis present

## 2014-01-29 DIAGNOSIS — E871 Hypo-osmolality and hyponatremia: Secondary | ICD-10-CM

## 2014-01-29 DIAGNOSIS — R509 Fever, unspecified: Secondary | ICD-10-CM

## 2014-01-29 DIAGNOSIS — K7682 Hepatic encephalopathy: Secondary | ICD-10-CM

## 2014-01-29 LAB — URINALYSIS, ROUTINE W REFLEX MICROSCOPIC
Glucose, UA: NEGATIVE mg/dL
NITRITE: NEGATIVE
Protein, ur: 100 mg/dL — AB
Specific Gravity, Urine: >1.03 — ABNORMAL HIGH (ref 1.005–1.030)
UROBILINOGEN UA: 2 mg/dL — AB (ref 0.0–1.0)
pH: 6 (ref 5.0–8.0)

## 2014-01-29 LAB — GLUCOSE, CAPILLARY
Glucose-Capillary: 122 mg/dL — ABNORMAL HIGH (ref 70–99)
Glucose-Capillary: 162 mg/dL — ABNORMAL HIGH (ref 70–99)

## 2014-01-29 LAB — URINE MICROSCOPIC-ADD ON

## 2014-01-29 LAB — COMPREHENSIVE METABOLIC PANEL
ALT: 87 U/L — ABNORMAL HIGH (ref 0–53)
AST: 179 U/L — AB (ref 0–37)
Albumin: 2.6 g/dL — ABNORMAL LOW (ref 3.5–5.2)
Alkaline Phosphatase: 106 U/L (ref 39–117)
BUN: 14 mg/dL (ref 6–23)
CALCIUM: 8.4 mg/dL (ref 8.4–10.5)
CO2: 16 mEq/L — ABNORMAL LOW (ref 19–32)
Chloride: 96 mEq/L (ref 96–112)
Creatinine, Ser: 0.91 mg/dL (ref 0.50–1.35)
GFR calc Af Amer: >90 mL/min (ref >90)
Glucose, Bld: 173 mg/dL — ABNORMAL HIGH (ref 70–99)
Potassium: 4.1 mEq/L (ref 3.7–5.3)
Sodium: 128 mEq/L — ABNORMAL LOW (ref 137–147)
TOTAL PROTEIN: 6.7 g/dL (ref 6.0–8.3)
Total Bilirubin: 2.1 mg/dL — ABNORMAL HIGH (ref 0.3–1.2)

## 2014-01-29 LAB — CBC WITH DIFFERENTIAL/PLATELET
Basophils Absolute: 0 10*3/uL (ref 0.0–0.1)
Basophils Relative: 0 % (ref 0–1)
Eosinophils Absolute: 0 10*3/uL (ref 0.0–0.7)
Eosinophils Relative: 0 % (ref 0–5)
HEMATOCRIT: 31.7 % — AB (ref 39.0–52.0)
HEMOGLOBIN: 11.1 g/dL — AB (ref 13.0–17.0)
LYMPHS ABS: 0.5 10*3/uL — AB (ref 0.7–4.0)
Lymphocytes Relative: 26 % (ref 12–46)
MCH: 32.3 pg (ref 26.0–34.0)
MCHC: 35 g/dL (ref 30.0–36.0)
MCV: 92.2 fL (ref 78.0–100.0)
Monocytes Absolute: 0.3 10*3/uL (ref 0.1–1.0)
Monocytes Relative: 15 % — ABNORMAL HIGH (ref 3–12)
NEUTROS PCT: 59 % (ref 43–77)
Neutro Abs: 1.2 10*3/uL — ABNORMAL LOW (ref 1.7–7.7)
Platelets: 43 10*3/uL — ABNORMAL LOW (ref 150–400)
RBC: 3.44 MIL/uL — AB (ref 4.22–5.81)
RDW: 14.5 % (ref 11.5–15.5)
WBC: 2 10*3/uL — ABNORMAL LOW (ref 4.0–10.5)

## 2014-01-29 LAB — CBG MONITORING, ED: Glucose-Capillary: 140 mg/dL — ABNORMAL HIGH (ref 70–99)

## 2014-01-29 LAB — LIPASE, BLOOD: Lipase: 60 U/L — ABNORMAL HIGH (ref 11–59)

## 2014-01-29 LAB — TROPONIN I: Troponin I: <0.3 ng/mL (ref <0.30)

## 2014-01-29 LAB — AMMONIA: AMMONIA: 89 umol/L — AB (ref 11–60)

## 2014-01-29 LAB — MRSA PCR SCREENING: MRSA BY PCR: NEGATIVE

## 2014-01-29 MED ORDER — MORPHINE SULFATE 2 MG/ML IJ SOLN
2.0000 mg | INTRAMUSCULAR | Status: DC | PRN
  Administered 2014-01-29 – 2014-01-31 (×6): 2 mg via INTRAVENOUS
  Filled 2014-01-29 (×6): qty 1

## 2014-01-29 MED ORDER — CEFTRIAXONE SODIUM 1 G IJ SOLR
1.0000 g | INTRAMUSCULAR | Status: DC
  Administered 2014-01-30: 1 g via INTRAVENOUS
  Filled 2014-01-29 (×3): qty 10

## 2014-01-29 MED ORDER — IBUPROFEN 800 MG PO TABS
800.0000 mg | ORAL_TABLET | Freq: Once | ORAL | Status: AC
  Administered 2014-01-29: 800 mg via ORAL
  Filled 2014-01-29: qty 1

## 2014-01-29 MED ORDER — INSULIN ASPART 100 UNIT/ML ~~LOC~~ SOLN: 0.0000 [IU] | Freq: Every day | SUBCUTANEOUS | Status: DC

## 2014-01-29 MED ORDER — DIPHENHYDRAMINE HCL 25 MG PO CAPS
25.0000 mg | ORAL_CAPSULE | Freq: Four times a day (QID) | ORAL | Status: DC | PRN
  Administered 2014-01-30: 25 mg via ORAL
  Filled 2014-01-29 (×2): qty 1

## 2014-01-29 MED ORDER — ONDANSETRON HCL 4 MG PO TABS: 4.0000 mg | ORAL_TABLET | Freq: Four times a day (QID) | ORAL | Status: DC | PRN

## 2014-01-29 MED ORDER — ACETAMINOPHEN 325 MG PO TABS
650.0000 mg | ORAL_TABLET | Freq: Four times a day (QID) | ORAL | Status: DC | PRN
  Administered 2014-01-29: 650 mg via ORAL
  Filled 2014-01-29: qty 2

## 2014-01-29 MED ORDER — LACTULOSE 10 GM/15ML PO SOLN
30.0000 g | Freq: Two times a day (BID) | ORAL | Status: DC
  Administered 2014-01-29 – 2014-01-31 (×3): 30 g via ORAL
  Filled 2014-01-29 (×4): qty 60

## 2014-01-29 MED ORDER — ALBUTEROL SULFATE (2.5 MG/3ML) 0.083% IN NEBU: 2.5000 mg | INHALATION_SOLUTION | RESPIRATORY_TRACT | Status: DC | PRN

## 2014-01-29 MED ORDER — DEXTROSE 5 % IV SOLN
1.0000 g | Freq: Once | INTRAVENOUS | Status: AC
  Administered 2014-01-29: 1 g via INTRAVENOUS
  Filled 2014-01-29: qty 10

## 2014-01-29 MED ORDER — INSULIN ASPART 100 UNIT/ML ~~LOC~~ SOLN
0.0000 [IU] | Freq: Three times a day (TID) | SUBCUTANEOUS | Status: DC
  Administered 2014-01-30 (×2): 2 [IU] via SUBCUTANEOUS
  Administered 2014-01-31: 3 [IU] via SUBCUTANEOUS
  Administered 2014-01-31: 2 [IU] via SUBCUTANEOUS

## 2014-01-29 MED ORDER — ACETAMINOPHEN 650 MG RE SUPP: 650.0000 mg | Freq: Four times a day (QID) | RECTAL | Status: DC | PRN

## 2014-01-29 MED ORDER — MORPHINE SULFATE 4 MG/ML IJ SOLN
4.0000 mg | Freq: Once | INTRAMUSCULAR | Status: AC
  Administered 2014-01-29: 4 mg via INTRAVENOUS
  Filled 2014-01-29: qty 1

## 2014-01-29 MED ORDER — ONDANSETRON HCL 4 MG/2ML IJ SOLN: 4.0000 mg | Freq: Four times a day (QID) | INTRAMUSCULAR | Status: DC | PRN

## 2014-01-29 MED ORDER — IPRATROPIUM BROMIDE 0.02 % IN SOLN
0.5000 mg | Freq: Four times a day (QID) | RESPIRATORY_TRACT | Status: DC
  Administered 2014-01-29 – 2014-01-30 (×2): 0.5 mg via RESPIRATORY_TRACT
  Filled 2014-01-29 (×2): qty 2.5

## 2014-01-29 MED ORDER — HYDROCOD POLST-CHLORPHEN POLST 10-8 MG/5ML PO LQCR
5.0000 mL | Freq: Once | ORAL | Status: AC
  Administered 2014-01-29: 5 mL via ORAL
  Filled 2014-01-29: qty 5

## 2014-01-29 MED ORDER — SODIUM CHLORIDE 0.9 % IV SOLN
INTRAVENOUS | Status: DC
  Administered 2014-01-29 – 2014-01-30 (×2): via INTRAVENOUS

## 2014-01-29 MED ORDER — ALBUTEROL SULFATE (2.5 MG/3ML) 0.083% IN NEBU
2.5000 mg | INHALATION_SOLUTION | Freq: Four times a day (QID) | RESPIRATORY_TRACT | Status: DC
  Administered 2014-01-29 – 2014-01-30 (×2): 2.5 mg via RESPIRATORY_TRACT
  Filled 2014-01-29 (×2): qty 3

## 2014-01-29 MED ORDER — PANTOPRAZOLE SODIUM 40 MG PO TBEC
40.0000 mg | DELAYED_RELEASE_TABLET | Freq: Two times a day (BID) | ORAL | Status: DC
  Administered 2014-01-29 – 2014-01-31 (×4): 40 mg via ORAL
  Filled 2014-01-29 (×4): qty 1

## 2014-01-29 NOTE — ED Notes (Signed)
Pt requesting something for pain in lower back, pt repositioned, Dr Lacinda Axon notified, additional orders given

## 2014-01-29 NOTE — H&P (Signed)
[facility] History and Physical  [name] ZOX:[id number] DOB: Sep 17, 1960 DOA: 01/29/2014  Referring physician: Dr. Lacinda Axon PCP: Jacqualine Mau, PA-C  Gastroenterologist: [name]  Chief Complaint: back pain, abdominal pain  HPI: [name] is a 53 y.o. male with a history of hepatitis C cirrhosis, presents to the emergency room with complaints of progressive back pain, abdominal pain and generalized weakness and dizziness. Patient reports onset of symptoms approximately one week ago. He had progressive weakness, lightheadedness and was "stumbling around". He's been increasingly fatigued and feels like sleeping all the time. He's also had recurrent chills, but he has not checked his temperature. He's also feels that his abdominal distention is increasing. He's having difficulty putting on his pants. He's had progressive abdominal pain as well as back pain. He feels short of breath, worse when lying down. Denies any nausea, vomiting or diarrhea. Has not had any significant cough. Reports that stools maybe black. He came to the emergency room and was found to be febrile with a temperature of 102. Vitals were otherwise stable. Blood work reveals a pancytopenia which appears chronic. There was concern for underlying sepsis therefore he will be admitted for further workup.    Review of Systems:  Pertinent positives as per HPI, otherwise negative  Past Medical History  Diagnosis Date  . Hypertension   . Diabetes mellitus   . GERD (gastroesophageal reflux disease)     DEC 2010 EGD/Bx REACTIVE GASTROPATHY, NO VARICES  . Hemorrhoids, internal   . BMI 40.0-44.9, adult OCT 2010 269 LBS    APR 2012 279 LBS AUG 2014 185 LBS  . 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 IV drug abuse REMOTE  . Hepatitis 2010 HEP C    AST 509 ALT 267 ALK PHOS 165 ALB 3.8 NEG IGM HAV/HBSAg  . Gallstone AUG 2012 1 CM  . GERD 10/25/2009  . COPD (chronic obstructive pulmonary disease)   .  Hepatitis C   . Pancytopenia 2013  . Splenomegaly 2013  . Other pancytopenia 02/18/2013  . Iron (Fe) deficiency anemia 02/18/2013  . Splenomegaly 02/18/2013  . Neuropathy    Past Surgical History  Procedure Laterality Date  . Sigmoidoscopy      2001 DR. FLEISCHMAN INTERNAL HERMORRHOIDS  . Upper gastrointestinal endoscopy  DEC 2010    BENIGN POLYPS, GASTRITIS, ?phg  . Knee surgery  RIGHT  . Hemorrhoid surgery    . Esophageal biopsy  09/08/2011    [name]:Moderate gastritis/Polyps, multiple in the body of the stomach  . Colonoscopy with propofol N/A 11/15/2013    Procedure: COLONOSCOPY WITH PROPOFOL;  Surgeon: Danie Binder, MD;  Location: [facility];  Service: Endoscopy;  Laterality: N/A;  0846 in cecum; withdrawal time 17 minutes  . Esophagogastroduodenoscopy (egd) with propofol N/A 11/15/2013    Procedure: ESOPHAGOGASTRODUODENOSCOPY (EGD) WITH PROPOFOL;  Surgeon: Danie Binder, MD;  Location: [facility];  Service: Endoscopy;  Laterality: N/A;   Social History:  reports that he has never smoked. He has never used smokeless tobacco. He reports that he uses illicit drugs (Marijuana and Cocaine). He reports that he does not drink alcohol.  No Known Allergies  Family History  Problem Relation Age of Onset  . Colon cancer Neg Hx   . Anesthesia problems Neg Hx   . Hypotension Neg Hx   . Malignant hyperthermia Neg Hx   . Pseudochol deficiency Neg Hx   . Cancer Father      Prior  to Admission medications   Medication Sig Start Date End Date Taking? Authorizing Provider  acetaminophen (TYLENOL) 500 MG tablet Take 1,000 mg by mouth every 6 (six) hours as needed for mild pain.   Yes Historical Provider, MD  albuterol (PROVENTIL HFA;VENTOLIN HFA) 108 (90 BASE) MCG/ACT inhaler Inhale 2 puffs into the lungs every 6 (six) hours as needed. Shortness of breath   Yes Historical Provider, MD  esomeprazole (NEXIUM) 40 MG capsule Take 1 capsule (40 mg total) by mouth 2 (two) times daily before a meal. 11/17/13   Yes Orvil Feil, NP  glipiZIDE (GLUCOTROL) 10 MG tablet Take 10 mg by mouth daily.    Yes Historical Provider, MD  insulin glargine (LANTUS) 100 UNIT/ML injection Inject 10 Units into the skin at bedtime.   Yes Historical Provider, MD   Physical Exam: Filed Vitals:   01/29/14 1447  BP: 127/64  Pulse: 82  Temp: 98.5 F (36.9 C)  Resp:     BP 127/64  Pulse 82  Temp(Src) 98.5 F (36.9 C) (Oral)  Resp 24  Ht $R'5\' 5"'HC$  (1.651 m)  Wt 127.7 kg (281 lb 8.4 oz)  BMI 46.85 kg/m2  SpO2 100%  General:  Appears acutely ill Eyes: PERRL, normal lids, irises & conjunctiva ENT: mucous membranes are dry Neck: no LAD, masses or thyromegaly Cardiovascular: RRR, no m/r/g. 1+ edema b/l Telemetry: SR, no arrhythmias  Respiratory: CTA bilaterally, no w/r/r. Normal respiratory effort. Abdomen: soft, distended, diffusely tender, bs+ Skin: no rash or induration seen on limited exam Musculoskeletal: grossly normal tone BUE/BLE Psychiatric: grossly normal mood and affect, speech fluent and appropriate Neurologic: grossly non-focal.          Labs on Admission:  Basic Metabolic Panel:  Recent Labs Lab 01/29/14 1140  NA 128*  K 4.1  CL 96  CO2 16*  GLUCOSE 173*  BUN 14  CREATININE 0.91  CALCIUM 8.4   Liver Function Tests:  Recent Labs Lab 01/29/14 1140  AST 179*  ALT 87*  ALKPHOS 106  BILITOT 2.1*  PROT 6.7  ALBUMIN 2.6*    Recent Labs Lab 01/29/14 1140  LIPASE 60*    Recent Labs Lab 01/29/14 1140  AMMONIA 89*   CBC:  Recent Labs Lab 01/29/14 1140  WBC 2.0*  NEUTROABS 1.2*  HGB 11.1*  HCT 31.7*  MCV 92.2  PLT 43*   Cardiac Enzymes:  Recent Labs Lab 01/29/14 1140  TROPONINI <0.30    BNP (last 3 results)  Recent Labs  10/24/13 2149 01/16/14 1450  PROBNP 374.8* 153.4*   CBG:  Recent Labs Lab 01/29/14 1235  GLUCAP 140*    Radiological Exams on Admission: Dg Abd Acute W/chest  01/29/2014   CLINICAL DATA:  Ascites, fever, cough, chest  tightness, lower abdominal pain, history hypertension, diabetes, cirrhosis, GERD, COPD  EXAM: ACUTE ABDOMEN SERIES (ABDOMEN 2 VIEW & CHEST 1 VIEW)  COMPARISON:  Chest radiograph 01/16/2014, abdominal radiographs 10/24/2013  FINDINGS: Borderline enlargement of cardiac silhouette.  Mediastinal contours and pulmonary vascularity normal.  Lungs clear.  No pleural effusion or pneumothorax.  Suboptimal assessment of bowel gas pattern due to body habitus.  Paucity of bowel gas.  No gross bowel distention or free intraperitoneal air identified on limited assessment.  IMPRESSION: Limited exam showing no definite acute abdominal findings.  Borderline enlargement of cardiac silhouette.   Electronically Signed   By: Lavonia Dana M.D.   On: 01/29/2014 12:29    EKG: Independently reviewed. Sinus rhythm without acute changes  Assessment/Plan  Active Problems:   HEPATITIS C, CHRONIC   DM   Other pancytopenia   Cirrhosis   Hyponatremia   Fever, unspecified   SBP (spontaneous bacterial peritonitis)   Sepsis   Encephalopathy, hepatic   1. Sepsis. Etiology is not entirely clear although may be related to spontaneous bacterial peritonitis. We'll start the patient on Rocephin. We'll arrange for paracentesis to be performed tomorrow. We'll request a gastroenterology consultation for assistance. 2. Spontaneous bacterial peritonitis. Patient has increasing abdominal distention and abdominal pain with fever. We'll empirically treat with Rocephin. Hopefully paracentesis tomorrow. 3. Hepatic encephalopathy, mild. Patient is feeling fatigued and sleepy. He does not have any asterixis. Ammonia level is elevated. We'll start the patient on lactulose. 4. Hyponatremia. Likely related to volume depletion. We'll start the patient on IV fluids. 5. Hepatitis C cirrhosis. Patient was evaluated in [place], but was not felt to be a candidate for transplant at this time due to his obesity. 6. Pancytopenia. 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 Continue to follow 7. Diabetes. Hold oral agents and use sliding scale insulin  Code Status: DNR Family Communication: discussed with patient and wife at the bedside Disposition Plan: discharge home once improved  Time spent: 33mins  MEMON,JEHANZEB [facility] Pager [phone]  **Disclaimer: This note may have been dictated with voice recognition software. Similar sounding words can inadvertently be transcribed and this note may contain transcription errors which may not have been corrected upon publication of note.**

## 2014-01-29 NOTE — ED Notes (Signed)
Pt presents to er for evaluation of sob, abd distention, lower back pain, difficulty with controlling urination, "staggering", weakness, fever, chills that started Monday, pt states that he has been staggering and falling around all week, has multiple areas of bruising noted to body area, pt has black area noted to bottom of right great toe that pt states "is a blister", sob increases with any exertion, reports that he has more fluid than previously when he was seen in er. Family at bedside states that pt has had decreased appetite , chills, and seemed confused "at times" during this past week, Dr Lacinda Axon at bedside upon pt arrival to er, see edp assessment for further, pt NSR on monitor,

## 2014-01-29 NOTE — ED Provider Notes (Signed)
CSN: [id number]     Arrival date & time 01/29/14  1110 History  This chart was scribed for Nat Christen, MD by Marlowe Kays, ED Scribe. This patient was seen in room [facility] and the patient's care was started at 11:25 AM.  Chief Complaint  Patient presents with  . Fatigue   HPI HPI Comments:  [name] is a 53 y.o. male who presents to the Emergency Department complaining of weakness, SOB, and lower back pain that radiates to his abdomen that started about 6 days ago. He reports associated staggering upon standing, worsening abdominal distension, and leg swelling. He states he has experienced some bladder incontinence stating he has not been able to make it to the bathroom in time. He was seen here approximately two weeks ago. He has made an appt to see [name] and was sent to [facility] and was deemed ineligible for a liver transplant secondary to cirrhosis due to his weight. He reports he is to have an MRI with an unknown doctor with [facility]. Pt denies CP.  Past Medical History  Diagnosis Date  . Hypertension   . Diabetes mellitus   . GERD (gastroesophageal reflux disease)     DEC 2010 EGD/Bx REACTIVE GASTROPATHY, NO VARICES  . Hemorrhoids, internal   . BMI 40.0-44.9, adult OCT 2010 269 LBS    APR 2012 279 LBS AUG 2014 185 LBS  . 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 IV drug abuse REMOTE  . Hepatitis 2010 HEP C    AST 509 ALT 267 ALK PHOS 165 ALB 3.8 NEG IGM HAV/HBSAg  . Gallstone AUG 2012 1 CM  . GERD 10/25/2009  . COPD (chronic obstructive pulmonary disease)   . Hepatitis C   . Pancytopenia 2013  . Splenomegaly 2013  . Other pancytopenia 02/18/2013  . Iron (Fe) deficiency anemia 02/18/2013  . Splenomegaly 02/18/2013  . Neuropathy    Past Surgical History  Procedure Laterality Date  . Sigmoidoscopy      2001 DR. FLEISCHMAN INTERNAL HERMORRHOIDS  . Upper gastrointestinal endoscopy  DEC 2010    BENIGN POLYPS, GASTRITIS, ?phg  . Knee surgery   RIGHT  . Hemorrhoid surgery    . Esophageal biopsy  09/08/2011    [name]:Moderate gastritis/Polyps, multiple in the body of the stomach  . Colonoscopy with propofol N/A 11/15/2013    Procedure: COLONOSCOPY WITH PROPOFOL;  Surgeon: Danie Binder, MD;  Location: [facility];  Service: Endoscopy;  Laterality: N/A;  0846 in cecum; withdrawal time 17 minutes  . Esophagogastroduodenoscopy (egd) with propofol N/A 11/15/2013    Procedure: ESOPHAGOGASTRODUODENOSCOPY (EGD) WITH PROPOFOL;  Surgeon: Danie Binder, MD;  Location: [facility];  Service: Endoscopy;  Laterality: N/A;   Family History  Problem Relation Age of Onset  . Colon cancer Neg Hx   . Anesthesia problems Neg Hx   . Hypotension Neg Hx   . Malignant hyperthermia Neg Hx   . Pseudochol deficiency Neg Hx   . Cancer Father    History  Substance Use Topics  . Smoking status: Never Smoker   . Smokeless tobacco: Never Used  . Alcohol Use: No     Comment: 30 years ago    Review of Systems A complete 10 system review of systems was obtained and all systems are negative except as noted in the HPI and PMH.   Allergies  Review of patient's allergies indicates no known allergies.  Home Medications   Prior to Admission  medications   Medication Sig Start Date End Date Taking? Authorizing Provider  acetaminophen (TYLENOL) 500 MG tablet Take 1,000 mg by mouth every 6 (six) hours as needed for mild pain.   Yes Historical Provider, MD  albuterol (PROVENTIL HFA;VENTOLIN HFA) 108 (90 BASE) MCG/ACT inhaler Inhale 2 puffs into the lungs every 6 (six) hours as needed. Shortness of breath   Yes Historical Provider, MD  esomeprazole (NEXIUM) 40 MG capsule Take 1 capsule (40 mg total) by mouth 2 (two) times daily before a meal. 11/17/13  Yes Orvil Feil, NP  glipiZIDE (GLUCOTROL) 10 MG tablet Take 10 mg by mouth daily.    Yes Historical Provider, MD  insulin glargine (LANTUS) 100 UNIT/ML injection Inject 10 Units into the skin at bedtime.   Yes Historical  Provider, MD   Triage Vitals: BP 149/72  Pulse 94  Temp(Src) 100.4 F (38 C) (Oral)  Resp 24  Ht $R'5\' 7"'KO$  (1.702 m)  Wt 288 lb (130.636 kg)  BMI 45.10 kg/m2  SpO2 99% Physical Exam  Nursing note and vitals reviewed. Constitutional: He is oriented to person, place, and time. He appears well-developed and well-nourished. No distress.  Obese.  HENT:  Head: Normocephalic and atraumatic.  Eyes: Conjunctivae and EOM are normal. Pupils are equal, round, and reactive to light.  Neck: Normal range of motion. Neck supple.  Cardiovascular: Normal rate, regular rhythm and normal heart sounds.   Pulmonary/Chest: Effort normal and breath sounds normal.  Abdominal: Soft. Bowel sounds are normal.  Ascitic abdomen.  Musculoskeletal: Normal range of motion.  Neurological: He is alert and oriented to person, place, and time.  Skin: Skin is warm and dry.  Psychiatric: He has a normal mood and affect. His behavior is normal.    ED Course  Procedures (including critical care time) DIAGNOSTIC STUDIES: Oxygen Saturation is 99% on RA, normal by my interpretation.   COORDINATION OF CARE: 11:32 AM- Will order lab work. Pt verbalizes understanding and agrees to plan.  Medications  cefTRIAXone (ROCEPHIN) 1 g in dextrose 5 % 50 mL IVPB (1 g Intravenous New Bag/Given 01/29/14 1344)  morphine 4 MG/ML injection 4 mg (4 mg Intravenous Given 01/29/14 1315)  ibuprofen (ADVIL,MOTRIN) tablet 800 mg (800 mg Oral Given 01/29/14 1323)    Labs Review Labs Reviewed  COMPREHENSIVE METABOLIC PANEL - Abnormal; Notable for the following:    Sodium 128 (*)    CO2 16 (*)    Glucose, Bld 173 (*)    Albumin 2.6 (*)    AST 179 (*)    ALT 87 (*)    Total Bilirubin 2.1 (*)    All other components within normal limits  CBC WITH DIFFERENTIAL - Abnormal; Notable for the following:    WBC 2.0 (*)    RBC 3.44 (*)    Hemoglobin 11.1 (*)    HCT 31.7 (*)    Platelets 43 (*)    Neutro Abs 1.2 (*)    Lymphs Abs 0.5 (*)     Monocytes Relative 15 (*)    All other components within normal limits  LIPASE, BLOOD - Abnormal; Notable for the following:    Lipase 60 (*)    All other components within normal limits  AMMONIA - Abnormal; Notable for the following:    Ammonia 89 (*)    All other components within normal limits  URINALYSIS, ROUTINE W REFLEX MICROSCOPIC - Abnormal; Notable for the following:    Color, Urine ORANGE (*)    APPearance HAZY (*)  Specific Gravity, Urine >1.030 (*)    Hgb urine dipstick MODERATE (*)    Bilirubin Urine MODERATE (*)    Ketones, ur TRACE (*)    Protein, ur 100 (*)    Urobilinogen, UA 2.0 (*)    Leukocytes, UA TRACE (*)    All other components within normal limits  URINE MICROSCOPIC-ADD ON - Abnormal; Notable for the following:    Bacteria, UA FEW (*)    All other components within normal limits  CBG MONITORING, ED - Abnormal; Notable for the following:    Glucose-Capillary 140 (*)    All other components within normal limits  URINE CULTURE  TROPONIN I    Imaging Review Dg Abd Acute W/chest  01/29/2014   CLINICAL DATA:  Ascites, fever, cough, chest tightness, lower abdominal pain, history hypertension, diabetes, cirrhosis, GERD, COPD  EXAM: ACUTE ABDOMEN SERIES (ABDOMEN 2 VIEW & CHEST 1 VIEW)  COMPARISON:  Chest radiograph 01/16/2014, abdominal radiographs 10/24/2013  FINDINGS: Borderline enlargement of cardiac silhouette.  Mediastinal contours and pulmonary vascularity normal.  Lungs clear.  No pleural effusion or pneumothorax.  Suboptimal assessment of bowel gas pattern due to body habitus.  Paucity of bowel gas.  No gross bowel distention or free intraperitoneal air identified on limited assessment.  IMPRESSION: Limited exam showing no definite acute abdominal findings.  Borderline enlargement of cardiac silhouette.   Electronically Signed   By: Lavonia Dana M.D.   On: 01/29/2014 12:29     EKG Interpretation   Date/Time:  Sunday January 29 2014 11:21:36 EDT Ventricular  Rate:  89 PR Interval:  136 QRS Duration: 91 QT Interval:  372 QTC Calculation: 453 R Axis:   86 Text Interpretation:  Sinus rhythm Anterior infarct, old Confirmed by COOK   MD, BRIAN ([id number]) on 01/29/2014 12:16:51 PM      MDM   Final diagnoses:  CIRRHOSIS  Ascites  UTI (lower urinary tract infection)    Patient has end-stage cirrhosis. Physical exam shows fever and ascites. Urinalysis positive for white cells. Ammonia level 89.  Liver functions slightly elevated.   IV Rocephin. Urine culture.  Admit to Dr. Roderic Palau  I personally performed the services described in this documentation, which was scribed in my presence. The recorded information has been reviewed and is accurate.    Nat Christen, MD 01/29/14 1346

## 2014-01-29 NOTE — ED Notes (Signed)
Pt states weakness, sob, lower back pain radiating to abdomen with hx of cirrhosis. Pt also states swelling is worse than usual to his abdomen, urinary incontinence, and sore noted to right great toe. Symptoms first began Monday.

## 2014-01-29 NOTE — ED Notes (Signed)
Report given to [facility] ICU,

## 2014-01-29 NOTE — ED Notes (Signed)
Pt's wallet given to pt's wife at bedside,

## 2014-01-29 NOTE — ED Notes (Signed)
Dr Lacinda Axon notified of vital signs, additional orders given

## 2014-01-29 NOTE — ED Notes (Signed)
Pt repositioned, states that his back pain is better, pt and family updated on plan of care,

## 2014-01-30 ENCOUNTER — Encounter (HOSPITAL_COMMUNITY): Payer: Self-pay | Admitting: Gastroenterology

## 2014-01-30 ENCOUNTER — Inpatient Hospital Stay (HOSPITAL_COMMUNITY): Payer: Medicare Other

## 2014-01-30 DIAGNOSIS — R109 Unspecified abdominal pain: Secondary | ICD-10-CM

## 2014-01-30 DIAGNOSIS — R509 Fever, unspecified: Secondary | ICD-10-CM

## 2014-01-30 DIAGNOSIS — E119 Type 2 diabetes mellitus without complications: Secondary | ICD-10-CM

## 2014-01-30 DIAGNOSIS — B182 Chronic viral hepatitis C: Secondary | ICD-10-CM

## 2014-01-30 DIAGNOSIS — E871 Hypo-osmolality and hyponatremia: Secondary | ICD-10-CM

## 2014-01-30 LAB — CBC
HEMATOCRIT: 27.6 % — AB (ref 39.0–52.0)
Hemoglobin: 9.6 g/dL — ABNORMAL LOW (ref 13.0–17.0)
MCH: 32.2 pg (ref 26.0–34.0)
MCHC: 34.8 g/dL (ref 30.0–36.0)
MCV: 92.6 fL (ref 78.0–100.0)
PLATELETS: 39 10*3/uL — AB (ref 150–400)
RBC: 2.98 MIL/uL — ABNORMAL LOW (ref 4.22–5.81)
RDW: 14.8 % (ref 11.5–15.5)
WBC: 1.2 10*3/uL — AB (ref 4.0–10.5)

## 2014-01-30 LAB — GLUCOSE, CAPILLARY
GLUCOSE-CAPILLARY: 135 mg/dL — AB (ref 70–99)
Glucose-Capillary: 123 mg/dL — ABNORMAL HIGH (ref 70–99)
Glucose-Capillary: 109 mg/dL — ABNORMAL HIGH (ref 70–99)
Glucose-Capillary: 136 mg/dL — ABNORMAL HIGH (ref 70–99)

## 2014-01-30 LAB — COMPREHENSIVE METABOLIC PANEL
ALK PHOS: 88 U/L (ref 39–117)
ALT: 74 U/L — AB (ref 0–53)
AST: 158 U/L — ABNORMAL HIGH (ref 0–37)
Albumin: 2.2 g/dL — ABNORMAL LOW (ref 3.5–5.2)
BUN: 21 mg/dL (ref 6–23)
CO2: 20 meq/L (ref 19–32)
Calcium: 7.9 mg/dL — ABNORMAL LOW (ref 8.4–10.5)
Chloride: 104 mEq/L (ref 96–112)
Creatinine, Ser: 1.15 mg/dL (ref 0.50–1.35)
GFR, EST AFRICAN AMERICAN: 83 mL/min — AB (ref >90)
GFR, EST NON AFRICAN AMERICAN: 72 mL/min — AB (ref >90)
GLUCOSE: 156 mg/dL — AB (ref 70–99)
POTASSIUM: 4.1 meq/L (ref 3.7–5.3)
Sodium: 133 mEq/L — ABNORMAL LOW (ref 137–147)
Total Bilirubin: 1.8 mg/dL — ABNORMAL HIGH (ref 0.3–1.2)
Total Protein: 5.6 g/dL — ABNORMAL LOW (ref 6.0–8.3)

## 2014-01-30 LAB — PROTIME-INR
INR: 1.57 — ABNORMAL HIGH (ref 0.00–1.49)
PROTHROMBIN TIME: 18.3 s — AB (ref 11.6–15.2)

## 2014-01-30 LAB — AMMONIA: AMMONIA: 73 umol/L — AB (ref 11–60)

## 2014-01-30 MED ORDER — IPRATROPIUM-ALBUTEROL 0.5-2.5 (3) MG/3ML IN SOLN
3.0000 mL | Freq: Four times a day (QID) | RESPIRATORY_TRACT | Status: DC
  Administered 2014-01-30 – 2014-01-31 (×5): 3 mL via RESPIRATORY_TRACT
  Filled 2014-01-30 (×4): qty 3

## 2014-01-30 MED ORDER — OXYCODONE HCL 5 MG PO TABS
5.0000 mg | ORAL_TABLET | ORAL | Status: DC | PRN
  Administered 2014-01-30 – 2014-01-31 (×6): 10 mg via ORAL
  Filled 2014-01-30 (×6): qty 2

## 2014-01-30 MED ORDER — VITAMIN K1 10 MG/ML IJ SOLN
10.0000 mg | Freq: Once | INTRAVENOUS | Status: AC
  Administered 2014-01-30: 10 mg via INTRAVENOUS
  Filled 2014-01-30: qty 1

## 2014-01-30 MED ORDER — GADOBENATE DIMEGLUMINE 529 MG/ML IV SOLN
20.0000 mL | Freq: Once | INTRAVENOUS | Status: AC | PRN
  Administered 2014-01-30: 20 mL via INTRAVENOUS

## 2014-01-30 NOTE — Progress Notes (Signed)
CRITICAL VALUE ALERT  Critical value received:  WBC 1.2  Date of notification:  01/30/14  Time of notification:  0617  Critical value read back: yes   Nurse who received alert:  Candiss Norse RN  MD notified (1st page):  Dr. Curly Rim  Time of first page:  [phone]  MD notified (2nd page):  Time of second page:  Responding MD:  No Reply - patient already placed on Neutropenic precautions last night.  Will pass onto 1st shift RN

## 2014-01-30 NOTE — Progress Notes (Signed)
[facility] PROGRESS NOTE  [name] KTG:[id number] DOB: 1961-06-30 DOA: 01/29/2014 PCP: Jacqualine Mau, PA-C  Assessment/Plan: 1. Sepsis. Etiology is not entirely clear. Differentials include spontaneous bacterial peritonitis, pyelonephritis, possible discitis. Patient will undergo possible diagnostic paracentesis today. He has been started empirically on Rocephin. Followup cultures. Blood cultures and urine culture have been sent. Appreciate GI assistance. He is now afebrile. We'll also obtain MRI of his lumbar spine to rule out discitis since he does appear to be having significant back pain. If all of this workup is unrevealing, may need to consider CT of the abdomen and pelvis. 2. Hepatic encephalopathy, mild. Patient feeling sleepy. Ammonia level remains elevated. Continue on lactulose. 3. Hyponatremia. Likely related to blood the patient. Improved with IV fluids. 4. Hepatitis C cirrhosis. Followup with GI as an outpatient. 5. Pancytopenia, appears to be worse. This may be dilutional. Continue to follow. Likely related to cirrhosis. 6. Diabetes. Sliding scale insulin 7. Coagulopathy. Likely related to cirrhosis. We'll give a dose of vitamin K  Code Status: DNR Family Communication: discussed with patient Disposition Plan: discharge home once improved   Consultants:  Gastroenterology  Procedures:    Antibiotics:  Rocephin 6/21  HPI/Subjective: Still having significant back pain, abdominal pain. Feels short of breath.  Neb treatments are helpful, but then gets short of breath between treatments.  No bowel movements  Objective: Filed Vitals:   01/30/14 0700  BP: 136/66  Pulse: 25  Temp:   Resp: 12    Intake/Output Summary (Last 24 hours) at 01/30/14 0940 Last data filed at 01/30/14 0700  Gross per 24 hour  Intake 1798.75 ml  Output    875 ml  Net 923.75 ml   Filed Weights   01/29/14 1119 01/29/14 1447 01/30/14 0400  Weight: 130.636 kg (288 lb)  127.7 kg (281 lb 8.4 oz) 127.4 kg (280 lb 13.9 oz)    Exam:   General:  NAD  Cardiovascular: S1, S2 RRR  Respiratory: CTA B  Abdomen: soft, diffusely tender, bs+, distended  Musculoskeletal: tenderness to palpation over lumbar spine and flanks bilaterally   Data Reviewed: Basic Metabolic Panel:  Recent Labs Lab 01/29/14 1140 01/30/14 0435  NA 128* 133*  K 4.1 4.1  CL 96 104  CO2 16* 20  GLUCOSE 173* 156*  BUN 14 21  CREATININE 0.91 1.15  CALCIUM 8.4 7.9*   Liver Function Tests:  Recent Labs Lab 01/29/14 1140 01/30/14 0435  AST 179* 158*  ALT 87* 74*  ALKPHOS 106 88  BILITOT 2.1* 1.8*  PROT 6.7 5.6*  ALBUMIN 2.6* 2.2*    Recent Labs Lab 01/29/14 1140  LIPASE 60*    Recent Labs Lab 01/29/14 1140 01/30/14 0435  AMMONIA 89* 73*   CBC:  Recent Labs Lab 01/29/14 1140 01/30/14 0435  WBC 2.0* 1.2*  NEUTROABS 1.2*  --   HGB 11.1* 9.6*  HCT 31.7* 27.6*  MCV 92.2 92.6  PLT 43* 39*   Cardiac Enzymes:  Recent Labs Lab 01/29/14 1140  TROPONINI <0.30   BNP (last 3 results)  Recent Labs  10/24/13 2149 01/16/14 1450  PROBNP 374.8* 153.4*   CBG:  Recent Labs Lab 01/29/14 1235 01/29/14 1644 01/29/14 2112  GLUCAP 140* 122* 162*    Recent Results (from the past 240 hour(s))  MRSA PCR SCREENING     Status: None   Collection Time    01/29/14  2:30 PM      Result Value Ref Range Status   MRSA by PCR  NEGATIVE  NEGATIVE Final   Comment:            The GeneXpert MRSA Assay (FDA     approved for NASAL specimens     only), is one component of a     comprehensive MRSA colonization     surveillance program. It is not     intended to diagnose MRSA     infection nor to guide or     monitor treatment for     MRSA infections.     Studies: Dg Abd Acute W/chest  01/29/2014   CLINICAL DATA:  Ascites, fever, cough, chest tightness, lower abdominal pain, history hypertension, diabetes, cirrhosis, GERD, COPD  EXAM: ACUTE ABDOMEN SERIES  (ABDOMEN 2 VIEW & CHEST 1 VIEW)  COMPARISON:  Chest radiograph 01/16/2014, abdominal radiographs 10/24/2013  FINDINGS: Borderline enlargement of cardiac silhouette.  Mediastinal contours and pulmonary vascularity normal.  Lungs clear.  No pleural effusion or pneumothorax.  Suboptimal assessment of bowel gas pattern due to body habitus.  Paucity of bowel gas.  No gross bowel distention or free intraperitoneal air identified on limited assessment.  IMPRESSION: Limited exam showing no definite acute abdominal findings.  Borderline enlargement of cardiac silhouette.   Electronically Signed   By: Lavonia Dana M.D.   On: 01/29/2014 12:29    Scheduled Meds: . cefTRIAXone (ROCEPHIN)  IV  1 g Intravenous Q24H  . insulin aspart  0-15 Units Subcutaneous TID WC  . insulin aspart  0-5 Units Subcutaneous QHS  . ipratropium-albuterol  3 mL Nebulization Q6H  . lactulose  30 g Oral BID  . pantoprazole  40 mg Oral BID   Continuous Infusions: . sodium chloride 75 mL/hr at 01/30/14 0700    Active Problems:   HEPATITIS C, CHRONIC   DM   Other pancytopenia   Cirrhosis   Hyponatremia   Fever, unspecified   SBP (spontaneous bacterial peritonitis)   Sepsis   Encephalopathy, hepatic    Time spent: 98mins    MEMON,JEHANZEB  [facility] Pager [phone]. If 7PM-7AM, please contact night-coverage at [email or web address], password [facility] 01/30/2014, 9:40 AM  LOS: 1 day

## 2014-01-30 NOTE — Progress Notes (Signed)
Report called and given to [facility], Therapist, sports. Patient currently in MRI. Patient will be transported to dept 300, room 324 from MRI. Patient's family members in room at this time. Will collect patient's belongings and move them to room 324, will take patient's wife and family member to wait in room 324 also.

## 2014-01-30 NOTE — Consult Note (Signed)
REVIEWED. Agree.

## 2014-01-30 NOTE — Progress Notes (Signed)
UR chart review completed.  

## 2014-01-30 NOTE — Consult Note (Signed)
Referring Provider: Dr. Roderic Palau Primary Care Physician:  Montey Hora Primary Gastroenterologist:  [name]   Date of Admission: 01/29/14 Date of Consultation: 01/30/14  Reason for Consultation:  Fever, possible SBP  HPI:  [name] is a 53 year old male with history of genotype 3a Hepatitis C, cirrhosis, followed by Dr. Patsy Baltimore. He has failed treatment with ribavirin/interferon in past due to side effects. Last seen as an outpatient in March/April 2015. 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 Next EGD in April 2016.  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    States he couldn't get his breath, hurting in his back and sides, felt bloated and felt like he had fluid in his abdomen. Mostly "hurting" and couldn't get his breath. Staggering, losing balance. Abdominal soreness lower abdomen. Onset last Monday. Just wanted to sleep. Slept all day Wednesday evening, all day Thursday and Friday. Unable to stay awake. Reported fever, chills. "shivering" while laying in bed. Admitting temp 102, now afebrile. No N/V. Denies diarrhea. Just felt run down. No urinary burning, frequency. States urine was "dark as tea". Lower back pain is a new symptom. No appetite in past week. States lower extremity edema. States last time he weighed at home was 288. This admission 280. Breathing treatments help but then starts "gasping for breath again". Not taking Lactulose at home due to diarrhea.   Dr. Patsy Baltimore for Hepatitis C treatment. Seen in [place] for consideration of liver transplant; needs to lose weight, treat Hep C.     Past Medical History  Diagnosis Date  . Hypertension   . Diabetes mellitus   . GERD (gastroesophageal reflux disease)     DEC 2010 EGD/Bx REACTIVE GASTROPATHY, NO VARICES  . Hemorrhoids, internal   . BMI 40.0-44.9, adult OCT 2010 269 LBS    APR 2012 279 LBS AUG  2014 185 LBS  . 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 IV drug abuse REMOTE  . Hepatitis 2010 HEP C    AST 509 ALT 267 ALK PHOS 165 ALB 3.8 NEG IGM HAV/HBSAg  . Gallstone AUG 2012 1 CM  . GERD 10/25/2009  . COPD (chronic obstructive pulmonary disease)   . Hepatitis C   . Pancytopenia 2013  . Splenomegaly 2013  . Other pancytopenia 02/18/2013  . Iron (Fe) deficiency anemia 02/18/2013  . Splenomegaly 02/18/2013  . Neuropathy     Past Surgical History  Procedure Laterality Date  . Sigmoidoscopy      2001 DR. FLEISCHMAN INTERNAL HERMORRHOIDS  . Upper gastrointestinal endoscopy  DEC 2010    BENIGN POLYPS, GASTRITIS, ?phg  . Knee surgery  RIGHT  . Hemorrhoid surgery    . Esophageal biopsy  09/08/2011    [name]:Moderate gastritis/Polyps, multiple in the body of the stomach  . Colonoscopy with propofol N/A 11/15/2013    [name]: rectal varices, small AVMs  . Esophagogastroduodenoscopy (egd) with propofol N/A 11/15/2013    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 Next EGD in April 2016.      Prior to Admission medications   Medication Sig Start Date End Date Taking? Authorizing Provider  acetaminophen (TYLENOL) 500 MG tablet Take 1,000 mg by mouth every 6 (six) hours as needed for mild pain.   Yes Historical Provider, MD  albuterol (PROVENTIL HFA;VENTOLIN HFA) 108 (90  BASE) MCG/ACT inhaler Inhale 2 puffs into the lungs every 6 (six) hours as needed. Shortness of breath   Yes Historical Provider, MD  esomeprazole (NEXIUM) 40 MG capsule Take 1 capsule (40 mg total) by mouth 2 (two) times daily before a meal. 11/17/13  Yes Orvil Feil, NP  glipiZIDE (GLUCOTROL) 10 MG tablet Take 10 mg by mouth daily.    Yes Historical Provider, MD  insulin glargine (LANTUS) 100 UNIT/ML injection Inject 10 Units into the skin at bedtime.   Yes Historical Provider, MD    Current Facility-Administered Medications  Medication Dose  Route Frequency Provider Last Rate Last Dose  . 0.9 %  sodium chloride infusion   Intravenous Continuous Kathie Dike, MD 75 mL/hr at 01/30/14 0700    . acetaminophen (TYLENOL) tablet 650 mg  650 mg Oral Q6H PRN Kathie Dike, MD   650 mg at 01/29/14 2233   Or  . acetaminophen (TYLENOL) suppository 650 mg  650 mg Rectal Q6H PRN Kathie Dike, MD      . albuterol (PROVENTIL) (2.5 MG/3ML) 0.083% nebulizer solution 2.5 mg  2.5 mg Nebulization Q2H PRN Kathie Dike, MD      . cefTRIAXone (ROCEPHIN) 1 g in dextrose 5 % 50 mL IVPB  1 g Intravenous Q24H Kathie Dike, MD      . diphenhydrAMINE (BENADRYL) capsule 25 mg  25 mg Oral Q6H PRN Kathie Dike, MD   25 mg at 01/30/14 0042  . insulin aspart (novoLOG) injection 0-15 Units  0-15 Units Subcutaneous TID WC Kathie Dike, MD      . insulin aspart (novoLOG) injection 0-5 Units  0-5 Units Subcutaneous QHS Kathie Dike, MD      . ipratropium-albuterol (DUONEB) 0.5-2.5 (3) MG/3ML nebulizer solution 3 mL  3 mL Nebulization Q6H Kathie Dike, MD   3 mL at 01/30/14 0831  . lactulose (CHRONULAC) 10 GM/15ML solution 30 g  30 g Oral BID Kathie Dike, MD   30 g at 01/29/14 2043  . morphine 2 MG/ML injection 2 mg  2 mg Intravenous Q4H PRN Kathie Dike, MD   2 mg at 01/30/14 0709  . ondansetron (ZOFRAN) tablet 4 mg  4 mg Oral Q6H PRN Kathie Dike, MD       Or  . ondansetron (ZOFRAN) injection 4 mg  4 mg Intravenous Q6H PRN Kathie Dike, MD      . pantoprazole (PROTONIX) EC tablet 40 mg  40 mg Oral BID Kathie Dike, MD   40 mg at 01/29/14 2044    Allergies as of 01/29/2014  . (No Known Allergies)    Family History  Problem Relation Age of Onset  . Colon cancer Neg Hx   . Anesthesia problems Neg Hx   . Hypotension Neg Hx   . Malignant hyperthermia Neg Hx   . Pseudochol deficiency Neg Hx   . Cancer Father     History   Social History  . Marital Status: Married    Spouse Name: N/A    Number of Children: N/A  . Years of  Education: N/A   Occupational History  . Not on file.   Social History Main Topics  . Smoking status: Never Smoker   . Smokeless tobacco: Never Used  . Alcohol Use: No     Comment: 30 years ago  . Drug Use: Yes    Special: Marijuana, Cocaine     Comment: 30 yrs ago.  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 Not on file   Social History Narrative  . No narrative on file    Review of Systems: As mentioned in HPI.   Physical Exam: Vital signs in last 24 hours: Temp:  [97.8 F (36.6 C)-102.2 F (39 C)] 97.8 F (36.6 C) (06/22 0400) Pulse Rate:  [25-94] 25 (06/22 0700) Resp:  [11-26] 12 (06/22 0700) BP: (102-149)/(33-79) 136/66 mmHg (06/22 0700) SpO2:  [87 %-100 %] 97 % (06/22 0831) Weight:  [280 lb 13.9 oz (127.4 kg)-288 lb (130.636 kg)] 280 lb 13.9 oz (127.4 kg) (06/22 0400) Last BM Date: 01/29/14 General:   Alert, obese, worried affect.  Head:  Normocephalic and atraumatic. Ears:  Normal auditory acuity. Nose:  No deformity, discharge,  or lesions. Mouth:  No deformity or lesions, dentition normal. Lungs:  Clear throughout to auscultation.   No wheezes, crackles, or rhonchi. No acute distress. Heart:  S1 S2 present without murmurs Abdomen:  Soft, diffusely tender lower abdomen, RUQ, obese. No rebound or guarding. Positive bowel sounds. Not able to appreciate tense ascites. Difficult to assess presence of significant ascites due to body habitus.   Rectal:  Deferred  Msk:  Symmetrical without gross deformities. Normal posture. Extremities:  Trace pedal and pretibial edema. Neurologic:  Alert and  oriented x4;  Negative asterixis Skin:  Intact without significant lesions or rashes. Psych:  Alert and cooperative. Normal mood and affect.  Intake/Output from previous day: 06/21 0701 - 06/22 0700 In: 1798.8 [P.O.:660; I.V.:1088.8; IV Piggyback:50] Out: 086 [Urine:875] Intake/Output  this shift:    Lab Results:  Recent Labs  01/29/14 1140 01/30/14 0435  WBC 2.0* 1.2*  HGB 11.1* 9.6*  HCT 31.7* 27.6*  PLT 43* 39*   BMET  Recent Labs  01/29/14 1140 01/30/14 0435  NA 128* 133*  K 4.1 4.1  CL 96 104  CO2 16* 20  GLUCOSE 173* 156*  BUN 14 21  CREATININE 0.91 1.15  CALCIUM 8.4 7.9*   LFT  Recent Labs  01/29/14 1140 01/30/14 0435  PROT 6.7 5.6*  ALBUMIN 2.6* 2.2*  AST 179* 158*  ALT 87* 74*  ALKPHOS 106 88  BILITOT 2.1* 1.8*   PT/INR  Recent Labs  01/30/14 0435  LABPROT 18.3*  INR 1.57*    Studies/Results: Dg Abd Acute W/chest  01/29/2014   CLINICAL DATA:  Ascites, fever, cough, chest tightness, lower abdominal pain, history hypertension, diabetes, cirrhosis, GERD, COPD  EXAM: ACUTE ABDOMEN SERIES (ABDOMEN 2 VIEW & CHEST 1 VIEW)  COMPARISON:  Chest radiograph 01/16/2014, abdominal radiographs 10/24/2013  FINDINGS: Borderline enlargement of cardiac silhouette.  Mediastinal contours and pulmonary vascularity normal.  Lungs clear.  No pleural effusion or pneumothorax.  Suboptimal assessment of bowel gas pattern due to body habitus.  Paucity of bowel gas.  No gross bowel distention or free intraperitoneal air identified on limited assessment.  IMPRESSION: Limited exam showing no definite acute abdominal findings.  Borderline enlargement of cardiac silhouette.   Electronically Signed   By: Lavonia Dana M.D.   On: 01/29/2014 12:29    Impression: 53 year old male with history of Hep C cirrhosis, admitted with abdominal/back pain, fever, chills, with concern for SBP remaining in the differential. MELD 15, Last CT imaging in Dec 2014. Empiric therapy for SBP initiated on admission. Afebrile currently. LFTs around baseline but bump in INR at 1.57. Follow with recheck in am.   Abdominal exam without tense ascites, and weight is overall  stable and actually less than outpatient appointments in the past. Question UTI as possible contributor to symptoms. Agree  with ultrasound and paracentesis with fluid analysis as appropriate. Would recommend CT abd/pelvis today if ultrasound is inconclusive.   Hepatic encephalopathy: improved with lactulose. Stable.   Plan: Ultrasound with diagnostic paracentesis Fluid analysis if able Empiric antibiotics Recommend CT today if ultrasound without significant findings  Lactulose titrated to 2-3 soft BMs daily Follow INR; recheck in am LFTs in am Follow-up with Dr. Patsy Baltimore as outpatient due to chronic [place], ANP-BC [facility]       LOS: 1 day    01/30/2014, 9:27 AM

## 2014-01-31 ENCOUNTER — Ambulatory Visit (HOSPITAL_COMMUNITY): Admission: RE | Admit: 2014-01-31 | Payer: MEDICAID | Source: Ambulatory Visit

## 2014-01-31 LAB — COMPREHENSIVE METABOLIC PANEL
ALK PHOS: 100 U/L (ref 39–117)
ALT: 83 U/L — ABNORMAL HIGH (ref 0–53)
AST: 175 U/L — ABNORMAL HIGH (ref 0–37)
Albumin: 2.6 g/dL — ABNORMAL LOW (ref 3.5–5.2)
BILIRUBIN TOTAL: 2 mg/dL — AB (ref 0.3–1.2)
BUN: 19 mg/dL (ref 6–23)
CO2: 20 meq/L (ref 19–32)
Calcium: 7.9 mg/dL — ABNORMAL LOW (ref 8.4–10.5)
Chloride: 100 mEq/L (ref 96–112)
Creatinine, Ser: 0.91 mg/dL (ref 0.50–1.35)
GLUCOSE: 150 mg/dL — AB (ref 70–99)
POTASSIUM: 4.4 meq/L (ref 3.7–5.3)
Sodium: 131 mEq/L — ABNORMAL LOW (ref 137–147)
TOTAL PROTEIN: 6.5 g/dL (ref 6.0–8.3)

## 2014-01-31 LAB — CBC WITH DIFFERENTIAL/PLATELET
Band Neutrophils: 16 % — ABNORMAL HIGH (ref 0–10)
Basophils Absolute: 0 10*3/uL (ref 0.0–0.1)
Basophils Relative: 1 % (ref 0–1)
Blasts: 0 %
Eosinophils Absolute: 0 10*3/uL (ref 0.0–0.7)
Eosinophils Relative: 1 % (ref 0–5)
HCT: 29.7 % — ABNORMAL LOW (ref 39.0–52.0)
Hemoglobin: 10.5 g/dL — ABNORMAL LOW (ref 13.0–17.0)
LYMPHS ABS: 0.8 10*3/uL (ref 0.7–4.0)
LYMPHS PCT: 41 % (ref 12–46)
MCH: 32.5 pg (ref 26.0–34.0)
MCHC: 35.4 g/dL (ref 30.0–36.0)
MCV: 92 fL (ref 78.0–100.0)
MONO ABS: 0.3 10*3/uL (ref 0.1–1.0)
MONOS PCT: 15 % — AB (ref 3–12)
Metamyelocytes Relative: 0 %
Myelocytes: 2 %
NEUTROS PCT: 24 % — AB (ref 43–77)
NRBC: 0 /100{WBCs}
Neutro Abs: 0.8 10*3/uL — ABNORMAL LOW (ref 1.7–7.7)
PLATELETS: 43 10*3/uL — AB (ref 150–400)
Promyelocytes Absolute: 0 %
RBC: 3.23 MIL/uL — ABNORMAL LOW (ref 4.22–5.81)
RDW: 14.8 % (ref 11.5–15.5)
WBC: 1.9 10*3/uL — AB (ref 4.0–10.5)

## 2014-01-31 LAB — GLUCOSE, CAPILLARY
GLUCOSE-CAPILLARY: 152 mg/dL — AB (ref 70–99)
Glucose-Capillary: 139 mg/dL — ABNORMAL HIGH (ref 70–99)

## 2014-01-31 LAB — AMMONIA: Ammonia: 122 umol/L — ABNORMAL HIGH (ref 11–60)

## 2014-01-31 LAB — PROTIME-INR
INR: 1.25 (ref 0.00–1.49)
Prothrombin Time: 15.4 seconds — ABNORMAL HIGH (ref 11.6–15.2)

## 2014-01-31 MED ORDER — LACTULOSE 10 GM/15ML PO SOLN: 30.0000 g | Freq: Three times a day (TID) | ORAL | Status: DC

## 2014-01-31 MED ORDER — CIPROFLOXACIN HCL 500 MG PO TABS: ORAL_TABLET | ORAL | Status: DC

## 2014-01-31 MED ORDER — IPRATROPIUM-ALBUTEROL 0.5-2.5 (3) MG/3ML IN SOLN: 3.0000 mL | Freq: Two times a day (BID) | RESPIRATORY_TRACT | Status: DC

## 2014-01-31 NOTE — Progress Notes (Signed)
Subjective: Didn't get any rest night due to issues with IV. Sleepy this morning. Still with lower back discomfort but not as bad as it was. Abdominal pain improved. Some discomfort bilateral lower quadrants. Improved. Intermittent. No N/V.   Objective: Vital signs in last 24 hours: Temp:  [98.1 F (36.7 C)-99.6 F (37.6 C)] 98.1 F (36.7 C) (06/23 0557) Pulse Rate:  [61-83] 80 (06/23 0557) Resp:  [8-16] 16 (06/23 0557) BP: (122-154)/(61-78) 122/63 mmHg (06/23 0557) SpO2:  [97 %-100 %] 98 % (06/23 0718) Last BM Date: 01/30/14 General:   Alert and oriented, pleasant Head:  Normocephalic and atraumatic. Abdomen:  Bowel sounds present, soft, obese, TTP RUQ/RLQ/LLQ, no rebound or guarding.  Neurologic:  Alert and  oriented x4;  Negative asterixis Psych:  Alert and cooperative. Normal mood and affect.  Intake/Output from previous day: 06/22 0701 - 06/23 0700 In: 720 [P.O.:720] Out: 800 [Urine:800] Intake/Output this shift:    Lab Results:  Recent Labs  01/29/14 1140 01/30/14 0435 01/31/14 0548  WBC 2.0* 1.2* 1.9*  HGB 11.1* 9.6* 10.5*  HCT 31.7* 27.6* 29.7*  PLT 43* 39* 43*   BMET  Recent Labs  01/29/14 1140 01/30/14 0435 01/31/14 0548  NA 128* 133* 131*  K 4.1 4.1 4.4  CL 96 104 100  CO2 16* 20 20  GLUCOSE 173* 156* 150*  BUN 14 21 19   CREATININE 0.91 1.15 0.91  CALCIUM 8.4 7.9* 7.9*   LFT  Recent Labs  01/29/14 1140 01/30/14 0435 01/31/14 0548  PROT 6.7 5.6* 6.5  ALBUMIN 2.6* 2.2* 2.6*  AST 179* 158* 175*  ALT 87* 74* 83*  ALKPHOS 106 88 100  BILITOT 2.1* 1.8* 2.0*   PT/INR  Recent Labs  01/30/14 0435 01/31/14 0548  LABPROT 18.3* 15.4*  INR 1.57* 1.25    Studies/Results: Mr Lumbar Spine W Wo Contrast  01/30/2014   CLINICAL DATA:  Low back pain.  Bilateral leg weakness.  Fever.  EXAM: MRI LUMBAR SPINE WITHOUT AND WITH CONTRAST  TECHNIQUE: Multiplanar and multiecho pulse sequences of the lumbar spine were obtained without and with  intravenous contrast.  CONTRAST:  69mL MULTIHANCE GADOBENATE DIMEGLUMINE 529 MG/ML IV SOLN  COMPARISON:  CT abdomen and pelvis 07/22/2013  FINDINGS: Normal signal is present in the conus medullaris which terminates at L1, within normal limits. Marrow signal, vertebral body heights, alignment are normal. There slight desiccation of the disc at L4-5. No abnormal fluid signal or enhancement is present to suggest discitis. There is no pathologic enhancement of the thoracolumbar vertebral bodies.  Limited imaging of the abdomen is unremarkable.  L2-3:  Negative.  L3-4:  Negative.  L4-5: Mild lateral disc bulging is present without significant stenosis.  L5-S1: A central disc protrusion is present. There is mild encroachment of the lateral recess bilaterally, worse on the right. Facet hypertrophy is worse on the right. Mild right foraminal narrowing is evident.  S1-2: Asymmetric right-sided facet hypertrophy is present. There is no significant stenosis.  IMPRESSION: 1. L5-S1 disc protrusion with mild lateral recess narrowing, worse on the right. 2. Mild lateral disc bulging at L4-5 without significant stenosis. 3. Transitional S1 anatomy with asymmetric right-sided facet hypertrophy at S1-2 but no significant stenosis.   Electronically Signed   By: Lawrence Santiago M.D.   On: 01/30/2014 11:25   US Abdomen Limited  01/30/2014   CLINICAL DATA:  Fever, ascites  EXAM: US ABDOMEN LIMITED - RIGHT UPPER QUADRANT  COMPARISON:  CT 01/16/2014  FINDINGS: Survey ultrasound of  the abdomen demonstrates minimal ascites. No fluid collection large enough for paracentesis.  IMPRESSION:  Minimal ascites.  No drainable volume   Electronically Signed   By: Suzy Bouchard M.D.   On: 01/30/2014 12:24   Dg Abd Acute W/chest  01/29/2014   CLINICAL DATA:  Ascites, fever, cough, chest tightness, lower abdominal pain, history hypertension, diabetes, cirrhosis, GERD, COPD  EXAM: ACUTE ABDOMEN SERIES (ABDOMEN 2 VIEW & CHEST 1 VIEW)  COMPARISON:   Chest radiograph 01/16/2014, abdominal radiographs 10/24/2013  FINDINGS: Borderline enlargement of cardiac silhouette.  Mediastinal contours and pulmonary vascularity normal.  Lungs clear.  No pleural effusion or pneumothorax.  Suboptimal assessment of bowel gas pattern due to body habitus.  Paucity of bowel gas.  No gross bowel distention or free intraperitoneal air identified on limited assessment.  IMPRESSION: Limited exam showing no definite acute abdominal findings.  Borderline enlargement of cardiac silhouette.   Electronically Signed   By: Lavonia Dana M.D.   On: 01/29/2014 12:29    Assessment: 53 year old male with history of Hep C cirrhosis, admitted with abdominal/back pain, fever, chills, with concern for SBP remaining in the differential; however, [place] without any significant ascites to drain for diagnostic purposes. MELD 15, Last CT imaging in Dec 2014. Empiric therapy for SBP initiated on admission, as unable to exclude SBP completely. Afebrile currently. LFTs around baseline with normalized INR. Clinically, patient improved with empiric abx therapy. Will hold off on CT unless recurrent pain, fever, or other concerning signs. Discharge home in near future if he remains stable.   Hepatic encephalopathy: improved with lactulose. Stable. Titrate to 2-3 soft BMs daily.   Plan: CT if recurrent/worsening abdominal pain, otherwise hold off on abdominal imaging Complete course of empiric antibiotic therapy; needs outpatient prophylaxis for SBP Lactulose titrated to 2-3 soft BMs daily Follow-up with Dr. Patsy Baltimore as outpatient due to chronic [place], ANP-BC [facility]    LOS: 2 days    01/31/2014, 7:49 AM   Attending note:  Patient seen and examined this afternoon. Well oriented and has no asterixis by my examination. Ammonia remains elevated. Will go along with the prior recommendations as far as treatment for presumptive SBP concerned.  Patients responding to  antibiotics can be transitioned to oral therapy sooner than 5 days. Since she's had about 3 days of IV antibiotics, could go ahead and transitioned to Cipro 500 mg twice daily for 5 more days and then drop back to Cipro 500 mg once daily as possible secondary prophylaxis. He is seeing his hepatologist next month. I would let them ultimately decide whether or not he will be committed to indefinite prophylaxis. Discussed at length with Dr. Roderic Palau, patient and spouse.

## 2014-01-31 NOTE — Discharge Summary (Signed)
Physician Discharge Summary  [name] KWI:[id number] DOB: 1960-12-19 DOA: 01/29/2014  PCP: Jacqualine Mau, PA-C  Admit date: 01/29/2014 Discharge date: 01/31/2014  Time spent: 45 minutes  Recommendations for Outpatient Follow-up:  1. Followup with gastroenterologist in [place] for further treatment of cirrhosis. We will also need to determine if SBP prophylaxis is required long-term. 2. Followup with [name] in 2-3 weeks  Discharge Diagnoses:  Active Problems:   HEPATITIS C, CHRONIC   DM   Other pancytopenia   Cirrhosis   Hyponatremia   Fever, unspecified   SBP (spontaneous bacterial peritonitis)   Sepsis   Encephalopathy, hepatic   Discharge Condition: Improved  Diet recommendation: Low salt  Filed Weights   01/29/14 1119 01/29/14 1447 01/30/14 0400  Weight: 130.636 kg (288 lb) 127.7 kg (281 lb 8.4 oz) 127.4 kg (280 lb 13.9 oz)    History of present illness:  [name] is a 53 y.o. male with a history of hepatitis C cirrhosis, presents to the emergency room with complaints of progressive back pain, abdominal pain and generalized weakness and dizziness. Patient reports onset of symptoms approximately one week ago. He had progressive weakness, lightheadedness and was "stumbling around". He's been increasingly fatigued and feels like sleeping all the time. He's also had recurrent chills, but he has not checked his temperature. He's also feels that his abdominal distention is increasing. He's having difficulty putting on his pants. He's had progressive abdominal pain as well as back pain. He feels short of breath, worse when lying down. Denies any nausea, vomiting or diarrhea. Has not had any significant cough. Reports that stools maybe black. He came to the emergency room and was found to be febrile with a temperature of 102. Vitals were otherwise stable. Blood work reveals a pancytopenia which appears chronic. There was concern for underlying sepsis therefore he  will be admitted for further workup   Hospital Course:  This patient was admitted to the hospital with fever, back pain or abdominal pain. It was found that he did meet criteria for sepsis, although etiology was not entirely clear. His urinalysis was not convincing, urine culture growing 60,000 staph aureus. Chest x-ray-any significant pneumonia. MRI of his lumbar spine did not indicate any discitis or abscess. It was felt that he may have spontaneous bacterial peritonitis and was started on antibiotics. He was evaluated by ultrasound and it was not felt that he had a significant amount of fluid to aspirate. In any case, patient was treated for presumed spontaneous bacterial peritonitis. Case was discussed with gastroenterology and recommendations were to transition the patient from Rocephin to ciprofloxacin 500 mg twice a day for 5 more days followed by once daily for SBP prophylaxis. Patient followup in [place] with a gastroenterologist to determine further treatments. He is being evaluated for liver transplantation. Patient is doing significantly improved at this time. He was started on lactulose for elevated ammonia levels. If his levels still remain elevated, but the patient has clinically improved. His lactulose will be need to be further titrated for 3-4 soft bowel movements every day. Patient is very adamant about being discharged home today. He reports that his back pain has resolved and his abdominal pain is significantly improved.  Procedures:    Consultations:  Gastroenterology  Discharge Exam: Filed Vitals:   01/31/14 0557  BP: 122/63  Pulse: 80  Temp: 98.1 F (36.7 C)  Resp: 16    General: No acute distress Cardiovascular: S1, S2, regular rate and rhythm Respiratory: Clear  to auscultation bilaterally  Discharge Instructions You were cared for by a hospitalist during your hospital stay. If you have any questions about your discharge medications or the care you received  while you were in the hospital after you are discharged, you can call the unit and asked to speak with the hospitalist on call if the hospitalist that took care of you is not available. Once you are discharged, your primary care physician will handle any further medical issues. Please note that NO REFILLS for any discharge medications will be authorized once you are discharged, as it is imperative that you return to your primary care physician (or establish a relationship with a primary care physician if you do not have one) for your aftercare needs so that they can reassess your need for medications and monitor your lab values.  Discharge Instructions   Call MD for:  persistant nausea and vomiting    Complete by:  As directed      Call MD for:  severe uncontrolled pain    Complete by:  As directed      Call MD for:  temperature >100.4    Complete by:  As directed      Diet - low sodium heart healthy    Complete by:  As directed      Increase activity slowly    Complete by:  As directed             Medication List         acetaminophen 500 MG tablet  Commonly known as:  TYLENOL  Take 1,000 mg by mouth every 6 (six) hours as needed for mild pain.     albuterol 108 (90 BASE) MCG/ACT inhaler  Commonly known as:  PROVENTIL HFA;VENTOLIN HFA  Inhale 2 puffs into the lungs every 6 (six) hours as needed. Shortness of breath     ciprofloxacin 500 MG tablet  Commonly known as:  CIPRO  Take 1 tab bid for 5 days then 1 tab daily     esomeprazole 40 MG capsule  Commonly known as:  NEXIUM  Take 1 capsule (40 mg total) by mouth 2 (two) times daily before a meal.     glipiZIDE 10 MG tablet  Commonly known as:  GLUCOTROL  Take 10 mg by mouth daily.     insulin glargine 100 UNIT/ML injection  Commonly known as:  LANTUS  Inject 10 Units into the skin at bedtime.     lactulose 10 GM/15ML solution  Commonly known as:  CHRONULAC  Take 45 mLs (30 g total) by mouth 3 (three) times daily.        No Known Allergies     Follow-up Information   Follow up with Barney Drain, MD. Schedule an appointment as soon as possible for a visit in 2 weeks.   Specialty:  Gastroenterology   Contact information:   [facility] [address] [place] [address] [phone]        The results of significant diagnostics from this hospitalization (including imaging, microbiology, ancillary and laboratory) are listed below for reference.    Significant Diagnostic Studies: Dg Chest 2 View  01/16/2014   CLINICAL DATA:  Shortness of breath, cough, symptoms for 2-3 days, history hypertension, diabetes, cirrhosis, hepatitis, COPD, GERD  EXAM: CHEST  2 VIEW  COMPARISON:  10/24/2013  FINDINGS: Borderline enlargement of cardiac silhouette.  Mediastinal contours and pulmonary vascularity normal.  Slight chronic accentuation of RIGHT basilar markings likely related to  chronic low inspiratory lung volumes.  No definite acute infiltrate, pleural effusion or pneumothorax.  Minor scattered endplate spur formation thoracic spine.  IMPRESSION: No acute abnormalities.  Borderline enlargement of cardiac silhouette.   Electronically Signed   By: Lavonia Dana M.D.   On: 01/16/2014 15:10   Ct Angio Chest Pe W/cm &/or Wo Cm  01/16/2014   CLINICAL DATA:  Shortness breast discuss that shortness of breath. Evaluate for pulmonary embolism. History of cirrhosis.  EXAM: CT ANGIOGRAPHY CHEST WITH CONTRAST  TECHNIQUE: Multidetector CT imaging of the chest was performed using the standard protocol during bolus administration of intravenous contrast. Multiplanar CT image reconstructions and MIPs were obtained to evaluate the vascular anatomy.  CONTRAST:  166mL OMNIPAQUE IOHEXOL 350 MG/ML SOLN  COMPARISON:  Chest radiograph 01/16/2014.  FINDINGS: There is bilateral moderate gynecomastia.  Heart is mildly enlarged.  Thoracic aorta is normal in caliber enhancement. No evidence of atherosclerosis.  Evaluation of the  pulmonary arterial tree is slightly limited due to patient respiratory motion at the lung bases. No filling defects are identified within the pulmonary arterial tree.  Negative for pleural or pericardial effusion. Negative for lymphadenopathy.  There is a cluster of blebs in the right middle lobe. Two subcentimeter calcified granulomas are noted in the right middle lobe. No airspace disease, pulmonary mass, or interstitial abnormality is identified. The trachea and mainstem bronchi are patent.  Thoracic spine vertebral bodies are normal in height and alignment. Contiguous anterior osteophytes are present in the thoracic spine. No suspicious osseous abnormality. Negative for fracture.  Imaging of the upper abdomen demonstrates hepatic cirrhosis, ascites, and splenomegaly.  Review of the MIP images confirms the above findings.  IMPRESSION: 1. No evidence of pulmonary embolism. Slight limitation due to patient respiratory motion affecting the lung bases. 2. No acute findings in the lungs. 3. Hepatic cirrhosis, upper abdominal ascites, and splenomegaly. 4. Mild cardiomegaly. 5. Cluster of blebs in the right middle lobe.   Electronically Signed   By: Curlene Dolphin M.D.   On: 01/16/2014 17:27   Mr Lumbar Spine W Wo Contrast  01/30/2014   CLINICAL DATA:  Low back pain.  Bilateral leg weakness.  Fever.  EXAM: MRI LUMBAR SPINE WITHOUT AND WITH CONTRAST  TECHNIQUE: Multiplanar and multiecho pulse sequences of the lumbar spine were obtained without and with intravenous contrast.  CONTRAST:  57mL MULTIHANCE GADOBENATE DIMEGLUMINE 529 MG/ML IV SOLN  COMPARISON:  CT abdomen and pelvis 07/22/2013  FINDINGS: Normal signal is present in the conus medullaris which terminates at L1, within normal limits. Marrow signal, vertebral body heights, alignment are normal. There slight desiccation of the disc at L4-5. No abnormal fluid signal or enhancement is present to suggest discitis. There is no pathologic enhancement of the  thoracolumbar vertebral bodies.  Limited imaging of the abdomen is unremarkable.  L2-3:  Negative.  L3-4:  Negative.  L4-5: Mild lateral disc bulging is present without significant stenosis.  L5-S1: A central disc protrusion is present. There is mild encroachment of the lateral recess bilaterally, worse on the right. Facet hypertrophy is worse on the right. Mild right foraminal narrowing is evident.  S1-2: Asymmetric right-sided facet hypertrophy is present. There is no significant stenosis.  IMPRESSION: 1. L5-S1 disc protrusion with mild lateral recess narrowing, worse on the right. 2. Mild lateral disc bulging at L4-5 without significant stenosis. 3. Transitional S1 anatomy with asymmetric right-sided facet hypertrophy at S1-2 but no significant stenosis.   Electronically Signed   By: Bretta Bang.D.  On: 01/30/2014 11:25   US Abdomen Limited  01/30/2014   CLINICAL DATA:  Fever, ascites  EXAM: US ABDOMEN LIMITED - RIGHT UPPER QUADRANT  COMPARISON:  CT 01/16/2014  FINDINGS: Survey ultrasound of the abdomen demonstrates minimal ascites. No fluid collection large enough for paracentesis.  IMPRESSION:  Minimal ascites.  No drainable volume   Electronically Signed   By: Suzy Bouchard M.D.   On: 01/30/2014 12:24   Dg Abd Acute W/chest  01/29/2014   CLINICAL DATA:  Ascites, fever, cough, chest tightness, lower abdominal pain, history hypertension, diabetes, cirrhosis, GERD, COPD  EXAM: ACUTE ABDOMEN SERIES (ABDOMEN 2 VIEW & CHEST 1 VIEW)  COMPARISON:  Chest radiograph 01/16/2014, abdominal radiographs 10/24/2013  FINDINGS: Borderline enlargement of cardiac silhouette.  Mediastinal contours and pulmonary vascularity normal.  Lungs clear.  No pleural effusion or pneumothorax.  Suboptimal assessment of bowel gas pattern due to body habitus.  Paucity of bowel gas.  No gross bowel distention or free intraperitoneal air identified on limited assessment.  IMPRESSION: Limited exam showing no definite acute  abdominal findings.  Borderline enlargement of cardiac silhouette.   Electronically Signed   By: Lavonia Dana M.D.   On: 01/29/2014 12:29    Microbiology: Recent Results (from the past 240 hour(s))  URINE CULTURE     Status: None   Collection Time    01/29/14 12:26 PM      Result Value Ref Range Status   Specimen Description URINE, CLEAN CATCH   Final   Special Requests IMMUNE:COMPROMISED   Final   Culture  Setup Time     Final   Value: 01/29/2014 22:54     Performed at [facility]     Final   Value: 60,000 COLONIES/ML     Performed at [facility]   Culture     Final   Value: STAPHYLOCOCCUS AUREUS     Note: RIFAMPIN AND GENTAMICIN SHOULD NOT BE USED AS SINGLE DRUGS FOR TREATMENT OF STAPH INFECTIONS.     Performed at [facility]   Report Status PENDING   Incomplete  MRSA PCR SCREENING     Status: None   Collection Time    01/29/14  2:30 PM      Result Value Ref Range Status   MRSA by PCR NEGATIVE  NEGATIVE Final   Comment:            The GeneXpert MRSA Assay (FDA     approved for NASAL specimens     only), is one component of a     comprehensive MRSA colonization     surveillance program. It is not     intended to diagnose MRSA     infection nor to guide or     monitor treatment for     MRSA infections.  CULTURE, BLOOD (ROUTINE X 2)     Status: None   Collection Time    01/29/14  4:23 PM      Result Value Ref Range Status   Specimen Description BLOOD LEFT HAND   Final   Special Requests BOTTLES DRAWN AEROBIC AND ANAEROBIC 6CC   Final   Culture NO GROWTH 2 DAYS   Final   Report Status PENDING   Incomplete  CULTURE, BLOOD (ROUTINE X 2)     Status: None   Collection Time    01/29/14  4:23 PM      Result Value Ref Range Status   Specimen Description BLOOD RIGHT ANTECUBITAL  Final   Special Requests BOTTLES DRAWN AEROBIC AND ANAEROBIC 8CC   Final   Culture NO GROWTH 2 DAYS   Final   Report Status PENDING   Incomplete      Labs: Basic Metabolic Panel:  Recent Labs Lab 01/29/14 1140 01/30/14 0435 01/31/14 0548  NA 128* 133* 131*  K 4.1 4.1 4.4  CL 96 104 100  CO2 16* 20 20  GLUCOSE 173* 156* 150*  BUN 14 21 19   CREATININE 0.91 1.15 0.91  CALCIUM 8.4 7.9* 7.9*   Liver Function Tests:  Recent Labs Lab 01/29/14 1140 01/30/14 0435 01/31/14 0548  AST 179* 158* 175*  ALT 87* 74* 83*  ALKPHOS 106 88 100  BILITOT 2.1* 1.8* 2.0*  PROT 6.7 5.6* 6.5  ALBUMIN 2.6* 2.2* 2.6*    Recent Labs Lab 01/29/14 1140  LIPASE 60*    Recent Labs Lab 01/29/14 1140 01/30/14 0435 01/31/14 1140  AMMONIA 89* 73* 122*   CBC:  Recent Labs Lab 01/29/14 1140 01/30/14 0435 01/31/14 0548  WBC 2.0* 1.2* 1.9*  NEUTROABS 1.2*  --  0.8*  HGB 11.1* 9.6* 10.5*  HCT 31.7* 27.6* 29.7*  MCV 92.2 92.6 92.0  PLT 43* 39* 43*   Cardiac Enzymes:  Recent Labs Lab 01/29/14 1140  TROPONINI <0.30   BNP: BNP (last 3 results)  Recent Labs  10/24/13 2149 01/16/14 1450  PROBNP 374.8* 153.4*   CBG:  Recent Labs Lab 01/30/14 1200 01/30/14 1652 01/30/14 2115 01/31/14 0733 01/31/14 1129  GLUCAP 109* 135* 136* 139* 152*       Signed:  MEMON,JEHANZEB  [facility] 01/31/2014, 6:24 PM

## 2014-01-31 NOTE — Discharge Instructions (Signed)
Sepsis °Sepsis is a serious infection of your blood or tissues that affects your whole body. The infection that causes sepsis may be bacterial, viral, fungal, or parasitic. Sepsis may be life threatening. Sepsis can cause your blood pressure to drop. This may result in shock. Shock causes your central nervous system and your organs to stop working correctly.  °RISK FACTORS °Sepsis can happen in anyone, but it is more likely to happen in people who have weakened immune systems. °SIGNS AND SYMPTOMS  °Symptoms of sepsis can include: °· Fever or low body temperature (hypothermia). °· Rapid breathing (hyperventilation). °· Chills. °· Rapid heartbeat (tachycardia). °· Confusion or light-headedness. °· Trouble breathing. °· Urinating much less than usual. °· Cool, clammy skin or red, flushed skin. °· Other problems with the heart, kidneys, or brain. °DIAGNOSIS  °Your health care provider will likely do tests to look for an infection, to see if the infection has spread to your blood, and to see how serious your condition is. Tests can include: °· Blood tests, including cultures of your blood. °· Cultures of other fluids from your body, such as: °¨ Urine. °¨ Pus from wounds. °¨ Mucus coughed up from your lungs. °· Urine tests other than cultures. °· X-ray exams or other imaging tests. °TREATMENT  °Treatment will begin with elimination of the source of infection. If your sepsis is likely caused by a bacterial or fungal infection, you will be given antibiotic or antifungal medicines. °You may also receive: °· Oxygen. °· Fluids through an IV tube. °· Medicines to increase your blood pressure. °· A machine to clean your blood (dialysis) if your kidneys fail. °· A machine to help you breathe if your lungs fail. °SEEK IMMEDIATE MEDICAL CARE IF: °You get an infection or develop any of the signs and symptoms of sepsis after surgery or a hospitalization. °Document Released: 04/26/2003 Document Revised: 08/02/2013 Document Reviewed:  04/04/2013 °ExitCare® Patient Information ©2015 [employer]. This information is not intended to replace advice given to you by your health care provider. Make sure you discuss any questions you have with your health care provider. ° °

## 2014-01-31 NOTE — Care Management Note (Signed)
    Page 1 of 1   01/31/2014     1:41:31 PM CARE MANAGEMENT NOTE 01/31/2014  Patient:  [name], [name]   Account Number:  [id number]  Date Initiated:  01/31/2014  Documentation initiated by:  Claretha Cooper  Subjective/Objective Assessment:   pt admitted from home where he lives with his wife. Very anxious to be DC'd today. Notified Dr. Roderic Palau     Action/Plan:   Anticipated DC Date:  02/01/2014   Anticipated DC Plan:  [place]  CM consult      Choice offered to / List presented to:             Status of service:  Completed, signed off Medicare Important Message given?   (If response is "NO", the following Medicare IM given date fields will be blank) Date Medicare IM given:   Date Additional Medicare IM given:    Discharge Disposition:    Per UR Regulation:    If discussed at Long Length of Stay Meetings, dates discussed:    Comments:  01/31/14 Claretha Cooper RN CM

## 2014-01-31 NOTE — Discharge Planning (Signed)
Pt stated he was ready to be DCd and his pain was under control.  Pt's IV and tele were removed and pt was given DC papers and education with family in room. Pt educated to not drive till feeling much more alert and well or while taking pain meds.  Pt told of FU appointments suggested and also given scripts.  Also instructed when to call doctors office or return to the hospital.  Pt wheeled to car by myself and family.

## 2014-02-01 ENCOUNTER — Other Ambulatory Visit (HOSPITAL_COMMUNITY): Payer: Self-pay | Admitting: Hematology and Oncology

## 2014-02-01 LAB — URINE CULTURE: Colony Count: 60000

## 2014-02-01 MED ORDER — OXYCODONE HCL 10 MG PO TABS: ORAL_TABLET | ORAL | Status: DC

## 2014-02-03 LAB — CULTURE, BLOOD (ROUTINE X 2)
Culture: NO GROWTH
Culture: NO GROWTH

## 2014-02-08 DIAGNOSIS — K828 Other specified diseases of gallbladder: Secondary | ICD-10-CM

## 2014-02-08 HISTORY — DX: Other specified diseases of gallbladder: K82.8

## 2014-02-13 ENCOUNTER — Other Ambulatory Visit (HOSPITAL_COMMUNITY): Payer: Self-pay | Admitting: Hematology and Oncology

## 2014-02-13 MED ORDER — OXYCODONE HCL 10 MG PO TABS: ORAL_TABLET | ORAL | Status: DC

## 2014-02-21 ENCOUNTER — Encounter (HOSPITAL_COMMUNITY): Payer: Medicare Other | Attending: Hematology and Oncology

## 2014-02-21 ENCOUNTER — Encounter (HOSPITAL_COMMUNITY): Payer: Self-pay

## 2014-02-21 VITALS — BP 145/69 | HR 80 | Temp 98.0°F | Resp 20 | Wt 280.4 lb

## 2014-02-21 DIAGNOSIS — D731 Hypersplenism: Secondary | ICD-10-CM | POA: Insufficient documentation

## 2014-02-21 DIAGNOSIS — K7460 Unspecified cirrhosis of liver: Secondary | ICD-10-CM | POA: Insufficient documentation

## 2014-02-21 DIAGNOSIS — Z79899 Other long term (current) drug therapy: Secondary | ICD-10-CM | POA: Diagnosis not present

## 2014-02-21 DIAGNOSIS — K5520 Angiodysplasia of colon without hemorrhage: Secondary | ICD-10-CM | POA: Insufficient documentation

## 2014-02-21 DIAGNOSIS — K219 Gastro-esophageal reflux disease without esophagitis: Secondary | ICD-10-CM | POA: Insufficient documentation

## 2014-02-21 DIAGNOSIS — R1011 Right upper quadrant pain: Secondary | ICD-10-CM | POA: Diagnosis not present

## 2014-02-21 DIAGNOSIS — D509 Iron deficiency anemia, unspecified: Secondary | ICD-10-CM | POA: Insufficient documentation

## 2014-02-21 DIAGNOSIS — Z794 Long term (current) use of insulin: Secondary | ICD-10-CM | POA: Diagnosis not present

## 2014-02-21 DIAGNOSIS — D61818 Other pancytopenia: Secondary | ICD-10-CM

## 2014-02-21 DIAGNOSIS — B1920 Unspecified viral hepatitis C without hepatic coma: Secondary | ICD-10-CM | POA: Insufficient documentation

## 2014-02-21 DIAGNOSIS — J4489 Other specified chronic obstructive pulmonary disease: Secondary | ICD-10-CM | POA: Insufficient documentation

## 2014-02-21 DIAGNOSIS — J449 Chronic obstructive pulmonary disease, unspecified: Secondary | ICD-10-CM | POA: Diagnosis not present

## 2014-02-21 DIAGNOSIS — R109 Unspecified abdominal pain: Secondary | ICD-10-CM

## 2014-02-21 DIAGNOSIS — E119 Type 2 diabetes mellitus without complications: Secondary | ICD-10-CM | POA: Insufficient documentation

## 2014-02-21 DIAGNOSIS — Z862 Personal history of diseases of the blood and blood-forming organs and certain disorders involving the immune mechanism: Secondary | ICD-10-CM

## 2014-02-21 LAB — CBC WITH DIFFERENTIAL/PLATELET
Basophils Absolute: 0 10*3/uL (ref 0.0–0.1)
Basophils Relative: 0 % (ref 0–1)
Eosinophils Absolute: 0.1 10*3/uL (ref 0.0–0.7)
Eosinophils Relative: 2 % (ref 0–5)
HCT: 32.4 % — ABNORMAL LOW (ref 39.0–52.0)
Hemoglobin: 11.1 g/dL — ABNORMAL LOW (ref 13.0–17.0)
LYMPHS ABS: 1.7 10*3/uL (ref 0.7–4.0)
Lymphocytes Relative: 52 % — ABNORMAL HIGH (ref 12–46)
MCH: 31.7 pg (ref 26.0–34.0)
MCHC: 34.3 g/dL (ref 30.0–36.0)
MCV: 92.6 fL (ref 78.0–100.0)
Monocytes Absolute: 0.3 10*3/uL (ref 0.1–1.0)
Monocytes Relative: 10 % (ref 3–12)
NEUTROS PCT: 38 % — AB (ref 43–77)
Neutro Abs: 1.2 10*3/uL — ABNORMAL LOW (ref 1.7–7.7)
PLATELETS: 51 10*3/uL — AB (ref 150–400)
RBC: 3.5 MIL/uL — AB (ref 4.22–5.81)
RDW: 14 % (ref 11.5–15.5)
WBC: 3.3 10*3/uL — AB (ref 4.0–10.5)

## 2014-02-21 LAB — COMPREHENSIVE METABOLIC PANEL
ALBUMIN: 2.8 g/dL — AB (ref 3.5–5.2)
ALK PHOS: 128 U/L — AB (ref 39–117)
ALT: 81 U/L — AB (ref 0–53)
AST: 176 U/L — AB (ref 0–37)
Anion gap: 9 (ref 5–15)
BUN: 12 mg/dL (ref 6–23)
CO2: 23 mEq/L (ref 19–32)
Calcium: 8.4 mg/dL (ref 8.4–10.5)
Chloride: 108 mEq/L (ref 96–112)
Creatinine, Ser: 0.76 mg/dL (ref 0.50–1.35)
GFR calc Af Amer: >90 mL/min (ref >90)
Glucose, Bld: 241 mg/dL — ABNORMAL HIGH (ref 70–99)
POTASSIUM: 4.4 meq/L (ref 3.7–5.3)
Sodium: 140 mEq/L (ref 137–147)
Total Bilirubin: 2 mg/dL — ABNORMAL HIGH (ref 0.3–1.2)
Total Protein: 6.6 g/dL (ref 6.0–8.3)

## 2014-02-21 LAB — URINALYSIS, ROUTINE W REFLEX MICROSCOPIC
BILIRUBIN URINE: NEGATIVE
GLUCOSE, UA: NEGATIVE mg/dL
KETONES UR: NEGATIVE mg/dL
Leukocytes, UA: NEGATIVE
NITRITE: NEGATIVE
PH: 6 (ref 5.0–8.0)
Protein, ur: NEGATIVE mg/dL
SPECIFIC GRAVITY, URINE: 1.01 (ref 1.005–1.030)
Urobilinogen, UA: 0.2 mg/dL (ref 0.0–1.0)

## 2014-02-21 LAB — URINE MICROSCOPIC-ADD ON

## 2014-02-21 LAB — FERRITIN: FERRITIN: 48 ng/mL (ref 22–322)

## 2014-02-21 LAB — AMMONIA: Ammonia: 87 umol/L — ABNORMAL HIGH (ref 11–60)

## 2014-02-21 MED ORDER — OXYCODONE HCL 10 MG PO TABS: ORAL_TABLET | ORAL | Status: DC

## 2014-02-21 NOTE — Progress Notes (Signed)
[name]  OFFICE PROGRESS NOTE  [address], [place] [address]  DIAGNOSIS: Cirrhosis of liver without mention of alcohol - Plan: CBC with Differential, Comprehensive metabolic panel, Ammonia, CBC with Differential, Ferritin  Hypersplenism - Plan: CBC with Differential, Ferritin  Iron (Fe) deficiency anemia - Plan: Ferritin, Soluble transferrin receptor, CBC with Differential, Ferritin  Angiodysplasia of colon - Plan: CBC with Differential, Ferritin  Flank pain - Plan: Urinalysis, Routine w reflex microscopic, Urinalysis, Routine w reflex microscopic, CBC with Differential, Ferritin  No chief complaint on file.   CURRENT THERAPY: IV Feraheme 10/10/2013  INTERVAL HISTORY: [name] 53 y.o. male returns for followup of pancytopenia secondary to cirrhosis of the liver with iron deficiency, status post IV Feraheme 10/10/2013. His cirrhosis of non-alcohol-related and he has been evaluated in [place], [place] for possible transplant. There is a history of left nephrolithiasis as well as cholelithiasis. Due to his severe underlying liver disease, cholecystectomy has not been done. Last hemoglobin on 01/31/2014 was 10.5; last ferritin on 12/27/2013 was 111. He developed right flank pain he did have dark urine 3 or 4 days ago. Pain was excruciating at that time but no longer is as severe. His abdominal distention with lower 70 swelling but no redness. He denies any chest pain, PND, orthopnea, or palpitations. He was evaluated in [place], [place] and was told that he was not a good candidate for liver transplant before treating his hepatitis C and cirrhosis with Harvoni. He needs approximately 5 oxycodone 10 mg per day in order to control her upper quadrant pain. He denies any fever, night sweats, nausea, vomiting, melena, hematochezia, or hematuria.  MEDICAL  HISTORY: Past Medical History  Diagnosis Date  . Hypertension   . Diabetes mellitus   . GERD (gastroesophageal reflux disease)     DEC 2010 EGD/Bx REACTIVE GASTROPATHY, NO VARICES  . Hemorrhoids, internal   . BMI 40.0-44.9, adult OCT 2010 269 LBS    APR 2012 279 LBS AUG 2014 185 LBS  . 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 IV drug abuse REMOTE  . Hepatitis 2010 HEP C    AST 509 ALT 267 ALK PHOS 165 ALB 3.8 NEG IGM HAV/HBSAg  . Gallstone AUG 2012 1 CM  . GERD 10/25/2009  . COPD (chronic obstructive pulmonary disease)   . Hepatitis C   . Pancytopenia 2013  . Splenomegaly 2013  . Other pancytopenia 02/18/2013  . Iron (Fe) deficiency anemia 02/18/2013  . Splenomegaly 02/18/2013  . Neuropathy     INTERIM HISTORY: has HEPATITIS C, CHRONIC; DM; ALCOHOL ABUSE; GERD; CIRRHOSIS; UNSPECIFIED DISEASE OF PANCREAS; FATIGUE; LIVER FUNCTION TESTS, ABNORMAL, HX OF; ABDOMINAL PAIN, CHRONIC; Gallstone; Colon cancer screening; Other pancytopenia; Iron (Fe) deficiency anemia; Splenomegaly; Right upper quadrant abdominal pain; Biliary colic; Cholecystitis, acute with cholelithiasis; Dyspnea; Hematochezia; Angiodysplasia of colon; Cirrhosis; Hyponatremia; Fever, unspecified; SBP (spontaneous bacterial peritonitis); Sepsis; and Encephalopathy, hepatic on his problem list.    ALLERGIES:  has No Known Allergies.  MEDICATIONS: has a current medication list which includes the following prescription(s): acetaminophen, albuterol, ciprofloxacin, esomeprazole, glipizide, insulin glargine, lactulose, oxycodone hcl, and UNKNOWN TO PATIENT.  SURGICAL HISTORY:  Past Surgical History  Procedure Laterality Date  . Sigmoidoscopy      2001 DR. FLEISCHMAN INTERNAL HERMORRHOIDS  . Upper gastrointestinal endoscopy  DEC 2010    BENIGN POLYPS, GASTRITIS, ?  phg  . Knee surgery  RIGHT  . Hemorrhoid surgery    . Esophageal biopsy  09/08/2011    Dr. Darrick Penna:Moderate gastritis/Polyps, multiple in the body of the  stomach  . Colonoscopy with propofol N/A 11/15/2013    Dr. Darrick Penna: rectal varices, small AVMs  . Esophagogastroduodenoscopy (egd) with propofol N/A 11/15/2013    Dr. Darrick Penna: Grade 1 varices in distal esophagus, large polyp at the pylorus, moderate nodular gastritis. Next EGD in April 2016.      FAMILY HISTORY: family history includes Cancer in his father. There is no history of Colon cancer, Anesthesia problems, Hypotension, Malignant hyperthermia, or Pseudochol deficiency.  SOCIAL HISTORY:  reports that he has never smoked. He has never used smokeless tobacco. He reports that he uses illicit drugs (Marijuana and Cocaine). He reports that he does not drink alcohol.  REVIEW OF SYSTEMS:  Other than that discussed above is noncontributory.  PHYSICAL EXAMINATION: ECOG PERFORMANCE STATUS: 1 - Symptomatic but completely ambulatory  Blood pressure 145/69, pulse 80, temperature 98 F (36.7 C), temperature source Oral, resp. rate 20, weight 280 lb 6.4 oz (127.189 kg), SpO2 98.00%.  GENERAL:alert, no distress and comfortable SKIN: skin color, texture, turgor are normal, no rashes or significant lesions EYES: PERLA; Conjunctiva are pink and non-injected, sclera clear SINUSES: No redness or tenderness over maxillary or ethmoid sinuses OROPHARYNX:no exudate, no erythema on lips, buccal mucosa, or tongue. NECK: supple, thyroid normal size, non-tender, without nodularity. No masses CHEST: Increased AP diameter with bilateral gynecomastia and evidence of spider angiomata. LYMPH:  no palpable lymphadenopathy in the cervical, axillary or inguinal LUNGS: clear to auscultation and percussion with normal breathing effort HEART: regular rate & rhythm and no murmurs. ABDOMEN: Distended with a positive fluid wave and shifting dullness. Right flank tenderness and right upper quadrant tenderness as well. Spleen is ballotable approximately 8 cm below left costal margin. MUSCULOSKELETAL:no cyanosis of digits and no  clubbing. Range of motion normal.  NEURO: alert & oriented x 3 with fluent speech, no focal motor/sensory deficits. No asterixis.   LABORATORY DATA: Office Visit on 02/21/2014  Component Date Value Ref Range Status  . Color, Urine 02/21/2014 YELLOW  YELLOW Final  . APPearance 02/21/2014 CLEAR  CLEAR Final  . Specific Gravity, Urine 02/21/2014 1.010  1.005 - 1.030 Final  . pH 02/21/2014 6.0  5.0 - 8.0 Final  . Glucose, UA 02/21/2014 NEGATIVE  NEGATIVE mg/dL Final  . Hgb urine dipstick 02/21/2014 TRACE* NEGATIVE Final  . Bilirubin Urine 02/21/2014 NEGATIVE  NEGATIVE Final  . Ketones, ur 02/21/2014 NEGATIVE  NEGATIVE mg/dL Final  . Protein, ur 13/62/5226 NEGATIVE  NEGATIVE mg/dL Final  . Urobilinogen, UA 02/21/2014 0.2  0.0 - 1.0 mg/dL Final  . Nitrite 16/74/6282 NEGATIVE  NEGATIVE Final  . Leukocytes, UA 02/21/2014 NEGATIVE  NEGATIVE Final  . RBC / HPF 02/21/2014 0-2  <3 RBC/hpf Final  Lab on 02/21/2014  Component Date Value Ref Range Status  . WBC 02/21/2014 3.3* 4.0 - 10.5 K/uL Final  . RBC 02/21/2014 3.50* 4.22 - 5.81 MIL/uL Final  . Hemoglobin 02/21/2014 11.1* 13.0 - 17.0 g/dL Final  . HCT 86/68/9375 32.4* 39.0 - 52.0 % Final  . MCV 02/21/2014 92.6  78.0 - 100.0 fL Final  . MCH 02/21/2014 31.7  26.0 - 34.0 pg Final  . MCHC 02/21/2014 34.3  30.0 - 36.0 g/dL Final  . RDW 91/97/9439 14.0  11.5 - 15.5 % Final  . Platelets 02/21/2014 51* 150 - 400 K/uL Final  . Neutrophils  Relative % 02/21/2014 38* 43 - 77 % Final  . Neutro Abs 02/21/2014 1.2* 1.7 - 7.7 K/uL Final  . Lymphocytes Relative 02/21/2014 52* 12 - 46 % Final  . Lymphs Abs 02/21/2014 1.7  0.7 - 4.0 K/uL Final  . Monocytes Relative 02/21/2014 10  3 - 12 % Final  . Monocytes Absolute 02/21/2014 0.3  0.1 - 1.0 K/uL Final  . Eosinophils Relative 02/21/2014 2  0 - 5 % Final  . Eosinophils Absolute 02/21/2014 0.1  0.0 - 0.7 K/uL Final  . Basophils Relative 02/21/2014 0  0 - 1 % Final  . Basophils Absolute 02/21/2014 0.0  0.0 -  0.1 K/uL Final  . Sodium 02/21/2014 140  137 - 147 mEq/L Final  . Potassium 02/21/2014 4.4  3.7 - 5.3 mEq/L Final  . Chloride 02/21/2014 108  96 - 112 mEq/L Final  . CO2 02/21/2014 23  19 - 32 mEq/L Final  . Glucose, Bld 02/21/2014 241* 70 - 99 mg/dL Final  . BUN 02/21/2014 12  6 - 23 mg/dL Final  . Creatinine, Ser 02/21/2014 0.76  0.50 - 1.35 mg/dL Final  . Calcium 02/21/2014 8.4  8.4 - 10.5 mg/dL Final  . Total Protein 02/21/2014 6.6  6.0 - 8.3 g/dL Final  . Albumin 02/21/2014 2.8* 3.5 - 5.2 g/dL Final  . AST 02/21/2014 176* 0 - 37 U/L Final  . ALT 02/21/2014 81* 0 - 53 U/L Final  . Alkaline Phosphatase 02/21/2014 128* 39 - 117 U/L Final  . Total Bilirubin 02/21/2014 2.0* 0.3 - 1.2 mg/dL Final  . GFR calc non Af Amer 02/21/2014 >90  >90 mL/min Final  . GFR calc Af Amer 02/21/2014 >90  >90 mL/min Final   Comment: (NOTE)                          The eGFR has been calculated using the CKD EPI equation.                          This calculation has not been validated in all clinical situations.                          eGFR's persistently <90 mL/min signify possible Chronic Kidney                          Disease.  . Anion gap 02/21/2014 9  5 - 15 Final  . Ammonia 02/21/2014 87* 11 - 60 umol/L Final  Admission on 01/29/2014, Discharged on 01/31/2014  Component Date Value Ref Range Status  . Sodium 01/29/2014 128* 137 - 147 mEq/L Final  . Potassium 01/29/2014 4.1  3.7 - 5.3 mEq/L Final  . Chloride 01/29/2014 96  96 - 112 mEq/L Final  . CO2 01/29/2014 16* 19 - 32 mEq/L Final  . Glucose, Bld 01/29/2014 173* 70 - 99 mg/dL Final  . BUN 01/29/2014 14  6 - 23 mg/dL Final  . Creatinine, Ser 01/29/2014 0.91  0.50 - 1.35 mg/dL Final  . Calcium 01/29/2014 8.4  8.4 - 10.5 mg/dL Final  . Total Protein 01/29/2014 6.7  6.0 - 8.3 g/dL Final  . Albumin 01/29/2014 2.6* 3.5 - 5.2 g/dL Final  . AST 01/29/2014 179* 0 - 37 U/L Final  . ALT 01/29/2014 87* 0 - 53 U/L Final  . Alkaline Phosphatase  01/29/2014 106  39 -  117 U/L Final  . Total Bilirubin 01/29/2014 2.1* 0.3 - 1.2 mg/dL Final  . GFR calc non Af Amer 01/29/2014 >90  >90 mL/min Final  . GFR calc Af Amer 01/29/2014 >90  >90 mL/min Final   Comment: (NOTE)                          The eGFR has been calculated using the CKD EPI equation.                          This calculation has not been validated in all clinical situations.                          eGFR's persistently <90 mL/min signify possible Chronic Kidney                          Disease.  . WBC 01/29/2014 2.0* 4.0 - 10.5 K/uL Final  . RBC 01/29/2014 3.44* 4.22 - 5.81 MIL/uL Final  . Hemoglobin 01/29/2014 11.1* 13.0 - 17.0 g/dL Final  . HCT 01/29/2014 31.7* 39.0 - 52.0 % Final  . MCV 01/29/2014 92.2  78.0 - 100.0 fL Final  . MCH 01/29/2014 32.3  26.0 - 34.0 pg Final  . MCHC 01/29/2014 35.0  30.0 - 36.0 g/dL Final  . RDW 01/29/2014 14.5  11.5 - 15.5 % Final  . Platelets 01/29/2014 43* 150 - 400 K/uL Final   Comment: SPECIMEN CHECKED FOR CLOTS                          PLATELET COUNT CONFIRMED BY SMEAR  . Neutrophils Relative % 01/29/2014 59  43 - 77 % Final  . Neutro Abs 01/29/2014 1.2* 1.7 - 7.7 K/uL Final  . Lymphocytes Relative 01/29/2014 26  12 - 46 % Final  . Lymphs Abs 01/29/2014 0.5* 0.7 - 4.0 K/uL Final  . Monocytes Relative 01/29/2014 15* 3 - 12 % Final  . Monocytes Absolute 01/29/2014 0.3  0.1 - 1.0 K/uL Final  . Eosinophils Relative 01/29/2014 0  0 - 5 % Final  . Eosinophils Absolute 01/29/2014 0.0  0.0 - 0.7 K/uL Final  . Basophils Relative 01/29/2014 0  0 - 1 % Final  . Basophils Absolute 01/29/2014 0.0  0.0 - 0.1 K/uL Final  . Lipase 01/29/2014 60* 11 - 59 U/L Final  . Ammonia 01/29/2014 89* 11 - 60 umol/L Final  . Troponin I 01/29/2014 <0.30  <0.30 ng/mL Final   Comment:                                 Due to the release kinetics of cTnI,                          a negative result within the first hours                          of the onset of  symptoms does not rule out                          myocardial infarction with certainty.  If myocardial infarction is still suspected,                          repeat the test at appropriate intervals.  . Color, Urine 01/29/2014 ORANGE* YELLOW Final   BIOCHEMICALS MAY BE AFFECTED BY COLOR  . APPearance 01/29/2014 HAZY* CLEAR Final  . Specific Gravity, Urine 01/29/2014 >1.030* 1.005 - 1.030 Final  . pH 01/29/2014 6.0  5.0 - 8.0 Final  . Glucose, UA 01/29/2014 NEGATIVE  NEGATIVE mg/dL Final  . Hgb urine dipstick 01/29/2014 MODERATE* NEGATIVE Final  . Bilirubin Urine 01/29/2014 MODERATE* NEGATIVE Final  . Ketones, ur 01/29/2014 TRACE* NEGATIVE mg/dL Final  . Protein, ur 01/29/2014 100* NEGATIVE mg/dL Final  . Urobilinogen, UA 01/29/2014 2.0* 0.0 - 1.0 mg/dL Final  . Nitrite 01/29/2014 NEGATIVE  NEGATIVE Final  . Leukocytes, UA 01/29/2014 TRACE* NEGATIVE Final  . Glucose-Capillary 01/29/2014 140* 70 - 99 mg/dL Final  . WBC, UA 01/29/2014 7-10  <3 WBC/hpf Final  . RBC / HPF 01/29/2014 0-2  <3 RBC/hpf Final  . Bacteria, UA 01/29/2014 FEW* RARE Final  . Urine-Other 01/29/2014 MUCOUS PRESENT   Final  . Specimen Description 01/29/2014 URINE, CLEAN CATCH   Final  . Special Requests 01/29/2014 IMMUNE:COMPROMISED   Final  . Culture  Setup Time 01/29/2014    Final                   Value:01/29/2014 22:54                         Performed at [facility]  . Colony Count 01/29/2014    Final                   Value:60,000 COLONIES/ML                         Performed at [facility]  . Culture 01/29/2014    Final                   Value:STAPHYLOCOCCUS AUREUS                         Note: RIFAMPIN AND GENTAMICIN SHOULD NOT BE USED AS SINGLE DRUGS FOR TREATMENT OF STAPH INFECTIONS.                         Performed at [facility]  . Report Status 01/29/2014 02/01/2014 FINAL   Final  . Organism ID, Bacteria 01/29/2014 STAPHYLOCOCCUS AUREUS   Final  .  MRSA by PCR 01/29/2014 NEGATIVE  NEGATIVE Final   Comment:                                 The GeneXpert MRSA Assay (FDA                          approved for NASAL specimens                          only), is one component of a                          comprehensive MRSA colonization  surveillance program. It is not                          intended to diagnose MRSA                          infection nor to guide or                          monitor treatment for                          MRSA infections.  Marland Kitchen Specimen Description 01/29/2014 BLOOD LEFT HAND   Final  . Special Requests 01/29/2014 BOTTLES DRAWN AEROBIC AND ANAEROBIC 6CC   Final  . Culture 01/29/2014 NO GROWTH 5 DAYS   Final  . Report Status 01/29/2014 02/03/2014 FINAL   Final  . Specimen Description 01/29/2014 BLOOD RIGHT ANTECUBITAL   Final  . Special Requests 01/29/2014 BOTTLES DRAWN AEROBIC AND ANAEROBIC 8CC   Final  . Culture 01/29/2014 NO GROWTH 5 DAYS   Final  . Report Status 01/29/2014 02/03/2014 FINAL   Final  . Glucose-Capillary 01/29/2014 122* 70 - 99 mg/dL Final  . Comment 1 01/29/2014 Documented in Chart   Final  . Comment 2 01/29/2014 Notify RN   Final  . Sodium 01/30/2014 133* 137 - 147 mEq/L Final  . Potassium 01/30/2014 4.1  3.7 - 5.3 mEq/L Final  . Chloride 01/30/2014 104  96 - 112 mEq/L Final   DELTA CHECK NOTED  . CO2 01/30/2014 20  19 - 32 mEq/L Final  . Glucose, Bld 01/30/2014 156* 70 - 99 mg/dL Final  . BUN 01/30/2014 21  6 - 23 mg/dL Final  . Creatinine, Ser 01/30/2014 1.15  0.50 - 1.35 mg/dL Final  . Calcium 01/30/2014 7.9* 8.4 - 10.5 mg/dL Final  . Total Protein 01/30/2014 5.6* 6.0 - 8.3 g/dL Final  . Albumin 01/30/2014 2.2* 3.5 - 5.2 g/dL Final  . AST 01/30/2014 158* 0 - 37 U/L Final  . ALT 01/30/2014 74* 0 - 53 U/L Final  . Alkaline Phosphatase 01/30/2014 88  39 - 117 U/L Final  . Total Bilirubin 01/30/2014 1.8* 0.3 - 1.2 mg/dL Final  . GFR calc non Af Amer 01/30/2014  72* >90 mL/min Final  . GFR calc Af Amer 01/30/2014 83* >90 mL/min Final   Comment: (NOTE)                          The eGFR has been calculated using the CKD EPI equation.                          This calculation has not been validated in all clinical situations.                          eGFR's persistently <90 mL/min signify possible Chronic Kidney                          Disease.  . WBC 01/30/2014 1.2* 4.0 - 10.5 K/uL Final   Comment: RESULT REPEATED AND VERIFIED                          WHITE COUNT CONFIRMED  ON SMEAR                          CRITICAL RESULT CALLED TO, READ BACK BY AND VERIFIED WITH:                          Idamae Schuller ON [id number] AT 0615 BY RESSEGGER R  . RBC 01/30/2014 2.98* 4.22 - 5.81 MIL/uL Final  . Hemoglobin 01/30/2014 9.6* 13.0 - 17.0 g/dL Final  . HCT 01/30/2014 27.6* 39.0 - 52.0 % Final  . MCV 01/30/2014 92.6  78.0 - 100.0 fL Final  . MCH 01/30/2014 32.2  26.0 - 34.0 pg Final  . MCHC 01/30/2014 34.8  30.0 - 36.0 g/dL Final  . RDW 01/30/2014 14.8  11.5 - 15.5 % Final  . Platelets 01/30/2014 39* 150 - 400 K/uL Final   Comment: SPECIMEN CHECKED FOR CLOTS                          PLATELET COUNT CONFIRMED BY SMEAR  . Prothrombin Time 01/30/2014 18.3* 11.6 - 15.2 seconds Final  . INR 01/30/2014 1.57* 0.00 - 1.49 Final  . Ammonia 01/30/2014 73* 11 - 60 umol/L Final  . Glucose-Capillary 01/29/2014 162* 70 - 99 mg/dL Final  . Glucose-Capillary 01/30/2014 123* 70 - 99 mg/dL Final  . Comment 1 01/30/2014 Documented in Chart   Final  . Comment 2 01/30/2014 Notify RN   Final  . Glucose-Capillary 01/30/2014 109* 70 - 99 mg/dL Final  . Comment 1 01/30/2014 Notify RN   Final  . Sodium 01/31/2014 131* 137 - 147 mEq/L Final  . Potassium 01/31/2014 4.4  3.7 - 5.3 mEq/L Final  . Chloride 01/31/2014 100  96 - 112 mEq/L Final  . CO2 01/31/2014 20  19 - 32 mEq/L Final  . Glucose, Bld 01/31/2014 150* 70 - 99 mg/dL Final  . BUN 01/31/2014 19  6 - 23 mg/dL Final  .  Creatinine, Ser 01/31/2014 0.91  0.50 - 1.35 mg/dL Final  . Calcium 01/31/2014 7.9* 8.4 - 10.5 mg/dL Final  . Total Protein 01/31/2014 6.5  6.0 - 8.3 g/dL Final  . Albumin 01/31/2014 2.6* 3.5 - 5.2 g/dL Final  . AST 01/31/2014 175* 0 - 37 U/L Final  . ALT 01/31/2014 83* 0 - 53 U/L Final  . Alkaline Phosphatase 01/31/2014 100  39 - 117 U/L Final  . Total Bilirubin 01/31/2014 2.0* 0.3 - 1.2 mg/dL Final  . GFR calc non Af Amer 01/31/2014 >90  >90 mL/min Final  . GFR calc Af Amer 01/31/2014 >90  >90 mL/min Final   Comment: (NOTE)                          The eGFR has been calculated using the CKD EPI equation.                          This calculation has not been validated in all clinical situations.                          eGFR's persistently <90 mL/min signify possible Chronic Kidney                          Disease.  . WBC 01/31/2014 1.9* 4.0 -  10.5 K/uL Final  . RBC 01/31/2014 3.23* 4.22 - 5.81 MIL/uL Final  . Hemoglobin 01/31/2014 10.5* 13.0 - 17.0 g/dL Final  . HCT 01/31/2014 29.7* 39.0 - 52.0 % Final  . MCV 01/31/2014 92.0  78.0 - 100.0 fL Final  . MCH 01/31/2014 32.5  26.0 - 34.0 pg Final  . MCHC 01/31/2014 35.4  30.0 - 36.0 g/dL Final  . RDW 01/31/2014 14.8  11.5 - 15.5 % Final  . Platelets 01/31/2014 43* 150 - 400 K/uL Final   Comment: SPECIMEN CHECKED FOR CLOTS                          PLATELET COUNT CONFIRMED BY SMEAR  . Neutrophils Relative % 01/31/2014 24* 43 - 77 % Final  . Lymphocytes Relative 01/31/2014 41  12 - 46 % Final  . Monocytes Relative 01/31/2014 15* 3 - 12 % Final  . Eosinophils Relative 01/31/2014 1  0 - 5 % Final  . Basophils Relative 01/31/2014 1  0 - 1 % Final  . Band Neutrophils 01/31/2014 16* 0 - 10 % Final  . Metamyelocytes Relative 01/31/2014 0   Final  . Myelocytes 01/31/2014 2   Final  . Promyelocytes Absolute 01/31/2014 0   Final  . Blasts 01/31/2014 0   Final  . nRBC 01/31/2014 0  0 /100 WBC Final  . Neutro Abs 01/31/2014 0.8* 1.7 - 7.7 K/uL  Final  . Lymphs Abs 01/31/2014 0.8  0.7 - 4.0 K/uL Final  . Monocytes Absolute 01/31/2014 0.3  0.1 - 1.0 K/uL Final  . Eosinophils Absolute 01/31/2014 0.0  0.0 - 0.7 K/uL Final  . Basophils Absolute 01/31/2014 0.0  0.0 - 0.1 K/uL Final  . WBC Morphology 01/31/2014 ATYPICAL LYMPHOCYTES   Final  . Smear Review 01/31/2014 LARGE PLATELETS PRESENT   Final  . Prothrombin Time 01/31/2014 15.4* 11.6 - 15.2 seconds Final  . INR 01/31/2014 1.25  0.00 - 1.49 Final  . Glucose-Capillary 01/30/2014 135* 70 - 99 mg/dL Final  . Comment 1 01/30/2014 Notify RN   Final  . Glucose-Capillary 01/30/2014 136* 70 - 99 mg/dL Final  . Comment 1 01/30/2014 Notify RN   Final  . Comment 2 01/30/2014 Documented in Chart   Final  . Glucose-Capillary 01/31/2014 139* 70 - 99 mg/dL Final  . Comment 1 01/31/2014 Notify RN   Final  . Ammonia 01/31/2014 122* 11 - 60 umol/L Final  . Glucose-Capillary 01/31/2014 152* 70 - 99 mg/dL Final  . Comment 1 01/31/2014 Notify RN   Final    PATHOLOGY: No new pathology.  Urinalysis    Component Value Date/Time   COLORURINE YELLOW 02/21/2014 1055   APPEARANCEUR CLEAR 02/21/2014 1055   LABSPEC 1.010 02/21/2014 1055   PHURINE 6.0 02/21/2014 1055   GLUCOSEU NEGATIVE 02/21/2014 1055   HGBUR TRACE* 02/21/2014 1055   BILIRUBINUR NEGATIVE 02/21/2014 1055   KETONESUR NEGATIVE 02/21/2014 1055   PROTEINUR NEGATIVE 02/21/2014 1055   UROBILINOGEN 0.2 02/21/2014 1055   NITRITE NEGATIVE 02/21/2014 1055   LEUKOCYTESUR NEGATIVE 02/21/2014 1055    RADIOGRAPHIC STUDIES: Mr Lumbar Spine W Wo Contrast  01/30/2014   CLINICAL DATA:  Low back pain.  Bilateral leg weakness.  Fever.  EXAM: MRI LUMBAR SPINE WITHOUT AND WITH CONTRAST  TECHNIQUE: Multiplanar and multiecho pulse sequences of the lumbar spine were obtained without and with intravenous contrast.  CONTRAST:  18mL MULTIHANCE GADOBENATE DIMEGLUMINE 529 MG/ML IV SOLN  COMPARISON:  CT abdomen and pelvis  07/22/2013  FINDINGS: Normal signal is present in  the conus medullaris which terminates at L1, within normal limits. Marrow signal, vertebral body heights, alignment are normal. There slight desiccation of the disc at L4-5. No abnormal fluid signal or enhancement is present to suggest discitis. There is no pathologic enhancement of the thoracolumbar vertebral bodies.  Limited imaging of the abdomen is unremarkable.  L2-3:  Negative.  L3-4:  Negative.  L4-5: Mild lateral disc bulging is present without significant stenosis.  L5-S1: A central disc protrusion is present. There is mild encroachment of the lateral recess bilaterally, worse on the right. Facet hypertrophy is worse on the right. Mild right foraminal narrowing is evident.  S1-2: Asymmetric right-sided facet hypertrophy is present. There is no significant stenosis.  IMPRESSION: 1. L5-S1 disc protrusion with mild lateral recess narrowing, worse on the right. 2. Mild lateral disc bulging at L4-5 without significant stenosis. 3. Transitional S1 anatomy with asymmetric right-sided facet hypertrophy at S1-2 but no significant stenosis.   Electronically Signed   By: Gennette Pac M.D.   On: 01/30/2014 11:25   US Abdomen Limited  01/30/2014   CLINICAL DATA:  Fever, ascites  EXAM: US ABDOMEN LIMITED - RIGHT UPPER QUADRANT  COMPARISON:  CT 01/16/2014  FINDINGS: Survey ultrasound of the abdomen demonstrates minimal ascites. No fluid collection large enough for paracentesis.  IMPRESSION:  Minimal ascites.  No drainable volume   Electronically Signed   By: Genevive Bi M.D.   On: 01/30/2014 12:24   Dg Abd Acute W/chest  01/29/2014   CLINICAL DATA:  Ascites, fever, cough, chest tightness, lower abdominal pain, history hypertension, diabetes, cirrhosis, GERD, COPD  EXAM: ACUTE ABDOMEN SERIES (ABDOMEN 2 VIEW & CHEST 1 VIEW)  COMPARISON:  Chest radiograph 01/16/2014, abdominal radiographs 10/24/2013  FINDINGS: Borderline enlargement of cardiac silhouette.  Mediastinal contours and pulmonary vascularity normal.   Lungs clear.  No pleural effusion or pneumothorax.  Suboptimal assessment of bowel gas pattern due to body habitus.  Paucity of bowel gas.  No gross bowel distention or free intraperitoneal air identified on limited assessment.  IMPRESSION: Limited exam showing no definite acute abdominal findings.  Borderline enlargement of cardiac silhouette.   Electronically Signed   By: Ulyses Southward M.D.   On: 01/29/2014 12:29    ASSESSMENT:  .#1. 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  #2. Right quadrant bowel pain probably secondary to stretching of the liver capsule and cholelithiasis.,, No evidence of hepatocellular carcinoma transformation.  #3. History of iron deficiency, awaiting today's ferritin and soluble transferrin receptor report, status post Feraheme on 11/15/2013.       PLAN:  #1. Oxycodone 10 mg around the clock. #2. Urinalysis today showed no blood. #3. Referral to hepatology for Harvoni therapy, to see Dr. Jacqualine Mau at [facility]. #4. Followup in 2 months with CBC, ferritin.   All questions were answered. The patient knows to call the clinic with any problems, questions or concerns. We can certainly see the patient much sooner if necessary.   I spent 25 minutes counseling the patient face to face. The total time spent in the appointment was 30 minutes she.    Maurilio Lovely, MD 02/21/2014 1:03 PM  DISCLAIMER:  This note was dictated with voice recognition software.  Similar sounding words can inadvertently be transcribed inaccurately and may not be corrected  upon review.

## 2014-02-21 NOTE — Progress Notes (Signed)
LABS DRAWN FOR AMMON,CBCD,STR,FERR,CMP

## 2014-02-21 NOTE — Patient Instructions (Addendum)
[facility] Discharge Instructions  RECOMMENDATIONS MADE BY THE CONSULTANT AND ANY TEST RESULTS WILL BE SENT TO YOUR REFERRING PHYSICIAN. We will see you in 2 months for repeat lab work and a [facility] appointment. Please call for any questions or concerns.   Thank you for choosing [facility] to provide your oncology and hematology care.  To afford each patient quality time with our providers, please arrive at least 15 minutes before your scheduled appointment time.  With your help, our goal is to use those 15 minutes to complete the necessary work-up to ensure our physicians have the information they need to help with your evaluation and healthcare recommendations.    Effective January 1st, 2014, we ask that you re-schedule your appointment with our physicians should you arrive 10 or more minutes late for your appointment.  We strive to give you quality time with our providers, and arriving late affects you and other patients whose appointments are after yours.    Again, thank you for choosing [facility].  Our hope is that these requests will decrease the amount of time that you wait before being seen by our physicians.       _____________________________________________________________  Should you have questions after your visit to [facility], please contact our office at [phone] between the hours of 8:30 a.m. and 4:30 p.m.  Voicemails left after 4:30 p.m. will not be returned until the following business day.  For prescription refill requests, have your pharmacy contact our office with your prescription refill request.    _______________________________________________________________  We hope that we have given you very good care.  You may receive a patient satisfaction survey in the mail, please complete it and return it as soon as possible.  We value your  feedback!  _______________________________________________________________  Have you asked about our STAR program?  STAR stands for Survivorship Training and Rehabilitation, and this is a nationally recognized cancer care program that focuses on survivorship and rehabilitation.  Cancer and cancer treatments may cause problems, such as, pain, making you feel tired and keeping you from doing the things that you need or want to do. Cancer rehabilitation can help. Our goal is to reduce these troubling effects and help you have the best quality of life possible.  You may receive a survey from a nurse that asks questions about your current state of health.  Based on the survey results, all eligible patients will be referred to the [facility] program for an evaluation so we can better serve you!  A frequently asked questions sheet is available upon request.

## 2014-02-22 ENCOUNTER — Telehealth (HOSPITAL_COMMUNITY): Payer: Self-pay

## 2014-02-22 ENCOUNTER — Other Ambulatory Visit (HOSPITAL_COMMUNITY): Payer: Self-pay | Admitting: Hematology and Oncology

## 2014-02-22 NOTE — Telephone Encounter (Signed)
Spoke with wife and appointment times given.  Verbalized understanding of instructions.

## 2014-02-22 NOTE — Telephone Encounter (Signed)
Message copied by Mellissa Kohut on Wed Feb 22, 2014  1:44 PM ------      Message from: [place], [place]: Wed Feb 22, 2014  7:50 AM       Have ordered Feraheme for 02/24/2014 and 03/03/2014. Please notify patient and schedule.   Thanks.  ------

## 2014-02-24 ENCOUNTER — Encounter (HOSPITAL_BASED_OUTPATIENT_CLINIC_OR_DEPARTMENT_OTHER): Payer: Medicare Other

## 2014-02-24 VITALS — BP 139/56 | HR 70 | Temp 98.0°F | Resp 20

## 2014-02-24 DIAGNOSIS — D509 Iron deficiency anemia, unspecified: Secondary | ICD-10-CM

## 2014-02-24 LAB — SOLUBLE TRANSFERRIN RECEPTOR: TRANSFERRIN RECEPTOR, SOLUBLE: 1.84 mg/L — AB (ref 0.76–1.76)

## 2014-02-24 MED ORDER — SODIUM CHLORIDE 0.9 % IV SOLN
510.0000 mg | Freq: Once | INTRAVENOUS | Status: AC
  Administered 2014-02-24: 510 mg via INTRAVENOUS
  Filled 2014-02-24: qty 17

## 2014-02-24 MED ORDER — SODIUM CHLORIDE 0.9 % IV SOLN
INTRAVENOUS | Status: DC
  Administered 2014-02-24: 10 mL/h via INTRAVENOUS

## 2014-02-24 NOTE — Progress Notes (Signed)
[name] Standing presented today for IV iron infusion. Tolerated without incident.  Discharged ambulatory

## 2014-03-01 ENCOUNTER — Ambulatory Visit (INDEPENDENT_AMBULATORY_CARE_PROVIDER_SITE_OTHER): Payer: Medicare Other | Admitting: Gastroenterology

## 2014-03-01 ENCOUNTER — Ambulatory Visit (INDEPENDENT_AMBULATORY_CARE_PROVIDER_SITE_OTHER): Payer: Medicare Other | Admitting: Family Medicine

## 2014-03-01 ENCOUNTER — Encounter: Payer: Self-pay | Admitting: Gastroenterology

## 2014-03-01 ENCOUNTER — Encounter: Payer: Self-pay | Admitting: Family Medicine

## 2014-03-01 VITALS — BP 184/79 | HR 85 | Temp 98.5°F | Ht 67.0 in | Wt 284.0 lb

## 2014-03-01 VITALS — BP 130/61 | HR 76 | Temp 97.4°F | Ht 67.0 in | Wt 281.4 lb

## 2014-03-01 DIAGNOSIS — E1159 Type 2 diabetes mellitus with other circulatory complications: Secondary | ICD-10-CM

## 2014-03-01 DIAGNOSIS — B182 Chronic viral hepatitis C: Secondary | ICD-10-CM

## 2014-03-01 DIAGNOSIS — K7682 Hepatic encephalopathy: Secondary | ICD-10-CM

## 2014-03-01 DIAGNOSIS — K7290 Hepatic failure, unspecified without coma: Secondary | ICD-10-CM

## 2014-03-01 DIAGNOSIS — R609 Edema, unspecified: Secondary | ICD-10-CM

## 2014-03-01 DIAGNOSIS — R109 Unspecified abdominal pain: Secondary | ICD-10-CM

## 2014-03-01 DIAGNOSIS — K7460 Unspecified cirrhosis of liver: Secondary | ICD-10-CM

## 2014-03-01 LAB — POCT UA - MICROALBUMIN: Microalbumin Ur, POC: NEGATIVE mg/L

## 2014-03-01 LAB — POCT GLYCOSYLATED HEMOGLOBIN (HGB A1C): HEMOGLOBIN A1C: 6

## 2014-03-01 MED ORDER — SPIRONOLACTONE 50 MG PO TABS: 100.0000 mg | ORAL_TABLET | Freq: Every day | ORAL | Status: DC

## 2014-03-01 MED ORDER — GLIPIZIDE 10 MG PO TABS: 10.0000 mg | ORAL_TABLET | Freq: Every day | ORAL | Status: DC

## 2014-03-01 MED ORDER — FUROSEMIDE 20 MG PO TABS: 20.0000 mg | ORAL_TABLET | Freq: Every day | ORAL | Status: DC

## 2014-03-01 NOTE — Assessment & Plan Note (Signed)
RESOLVED. ACUTE EXACERBATION DUE TO FEBRILE ILLNESS(VIRAL, LESS LIKELY SBP)  LACTULOSE PRN FOLLOW UP WITH [facility]/HEP C CLINIC FOLLOW UP IN 6 MOS.

## 2014-03-01 NOTE — Progress Notes (Signed)
   Subjective:    Patient ID: [name], male    DOB: [birth year], 53 y.o.   MRN: [id number]  HPI 53 year old male with chronic hepatitis C and cirrhosis who is followed in liver transplantation clinic in [place]. He was seen by his gastroenterologist this morning and was told that he needed a primary care physician to follow his diabetes and hypertension. His chief complaint today is dependent edema. He has taken his medicine for diabetes sporadically depending on if he had money to buy the medicine he is now on Medicaid and Medicare    Review of Systems  Constitutional: Positive for chills.  HENT: Negative.   Eyes: Negative.   Respiratory: Negative.   Gastrointestinal: Positive for abdominal pain and diarrhea (with lactulose). Abdominal distention: related to ascites and hepatomegaly.  Musculoskeletal: Negative.  Negative for gait problem.  Neurological: Negative.   Psychiatric/Behavioral: Negative.        Objective:   Physical Exam  Constitutional: He is oriented to person, place, and time. He appears well-developed and well-nourished.  HENT:  Head: Normocephalic.  Right Ear: External ear normal.  Left Ear: External ear normal.  Nose: Nose normal.  Mouth/Throat: Oropharynx is clear and moist.  Eyes: Conjunctivae and EOM are normal. Pupils are equal, round, and reactive to light.  Neck: Normal range of motion. Neck supple.  Cardiovascular: Normal rate, regular rhythm, normal heart sounds and intact distal pulses.   Pulmonary/Chest: Effort normal and breath sounds normal.  Abdominal: Soft. Bowel sounds are normal. There is tenderness (ruq C/W liver enlargement).  Musculoskeletal: Normal range of motion.  Neurological: He is alert and oriented to person, place, and time.  Skin: Skin is warm and dry.  Psychiatric: He has a normal mood and affect. His behavior is normal. Judgment and thought content normal.          Assessment & Plan:  1. Type II or unspecified type  diabetes mellitus with peripheral circulatory disorders, uncontrolled(250.72) Provided a new glucometer - POCT glycosylated hemoglobin (Hb A1C) - POCT UA - Microalbumin  2. Cirrhosis of liver without mention of alcohol Followed by GI and liver transplant - CMP14+EGFR  3. Edema Probably related to protein and absence of diuretic  4. Encephalopathy, hepatic Stable; has lactulose and understands when to use - CMP14+EGFR  [name] MD

## 2014-03-01 NOTE — Assessment & Plan Note (Signed)
WITH KNOWN HISTORY OF BILIARY DYSKINESIA/GALLSTONES. SX CONTROLLED.  CONTINUE TO MONITOR SYMPTOMS-POST-PRANDIAL RUQ ABD PAIN, NAUSEA/VOMITING. DISCUSSED WITH PT AND WIFE.

## 2014-03-01 NOTE — Addendum Note (Signed)
Addended by: Jamelle Haring on: 03/01/2014 05:39 PM   Modules accepted: Orders

## 2014-03-01 NOTE — Progress Notes (Addendum)
Subjective:    Patient ID: [name], male    DOB: Apr 07, 1961, 53 y.o.   MRN: [id number]  PCP-DR/ MILLER, [place]  HPI NOW HAS MEDICAID. QUESTIONS: SWELLING IN LEGS/FEET, CRAMPS IN LEGS. BEEN USING SALT ON CUCUMBERS/TOMATOES.  GOING TO [place] SEP 2015-[facility]CV Rx. EATING BETTER. TRYING LOSE WEIGHT/ GETTING IVFE. BMS: GOOD, 3-4X/DAY: SOFT/SOLID/WATERY. RARE PAIN IN [place].  PT DENIES FEVER, CHILLS, HEMATOCHEZIA, nausea, vomiting, melena, diarrhea, CHEST PAIN, SHORTNESS OF BREATH,  constipation, abdominal pain, problems swallowing, OR heartburn or indigestion.  Past Medical History  Diagnosis Date  . Hypertension   . Diabetes mellitus   . GERD (gastroesophageal reflux disease)     DEC 2010 EGD/Bx REACTIVE GASTROPATHY, NO VARICES  . Hemorrhoids, internal   . BMI 40.0-44.9, adult OCT 2010 269 LBS    APR 2012 279 LBS AUG 2014 185 LBS  . 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 IV drug abuse REMOTE  . Hepatitis 2010 HEP C    AST 509 ALT 267 ALK PHOS 165 ALB 3.8 NEG IGM HAV/HBSAg  . Gallstone AUG 2012 1 CM  . GERD 10/25/2009  . COPD (chronic obstructive pulmonary disease)   . Hepatitis C   . Pancytopenia 2013  . Splenomegaly 2013  . Other pancytopenia 02/18/2013  . Iron (Fe) deficiency anemia 02/18/2013  . Splenomegaly 02/18/2013  . Neuropathy    Past Surgical History  Procedure Laterality Date  . Sigmoidoscopy      2001 DR. FLEISCHMAN INTERNAL HERMORRHOIDS  . Upper gastrointestinal endoscopy  DEC 2010    BENIGN POLYPS, GASTRITIS, ?phg  . Knee surgery  RIGHT  . Hemorrhoid surgery    . Esophageal biopsy  09/08/2011    [name]:Moderate gastritis/Polyps, multiple in the body of the stomach  . Colonoscopy with propofol N/A 11/15/2013    [name]: rectal varices, small AVMs  . Esophagogastroduodenoscopy (egd) with propofol N/A 11/15/2013    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 Next EGD in  April 2016.      No Known Allergies  Current Outpatient Prescriptions  Medication Sig Dispense Refill  . acetaminophen (TYLENOL) 500 MG tablet Take 1,000 mg by mouth every 6 (six) hours as needed for mild pain.    Marland Kitchen albuterol (PROVENTIL HFA;VENTOLIN HFA) 108 (90 BASE) MCG/ACT inhaler Inhale 2 puffs into the lungs every 6 (six) hours as needed. Shortness of breath    . esomeprazole (NEXIUM) 40 MG capsule Take 1 capsule (40 mg total) by mouth 2 (two) times daily before a meal.    . furosemide (LASIX) 20 MG tablet Take 20 mg by mouth daily.    . insulin glargine (LANTUS) 100 UNIT/ML injection Inject 10 Units into the skin at bedtime.    . Oxycodone HCl 10 MG TABS Take 1 or 2 tablets every 4 hours to control pain.    .      . glipiZIDE (GLUCOTROL) 10 MG tablet Take 10 mg by mouth daily.     Marland Kitchen lactulose (CHRONULAC) 10 GM/15ML solution Take 45 mLs (30 g total) by mouth 3 (three) times daily. PRN, DOSE LAST WEEK        Review of Systems     Objective:   Physical Exam  Vitals reviewed. Constitutional: He is oriented to person, place, and time. He appears well-developed and well-nourished. No distress.  HENT:  Head: Normocephalic and atraumatic.  Mouth/Throat: Oropharynx is clear and moist. No  oropharyngeal exudate.  Eyes: Pupils are equal, round, and reactive to light. No scleral icterus.  Neck: Normal range of motion. Neck supple.  Cardiovascular: Normal rate and regular rhythm.   Murmur heard. Pulmonary/Chest: Effort normal and breath sounds normal. No respiratory distress.  Abdominal: Soft. Bowel sounds are normal. He exhibits no distension. There is no tenderness.  Musculoskeletal: He exhibits edema.  Lymphadenopathy:    He has no cervical adenopathy.  Neurological: He is alert and oriented to person, place, and time.  Psychiatric: He has a normal mood and affect.          Assessment & Plan:

## 2014-03-01 NOTE — Assessment & Plan Note (Signed)
AWAIT EVAL BY [facility].  CONTINUE WEIGHT LOSS EFFORTS.

## 2014-03-01 NOTE — Patient Instructions (Signed)
FOLLOW UP WITH HEP C CLINIC.  CONTINUE YOUR WEIGHT LOSS EFFORTS.  AVOID SALT IF POSSIBLE.  FOLLOW UP IN 6 MOS.   PLEASE CALL WITH QUESTIONS OR CONCERNS.

## 2014-03-02 ENCOUNTER — Ambulatory Visit (HOSPITAL_COMMUNITY): Payer: MEDICAID

## 2014-03-02 LAB — CMP14+EGFR
A/G RATIO: 1 — AB (ref 1.1–2.5)
ALK PHOS: 125 IU/L — AB (ref 39–117)
ALT: 76 IU/L — ABNORMAL HIGH (ref 0–44)
AST: 148 IU/L — AB (ref 0–40)
Albumin: 3.3 g/dL — ABNORMAL LOW (ref 3.5–5.5)
BUN / CREAT RATIO: 12 (ref 9–20)
BUN: 11 mg/dL (ref 6–24)
CO2: 21 mmol/L (ref 18–29)
CREATININE: 0.91 mg/dL (ref 0.76–1.27)
Calcium: 8.5 mg/dL — ABNORMAL LOW (ref 8.7–10.2)
Chloride: 100 mmol/L (ref 97–108)
GFR calc Af Amer: 112 mL/min/{1.73_m2} (ref >59)
GFR, EST NON AFRICAN AMERICAN: 97 mL/min/{1.73_m2} (ref >59)
GLOBULIN, TOTAL: 3.4 g/dL (ref 1.5–4.5)
Glucose: 290 mg/dL — ABNORMAL HIGH (ref 65–99)
Potassium: 5 mmol/L (ref 3.5–5.2)
Sodium: 135 mmol/L (ref 134–144)
Total Bilirubin: 2 mg/dL — ABNORMAL HIGH (ref 0.0–1.2)
Total Protein: 6.7 g/dL (ref 6.0–8.5)

## 2014-03-03 ENCOUNTER — Encounter (HOSPITAL_BASED_OUTPATIENT_CLINIC_OR_DEPARTMENT_OTHER): Payer: Medicare Other

## 2014-03-03 VITALS — BP 146/66 | HR 67 | Temp 97.9°F | Resp 20

## 2014-03-03 DIAGNOSIS — D509 Iron deficiency anemia, unspecified: Secondary | ICD-10-CM

## 2014-03-03 MED ORDER — SODIUM CHLORIDE 0.9 % IV SOLN
Freq: Once | INTRAVENOUS | Status: AC
  Administered 2014-03-03: 10:00:00 via INTRAVENOUS

## 2014-03-03 MED ORDER — SODIUM CHLORIDE 0.9 % IV SOLN
510.0000 mg | Freq: Once | INTRAVENOUS | Status: AC
  Administered 2014-03-03: 510 mg via INTRAVENOUS
  Filled 2014-03-03: qty 17

## 2014-03-03 MED ORDER — SODIUM CHLORIDE 0.9 % IJ SOLN
10.0000 mL | Freq: Once | INTRAMUSCULAR | Status: AC
  Administered 2014-03-03: 10 mL via INTRAVENOUS

## 2014-03-03 NOTE — Progress Notes (Signed)
1100 - tolerated infusion well without s/s of reaction. A&ox4; in no apparent distress.

## 2014-03-07 ENCOUNTER — Telehealth (HOSPITAL_COMMUNITY): Payer: Self-pay | Admitting: *Deleted

## 2014-03-07 ENCOUNTER — Other Ambulatory Visit (HOSPITAL_COMMUNITY): Payer: Self-pay | Admitting: Hematology and Oncology

## 2014-03-07 DIAGNOSIS — R109 Unspecified abdominal pain: Secondary | ICD-10-CM

## 2014-03-07 MED ORDER — OXYCODONE HCL 10 MG PO TABS
ORAL_TABLET | ORAL | Status: DC
Start: 2014-03-07 — End: 2014-03-29

## 2014-03-07 NOTE — Progress Notes (Signed)
cc'd to pcp 

## 2014-03-07 NOTE — Progress Notes (Signed)
Reminder in epic °

## 2014-03-29 ENCOUNTER — Telehealth (HOSPITAL_COMMUNITY): Payer: Self-pay | Admitting: *Deleted

## 2014-03-29 ENCOUNTER — Other Ambulatory Visit (HOSPITAL_COMMUNITY): Payer: Self-pay | Admitting: Hematology and Oncology

## 2014-03-29 DIAGNOSIS — R109 Unspecified abdominal pain: Secondary | ICD-10-CM

## 2014-03-29 MED ORDER — OXYCODONE HCL 10 MG PO TABS: ORAL_TABLET | ORAL | Status: DC

## 2014-04-11 ENCOUNTER — Ambulatory Visit (HOSPITAL_COMMUNITY)
Admission: RE | Admit: 2014-04-11 | Discharge: 2014-04-11 | Disposition: A | Discharge status: Home / Self Care | Payer: Medicare Other | Source: Ambulatory Visit | Attending: Internal Medicine | Admitting: Internal Medicine

## 2014-04-11 DIAGNOSIS — K8020 Calculus of gallbladder without cholecystitis without obstruction: Secondary | ICD-10-CM | POA: Diagnosis not present

## 2014-04-11 DIAGNOSIS — R188 Other ascites: Secondary | ICD-10-CM | POA: Insufficient documentation

## 2014-04-11 DIAGNOSIS — E119 Type 2 diabetes mellitus without complications: Secondary | ICD-10-CM | POA: Insufficient documentation

## 2014-04-11 DIAGNOSIS — B1920 Unspecified viral hepatitis C without hepatic coma: Secondary | ICD-10-CM | POA: Diagnosis not present

## 2014-04-11 DIAGNOSIS — N200 Calculus of kidney: Secondary | ICD-10-CM | POA: Insufficient documentation

## 2014-04-11 DIAGNOSIS — K766 Portal hypertension: Secondary | ICD-10-CM | POA: Insufficient documentation

## 2014-04-11 DIAGNOSIS — K7460 Unspecified cirrhosis of liver: Secondary | ICD-10-CM | POA: Insufficient documentation

## 2014-04-11 MED ORDER — IOHEXOL 300 MG/ML  SOLN
100.0000 mL | Freq: Once | INTRAMUSCULAR | Status: AC | PRN
  Administered 2014-04-11: 100 mL via INTRAVENOUS

## 2014-04-11 MED ORDER — IOHEXOL 300 MG/ML  SOLN: 25.0000 mL | INTRAMUSCULAR | Status: AC

## 2014-04-12 ENCOUNTER — Other Ambulatory Visit (HOSPITAL_COMMUNITY): Payer: Self-pay | Admitting: Hematology and Oncology

## 2014-04-12 ENCOUNTER — Telehealth (HOSPITAL_COMMUNITY): Payer: Self-pay | Admitting: *Deleted

## 2014-04-12 DIAGNOSIS — R109 Unspecified abdominal pain: Secondary | ICD-10-CM

## 2014-04-12 MED ORDER — OXYCODONE HCL 10 MG PO TABS: ORAL_TABLET | ORAL | Status: DC

## 2014-04-21 ENCOUNTER — Other Ambulatory Visit (HOSPITAL_COMMUNITY): Payer: Self-pay

## 2014-04-25 ENCOUNTER — Encounter (HOSPITAL_COMMUNITY): Payer: Medicare Other | Attending: Hematology and Oncology

## 2014-04-25 ENCOUNTER — Encounter (HOSPITAL_COMMUNITY): Payer: Self-pay

## 2014-04-25 ENCOUNTER — Encounter (HOSPITAL_BASED_OUTPATIENT_CLINIC_OR_DEPARTMENT_OTHER): Payer: Medicare Other

## 2014-04-25 VITALS — BP 143/61 | HR 73 | Temp 97.7°F | Resp 20 | Wt 280.4 lb

## 2014-04-25 DIAGNOSIS — K7460 Unspecified cirrhosis of liver: Secondary | ICD-10-CM | POA: Insufficient documentation

## 2014-04-25 DIAGNOSIS — D509 Iron deficiency anemia, unspecified: Secondary | ICD-10-CM

## 2014-04-25 DIAGNOSIS — K5520 Angiodysplasia of colon without hemorrhage: Secondary | ICD-10-CM | POA: Diagnosis present

## 2014-04-25 DIAGNOSIS — D731 Hypersplenism: Secondary | ICD-10-CM | POA: Diagnosis present

## 2014-04-25 DIAGNOSIS — R109 Unspecified abdominal pain: Secondary | ICD-10-CM

## 2014-04-25 DIAGNOSIS — B182 Chronic viral hepatitis C: Secondary | ICD-10-CM

## 2014-04-25 LAB — CBC WITH DIFFERENTIAL/PLATELET
Basophils Absolute: 0 10*3/uL (ref 0.0–0.1)
Basophils Relative: 0 % (ref 0–1)
EOS PCT: 3 % (ref 0–5)
Eosinophils Absolute: 0.1 10*3/uL (ref 0.0–0.7)
HCT: 33.1 % — ABNORMAL LOW (ref 39.0–52.0)
HEMOGLOBIN: 11.5 g/dL — AB (ref 13.0–17.0)
LYMPHS ABS: 1.3 10*3/uL (ref 0.7–4.0)
LYMPHS PCT: 34 % (ref 12–46)
MCH: 32.3 pg (ref 26.0–34.0)
MCHC: 34.7 g/dL (ref 30.0–36.0)
MCV: 93 fL (ref 78.0–100.0)
MONOS PCT: 14 % — AB (ref 3–12)
Monocytes Absolute: 0.5 10*3/uL (ref 0.1–1.0)
Neutro Abs: 1.8 10*3/uL (ref 1.7–7.7)
Neutrophils Relative %: 49 % (ref 43–77)
PLATELETS: 56 10*3/uL — AB (ref 150–400)
RBC: 3.56 MIL/uL — AB (ref 4.22–5.81)
RDW: 16.1 % — ABNORMAL HIGH (ref 11.5–15.5)
WBC: 3.7 10*3/uL — AB (ref 4.0–10.5)

## 2014-04-25 MED ORDER — OXYCODONE HCL 10 MG PO TABS: ORAL_TABLET | ORAL | Status: DC

## 2014-04-25 NOTE — Progress Notes (Signed)
[facility]  OFFICE PROGRESS NOTE  [place] [place], [address], [place] [address]  DIAGNOSIS: Hypersplenism - Plan: CBC with Differential, Comprehensive metabolic panel, Ferritin, Soluble transferrin receptor  Iron (Fe) deficiency anemia - Plan: CBC with Differential, Comprehensive metabolic panel, Ferritin, Soluble transferrin receptor  Hepatic cirrhosis due to chronic hepatitis C infection - Plan: CBC with Differential, Comprehensive metabolic panel, Ferritin, Soluble transferrin receptor  Angiodysplasia of colon  Flank pain - Plan: Oxycodone HCl 10 MG TABS  Chief Complaint  Patient presents with  . Hypersplenism  . Iron deficiency  . Cholelithiasis    CURRENT THERAPY: IV Feraheme last dose given on 02/24/2014 and 03/03/2014.  INTERVAL HISTORY: [name] 53 y.o. male  returns for followup of pancytopenia secondary to cirrhosis of the liver with iron deficiency, status post IV Feraheme 10/10/2013. His cirrhosis of non-alcohol-related and he has been evaluated in [place], [place] for possible transplant. There is a history of left nephrolithiasis as well as cholelithiasis. Due to his severe underlying liver disease, cholecystectomy has not been done. Last hemoglobin on 714/2015 was 11.1; last ferritin on 02/21/2014 was 48 and therefore given additional Feraheme on 02/24/2014 and 03/03/2014. He is to receive active therapy for hepatitis C and cirrhosis. He does not know the name of the drug is going to be a taking. He continues to take about 6 oxycodone 10 mg per day controlled right upper quadrant pain associated with cholelithiasis. He denies any worsening lower 70 swelling or redness present had a red area on the posterior portion of his That is painful. He denies any PND, orthopnea, palpitations, cough, sore throat, dark urine, light stools.       MEDICAL HISTORY: Past Medical History  Diagnosis Date  . Hypertension     . Diabetes mellitus   . GERD (gastroesophageal reflux disease)     DEC 2010 EGD/Bx REACTIVE GASTROPATHY, NO VARICES  . Hemorrhoids, internal   . BMI 40.0-44.9, adult OCT 2010 269 LBS    APR 2012 279 LBS AUG 2014 185 LBS  . 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 IV drug abuse REMOTE  . Hepatitis 2010 HEP C    AST 509 ALT 267 ALK PHOS 165 ALB 3.8 NEG IGM HAV/HBSAg  . Gallstone AUG 2012 1 CM  . GERD 10/25/2009  . COPD (chronic obstructive pulmonary disease)   . Hepatitis C   . Pancytopenia 2013  . Splenomegaly 2013  . Other pancytopenia 02/18/2013  . Iron (Fe) deficiency anemia 02/18/2013  . Splenomegaly 02/18/2013  . Neuropathy   . Biliary dyskinesia JUL 2015    HIDA GB EF 5%    INTERIM HISTORY: has HEPATITIS C, CHRONIC; DM; ALCOHOL ABUSE; GERD; CIRRHOSIS; UNSPECIFIED DISEASE OF PANCREAS; ABDOMINAL PAIN, CHRONIC; Colon cancer screening; Other pancytopenia; Iron (Fe) deficiency anemia; Hematochezia; Angiodysplasia of colon; Sepsis; and Encephalopathy, hepatic on his problem list.    ALLERGIES:  has No Known Allergies.  MEDICATIONS: has a current medication list which includes the following prescription(s): acetaminophen, albuterol, esomeprazole, furosemide, glipizide, insulin glargine, oxycodone hcl, spironolactone, and UNKNOWN TO PATIENT.  SURGICAL HISTORY:  Past Surgical History  Procedure Laterality Date  . Sigmoidoscopy      2001 DR. FLEISCHMAN INTERNAL HERMORRHOIDS  . Upper gastrointestinal endoscopy  DEC 2010    BENIGN POLYPS, GASTRITIS, ?phg  . Knee surgery  RIGHT  . Hemorrhoid surgery    . Esophageal biopsy  09/08/2011  [name]:Moderate gastritis/Polyps, multiple in the body of the stomach  . Colonoscopy with propofol N/A 11/15/2013    [name]: rectal varices, small AVMs  . Esophagogastroduodenoscopy (egd) with propofol N/A 11/15/2013    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 Next EGD  in April 2016.      FAMILY HISTORY: family history includes Cancer in his father. There is no history of Colon cancer, Anesthesia problems, Hypotension, Malignant hyperthermia, or Pseudochol deficiency.  SOCIAL HISTORY:  reports that he has never smoked. He has never used smokeless tobacco. He reports that he uses illicit drugs (Marijuana and Cocaine). He reports that he does not drink alcohol.  REVIEW OF SYSTEMS:  Other than that discussed above is noncontributory.  PHYSICAL EXAMINATION: ECOG PERFORMANCE STATUS: 1 - Symptomatic but completely ambulatory  Blood pressure 143/61, pulse 73, temperature 97.7 F (36.5 C), temperature source Oral, resp. rate 20, weight 280 lb 6.4 oz (127.189 kg), SpO2 100.00%.  GENERAL:alert, no distress and comfortable SKIN: skin color, texture, turgor are normal, no rashes or significant lesions EYES: PERLA; Conjunctiva are pink and non-injected, sclera clear SINUSES: No redness or tenderness over maxillary or ethmoid sinuses OROPHARYNX:no exudate, no erythema on lips, buccal mucosa, or tongue. NECK: supple, thyroid normal size, non-tender, without nodularity. No masses CHEST: Increased AP diameter with bilateral gynecomastia. Spine angiomata are noted. LYMPH:  no palpable lymphadenopathy in the cervical, axillary or inguinal LUNGS: clear to auscultation and percussion with normal breathing effort HEART: regular rate & rhythm and no murmurs. ABDOMEN:abdomen soft, non-tender and normal bowel sounds. Positive fluid wave and shifting dullness. MUSCULOSKELETAL:no cyanosis of digits and no clubbing. Range of motion normal. Left lower extremity calf with a 5 cm area of redness with tenderness.  NEURO: alert & oriented x 3 with fluent speech, no focal motor/sensory deficits   LABORATORY DATA: Lab on 04/25/2014  Component Date Value Ref Range Status  . WBC 04/25/2014 3.7* 4.0 - 10.5 K/uL Final  . RBC 04/25/2014 3.56* 4.22 - 5.81 MIL/uL Final  . Hemoglobin  04/25/2014 11.5* 13.0 - 17.0 g/dL Final  . HCT 04/25/2014 33.1* 39.0 - 52.0 % Final  . MCV 04/25/2014 93.0  78.0 - 100.0 fL Final  . MCH 04/25/2014 32.3  26.0 - 34.0 pg Final  . MCHC 04/25/2014 34.7  30.0 - 36.0 g/dL Final  . RDW 04/25/2014 16.1* 11.5 - 15.5 % Final  . Platelets 04/25/2014 56* 150 - 400 K/uL Final   Comment: SPECIMEN CHECKED FOR CLOTS                          PLATELET COUNT CONFIRMED BY SMEAR  . Neutrophils Relative % 04/25/2014 49  43 - 77 % Final  . Neutro Abs 04/25/2014 1.8  1.7 - 7.7 K/uL Final  . Lymphocytes Relative 04/25/2014 34  12 - 46 % Final  . Lymphs Abs 04/25/2014 1.3  0.7 - 4.0 K/uL Final  . Monocytes Relative 04/25/2014 14* 3 - 12 % Final  . Monocytes Absolute 04/25/2014 0.5  0.1 - 1.0 K/uL Final  . Eosinophils Relative 04/25/2014 3  0 - 5 % Final  . Eosinophils Absolute 04/25/2014 0.1  0.0 - 0.7 K/uL Final  . Basophils Relative 04/25/2014 0  0 - 1 % Final  . Basophils Absolute 04/25/2014 0.0  0.0 - 0.1 K/uL Final    PATHOLOGY: No new pathology.  Urinalysis    Component Value Date/Time  COLORURINE YELLOW 02/21/2014 1055   APPEARANCEUR CLEAR 02/21/2014 1055   LABSPEC 1.010 02/21/2014 1055   PHURINE 6.0 02/21/2014 1055   GLUCOSEU NEGATIVE 02/21/2014 1055   HGBUR TRACE* 02/21/2014 1055   BILIRUBINUR NEGATIVE 02/21/2014 1055   KETONESUR NEGATIVE 02/21/2014 1055   PROTEINUR NEGATIVE 02/21/2014 1055   UROBILINOGEN 0.2 02/21/2014 1055   NITRITE NEGATIVE 02/21/2014 1055   LEUKOCYTESUR NEGATIVE 02/21/2014 1055    RADIOGRAPHIC STUDIES: Ct Abdomen Pelvis W Contrast  04/11/2014   CLINICAL DATA:  Cirrhosis.  History of hepatitis-C and diabetes.  EXAM: CT ABDOMEN AND PELVIS WITH CONTRAST  TECHNIQUE: Multidetector CT imaging of the abdomen and pelvis was performed using the standard protocol following bolus administration of intravenous contrast.  CONTRAST:  OMNIPAQUE IOHEXOL 300 MG/ML  SOLN  COMPARISON:  Abdominal pelvic CT 07/22/2013.  FINDINGS: Lung bases:  Focal emphysematous changes in the right middle lobe are stable. The lung bases are otherwise clear. There is no significant pleural or pericardial effusion. A small hiatal hernia is noted.  Liver/Biliary/Pancreas: Diffuse changes of hepatic cirrhosis are again noted, slightly progressive. The contrast bolus is suboptimal. No focally enhancing lesions are identified. There are multiple left upper quadrant abdominal varices. Small calcified gallstones are noted. There is no gallbladder wall thickening or biliary dilatation. The pancreas appears normal.  Spleen/Adrenal glands: Moderate splenomegaly is similar to the prior study. The spleen measures 19.8 x 14.3 x 19.8 cm. No focal splenic lesions are identified. The adrenal glands appear normal.  Kidneys/Ureters/Bladder: There is a small nonobstructing calculus in the lower pole of the left kidney. Both kidneys otherwise appear normal. There is no evidence of hydronephrosis. The bladder appears normal.  Bowel/Peritoneum: The stomach, small bowel, appendix and colon demonstrate no significant findings. A small amount of ascites is similar to the prior study.  Retroperitoneum/Pelvis: Prominent lymph nodes within the porta hepatis are grossly stable. There is no retroperitoneal lymphadenopathy. No significant arterial abnormalities are demonstrated.  Abdominal wall: There are large gonadal vein varices within the left inguinal canal. No focal hernia demonstrated.  Musculoskeletal: No acute or significant osseus findings.  IMPRESSION: 1. Redemonstrated are changes of cirrhosis (slightly progressive) with portal hypertension manifesting as splenomegaly, multiple varices and ascites. 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 2. No focal hepatic lesions identified. The contrast bolus is suboptimal. 3. No acute findings. 4. Stable additional findings including cholelithiasis and nonobstructive nephrolithiasis on the left.   Electronically Signed   By: Roxy Horseman M.D.   On: 04/11/2014 17:18    ASSESSMENT:  .#1. 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  #2. Right quadrant bowel pain probably secondary to stretching of the liver capsule and cholelithiasis.,, No evidence of hepatocellular carcinoma transformation.  #3. History of iron deficiency, awaiting today's ferritin and soluble transferrin receptor report, status post Feraheme on 02/24/2014 and 03/03/2014.      PLAN:  #1. Proceed with definitive therapy for hepatitis C, patient was told to call with the name of the physician caring for him as well as the name of the medications that he is taking. #2. Oxycodone 10 mg up to 6 per 24 hours to control pain. #3. If iron deficiency is found, additional intravenous iron will be administered. #4. Followup in 2 months with CBC, chem profile, ferritin, and soluble transferrin receptor.   All questions  were answered. The patient knows to call the clinic with any problems, questions or concerns. We can certainly see the patient much sooner if necessary.   I spent 30 minutes counseling the patient face to face. The total time spent in the appointment was 40 minutes.    Doroteo Bradford, MD 04/25/2014 11:47 AM  DISCLAIMER:  This note was dictated with voice recognition software.  Similar sounding words can inadvertently be transcribed inaccurately and may not be corrected upon review.

## 2014-04-25 NOTE — Patient Instructions (Addendum)
[place] Discharge Instructions  RECOMMENDATIONS MADE BY THE CONSULTANT AND ANY TEST RESULTS WILL BE SENT TO YOUR REFERRING PHYSICIAN.  We will see you in 2 months for repeat lab work and a [place] appointment. Please call for any questions or concerns. Prescription given for oxycodone.   Thank you for choosing [facility] to provide your oncology and hematology care.  To afford each patient quality time with our providers, please arrive at least 15 minutes before your scheduled appointment time.  With your help, our goal is to use those 15 minutes to complete the necessary work-up to ensure our physicians have the information they need to help with your evaluation and healthcare recommendations.    Effective January 1st, 2014, we ask that you re-schedule your appointment with our physicians should you arrive 10 or more minutes late for your appointment.  We strive to give you quality time with our providers, and arriving late affects you and other patients whose appointments are after yours.    Again, thank you for choosing [facility].  Our hope is that these requests will decrease the amount of time that you wait before being seen by our physicians.       _____________________________________________________________  Should you have questions after your visit to [facility], please contact our office at [phone] between the hours of 8:30 a.m. and 4:30 p.m.  Voicemails left after 4:30 p.m. will not be returned until the following business day.  For prescription refill requests, have your pharmacy contact our office with your prescription refill request.    _______________________________________________________________  We hope that we have given you very good care.  You may receive a patient satisfaction survey in the mail, please complete it and return it as soon as possible.  We value your  feedback!  _______________________________________________________________  Have you asked about our STAR program?  STAR stands for Survivorship Training and Rehabilitation, and this is a nationally recognized cancer care program that focuses on survivorship and rehabilitation.  Cancer and cancer treatments may cause problems, such as, pain, making you feel tired and keeping you from doing the things that you need or want to do. Cancer rehabilitation can help. Our goal is to reduce these troubling effects and help you have the best quality of life possible.  You may receive a survey from a nurse that asks questions about your current state of health.  Based on the survey results, all eligible patients will be referred to the [facility] program for an evaluation so we can better serve you!  A frequently asked questions sheet is available upon request.

## 2014-04-25 NOTE — Progress Notes (Signed)
LABS FOR SOTRAN,FERR,CBCD

## 2014-04-26 ENCOUNTER — Telehealth (HOSPITAL_COMMUNITY): Payer: Self-pay | Admitting: Hematology and Oncology

## 2014-04-28 LAB — FERRITIN: FERRITIN: 213 ng/mL (ref 22–322)

## 2014-05-01 LAB — MISCELLANEOUS TEST

## 2014-05-09 ENCOUNTER — Telehealth (HOSPITAL_COMMUNITY): Payer: Self-pay | Admitting: *Deleted

## 2014-05-09 ENCOUNTER — Other Ambulatory Visit (HOSPITAL_COMMUNITY): Payer: Self-pay | Admitting: Hematology and Oncology

## 2014-05-09 DIAGNOSIS — R109 Unspecified abdominal pain: Secondary | ICD-10-CM

## 2014-05-09 MED ORDER — OXYCODONE HCL 10 MG PO TABS: ORAL_TABLET | ORAL | Status: DC

## 2014-05-11 LAB — SOLUBLE TRANSFERRIN RECEPTOR

## 2014-05-24 ENCOUNTER — Other Ambulatory Visit (HOSPITAL_COMMUNITY): Payer: Self-pay | Admitting: Hematology and Oncology

## 2014-05-24 ENCOUNTER — Telehealth (HOSPITAL_COMMUNITY): Payer: Self-pay | Admitting: *Deleted

## 2014-05-24 DIAGNOSIS — R109 Unspecified abdominal pain: Secondary | ICD-10-CM

## 2014-05-24 MED ORDER — OXYCODONE HCL 10 MG PO TABS: ORAL_TABLET | ORAL | Status: DC

## 2014-06-02 ENCOUNTER — Ambulatory Visit: Payer: Medicare Other | Admitting: Family Medicine

## 2014-06-09 ENCOUNTER — Other Ambulatory Visit (HOSPITAL_COMMUNITY): Payer: Self-pay | Admitting: Hematology and Oncology

## 2014-06-09 ENCOUNTER — Telehealth (HOSPITAL_COMMUNITY): Payer: Self-pay | Admitting: *Deleted

## 2014-06-09 DIAGNOSIS — R109 Unspecified abdominal pain: Secondary | ICD-10-CM

## 2014-06-09 MED ORDER — OXYCODONE HCL 10 MG PO TABS: ORAL_TABLET | ORAL | Status: DC

## 2014-06-21 ENCOUNTER — Other Ambulatory Visit (HOSPITAL_COMMUNITY): Payer: Self-pay | Admitting: Hematology and Oncology

## 2014-06-21 ENCOUNTER — Telehealth (HOSPITAL_COMMUNITY): Payer: Self-pay | Admitting: *Deleted

## 2014-06-21 DIAGNOSIS — R109 Unspecified abdominal pain: Secondary | ICD-10-CM

## 2014-06-21 MED ORDER — OXYCODONE HCL 10 MG PO TABS: ORAL_TABLET | ORAL | Status: DC

## 2014-06-26 ENCOUNTER — Ambulatory Visit (HOSPITAL_COMMUNITY): Payer: Medicare Other

## 2014-06-26 ENCOUNTER — Other Ambulatory Visit (HOSPITAL_COMMUNITY): Payer: Medicare Other

## 2014-06-27 ENCOUNTER — Encounter (HOSPITAL_COMMUNITY): Payer: Self-pay | Admitting: Oncology

## 2014-06-27 ENCOUNTER — Encounter (HOSPITAL_BASED_OUTPATIENT_CLINIC_OR_DEPARTMENT_OTHER): Payer: Medicare Other

## 2014-06-27 ENCOUNTER — Encounter (HOSPITAL_COMMUNITY): Payer: Medicare Other | Attending: Hematology and Oncology | Admitting: Oncology

## 2014-06-27 VITALS — BP 135/57 | HR 67 | Temp 97.8°F | Resp 18 | Wt 286.0 lb

## 2014-06-27 DIAGNOSIS — D731 Hypersplenism: Secondary | ICD-10-CM | POA: Insufficient documentation

## 2014-06-27 DIAGNOSIS — K7460 Unspecified cirrhosis of liver: Secondary | ICD-10-CM

## 2014-06-27 DIAGNOSIS — D509 Iron deficiency anemia, unspecified: Secondary | ICD-10-CM

## 2014-06-27 DIAGNOSIS — D61818 Other pancytopenia: Secondary | ICD-10-CM | POA: Diagnosis not present

## 2014-06-27 DIAGNOSIS — B182 Chronic viral hepatitis C: Secondary | ICD-10-CM

## 2014-06-27 DIAGNOSIS — K5520 Angiodysplasia of colon without hemorrhage: Secondary | ICD-10-CM

## 2014-06-27 DIAGNOSIS — R188 Other ascites: Secondary | ICD-10-CM

## 2014-06-27 DIAGNOSIS — R109 Unspecified abdominal pain: Secondary | ICD-10-CM

## 2014-06-27 LAB — COMPREHENSIVE METABOLIC PANEL
ALBUMIN: 3.2 g/dL — AB (ref 3.5–5.2)
ALK PHOS: 98 U/L (ref 39–117)
ALT: 28 U/L (ref 0–53)
AST: 55 U/L — ABNORMAL HIGH (ref 0–37)
Anion gap: 12 (ref 5–15)
BILIRUBIN TOTAL: 2.5 mg/dL — AB (ref 0.3–1.2)
BUN: 15 mg/dL (ref 6–23)
CHLORIDE: 107 meq/L (ref 96–112)
CO2: 21 mEq/L (ref 19–32)
Calcium: 8.3 mg/dL — ABNORMAL LOW (ref 8.4–10.5)
Creatinine, Ser: 0.76 mg/dL (ref 0.50–1.35)
GFR calc Af Amer: >90 mL/min (ref >90)
GFR calc non Af Amer: >90 mL/min (ref >90)
Glucose, Bld: 180 mg/dL — ABNORMAL HIGH (ref 70–99)
Potassium: 3.7 mEq/L (ref 3.7–5.3)
SODIUM: 140 meq/L (ref 137–147)
Total Protein: 7.3 g/dL (ref 6.0–8.3)

## 2014-06-27 LAB — CBC WITH DIFFERENTIAL/PLATELET
BASOS ABS: 0 10*3/uL (ref 0.0–0.1)
BASOS PCT: 0 % (ref 0–1)
Eosinophils Absolute: 0.1 10*3/uL (ref 0.0–0.7)
Eosinophils Relative: 3 % (ref 0–5)
HEMATOCRIT: 31.6 % — AB (ref 39.0–52.0)
HEMOGLOBIN: 10.6 g/dL — AB (ref 13.0–17.0)
LYMPHS PCT: 31 % (ref 12–46)
Lymphs Abs: 0.9 10*3/uL (ref 0.7–4.0)
MCH: 32 pg (ref 26.0–34.0)
MCHC: 33.5 g/dL (ref 30.0–36.0)
MCV: 95.5 fL (ref 78.0–100.0)
MONOS PCT: 10 % (ref 3–12)
Monocytes Absolute: 0.3 10*3/uL (ref 0.1–1.0)
NEUTROS ABS: 1.6 10*3/uL — AB (ref 1.7–7.7)
NEUTROS PCT: 56 % (ref 43–77)
Platelets: 54 10*3/uL — ABNORMAL LOW (ref 150–400)
RBC: 3.31 MIL/uL — ABNORMAL LOW (ref 4.22–5.81)
RDW: 14 % (ref 11.5–15.5)
WBC: 2.9 10*3/uL — AB (ref 4.0–10.5)

## 2014-06-27 LAB — FERRITIN: Ferritin: 183 ng/mL (ref 22–322)

## 2014-06-27 NOTE — Progress Notes (Signed)
Labs for sotran,cbcd,cmp,ferr

## 2014-06-27 NOTE — Progress Notes (Signed)
[name], [place] [id number]  Other pancytopenia - Plan: CBC with Differential  Iron (Fe) deficiency anemia - Plan: CBC with Differential, Iron and TIBC, Ferritin, Soluble transferrin receptor  Angiodysplasia of colon  Chronic hepatitis C without hepatic coma  Cirrhosis of liver with ascites, unspecified hepatic cirrhosis type  CURRENT THERAPY: IV Feraheme last dose given on 02/24/2014 and 03/03/2014 and pain management by Dr. Barnet Glasgow  INTERVAL HISTORY: [name] 53 y.o. 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  His cirrhosis of non-alcohol-related and he has been evaluated in [place], [place] for possible transplant. There is a history of left nephrolithiasis as well as cholelithiasis. Due to his severe underlying liver disease, cholecystectomy has not been done.   I personally reviewed and went over laboratory results with the patient.  The results are noted within this dictation.  His Oxycodone was refilled on 06/21/2014 by Dr. Barnet Glasgow.  He reports that his pain medication is helping significantly with his pain and I believe he is compliant with the medication.  I question our role in providing the medication as this is a GI issue, not a hematologic issue.  He notes that he is on antiviral medications for Hep C, Daklinza, Rebetol, and Sovaldi.  Compliance is encouraged.  He notes bilateral ankle and pedal edema.  He is on lasix and aldactone and I will defer any changes of these medication to the prescribing provider.  I have encouraged conservative treatment with LE elevation when able.  He notes that the edema id dependent and is better in the AM and progressively worsens by PM.    Hematologically, he denies any complaints and ROS questioning is negative  Past Medical History  Diagnosis Date  . Hypertension   . Diabetes mellitus     . GERD (gastroesophageal reflux disease)     DEC 2010 EGD/Bx REACTIVE GASTROPATHY, NO VARICES  . Hemorrhoids, internal   . BMI 40.0-44.9, adult OCT 2010 269 LBS    APR 2012 279 LBS AUG 2014 185 LBS  . 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 IV drug abuse REMOTE  . Hepatitis 2010 HEP C    AST 509 ALT 267 ALK PHOS 165 ALB 3.8 NEG IGM HAV/HBSAg  . Gallstone AUG 2012 1 CM  . GERD 10/25/2009  . COPD (chronic obstructive pulmonary disease)   . Hepatitis C   . Pancytopenia 2013  . Splenomegaly 2013  . Other pancytopenia 02/18/2013  . Iron (Fe) deficiency anemia 02/18/2013  . Splenomegaly 02/18/2013  . Neuropathy   . Biliary dyskinesia JUL 2015    HIDA GB EF 5%    has Chronic hepatitis C virus infection; DM; ALCOHOL ABUSE; GERD; Hepatic cirrhosis; UNSPECIFIED DISEASE OF PANCREAS; ABDOMINAL PAIN, CHRONIC; Colon cancer screening; Other pancytopenia; Iron (Fe) deficiency anemia; Hematochezia; Angiodysplasia of colon; Sepsis; and Encephalopathy, hepatic on his problem list.     has No Known Allergies.  [name] had no medications administered during this visit.  Past Surgical History  Procedure Laterality Date  . Sigmoidoscopy      2001 DR. FLEISCHMAN INTERNAL HERMORRHOIDS  . Upper gastrointestinal endoscopy  DEC 2010    BENIGN POLYPS, GASTRITIS, ?phg  . Knee surgery  RIGHT  . Hemorrhoid surgery    . Esophageal biopsy  09/08/2011    [name]:Moderate gastritis/Polyps, multiple in the body  of the stomach  . Colonoscopy with propofol N/A 11/15/2013    [name]: rectal varices, small AVMs  . Esophagogastroduodenoscopy (egd) with propofol N/A 11/15/2013    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 Next EGD in April 2016.      Denies any headaches, dizziness, double vision, fevers, chills, night sweats, nausea, vomiting, diarrhea, constipation, chest pain, heart palpitations, shortness of breath, blood in stool,  black tarry stool, urinary pain, urinary burning, urinary frequency, hematuria.   PHYSICAL EXAMINATION  ECOG PERFORMANCE STATUS: 1 - Symptomatic but completely ambulatory  Filed Vitals:   06/27/14 1124  BP: 135/57  Pulse: 67  Temp: 97.8 F (36.6 C)  Resp: 18    GENERAL:alert, no distress, well nourished, well developed, cooperative, obese, and chronically-ill appearing SKIN: skin color, texture, turgor are normal, no rashes or significant lesions HEAD: Normocephalic, No masses, lesions, tenderness or abnormalities EYES: normal, PERRLA, EOMI, Conjunctiva are pink and non-injected EARS: External ears normal OROPHARYNX:lips, buccal mucosa, and tongue normal and mucous membranes are moist  NECK: supple, no adenopathy, trachea midline LYMPH:  no palpable lymphadenopathy BREAST:not examined LUNGS: clear to auscultation  HEART: regular rate & rhythm, no gallops and 1/6 systolic ejection murmur heard best at the 2nd ICS LSB. ABDOMEN:abdomen soft, obese, normal bowel sounds and RUQ tenderness to palpation BACK: Back symmetric, no curvature. EXTREMITIES:less then 2 second capillary refill, no joint deformities, effusion, or inflammation, no skin discoloration, no cyanosis, positive findings:  edema bilateral ankle edema, 2+ pitting  NEURO: alert & oriented x 3 with fluent speech, no focal motor/sensory deficits, gait normal, saddened affect   LABORATORY DATA: CBC    Component Value Date/Time   WBC 3.7* 04/25/2014 1031   RBC 3.56* 04/25/2014 1031   HGB 11.5* 04/25/2014 1031   HCT 33.1* 04/25/2014 1031   PLT 56* 04/25/2014 1031   MCV 93.0 04/25/2014 1031   MCH 32.3 04/25/2014 1031   MCHC 34.7 04/25/2014 1031   RDW 16.1* 04/25/2014 1031   LYMPHSABS 1.3 04/25/2014 1031   MONOABS 0.5 04/25/2014 1031   EOSABS 0.1 04/25/2014 1031   BASOSABS 0.0 04/25/2014 1031      Chemistry      Component Value Date/Time   NA 140 06/27/2014 1117   NA 135 03/01/2014 1413   K 3.7 06/27/2014 1117    CL 107 06/27/2014 1117   CO2 21 06/27/2014 1117   BUN 15 06/27/2014 1117   BUN 11 03/01/2014 1413   CREATININE 0.76 06/27/2014 1117   CREATININE 0.67 07/19/2012 0823      Component Value Date/Time   CALCIUM 8.3* 06/27/2014 1117   ALKPHOS 98 06/27/2014 1117   AST 55* 06/27/2014 1117   ALT 28 06/27/2014 1117   BILITOT 2.5* 06/27/2014 1117     Lab Results  Component Value Date   IRON 80 06/29/2013   TIBC 305 06/29/2013   FERRITIN 213 04/25/2014     ASSESSMENT:  1. 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  2. Right quadrant bowel pain probably secondary to stretching of the liver capsule and cholelithiasis. No evidence of hepatocellular carcinoma transformation. 3. History of iron deficiency, status post Feraheme on 02/24/2014 and 03/03/2014.  Patient Active Problem List   Diagnosis Date Noted  . Sepsis 01/29/2014  . Encephalopathy, hepatic 01/29/2014  .  Angiodysplasia of colon 12/27/2013  . Hematochezia 11/03/2013  . Other pancytopenia 02/18/2013  . Iron (Fe) deficiency anemia 02/18/2013  . Colon cancer screening 08/28/2011  . ABDOMINAL PAIN, CHRONIC 09/05/2010  . DM 02/15/2010  . GERD 10/25/2009  . Hepatic cirrhosis 10/25/2009  . Chronic hepatitis C virus infection 06/05/2009  . ALCOHOL ABUSE 06/05/2009  . UNSPECIFIED DISEASE OF PANCREAS 06/05/2009    PLAN:  1. I personally reviewed and went over laboratory results with the patient.  The results are noted within this dictation. 2. Labs today as ordered: CBC diff, CMET, Ferritin, soluble transferrin receptor 3. Oxycodone refilled by Dr. Barnet Glasgow on 06/21/2014 4. Discussion with Dr. Barnet Glasgow confirms the cause of pain management.  Per Dr. Barnet Glasgow, the patient has painful gallstones and given his current clinical situation, he is not  a candidate for surgery at this time.   5. Labs in 2 months: CBC diff, iron/TIBC, Ferritin, soluble transferrin receptor 6. Return in 2 months for follow-up   THERAPY PLAN:  We will continue to monitor counts and support iron levels as needed.  All questions were answered. The patient knows to call the clinic with any problems, questions or concerns. We can certainly see the patient much sooner if necessary.  Patient and plan discussed with Dr. Farrel Gobble and he is in agreement with the aforementioned.   KEFALAS,THOMAS 06/27/2014

## 2014-06-27 NOTE — Patient Instructions (Signed)
[place] Discharge Instructions  RECOMMENDATIONS MADE BY THE CONSULTANT AND ANY TEST RESULTS WILL BE SENT TO YOUR REFERRING PHYSICIAN.  We performed labs today: CBC diff, iron/TIBC, ferritin, soluble transferrin receptor. We will call you with results. Continue to keep your legs elevated when able to help with swelling. We will perform labs in 2 months and see you back in 2 months. Continue to follow-up with GI physician.  Thank you for choosing [facility] to provide your oncology and hematology care.  To afford each patient quality time with our providers, please arrive at least 15 minutes before your scheduled appointment time.  With your help, our goal is to use those 15 minutes to complete the necessary work-up to ensure our physicians have the information they need to help with your evaluation and healthcare recommendations.    Effective January 1st, 2014, we ask that you re-schedule your appointment with our physicians should you arrive 10 or more minutes late for your appointment.  We strive to give you quality time with our providers, and arriving late affects you and other patients whose appointments are after yours.    Again, thank you for choosing [facility].  Our hope is that these requests will decrease the amount of time that you wait before being seen by our physicians.       _____________________________________________________________  Should you have questions after your visit to [facility] please contact our office at [phone] between the hours of 8:30 a.m. and 5:00 p.m.  Voicemails left after 4:30 p.m. will not be returned until the following business day.  For prescription refill requests, have your pharmacy contact our office with your prescription refill request.

## 2014-06-30 ENCOUNTER — Emergency Department (HOSPITAL_COMMUNITY): Payer: Medicare Other

## 2014-06-30 ENCOUNTER — Encounter (HOSPITAL_COMMUNITY): Payer: Self-pay | Admitting: *Deleted

## 2014-06-30 ENCOUNTER — Emergency Department (HOSPITAL_COMMUNITY)
Admission: EM | Admit: 2014-06-30 | Discharge: 2014-06-30 | Disposition: A | Discharge status: Home / Self Care | Payer: Medicare Other | Attending: Emergency Medicine | Admitting: Emergency Medicine

## 2014-06-30 DIAGNOSIS — E119 Type 2 diabetes mellitus without complications: Secondary | ICD-10-CM | POA: Insufficient documentation

## 2014-06-30 DIAGNOSIS — Z862 Personal history of diseases of the blood and blood-forming organs and certain disorders involving the immune mechanism: Secondary | ICD-10-CM | POA: Diagnosis not present

## 2014-06-30 DIAGNOSIS — I10 Essential (primary) hypertension: Secondary | ICD-10-CM | POA: Insufficient documentation

## 2014-06-30 DIAGNOSIS — J449 Chronic obstructive pulmonary disease, unspecified: Secondary | ICD-10-CM | POA: Diagnosis not present

## 2014-06-30 DIAGNOSIS — Z794 Long term (current) use of insulin: Secondary | ICD-10-CM | POA: Insufficient documentation

## 2014-06-30 DIAGNOSIS — L03116 Cellulitis of left lower limb: Secondary | ICD-10-CM

## 2014-06-30 DIAGNOSIS — Z8619 Personal history of other infectious and parasitic diseases: Secondary | ICD-10-CM | POA: Insufficient documentation

## 2014-06-30 DIAGNOSIS — G629 Polyneuropathy, unspecified: Secondary | ICD-10-CM | POA: Insufficient documentation

## 2014-06-30 DIAGNOSIS — K219 Gastro-esophageal reflux disease without esophagitis: Secondary | ICD-10-CM | POA: Insufficient documentation

## 2014-06-30 DIAGNOSIS — Z79899 Other long term (current) drug therapy: Secondary | ICD-10-CM | POA: Insufficient documentation

## 2014-06-30 DIAGNOSIS — G5772 Causalgia of left lower limb: Secondary | ICD-10-CM | POA: Diagnosis present

## 2014-06-30 DIAGNOSIS — R52 Pain, unspecified: Secondary | ICD-10-CM

## 2014-06-30 LAB — COMPREHENSIVE METABOLIC PANEL
ALBUMIN: 2.8 g/dL — AB (ref 3.5–5.2)
ALK PHOS: 100 U/L (ref 39–117)
ALT: 22 U/L (ref 0–53)
AST: 37 U/L (ref 0–37)
Anion gap: 10 (ref 5–15)
BILIRUBIN TOTAL: 1.8 mg/dL — AB (ref 0.3–1.2)
BUN: 16 mg/dL (ref 6–23)
CHLORIDE: 107 meq/L (ref 96–112)
CO2: 22 mEq/L (ref 19–32)
Calcium: 8.5 mg/dL (ref 8.4–10.5)
Creatinine, Ser: 0.84 mg/dL (ref 0.50–1.35)
GFR calc non Af Amer: >90 mL/min (ref >90)
GLUCOSE: 246 mg/dL — AB (ref 70–99)
Potassium: 4.4 mEq/L (ref 3.7–5.3)
SODIUM: 139 meq/L (ref 137–147)
Total Protein: 6.8 g/dL (ref 6.0–8.3)

## 2014-06-30 LAB — CBC WITH DIFFERENTIAL/PLATELET
BASOS PCT: 0 % (ref 0–1)
Basophils Absolute: 0 10*3/uL (ref 0.0–0.1)
EOS ABS: 0.1 10*3/uL (ref 0.0–0.7)
Eosinophils Relative: 2 % (ref 0–5)
HEMATOCRIT: 30.1 % — AB (ref 39.0–52.0)
Hemoglobin: 10.1 g/dL — ABNORMAL LOW (ref 13.0–17.0)
Lymphocytes Relative: 24 % (ref 12–46)
Lymphs Abs: 0.8 10*3/uL (ref 0.7–4.0)
MCH: 32.5 pg (ref 26.0–34.0)
MCHC: 33.6 g/dL (ref 30.0–36.0)
MCV: 96.8 fL (ref 78.0–100.0)
MONO ABS: 0.3 10*3/uL (ref 0.1–1.0)
MONOS PCT: 11 % (ref 3–12)
NEUTROS ABS: 1.9 10*3/uL (ref 1.7–7.7)
Neutrophils Relative %: 62 % (ref 43–77)
Platelets: 52 10*3/uL — ABNORMAL LOW (ref 150–400)
RBC: 3.11 MIL/uL — ABNORMAL LOW (ref 4.22–5.81)
RDW: 14.2 % (ref 11.5–15.5)
WBC: 3.1 10*3/uL — ABNORMAL LOW (ref 4.0–10.5)

## 2014-06-30 LAB — SOLUBLE TRANSFERRIN RECEPTOR: Transferrin Receptor, Soluble: 1.22 mg/L (ref 0.76–1.76)

## 2014-06-30 MED ORDER — CEPHALEXIN 500 MG PO CAPS: 500.0000 mg | ORAL_CAPSULE | Freq: Four times a day (QID) | ORAL | Status: DC

## 2014-06-30 MED ORDER — LIDOCAINE HCL (PF) 1 % IJ SOLN
INTRAMUSCULAR | Status: AC
  Filled 2014-06-30: qty 5

## 2014-06-30 MED ORDER — CEFTRIAXONE SODIUM 1 G IJ SOLR
1.0000 g | Freq: Once | INTRAMUSCULAR | Status: AC
  Administered 2014-06-30: 1 g via INTRAMUSCULAR
  Filled 2014-06-30: qty 10

## 2014-06-30 MED ORDER — OXYCODONE HCL 10 MG PO TABS: 10.0000 mg | ORAL_TABLET | Freq: Three times a day (TID) | ORAL | Status: DC | PRN

## 2014-06-30 MED ORDER — OXYCODONE HCL ER 10 MG PO T12A
10.0000 mg | EXTENDED_RELEASE_TABLET | Freq: Two times a day (BID) | ORAL | Status: DC
  Administered 2014-06-30: 10 mg via ORAL
  Filled 2014-06-30: qty 1

## 2014-06-30 MED ORDER — HYDROMORPHONE HCL 2 MG/ML IJ SOLN
2.0000 mg | Freq: Once | INTRAMUSCULAR | Status: AC
  Administered 2014-06-30: 2 mg via INTRAMUSCULAR
  Filled 2014-06-30: qty 1

## 2014-06-30 NOTE — Discharge Instructions (Signed)
Keflex as prescribed.  Oxycodone as prescribed as needed for pain.  Return to the emergency department if your symptoms substantially worsen or change. Follow-up with your Dr. if not improving in the next several days.   Cellulitis Cellulitis is an infection of the skin and the tissue beneath it. The infected area is usually red and tender. Cellulitis occurs most often in the arms and lower legs.  CAUSES  Cellulitis is caused by bacteria that enter the skin through cracks or cuts in the skin. The most common types of bacteria that cause cellulitis are staphylococci and streptococci. SIGNS AND SYMPTOMS   Redness and warmth.  Swelling.  Tenderness or pain.  Fever. DIAGNOSIS  Your health care provider can usually determine what is wrong based on a physical exam. Blood tests may also be done. TREATMENT  Treatment usually involves taking an antibiotic medicine. HOME CARE INSTRUCTIONS   Take your antibiotic medicine as directed by your health care provider. Finish the antibiotic even if you start to feel better.  Keep the infected arm or leg elevated to reduce swelling.  Apply a warm cloth to the affected area up to 4 times per day to relieve pain.  Take medicines only as directed by your health care provider.  Keep all follow-up visits as directed by your health care provider. SEEK MEDICAL CARE IF:   You notice red streaks coming from the infected area.  Your red area gets larger or turns dark in color.  Your bone or joint underneath the infected area becomes painful after the skin has healed.  Your infection returns in the same area or another area.  You notice a swollen bump in the infected area.  You develop new symptoms.  You have a fever. SEEK IMMEDIATE MEDICAL CARE IF:   You feel very sleepy.  You develop vomiting or diarrhea.  You have a general ill feeling (malaise) with muscle aches and pains. MAKE SURE YOU:   Understand these instructions.  Will watch  your condition.  Will get help right away if you are not doing well or get worse. Document Released: 05/07/2005 Document Revised: 12/12/2013 Document Reviewed: 10/13/2011 [facility] Patient Information 2015 [place], [place]. This information is not intended to replace advice given to you by your health care provider. Make sure you discuss any questions you have with your health care provider.

## 2014-06-30 NOTE — ED Provider Notes (Signed)
CSN: [id number]     Arrival date & time 06/30/14  1502 History   First MD Initiated Contact with Patient 06/30/14 1523     Chief Complaint  Patient presents with  . Leg Pain     (Consider location/radiation/quality/duration/timing/severity/associated sxs/prior Treatment) HPI Comments: Patient is a 53 year old male with history of cirrhosis, diabetes, and hypertension. He presents today with complaints of severe pain in his left ankle in the absence of any injury or trauma. He has several reddened areas to the inside of the lower leg. He denies any fevers or chills. His pain is worse with ambulation and there are no alleviating factors.  Patient is a 53 y.o. male presenting with leg pain. The history is provided by the patient.  Leg Pain Location:  Ankle Time since incident:  3 days Ankle location:  L ankle Pain details:    Quality:  Throbbing   Radiates to:  Does not radiate   Severity:  Severe   Onset quality:  Gradual   Duration:  3 days   Timing:  Constant   Progression:  Worsening Chronicity:  New Dislocation: no   Prior injury to area:  No Relieved by:  Nothing Worsened by:  Activity and bearing weight Ineffective treatments:  None tried Associated symptoms: no fatigue and no fever     Past Medical History  Diagnosis Date  . Hypertension   . Diabetes mellitus   . GERD (gastroesophageal reflux disease)     DEC 2010 EGD/Bx REACTIVE GASTROPATHY, NO VARICES  . Hemorrhoids, internal   . BMI 40.0-44.9, adult OCT 2010 269 LBS    APR 2012 279 LBS AUG 2014 185 LBS  . 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 IV drug abuse REMOTE  . Hepatitis 2010 HEP C    AST 509 ALT 267 ALK PHOS 165 ALB 3.8 NEG IGM HAV/HBSAg  . Gallstone AUG 2012 1 CM  . GERD 10/25/2009  . COPD (chronic obstructive pulmonary disease)   . Hepatitis C   . Pancytopenia 2013  . Splenomegaly 2013  . Other pancytopenia 02/18/2013  . Iron (Fe) deficiency anemia 02/18/2013  . Splenomegaly  02/18/2013  . Neuropathy   . Biliary dyskinesia JUL 2015    HIDA GB EF 5%   Past Surgical History  Procedure Laterality Date  . Sigmoidoscopy      2001 DR. FLEISCHMAN INTERNAL HERMORRHOIDS  . Upper gastrointestinal endoscopy  DEC 2010    BENIGN POLYPS, GASTRITIS, ?phg  . Knee surgery  RIGHT  . Hemorrhoid surgery    . Esophageal biopsy  09/08/2011    [name]:Moderate gastritis/Polyps, multiple in the body of the stomach  . Colonoscopy with propofol N/A 11/15/2013    [name]: rectal varices, small AVMs  . Esophagogastroduodenoscopy (egd) with propofol N/A 11/15/2013    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 Next EGD in April 2016.     Family History  Problem Relation Age of Onset  . Colon cancer Neg Hx   . Anesthesia problems Neg Hx   . Hypotension Neg Hx   . Malignant hyperthermia Neg Hx   . Pseudochol deficiency Neg Hx   . Cancer Father    History  Substance Use Topics  . Smoking status: Never Smoker   . Smokeless tobacco: Never Used  . Alcohol Use: No     Comment: 30 years ago    Review of Systems  Constitutional: Negative for  fever and fatigue.  All other systems reviewed and are negative.     Allergies  Review of patient's allergies indicates no known allergies.  Home Medications   Prior to Admission medications   Medication Sig Start Date End Date Taking? Authorizing Provider  acetaminophen (TYLENOL) 500 MG tablet Take 1,000 mg by mouth every 6 (six) hours as needed for mild pain.    Historical Provider, MD  albuterol (PROVENTIL HFA;VENTOLIN HFA) 108 (90 BASE) MCG/ACT inhaler Inhale 2 puffs into the lungs every 6 (six) hours as needed. Shortness of breath    Historical Provider, MD  DAKLINZA 60 MG TABS Take 60 mg by mouth daily. 06/07/14   Historical Provider, MD  esomeprazole (NEXIUM) 40 MG capsule Take 1 capsule (40 mg total) by mouth 2 (two) times daily before a meal. 11/17/13   Orvil Feil,  NP  furosemide (LASIX) 20 MG tablet Take 1 tablet (20 mg total) by mouth daily. 03/01/14   [name], MD  glipiZIDE (GLUCOTROL) 10 MG tablet Take 1 tablet (10 mg total) by mouth daily. 03/01/14   [name], MD  hydrOXYzine (ATARAX/VISTARIL) 10 MG tablet Take 10 mg by mouth every 4 (four) hours as needed. 06/06/14   Historical Provider, MD  insulin glargine (LANTUS) 100 UNIT/ML injection Inject 10 Units into the skin at bedtime.    Historical Provider, MD  Oxycodone HCl 10 MG TABS Take 1 or 2 tablets every 4 hours to control pain. 06/21/14   Farrel Gobble, MD  ribavirin (REBETOL) 200 MG capsule Take 200 mg by mouth 2 (two) times daily. Take 2 capsule every morning and 1 capsul every evening.  May increase to 3 caps bid if directed by MD 06/07/14   Historical Provider, MD  SOVALDI 400 MG TABS Take 400 mg by mouth daily. 06/07/14   Historical Provider, MD  spironolactone (ALDACTONE) 50 MG tablet Take 2 tablets (100 mg total) by mouth daily. 03/01/14   [name], MD   BP 161/52 mmHg  Temp(Src) 98 F (36.7 C) (Oral)  Resp 20  Ht _0  (1.702 m)  Wt 283 lb (128.368 kg)  BMI 44.31 kg/m2  SpO2 100% Physical Exam  Constitutional: He is oriented to person, place, and time. He appears well-developed and well-nourished. No distress.  HENT:  Head: Normocephalic and atraumatic.  Neck: Normal range of motion. Neck supple.  Musculoskeletal: Normal range of motion. He exhibits no edema.  The left lower leg is noted to have several erythematous, tender, round lesions of various size. They are warm to the touch, but there is no drainage or broken skin. He is very tender over the medial aspect of his ankle. He has good range of motion of the ankle. Dorsalis pedis pulse is palpable and distal motor and sensation are intact.  Neurological: He is alert and oriented to person, place, and time.  Skin: Skin is warm and dry. He is not diaphoretic.  Nursing note and vitals reviewed.   ED  Course  Procedures (including critical care time) Labs Review Labs Reviewed - No data to display  Imaging Review No results found.   EKG Interpretation None      MDM   Final diagnoses:  Pain    Patient presents with left lower leg pain that appears to be related to cellulitis. There is no evidence for DVT and x-rays reveal no evidence for fracture. He has good range of motion of the ankle joint and I highly doubt a septic arthritis.  He was given pain medication and IM ceftriaxone in the emergency department and I believe is appropriate for outpatient treatment. I will prescribe Keflex and oxycodone. He is to return as needed if his symptoms substantially worsen or change.    Veryl Speak, MD 06/30/14 [phone]

## 2014-06-30 NOTE — ED Notes (Signed)
Ultrasound at bedside

## 2014-06-30 NOTE — ED Notes (Addendum)
Pain, swelling to lt foot, ankle and lower leg for 3 days.  No known injury.

## 2014-07-05 ENCOUNTER — Other Ambulatory Visit: Payer: Self-pay | Admitting: Nurse Practitioner

## 2014-07-10 ENCOUNTER — Telehealth (HOSPITAL_COMMUNITY): Payer: Self-pay | Admitting: *Deleted

## 2014-07-10 ENCOUNTER — Other Ambulatory Visit (HOSPITAL_COMMUNITY): Payer: Self-pay | Admitting: Hematology and Oncology

## 2014-07-10 DIAGNOSIS — R109 Unspecified abdominal pain: Secondary | ICD-10-CM

## 2014-07-10 MED ORDER — OXYCODONE HCL 10 MG PO TABS: ORAL_TABLET | ORAL | Status: DC

## 2014-07-17 ENCOUNTER — Telehealth (HOSPITAL_COMMUNITY): Payer: Self-pay | Admitting: *Deleted

## 2014-07-17 ENCOUNTER — Other Ambulatory Visit (HOSPITAL_COMMUNITY): Payer: Self-pay | Admitting: Hematology and Oncology

## 2014-07-17 DIAGNOSIS — R109 Unspecified abdominal pain: Secondary | ICD-10-CM

## 2014-07-17 MED ORDER — OXYCODONE HCL 10 MG PO TABS: ORAL_TABLET | ORAL | Status: DC

## 2014-07-19 ENCOUNTER — Emergency Department (HOSPITAL_COMMUNITY): Payer: Medicare Other

## 2014-07-19 ENCOUNTER — Encounter (HOSPITAL_COMMUNITY): Payer: Self-pay

## 2014-07-19 ENCOUNTER — Emergency Department (HOSPITAL_COMMUNITY)
Admission: EM | Admit: 2014-07-19 | Discharge: 2014-07-19 | Disposition: A | Discharge status: Home / Self Care | Payer: Medicare Other | Attending: Emergency Medicine | Admitting: Emergency Medicine

## 2014-07-19 DIAGNOSIS — Z8669 Personal history of other diseases of the nervous system and sense organs: Secondary | ICD-10-CM | POA: Insufficient documentation

## 2014-07-19 DIAGNOSIS — Z872 Personal history of diseases of the skin and subcutaneous tissue: Secondary | ICD-10-CM | POA: Insufficient documentation

## 2014-07-19 DIAGNOSIS — E119 Type 2 diabetes mellitus without complications: Secondary | ICD-10-CM | POA: Insufficient documentation

## 2014-07-19 DIAGNOSIS — K8080 Other cholelithiasis without obstruction: Secondary | ICD-10-CM | POA: Diagnosis not present

## 2014-07-19 DIAGNOSIS — Z862 Personal history of diseases of the blood and blood-forming organs and certain disorders involving the immune mechanism: Secondary | ICD-10-CM | POA: Insufficient documentation

## 2014-07-19 DIAGNOSIS — Z8659 Personal history of other mental and behavioral disorders: Secondary | ICD-10-CM | POA: Insufficient documentation

## 2014-07-19 DIAGNOSIS — J441 Chronic obstructive pulmonary disease with (acute) exacerbation: Secondary | ICD-10-CM | POA: Insufficient documentation

## 2014-07-19 DIAGNOSIS — K7460 Unspecified cirrhosis of liver: Secondary | ICD-10-CM | POA: Insufficient documentation

## 2014-07-19 DIAGNOSIS — K8020 Calculus of gallbladder without cholecystitis without obstruction: Secondary | ICD-10-CM

## 2014-07-19 DIAGNOSIS — I10 Essential (primary) hypertension: Secondary | ICD-10-CM | POA: Diagnosis not present

## 2014-07-19 DIAGNOSIS — G8929 Other chronic pain: Secondary | ICD-10-CM | POA: Insufficient documentation

## 2014-07-19 DIAGNOSIS — Z8619 Personal history of other infectious and parasitic diseases: Secondary | ICD-10-CM | POA: Diagnosis not present

## 2014-07-19 DIAGNOSIS — R109 Unspecified abdominal pain: Secondary | ICD-10-CM | POA: Diagnosis present

## 2014-07-19 DIAGNOSIS — R1011 Right upper quadrant pain: Secondary | ICD-10-CM

## 2014-07-19 HISTORY — DX: Other chronic pain: G89.29

## 2014-07-19 LAB — CBC WITH DIFFERENTIAL/PLATELET
Basophils Absolute: 0 10*3/uL (ref 0.0–0.1)
Basophils Relative: 0 % (ref 0–1)
Eosinophils Absolute: 0.1 10*3/uL (ref 0.0–0.7)
Eosinophils Relative: 3 % (ref 0–5)
HCT: 29.1 % — ABNORMAL LOW (ref 39.0–52.0)
Hemoglobin: 9.8 g/dL — ABNORMAL LOW (ref 13.0–17.0)
LYMPHS PCT: 26 % (ref 12–46)
Lymphs Abs: 0.8 10*3/uL (ref 0.7–4.0)
MCH: 32.2 pg (ref 26.0–34.0)
MCHC: 33.7 g/dL (ref 30.0–36.0)
MCV: 95.7 fL (ref 78.0–100.0)
MONO ABS: 0.4 10*3/uL (ref 0.1–1.0)
Monocytes Relative: 13 % — ABNORMAL HIGH (ref 3–12)
NEUTROS ABS: 1.7 10*3/uL (ref 1.7–7.7)
Neutrophils Relative %: 58 % (ref 43–77)
PLATELETS: 57 10*3/uL — AB (ref 150–400)
RBC: 3.04 MIL/uL — ABNORMAL LOW (ref 4.22–5.81)
RDW: 14.2 % (ref 11.5–15.5)
WBC: 2.9 10*3/uL — AB (ref 4.0–10.5)

## 2014-07-19 LAB — URINALYSIS, ROUTINE W REFLEX MICROSCOPIC
Glucose, UA: 250 mg/dL — AB
Ketones, ur: NEGATIVE mg/dL
Leukocytes, UA: NEGATIVE
Nitrite: NEGATIVE
PROTEIN: NEGATIVE mg/dL
Urobilinogen, UA: 4 mg/dL — ABNORMAL HIGH (ref 0.0–1.0)
pH: 5.5 (ref 5.0–8.0)

## 2014-07-19 LAB — CBG MONITORING, ED: Glucose-Capillary: 242 mg/dL — ABNORMAL HIGH (ref 70–99)

## 2014-07-19 LAB — COMPREHENSIVE METABOLIC PANEL
ALT: 20 U/L (ref 0–53)
ANION GAP: 10 (ref 5–15)
AST: 40 U/L — ABNORMAL HIGH (ref 0–37)
Albumin: 2.6 g/dL — ABNORMAL LOW (ref 3.5–5.2)
Alkaline Phosphatase: 100 U/L (ref 39–117)
BUN: 13 mg/dL (ref 6–23)
CALCIUM: 8.3 mg/dL — AB (ref 8.4–10.5)
CO2: 23 meq/L (ref 19–32)
CREATININE: 0.71 mg/dL (ref 0.50–1.35)
Chloride: 104 mEq/L (ref 96–112)
GLUCOSE: 263 mg/dL — AB (ref 70–99)
Potassium: 4.4 mEq/L (ref 3.7–5.3)
Sodium: 137 mEq/L (ref 137–147)
TOTAL PROTEIN: 6.3 g/dL (ref 6.0–8.3)
Total Bilirubin: 2 mg/dL — ABNORMAL HIGH (ref 0.3–1.2)

## 2014-07-19 LAB — URINE MICROSCOPIC-ADD ON

## 2014-07-19 LAB — PROTIME-INR
INR: 1.27 (ref 0.00–1.49)
PROTHROMBIN TIME: 16.1 s — AB (ref 11.6–15.2)

## 2014-07-19 LAB — AMMONIA: AMMONIA: 104 umol/L — AB (ref 11–60)

## 2014-07-19 MED ORDER — ONDANSETRON HCL 4 MG/2ML IJ SOLN
4.0000 mg | Freq: Once | INTRAMUSCULAR | Status: AC
  Administered 2014-07-19: 4 mg via INTRAVENOUS
  Filled 2014-07-19: qty 2

## 2014-07-19 MED ORDER — HYDROMORPHONE HCL 1 MG/ML IJ SOLN
INTRAMUSCULAR | Status: AC
  Filled 2014-07-19: qty 1

## 2014-07-19 MED ORDER — HYDROMORPHONE HCL 1 MG/ML IJ SOLN
1.0000 mg | Freq: Once | INTRAMUSCULAR | Status: AC
  Administered 2014-07-19: 1 mg via INTRAVENOUS
  Filled 2014-07-19: qty 1

## 2014-07-19 MED ORDER — HYDROMORPHONE HCL 1 MG/ML IJ SOLN
1.0000 mg | Freq: Once | INTRAMUSCULAR | Status: AC
  Administered 2014-07-19: 1 mg via INTRAVENOUS

## 2014-07-19 NOTE — ED Notes (Signed)
Roosevelt Locks, NP at [facility] in [place] called and reports that pt is coming in with N/v/d x 2 days.  Reports pt has hepatitis C and cirrhosis of the liver and is currently receiving hep c therapy.  Pt went to have lab work drawn today but reported he was too dehydrated, they were unable to draw any blood.  NP reports pts hgb was 10.2 on his last draw and is concerned about pt's renal function and anemia.  Pt c/o r sided abd pain and SOB.  Reports abd is swollen more than normal and feet.

## 2014-07-19 NOTE — ED Provider Notes (Signed)
CSN: [id number]     Arrival date & time 07/19/14  1421 History  This chart was scribed for Orpah Greek, * by Stephania Fragmin, ED Scribe. This patient was seen in room APA07/APA07 and the patient's care was started at 3:05 PM.    No chief complaint on file.  The history is provided by the patient and the spouse. No language interpreter was used.     HPI Comments: [name] is a 53 y.o. male who presents to the Emergency Department complaining of SOB that began 3-4 days ago. He complains of associated swelling in his legs following a recent bacterial infection on his left ankle and calf that was cleared with antibiotics, swelling and pain in his abdomen, some diarrhea, and, per wife, minimal coughing that seems like a cold symptom. Lying flat exacerbates his SOB. Patient has history of Hepatitis C and cirrhosis of the liver. Patient denies nausea and vomiting.     Past Medical History  Diagnosis Date  . Hypertension   . Diabetes mellitus   . GERD (gastroesophageal reflux disease)     DEC 2010 EGD/Bx REACTIVE GASTROPATHY, NO VARICES  . Hemorrhoids, internal   . BMI 40.0-44.9, adult OCT 2010 269 LBS    APR 2012 279 LBS AUG 2014 185 LBS  . 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 IV drug abuse REMOTE  . Hepatitis 2010 HEP C    AST 509 ALT 267 ALK PHOS 165 ALB 3.8 NEG IGM HAV/HBSAg  . Gallstone AUG 2012 1 CM  . GERD 10/25/2009  . COPD (chronic obstructive pulmonary disease)   . Hepatitis C   . Pancytopenia 2013  . Splenomegaly 2013  . Other pancytopenia 02/18/2013  . Iron (Fe) deficiency anemia 02/18/2013  . Splenomegaly 02/18/2013  . Neuropathy   . Biliary dyskinesia JUL 2015    HIDA GB EF 5%  . Chronic pain    Past Surgical History  Procedure Laterality Date  . Sigmoidoscopy      2001 DR. FLEISCHMAN INTERNAL HERMORRHOIDS  . Upper gastrointestinal endoscopy  DEC 2010    BENIGN POLYPS, GASTRITIS, ?phg  . Knee surgery  RIGHT  . Hemorrhoid surgery    .  Esophageal biopsy  09/08/2011    [name]:Moderate gastritis/Polyps, multiple in the body of the stomach  . Colonoscopy with propofol N/A 11/15/2013    [name]: rectal varices, small AVMs  . Esophagogastroduodenoscopy (egd) with propofol N/A 11/15/2013    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 Next EGD in April 2016.     Family History  Problem Relation Age of Onset  . Colon cancer Neg Hx   . Anesthesia problems Neg Hx   . Hypotension Neg Hx   . Malignant hyperthermia Neg Hx   . Pseudochol deficiency Neg Hx   . Cancer Father    History  Substance Use Topics  . Smoking status: Never Smoker   . Smokeless tobacco: Never Used  . Alcohol Use: No     Comment: 30 years ago    Review of Systems  Respiratory: Positive for cough and shortness of breath.   Cardiovascular: Positive for leg swelling.  Gastrointestinal: Positive for abdominal pain, diarrhea and abdominal distention. Negative for nausea and vomiting.  All other systems reviewed and are negative.     Allergies  Review of patient's allergies indicates no known allergies.  Home Medications   Prior to Admission medications  Medication Sig Start Date End Date Taking? Authorizing Provider  acetaminophen (TYLENOL) 500 MG tablet Take 1,000 mg by mouth every 6 (six) hours as needed for mild pain or headache.    Yes Historical Provider, MD  albuterol (PROVENTIL HFA;VENTOLIN HFA) 108 (90 BASE) MCG/ACT inhaler Inhale 2 puffs into the lungs every 6 (six) hours as needed. Shortness of breath   Yes Historical Provider, MD  DAKLINZA 60 MG TABS Take 60 mg by mouth every evening.  06/07/14  Yes Historical Provider, MD  esomeprazole (NEXIUM) 40 MG capsule Take 1 capsule (40 mg total) by mouth 2 (two) times daily before a meal. 11/17/13  Yes Orvil Feil, NP  furosemide (LASIX) 20 MG tablet TAKE 1 TABLET EVERY DAY 07/07/14  Yes [name], MD  glipiZIDE (GLUCOTROL) 10 MG  tablet TAKE 1 TABLET EVERY DAY 07/07/14  Yes [name], MD  hydrOXYzine (ATARAX/VISTARIL) 10 MG tablet Take 10 mg by mouth every 4 (four) hours as needed for itching.  06/06/14  Yes Historical Provider, MD  insulin glargine (LANTUS) 100 UNIT/ML injection Inject 10 Units into the skin at bedtime.   Yes Historical Provider, MD  Oxycodone HCl 10 MG TABS Take 1 tablet (10 mg total) by mouth every 8 (eight) hours as needed. Patient taking differently: Take 10-20 mg by mouth every 8 (eight) hours as needed (for pain).  06/30/14  Yes Veryl Speak, MD  ribavirin (REBETOL) 200 MG capsule Take 600 mg by mouth 2 (two) times daily.  07/03/14  Yes Historical Provider, MD  SOVALDI 400 MG TABS Take 400 mg by mouth every evening.  06/07/14  Yes Historical Provider, MD  cephALEXin (KEFLEX) 500 MG capsule Take 1 capsule (500 mg total) by mouth 4 (four) times daily. Patient not taking: Reported on 07/19/2014 06/30/14   Veryl Speak, MD  Oxycodone HCl 10 MG TABS Take 1 or 2 tablets every 4 hours to control pain. Patient not taking: Reported on 07/19/2014 07/17/14   Farrel Gobble, MD  spironolactone (ALDACTONE) 50 MG tablet Take 2 tablets (100 mg total) by mouth daily. Patient not taking: Reported on 06/30/2014 03/01/14   [name], MD   BP 154/77 mmHg  Pulse 71  Temp(Src) 99.1 F (37.3 C) (Oral)  Resp 14  Ht $R'5\' 6"'MD$  (1.676 m)  Wt 280 lb (127.007 kg)  BMI 45.21 kg/m2  SpO2 99% Physical Exam  Constitutional: He is oriented to person, place, and time. He appears well-developed and well-nourished. No distress.  HENT:  Head: Normocephalic and atraumatic.  Right Ear: Hearing normal.  Left Ear: Hearing normal.  Nose: Nose normal.  Mouth/Throat: Oropharynx is clear and moist and mucous membranes are normal.  Eyes: Conjunctivae and EOM are normal. Pupils are equal, round, and reactive to light.  Neck: Normal range of motion. Neck supple.  Cardiovascular: Normal rate, regular rhythm, S1 normal and S2  normal.  Exam reveals no gallop and no friction rub.   No murmur heard. Pulmonary/Chest: Effort normal and breath sounds normal. No respiratory distress. He exhibits no tenderness.  Slight tachypnea. Basal crackles.  Abdominal: Soft. Normal appearance and bowel sounds are normal. There is no hepatosplenomegaly. There is tenderness. There is no tenderness at McBurney's point and negative Murphy's sign. No hernia.  Diffuse abdominal tenderness.  Musculoskeletal: Normal range of motion. He exhibits edema.  Lower extremity pitting edema.  Neurological: He is alert and oriented to person, place, and time. He has normal strength. No cranial nerve deficit or sensory deficit. Coordination  normal. GCS eye subscore is 4. GCS verbal subscore is 5. GCS motor subscore is 6.  Skin: Skin is warm, dry and intact. No rash noted. No cyanosis.  Psychiatric: He has a normal mood and affect. His speech is normal and behavior is normal. Thought content normal.  Nursing note and vitals reviewed.   ED Course  Procedures (including critical care time)  DIAGNOSTIC STUDIES: Oxygen Saturation is 100% on room air, normal by my interpretation.    COORDINATION OF CARE: 3:09 PM - Discussed treatment plan with pt at bedside and pt agreed to plan.  Labs Review Labs Reviewed  COMPREHENSIVE METABOLIC PANEL - Abnormal; Notable for the following:    Glucose, Bld 263 (*)    Calcium 8.3 (*)    Albumin 2.6 (*)    AST 40 (*)    Total Bilirubin 2.0 (*)    All other components within normal limits  PROTIME-INR - Abnormal; Notable for the following:    Prothrombin Time 16.1 (*)    All other components within normal limits  CBC WITH DIFFERENTIAL - Abnormal; Notable for the following:    WBC 2.9 (*)    RBC 3.04 (*)    Hemoglobin 9.8 (*)    HCT 29.1 (*)    Platelets 57 (*)    Monocytes Relative 13 (*)    All other components within normal limits  AMMONIA - Abnormal; Notable for the following:    Ammonia 104 (*)    All  other components within normal limits  URINALYSIS, ROUTINE W REFLEX MICROSCOPIC - Abnormal; Notable for the following:    APPearance HAZY (*)    Specific Gravity, Urine >1.030 (*)    Glucose, UA 250 (*)    Hgb urine dipstick SMALL (*)    Bilirubin Urine SMALL (*)    Urobilinogen, UA 4.0 (*)    All other components within normal limits  URINE MICROSCOPIC-ADD ON - Abnormal; Notable for the following:    Crystals CA OXALATE CRYSTALS (*)    All other components within normal limits  CBG MONITORING, ED - Abnormal; Notable for the following:    Glucose-Capillary 242 (*)    All other components within normal limits    Imaging Review US Abdomen Complete  07/19/2014   CLINICAL DATA:  Right upper quadrant pain. Cirrhosis. Cholelithiasis. Hepatitis.  EXAM: ULTRASOUND ABDOMEN COMPLETE  COMPARISON:  CT scan dated 04/11/2014 and ultrasound dated 01/30/2014  FINDINGS: Gallbladder: There is sludge and stones in the gallbladder. Negative sonographic Murphy's sign. Gallbladder wall is minimally thickened at 2.6 mm.  Common bile duct: Diameter: Normal.  5.1 mm in diameter.  Liver: The liver is nodular and coarsely echogenic without focal lesions. No dilated intrahepatic bile ducts. Normal flow in the portal vein.  IVC: No abnormality visualized.  Pancreas: Not visualized.  Spleen: Splenomegaly, 20.8 cm in length.  Volume is 1678 cubic cm.  Right Kidney: Length: 12.2 cm. Echogenicity within normal limits. No mass or hydronephrosis visualized.  Left Kidney: Length: 13.8 cm. Echogenicity within normal limits. No mass or hydronephrosis visualized.  Abdominal aorta: No aneurysm visualized.  Other findings: Small amount of ascites.  IMPRESSION: Cholelithiasis.  Cirrhosis.  Splenomegaly.  No acute findings.   Electronically Signed   By: Rozetta Nunnery M.D.   On: 07/19/2014 18:56     EKG Interpretation None      MDM   Final diagnoses:  RUQ pain  Cirrhosis  Cholelithiasis   Since to the ER for evaluation of pain  and swelling.  Patient reports a history of cirrhosis of his liver secondary to hepatitis C. He was sent to the ER by his liver doctor. His workup today has been unremarkable. Patient has a known gallstone, felt to be a poor operative candidate. Ultrasound was performed and did not show any evidence of acute cholecystitis. He also does not have any significant ascites. Patient provided analgesia in the ER with improvement. He'll be discharged, follow-up with his doctor.  I personally performed the services described in this documentation, which was scribed in my presence. The recorded information has been reviewed and is accurate.    Orpah Greek, MD 07/20/14 2223

## 2014-07-19 NOTE — ED Notes (Signed)
Advised pt. At time of discharge that he was not to drive tonight due receiving strong pain medication.

## 2014-07-19 NOTE — Discharge Instructions (Signed)

## 2014-07-24 ENCOUNTER — Telehealth (HOSPITAL_COMMUNITY): Payer: Self-pay | Admitting: *Deleted

## 2014-07-24 ENCOUNTER — Other Ambulatory Visit (HOSPITAL_COMMUNITY): Payer: Self-pay | Admitting: Hematology and Oncology

## 2014-07-24 MED ORDER — OXYCODONE HCL 10 MG PO TABS: ORAL_TABLET | ORAL | Status: DC

## 2014-07-31 ENCOUNTER — Encounter (HOSPITAL_COMMUNITY): Payer: Self-pay | Admitting: Emergency Medicine

## 2014-07-31 ENCOUNTER — Encounter: Payer: Self-pay | Admitting: Gastroenterology

## 2014-07-31 ENCOUNTER — Emergency Department (HOSPITAL_COMMUNITY): Payer: Medicare Other

## 2014-07-31 ENCOUNTER — Inpatient Hospital Stay (HOSPITAL_COMMUNITY)
Admission: EM | Admit: 2014-07-31 | Discharge: 2014-08-02 | DRG: 442 | Disposition: A | Discharge status: Home / Self Care | Payer: Medicare Other | Attending: Internal Medicine | Admitting: Internal Medicine

## 2014-07-31 DIAGNOSIS — J449 Chronic obstructive pulmonary disease, unspecified: Secondary | ICD-10-CM | POA: Diagnosis present

## 2014-07-31 DIAGNOSIS — Z6841 Body Mass Index (BMI) 40.0 and over, adult: Secondary | ICD-10-CM | POA: Diagnosis not present

## 2014-07-31 DIAGNOSIS — K219 Gastro-esophageal reflux disease without esophagitis: Secondary | ICD-10-CM | POA: Diagnosis present

## 2014-07-31 DIAGNOSIS — Z9114 Patient's other noncompliance with medication regimen: Secondary | ICD-10-CM | POA: Diagnosis present

## 2014-07-31 DIAGNOSIS — R4182 Altered mental status, unspecified: Secondary | ICD-10-CM | POA: Diagnosis present

## 2014-07-31 DIAGNOSIS — D61818 Other pancytopenia: Secondary | ICD-10-CM | POA: Diagnosis present

## 2014-07-31 DIAGNOSIS — I10 Essential (primary) hypertension: Secondary | ICD-10-CM | POA: Diagnosis present

## 2014-07-31 DIAGNOSIS — D696 Thrombocytopenia, unspecified: Secondary | ICD-10-CM | POA: Diagnosis present

## 2014-07-31 DIAGNOSIS — G8929 Other chronic pain: Secondary | ICD-10-CM | POA: Diagnosis present

## 2014-07-31 DIAGNOSIS — K7460 Unspecified cirrhosis of liver: Secondary | ICD-10-CM | POA: Diagnosis present

## 2014-07-31 DIAGNOSIS — B182 Chronic viral hepatitis C: Secondary | ICD-10-CM | POA: Insufficient documentation

## 2014-07-31 DIAGNOSIS — E1140 Type 2 diabetes mellitus with diabetic neuropathy, unspecified: Secondary | ICD-10-CM | POA: Diagnosis present

## 2014-07-31 DIAGNOSIS — R609 Edema, unspecified: Secondary | ICD-10-CM

## 2014-07-31 DIAGNOSIS — E119 Type 2 diabetes mellitus without complications: Secondary | ICD-10-CM | POA: Diagnosis present

## 2014-07-31 DIAGNOSIS — Z794 Long term (current) use of insulin: Secondary | ICD-10-CM

## 2014-07-31 DIAGNOSIS — M79674 Pain in right toe(s): Secondary | ICD-10-CM

## 2014-07-31 DIAGNOSIS — F1490 Cocaine use, unspecified, uncomplicated: Secondary | ICD-10-CM | POA: Diagnosis present

## 2014-07-31 DIAGNOSIS — E669 Obesity, unspecified: Secondary | ICD-10-CM | POA: Diagnosis present

## 2014-07-31 DIAGNOSIS — B1920 Unspecified viral hepatitis C without hepatic coma: Secondary | ICD-10-CM | POA: Diagnosis present

## 2014-07-31 DIAGNOSIS — K7290 Hepatic failure, unspecified without coma: Principal | ICD-10-CM | POA: Diagnosis present

## 2014-07-31 DIAGNOSIS — R404 Transient alteration of awareness: Secondary | ICD-10-CM

## 2014-07-31 DIAGNOSIS — K7030 Alcoholic cirrhosis of liver without ascites: Secondary | ICD-10-CM | POA: Diagnosis present

## 2014-07-31 DIAGNOSIS — R1011 Right upper quadrant pain: Secondary | ICD-10-CM

## 2014-07-31 LAB — BASIC METABOLIC PANEL
Anion gap: 11 (ref 5–15)
BUN: 18 mg/dL (ref 6–23)
CO2: 23 meq/L (ref 19–32)
CREATININE: 0.74 mg/dL (ref 0.50–1.35)
Calcium: 8.7 mg/dL (ref 8.4–10.5)
Chloride: 108 mEq/L (ref 96–112)
GFR calc Af Amer: >90 mL/min (ref >90)
GLUCOSE: 176 mg/dL — AB (ref 70–99)
Potassium: 4.6 mEq/L (ref 3.7–5.3)
Sodium: 142 mEq/L (ref 137–147)

## 2014-07-31 LAB — RAPID URINE DRUG SCREEN, HOSP PERFORMED
Amphetamines: NOT DETECTED
BARBITURATES: NOT DETECTED
Benzodiazepines: NOT DETECTED
Cocaine: POSITIVE — AB
Opiates: NOT DETECTED
Tetrahydrocannabinol: NOT DETECTED

## 2014-07-31 LAB — CBC WITH DIFFERENTIAL/PLATELET
Basophils Absolute: 0 10*3/uL (ref 0.0–0.1)
Basophils Relative: 0 % (ref 0–1)
Eosinophils Absolute: 0 10*3/uL (ref 0.0–0.7)
Eosinophils Relative: 1 % (ref 0–5)
HCT: 32.9 % — ABNORMAL LOW (ref 39.0–52.0)
Hemoglobin: 10.7 g/dL — ABNORMAL LOW (ref 13.0–17.0)
LYMPHS ABS: 0.8 10*3/uL (ref 0.7–4.0)
LYMPHS PCT: 25 % (ref 12–46)
MCH: 31.7 pg (ref 26.0–34.0)
MCHC: 32.5 g/dL (ref 30.0–36.0)
MCV: 97.3 fL (ref 78.0–100.0)
MONO ABS: 0.4 10*3/uL (ref 0.1–1.0)
Monocytes Relative: 11 % (ref 3–12)
Neutro Abs: 2.1 10*3/uL (ref 1.7–7.7)
Neutrophils Relative %: 63 % (ref 43–77)
Platelets: 54 10*3/uL — ABNORMAL LOW (ref 150–400)
RBC: 3.38 MIL/uL — AB (ref 4.22–5.81)
RDW: 14.4 % (ref 11.5–15.5)
WBC: 3.3 10*3/uL — AB (ref 4.0–10.5)

## 2014-07-31 LAB — ETHANOL

## 2014-07-31 LAB — HEPATIC FUNCTION PANEL
ALK PHOS: 97 U/L (ref 39–117)
ALT: 22 U/L (ref 0–53)
AST: 41 U/L — AB (ref 0–37)
Albumin: 3 g/dL — ABNORMAL LOW (ref 3.5–5.2)
Bilirubin, Direct: 0.7 mg/dL — ABNORMAL HIGH (ref 0.0–0.3)
Indirect Bilirubin: 1.1 mg/dL — ABNORMAL HIGH (ref 0.3–0.9)
Total Bilirubin: 1.8 mg/dL — ABNORMAL HIGH (ref 0.3–1.2)
Total Protein: 7.1 g/dL (ref 6.0–8.3)

## 2014-07-31 LAB — URINALYSIS, ROUTINE W REFLEX MICROSCOPIC
BILIRUBIN URINE: NEGATIVE
GLUCOSE, UA: NEGATIVE mg/dL
Ketones, ur: NEGATIVE mg/dL
Leukocytes, UA: NEGATIVE
Nitrite: NEGATIVE
PROTEIN: 30 mg/dL — AB
Specific Gravity, Urine: 1.025 (ref 1.005–1.030)
Urobilinogen, UA: 1 mg/dL (ref 0.0–1.0)
pH: 6 (ref 5.0–8.0)

## 2014-07-31 LAB — GLUCOSE, CAPILLARY: Glucose-Capillary: 207 mg/dL — ABNORMAL HIGH (ref 70–99)

## 2014-07-31 LAB — URINE MICROSCOPIC-ADD ON

## 2014-07-31 LAB — TROPONIN I: Troponin I: <0.3 ng/mL (ref <0.30)

## 2014-07-31 LAB — LIPASE, BLOOD: LIPASE: 78 U/L — AB (ref 11–59)

## 2014-07-31 LAB — AMMONIA: Ammonia: 67 umol/L — ABNORMAL HIGH (ref 11–60)

## 2014-07-31 LAB — PRO B NATRIURETIC PEPTIDE: PRO B NATRI PEPTIDE: 582.6 pg/mL — AB (ref 0–125)

## 2014-07-31 LAB — CBG MONITORING, ED: Glucose-Capillary: 160 mg/dL — ABNORMAL HIGH (ref 70–99)

## 2014-07-31 MED ORDER — RIBAVIRIN 600 MG PO TABS
600.0000 mg | ORAL_TABLET | Freq: Two times a day (BID) | ORAL | Status: DC
  Administered 2014-07-31 – 2014-08-02 (×4): 600 mg via ORAL

## 2014-07-31 MED ORDER — SODIUM CHLORIDE 0.9 % IV SOLN
INTRAVENOUS | Status: DC
  Administered 2014-07-31: 21:00:00 via INTRAVENOUS

## 2014-07-31 MED ORDER — ACETAMINOPHEN 500 MG PO TABS
1000.0000 mg | ORAL_TABLET | Freq: Four times a day (QID) | ORAL | Status: DC | PRN
  Administered 2014-07-31 – 2014-08-02 (×2): 1000 mg via ORAL
  Filled 2014-07-31 (×2): qty 2

## 2014-07-31 MED ORDER — HYDROXYZINE HCL 10 MG PO TABS
10.0000 mg | ORAL_TABLET | ORAL | Status: DC | PRN
  Filled 2014-07-31: qty 1

## 2014-07-31 MED ORDER — ONDANSETRON HCL 4 MG PO TABS: 4.0000 mg | ORAL_TABLET | Freq: Four times a day (QID) | ORAL | Status: DC | PRN

## 2014-07-31 MED ORDER — SPIRONOLACTONE 25 MG PO TABS
100.0000 mg | ORAL_TABLET | Freq: Every day | ORAL | Status: DC
  Administered 2014-08-01 – 2014-08-02 (×2): 100 mg via ORAL
  Filled 2014-07-31 (×2): qty 4

## 2014-07-31 MED ORDER — SOFOSBUVIR 400 MG PO TABS
400.0000 mg | ORAL_TABLET | Freq: Every evening | ORAL | Status: DC
Start: 2014-07-31 — End: 2014-08-02
  Administered 2014-07-31 – 2014-08-01 (×2): 400 mg via ORAL

## 2014-07-31 MED ORDER — INSULIN ASPART 100 UNIT/ML ~~LOC~~ SOLN
0.0000 [IU] | Freq: Three times a day (TID) | SUBCUTANEOUS | Status: DC
  Administered 2014-08-01 (×2): 2 [IU] via SUBCUTANEOUS
  Administered 2014-08-01: 3 [IU] via SUBCUTANEOUS
  Administered 2014-08-02: 1 [IU] via SUBCUTANEOUS
  Administered 2014-08-02: 5 [IU] via SUBCUTANEOUS

## 2014-07-31 MED ORDER — FUROSEMIDE 20 MG PO TABS
20.0000 mg | ORAL_TABLET | Freq: Every day | ORAL | Status: DC
  Administered 2014-08-01 – 2014-08-02 (×2): 20 mg via ORAL
  Filled 2014-07-31 (×2): qty 1

## 2014-07-31 MED ORDER — INSULIN GLARGINE 100 UNIT/ML ~~LOC~~ SOLN
10.0000 [IU] | Freq: Every day | SUBCUTANEOUS | Status: DC
  Administered 2014-08-01 (×2): 10 [IU] via SUBCUTANEOUS
  Filled 2014-07-31 (×7): qty 0.1

## 2014-07-31 MED ORDER — PNEUMOCOCCAL VAC POLYVALENT 25 MCG/0.5ML IJ INJ
0.5000 mL | INJECTION | INTRAMUSCULAR | Status: AC
  Administered 2014-08-01: 0.5 mL via INTRAMUSCULAR
  Filled 2014-07-31: qty 0.5

## 2014-07-31 MED ORDER — ONDANSETRON HCL 4 MG/2ML IJ SOLN
4.0000 mg | Freq: Four times a day (QID) | INTRAMUSCULAR | Status: DC | PRN
Start: 2014-07-31 — End: 2014-08-02

## 2014-07-31 MED ORDER — DACLATASVIR DIHYDROCHLORIDE 60 MG PO TABS
60.0000 mg | ORAL_TABLET | Freq: Every evening | ORAL | Status: DC
  Administered 2014-07-31 – 2014-08-01 (×2): 60 mg via ORAL

## 2014-07-31 MED ORDER — INFLUENZA VAC SPLIT QUAD 0.5 ML IM SUSY
0.5000 mL | PREFILLED_SYRINGE | INTRAMUSCULAR | Status: AC
  Administered 2014-08-01: 0.5 mL via INTRAMUSCULAR
  Filled 2014-07-31: qty 0.5

## 2014-07-31 NOTE — ED Notes (Signed)
Old dressing to right foot.  Pt not sure what happened to foot.  Pt is requesting to call is wife by every staff member entering the room.  Have made several attempts to call wife.  Able to state name but not able to tell me why he is here in ED.

## 2014-07-31 NOTE — ED Notes (Signed)
Pt is now alert and asking questions, pt know his phone number and wants to call his wife. Pt does not remember how he got here and why he is here. Pt on phone with wife.

## 2014-07-31 NOTE — ED Notes (Signed)
Spoke with Ricky Stabs at [phone].  Informed that pt is confused and Dr would like to speak with her and to please bring all pts medications.

## 2014-07-31 NOTE — ED Notes (Signed)
Wife called states pt has been talking out of his head last few days.  started a new medication for Hep C.

## 2014-07-31 NOTE — ED Notes (Signed)
Patient alert and oriented at this time. Answers questions appropriately. Asking [place] not to share information of test results with his family.

## 2014-07-31 NOTE — ED Provider Notes (Signed)
CSN: [id number]     Arrival date & time 07/31/14  1201 History   First MD Initiated Contact with Patient 07/31/14 1302     Chief Complaint  Patient presents with  . Altered Mental Status     (Consider location/radiation/quality/duration/timing/severity/associated sxs/prior Treatment) HPI Comments: Patient presents to the ER for evaluation of confusion. Patient reportedly has been talking "out of his head" for the last few days. He is brought to the ER by ambulance. Wife told EMS that he has been combative and confused for at least 3 days. He has recently started a new medication for his hepatitis C. Patient agitated and confused upon arrival, cannot provide any further information. Level V Caveat due to confusion.  Patient is a 53 y.o. male presenting with altered mental status.  Altered Mental Status   Past Medical History  Diagnosis Date  . Hypertension   . Diabetes mellitus   . GERD (gastroesophageal reflux disease)     DEC 2010 EGD/Bx REACTIVE GASTROPATHY, NO VARICES  . Hemorrhoids, internal   . BMI 40.0-44.9, adult OCT 2010 269 LBS    APR 2012 279 LBS AUG 2014 185 LBS  . 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 IV drug abuse REMOTE  . Hepatitis 2010 HEP C    AST 509 ALT 267 ALK PHOS 165 ALB 3.8 NEG IGM HAV/HBSAg  . Gallstone AUG 2012 1 CM  . GERD 10/25/2009  . COPD (chronic obstructive pulmonary disease)   . Hepatitis C   . Pancytopenia 2013  . Splenomegaly 2013  . Other pancytopenia 02/18/2013  . Iron (Fe) deficiency anemia 02/18/2013  . Splenomegaly 02/18/2013  . Neuropathy   . Biliary dyskinesia JUL 2015    HIDA GB EF 5%  . Chronic pain    Past Surgical History  Procedure Laterality Date  . Sigmoidoscopy      2001 DR. FLEISCHMAN INTERNAL HERMORRHOIDS  . Upper gastrointestinal endoscopy  DEC 2010    BENIGN POLYPS, GASTRITIS, ?phg  . Knee surgery  RIGHT  . Hemorrhoid surgery    . Esophageal biopsy  09/08/2011    [name]:Moderate  gastritis/Polyps, multiple in the body of the stomach  . Colonoscopy with propofol N/A 11/15/2013    [name]: rectal varices, small AVMs  . Esophagogastroduodenoscopy (egd) with propofol N/A 11/15/2013    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 Next EGD in April 2016.     Family History  Problem Relation Age of Onset  . Colon cancer Neg Hx   . Anesthesia problems Neg Hx   . Hypotension Neg Hx   . Malignant hyperthermia Neg Hx   . Pseudochol deficiency Neg Hx   . Cancer Father    History  Substance Use Topics  . Smoking status: Never Smoker   . Smokeless tobacco: Never Used  . Alcohol Use: No     Comment: 30 years ago    Review of Systems  Unable to perform ROS: Mental status change  Endocrine: Positive for polydipsia.      Allergies  Review of patient's allergies indicates no known allergies.  Home Medications   Prior to Admission medications   Medication Sig Start Date End Date Taking? Authorizing Provider  acetaminophen (TYLENOL) 500 MG tablet Take 1,000 mg by mouth every 6 (six) hours as needed for mild pain or headache.     Historical Provider, MD  albuterol (PROVENTIL HFA;VENTOLIN HFA) 108 (90 BASE) MCG/ACT  inhaler Inhale 2 puffs into the lungs every 6 (six) hours as needed. Shortness of breath    Historical Provider, MD  cephALEXin (KEFLEX) 500 MG capsule Take 1 capsule (500 mg total) by mouth 4 (four) times daily. Patient not taking: Reported on 07/19/2014 06/30/14   Veryl Speak, MD  DAKLINZA 60 MG TABS Take 60 mg by mouth every evening.  06/07/14   Historical Provider, MD  esomeprazole (NEXIUM) 40 MG capsule Take 1 capsule (40 mg total) by mouth 2 (two) times daily before a meal. 11/17/13   Orvil Feil, NP  furosemide (LASIX) 20 MG tablet TAKE 1 TABLET EVERY DAY 07/07/14   [name], MD  glipiZIDE (GLUCOTROL) 10 MG tablet TAKE 1 TABLET EVERY DAY 07/07/14   [name], MD  hydrOXYzine  (ATARAX/VISTARIL) 10 MG tablet Take 10 mg by mouth every 4 (four) hours as needed for itching.  06/06/14   Historical Provider, MD  insulin glargine (LANTUS) 100 UNIT/ML injection Inject 10 Units into the skin at bedtime.    Historical Provider, MD  Oxycodone HCl 10 MG TABS Take 1 or 2 tablets every 4 hours to control pain. Patient not taking: Reported on 07/19/2014 07/17/14   Farrel Gobble, MD  Oxycodone HCl 10 MG TABS Take 1 or 2 tablets every 4 hours to control pain 07/24/14   Farrel Gobble, MD  ribavirin (REBETOL) 200 MG capsule Take 600 mg by mouth 2 (two) times daily.  07/03/14   Historical Provider, MD  SOVALDI 400 MG TABS Take 400 mg by mouth every evening.  06/07/14   Historical Provider, MD  spironolactone (ALDACTONE) 50 MG tablet Take 2 tablets (100 mg total) by mouth daily. Patient not taking: Reported on 06/30/2014 03/01/14   [name], MD   BP 173/79 mmHg  Pulse 91  Temp(Src) 97.4 F (36.3 C) (Oral)  Resp 20  Ht $R'5\' 6"'vN$  (1.676 m)  Wt 295 lb (133.811 kg)  BMI 47.64 kg/m2  SpO2 100% Physical Exam  Constitutional: He appears well-developed and well-nourished. He appears listless. No distress.  HENT:  Head: Normocephalic and atraumatic.  Right Ear: Hearing normal.  Left Ear: Hearing normal.  Nose: Nose normal.  Mouth/Throat: Oropharynx is clear and moist and mucous membranes are normal.  Eyes: Conjunctivae and EOM are normal. Pupils are equal, round, and reactive to light.  Neck: Normal range of motion. Neck supple.  Cardiovascular: Regular rhythm, S1 normal and S2 normal.  Exam reveals no gallop and no friction rub.   No murmur heard. Pulmonary/Chest: Effort normal and breath sounds normal. No respiratory distress. He exhibits no tenderness.  Abdominal: Soft. Normal appearance and bowel sounds are normal. There is no hepatosplenomegaly. There is no tenderness. There is no rebound, no guarding, no tenderness at McBurney's point and negative Murphy's sign. No hernia.   Musculoskeletal: Normal range of motion.  Neurological: He has normal strength. He appears listless. He is disoriented. No cranial nerve deficit or sensory deficit. Coordination normal. GCS eye subscore is 4. GCS verbal subscore is 4. GCS motor subscore is 6.  Skin: Skin is warm, dry and intact. No rash noted. No cyanosis.  Psychiatric: He has a normal mood and affect. His speech is normal and behavior is normal. Thought content normal.  Nursing note and vitals reviewed.   ED Course  Procedures (including critical care time) Labs Review Labs Reviewed  CBC WITH DIFFERENTIAL - Abnormal; Notable for the following:    WBC 3.3 (*)    RBC 3.38 (*)  Hemoglobin 10.7 (*)    HCT 32.9 (*)    Platelets 54 (*)    All other components within normal limits  BASIC METABOLIC PANEL - Abnormal; Notable for the following:    Glucose, Bld 176 (*)    All other components within normal limits  URINALYSIS, ROUTINE W REFLEX MICROSCOPIC - Abnormal; Notable for the following:    APPearance HAZY (*)    Hgb urine dipstick SMALL (*)    Protein, ur 30 (*)    All other components within normal limits  LIPASE, BLOOD - Abnormal; Notable for the following:    Lipase 78 (*)    All other components within normal limits  HEPATIC FUNCTION PANEL - Abnormal; Notable for the following:    Albumin 3.0 (*)    AST 41 (*)    Total Bilirubin 1.8 (*)    Bilirubin, Direct 0.7 (*)    Indirect Bilirubin 1.1 (*)    All other components within normal limits  AMMONIA - Abnormal; Notable for the following:    Ammonia 67 (*)    All other components within normal limits  PRO B NATRIURETIC PEPTIDE - Abnormal; Notable for the following:    Pro B Natriuretic peptide (BNP) 582.6 (*)    All other components within normal limits  URINE MICROSCOPIC-ADD ON - Abnormal; Notable for the following:    Squamous Epithelial / LPF FEW (*)    Bacteria, UA FEW (*)    All other components within normal limits  URINE RAPID DRUG SCREEN (HOSP  PERFORMED) - Abnormal; Notable for the following:    Cocaine POSITIVE (*)    All other components within normal limits  CBG MONITORING, ED - Abnormal; Notable for the following:    Glucose-Capillary 160 (*)    All other components within normal limits  TROPONIN I  ETHANOL    Imaging Review Dg Chest 2 View  07/31/2014   CLINICAL DATA:  Edema, disoriented  EXAM: CHEST  2 VIEW  COMPARISON:  01/29/2014  FINDINGS: The heart size and mediastinal contours are stable. Both lungs are clear. The visualized skeletal structures are unremarkable.  IMPRESSION: No active cardiopulmonary disease.   Electronically Signed   By: Kathreen Devoid   On: 07/31/2014 14:22   Ct Head Wo Contrast  07/31/2014   CLINICAL DATA:  Confusion and altered mental status. Fall in bathroom this morning. Edema.  EXAM: CT HEAD WITHOUT CONTRAST  TECHNIQUE: Contiguous axial images were obtained from the base of the skull through the vertex without intravenous contrast.  COMPARISON:  None.  FINDINGS: No intracranial hemorrhage, mass effect, or midline shift. No hydrocephalus. The basilar cisterns are patent. No evidence of territorial infarct. No intracranial fluid collection. Calvarium is intact. Trace mucosal thickening of right maxillary sinus, mastoid air cells are well aerated.  IMPRESSION: No acute intracranial process.   Electronically Signed   By: Jeb Levering M.D.   On: 07/31/2014 14:23     EKG Interpretation   Date/Time:  Monday July 31 2014 14:12:24 EST Ventricular Rate:  102 PR Interval:  149 QRS Duration: 99 QT Interval:  393 QTC Calculation: 512 R Axis:   77 Text Interpretation:  Sinus tachycardia Prolonged QT interval No  significant change since last tracing Confirmed by Dewayne Severe  MD,  Gonzales [phone]) on 07/31/2014 2:18:38 PM      MDM   Final diagnoses:  Edema  Transient alteration of awareness    Patient presents to the ER for evaluation of mental status changes. Patient was  sent to the ER  by ambulance after his wife called EMS because of confusion. Patient apparently became extremely agitated this morning. Upon arrival to the ER, patient is completely confused. He cannot answer any questions appropriately. When I entered the room to evaluate him he was standing in the corner urinating on the wall. He kept trying to climb get out of the bed. He was difficult to redirect.  Patient has slowly improved here in the ER without any significant interventions. I just reevaluated him and he is awake, alert and oriented. Cause of his significant confusion earlier is unclear. Urinalysis did not show obvious infection. Ammonia is only mildly elevated at 67. CBC shows pancytopenia, but at his normal baseline. Renal function is normal, electrolytes are normal. LFTs are only very slightly elevated, and at his normal baseline. Cardiac workup negative.  Patient did have a CT head performed in the ER showed no acute process. PAtient will require observation for his acute mental status changes. No focal findings were present at arrival, but TIA is not ruled out.    Orpah Greek, MD 07/31/14 [id number]

## 2014-07-31 NOTE — ED Notes (Signed)
Patient alert and oriented. Answers questions appropriately. Asking for something to eat and drink.

## 2014-07-31 NOTE — ED Notes (Signed)
Pt voided in the floor and on the wall, house keeping called

## 2014-07-31 NOTE — ED Notes (Signed)
Pt yelling up out of bed trying to sit in the floor, got pt in a chair. Pt still confused answers every question with his name

## 2014-07-31 NOTE — H&P (Signed)
PCP:   Redge Gainer, MD   Chief Complaint:  Altered mental status  HPI: 53 year old Castillo who   has a past medical history of Hypertension; Diabetes mellitus; GERD (gastroesophageal reflux disease); Hemorrhoids, internal; BMI 40.0-44.9, adult (OCT 2010 269 LBS); Cirrhosis (NOV 2010 CHILD PUGH A); IV drug abuse (REMOTE); Hepatitis (2010 HEP C); Gallstone (AUG 2012 1 CM); GERD (10/25/2009); COPD (chronic obstructive pulmonary disease); Hepatitis C; Pancytopenia (2013); Splenomegaly (2013); Other pancytopenia (02/18/2013); Iron (Fe) deficiency anemia (02/18/2013); Splenomegaly (02/18/2013); Neuropathy; Biliary dyskinesia (JUL 2015); and Chronic pain. Today he was brought to the ED for evaluation of confusion. Patient was confused when he arrived here wife told EMS that he has been combative and confused for past 3 days. When he arrived he was confused and agitated, but when I saw the patient he had improved and was back to his baseline. Patient is alert and oriented 3. He has a history of hepatitis C and has been followed by gastroenterology as outpatient. He says his medications for hepatitis C were changed almost 3 months ago. He also admits to passing out, denies any chest pain or shortness of breath no nausea vomiting. He had one loose poor movement this morning but denies any blood in the stool. Patient had urine drug screen which was positive for cocaine, he admits to doing cocaine 4 days ago.  Allergies:  No Known Allergies    Past Medical History  Diagnosis Date  . Hypertension   . Diabetes mellitus   . GERD (gastroesophageal reflux disease)     DEC 2010 EGD/Bx REACTIVE GASTROPATHY, NO VARICES  . Hemorrhoids, internal   . BMI 40.0-44.9, adult OCT 2010 269 LBS    APR 2012 279 LBS AUG 2014 185 LBS  . 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 IV drug abuse REMOTE  . Hepatitis 2010 HEP C    AST 509 ALT 267 ALK PHOS 165 ALB 3.8 NEG IGM HAV/HBSAg  . Gallstone AUG 2012 1 CM    . GERD 10/25/2009  . COPD (chronic obstructive pulmonary disease)   . Hepatitis C   . Pancytopenia 2013  . Splenomegaly 2013  . Other pancytopenia 02/18/2013  . Iron (Fe) deficiency anemia 02/18/2013  . Splenomegaly 02/18/2013  . Neuropathy   . Biliary dyskinesia JUL 2015    HIDA GB EF 5%  . Chronic pain     Past Surgical History  Procedure Laterality Date  . Sigmoidoscopy      2001 DR. FLEISCHMAN INTERNAL HERMORRHOIDS  . Upper gastrointestinal endoscopy  DEC 2010    BENIGN POLYPS, GASTRITIS, ?phg  . Knee surgery  RIGHT  . Hemorrhoid surgery    . Esophageal biopsy  09/08/2011    [name]:Moderate gastritis/Polyps, multiple in the body of the stomach  . Colonoscopy with propofol N/A 11/15/2013    [name]: rectal varices, small AVMs  . Esophagogastroduodenoscopy (egd) with propofol N/A 11/15/2013    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 Next EGD in April 2016.      Prior to Admission medications   Medication Sig Start Date End Date Taking? Authorizing Provider  acetaminophen (TYLENOL) 500 MG tablet Take 1,000 mg by mouth every 6 (six) hours as needed for mild pain or headache.     Historical Provider, MD  albuterol (PROVENTIL HFA;VENTOLIN HFA) 108 (90 BASE) MCG/ACT inhaler Inhale 2 puffs into the lungs every 6 (six) hours as needed. Shortness of  breath    Historical Provider, MD  cephALEXin (KEFLEX) 500 MG capsule Take 1 capsule (500 mg total) by mouth 4 (four) times daily. Patient not taking: Reported on 07/19/2014 06/30/14   Veryl Speak, MD  DAKLINZA 60 MG TABS Take 60 mg by mouth every evening.  06/07/14   Historical Provider, MD  esomeprazole (NEXIUM) 40 MG capsule Take 1 capsule (40 mg total) by mouth 2 (two) times daily before a meal. 11/17/13   Orvil Feil, NP  furosemide (LASIX) 20 MG tablet TAKE 1 TABLET EVERY DAY 07/07/14   [name], MD  glipiZIDE (GLUCOTROL) 10 MG tablet TAKE 1 TABLET EVERY DAY  07/07/14   [name], MD  hydrOXYzine (ATARAX/VISTARIL) 10 MG tablet Take 10 mg by mouth every 4 (four) hours as needed for itching.  06/06/14   Historical Provider, MD  insulin glargine (LANTUS) 100 UNIT/ML injection Inject 10 Units into the skin at bedtime.    Historical Provider, MD  Oxycodone HCl 10 MG TABS Take 1 or 2 tablets every 4 hours to control pain. Patient not taking: Reported on 07/19/2014 07/17/14   Farrel Gobble, MD  Oxycodone HCl 10 MG TABS Take 1 or 2 tablets every 4 hours to control pain 07/24/14   Farrel Gobble, MD  ribavirin (REBETOL) 200 MG capsule Take 600 mg by mouth 2 (two) times daily.  07/03/14   Historical Provider, MD  SOVALDI 400 MG TABS Take 400 mg by mouth every evening.  06/07/14   Historical Provider, MD  spironolactone (ALDACTONE) 50 MG tablet Take 2 tablets (100 mg total) by mouth daily. Patient not taking: Reported on 06/30/2014 03/01/14   [name], MD    Social History:  reports that he has never smoked. He has never used smokeless tobacco. He reports that he uses illicit drugs (Marijuana and Cocaine). He reports that he does not drink alcohol.  Family History  Problem Relation Age of Onset  . Colon cancer Neg Hx   . Anesthesia problems Neg Hx   . Hypotension Neg Hx   . Malignant hyperthermia Neg Hx   . Pseudochol deficiency Neg Hx   . Cancer Father      All the positives are listed in BOLD  Review of Systems:   Chest : Shortness of breath, history of COPD, Asthma Heart : Chest pain, history of coronary arterey disease GI:  Nausea, vomiting, diarrhea, constipation, GERD, hepatitis C GU: Dysuria, urgency, frequency of urination, hematuria Neuro: Stroke, seizures, syncope Psych: Depression, anxiety, hallucinations   Physical Exam: Blood pressure 172/68, pulse 90, temperature 97.4 F (36.3 C), temperature source Oral, resp. rate 20, height $RemoveBe'5\' 6"'DLbcahvhv$  (1.676 m), weight 133.811 kg (295 lb), SpO2 100 %. Constitutional:   Patient is  a well-developed and well-nourished *Castillo in no acute distress and cooperative with exam. Head: Normocephalic and atraumatic Mouth: Mucus membranes moist Eyes: PERRL, EOMI, conjunctivae normal Neck: Supple, No Thyromegaly Cardiovascular: RRR, S1 normal, S2 normal Pulmonary/Chest: CTAB, no wheezes, rales, or rhonchi Abdominal: Soft. Non-tender, non-distended, bowel sounds are normal, no masses, organomegaly, or guarding present.  Neurological: A&O x3, Strength is normal and symmetric bilaterally, cranial nerve II-XII are grossly intact, no focal motor deficit, sensory intact to light touch bilaterally.  Extremities : No Cyanosis, Clubbing or Edema Foot- patient has open area on the little toe of right foot.  Labs on Admission:  Basic Metabolic Panel:  Recent Labs Lab 07/31/14 1220  NA 142  K 4.6  CL 108  CO2 23  GLUCOSE 176*  BUN 18  CREATININE 0.74  CALCIUM 8.7   Liver Function Tests:  Recent Labs Lab 07/31/14 1220  AST 41*  ALT 22  ALKPHOS 97  BILITOT 1.8*  PROT 7.1  ALBUMIN 3.0*    Recent Labs Lab 07/31/14 1220  LIPASE 78*    Recent Labs Lab 07/31/14 1403  AMMONIA 67*   CBC:  Recent Labs Lab 07/31/14 1220  WBC 3.3*  NEUTROABS 2.1  HGB 10.7*  HCT 32.9*  MCV 97.3  PLT 54*   Cardiac Enzymes:  Recent Labs Lab 07/31/14 1220  TROPONINI <0.30    BNP (last 3 results)  Recent Labs  10/24/13 2149 01/16/14 1450 07/31/14 1220  PROBNP 374.8* 153.4* 582.6*   CBG:  Recent Labs Lab 07/31/14 1233  GLUCAP 160*    Radiological Exams on Admission: Dg Chest 2 View  07/31/2014   CLINICAL DATA:  Edema, disoriented  EXAM: CHEST  2 VIEW  COMPARISON:  01/29/2014  FINDINGS: The heart size and mediastinal contours are stable. Both lungs are clear. The visualized skeletal structures are unremarkable.  IMPRESSION: No active cardiopulmonary disease.   Electronically Signed   By: Kathreen Devoid   On: 07/31/2014 14:22   Ct Head Wo Contrast  07/31/2014    CLINICAL DATA:  Confusion and altered mental status. Fall in bathroom this morning. Edema.  EXAM: CT HEAD WITHOUT CONTRAST  TECHNIQUE: Contiguous axial images were obtained from the base of the skull through the vertex without intravenous contrast.  COMPARISON:  None.  FINDINGS: No intracranial hemorrhage, mass effect, or midline shift. No hydrocephalus. The basilar cisterns are patent. No evidence of territorial infarct. No intracranial fluid collection. Calvarium is intact. Trace mucosal thickening of right maxillary sinus, mastoid air cells are well aerated.  IMPRESSION: No acute intracranial process.   Electronically Signed   By: Jeb Levering M.D.   On: 07/31/2014 14:23    EKG: Independently reviewed. Sinus tachycardia   Assessment/Plan Active Problems:   Mental status change   Altered mental status   diabetes mellitus Thrombocytopenia  Altered mental status Likely due to the cocaine use 4 days ago, versus side effect of hepatitis C medications. Patient is now back to baseline. Will monitor the patient on telemetry, obtain orthostatics every shift 3. Will also check MRI brain in the morning. CT head showed no acute process. Ammonia level 67.  Hepatitis C Continue Rebetol, Sovaldi, Daklinza. Liver enzymes AST 41, ALT 22, total bili 1.8.  Diabetes mellitus Continue Lantus 10 units at bedtime, start sliding scale insulin.  Thrombocytopenia Likely medication induced and due to hepatitis C Follow CBC in a.m.  Cocaine abuse Patient says that he usually does not do cocaine, did 4 days ago. Does not want to tell his family members.  DVT prophylaxis SCDs   Code status: Full code  Family discussion: Admission, patients condition and plan of care including tests being ordered have been discussed with the patient and *his wife at bedside who indicate understanding and agree with the plan and Code Status.   Time Spent on Admission: 60 min  [facility] Pager:  [phone] 07/31/2014, 8:34 PM  If 7PM-7AM, please contact night-coverage  [email or web address]  Password [email or web address]

## 2014-07-31 NOTE — ED Notes (Signed)
Per wife she had dental surgery and will not be able to come today. States she did not give pt his medication this morning.

## 2014-07-31 NOTE — ED Notes (Addendum)
Pt arrives EMS, states from pt naked in bathroom. Confused, pt oriented to person only. Wife states this has happened before when pt BS is elevated and pt gets combative. BS 200 per EMS. Pt has blood on right foot, per wife patient fell going to bathroom. Had already wet himself.

## 2014-08-01 ENCOUNTER — Observation Stay (HOSPITAL_COMMUNITY): Payer: Medicare Other

## 2014-08-01 ENCOUNTER — Inpatient Hospital Stay (HOSPITAL_COMMUNITY): Payer: Medicare Other

## 2014-08-01 DIAGNOSIS — D61818 Other pancytopenia: Secondary | ICD-10-CM

## 2014-08-01 DIAGNOSIS — K7290 Hepatic failure, unspecified without coma: Principal | ICD-10-CM

## 2014-08-01 DIAGNOSIS — K7460 Unspecified cirrhosis of liver: Secondary | ICD-10-CM

## 2014-08-01 LAB — COMPREHENSIVE METABOLIC PANEL
ALT: 21 U/L (ref 0–53)
ANION GAP: 3 — AB (ref 5–15)
AST: 41 U/L — ABNORMAL HIGH (ref 0–37)
Albumin: 2.8 g/dL — ABNORMAL LOW (ref 3.5–5.2)
Alkaline Phosphatase: 76 U/L (ref 39–117)
BILIRUBIN TOTAL: 1.9 mg/dL — AB (ref 0.3–1.2)
BUN: 18 mg/dL (ref 6–23)
CO2: 23 mmol/L (ref 19–32)
Calcium: 8.1 mg/dL — ABNORMAL LOW (ref 8.4–10.5)
Chloride: 114 mEq/L — ABNORMAL HIGH (ref 96–112)
Creatinine, Ser: 0.67 mg/dL (ref 0.50–1.35)
GFR calc Af Amer: >90 mL/min (ref >90)
GLUCOSE: 132 mg/dL — AB (ref 70–99)
Potassium: 3.6 mmol/L (ref 3.5–5.1)
Sodium: 140 mmol/L (ref 135–145)
Total Protein: 6 g/dL (ref 6.0–8.3)

## 2014-08-01 LAB — CBC
HEMATOCRIT: 30.2 % — AB (ref 39.0–52.0)
HEMOGLOBIN: 9.9 g/dL — AB (ref 13.0–17.0)
MCH: 31.7 pg (ref 26.0–34.0)
MCHC: 32.8 g/dL (ref 30.0–36.0)
MCV: 96.8 fL (ref 78.0–100.0)
Platelets: 54 10*3/uL — ABNORMAL LOW (ref 150–400)
RBC: 3.12 MIL/uL — ABNORMAL LOW (ref 4.22–5.81)
RDW: 14.6 % (ref 11.5–15.5)
WBC: 3.5 10*3/uL — ABNORMAL LOW (ref 4.0–10.5)

## 2014-08-01 LAB — GLUCOSE, CAPILLARY
Glucose-Capillary: 157 mg/dL — ABNORMAL HIGH (ref 70–99)
Glucose-Capillary: 233 mg/dL — ABNORMAL HIGH (ref 70–99)
Glucose-Capillary: 170 mg/dL — ABNORMAL HIGH (ref 70–99)
Glucose-Capillary: 139 mg/dL — ABNORMAL HIGH (ref 70–99)

## 2014-08-01 MED ORDER — RIFAXIMIN 550 MG PO TABS
550.0000 mg | ORAL_TABLET | Freq: Two times a day (BID) | ORAL | Status: DC
  Administered 2014-08-01 – 2014-08-02 (×3): 550 mg via ORAL
  Filled 2014-08-01 (×3): qty 1

## 2014-08-01 MED ORDER — LACTULOSE 10 GM/15ML PO SOLN
5.0000 g | Freq: Every day | ORAL | Status: DC
  Administered 2014-08-02: 5 g via ORAL
  Filled 2014-08-01: qty 30

## 2014-08-01 MED ORDER — LACTULOSE 10 GM/15ML PO SOLN: 10.0000 g | Freq: Two times a day (BID) | ORAL | Status: DC | PRN

## 2014-08-01 MED ORDER — PANTOPRAZOLE SODIUM 40 MG PO TBEC
40.0000 mg | DELAYED_RELEASE_TABLET | Freq: Every day | ORAL | Status: DC
  Administered 2014-08-01: 40 mg via ORAL
  Filled 2014-08-01: qty 1

## 2014-08-01 MED ORDER — PANTOPRAZOLE SODIUM 40 MG PO TBEC
40.0000 mg | DELAYED_RELEASE_TABLET | Freq: Every day | ORAL | Status: DC
  Administered 2014-08-02: 40 mg via ORAL
  Filled 2014-08-01: qty 1

## 2014-08-01 MED ORDER — LACTULOSE 10 GM/15ML PO SOLN
10.0000 g | Freq: Two times a day (BID) | ORAL | Status: DC
  Administered 2014-08-01: 10 g via ORAL
  Filled 2014-08-01: qty 30

## 2014-08-01 NOTE — Consult Note (Signed)
Referring Provider: Dr. Clementeen Graham Primary Care Physician:  Redge Gainer, MD Primary Gastroenterologist:  [name]   Date of Admission: 07/31/14 Date of Consultation: 08/01/14  Reason for Consultation: encephalopathy  HPI:  [name] is a 53 y.o. year old male with a history of HCV/ETOH cirrhosis, admitted with acute hepatic encephalopathy in the setting of recent cocaine use intranasally and non-compliance with lactulose dosing. Prescribed Lactulose prn in the past, but he ran out several months ago.    States he doesn't remember anything but believes his wife found him passed out on the floor. Didn't get lactulose refilled. Been out for several months. States usually loose stool in morning and then later in the evening. Solid BM this morning. Treatment for Hepatitis C through [facility] location in [place] (Roosevelt Locks, NP).   Last dose of Hep C medication yesterday evening. States he takes "3 pills in the morning". Verified with Roosevelt Locks, NP, that he is taking Ribavirin 200 mg, 3 capsules BID, Daklinza 60 mg each evening, and  Sovaldi 400 mg each evening. Cocaine use several days ago. States will never happen again. WIFE DOES NOT KNOW AND PATIENT DOES NOT WANT HER TO KNOW. NO ETOH use.    Past Medical History  Diagnosis Date  . Hypertension   . Diabetes mellitus   . GERD (gastroesophageal reflux disease)     DEC 2010 EGD/Bx REACTIVE GASTROPATHY, NO VARICES  . Hemorrhoids, internal   . BMI 40.0-44.9, adult OCT 2010 269 LBS    APR 2012 279 LBS AUG 2014 185 LBS  . 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 IV drug abuse REMOTE  . Hepatitis 2010 HEP C    AST 509 ALT 267 ALK PHOS 165 ALB 3.8 NEG IGM HAV/HBSAg  . Gallstone AUG 2012 1 CM  . GERD 10/25/2009  . COPD (chronic obstructive pulmonary disease)   . Hepatitis C   . Pancytopenia 2013  . Splenomegaly 2013  . Other pancytopenia 02/18/2013  . Iron (Fe) deficiency anemia  02/18/2013  . Splenomegaly 02/18/2013  . Neuropathy   . Biliary dyskinesia JUL 2015    HIDA GB EF 5%  . Chronic pain     Past Surgical History  Procedure Laterality Date  . Sigmoidoscopy      2001 DR. FLEISCHMAN INTERNAL HERMORRHOIDS  . Upper gastrointestinal endoscopy  DEC 2010    BENIGN POLYPS, GASTRITIS, ?phg  . Knee surgery  RIGHT  . Hemorrhoid surgery    . Esophageal biopsy  09/08/2011    [name]:Moderate gastritis/Polyps, multiple in the body of the stomach  . Colonoscopy with propofol N/A 11/15/2013    [name]: rectal varices, small AVMs  . Esophagogastroduodenoscopy (egd) with propofol N/A 11/15/2013    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 Next EGD in April 2016.      Prior to Admission medications   Medication Sig Start Date End Date Taking? Authorizing Provider  acetaminophen (TYLENOL) 500 MG tablet Take 1,000 mg by mouth every 6 (six) hours as needed for mild pain or headache.     Historical Provider, MD  albuterol (PROVENTIL HFA;VENTOLIN HFA) 108 (90 BASE) MCG/ACT inhaler Inhale 2 puffs into the lungs every 6 (six) hours as needed. Shortness of breath    Historical Provider, MD  cephALEXin (KEFLEX) 500 MG capsule Take 1 capsule (500 mg total) by mouth 4 (four) times daily. Patient not taking: Reported  on 07/19/2014 06/30/14   Veryl Speak, MD  DAKLINZA 60 MG TABS Take 60 mg by mouth every evening.  06/07/14   Historical Provider, MD  esomeprazole (NEXIUM) 40 MG capsule Take 1 capsule (40 mg total) by mouth 2 (two) times daily before a meal. 11/17/13   Orvil Feil, NP  furosemide (LASIX) 20 MG tablet TAKE 1 TABLET EVERY DAY 07/07/14   [name], MD  glipiZIDE (GLUCOTROL) 10 MG tablet TAKE 1 TABLET EVERY DAY 07/07/14   [name], MD  hydrOXYzine (ATARAX/VISTARIL) 10 MG tablet Take 10 mg by mouth every 4 (four) hours as needed for itching.  06/06/14   Historical Provider, MD  insulin glargine  (LANTUS) 100 UNIT/ML injection Inject 10 Units into the skin at bedtime.    Historical Provider, MD  Oxycodone HCl 10 MG TABS Take 1 or 2 tablets every 4 hours to control pain. Patient not taking: Reported on 07/19/2014 07/17/14   Farrel Gobble, MD  Oxycodone HCl 10 MG TABS Take 1 or 2 tablets every 4 hours to control pain 07/24/14   Farrel Gobble, MD  ribavirin (REBETOL) 200 MG capsule Take 600 mg by mouth 2 (two) times daily.  07/03/14   Historical Provider, MD  SOVALDI 400 MG TABS Take 400 mg by mouth every evening.  06/07/14   Historical Provider, MD  spironolactone (ALDACTONE) 50 MG tablet Take 2 tablets (100 mg total) by mouth daily. Patient not taking: Reported on 06/30/2014 03/01/14   [name], MD    Current Facility-Administered Medications  Medication Dose Route Frequency Provider Last Rate Last Dose  . 0.9 %  sodium chloride infusion   Intravenous Continuous Oswald Hillock, MD 10 mL/hr at 07/31/14 2100    . acetaminophen (TYLENOL) tablet 1,000 mg  1,000 mg Oral Q6H PRN Oswald Hillock, MD   1,000 mg at 07/31/14 2357  . Daclatasvir Dihydrochloride TABS 60 mg  60 mg Oral QPM Oswald Hillock, MD   60 mg at 07/31/14 2200  . furosemide (LASIX) tablet 20 mg  20 mg Oral Daily Oswald Hillock, MD      . hydrOXYzine (ATARAX/VISTARIL) tablet 10 mg  10 mg Oral Q4H PRN Oswald Hillock, MD      . Influenza vac split quadrivalent PF (FLUARIX) injection 0.5 mL  0.5 mL Intramuscular Tomorrow-1000 Oswald Hillock, MD      . insulin aspart (novoLOG) injection 0-9 Units  0-9 Units Subcutaneous TID WC Oswald Hillock, MD      . insulin glargine (LANTUS) injection 10 Units  10 Units Subcutaneous QHS Oswald Hillock, MD   10 Units at 08/01/14 0006  . lactulose (CHRONULAC) 10 GM/15ML solution 10 g  10 g Oral BID Nishant Dhungel, MD      . ondansetron (ZOFRAN) tablet 4 mg  4 mg Oral Q6H PRN Oswald Hillock, MD       Or  . ondansetron (ZOFRAN) injection 4 mg  4 mg Intravenous Q6H PRN Oswald Hillock, MD      . pneumococcal  23 valent vaccine (PNU-IMMUNE) injection 0.5 mL  0.5 mL Intramuscular Tomorrow-1000 Oswald Hillock, MD      . ribavirin (RIBATAB) tablet 600 mg  600 mg Oral BID Oswald Hillock, MD   600 mg at 07/31/14 2200  . Sofosbuvir TABS 400 mg  400 mg Oral QPM Oswald Hillock, MD   400 mg at 07/31/14 2200  . spironolactone (ALDACTONE) tablet 100 mg  100 mg Oral Daily Oswald Hillock, MD        Allergies as of 07/31/2014 - Review Complete 07/31/2014  Allergen Reaction Noted  . Penicillins Hives 07/31/2014    Family History  Problem Relation Age of Onset  . Colon cancer Neg Hx   . Anesthesia problems Neg Hx   . Hypotension Neg Hx   . Malignant hyperthermia Neg Hx   . Pseudochol deficiency Neg Hx   . Cancer Father     History   Social History  . Marital Status: Married    Spouse Name: N/A    Number of Children: N/A  . Years of Education: N/A   Occupational History  . Not on file.   Social History Main Topics  . Smoking status: Never Smoker   . Smokeless tobacco: Never Used  . Alcohol Use: No     Comment: 30 years ago  . Drug Use: Yes    Special: Marijuana, Cocaine     Comment: 30 yrs ago.  Marland Kitchen Sexual Activity:    Partners: Female    Museum/gallery curator: Condom     Comment: spouse   Other Topics Concern  . Not on file   Social History Narrative    Review of Systems: As mentioned in HPI  Physical Exam: Vital signs in last 24 hours: Temp:  [97.4 F (36.3 C)-97.8 F (36.6 C)] 97.8 F (36.6 C) (12/22 0545) Pulse Rate:  [72-104] 85 (12/22 0545) Resp:  [18-20] 20 (12/22 0545) BP: (155-179)/(57-80) 162/80 mmHg (12/22 0545) SpO2:  [98 %-100 %] 100 % (12/21 2056) Weight:  [294 lb (133.358 kg)-295 lb (133.811 kg)] 294 lb (133.358 kg) (12/22 0712) Last BM Date: 07/31/14 General:   Alert,  Well-developed, well-nourished, pleasant and cooperative in NAD Head:  Normocephalic and atraumatic. Eyes:  Sclera clear, no icterus.   Conjunctiva pink. Ears:  Normal auditory acuity. Nose:  No  deformity, discharge,  or lesions. Mouth:  No deformity or lesions Lungs:  Clear throughout to auscultation.   No wheezes, crackles, or rhonchi. No acute distress. Heart:  S1 S2 present without murmurs Abdomen:  Soft, obese, liver margin palpable several fingerbreadths below right costal margin.  Rectal:  Deferred  Msk:  Symmetrical without gross deformities. Normal posture. Pulses:  Normal pulses noted. Extremities:  Without  edema. Neurologic:  Alert and  oriented to person, place, situation. Confused regarding year, president. Easily reoriented. Negative asterixis.  Skin:  Intact without significant lesions or rashes. Psych:  Alert and cooperative. Normal mood and affect.  Intake/Output from previous day: 12/21 0701 - 12/22 0700 In: 60 [I.V.:60] Out: 320 [Urine:320] Intake/Output this shift:    Lab Results:  Recent Labs  07/30/14 0550 07/31/14 1220  WBC 3.5* 3.3*  HGB 9.9* 10.7*  HCT 30.2* 32.9*  PLT 54* 54*   BMET  Recent Labs  07/30/14 0550 07/31/14 1220  NA 140 142  K 3.6 4.6  CL 114* 108  CO2 23 23  GLUCOSE 132* 176*  BUN 18 18  CREATININE 0.67 0.74  CALCIUM 8.1* 8.7   LFT  Recent Labs  07/30/14 0550 07/31/14 1220  PROT 6.0 7.1  ALBUMIN 2.8* 3.0*  AST 41* 41*  ALT 21 22  ALKPHOS 76 97  BILITOT 1.9* 1.8*  BILIDIR  --  0.7*  IBILI  --  1.1*    Studies/Results: Dg Chest 2 View  07/31/2014   CLINICAL DATA:  Edema, disoriented  EXAM: CHEST  2 VIEW  COMPARISON:  01/29/2014  FINDINGS: The heart size and mediastinal contours are stable. Both lungs are clear. The visualized skeletal structures are unremarkable.  IMPRESSION: No active cardiopulmonary disease.   Electronically Signed   By: Kathreen Devoid   On: 07/31/2014 14:22   Ct Head Wo Contrast  07/31/2014   CLINICAL DATA:  Confusion and altered mental status. Fall in bathroom this morning. Edema.  EXAM: CT HEAD WITHOUT CONTRAST  TECHNIQUE: Contiguous axial images were obtained from the base of the  skull through the vertex without intravenous contrast.  COMPARISON:  None.  FINDINGS: No intracranial hemorrhage, mass effect, or midline shift. No hydrocephalus. The basilar cisterns are patent. No evidence of territorial infarct. No intracranial fluid collection. Calvarium is intact. Trace mucosal thickening of right maxillary sinus, mastoid air cells are well aerated.  IMPRESSION: No acute intracranial process.   Electronically Signed   By: Jeb Levering M.D.   On: 07/31/2014 14:23   Mr Brain Wo Contrast  08/01/2014   CLINICAL DATA:  53 year old diabetic hypertensive male with history of cirrhosis, hepatitis and IV drug abuse presenting with confusion and syncopal episode with recent fall. Subsequent encounter.  EXAM: MRI HEAD WITHOUT CONTRAST  TECHNIQUE: Multiplanar, multiecho pulse sequences of the brain and surrounding structures were obtained without intravenous contrast.  COMPARISON:  07/31/2014 CT.  FINDINGS: Exam is slightly limited secondary to patient's habitus.  No acute infarct.  No intracranial hemorrhage.  T1 slight increased signal within the inferior aspect of the globus pallidus/ cerebral peduncles. This has been described in patients with liver disease (patient with elevated ammonia level).  Scattered punctate and patchy white matter type changes probably related to result of small vessel disease.  Major intracranial vascular structures are patent.  Decreased signal intensity of bone marrow may be related to anemia.  Minimal exophthalmos.  Cervical medullary junction, pituitary region and pineal region unremarkable.  Minimal paranasal sinus mucosal thickening greater on the right.  IMPRESSION: Findings consistent with hepatocellular disease as noted above.  Mild small vessel disease type changes suspected.  No acute infarct.  Bone marrow changes may reflect result of patient's underlying anemia.   Electronically Signed   By: Chauncey Cruel M.D.   On: 08/01/2014 08:41     Impression: 53 year old male with HCV/ETOH cirrhosis, admitted with hepatic encephalopathy in the setting of recent cocaine use and non-compliance with lactulose dosing. WIFE IS NOT AWARE OF ILLICIT DRUG USE AND PATIENT WOULD LIKE TO KEEP THIS CONFIDENTIAL. Clinically improved since admission; he has very mild confusion regarding the year and president, but he is easily reoriented.   Discussed with Roosevelt Locks, NP treating Hep C; informed of recent illicit drug use and verified dosing of treatments. Continue Ribavirin 600 mg BID, Daklinza and Sovaldi each evening. Monitor to keep Hgb at 10 or greater while on Ribavirin.   Lactulose BID, titrating to 3 soft BMs daily. Will add Xifaxan, although insurance may not cover this as an outpatient. We will attempt prior authorization as an outpatient if necessary.   Plan: Continue Hep C treatments as per outlined above PPI daily Lactulose titrated to achieve 3 soft BMS daily Xifaxan 550 mg po BID AVOIDANCE OF ILLICIT DRUGS Monitor Hgb Next EGD for variceal surveillance April 2016  Orvil Feil, ANP-BC [facility]     LOS: 1 day    08/01/2014, 9:21 AM

## 2014-08-01 NOTE — Progress Notes (Addendum)
[facility] PROGRESS NOTE  [name] GQQ:[id number] DOB: 1961/07/22 DOA: 07/31/2014 PCP: Redge Gainer, MD  Brief narrative 53 year old male with history of hypertension, type 2 diabetes mellitus, GERD, cirrhosis of liver associated with alcohol use and hepatitis C, currently on treatment for hepatitis C , remote IV drug use, GERD, COPD, pancytopenia, neuropathy and chronic pain was brought to the hospital by EMS for acute confusion. Patient was very confused in the house and had a fall where he passed out for several minutes. He denies bowel or urinary incontinence but seemed to be confused even when in the ED. Vitals in the ED were stable. Head CT was unremarkable. Blood work done showed elevated ammonia level and urine drug screen positive for cocaine. Patient was back to his baseline mental status when seen by the admitting hospitalist. Patient admitted for further workup.  Assessment/Plan: Acute encephalopathy I think this is likely due to hepatic encephalopathy. Patient reports being on lactulose in the past but has stopped. He does report daily bowel movements. I have started him on lactulose twice a day titrating to at least 2-3 bowel movements daily. Seen by GI and started him on rifaximin. LFTs are fairly normal. -Mental status now at baseline. Will monitor for another day. -MRI of the brain negative for acute infarct. -If patient clinically stable and he will follow-up with GI as outpatient.  Hepatitis C / EtOH cirrhosis Patient follows with GI as outpatient. He sees a hepatologist in [place] for his hep C. He has been compliant with his medications. LFTs are normal. Patient's hepatitis C medications have been resumed. Patient appears compensated at this time. Continue Lasix and Aldactone.  Epigastric and right upper quadrant pain Patient reports this to be ongoing for past 2 weeks and has progressively worsened. Patient has a right upper quadrant tenderness on exam.  Patient had an ultrasound of his abdomen to weeks back which showed cholelithiasis. Given his persistent and worsened symptoms I will repeat an ultrasound of his abdomen.   Pain over right little toe  obtain x ray foot to r/o fracture  GERD Continue Protonix  Type 2 diabetes mellitus Continue Lantus 10 units at bedtime and sliding scale insulin.  Thrombocytopenia/pancytopenia Likely secondary to cirrhosis and medications for hep C. Currently stable at low level. No signs of bleeding  History of cocaine use Patient reports noting cocaine about 4 days ago and is very embarrassed about it. He promises that he will never does cocaine again. Also requests not to mention it to his wife or any of his family members.    Code Status: Full code Family Communication: None at bedside Disposition Plan: Home possibly in a.m. if ultrasound abdomen normal and no for the mental status changes.   Consultants:  GI  Procedures:  MRI brain  Ultrasound abdomen (pending)  Antibiotics:  None  HPI/Subjective: Since seen and examined. Denies any headache or confusion. Reports right upper quadrant pain.  Objective: Filed Vitals:   08/01/14 0545  BP: 162/80  Pulse: 85  Temp: 97.8 F (36.6 C)  Resp: 20    Intake/Output Summary (Last 24 hours) at 08/01/14 1406 Last data filed at 08/01/14 0600  Gross per 24 hour  Intake     60 ml  Output    320 ml  Net   -260 ml   Filed Weights   07/31/14 1209 07/31/14 2056  Weight: 133.811 kg (295 lb) 133.8 kg (294 lb 15.6 oz)    Exam:   General:  Middle aged  obese male in no acute distress  HEENT: No pallor, no icterus, most oral mucosa  Chest: Clear to auscultation bilaterally  CVS: Normal S1 and S2, no murmurs  Abdomen: Soft, nondistended, bowel sounds present, right upper quadrant tenderness to pressure  Extremities: Warm, no edema  CNS: Alert and oriented, no tremors    Data Reviewed: Basic Metabolic Panel:  Recent  Labs Lab 07/30/14 0550 07/31/14 1220  NA 140 142  K 3.6 4.6  CL 114* 108  CO2 23 23  GLUCOSE 132* 176*  BUN 18 18  CREATININE 0.67 0.74  CALCIUM 8.1* 8.7   Liver Function Tests:  Recent Labs Lab 07/30/14 0550 07/31/14 1220  AST 41* 41*  ALT 21 22  ALKPHOS 76 97  BILITOT 1.9* 1.8*  PROT 6.0 7.1  ALBUMIN 2.8* 3.0*    Recent Labs Lab 07/31/14 1220  LIPASE 78*    Recent Labs Lab 07/31/14 1403  AMMONIA 67*   CBC:  Recent Labs Lab 07/30/14 0550 07/31/14 1220  WBC 3.5* 3.3*  NEUTROABS  --  2.1  HGB 9.9* 10.7*  HCT 30.2* 32.9*  MCV 96.8 97.3  PLT 54* 54*   Cardiac Enzymes:  Recent Labs Lab 07/31/14 1220  TROPONINI <0.30   BNP (last 3 results)  Recent Labs  10/24/13 2149 01/16/14 1450 07/31/14 1220  PROBNP 374.8* 153.4* 582.6*   CBG:  Recent Labs Lab 07/31/14 1233 07/31/14 2303 08/01/14 0852 08/01/14 1154  GLUCAP 160* 207* 157* 233*    No results found for this or any previous visit (from the past 240 hour(s)).   Studies: Dg Chest 2 View  07/31/2014   CLINICAL DATA:  Edema, disoriented  EXAM: CHEST  2 VIEW  COMPARISON:  01/29/2014  FINDINGS: The heart size and mediastinal contours are stable. Both lungs are clear. The visualized skeletal structures are unremarkable.  IMPRESSION: No active cardiopulmonary disease.   Electronically Signed   By: Kathreen Devoid   On: 07/31/2014 14:22   Ct Head Wo Contrast  07/31/2014   CLINICAL DATA:  Confusion and altered mental status. Fall in bathroom this morning. Edema.  EXAM: CT HEAD WITHOUT CONTRAST  TECHNIQUE: Contiguous axial images were obtained from the base of the skull through the vertex without intravenous contrast.  COMPARISON:  None.  FINDINGS: No intracranial hemorrhage, mass effect, or midline shift. No hydrocephalus. The basilar cisterns are patent. No evidence of territorial infarct. No intracranial fluid collection. Calvarium is intact. Trace mucosal thickening of right maxillary sinus,  mastoid air cells are well aerated.  IMPRESSION: No acute intracranial process.   Electronically Signed   By: Jeb Levering M.D.   On: 07/31/2014 14:23   Mr Brain Wo Contrast  08/01/2014   CLINICAL DATA:  53 year old diabetic hypertensive male with history of cirrhosis, hepatitis and IV drug abuse presenting with confusion and syncopal episode with recent fall. Subsequent encounter.  EXAM: MRI HEAD WITHOUT CONTRAST  TECHNIQUE: Multiplanar, multiecho pulse sequences of the brain and surrounding structures were obtained without intravenous contrast.  COMPARISON:  07/31/2014 CT.  FINDINGS: Exam is slightly limited secondary to patient's habitus.  No acute infarct.  No intracranial hemorrhage.  T1 slight increased signal within the inferior aspect of the globus pallidus/ cerebral peduncles. This has been described in patients with liver disease (patient with elevated ammonia level).  Scattered punctate and patchy white matter type changes probably related to result of small vessel disease.  Major intracranial vascular structures are patent.  Decreased signal intensity of bone marrow may  be related to anemia.  Minimal exophthalmos.  Cervical medullary junction, pituitary region and pineal region unremarkable.  Minimal paranasal sinus mucosal thickening greater on the right.  IMPRESSION: Findings consistent with hepatocellular disease as noted above.  Mild small vessel disease type changes suspected.  No acute infarct.  Bone marrow changes may reflect result of patient's underlying anemia.   Electronically Signed   By: Chauncey Cruel M.D.   On: 08/01/2014 08:41    Scheduled Meds: . Daclatasvir Dihydrochloride  60 mg Oral QPM  . furosemide  20 mg Oral Daily  . insulin aspart  0-9 Units Subcutaneous TID WC  . insulin glargine  10 Units Subcutaneous QHS  . pantoprazole  40 mg Oral Daily  . ribavirin  600 mg Oral BID  . rifaximin  550 mg Oral BID  . Sofosbuvir  400 mg Oral QPM  . spironolactone  100 mg Oral  Daily   Continuous Infusions: . sodium chloride 10 mL/hr at 07/31/14 2100     Time spent: 25 minutes    Carloyn Lahue, [place]  [facility] Pager [phone]. If 7PM-7AM, please contact night-coverage at [email or web address], password [facility] 08/01/2014, 2:06 PM  LOS: 1 day

## 2014-08-01 NOTE — Care Management Utilization Note (Signed)
UR complete 

## 2014-08-02 ENCOUNTER — Ambulatory Visit (HOSPITAL_COMMUNITY): Payer: Medicare Other

## 2014-08-02 DIAGNOSIS — B182 Chronic viral hepatitis C: Secondary | ICD-10-CM | POA: Insufficient documentation

## 2014-08-02 DIAGNOSIS — R1011 Right upper quadrant pain: Secondary | ICD-10-CM

## 2014-08-02 DIAGNOSIS — K7469 Other cirrhosis of liver: Secondary | ICD-10-CM

## 2014-08-02 DIAGNOSIS — K7460 Unspecified cirrhosis of liver: Secondary | ICD-10-CM

## 2014-08-02 DIAGNOSIS — E119 Type 2 diabetes mellitus without complications: Secondary | ICD-10-CM

## 2014-08-02 LAB — GLUCOSE, CAPILLARY
GLUCOSE-CAPILLARY: 150 mg/dL — AB (ref 70–99)
Glucose-Capillary: 300 mg/dL — ABNORMAL HIGH (ref 70–99)

## 2014-08-02 LAB — CBC
HEMATOCRIT: 31.3 % — AB (ref 39.0–52.0)
HEMOGLOBIN: 10.4 g/dL — AB (ref 13.0–17.0)
MCH: 32 pg (ref 26.0–34.0)
MCHC: 33.2 g/dL (ref 30.0–36.0)
MCV: 96.3 fL (ref 78.0–100.0)
Platelets: 51 10*3/uL — ABNORMAL LOW (ref 150–400)
RBC: 3.25 MIL/uL — AB (ref 4.22–5.81)
RDW: 14.1 % (ref 11.5–15.5)
WBC: 4.6 10*3/uL (ref 4.0–10.5)

## 2014-08-02 MED ORDER — RIFAXIMIN 550 MG PO TABS: 550.0000 mg | ORAL_TABLET | Freq: Two times a day (BID) | ORAL | Status: DC

## 2014-08-02 MED ORDER — HYDROCODONE-ACETAMINOPHEN 5-325 MG PO TABS
1.0000 | ORAL_TABLET | ORAL | Status: DC | PRN
  Administered 2014-08-02 (×2): 1 via ORAL
  Filled 2014-08-02 (×2): qty 1

## 2014-08-02 MED ORDER — LACTULOSE 10 G PO PACK: 10.0000 g | PACK | Freq: Three times a day (TID) | ORAL | Status: DC

## 2014-08-02 NOTE — Care Management Note (Signed)
    Page 1 of 1   08/02/2014     11:13:36 AM CARE MANAGEMENT NOTE 08/02/2014  Patient:  [name], [name]   Account Number:  [id number]  Date Initiated:  08/02/2014  Documentation initiated by:  Theophilus Kinds  Subjective/Objective Assessment:   Pt admitted from home with acute encephalopathy. Pt lives with his wife and will return home at discharge. Pt is independent with ADL's.     Action/Plan:   No CM needs noted. Anticipate discharge within 24 hours.   Anticipated DC Date:  08/03/2014   Anticipated DC Plan:  [place]  CM consult      Choice offered to / List presented to:             Status of service:  Completed, signed off Medicare Important Message given?   (If response is "NO", the following Medicare IM given date fields will be blank) Date Medicare IM given:   Medicare IM given by:   Date Additional Medicare IM given:   Additional Medicare IM given by:    Discharge Disposition:  HOME/SELF CARE  Per UR Regulation:    If discussed at Long Length of Stay Meetings, dates discussed:    Comments:  08/02/14 [place], RN BSN CM

## 2014-08-02 NOTE — Progress Notes (Signed)
Pt d/c home this shift with wife present at facility to transport pt home. D/C instructions given to pt & his wife. Both confirmed understanding of the pt's f/u apts & the importance of following his medication regimen as ordered by the MD. IV catheter removed from pt's LEFT arm, catheter tip intact w/no s/s of infection noted at this time. Pt escorted out of facility via w/c by this writer & assisted into vehicle.

## 2014-08-02 NOTE — Discharge Summary (Signed)
Physician Discharge Summary  [name] [id number]:[id number] DOB: 1961/03/23 DOA: 07/31/2014  PCP: Redge Gainer, MD  Admit date: 07/31/2014 Discharge date: 08/02/2014  Time spent: 40 minutes  Recommendations for Outpatient Follow-up:  1. Follow-up with primary care physician as outpatient.  Discharge Diagnoses:  Active Problems:   Hepatic cirrhosis   Other pancytopenia   Mental status change   Altered mental status   Diabetes   Right upper quadrant abdominal pain   Chronic hepatitis C with cirrhosis   Discharge Condition: Stable  Diet recommendation: Heart healthy/carbohydrate modified diet  Filed Weights   07/31/14 1209 07/31/14 2056  Weight: 133.811 kg (295 lb) 133.8 kg (294 lb 15.6 oz)    History of present illness:  53 year old male who  has a past medical history of Hypertension; Diabetes mellitus; GERD (gastroesophageal reflux disease); Hemorrhoids, internal; BMI 40.0-44.9, adult (OCT 2010 269 LBS); Cirrhosis (NOV 2010 CHILD PUGH A); IV drug abuse (REMOTE); Hepatitis (2010 HEP C); Gallstone (AUG 2012 1 CM); GERD (10/25/2009); COPD (chronic obstructive pulmonary disease); Hepatitis C; Pancytopenia (2013); Splenomegaly (2013); Other pancytopenia (02/18/2013); Iron (Fe) deficiency anemia (02/18/2013); Splenomegaly (02/18/2013); Neuropathy; Biliary dyskinesia (JUL 2015); and Chronic pain. Today he was brought to the ED for evaluation of confusion. Patient was confused when he arrived here wife told EMS that he has been combative and confused for past 3 days. When he arrived he was confused and agitated, but when I saw the patient he had improved and was back to his baseline. Patient is alert and oriented 3. He has a history of hepatitis C and has been followed by gastroenterology as outpatient. He says his medications for hepatitis C were changed almost 3 months ago. He also admits to passing out, denies any chest pain or shortness of breath no nausea vomiting. He had one loose poor  movement this morning but denies any blood in the stool. Patient had urine drug screen which was positive for cocaine, he admits to doing cocaine 4 days ago  Hospital Course:   Acute hepatic encephalopathy -Presented with confusion, Patient reports being on lactulose in the past but reported noncompliance.  -He does report daily bowel movements.  -MRI of the brain negative for acute infarct. -Patient back to his baseline, he is awake, alert and oriented 3. -Started on lactulose and Xifaxan on discharge. -Mental status now at baseline. Will monitor for another day.  Hepatitis C / EtOH cirrhosis -Patient follows with GI as outpatient. He sees a hepatologist in [place] for his hep C.  -He has been compliant with his medications.  LFTs are normal. Patient's hepatitis C medications have been resumed. Patient appears compensated at this time. Continue Lasix and Aldactone.  Epigastric and right upper quadrant pain Patient reports this to be ongoing for past 2 weeks and has progressively worsened. Patient has a right upper quadrant tenderness on exam. Patient had an ultrasound of his abdomen to weeks back which showed cholelithiasis. Given his persistent and worsened symptoms I will repeat an ultrasound of his abdomen.  Pain over right little toe obtain x ray foot to r/o fracture  GERD Continue Protonix  Type 2 diabetes mellitus Continue Lantus 10 units at bedtime and sliding scale insulin.  Thrombocytopenia/pancytopenia Likely secondary to cirrhosis and medications for hep C. Currently stable at low level. No signs of bleeding  History of cocaine use Patient reports noting cocaine about 4 days ago and is very embarrassed about it.  He promises that he will never does cocaine again. Also requests not  to mention it to his wife or any of his family members.   Procedures:  None  Consultations:  GI  Discharge Exam: Filed Vitals:   08/01/14 2013  BP: 169/68  Pulse: 76   Temp: 98.9 F (37.2 C)  Resp: 20   General: Alert and awake, oriented x3, not in any acute distress. HEENT: anicteric sclera, pupils reactive to light and accommodation, EOMI CVS: S1-S2 clear, no murmur rubs or gallops Chest: clear to auscultation bilaterally, no wheezing, rales or rhonchi Abdomen: soft nontender, nondistended, normal bowel sounds, no organomegaly Extremities: no cyanosis, clubbing or edema noted bilaterally Neuro: Cranial nerves II-XII intact, no focal neurological deficits  Discharge Instructions   Discharge Instructions    Diet - low sodium heart healthy    Complete by:  As directed      Increase activity slowly    Complete by:  As directed           Current Discharge Medication List    START taking these medications   Details  lactulose (CEPHULAC) 10 G packet Take 1 packet (10 g total) by mouth 3 (three) times daily. Qty: 30 each, Refills: 0    rifaximin (XIFAXAN) 550 MG TABS tablet Take 1 tablet (550 mg total) by mouth 2 (two) times daily. Qty: 60 tablet, Refills: 5      CONTINUE these medications which have NOT CHANGED   Details  acetaminophen (TYLENOL) 500 MG tablet Take 1,000 mg by mouth every 6 (six) hours as needed for mild pain or headache.     albuterol (PROVENTIL HFA;VENTOLIN HFA) 108 (90 BASE) MCG/ACT inhaler Inhale 2 puffs into the lungs every 6 (six) hours as needed. Shortness of breath    DAKLINZA 60 MG TABS Take 60 mg by mouth every evening.     esomeprazole (NEXIUM) 40 MG capsule Take 1 capsule (40 mg total) by mouth 2 (two) times daily before a meal. Qty: 60 capsule, Refills: 5    furosemide (LASIX) 20 MG tablet TAKE 1 TABLET EVERY DAY Qty: 30 tablet, Refills: 1    glipiZIDE (GLUCOTROL) 10 MG tablet TAKE 1 TABLET EVERY DAY Qty: 30 tablet, Refills: 0    hydrOXYzine (ATARAX/VISTARIL) 10 MG tablet Take 10 mg by mouth every 4 (four) hours as needed for itching.  Refills: 6    insulin glargine (LANTUS) 100 UNIT/ML injection  Inject 10 Units into the skin at bedtime.    !! Oxycodone HCl 10 MG TABS Take 1 or 2 tablets every 4 hours to control pain. Qty: 120 tablet, Refills: 0   Associated Diagnoses: Flank pain    !! Oxycodone HCl 10 MG TABS Take 1 or 2 tablets every 4 hours to control pain Qty: 120 tablet, Refills: 0    ribavirin (REBETOL) 200 MG capsule Take 600 mg by mouth 2 (two) times daily.     SOVALDI 400 MG TABS Take 400 mg by mouth every evening.     spironolactone (ALDACTONE) 50 MG tablet Take 2 tablets (100 mg total) by mouth daily. Qty: 30 tablet, Refills: 2     !! - Potential duplicate medications found. Please discuss with provider.    STOP taking these medications     cephALEXin (KEFLEX) 500 MG capsule        Allergies  Allergen Reactions  . Penicillins Hives   Follow-up Information    Follow up with Redge Gainer, MD In 1 week.   Specialty:  Family Medicine   Contact information:   [address]  [place] [address] [phone]        The results of significant diagnostics from this hospitalization (including imaging, microbiology, ancillary and laboratory) are listed below for reference.    Significant Diagnostic Studies: Dg Chest 2 View  07/31/2014   CLINICAL DATA:  Edema, disoriented  EXAM: CHEST  2 VIEW  COMPARISON:  01/29/2014  FINDINGS: The heart size and mediastinal contours are stable. Both lungs are clear. The visualized skeletal structures are unremarkable.  IMPRESSION: No active cardiopulmonary disease.   Electronically Signed   By: Kathreen Devoid   On: 07/31/2014 14:22   Ct Head Wo Contrast  07/31/2014   CLINICAL DATA:  Confusion and altered mental status. Fall in bathroom this morning. Edema.  EXAM: CT HEAD WITHOUT CONTRAST  TECHNIQUE: Contiguous axial images were obtained from the base of the skull through the vertex without intravenous contrast.  COMPARISON:  None.  FINDINGS: No intracranial hemorrhage, mass effect, or midline shift. No hydrocephalus. The  basilar cisterns are patent. No evidence of territorial infarct. No intracranial fluid collection. Calvarium is intact. Trace mucosal thickening of right maxillary sinus, mastoid air cells are well aerated.  IMPRESSION: No acute intracranial process.   Electronically Signed   By: Jeb Levering M.D.   On: 07/31/2014 14:23   Mr Brain Wo Contrast  08/01/2014   CLINICAL DATA:  53 year old diabetic hypertensive male with history of cirrhosis, hepatitis and IV drug abuse presenting with confusion and syncopal episode with recent fall. Subsequent encounter.  EXAM: MRI HEAD WITHOUT CONTRAST  TECHNIQUE: Multiplanar, multiecho pulse sequences of the brain and surrounding structures were obtained without intravenous contrast.  COMPARISON:  07/31/2014 CT.  FINDINGS: Exam is slightly limited secondary to patient's habitus.  No acute infarct.  No intracranial hemorrhage.  T1 slight increased signal within the inferior aspect of the globus pallidus/ cerebral peduncles. This has been described in patients with liver disease (patient with elevated ammonia level).  Scattered punctate and patchy white matter type changes probably related to result of small vessel disease.  Major intracranial vascular structures are patent.  Decreased signal intensity of bone marrow may be related to anemia.  Minimal exophthalmos.  Cervical medullary junction, pituitary region and pineal region unremarkable.  Minimal paranasal sinus mucosal thickening greater on the right.  IMPRESSION: Findings consistent with hepatocellular disease as noted above.  Mild small vessel disease type changes suspected.  No acute infarct.  Bone marrow changes may reflect result of patient's underlying anemia.   Electronically Signed   By: Chauncey Cruel M.D.   On: 08/01/2014 08:41   US Abdomen Complete  07/19/2014   CLINICAL DATA:  Right upper quadrant pain. Cirrhosis. Cholelithiasis. Hepatitis.  EXAM: ULTRASOUND ABDOMEN COMPLETE  COMPARISON:  CT scan dated  04/11/2014 and ultrasound dated 01/30/2014  FINDINGS: Gallbladder: There is sludge and stones in the gallbladder. Negative sonographic Murphy's sign. Gallbladder wall is minimally thickened at 2.6 mm.  Common bile duct: Diameter: Normal.  5.1 mm in diameter.  Liver: The liver is nodular and coarsely echogenic without focal lesions. No dilated intrahepatic bile ducts. Normal flow in the portal vein.  IVC: No abnormality visualized.  Pancreas: Not visualized.  Spleen: Splenomegaly, 20.8 cm in length.  Volume is 1678 cubic cm.  Right Kidney: Length: 12.2 cm. Echogenicity within normal limits. No mass or hydronephrosis visualized.  Left Kidney: Length: 13.8 cm. Echogenicity within normal limits. No mass or hydronephrosis visualized.  Abdominal aorta: No aneurysm visualized.  Other findings: Small amount of ascites.  IMPRESSION: Cholelithiasis.  Cirrhosis.  Splenomegaly.  No acute findings.   Electronically Signed   By: Rozetta Nunnery M.D.   On: 07/19/2014 18:56   Dg Foot 2 Views Right  08/01/2014   CLINICAL DATA:  53 year old male with right foot and toe pain since yesterday. He had a syncopal event and fall.  EXAM: RIGHT FOOT - 2 VIEW  COMPARISON:  None.  FINDINGS: Remote healed fracture of the fourth metatarsal. No evidence of acute fracture or malalignment. Mild osteoarthritis in the tibiotalar joint on the lateral view. Normal bony mineralization. No lytic or blastic osseous lesion. Mild soft tissue swelling over the medial and lateral aspect of the foot on the frontal view.  IMPRESSION: Mild soft tissue swelling without evidence of acute fracture or malalignment.  Remote healed fracture of the fourth metatarsal.   Electronically Signed   By: Jacqulynn Cadet M.D.   On: 08/01/2014 17:07   US Abdomen Limited Ruq  08/02/2014   CLINICAL DATA:  Abdominal pain.  EXAM: US ABDOMEN LIMITED - RIGHT UPPER QUADRANT  COMPARISON:  10/25/2013.  FINDINGS: Gallbladder:  Sludge is noted gallbladder. Gallbladder wall  thickening at 4.3 mm noted. Non mobile 8 mm density is again noted consistent polyp. Gallstones cannot be excluded. Clinical small amount of pericholecystic fluid.  Common bile duct:  Diameter: 5.5 mm  Liver:  The liver is is echogenic with a slightly nodular contour suggesting cirrhosis.  IMPRESSION: 1. Sludge within the gallbladder. Gallstones cannot be excluded. 8 mm polyp. Gallbladder wall thickening to 4.3 mm. Small amount of pericholecystic fluid cannot be excluded. Gallbladder wall thickening with adjacent mild fluid can be seen with cholecystitis. Hypoproteinemic state could also present in this fashion . 2. Liver is echogenic with slightly nodular contour suggesting cirrhosis.   Electronically Signed   By: Marcello Moores  Register   On: 08/02/2014 09:25    Microbiology: No results found for this or any previous visit (from the past 240 hour(s)).   Labs: Basic Metabolic Panel:  Recent Labs Lab 07/30/14 0550 07/31/14 1220  NA 140 142  K 3.6 4.6  CL 114* 108  CO2 23 23  GLUCOSE 132* 176*  BUN 18 18  CREATININE 0.67 0.74  CALCIUM 8.1* 8.7   Liver Function Tests:  Recent Labs Lab 07/30/14 0550 07/31/14 1220  AST 41* 41*  ALT 21 22  ALKPHOS 76 97  BILITOT 1.9* 1.8*  PROT 6.0 7.1  ALBUMIN 2.8* 3.0*    Recent Labs Lab 07/31/14 1220  LIPASE 78*    Recent Labs Lab 07/31/14 1403  AMMONIA 67*   CBC:  Recent Labs Lab 07/30/14 0550 07/31/14 1220 08/02/14 0559  WBC 3.5* 3.3* 4.6  NEUTROABS  --  2.1  --   HGB 9.9* 10.7* 10.4*  HCT 30.2* 32.9* 31.3*  MCV 96.8 97.3 96.3  PLT 54* 54* 51*   Cardiac Enzymes:  Recent Labs Lab 07/31/14 1220  TROPONINI <0.30   BNP: BNP (last 3 results)  Recent Labs  10/24/13 2149 01/16/14 1450 07/31/14 1220  PROBNP 374.8* 153.4* 582.6*   CBG:  Recent Labs Lab 08/01/14 0852 08/01/14 1154 08/01/14 1634 08/01/14 2051 08/02/14 0736  GLUCAP 157* 233* 170* 139* 150*       Signed:  Javen Ridings A  [facility] 08/02/2014, 11:36 AM

## 2014-08-02 NOTE — Progress Notes (Signed)
Subjective:  Feels much better. Mental alert and at baseline. Complain of RUQ fullness/soreness unaffected by meals. No n/v.  Objective: Vital signs in last 24 hours: Temp:  [98.2 F (36.8 C)-98.9 F (37.2 C)] 98.9 F (37.2 C) (12/22 2013) Pulse Rate:  [76-83] 76 (12/22 2013) Resp:  [20] 20 (12/22 2013) BP: (162-169)/(68-75) 169/68 mmHg (12/22 2013) SpO2:  [100 %] 100 % (12/22 2013) Last BM Date: 08/01/14 General:   Alert,  Well-developed, well-nourished, pleasant and cooperative in NAD Head:  Normocephalic and atraumatic. Eyes:  Sclera clear, no icterus.   Abdomen:  Soft, nontender and nondistended. Normal bowel sounds, without guarding, and without rebound.   Extremities:  Without clubbing, deformity. Trace edema. Neurologic:  Alert and  oriented x4;  grossly normal neurologically. Skin:  Intact without significant lesions or rashes. Psych:  Alert and cooperative. Normal mood and affect.  Intake/Output from previous day: 12/22 0701 - 12/23 0700 In: 240 [P.O.:240] Out: 600 [Urine:600] Intake/Output this shift:    Lab Results: CBC  Recent Labs  07/31/14 1220 08/02/14 0559  WBC 3.3* 4.6  HGB 10.7* 10.4*  HCT 32.9* 31.3*  MCV 97.3 96.3  PLT 54* 51*   BMET  Recent Labs  07/31/14 1220  NA 142  K 4.6  CL 108  CO2 23  GLUCOSE 176*  BUN 18  CREATININE 0.74  CALCIUM 8.7   LFTs  Recent Labs  07/31/14 1220  BILITOT 1.8*  BILIDIR 0.7*  IBILI 1.1*  ALKPHOS 97  AST 41*  ALT 22  PROT 7.1  ALBUMIN 3.0*    Recent Labs  07/31/14 1220  LIPASE 78*   PT/INR No results for input(s): LABPROT, INR in the last 72 hours.    Imaging Studies: Dg Chest 2 View  07/31/2014   CLINICAL DATA:  Edema, disoriented  EXAM: CHEST  2 VIEW  COMPARISON:  01/29/2014  FINDINGS: The heart size and mediastinal contours are stable. Both lungs are clear. The visualized skeletal structures are unremarkable.  IMPRESSION: No active cardiopulmonary disease.   Electronically Signed    By: Kathreen Devoid   On: 07/31/2014 14:22   Ct Head Wo Contrast  07/31/2014   CLINICAL DATA:  Confusion and altered mental status. Fall in bathroom this morning. Edema.  EXAM: CT HEAD WITHOUT CONTRAST  TECHNIQUE: Contiguous axial images were obtained from the base of the skull through the vertex without intravenous contrast.  COMPARISON:  None.  FINDINGS: No intracranial hemorrhage, mass effect, or midline shift. No hydrocephalus. The basilar cisterns are patent. No evidence of territorial infarct. No intracranial fluid collection. Calvarium is intact. Trace mucosal thickening of right maxillary sinus, mastoid air cells are well aerated.  IMPRESSION: No acute intracranial process.   Electronically Signed   By: Jeb Levering M.D.   On: 07/31/2014 14:23   Mr Brain Wo Contrast  08/01/2014   CLINICAL DATA:  53 year old diabetic hypertensive male with history of cirrhosis, hepatitis and IV drug abuse presenting with confusion and syncopal episode with recent fall. Subsequent encounter.  EXAM: MRI HEAD WITHOUT CONTRAST  TECHNIQUE: Multiplanar, multiecho pulse sequences of the brain and surrounding structures were obtained without intravenous contrast.  COMPARISON:  07/31/2014 CT.  FINDINGS: Exam is slightly limited secondary to patient's habitus.  No acute infarct.  No intracranial hemorrhage.  T1 slight increased signal within the inferior aspect of the globus pallidus/ cerebral peduncles. This has been described in patients with liver disease (patient with elevated ammonia level).  Scattered punctate and patchy white matter type  changes probably related to result of small vessel disease.  Major intracranial vascular structures are patent.  Decreased signal intensity of bone marrow may be related to anemia.  Minimal exophthalmos.  Cervical medullary junction, pituitary region and pineal region unremarkable.  Minimal paranasal sinus mucosal thickening greater on the right.  IMPRESSION: Findings consistent with  hepatocellular disease as noted above.  Mild small vessel disease type changes suspected.  No acute infarct.  Bone marrow changes may reflect result of patient's underlying anemia.   Electronically Signed   By: Chauncey Cruel M.D.   On: 08/01/2014 08:41   US Abdomen Complete  07/19/2014   CLINICAL DATA:  Right upper quadrant pain. Cirrhosis. Cholelithiasis. Hepatitis.  EXAM: ULTRASOUND ABDOMEN COMPLETE  COMPARISON:  CT scan dated 04/11/2014 and ultrasound dated 01/30/2014  FINDINGS: Gallbladder: There is sludge and stones in the gallbladder. Negative sonographic Murphy's sign. Gallbladder wall is minimally thickened at 2.6 mm.  Common bile duct: Diameter: Normal.  5.1 mm in diameter.  Liver: The liver is nodular and coarsely echogenic without focal lesions. No dilated intrahepatic bile ducts. Normal flow in the portal vein.  IVC: No abnormality visualized.  Pancreas: Not visualized.  Spleen: Splenomegaly, 20.8 cm in length.  Volume is 1678 cubic cm.  Right Kidney: Length: 12.2 cm. Echogenicity within normal limits. No mass or hydronephrosis visualized.  Left Kidney: Length: 13.8 cm. Echogenicity within normal limits. No mass or hydronephrosis visualized.  Abdominal aorta: No aneurysm visualized.  Other findings: Small amount of ascites.  IMPRESSION: Cholelithiasis.  Cirrhosis.  Splenomegaly.  No acute findings.   Electronically Signed   By: Rozetta Nunnery M.D.   On: 07/19/2014 18:56   Dg Foot 2 Views Right  08/01/2014   CLINICAL DATA:  53 year old male with right foot and toe pain since yesterday. He had a syncopal event and fall.  EXAM: RIGHT FOOT - 2 VIEW  COMPARISON:  None.  FINDINGS: Remote healed fracture of the fourth metatarsal. No evidence of acute fracture or malalignment. Mild osteoarthritis in the tibiotalar joint on the lateral view. Normal bony mineralization. No lytic or blastic osseous lesion. Mild soft tissue swelling over the medial and lateral aspect of the foot on the frontal view.  IMPRESSION:  Mild soft tissue swelling without evidence of acute fracture or malalignment.  Remote healed fracture of the fourth metatarsal.   Electronically Signed   By: Jacqulynn Cadet M.D.   On: 08/01/2014 17:07  [2 weeks]   Assessment: 53 year old male with HCV/ETOH cirrhosis, admitted with hepatic encephalopathy in the setting of recent cocaine use and non-compliance with lactulose dosing. WIFE IS NOT AWARE OF ILLICIT DRUG USE AND PATIENT WOULD LIKE TO KEEP THIS CONFIDENTIAL. Clinically improved since admission. At baseline.    Lactulose currently 5g daily, titrating to 3 soft BMs daily. Will add Xifaxan, although insurance may not cover this as an outpatient. We will attempt prior authorization as an outpatient if necessary.   Patient reports RUQ tenderness which is states is like fullness or stretching type of pain unrelated to meals. RUQ u/s done this morning, results pending. H/o cholelithiasis.    Plan: 1. Continue Hep C treatments as outlined. 2. Lactulose titrate to 3 soft BMs daily. 3. Xifaxan 550mg  po BID, I have done RX so P.A. will come to use if needed. 4. Avoid illicit drugs. 5. Next EGD for variceal surveillance 11/2014.  6. F/U pending RUQ U/S.   LOS: 2 days   [name]  08/02/2014, 8:00 AM

## 2014-08-02 NOTE — Progress Notes (Signed)
Pt NPO at this time & requesting something to eat. ABD ultrasound completed this morning. MD notified & new order given to advance pt's diet to carb mod./low sodium. Pt aware. Dietary notified & is bringing pt some food at this time.

## 2014-08-07 ENCOUNTER — Other Ambulatory Visit (HOSPITAL_COMMUNITY): Payer: Self-pay | Admitting: Hematology & Oncology

## 2014-08-07 ENCOUNTER — Telehealth (HOSPITAL_COMMUNITY): Payer: Self-pay | Admitting: Oncology

## 2014-08-07 ENCOUNTER — Telehealth (HOSPITAL_COMMUNITY): Payer: Self-pay | Admitting: *Deleted

## 2014-08-07 MED ORDER — OXYCODONE HCL 10 MG PO TABS: 5.0000 mg | ORAL_TABLET | ORAL | Status: DC | PRN

## 2014-08-07 NOTE — Telephone Encounter (Signed)
[name] called then presented in person today to request a refill of Oxycodone.  [name] advised that as of today Dr. Whitney Muse will not continue to refill his narcotic prescriptions that Dr. Barnet Glasgow initiated, and that he needs to follow-up with his primary care physician or GI physician, since he relates his chronic pain to abdominal pain unrelated to his hematology care.  [name] did appear to be in pain and rated his abdominal pain at an 8/10, which he relates is about his baseline abdominal pain level as his Oxycodone begins to wear off - Dr. Whitney Muse prescribed him for today with the understanding this will not continue.  [name] verbalized understanding of plan of care regarding narcotic prescribing practices moving forward.  He also states that he sees Dr. Laurance Flatten later this week to re-establish contact with him as his PCP and states he will discuss pain management at that point with him.  [name] also verbalized understanding that he is to continue to come here for his hematology care, and next appointment date/time was reminded.

## 2014-08-08 ENCOUNTER — Telehealth: Payer: Self-pay | Admitting: Family Medicine

## 2014-08-08 NOTE — Telephone Encounter (Signed)
Appointment scheduled for Monday with Dr. Sabra Heck for hospital follow up.

## 2014-08-14 ENCOUNTER — Encounter: Payer: Self-pay | Admitting: Family Medicine

## 2014-08-14 ENCOUNTER — Ambulatory Visit (INDEPENDENT_AMBULATORY_CARE_PROVIDER_SITE_OTHER): Payer: Medicare Other | Admitting: Family Medicine

## 2014-08-14 VITALS — Temp 97.0°F | Ht 66.0 in | Wt 290.6 lb

## 2014-08-14 DIAGNOSIS — B182 Chronic viral hepatitis C: Secondary | ICD-10-CM

## 2014-08-14 MED ORDER — ALBUTEROL SULFATE HFA 108 (90 BASE) MCG/ACT IN AERS: 2.0000 | INHALATION_SPRAY | Freq: Four times a day (QID) | RESPIRATORY_TRACT | Status: DC | PRN

## 2014-08-14 MED ORDER — HYDROXYZINE HCL 10 MG PO TABS: 10.0000 mg | ORAL_TABLET | ORAL | Status: DC | PRN

## 2014-08-14 MED ORDER — LACTULOSE 10 GM/15ML PO SOLN
10.0000 g | Freq: Three times a day (TID) | ORAL | Status: DC
Start: 2014-08-14 — End: 2014-09-26

## 2014-08-14 MED ORDER — GLIPIZIDE 10 MG PO TABS: 10.0000 mg | ORAL_TABLET | Freq: Every day | ORAL | Status: DC

## 2014-08-14 MED ORDER — OMEPRAZOLE 20 MG PO CPDR: 40.0000 mg | DELAYED_RELEASE_CAPSULE | Freq: Every day | ORAL | Status: DC

## 2014-08-14 MED ORDER — OXYCODONE HCL 10 MG PO TABS: 5.0000 mg | ORAL_TABLET | ORAL | Status: DC | PRN

## 2014-08-14 MED ORDER — FUROSEMIDE 20 MG PO TABS: 20.0000 mg | ORAL_TABLET | Freq: Every day | ORAL | Status: DC

## 2014-08-14 NOTE — Patient Instructions (Signed)
Hepatic Encephalopathy °Hepatic encephalopathy is a syndrome. This is a set of symptoms that occur together. It is seen mostly in patients with damage to the liver known as cirrhosis. This is where normal liver tissue has been replaced by scar tissue.  °Symptoms of the syndrome include: °· Changes in personality. °· Mental impairment. °· A depressed level of consciousness. °These changes occur because toxins build up in the bloodstream. The build up occurs because the scarred liver cannot rid toxins from the body. The most important of these toxins is ammonia. Toxins can cause abnormal behavior and confusion. Toxins in the blood stream can impair your ability to take care of yourself or others. Some people become very sleepy and cannot be woken easily. In severe cases, the patient lapses into a coma.  °CAUSES  °There are many things that can cause liver damage that can lead to buildup of toxins. These include: °· Diseases that cause cirrhosis of the liver. °· Long-term alcohol use with progressive liver damage. °· Hepatitis B or C with ongoing infection and liver damage. °· Patients without cirrhosis who have undergone shunt surgery. °· Kidney failure. °· Bleeding in the stomach or intestines. °· Infection. °· Constipation. °· Medications that act upon the central nervous system. °· Diuretic therapy. °· Excessive dietary protein. °SYMPTOMS  °Symptoms of this syndrome are categorized or "staged" based on severity.  °· Stage 0. Minimal hepatic encephalopathy. No detectable changes in personality or behavior. Minimal changes in memory, concentration, mental function, and physical ability. °· Stage 1. Some lack of awareness. Shortened attention span. Problems with addition or subtraction. Possible problems with sleeping or a reversal of the normal sleep pattern. Euphoria, depression, or irritability may be present. Mild confusion. Slowing of mental ability. Tremors may be detected. °· Stage 2. Lethargy or apathy.  Disoriented. Strange behavior. Slurred speech. Obvious tremors. Drowsiness, unable to perform mental tasks. Personality changes, and confusion about time. °· Stage 3. Very sleepy but can be aroused. Unable to perform mental tasks, cannot keep track of time and place, marked confusion, amnesia, occasional fits of rage, speech cannot be understood. °· Stage 4. Coma with or without response to painful stimuli. °DIAGNOSIS  °In mild cases, a careful history and physical exam may lead your caregiver to consider possible mild hepatic encephalopathy as the cause of symptoms. The diagnosis is clearer in more severe cases. An elevated blood ammonia level is the classic blood test abnormality in patients with this syndrome. Other tests can be helpful to rule out other diseases.  °TREATMENT  °· Medications are often used to lower the ammonia level in the blood. This usually leads to improvement. °· Diets containing vegetable proteins are better than diets rich in animal protein, especially proteins derived from red meats. Eating well-cooked chicken and fish in addition to vegetable protein should be discussed with your caregiver. Malnourished patients are encouraged to add liquid nutritional supplements to their diet. °· Antibiotics are sometimes used to try to lessen the volume of bacteria in the intestines that produce ammonia. °· Moderate to severe cases of this syndrome usually require a hospital stay and medicine that is given directly into a vein (intravenously). °HOME CARE INSTRUCTIONS  °The goal at home is to avoid things that can make the condition worse and lead to a buildup of ammonia in the blood. °· Eat a well balanced diet. Your caregiver can help you with suggestions on this. °· Talk to your caregiver before taking vitamin supplements. Large doses of vitamins and minerals,   especially vitamin A, iron, or copper, can worsen liver damage. °· A low salt diet, water restriction, or diuretic medicine may be needed to  reduce fluid retention. °· Avoid alcohol and acetaminophen as well as any over-the-counter medications that contain acetaminophen (check labels). Only take over-the-counter or prescription medicines for pain, discomfort, or fever as directed by your caregiver. °· Avoid drugs that are toxic to the liver. Review your medications (both prescription and non-prescription) with your caregiver to make sure those you are taking will not be harmful. °· Blood tests may be needed. Follow your caregiver's advice regarding the timing of these. °· With this condition you play a critical role in maintaining your own good health. The failure to follow your caregiver's advice and these instructions may result in permanent disability or death. °SEEK MEDICAL CARE IF:  °· You have increasing fatigue or weakness. °· You develop increasing swelling of the abdomen, hands, feet, legs or face. °· You develop loss of appetite. °· You are feeling sick to your stomach (nausea) and vomiting. °· You develop jaundice. This is a yellow discoloration of the skin. °· You develop worsening problems with concentration, confusion, and/or problems with sleep. °SEEK IMMEDIATE MEDICAL CARE IF:  °· You vomit bright red blood or a coffee ground-looking material. °· You have blood in your stools. Or the stools turn black and tarry. °· You have a fever. °· You develop easy bruising or bleeding. °· You have a return of slurred speech, change in behavior, or confusion. °MAKE SURE YOU:  °· Understand these instructions. °· Will watch your condition. °· Will get help right away if you are not doing well or get worse. °Document Released: 10/07/2006 Document Revised: 10/20/2011 Document Reviewed: 07/14/2007 °Exit[employer]® Patient Information ©2015 [employer]. This information is not intended to replace advice given to you by your health care provider. Make sure you discuss any questions you have with your health care provider. ° °

## 2014-08-14 NOTE — Progress Notes (Signed)
   Subjective:    Patient ID: [name], male    DOB: [birth year], 54 y.o.   MRN: [id number]  HPI 54 year old gentleman with chronic alcoholic cirrhosis and hep C. He was admitted to the hospital before Christmas with altered mental status and hepatic encephalopathy. Ammonia level was elevated and he was started on lactulose as well as several other hep C and time to use. Medicine is really expensive and he needs some help with affording. I will refer this to her clinical pharmacy. Since being home he has had no more confusion but did admit to a couple of beers with [place] food last night. Despite him sick and he says that he is not going to drink anymore. He is having issues with diarrhea with the lactulose and we talked about balancing mental status and diarrhea by using the lowest possible dose of lactulose. He is being followed by gastroenterology as well.    Review of Systems  HENT: Negative.   Respiratory: Positive for shortness of breath.   Cardiovascular: Positive for leg swelling.  Gastrointestinal: Positive for abdominal pain, diarrhea and abdominal distention.  Musculoskeletal: Negative.   Psychiatric/Behavioral: Negative.        Objective:   Physical Exam  Constitutional: He is oriented to person, place, and time. He appears well-developed.  Cardiovascular: Normal rate.   Pulmonary/Chest: Effort normal.  Abdominal: He exhibits mass. There is tenderness.  Liver is palpably enlarged and tender  Musculoskeletal: He exhibits edema.  Neurological: He is alert and oriented to person, place, and time. He has normal reflexes.   Temp(Src) 97 F (36.1 C) (Oral)  Ht 5\' 6"  (1.676 m)  Wt 290 lb 9.6 oz (131.815 kg)  BMI 46.93 kg/m2       Assessment & Plan:  1. Chronic hepatitis C without hepatic coma Doing fairly well on medicines. We spent some time talking about abstinence from alcohol. I think this recent episode frightened him and hopefully he will remember that when he  wants an extra week. We'll try to coordinate visits here and those with gastroenterology.  [name] MD - Ammonia - Hepatic function panel

## 2014-08-15 ENCOUNTER — Telehealth: Payer: Self-pay | Admitting: Family Medicine

## 2014-08-15 LAB — HEPATIC FUNCTION PANEL
ALT: 23 IU/L (ref 0–44)
AST: 42 IU/L — AB (ref 0–40)
Albumin: 3.5 g/dL (ref 3.5–5.5)
Alkaline Phosphatase: 105 IU/L (ref 39–117)
BILIRUBIN DIRECT: 0.84 mg/dL — AB (ref 0.00–0.40)
Total Bilirubin: 1.9 mg/dL — ABNORMAL HIGH (ref 0.0–1.2)
Total Protein: 6.8 g/dL (ref 6.0–8.5)

## 2014-08-15 LAB — AMMONIA: Ammonia: 160 ug/dL (ref 27–102)

## 2014-08-16 ENCOUNTER — Other Ambulatory Visit: Payer: Self-pay | Admitting: Pharmacist

## 2014-08-16 NOTE — Telephone Encounter (Signed)
Spoke with Mrs. Maull.  It appears that oxycodone and lactulose were not covered by his Medicare Part D program.  I requested records from last 6 months from [facility] to review copay information and how much patient patient for prescriptions.  There might be 3 options we can look into - if patient has not already applied for extra assistance - look into if he would qualify which would lower copays for his medications.  The other options if to contact his insurance to see if we can request coverage for oxycodone and lactulose.  The last option would be to refer patient to [facility] Patient Assistance Program to see if they might be able to assist in any way but usually they are unable to help much until patient is in Medicare coverage gap.

## 2014-08-16 NOTE — Telephone Encounter (Signed)
I have reviewed records from [facility].  It appears that it was only the cost of oxycodone ($51.12) that patient was concerned about.  Rx from Dr Sabra Heck was filled for #120 oxycodone 10mg  08/14/14.  He also had Rx from Dr Whitney Muse for oxycodone 10mg  #120 filled 08/07/2014.  The one filled 08/14/2014 was not covered by insurance because it was too soon to fill.  When it is next due I anticipate that it will be covered again.  With that said I also had conversation with patient about getting both of these Rx's filled.  He should have enough oxycodone to last until at least 08/26/2014 (#120 oxycodone 1-2 tablets q4 hours = 10 days supply).

## 2014-08-17 ENCOUNTER — Telehealth: Payer: Self-pay | Admitting: *Deleted

## 2014-08-17 NOTE — Telephone Encounter (Signed)
rx changed by pharmacy to ventolin, please disregard note.

## 2014-08-17 NOTE — Telephone Encounter (Signed)
Dr. Sabra Heck, ins co requires [name] to try formulary drugs first instead of proventil unless there is a medical reason that he cannot.  The two formulary drugs she gave me to try are Ventolin or Xopenex inhalers will either of these work if so please send to [facility] [place].  Thanks

## 2014-08-22 NOTE — Telephone Encounter (Signed)
Proventil and Ventolin are the same thing so there is reason we cannot call in that prescription as requested

## 2014-08-27 NOTE — Assessment & Plan Note (Signed)
Followed by GI and on antivirals.

## 2014-08-27 NOTE — Assessment & Plan Note (Addendum)
Stable.  Secondary to cirrhosis of liver. Labs in 3 and 6 months: CBC diff.  Return in 6 months for follow-up.

## 2014-08-27 NOTE — Assessment & Plan Note (Signed)
No longer managed by hematology.

## 2014-08-27 NOTE — Progress Notes (Signed)
-  Rescheduled-  [name]  

## 2014-08-27 NOTE — Assessment & Plan Note (Signed)
Follow-up by GI.

## 2014-08-27 NOTE — Assessment & Plan Note (Addendum)
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  Will continue to monitor iron studies and provide IV iron when indicated.  Labs in 3 and 6 months: iron/TIBC, ferritin.

## 2014-08-28 ENCOUNTER — Telehealth: Payer: Self-pay | Admitting: Family Medicine

## 2014-08-28 ENCOUNTER — Telehealth: Payer: Self-pay | Admitting: *Deleted

## 2014-08-28 NOTE — Telephone Encounter (Signed)
DWM please make this decision.

## 2014-08-28 NOTE — Telephone Encounter (Signed)
You may give him 12 pills and then have him call Dr. Sabra Heck on Thursday

## 2014-08-28 NOTE — Telephone Encounter (Signed)
Needs oxycodone, he is taking 4 to 5 daily, Sabra Heck gave him #120 on 08/14/14. He has 4 left. Sabra Heck will not be here until Thursday

## 2014-08-29 ENCOUNTER — Telehealth: Payer: Self-pay | Admitting: Family Medicine

## 2014-08-29 ENCOUNTER — Ambulatory Visit (HOSPITAL_COMMUNITY): Payer: Medicare Other | Admitting: Oncology

## 2014-08-29 ENCOUNTER — Other Ambulatory Visit (HOSPITAL_COMMUNITY): Payer: Medicare Other

## 2014-08-29 ENCOUNTER — Other Ambulatory Visit: Payer: Self-pay | Admitting: *Deleted

## 2014-08-29 MED ORDER — OXYCODONE HCL 10 MG PO TABS: 5.0000 mg | ORAL_TABLET | ORAL | Status: DC | PRN

## 2014-08-29 NOTE — Telephone Encounter (Signed)
This was ok'd per Dr.Moore, see previous call.

## 2014-08-31 MED ORDER — OXYCODONE HCL 10 MG PO TABS: 5.0000 mg | ORAL_TABLET | ORAL | Status: DC | PRN

## 2014-08-31 NOTE — Assessment & Plan Note (Signed)
No longer managed by hematology.

## 2014-08-31 NOTE — Assessment & Plan Note (Signed)
Follow-up by GI.

## 2014-08-31 NOTE — Assessment & Plan Note (Signed)
Stable.  Secondary to cirrhosis of liver. Labs in 3 and 6 months: CBC diff.  Return in 6 months for follow-up.

## 2014-08-31 NOTE — Assessment & Plan Note (Signed)
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  Will continue to monitor iron studies and provide IV iron when indicated.  Labs in 3 and 6 months: iron/TIBC, ferritin.

## 2014-08-31 NOTE — Telephone Encounter (Signed)
Rx refilled per patient request 

## 2014-08-31 NOTE — Telephone Encounter (Signed)
Patient aware that script is available to pickup with photo ID

## 2014-08-31 NOTE — Telephone Encounter (Signed)
Pt aware.

## 2014-08-31 NOTE — Progress Notes (Signed)
-  Rescheduled-  [name],[name]  

## 2014-08-31 NOTE — Assessment & Plan Note (Signed)
Followed by GI and on antivirals.

## 2014-09-11 ENCOUNTER — Telehealth: Payer: Self-pay

## 2014-09-11 NOTE — Telephone Encounter (Signed)
Pt is calling because he can't afford the Xifaxan and he is out. He is wanting to know what he should do next. Please advise

## 2014-09-11 NOTE — Telephone Encounter (Signed)
Pt will come by to pick up samples and paper work.

## 2014-09-11 NOTE — Telephone Encounter (Signed)
PLEASE CALL PT. He may come and pick up samples and paperwork to get assistance with meds.

## 2014-09-12 ENCOUNTER — Ambulatory Visit (HOSPITAL_COMMUNITY): Payer: Medicare Other | Admitting: Oncology

## 2014-09-12 ENCOUNTER — Other Ambulatory Visit (HOSPITAL_COMMUNITY): Payer: Medicare Other

## 2014-09-14 ENCOUNTER — Telehealth: Payer: Self-pay | Admitting: Family Medicine

## 2014-09-14 ENCOUNTER — Ambulatory Visit (INDEPENDENT_AMBULATORY_CARE_PROVIDER_SITE_OTHER): Payer: Medicare Other | Admitting: Family Medicine

## 2014-09-14 ENCOUNTER — Encounter: Payer: Self-pay | Admitting: Family Medicine

## 2014-09-14 ENCOUNTER — Ambulatory Visit (HOSPITAL_COMMUNITY)
Admission: RE | Admit: 2014-09-14 | Discharge: 2014-09-14 | Disposition: A | Discharge status: Home / Self Care | Payer: Medicare Other | Source: Ambulatory Visit | Attending: Family Medicine | Admitting: Family Medicine

## 2014-09-14 VITALS — BP 170/75 | HR 81 | Temp 97.9°F | Ht 66.0 in | Wt 292.6 lb

## 2014-09-14 DIAGNOSIS — I809 Phlebitis and thrombophlebitis of unspecified site: Secondary | ICD-10-CM | POA: Diagnosis not present

## 2014-09-14 DIAGNOSIS — M79605 Pain in left leg: Secondary | ICD-10-CM | POA: Insufficient documentation

## 2014-09-14 MED ORDER — KETOROLAC TROMETHAMINE 30 MG/ML IJ SOLN
30.0000 mg | Freq: Once | INTRAMUSCULAR | Status: AC
  Administered 2014-09-14: 30 mg via INTRAMUSCULAR

## 2014-09-14 MED ORDER — NAPROXEN 500 MG PO TABS: 500.0000 mg | ORAL_TABLET | Freq: Two times a day (BID) | ORAL | Status: DC

## 2014-09-14 NOTE — Progress Notes (Signed)
   Subjective:    Patient ID: [name], male    DOB: 1960/12/20, 54 y.o.   MRN: [id number]  HPI Patient presents with c/o left leg pain. He has been rx'd oxycodone #120 a few weeks ago and he is out.  He wants refills.    Review of Systems  Constitutional: Negative for fever.  HENT: Negative for ear pain.   Eyes: Negative for discharge.  Respiratory: Negative for cough.   Cardiovascular: Negative for chest pain.  Gastrointestinal: Negative for abdominal distention.  Endocrine: Negative for polyuria.  Genitourinary: Negative for difficulty urinating.  Musculoskeletal: Negative for gait problem and neck pain.  Skin: Negative for color change and rash.  Neurological: Negative for speech difficulty and headaches.  Psychiatric/Behavioral: Negative for agitation.       Objective:    BP 170/75 mmHg  Pulse 81  Temp(Src) 97.9 F (36.6 C) (Oral)  Ht 5\' 6"  (1.676 m)  Wt 292 lb 9.6 oz (132.722 kg)  BMI 47.25 kg/m2 Physical Exam  Constitutional: He is oriented to person, place, and time. He appears well-developed and well-nourished.  HENT:  Head: Normocephalic and atraumatic.  Mouth/Throat: Oropharynx is clear and moist.  Eyes: Pupils are equal, round, and reactive to light.  Neck: Normal range of motion. Neck supple.  Cardiovascular: Normal rate and regular rhythm.   No murmur heard. Pulmonary/Chest: Effort normal and breath sounds normal.  Abdominal: Soft. Bowel sounds are normal. There is no tenderness.  Musculoskeletal:  TTP left calf with varicose veins and swelling.  Neurological: He is alert and oriented to person, place, and time.  Skin: Skin is warm and dry.  Psychiatric: He has a normal mood and affect.          Assessment & Plan:     ICD-9-CM ICD-10-CM   1. Left leg pain 729.5 M79.605 ketorolac (TORADOL) 30 MG/ML injection 30 mg     US Venous Img Lower Unilateral Left     naproxen (NAPROSYN) 500 MG tablet  2. Thrombophlebitis 451.9 I80.9 ketorolac  (TORADOL) 30 MG/ML injection 30 mg     US Venous Img Lower Unilateral Left     naproxen (NAPROSYN) 500 MG tablet   Explained it is too early for pain meds and needs to follow up with Dr. Sabra Heck for pain medication.  No Follow-up on file.  Lysbeth Penner FNP

## 2014-09-15 ENCOUNTER — Other Ambulatory Visit: Payer: Self-pay | Admitting: *Deleted

## 2014-09-15 ENCOUNTER — Telehealth: Payer: Self-pay | Admitting: Family Medicine

## 2014-09-15 MED ORDER — OXYCODONE HCL 10 MG PO TABS: 5.0000 mg | ORAL_TABLET | Freq: Four times a day (QID) | ORAL | Status: DC | PRN

## 2014-09-15 NOTE — Telephone Encounter (Signed)
Patient requesting refill on oxycodone 10mg . Discussed with Dr Sabra Heck and obtained a verbal order for 1-2 tablets QID #120. This should last at least 2 weeks.  Will forward to Shelah Lewandowsky to sign hard copy since Dr Sabra Heck is off site.

## 2014-09-15 NOTE — Telephone Encounter (Signed)
Per Cyril Mourning Dr. Sabra Heck will write this upcoming Tuesday, pt aware

## 2014-09-15 NOTE — Telephone Encounter (Signed)
Patient aware to pickup script with ID

## 2014-09-18 NOTE — Telephone Encounter (Signed)
Handled in another encounter. Prescription given.

## 2014-09-19 NOTE — Telephone Encounter (Signed)
Chong Sicilian RN took care of patients Rx and patient has already picked up his prescription. Please see additional encounter.

## 2014-09-20 ENCOUNTER — Other Ambulatory Visit (HOSPITAL_COMMUNITY): Payer: Medicare Other

## 2014-09-20 ENCOUNTER — Ambulatory Visit (HOSPITAL_COMMUNITY): Payer: Medicare Other | Admitting: Oncology

## 2014-09-20 ENCOUNTER — Ambulatory Visit: Payer: Medicare Other | Admitting: Gastroenterology

## 2014-09-20 NOTE — Assessment & Plan Note (Signed)
No longer managed by hematology.

## 2014-09-20 NOTE — Assessment & Plan Note (Signed)
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  Will continue to monitor iron studies and provide IV iron when indicated.  Labs in 3 and 6 months: iron/TIBC, ferritin.

## 2014-09-20 NOTE — Progress Notes (Signed)
This encounter was created in error - please disregard.

## 2014-09-20 NOTE — Assessment & Plan Note (Signed)
Follow-up by GI.

## 2014-09-20 NOTE — Assessment & Plan Note (Signed)
Followed by GI and on antivirals.

## 2014-09-20 NOTE — Assessment & Plan Note (Signed)
Stable.  Secondary to cirrhosis of liver. Labs in 3 and 6 months: CBC diff.  Return in 6 months for follow-up.

## 2014-09-26 ENCOUNTER — Other Ambulatory Visit: Payer: Self-pay | Admitting: Family Medicine

## 2014-09-26 MED ORDER — FUROSEMIDE 20 MG PO TABS: 20.0000 mg | ORAL_TABLET | Freq: Every day | ORAL | Status: DC

## 2014-09-26 MED ORDER — LACTULOSE 10 GM/15ML PO SOLN: 10.0000 g | Freq: Three times a day (TID) | ORAL | Status: DC

## 2014-09-26 NOTE — Telephone Encounter (Signed)
Stp he is aware rx sent to pharmacy, also requested refill on his lasix which was sent over as well.

## 2014-09-28 ENCOUNTER — Other Ambulatory Visit: Payer: Self-pay | Admitting: *Deleted

## 2014-09-28 ENCOUNTER — Telehealth: Payer: Self-pay | Admitting: Family Medicine

## 2014-09-28 MED ORDER — OXYCODONE HCL 10 MG PO TABS: 5.0000 mg | ORAL_TABLET | Freq: Four times a day (QID) | ORAL | Status: DC | PRN

## 2014-09-28 NOTE — Telephone Encounter (Signed)
Pt aware.

## 2014-09-28 NOTE — Telephone Encounter (Signed)
Refilled as requested  

## 2014-10-06 ENCOUNTER — Telehealth: Payer: Self-pay | Admitting: Family Medicine

## 2014-10-06 MED ORDER — OXYCODONE HCL 10 MG PO TABS: ORAL_TABLET | ORAL | Status: DC

## 2014-10-06 NOTE — Telephone Encounter (Signed)
Spoke with patient. 16 tablets of oxycodone give. These will have to last until his appt.  Appt scheduled for 10/10/14. We will discuss pain management and pain contract. He will bring the script from 10/06/14 with him to be shredded since the pharmacy is unable to fill it until 10/14/14 with the current directions.  Patient stated understanding and agreement to plan.

## 2014-10-06 NOTE — Telephone Encounter (Signed)
Can refill hydrocodone enough for the weekend but would like to get him in next week to do a pain med contract

## 2014-10-10 ENCOUNTER — Encounter: Payer: Self-pay | Admitting: Family Medicine

## 2014-10-10 ENCOUNTER — Ambulatory Visit (INDEPENDENT_AMBULATORY_CARE_PROVIDER_SITE_OTHER): Payer: Medicare Other | Admitting: Family Medicine

## 2014-10-10 VITALS — BP 151/79 | HR 88 | Temp 98.2°F | Ht 66.0 in | Wt 293.0 lb

## 2014-10-10 DIAGNOSIS — E119 Type 2 diabetes mellitus without complications: Secondary | ICD-10-CM

## 2014-10-10 DIAGNOSIS — B182 Chronic viral hepatitis C: Secondary | ICD-10-CM

## 2014-10-10 DIAGNOSIS — K7469 Other cirrhosis of liver: Secondary | ICD-10-CM

## 2014-10-10 DIAGNOSIS — K7460 Unspecified cirrhosis of liver: Secondary | ICD-10-CM

## 2014-10-10 DIAGNOSIS — Z114 Encounter for screening for human immunodeficiency virus [HIV]: Secondary | ICD-10-CM | POA: Diagnosis not present

## 2014-10-10 LAB — POCT GLYCOSYLATED HEMOGLOBIN (HGB A1C): Hemoglobin A1C: 5.9

## 2014-10-10 MED ORDER — LACTULOSE 10 GM/15ML PO SOLN
10.0000 g | Freq: Three times a day (TID) | ORAL | Status: DC
Start: 2014-10-10 — End: 2014-12-19

## 2014-10-10 MED ORDER — OXYCODONE HCL 10 MG PO TABS: ORAL_TABLET | ORAL | Status: DC

## 2014-10-10 NOTE — Progress Notes (Signed)
Subjective:    Patient ID: [name], male    DOB: 1961-04-29, 54 y.o.   MRN: [id number]  HPI 54 year old man with chronic hepatitis C with cirrhosis, history of hepatic encephalopathy and diabetes. The purpose of this visit today was to discuss use of narcotic pain medicine and sign a contract. I think his pain is probably real he seems to have been taking more medicine than was prescribed and in an effort to educate him about our concerns we had him come in today and sign a pain contract.  He is following up regarding his hepatitis with a hepatologist in [place] as well as local gastroenterologist in [place]. He denies alcohol use recently. Lactulose seems to be working as far as mentation and side effects.  He does have some shortness of breath when he lays down as his ascites in the abdomen likely compromises his lung volume.  Patient Active Problem List   Diagnosis Date Noted  . Right upper quadrant abdominal pain   . Chronic hepatitis C with cirrhosis   . Mental status change 07/31/2014  . Altered mental status 07/31/2014  . Diabetes 07/31/2014  . Sepsis 01/29/2014  . Encephalopathy, hepatic 01/29/2014  . Angiodysplasia of colon 12/27/2013  . Hematochezia 11/03/2013  . Other pancytopenia 02/18/2013  . Iron (Fe) deficiency anemia 02/18/2013  . Colon cancer screening 08/28/2011  . Abdominal pain 09/05/2010  . DM 02/15/2010  . GERD 10/25/2009  . Hepatic cirrhosis 10/25/2009  . Chronic hepatitis C virus infection 06/05/2009  . ALCOHOL ABUSE 06/05/2009  . UNSPECIFIED DISEASE OF PANCREAS 06/05/2009   Outpatient Encounter Prescriptions as of 10/10/2014  Medication Sig  . acetaminophen (TYLENOL) 500 MG tablet Take 1,000 mg by mouth every 6 (six) hours as needed for mild pain or headache.   . albuterol (PROVENTIL HFA;VENTOLIN HFA) 108 (90 BASE) MCG/ACT inhaler Inhale 2 puffs into the lungs every 6 (six) hours as needed. Shortness of breath  . furosemide (LASIX) 20 MG tablet  Take 1 tablet (20 mg total) by mouth daily.  Marland Kitchen glipiZIDE (GLUCOTROL) 10 MG tablet Take 1 tablet (10 mg total) by mouth daily.  . hydrOXYzine (ATARAX/VISTARIL) 10 MG tablet Take 1 tablet (10 mg total) by mouth every 4 (four) hours as needed for itching.  . insulin glargine (LANTUS) 100 UNIT/ML injection Inject 10 Units into the skin at bedtime.  Marland Kitchen lactulose (CHRONULAC) 10 GM/15ML solution Take 15 mLs (10 g total) by mouth 3 (three) times daily.  Marland Kitchen omeprazole (PRILOSEC) 20 MG capsule Take 2 capsules (40 mg total) by mouth daily.  . Oxycodone HCl 10 MG TABS 1 tablet every 6 hours PRN  . ribavirin (REBETOL) 200 MG capsule Take 600 mg by mouth daily. 2 tablets (400 mg) in the morning; 1 tablet (200 mg) in the evening  . rifaximin (XIFAXAN) 550 MG TABS tablet Take 1 tablet (550 mg total) by mouth 2 (two) times daily.  Marland Kitchen SOVALDI 400 MG TABS Take 400 mg by mouth every evening.   . [DISCONTINUED] esomeprazole (NEXIUM) 40 MG capsule Take 40 mg by mouth daily at 12 noon.  . [DISCONTINUED] naproxen (NAPROSYN) 500 MG tablet Take 1 tablet (500 mg total) by mouth 2 (two) times daily with a meal.  . DAKLINZA 60 MG TABS Take 60 mg by mouth every evening.      Review of Systems  Constitutional: Positive for fatigue.  HENT: Negative.   Respiratory: Positive for shortness of breath.   Cardiovascular: Positive for leg swelling.  Gastrointestinal:  Positive for abdominal pain and abdominal distention.  Genitourinary: Negative.   Neurological: Positive for tremors.  Psychiatric/Behavioral: Positive for confusion.       Objective:   Physical Exam  HENT:  Head: Normocephalic.  Cardiovascular: Normal rate and regular rhythm.   Pulmonary/Chest: Effort normal and breath sounds normal.  Abdominal: He exhibits distension.  Psychiatric: He has a normal mood and affect. His behavior is normal. Judgment and thought content normal.    BP 151/79 mmHg  Pulse 88  Temp(Src) 98.2 F (36.8 C) (Oral)  Ht 5\' 6"   (1.676 m)  Wt 293 lb (132.904 kg)  BMI 47.31 kg/m2       Assessment & Plan:  1. Other cirrhosis of liver Continue with treatment for hep C and follow-up with hepatologist and gastroenterologist. Pain contract signed and oxycodone refilled with I think understanding inherent in that contract  2. Type 2 diabetes mellitus without complication  - POCT glycosylated hemoglobin (Hb A1C)  3. Chronic hepatitis C with cirrhosis  - HIV antibody (with reflex)  4. Screening for HIV (human immunodeficiency virus)

## 2014-10-11 ENCOUNTER — Encounter: Payer: Self-pay | Admitting: General Practice

## 2014-10-11 ENCOUNTER — Telehealth: Payer: Self-pay | Admitting: General Practice

## 2014-10-11 ENCOUNTER — Telehealth: Payer: Self-pay | Admitting: *Deleted

## 2014-10-11 LAB — HIV ANTIBODY (ROUTINE TESTING W REFLEX): HIV Screen 4th Generation wRfx: NONREACTIVE

## 2014-10-11 NOTE — Telephone Encounter (Signed)
Pt notified of results Verbalizes understanding 

## 2014-10-11 NOTE — Telephone Encounter (Signed)
-----   Message from [name], MD sent at 10/10/2014  5:06 PM EST ----- Hemoglobin A1c is good. Continue with same medication

## 2014-10-11 NOTE — Telephone Encounter (Signed)
I called Mr. Hehir to let him know we needed to reschedule his appt on 3/9th from 3 to 12 noon. Patient was unavailable so I left a message with his stepson, to return my call.  I will also mail a letter

## 2014-10-11 NOTE — Telephone Encounter (Signed)
Correction: appt is on 3/10 not 3/9th

## 2014-10-12 ENCOUNTER — Telehealth: Payer: Self-pay | Admitting: Gastroenterology

## 2014-10-12 NOTE — Telephone Encounter (Signed)
REVIEWED-NO ADDITIONAL RECOMMENDATIONS. 

## 2014-10-12 NOTE — Telephone Encounter (Signed)
Pt has OV on 3/10 with SF. Pt's wife said his liver is swelling and is out of Xiafaxin. JL has faxed the paperwork to [facility] and is waiting to hear back. Patient is needing samples to hold him until he can get prescription. Please advise and call Mrs Arakawa back at [phone]

## 2014-10-12 NOTE — Telephone Encounter (Signed)
Routing to Dr. Fields.  

## 2014-10-12 NOTE — Telephone Encounter (Signed)
Xifaxin 550 mg # 12 given to take one bid. At front for pick up and wife is aware.

## 2014-10-16 ENCOUNTER — Other Ambulatory Visit: Payer: Self-pay | Admitting: *Deleted

## 2014-10-16 MED ORDER — GLIPIZIDE 10 MG PO TABS: 10.0000 mg | ORAL_TABLET | Freq: Every day | ORAL | Status: DC

## 2014-10-16 MED ORDER — OMEPRAZOLE 20 MG PO CPDR: 40.0000 mg | DELAYED_RELEASE_CAPSULE | Freq: Every day | ORAL | Status: DC

## 2014-10-16 NOTE — Telephone Encounter (Signed)
Omeprazole and glipizide refill requests authorized

## 2014-10-18 ENCOUNTER — Telehealth: Payer: Self-pay | Admitting: Gastroenterology

## 2014-10-18 NOTE — Telephone Encounter (Signed)
REVIEWED. OK TO WAIT UNTIL APR.

## 2014-10-18 NOTE — Telephone Encounter (Signed)
Pt's wife is aware that he should be getting the forms in the mail to complete.  She is also aware that we do not have samples at this time. She wanted to make sure that is is OK with [name] for pt to wait til 4/14 to see her, since he had to reschedule due to stomach bug.

## 2014-10-18 NOTE — Telephone Encounter (Signed)
LMOM ok to wait til April for OV.

## 2014-10-18 NOTE — Telephone Encounter (Signed)
Wife called back and is aware the [facility] said it was ok to wait until April

## 2014-10-18 NOTE — Telephone Encounter (Signed)
pts forms were sent to xifaxan. Pt was deemed under insured and they have mailed him patient assistance forms to him to  fill out. Pt should be able to get it for free if he completes the forms.

## 2014-10-18 NOTE — Telephone Encounter (Signed)
Pt's wife called to cancel OV with SF for tomorrow because he has a stomach virus and she is aware of OV to see SF on 4/14 at 230. She said that JL was working on papers for him to get his liver medicines and he is almost out of samples. Can we get some more for him? I believe she said it was the [place]. Please advise and call her at [phone]

## 2014-10-19 ENCOUNTER — Other Ambulatory Visit (HOSPITAL_COMMUNITY): Payer: Medicare Other

## 2014-10-19 ENCOUNTER — Ambulatory Visit: Payer: Medicare Other | Admitting: Gastroenterology

## 2014-10-19 ENCOUNTER — Ambulatory Visit (HOSPITAL_COMMUNITY): Payer: Medicare Other | Admitting: Oncology

## 2014-10-20 ENCOUNTER — Other Ambulatory Visit (HOSPITAL_COMMUNITY): Payer: Medicare Other

## 2014-10-20 ENCOUNTER — Ambulatory Visit (HOSPITAL_COMMUNITY): Payer: Medicare Other | Admitting: Oncology

## 2014-10-24 ENCOUNTER — Ambulatory Visit (HOSPITAL_COMMUNITY): Payer: Medicare Other | Admitting: Oncology

## 2014-10-24 ENCOUNTER — Other Ambulatory Visit (HOSPITAL_COMMUNITY): Payer: Medicare Other

## 2014-10-25 ENCOUNTER — Ambulatory Visit (HOSPITAL_COMMUNITY): Payer: Medicare Other | Admitting: Oncology

## 2014-10-26 ENCOUNTER — Encounter (HOSPITAL_BASED_OUTPATIENT_CLINIC_OR_DEPARTMENT_OTHER): Payer: Medicare Other | Admitting: Oncology

## 2014-10-26 ENCOUNTER — Encounter (HOSPITAL_COMMUNITY): Payer: Self-pay | Admitting: Oncology

## 2014-10-26 ENCOUNTER — Encounter (HOSPITAL_COMMUNITY): Payer: Medicare Other | Attending: Oncology

## 2014-10-26 VITALS — BP 157/60 | HR 85 | Temp 98.9°F | Resp 18 | Wt 298.0 lb

## 2014-10-26 DIAGNOSIS — K7460 Unspecified cirrhosis of liver: Secondary | ICD-10-CM

## 2014-10-26 DIAGNOSIS — S2020XA Contusion of thorax, unspecified, initial encounter: Secondary | ICD-10-CM | POA: Insufficient documentation

## 2014-10-26 DIAGNOSIS — D500 Iron deficiency anemia secondary to blood loss (chronic): Secondary | ICD-10-CM

## 2014-10-26 DIAGNOSIS — W1839XA Other fall on same level, initial encounter: Secondary | ICD-10-CM | POA: Insufficient documentation

## 2014-10-26 DIAGNOSIS — D61818 Other pancytopenia: Secondary | ICD-10-CM

## 2014-10-26 DIAGNOSIS — D509 Iron deficiency anemia, unspecified: Secondary | ICD-10-CM

## 2014-10-26 DIAGNOSIS — S20219A Contusion of unspecified front wall of thorax, initial encounter: Secondary | ICD-10-CM | POA: Insufficient documentation

## 2014-10-26 LAB — CBC WITH DIFFERENTIAL/PLATELET
Basophils Absolute: 0 10*3/uL (ref 0.0–0.1)
Basophils Relative: 0 % (ref 0–1)
EOS ABS: 0.1 10*3/uL (ref 0.0–0.7)
Eosinophils Relative: 3 % (ref 0–5)
HCT: 31.8 % — ABNORMAL LOW (ref 39.0–52.0)
Hemoglobin: 10.6 g/dL — ABNORMAL LOW (ref 13.0–17.0)
LYMPHS PCT: 21 % (ref 12–46)
Lymphs Abs: 0.8 10*3/uL (ref 0.7–4.0)
MCH: 30.8 pg (ref 26.0–34.0)
MCHC: 33.3 g/dL (ref 30.0–36.0)
MCV: 92.4 fL (ref 78.0–100.0)
MONOS PCT: 10 % (ref 3–12)
Monocytes Absolute: 0.4 10*3/uL (ref 0.1–1.0)
NEUTROS ABS: 2.5 10*3/uL (ref 1.7–7.7)
NEUTROS PCT: 66 % (ref 43–77)
Platelets: 60 10*3/uL — ABNORMAL LOW (ref 150–400)
RBC: 3.44 MIL/uL — AB (ref 4.22–5.81)
RDW: 13.8 % (ref 11.5–15.5)
WBC: 3.8 10*3/uL — ABNORMAL LOW (ref 4.0–10.5)

## 2014-10-26 NOTE — Assessment & Plan Note (Signed)
Secondary to chronic GI blood loss. Will check iron studies today, in 3 months, and 6 months.

## 2014-10-26 NOTE — Progress Notes (Signed)
[name], MD [place] [id number]  Other pancytopenia - Plan: CBC with Differential, Comprehensive metabolic panel, Iron and TIBC, Ferritin  Iron (Fe) deficiency anemia  Traumatic ecchymosis of chest, initial encounter  CURRENT THERAPY: Surveillance of blood counts and administration of IV iron as needed.  INTERVAL HISTORY: [name] 54 y.o. male returns for followup of pancytopenia secondary to cirrhosis of the liver with iron deficiency, status post IV Feraheme. His cirrhosis of non-alcohol-related and he has been evaluated in [place], [place] for possible transplant. There is a history of left nephrolithiasis as well as cholelithiasis. Due to his severe underlying liver disease, cholecystectomy has not been done.   I personally reviewed and went over laboratory results with the patient.  The results are noted within this dictation.  Labs performed today are pending at this time.    He complains of night time muscle cramping.  I have recommended tonic water with quinine to help with this anecdotally.  This is added to the patient's discharge instructions.   He reports that he tripped and fell.  He landed on a brick on his right chest wall, superior to breast.  Exam is impressive for an ecchymosis and swelling.  No paradoxical movement of chest wall.  I have offered an Xray to evaluate for Fx, but he has declined at this time because he notes it is improving symptomatically.  I have encouraged follow-up with his primary care provider if worsens in the future.    Hematologically, he denies any complaints and ROS questioning is negative.   Past Medical History  Diagnosis Date  . Hypertension   . Diabetes mellitus   . GERD (gastroesophageal reflux disease)     DEC 2010 EGD/Bx REACTIVE GASTROPATHY, NO VARICES  . Hemorrhoids, internal   . BMI 40.0-44.9, adult OCT 2010 269 LBS    APR 2012 279 LBS AUG 2014 185 LBS  . Cirrhosis NOV 2010 CHILD  PUGH A    ETOH/HCV/OBESITY  . IV drug abuse REMOTE  . Hepatitis 2010 HEP C    AST 509 ALT 267 ALK PHOS 165 ALB 3.8 NEG IGM HAV/HBSAg  . Gallstone AUG 2012 1 CM  . GERD 10/25/2009  . COPD (chronic obstructive pulmonary disease)   . Hepatitis C   . Pancytopenia 2013  . Splenomegaly 2013  . Other pancytopenia 02/18/2013  . Iron (Fe) deficiency anemia 02/18/2013  . Splenomegaly 02/18/2013  . Neuropathy   . Biliary dyskinesia JUL 2015    HIDA GB EF 5%  . Chronic pain     has Chronic hepatitis C virus infection; DM; ALCOHOL ABUSE; GERD; Hepatic cirrhosis; UNSPECIFIED DISEASE OF PANCREAS; Abdominal pain; Colon cancer screening; Other pancytopenia; Iron (Fe) deficiency anemia; Hematochezia; Angiodysplasia of colon; Sepsis; Encephalopathy, hepatic; Mental status change; Altered mental status; Diabetes; Right upper quadrant abdominal pain; Chronic hepatitis C with cirrhosis; and Traumatic ecchymosis of chest on his problem list.     is allergic to penicillins.  [name] does not currently have medications on file.  Past Surgical History  Procedure Laterality Date  . Sigmoidoscopy      2001 DR. FLEISCHMAN INTERNAL HERMORRHOIDS  . Upper gastrointestinal endoscopy  DEC 2010    BENIGN POLYPS, GASTRITIS, ?phg  . Knee surgery  RIGHT  . Hemorrhoid surgery    . Esophageal biopsy  09/08/2011    [name]:Moderate gastritis/Polyps, multiple in the body of the stomach  . Colonoscopy with propofol N/A  11/15/2013    Dr. Fields: rectal varices, small AVMs  . Esophagogastroduodenoscopy (egd) with propofol N/A 11/15/2013    Dr. 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 Next EGD in April 2016.      Denies any headaches, dizziness, double vision, fevers, chills, night sweats, nausea, vomiting, diarrhea, constipation, chest pain, heart palpitations, shortness of breath, blood in stool, black tarry stool, urinary pain, urinary burning, urinary  frequency, hematuria.   PHYSICAL EXAMINATION  ECOG PERFORMANCE STATUS: 0 - Asymptomatic  Filed Vitals:   10/26/14 1519  BP: 157/60  Pulse: 85  Temp: 98.9 F (37.2 C)  Resp: 18    GENERAL:alert, no distress, well nourished, well developed, comfortable, cooperative, obese and smiling SKIN: skin color, texture, turgor are normal, no rashes or significant lesions HEAD: Normocephalic, No masses, lesions, tenderness or abnormalities EYES: normal, PERRLA, EOMI, Conjunctiva are pink and non-injected EARS: External ears normal OROPHARYNX:lips, buccal mucosa, and tongue normal and mucous membranes are moist  NECK: supple, no adenopathy, trachea midline LYMPH:  no palpable lymphadenopathy BREAST:right chest wall ecchymosis with swelling superior to right breast and lateral to sternum.  No paradoxical chest movement. LUNGS: clear to auscultation and percussion HEART: regular rate & rhythm, no gallops, S1 normal, S2 normal and 1/6 systolic ejection murmur heard best at LSB. ABDOMEN:abdomen soft, non-tender and obese BACK: Back symmetric, no curvature. EXTREMITIES:less then 2 second capillary refill, no joint deformities, effusion, or inflammation, no skin discoloration, no cyanosis  NEURO: alert & oriented x 3 with fluent speech, no focal motor/sensory deficits, gait normal   LABORATORY DATA: CBC    Component Value Date/Time   WBC 4.6 08/02/2014 0559   RBC 3.25* 08/02/2014 0559   HGB 10.4* 08/02/2014 0559   HCT 31.3* 08/02/2014 0559   PLT 51* 08/02/2014 0559   MCV 96.3 08/02/2014 0559   MCH 32.0 08/02/2014 0559   MCHC 33.2 08/02/2014 0559   RDW 14.1 08/02/2014 0559   LYMPHSABS 0.8 07/31/2014 1220   MONOABS 0.4 07/31/2014 1220   EOSABS 0.0 07/31/2014 1220   BASOSABS 0.0 07/31/2014 1220      Chemistry      Component Value Date/Time   NA 142 07/31/2014 1220   NA 135 03/01/2014 1413   K 4.6 07/31/2014 1220   CL 108 07/31/2014 1220   CO2 23 07/31/2014 1220   BUN 18 07/31/2014  1220   BUN 11 03/01/2014 1413   CREATININE 0.74 07/31/2014 1220   CREATININE 0.67 07/19/2012 0823      Component Value Date/Time   CALCIUM 8.7 07/31/2014 1220   ALKPHOS 105 08/14/2014 0846   AST 42* 08/14/2014 0846   ALT 23 08/14/2014 0846   BILITOT 1.9* 08/14/2014 0846     Lab Results  Component Value Date   IRON 80 06/29/2013   TIBC 305 06/29/2013   FERRITIN 183 06/27/2014     ASSESSMENT AND PLAN:  Other pancytopenia Pancytopenia secondary to cirrhosis of the liver with iron deficiency, status post IV Feraheme. His cirrhosis is of non-alcohol-related and he has been evaluated in [place],  for possible transplant.  Labs today: CBC diff, CMET and repeated in 3 months and 6 months.  He notes muscle cramping which is unrelated to pancytopenia.  I have recommended Tonic Water with Quinine to help with this issue. Return in 6 months for follow-up.  He has cancelled and rescheduled the following appointments with us: 10/25/2014 10/24/2014 10/20/2014 10/19/2014 09/20/2014 09/12/2014 08/29/2014   Iron (  Fe) deficiency anemia Secondary to chronic GI blood loss. Will check iron studies today, in 3 months, and 6 months.    Traumatic ecchymosis of chest Secondary to recent fall landing on a brick.  It is swollen and ecchymotic.  He declined xray due to cost.  He will follow-up with primary care if not improved over the next few weeks.    THERAPY PLAN:  We will continue to monitor labs and provide iron as indicated.  All questions were answered. The patient knows to call the clinic with any problems, questions or concerns. We can certainly see the patient much sooner if necessary.  Patient and plan discussed with Dr. Shannon Penland and she is in agreement with the aforementioned.   This note is electronically signed by: KEFALAS,THOMAS 10/26/2014 3:45 PM   

## 2014-10-26 NOTE — Patient Instructions (Signed)
[place] at [facility] Discharge Instructions  RECOMMENDATIONS MADE BY THE CONSULTANT AND ANY TEST RESULTS WILL BE SENT TO YOUR REFERRING PHYSICIAN.  Pick up tonic water with quinine (over the counter) at your pharmacy. Return as scheduled in 3 and 6 months for lab work. Return in 6 months for office visit with Dr. Whitney Muse.  Call the clinic should you have an questions or concerns prior to your appointment.  Thank you for choosing [place] at [facility] to provide your oncology and hematology care.  To afford each patient quality time with our provider, please arrive at least 15 minutes before your scheduled appointment time.    You need to re-schedule your appointment should you arrive 10 or more minutes late.  We strive to give you quality time with our providers, and arriving late affects you and other patients whose appointments are after yours.  Also, if you no show three or more times for appointments you may be dismissed from the clinic at the providers discretion.     Again, thank you for choosing [facility].  Our hope is that these requests will decrease the amount of time that you wait before being seen by our physicians.       _____________________________________________________________  Should you have questions after your visit to [facility], please contact our office at [phone] between the hours of 8:30 a.m. and 4:30 p.m.  Voicemails left after 4:30 p.m. will not be returned until the following business day.  For prescription refill requests, have your pharmacy contact our office.

## 2014-10-26 NOTE — Assessment & Plan Note (Addendum)
Pancytopenia secondary to cirrhosis of the liver with iron deficiency, status post IV Feraheme. His cirrhosis is of non-alcohol-related and he has been evaluated in [place], [place] for possible transplant.  Labs today: CBC diff, CMET and repeated in 3 months and 6 months.  He notes muscle cramping which is unrelated to pancytopenia.  I have recommended Tonic Water with Quinine to help with this issue. Return in 6 months for follow-up.  He has cancelled and rescheduled the following appointments with [place]: 10/25/2014 10/24/2014 10/20/2014 10/19/2014 09/20/2014 09/12/2014 08/29/2014

## 2014-10-26 NOTE — Assessment & Plan Note (Signed)
Secondary to recent fall landing on a brick.  It is swollen and ecchymotic.  He declined xray due to cost.  He will follow-up with primary care if not improved over the next few weeks.

## 2014-10-26 NOTE — Progress Notes (Signed)
[name] presented for [employer]. Labs per MD order drawn via Peripheral Line 23 gauge needle inserted in right hand.  Good blood return present. Procedure without incident.  Needle removed intact. Patient tolerated procedure well.

## 2014-10-27 ENCOUNTER — Other Ambulatory Visit (HOSPITAL_COMMUNITY): Payer: Self-pay | Admitting: Oncology

## 2014-10-27 DIAGNOSIS — D509 Iron deficiency anemia, unspecified: Secondary | ICD-10-CM

## 2014-10-27 LAB — IRON AND TIBC
Iron: 37 ug/dL — ABNORMAL LOW (ref 42–165)
SATURATION RATIOS: 12 % — AB (ref 20–55)
TIBC: 312 ug/dL (ref 215–435)
UIBC: 275 ug/dL (ref 125–400)

## 2014-10-27 LAB — FERRITIN: FERRITIN: 34 ng/mL (ref 22–322)

## 2014-11-01 ENCOUNTER — Ambulatory Visit (HOSPITAL_COMMUNITY): Payer: Medicare Other

## 2014-11-02 ENCOUNTER — Telehealth: Payer: Self-pay | Admitting: Family Medicine

## 2014-11-02 MED ORDER — OXYCODONE HCL 10 MG PO TABS: ORAL_TABLET | ORAL | Status: DC

## 2014-11-02 NOTE — Telephone Encounter (Signed)
Patient is requesting a refill on oxycodone. Patient states he is out and he last got filled on 3/1.

## 2014-11-02 NOTE — Telephone Encounter (Signed)
Detailed message left that rx ready to be picked up.  

## 2014-11-08 ENCOUNTER — Ambulatory Visit (HOSPITAL_COMMUNITY): Payer: Medicare Other

## 2014-11-09 ENCOUNTER — Encounter (HOSPITAL_BASED_OUTPATIENT_CLINIC_OR_DEPARTMENT_OTHER): Payer: Medicare Other

## 2014-11-09 ENCOUNTER — Encounter (HOSPITAL_COMMUNITY): Payer: Self-pay

## 2014-11-09 VITALS — BP 128/55 | HR 77 | Temp 98.0°F | Resp 18

## 2014-11-09 DIAGNOSIS — D500 Iron deficiency anemia secondary to blood loss (chronic): Secondary | ICD-10-CM | POA: Diagnosis present

## 2014-11-09 DIAGNOSIS — K922 Gastrointestinal hemorrhage, unspecified: Secondary | ICD-10-CM

## 2014-11-09 DIAGNOSIS — D509 Iron deficiency anemia, unspecified: Secondary | ICD-10-CM

## 2014-11-09 DIAGNOSIS — Z0289 Encounter for other administrative examinations: Secondary | ICD-10-CM

## 2014-11-09 MED ORDER — SODIUM CHLORIDE 0.9 % IV SOLN
510.0000 mg | Freq: Once | INTRAVENOUS | Status: AC
  Administered 2014-11-09: 510 mg via INTRAVENOUS
  Filled 2014-11-09: qty 17

## 2014-11-09 MED ORDER — SODIUM CHLORIDE 0.9 % IV SOLN
Freq: Once | INTRAVENOUS | Status: AC
  Administered 2014-11-09: 15:00:00 via INTRAVENOUS

## 2014-11-09 NOTE — Progress Notes (Signed)
Tolerated iron infusion well. 

## 2014-11-15 ENCOUNTER — Encounter (HOSPITAL_COMMUNITY): Payer: Self-pay | Admitting: Emergency Medicine

## 2014-11-15 ENCOUNTER — Emergency Department (HOSPITAL_COMMUNITY)
Admission: EM | Admit: 2014-11-15 | Discharge: 2014-11-15 | Disposition: A | Discharge status: Home / Self Care | Payer: Medicare Other | Attending: Emergency Medicine | Admitting: Emergency Medicine

## 2014-11-15 ENCOUNTER — Emergency Department (HOSPITAL_COMMUNITY): Payer: Medicare Other

## 2014-11-15 ENCOUNTER — Ambulatory Visit (HOSPITAL_COMMUNITY): Payer: Medicare Other

## 2014-11-15 DIAGNOSIS — Z862 Personal history of diseases of the blood and blood-forming organs and certain disorders involving the immune mechanism: Secondary | ICD-10-CM | POA: Insufficient documentation

## 2014-11-15 DIAGNOSIS — I10 Essential (primary) hypertension: Secondary | ICD-10-CM | POA: Diagnosis not present

## 2014-11-15 DIAGNOSIS — R609 Edema, unspecified: Secondary | ICD-10-CM | POA: Insufficient documentation

## 2014-11-15 DIAGNOSIS — K219 Gastro-esophageal reflux disease without esophagitis: Secondary | ICD-10-CM | POA: Insufficient documentation

## 2014-11-15 DIAGNOSIS — Z79899 Other long term (current) drug therapy: Secondary | ICD-10-CM | POA: Insufficient documentation

## 2014-11-15 DIAGNOSIS — G8929 Other chronic pain: Secondary | ICD-10-CM | POA: Diagnosis not present

## 2014-11-15 DIAGNOSIS — Z8619 Personal history of other infectious and parasitic diseases: Secondary | ICD-10-CM | POA: Insufficient documentation

## 2014-11-15 DIAGNOSIS — Z794 Long term (current) use of insulin: Secondary | ICD-10-CM | POA: Insufficient documentation

## 2014-11-15 DIAGNOSIS — J449 Chronic obstructive pulmonary disease, unspecified: Secondary | ICD-10-CM | POA: Insufficient documentation

## 2014-11-15 DIAGNOSIS — Z880 Allergy status to penicillin: Secondary | ICD-10-CM | POA: Insufficient documentation

## 2014-11-15 DIAGNOSIS — M79604 Pain in right leg: Secondary | ICD-10-CM | POA: Diagnosis present

## 2014-11-15 DIAGNOSIS — E119 Type 2 diabetes mellitus without complications: Secondary | ICD-10-CM | POA: Diagnosis not present

## 2014-11-15 DIAGNOSIS — L03115 Cellulitis of right lower limb: Secondary | ICD-10-CM | POA: Insufficient documentation

## 2014-11-15 HISTORY — DX: Pain in leg, unspecified: M79.606

## 2014-11-15 HISTORY — DX: Other chronic pain: G89.29

## 2014-11-15 LAB — COMPREHENSIVE METABOLIC PANEL
ALBUMIN: 2.9 g/dL — AB (ref 3.5–5.2)
ALK PHOS: 119 U/L — AB (ref 39–117)
ALT: 29 U/L (ref 0–53)
AST: 41 U/L — AB (ref 0–37)
Anion gap: 4 — ABNORMAL LOW (ref 5–15)
BUN: 14 mg/dL (ref 6–23)
CALCIUM: 8.4 mg/dL (ref 8.4–10.5)
CO2: 26 mmol/L (ref 19–32)
Chloride: 107 mmol/L (ref 96–112)
Creatinine, Ser: 0.71 mg/dL (ref 0.50–1.35)
GFR calc Af Amer: >90 mL/min (ref >90)
GFR calc non Af Amer: >90 mL/min (ref >90)
GLUCOSE: 102 mg/dL — AB (ref 70–99)
POTASSIUM: 4.2 mmol/L (ref 3.5–5.1)
SODIUM: 137 mmol/L (ref 135–145)
TOTAL PROTEIN: 6.3 g/dL (ref 6.0–8.3)
Total Bilirubin: 1.1 mg/dL (ref 0.3–1.2)

## 2014-11-15 LAB — CBC WITH DIFFERENTIAL/PLATELET
BASOS PCT: 0 % (ref 0–1)
Basophils Absolute: 0 10*3/uL (ref 0.0–0.1)
Eosinophils Absolute: 0.1 10*3/uL (ref 0.0–0.7)
Eosinophils Relative: 3 % (ref 0–5)
HCT: 34.5 % — ABNORMAL LOW (ref 39.0–52.0)
Hemoglobin: 11.5 g/dL — ABNORMAL LOW (ref 13.0–17.0)
LYMPHS PCT: 27 % (ref 12–46)
Lymphs Abs: 1.1 10*3/uL (ref 0.7–4.0)
MCH: 30.8 pg (ref 26.0–34.0)
MCHC: 33.3 g/dL (ref 30.0–36.0)
MCV: 92.5 fL (ref 78.0–100.0)
Monocytes Absolute: 0.5 10*3/uL (ref 0.1–1.0)
Monocytes Relative: 12 % (ref 3–12)
Neutro Abs: 2.5 10*3/uL (ref 1.7–7.7)
Neutrophils Relative %: 58 % (ref 43–77)
PLATELETS: 64 10*3/uL — AB (ref 150–400)
RBC: 3.73 MIL/uL — ABNORMAL LOW (ref 4.22–5.81)
RDW: 14.7 % (ref 11.5–15.5)
WBC: 4.2 10*3/uL (ref 4.0–10.5)

## 2014-11-15 MED ORDER — OXYCODONE HCL 5 MG PO TABS
10.0000 mg | ORAL_TABLET | Freq: Once | ORAL | Status: AC
  Administered 2014-11-15: 10 mg via ORAL
  Filled 2014-11-15: qty 2

## 2014-11-15 MED ORDER — DOXYCYCLINE HYCLATE 100 MG PO CAPS: 100.0000 mg | ORAL_CAPSULE | Freq: Two times a day (BID) | ORAL | Status: DC

## 2014-11-15 NOTE — Discharge Instructions (Signed)

## 2014-11-15 NOTE — ED Notes (Signed)
Pt reports redness, edema and pain in his R lower leg since Sun.

## 2014-11-15 NOTE — ED Notes (Signed)
Patient given discharge instruction, verbalized understand. Patient wheelchair out of the department.  

## 2014-11-15 NOTE — ED Provider Notes (Signed)
CSN: [id number]     Arrival date & time 11/15/14  1216 History   First MD Initiated Contact with Patient 11/15/14 1302     Chief Complaint  Patient presents with  . Leg Pain     (Consider location/radiation/quality/duration/timing/severity/associated sxs/prior Treatment) Patient is a 54 y.o. male presenting with leg pain. The history is provided by the patient.  Leg Pain Associated symptoms: fever   Associated symptoms: no back pain    patient with redness and pain to his right lower leg. Began around 5 days ago. Worse with walking. Some erythema and tenderness. States is like it's more swollen. Has had previous cellulitis on his left lower leg. States the veins are more prominent in his right leg now. States he had some fevers on _0 01 DR. FLEISCHMAN INTERNAL HERMORRHOIDS  . Upper gastrointestinal endoscopy  DEC 2010    BENIGN POLYPS, GASTRITIS, ?phg  . Knee surgery  RIGHT  . Hemorrhoid surgery    . Esophageal biopsy  09/08/2011    [name]:Moderate gastritis/Polyps, multiple in the body of the stomach  . Colonoscopy with propofol N/A 11/15/2013    [name]: rectal varices, small AVMs  . Esophagogastroduodenoscopy (egd) with propofol N/A 11/15/2013    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 Next EGD in April 2016.     Family History  Problem Relation Age of Onset  . Colon cancer Neg Hx   . Anesthesia problems Neg Hx   . Hypotension Neg Hx   . Malignant hyperthermia Neg Hx   . Pseudochol deficiency Neg Hx   . Cancer Father    History  Substance Use Topics  . Smoking status: Never Smoker   . Smokeless tobacco: Never Used  . Alcohol Use: No     Comment: 30 years ago    Review of Systems  Constitutional: Positive for fever, chills and appetite change.  Respiratory: Negative for shortness of breath.   Cardiovascular: Positive for leg swelling. Negative for chest pain.  Gastrointestinal: Negative for abdominal pain.  Musculoskeletal: Negative for back pain.  Skin: Positive for color change. Negative for wound.      Allergies  Penicillins  Home Medications   Prior to Admission medications   Medication Sig Start  Date End Date Taking? Authorizing Provider  acetaminophen (TYLENOL) 500 MG tablet Take 1,000 mg by mouth every 6 (six) hours as needed for mild pain or headache.    Yes Historical Provider, MD  albuterol (PROVENTIL HFA;VENTOLIN HFA) 108 (90 BASE) MCG/ACT inhaler Inhale 2 puffs into the lungs every 6 (six) hours as needed. Shortness of breath 08/14/14  Yes [name], MD  furosemide (LASIX) 20 MG tablet Take 1 tablet (20 mg total) by mouth daily. 09/26/14  Yes [name], MD  glipiZIDE (GLUCOTROL) 10 MG tablet Take 1 tablet (10 mg total) by mouth daily. 10/16/14  Yes [name], MD  hydrOXYzine (ATARAX/VISTARIL) 10 MG tablet Take 1 tablet (10 mg total) by mouth every 4 (four) hours as needed for itching. 08/14/14  Yes  [name], MD  insulin glargine (LANTUS) 100 UNIT/ML injection Inject 10 Units into the skin at bedtime.   Yes Historical Provider, MD  lactulose (CHRONULAC) 10 GM/15ML solution Take 15 mLs (10 g total) by mouth 3 (three) times daily. 10/10/14  Yes [name], MD  omeprazole (PRILOSEC) 20 MG capsule Take 2 capsules (40 mg total) by mouth daily. 10/16/14  Yes [name], MD  Oxycodone HCl 10 MG TABS 1 tablet every 6 hours PRN 11/02/14  Yes [name], MD  rifaximin (XIFAXAN) 550 MG TABS tablet Take 1 tablet (550 mg total) by mouth 2 (two) times daily. 08/02/14  Yes Mahala Menghini, PA-C  doxycycline (VIBRAMYCIN) 100 MG capsule Take 1 capsule (100 mg total) by mouth 2 (two) times daily. 11/15/14   Davonna Belling, MD   BP 175/62 mmHg  Pulse 77  Temp(Src) 97.8 F (36.6 C) (Oral)  Resp 18  Ht _0  (1.702 m)  Wt 290 lb (131.543 kg)  BMI 45.41 kg/m2  SpO2 100% Physical Exam  Constitutional: He appears well-developed and well-nourished.  HENT:  Head: Atraumatic.  Cardiovascular: Normal rate and regular rhythm.   Pulmonary/Chest: Effort normal.  Abdominal: Soft. There is no tenderness.  Musculoskeletal: He exhibits edema and tenderness.  Some bilateral lower extremity pitting edema, worse on the right side. Erythema on right lower leg anteriorly. Mild induration. Few petechiae also. Dorsalis pedis pulse intact on right leg. Good capillary refill distally.  Psychiatric: He has a normal mood and affect.    ED Course  Procedures (including critical care time) Labs Review Labs Reviewed  CBC WITH DIFFERENTIAL/PLATELET - Abnormal; Notable for the following:    RBC 3.73 (*)    Hemoglobin 11.5 (*)    HCT 34.5 (*)    Platelets 64 (*)    All other components within normal limits  COMPREHENSIVE METABOLIC PANEL - Abnormal; Notable for the following:    Glucose, Bld 102 (*)    Albumin 2.9 (*)    AST 41 (*)    Alkaline Phosphatase 119 (*)    Anion gap 4 (*)    All other  components within normal limits    Imaging Review US Venous Img Lower Unilateral Right  11/15/2014   CLINICAL DATA:  Right leg pain for 4 day  EXAM: RIGHT LOWER EXTREMITY VENOUS DUPLEX ULTRASOUND  TECHNIQUE: Doppler venous assessment of the right lower extremity deep venous system was performed, including characterization of spectral flow, compressibility, and phasicity.  COMPARISON:  None.  FINDINGS: There is complete compressibility of the right common femoral, femoral, and popliteal veins. No obvious superficial vein thrombosis. No evidence of calf vein thrombosis. Subcutaneous edema in the distal leg  is noted.  IMPRESSION: No evidence of right lower extremity DVT.   Electronically Signed   By: Marybelle Killings M.D.   On: 11/15/2014 14:55     EKG Interpretation None      MDM   Final diagnoses:  Cellulitis of right lower extremity    Patient with cellulitis of his right lower leg. Negative Doppler and doubt arterial cause. White count reassuring. Will treat with antibiotics. Patient is requesting pain medicines, however he had a month supply of his chronic pain medicines filled 3 weeks ago. He states he is out of them. I will not refill his medication.    Davonna Belling, MD 11/15/14 [phone]

## 2014-11-16 ENCOUNTER — Telehealth: Payer: Self-pay | Admitting: Family Medicine

## 2014-11-16 ENCOUNTER — Encounter (HOSPITAL_COMMUNITY): Payer: Medicare Other | Attending: Oncology

## 2014-11-16 VITALS — BP 147/63 | HR 72 | Temp 98.2°F | Resp 20

## 2014-11-16 DIAGNOSIS — D509 Iron deficiency anemia, unspecified: Secondary | ICD-10-CM | POA: Insufficient documentation

## 2014-11-16 DIAGNOSIS — D61818 Other pancytopenia: Secondary | ICD-10-CM | POA: Insufficient documentation

## 2014-11-16 DIAGNOSIS — D500 Iron deficiency anemia secondary to blood loss (chronic): Secondary | ICD-10-CM

## 2014-11-16 DIAGNOSIS — S20219A Contusion of unspecified front wall of thorax, initial encounter: Secondary | ICD-10-CM | POA: Insufficient documentation

## 2014-11-16 MED ORDER — SODIUM CHLORIDE 0.9 % IV SOLN
INTRAVENOUS | Status: DC
  Administered 2014-11-16: 14:00:00 via INTRAVENOUS

## 2014-11-16 MED ORDER — SODIUM CHLORIDE 0.9 % IV SOLN
510.0000 mg | Freq: Once | INTRAVENOUS | Status: AC
  Administered 2014-11-16: 510 mg via INTRAVENOUS
  Filled 2014-11-16: qty 17

## 2014-11-16 MED ORDER — SODIUM CHLORIDE 0.9 % IJ SOLN
10.0000 mL | Freq: Once | INTRAMUSCULAR | Status: AC
  Administered 2014-11-16: 10 mL via INTRAVENOUS

## 2014-11-16 NOTE — Progress Notes (Signed)
Tolerated iron infusion well. 

## 2014-11-16 NOTE — Telephone Encounter (Signed)
Called pt and- spoke with wife. He is not out of med, but afraid that he may run out . ER told him to use tylenol for breakthrough pain - this was reiterated.  Pt aware that Dr Sabra Heck will return to office next Wednesday and that the med should last as close to due date as possible.

## 2014-11-16 NOTE — Patient Instructions (Signed)
[facility] Discharge Instructions  RECOMMENDATIONS MADE BY THE CONSULTANT AND ANY TEST RESULTS WILL BE SENT TO YOUR REFERRING PHYSICIAN.  Iron infusion today as ordered. Return as scheduled.  Thank you for choosing [facility] to provide your oncology and hematology care.  To afford each patient quality time with our provider, please arrive at least 15 minutes before your scheduled appointment time.    You need to re-schedule your appointment should you arrive 10 or more minutes late.  We strive to give you quality time with our providers, and arriving late affects you and other patients whose appointments are after yours.  Also, if you no show three or more times for appointments you may be dismissed from the clinic at the providers discretion.     Again, thank you for choosing [facility].  Our hope is that these requests will decrease the amount of time that you wait before being seen by our physicians.       _____________________________________________________________  Should you have questions after your visit to [facility], please contact our office at [phone] between the hours of 8:30 a.m. and 4:30 p.m.  Voicemails left after 4:30 p.m. will not be returned until the following business day.  For prescription refill requests, have your pharmacy contact our office.    

## 2014-11-21 ENCOUNTER — Other Ambulatory Visit: Payer: Self-pay | Admitting: *Deleted

## 2014-11-21 MED ORDER — FUROSEMIDE 20 MG PO TABS: 20.0000 mg | ORAL_TABLET | Freq: Every day | ORAL | Status: DC

## 2014-11-22 ENCOUNTER — Telehealth: Payer: Self-pay | Admitting: Family Medicine

## 2014-11-22 NOTE — Telephone Encounter (Signed)
Does this mean he is taking more than I prescribed?  How many more/

## 2014-11-22 NOTE — Telephone Encounter (Signed)
Please advise on rx and route to Pool A

## 2014-11-22 NOTE — Telephone Encounter (Signed)
Patient states that he was originally taking 2 QD and now since his leg pain he is taking up to 4 or 4 1/2. Please advise and route to [facility] A

## 2014-11-23 ENCOUNTER — Ambulatory Visit: Payer: Medicare Other | Admitting: Gastroenterology

## 2014-11-23 ENCOUNTER — Telehealth: Payer: Self-pay | Admitting: Family Medicine

## 2014-11-23 ENCOUNTER — Telehealth: Payer: Self-pay | Admitting: Gastroenterology

## 2014-11-23 MED ORDER — OXYCODONE HCL 10 MG PO TABS: ORAL_TABLET | ORAL | Status: DC

## 2014-11-23 NOTE — Telephone Encounter (Signed)
Pt's wife called today to cancel patient's OV with SF for today. She feel and needs to go have xrays done and patient is rescheduled to see SF on 5/12 at 1030. Wife said that she hasn't heard back from the paperwork she sent off regarding patient's xifaxin prescription. She said it's too expensive for them to get and asked if patient could get samples of it. Please advise and she asked for nurse to call her back sometime today. [phone]

## 2014-11-23 NOTE — Telephone Encounter (Signed)
I called and spoke to pt's wife. She said she filled out paperwork and brought it here several weeks ago for our office to fax to the company. She has not heard a thing and she does not have a phone number to call. Will forward to [place] to follow up on.   She is aware that we do not have any samples.

## 2014-11-23 NOTE — Telephone Encounter (Signed)
I have not received any paperwork from this pt. I have looked everywhere in the office and asked [facility] and the extenders if they have seen anything and all have told me no.

## 2014-11-27 NOTE — Telephone Encounter (Signed)
Sabra Heck - please advise

## 2014-11-28 NOTE — Telephone Encounter (Signed)
Okay to fill earlyt this time

## 2014-11-29 NOTE — Telephone Encounter (Signed)
Per TC with [facility], was filled on the 14th.

## 2014-12-05 ENCOUNTER — Encounter: Payer: Self-pay | Admitting: Family Medicine

## 2014-12-05 ENCOUNTER — Ambulatory Visit (INDEPENDENT_AMBULATORY_CARE_PROVIDER_SITE_OTHER): Payer: Medicare Other | Admitting: Family Medicine

## 2014-12-05 VITALS — BP 157/72 | HR 77 | Temp 98.9°F | Ht 67.0 in | Wt 297.0 lb

## 2014-12-05 DIAGNOSIS — B182 Chronic viral hepatitis C: Secondary | ICD-10-CM

## 2014-12-05 DIAGNOSIS — K7682 Hepatic encephalopathy: Secondary | ICD-10-CM

## 2014-12-05 DIAGNOSIS — K7290 Hepatic failure, unspecified without coma: Secondary | ICD-10-CM | POA: Diagnosis not present

## 2014-12-05 DIAGNOSIS — E119 Type 2 diabetes mellitus without complications: Secondary | ICD-10-CM | POA: Diagnosis not present

## 2014-12-05 MED ORDER — HYDROXYZINE HCL 10 MG PO TABS: 10.0000 mg | ORAL_TABLET | ORAL | Status: DC | PRN

## 2014-12-05 MED ORDER — SPIRONOLACTONE 25 MG PO TABS: ORAL_TABLET | ORAL | Status: DC

## 2014-12-05 NOTE — Progress Notes (Signed)
   Subjective:    Patient ID: [name], male    DOB: [birth year], 54 y.o.   MRN: [id number]  HPI Seen in ER for cellulitis and needed more analgesic.  Problem resolved.  Has gained wt; mostly fluid Chief Complaint  Patient presents with  . Pain    right abd pain due to Hep C. Patient isn't taking his "liver" medication.  Marland Kitchen Hospitalization Follow-up    Seen at [facility] ED on 11/15/14 for cellulitis to right lower leg. He completed round of doxycycline.    Patient Active Problem List   Diagnosis Date Noted  . Traumatic ecchymosis of chest 10/26/2014  . Right upper quadrant abdominal pain   . Chronic hepatitis C with cirrhosis   . Mental status change 07/31/2014  . Altered mental status 07/31/2014  . Diabetes 07/31/2014  . Sepsis 01/29/2014  . Encephalopathy, hepatic 01/29/2014  . Angiodysplasia of colon 12/27/2013  . Hematochezia 11/03/2013  . Other pancytopenia 02/18/2013  . Iron (Fe) deficiency anemia 02/18/2013  . Colon cancer screening 08/28/2011  . Abdominal pain 09/05/2010  . DM 02/15/2010  . GERD 10/25/2009  . Hepatic cirrhosis 10/25/2009  . Chronic hepatitis C virus infection 06/05/2009  . ALCOHOL ABUSE 06/05/2009  . UNSPECIFIED DISEASE OF PANCREAS 06/05/2009   Outpatient Encounter Prescriptions as of 12/05/2014  Medication Sig  . acetaminophen (TYLENOL) 500 MG tablet Take 1,000 mg by mouth every 6 (six) hours as needed for mild pain or headache.   . albuterol (PROVENTIL HFA;VENTOLIN HFA) 108 (90 BASE) MCG/ACT inhaler Inhale 2 puffs into the lungs every 6 (six) hours as needed. Shortness of breath  . furosemide (LASIX) 20 MG tablet Take 1 tablet (20 mg total) by mouth daily.  Marland Kitchen glipiZIDE (GLUCOTROL) 10 MG tablet Take 1 tablet (10 mg total) by mouth daily.  . hydrOXYzine (ATARAX/VISTARIL) 10 MG tablet Take 1 tablet (10 mg total) by mouth every 4 (four) hours as needed for itching.  . insulin glargine (LANTUS) 100 UNIT/ML injection Inject 10 Units into the skin at  bedtime.  Marland Kitchen lactulose (CHRONULAC) 10 GM/15ML solution Take 15 mLs (10 g total) by mouth 3 (three) times daily.  Marland Kitchen omeprazole (PRILOSEC) 20 MG capsule Take 2 capsules (40 mg total) by mouth daily.  . Oxycodone HCl 10 MG TABS 1 tablet every 6 hours PRN  . rifaximin (XIFAXAN) 550 MG TABS tablet Take 1 tablet (550 mg total) by mouth 2 (two) times daily.  . [DISCONTINUED] doxycycline (VIBRAMYCIN) 100 MG capsule Take 1 capsule (100 mg total) by mouth 2 (two) times daily.     Review of Systems  HENT: Negative.   Respiratory: Positive for shortness of breath.   Gastrointestinal: Positive for abdominal pain and abdominal distention.  Psychiatric/Behavioral: Negative.        Objective:   Physical Exam  Constitutional: He appears well-developed and well-nourished.  Cardiovascular: Normal rate and regular rhythm.   Pulmonary/Chest: Effort normal and breath sounds normal.  Abdominal:  Obese with fluid wave; tender over liver but does not percuss large    BP 157/72 mmHg  Pulse 77  Temp(Src) 98.9 F (37.2 C) (Oral)  Ht 5\' 7"  (1.702 m)  Wt 297 lb (134.718 kg)  BMI 46.51 kg/m2      Assessment & Plan:  1. Chronic hepatitis C without hepatic coma Still awaiting med per GI docs  2. Type 2 diabetes mellitus without complication Stable; V9D good  3. Encephalopathy, hepaticcontra Stable; continue lactulose; add spironalactone  [name] MD

## 2014-12-06 NOTE — Telephone Encounter (Signed)
Tried to call pt- NA 

## 2014-12-06 NOTE — Telephone Encounter (Signed)
Called pt assistance, they had set up a profile page on this pt but never mailed out the paperwork to him as the fax said they did. She is mailing out the paperwork today. Pt assistance phone number is [phone].

## 2014-12-07 NOTE — Telephone Encounter (Signed)
Tried to call- NA 

## 2014-12-08 NOTE — Telephone Encounter (Signed)
Mailed a letter and a copy of the xifaxan patient assistance forms to the pt.

## 2014-12-12 ENCOUNTER — Telehealth: Payer: Self-pay

## 2014-12-12 NOTE — Telephone Encounter (Signed)
Called pt and told him we have a few samples of Xifaxan 550 that he can have while he is waiting on pt assistance. Xifaxan 550 #12 to take one tablet bid. Pt will be by in day or so to pick up.

## 2014-12-13 NOTE — Telephone Encounter (Signed)
REVIEWED. AGREE. NO ADDITIONAL RECOMMENDATIONS. 

## 2014-12-14 ENCOUNTER — Telehealth: Payer: Self-pay | Admitting: Family Medicine

## 2014-12-14 NOTE — Telephone Encounter (Signed)
[name], you know this guy. [facility] pt. Not time but this happens every time. See what DWM will do?

## 2014-12-15 NOTE — Telephone Encounter (Signed)
Spoke with Mr Vitelli. Explained that if he is having to take his medication more than prescribed he needs to alert Dr Sabra Heck of this before he runs out. Dr Sabra Heck is the only one who will adjust the directions and the quantity. He is under a pain contract and needs to follow the prescribed directions.  Dr Sabra Heck will be back in the office on 5/10 and I will forward this request to him. Advised that 5/10 will still be earlier than it is due but I will let Dr Sabra Heck know he is requesting it.  Patient stated understanding.   Last prescribed on 4/14. He is to take 1 every 6 hours as needed with a quantity of 120. This should last one month.

## 2014-12-18 ENCOUNTER — Telehealth: Payer: Self-pay | Admitting: Family Medicine

## 2014-12-18 ENCOUNTER — Telehealth: Payer: Self-pay
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
## 2014-12-18 NOTE — Telephone Encounter (Signed)
I witnessed the call to [name].

## 2014-12-18 NOTE — Telephone Encounter (Signed)
I spoke to [place] and routing to her also.

## 2014-12-18 NOTE — Telephone Encounter (Signed)
I made [name]'s aware of the recommendations from [facility] and that he has an appt 5/12 at 10:30 am

## 2014-12-19 ENCOUNTER — Telehealth: Payer: Self-pay | Admitting: Family Medicine

## 2014-12-19 MED ORDER — OXYCODONE HCL 10 MG PO TABS: ORAL_TABLET | ORAL | Status: DC

## 2014-12-19 MED ORDER — RIFAXIMIN 550 MG PO TABS: 550.0000 mg | ORAL_TABLET | Freq: Two times a day (BID) | ORAL | Status: DC

## 2014-12-19 MED ORDER — LACTULOSE 10 GM/15ML PO SOLN: 10.0000 g | Freq: Three times a day (TID) | ORAL | Status: DC

## 2014-12-19 NOTE — Telephone Encounter (Signed)
Request for oxycodone and liver meds was completed

## 2014-12-20 ENCOUNTER — Telehealth: Payer: Self-pay | Admitting: Family Medicine

## 2014-12-20 NOTE — Telephone Encounter (Signed)
Script upfront for pickup. Patient aware.

## 2014-12-21 ENCOUNTER — Other Ambulatory Visit: Payer: Self-pay | Admitting: Gastroenterology

## 2014-12-21 ENCOUNTER — Ambulatory Visit (HOSPITAL_COMMUNITY)
Admission: RE | Admit: 2014-12-21 | Discharge: 2014-12-21 | Disposition: A | Discharge status: Home / Self Care | Payer: Medicare Other | Source: Ambulatory Visit | Attending: Gastroenterology | Admitting: Gastroenterology

## 2014-12-21 ENCOUNTER — Telehealth: Payer: Self-pay | Admitting: Gastroenterology

## 2014-12-21 ENCOUNTER — Ambulatory Visit (INDEPENDENT_AMBULATORY_CARE_PROVIDER_SITE_OTHER): Payer: Medicare Other | Admitting: Gastroenterology

## 2014-12-21 ENCOUNTER — Other Ambulatory Visit (HOSPITAL_COMMUNITY)
Admission: RE | Admit: 2014-12-21 | Discharge: 2014-12-21 | Disposition: A | Discharge status: Home / Self Care | Payer: Medicare Other | Source: Ambulatory Visit | Attending: Gastroenterology | Admitting: Gastroenterology

## 2014-12-21 ENCOUNTER — Encounter: Payer: Self-pay | Admitting: Gastroenterology

## 2014-12-21 VITALS — BP 162/67 | HR 76 | Temp 97.0°F | Ht 69.0 in | Wt 295.8 lb

## 2014-12-21 DIAGNOSIS — K7460 Unspecified cirrhosis of liver: Secondary | ICD-10-CM | POA: Insufficient documentation

## 2014-12-21 DIAGNOSIS — R161 Splenomegaly, not elsewhere classified: Secondary | ICD-10-CM | POA: Insufficient documentation

## 2014-12-21 DIAGNOSIS — K766 Portal hypertension: Secondary | ICD-10-CM | POA: Insufficient documentation

## 2014-12-21 DIAGNOSIS — K7682 Hepatic encephalopathy: Secondary | ICD-10-CM

## 2014-12-21 DIAGNOSIS — D509 Iron deficiency anemia, unspecified: Secondary | ICD-10-CM

## 2014-12-21 DIAGNOSIS — K7290 Hepatic failure, unspecified without coma: Secondary | ICD-10-CM

## 2014-12-21 DIAGNOSIS — R0602 Shortness of breath: Secondary | ICD-10-CM | POA: Diagnosis present

## 2014-12-21 DIAGNOSIS — B182 Chronic viral hepatitis C: Secondary | ICD-10-CM | POA: Diagnosis present

## 2014-12-21 DIAGNOSIS — R0781 Pleurodynia: Secondary | ICD-10-CM

## 2014-12-21 DIAGNOSIS — K219 Gastro-esophageal reflux disease without esophagitis: Secondary | ICD-10-CM

## 2014-12-21 MED ORDER — IOHEXOL 350 MG/ML SOLN
100.0000 mL | Freq: Once | INTRAVENOUS | Status: AC | PRN
  Administered 2014-12-21: 100 mL via INTRAVENOUS

## 2014-12-21 MED ORDER — CYCLOBENZAPRINE HCL 5 MG PO TABS: ORAL_TABLET | ORAL | Status: DC

## 2014-12-21 NOTE — Assessment & Plan Note (Signed)
SYMPTOMS CONTROLLED.  CONTINUE OMEPRAZOLE.  TAKE 30 MINUTES PRIOR TO YOUR FIRST MEAL. CONTINUE YOUR WEIGHT LOSS EFFORTS. LOW FAT/DIABETIC DIET FOLLOW UP IN 6 MOS.

## 2014-12-21 NOTE — Assessment & Plan Note (Signed)
SYMPTOMS CONTROLLED/RESOLVED ON LACTULOSE BID.  CONTINUE LACTULOSE. FOLLOW UP IN 6 MOS.

## 2014-12-21 NOTE — Progress Notes (Signed)
CONTACTED LIVER CLINIC. PT NEED HEP C QUANT, & AFP.

## 2014-12-21 NOTE — Assessment & Plan Note (Signed)
FOLLOWED BY HEMATOLOGY. RARE SML VOLUME HEMATOCHEZIA.  CONTINUE TO MONITOR SYMPTOMS. FOLLOW UP IN 6 MOS.

## 2014-12-21 NOTE — Progress Notes (Signed)
Subjective:    Patient ID: [name], male    DOB: 1961-03-29, 54 y.o.   MRN: [id number] Cory Kuster, MD   HPI PT MISSED LABS [facility] 2016. NEEDS LABS MAY 2016. APPT AT [facility] JUN 1. C/O BIL FLANK CRAMPS. STARTED IN RIBS LAST NIGHT. IT IS SHARP AND CRAMPY. WORSE WITH DEEP BREATHS. HAS CHRONIC INTERMITTENT LEGS, FEET, AND HANDS. LAST U/S RLE APR 2016: NEGATIVE.   MILD MID ABDOMINAL PAIN. $90 FOR XIFAXAN. CAN'T AFFORD. GETTING SAMPLES. NOT FEELING LIKE HURTING SELF OR ANYONE ELSE. WORRIED ABOUT WIFE. SITTING AROUND A RETAINING FLUID. BMS: 3-4/DAY WITH LACTULOSE BID. WHEN GETS CRAMPS CAN'T GET HIS BREATH. BEEN TRYING TO EXERCISE MORE.  PT DENIES FEVER, CHILLS, HEMATOCHEZIA, nausea, vomiting, melena, diarrhea, CHEST PAIN, SHORTNESS OF BREATH,  CHANGE IN BOWEL IN HABITS, constipation, problems swallowing, OR heartburn or indigestion.  Past Medical History  Diagnosis Date  . Hypertension   . Diabetes mellitus   . GERD (gastroesophageal reflux disease)     DEC 2010 EGD/Bx REACTIVE GASTROPATHY, NO VARICES  . Hemorrhoids, internal   . BMI 40.0-44.9, adult OCT 2010 269 LBS    APR 2012 279 LBS AUG 2014 185 LBS  . 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 IV drug abuse REMOTE  . Hepatitis 2010 HEP C    AST 509 ALT 267 ALK PHOS 165 ALB 3.8 NEG IGM HAV/HBSAg  . Gallstone AUG 2012 1 CM  . GERD 10/25/2009  . COPD (chronic obstructive pulmonary disease)   . Hepatitis C   . Pancytopenia 2013  . Splenomegaly 2013  . Other pancytopenia 02/18/2013  . Iron (Fe) deficiency anemia 02/18/2013  . Splenomegaly 02/18/2013  . Neuropathy   . Biliary dyskinesia JUL 2015    HIDA GB EF 5%  . Chronic pain   . Chronic leg pain     left   Past Surgical History  Procedure Laterality Date  . Sigmoidoscopy      2001 DR. FLEISCHMAN INTERNAL HERMORRHOIDS  . Upper gastrointestinal endoscopy  DEC 2010    BENIGN POLYPS, GASTRITIS, ?phg  . Knee surgery  RIGHT  . Hemorrhoid surgery    .  Esophageal biopsy  09/08/2011    Dr. Darrick Penna:Moderate gastritis/Polyps, multiple in the body of the stomach  . Colonoscopy with propofol N/A 11/15/2013    Dr. Darrick Penna: rectal varices, small AVMs  . Esophagogastroduodenoscopy (egd) with propofol N/A 11/15/2013    Dr. Darrick Penna: Grade 1 varices in distal esophagus, large polyp at the pylorus, moderate nodular gastritis. Next EGD in April 2016.     Allergies  Allergen Reactions  . Penicillins Hives   Current Outpatient Prescriptions  Medication Sig Dispense Refill  . acetaminophen (TYLENOL) 500 MG tablet Take 1,000 mg by mouth every 6 (six) hours as needed for mild pain or headache.     . albuterol (PROVENTIL HFA;VENTOLIN HFA) 108 (90 BASE) MCG/ACT inhaler Inhale 2 puffs into the lungs every 6 (six) hours as needed. Shortness of breath    . furosemide (LASIX) 20 MG tablet Take 1 tablet (20 mg total) by mouth daily.    Marland Kitchen glipiZIDE (GLUCOTROL) 10 MG tablet Take 1 tablet (10 mg total) by mouth daily.    . hydrOXYzine (ATARAX/VISTARIL) 10 MG tablet Take 1 tablet (10 mg total) by mouth every 4 (four) hours as needed for itching.    . insulin glargine (LANTUS) 100 UNIT/ML injection Inject 10 Units into the skin at bedtime.    Marland Kitchen  lactulose (CHRONULAC) 10 GM/15ML solution Take 15 mLs (10 g total) by mouth 3 (three) times daily.    Marland Kitchen omeprazole (PRILOSEC) 20 MG capsule Take 1 capsules by mouth daily.    . Oxycodone HCl 10 MG TABS 1 tablet every 6 hours PRN    .      Marland Kitchen spironolactone (ALDACTONE) 25 MG tablet Take on M-W-F        Review of Systems     Objective:   Physical Exam  Constitutional: He is oriented to person, place, and time. He appears well-developed and well-nourished. No distress.  HENT:  Head: Normocephalic and atraumatic.  Mouth/Throat: Oropharynx is clear and moist. No oropharyngeal exudate.  Eyes: Pupils are equal, round, and reactive to light. No scleral icterus.  Neck: Normal range of motion. Neck supple.  Cardiovascular: Normal  rate, regular rhythm and normal heart sounds.   Pulmonary/Chest: Effort normal and breath sounds normal. No respiratory distress. He exhibits tenderness.  Abdominal: Soft. Bowel sounds are normal. He exhibits no distension. There is no tenderness.  Musculoskeletal: He exhibits edema (TRACE-1+ BIL LE).  Lymphadenopathy:    He has no cervical adenopathy.  Neurological: He is alert and oriented to person, place, and time.  NO FOCAL DEFICITS   Psychiatric: He has a normal mood and affect.  Vitals reviewed.         Assessment & Plan:

## 2014-12-21 NOTE — Telephone Encounter (Signed)
Pt is set up for [place] on May 19th @ 8:15 am. Letter is in the mail

## 2014-12-21 NOTE — Progress Notes (Signed)
ON RECALL LIST  °

## 2014-12-21 NOTE — Assessment & Plan Note (Addendum)
COMPLETED THERAPY MAR 2016. WELL COMPENSATED DISEASE.  NEEDS FOLLOW UP LABS. EXPLAINED TOPT HEP C CLINIC NEEDS LABS MAY 28 AND HE HAS AN APPT JUN 1. LETTER GIVEN TO PT. ASKED HIM TO CONTACT THE HEP C CLINIC. RUQ U/S Q6 MOS EGD UTD FOLLOW UP IN 6 MOS.

## 2014-12-21 NOTE — Addendum Note (Signed)
Addended by: Danie Binder on: 12/21/2014 11:24 AM   Modules accepted: Orders

## 2014-12-21 NOTE — Assessment & Plan Note (Signed)
ACUTE ONSET  PT NEED CT ANGIO OF CHEST TODAY TO EVALUATE FOR A PE. FOLLOW UP IN 6 MOS.

## 2014-12-21 NOTE — Telephone Encounter (Signed)
PATIENT ON June RECALL FOR REPEAT EGD 1-2 YRS

## 2014-12-21 NOTE — Progress Notes (Signed)
cc'ed to pcp °

## 2014-12-21 NOTE — Patient Instructions (Signed)
COMPLETE CT OF  YOUR CHEST.  WE WILL SCHEDULE RUQ U/S NEXT WEEK.  GET LABS DRAWN FOR HEP C CLINIC.   CONTINUE YOUR WEIGHT LOSS EFFORTS.  FOLLOW A LOW FAT/DIABETIC DIET.   CONTINUE OMEPRAZOLE.  TAKE 30 MINUTES PRIOR TO YOUR FIRST MEAL.  CONTINUE LACTULOSE.  FOLLOW UP IN 6 MOS.

## 2014-12-22 LAB — AFP TUMOR MARKER: AFP-Tumor Marker: 9.9 ng/mL — ABNORMAL HIGH (ref 0.0–8.3)

## 2014-12-25 LAB — HCV RNA QUANT
HCV QUANT LOG: 5.69 {Log} — AB (ref <1.18)
HCV Quantitative: 489297 IU/mL — ABNORMAL HIGH (ref <15)

## 2014-12-26 ENCOUNTER — Telehealth: Payer: Self-pay | Admitting: Gastroenterology

## 2014-12-26 NOTE — Telephone Encounter (Signed)
PLEASE CALL PT. HIS TUMOR MARKER IS NEGATIVE. HIS HEP C INFECTION DID NOT CLEAR. HE NEEDS TO SEE THE HEP C CLINIC FOR ADDITIONAL RECOMMENDATIONS.  FAX REPORT TO [facility].

## 2014-12-27 ENCOUNTER — Other Ambulatory Visit: Payer: Self-pay

## 2014-12-27 DIAGNOSIS — K7460 Unspecified cirrhosis of liver: Secondary | ICD-10-CM

## 2014-12-27 NOTE — Telephone Encounter (Signed)
Pt's wife is aware. Routing to Manuela Schwartz to fax.

## 2014-12-27 NOTE — Telephone Encounter (Signed)
Labs and other notes sent

## 2014-12-27 NOTE — Telephone Encounter (Signed)
PLEASE CALL PT. HIS CHEST CT DID NOT SHOW A CLOT IN HIS LUNGS. IT SHOWS BRONCHITIS AND HIS  HIS CHEST PAIN IS MOT LIKELY DUE TO COUGHING OR PLEURISY. HE SHOULD SEE HIS PCP TO MANAGE HIS BRONCHITIS.

## 2014-12-27 NOTE — Telephone Encounter (Signed)
PT's wife is aware of results.

## 2014-12-28 ENCOUNTER — Ambulatory Visit (HOSPITAL_COMMUNITY)
Admission: RE | Admit: 2014-12-28 | Discharge: 2014-12-28 | Disposition: A | Discharge status: Home / Self Care | Payer: Medicare Other | Source: Ambulatory Visit | Attending: Gastroenterology | Admitting: Gastroenterology

## 2014-12-28 ENCOUNTER — Other Ambulatory Visit: Payer: Self-pay

## 2014-12-28 DIAGNOSIS — K8000 Calculus of gallbladder with acute cholecystitis without obstruction: Secondary | ICD-10-CM

## 2014-12-28 DIAGNOSIS — B182 Chronic viral hepatitis C: Secondary | ICD-10-CM | POA: Insufficient documentation

## 2014-12-28 DIAGNOSIS — K7460 Unspecified cirrhosis of liver: Secondary | ICD-10-CM

## 2014-12-28 NOTE — Telephone Encounter (Signed)
I PERSONALLY REVIEWED THE U/S WITH DR. Maryland Pink. CHILD PUGH B(7)-Abdominal surgery peri-operative mortality: 30%.

## 2014-12-28 NOTE — Telephone Encounter (Addendum)
PLEASE CALL PT. HIS U/S SHOWS GALLSTONES AND GB WALL THAT IS THICK(POSSIBLE CHOLECYSTITIS). HE SHOULD SEE A SURGEON TO DISCUSS THE BENEFITS V. RISKS OF TAKING OUT HIS GALLBLADDER. HE SHOULD LET us KNOW IF HE WANTS TO SEE A GENERAL SURGEON IN [place] OR [place].

## 2014-12-28 NOTE — Telephone Encounter (Signed)
[name] could not see pt due to pt being in collections. Pt was sent to [facility].  Pt has appt on 01/12/2015. Pt aware of appt.

## 2014-12-28 NOTE — Telephone Encounter (Signed)
Noted  

## 2014-12-28 NOTE — Telephone Encounter (Signed)
Pt is aware of results. Wants to be seen by someone her in [place]. Referral made to [facility]

## 2015-01-01 ENCOUNTER — Telehealth: Payer: Self-pay

## 2015-01-01 NOTE — Telephone Encounter (Signed)
Pt's wife is calling to see if [facility] has talked with Dawn at the Hep [facility] clinic. She said that they told them that he was not a candidate for surgery. Please advise

## 2015-01-01 NOTE — Telephone Encounter (Addendum)
Pt's wife is aware and they will keep appointment in [place].

## 2015-01-01 NOTE — Telephone Encounter (Signed)
PLEASE CALL PT. PT SHOULD SPEAK WITH SURGEON REGARDING RISK FOR SURGERY BUT IF LIVER DISEASE IS NOT UNDER GOOD CONTROL HE SHOULD NOT HAVE SURGERY.

## 2015-01-15 ENCOUNTER — Emergency Department (HOSPITAL_COMMUNITY): Payer: Medicare Other

## 2015-01-15 ENCOUNTER — Inpatient Hospital Stay (HOSPITAL_COMMUNITY)
Admission: EM | Admit: 2015-01-15 | Discharge: 2015-01-18 | DRG: 442 | Disposition: A | Discharge status: Home / Self Care | Payer: Medicare Other | Attending: Internal Medicine | Admitting: Internal Medicine

## 2015-01-15 ENCOUNTER — Encounter (HOSPITAL_COMMUNITY): Payer: Self-pay | Admitting: Emergency Medicine

## 2015-01-15 DIAGNOSIS — I10 Essential (primary) hypertension: Secondary | ICD-10-CM | POA: Diagnosis present

## 2015-01-15 DIAGNOSIS — R1110 Vomiting, unspecified: Secondary | ICD-10-CM

## 2015-01-15 DIAGNOSIS — R1011 Right upper quadrant pain: Secondary | ICD-10-CM | POA: Diagnosis present

## 2015-01-15 DIAGNOSIS — D696 Thrombocytopenia, unspecified: Secondary | ICD-10-CM | POA: Diagnosis present

## 2015-01-15 DIAGNOSIS — E669 Obesity, unspecified: Secondary | ICD-10-CM | POA: Diagnosis present

## 2015-01-15 DIAGNOSIS — Z794 Long term (current) use of insulin: Secondary | ICD-10-CM | POA: Diagnosis not present

## 2015-01-15 DIAGNOSIS — K7290 Hepatic failure, unspecified without coma: Principal | ICD-10-CM | POA: Diagnosis present

## 2015-01-15 DIAGNOSIS — B182 Chronic viral hepatitis C: Secondary | ICD-10-CM | POA: Diagnosis present

## 2015-01-15 DIAGNOSIS — D509 Iron deficiency anemia, unspecified: Secondary | ICD-10-CM | POA: Diagnosis present

## 2015-01-15 DIAGNOSIS — K219 Gastro-esophageal reflux disease without esophagitis: Secondary | ICD-10-CM | POA: Diagnosis present

## 2015-01-15 DIAGNOSIS — D638 Anemia in other chronic diseases classified elsewhere: Secondary | ICD-10-CM | POA: Diagnosis present

## 2015-01-15 DIAGNOSIS — K7200 Acute and subacute hepatic failure without coma: Secondary | ICD-10-CM | POA: Diagnosis present

## 2015-01-15 DIAGNOSIS — R7989 Other specified abnormal findings of blood chemistry: Secondary | ICD-10-CM | POA: Insufficient documentation

## 2015-01-15 DIAGNOSIS — R188 Other ascites: Secondary | ICD-10-CM | POA: Diagnosis present

## 2015-01-15 DIAGNOSIS — K766 Portal hypertension: Secondary | ICD-10-CM | POA: Diagnosis present

## 2015-01-15 DIAGNOSIS — N508 Other specified disorders of male genital organs: Secondary | ICD-10-CM | POA: Diagnosis present

## 2015-01-15 DIAGNOSIS — N481 Balanitis: Secondary | ICD-10-CM | POA: Diagnosis present

## 2015-01-15 DIAGNOSIS — E119 Type 2 diabetes mellitus without complications: Secondary | ICD-10-CM | POA: Diagnosis present

## 2015-01-15 DIAGNOSIS — Z6841 Body Mass Index (BMI) 40.0 and over, adult: Secondary | ICD-10-CM

## 2015-01-15 DIAGNOSIS — R319 Hematuria, unspecified: Secondary | ICD-10-CM

## 2015-01-15 DIAGNOSIS — N5089 Other specified disorders of the male genital organs: Secondary | ICD-10-CM

## 2015-01-15 DIAGNOSIS — R809 Proteinuria, unspecified: Secondary | ICD-10-CM

## 2015-01-15 DIAGNOSIS — K8020 Calculus of gallbladder without cholecystitis without obstruction: Secondary | ICD-10-CM | POA: Diagnosis present

## 2015-01-15 DIAGNOSIS — K7031 Alcoholic cirrhosis of liver with ascites: Secondary | ICD-10-CM | POA: Diagnosis present

## 2015-01-15 DIAGNOSIS — K7460 Unspecified cirrhosis of liver: Secondary | ICD-10-CM

## 2015-01-15 DIAGNOSIS — T1490XA Injury, unspecified, initial encounter: Secondary | ICD-10-CM

## 2015-01-15 DIAGNOSIS — J449 Chronic obstructive pulmonary disease, unspecified: Secondary | ICD-10-CM | POA: Diagnosis present

## 2015-01-15 DIAGNOSIS — R945 Abnormal results of liver function studies: Secondary | ICD-10-CM

## 2015-01-15 HISTORY — DX: Unspecified asthma, uncomplicated: J45.909

## 2015-01-15 HISTORY — DX: Portal hypertension: K76.6

## 2015-01-15 LAB — I-STAT CG4 LACTIC ACID, ED
LACTIC ACID, VENOUS: 1.33 mmol/L (ref 0.5–2.0)
Lactic Acid, Venous: 1.07 mmol/L (ref 0.5–2.0)

## 2015-01-15 LAB — CBC WITH DIFFERENTIAL/PLATELET
BASOS PCT: 0 % (ref 0–1)
Basophils Absolute: 0 10*3/uL (ref 0.0–0.1)
Eosinophils Absolute: 0.1 10*3/uL (ref 0.0–0.7)
Eosinophils Relative: 1 % (ref 0–5)
HCT: 39.6 % (ref 39.0–52.0)
Hemoglobin: 12.9 g/dL — ABNORMAL LOW (ref 13.0–17.0)
Lymphocytes Relative: 19 % (ref 12–46)
Lymphs Abs: 1.1 10*3/uL (ref 0.7–4.0)
MCH: 31.6 pg (ref 26.0–34.0)
MCHC: 32.6 g/dL (ref 30.0–36.0)
MCV: 97.1 fL (ref 78.0–100.0)
MONOS PCT: 11 % (ref 3–12)
Monocytes Absolute: 0.7 10*3/uL (ref 0.1–1.0)
NEUTROS ABS: 4.1 10*3/uL (ref 1.7–7.7)
NEUTROS PCT: 69 % (ref 43–77)
Platelets: 61 10*3/uL — ABNORMAL LOW (ref 150–400)
RBC: 4.08 MIL/uL — ABNORMAL LOW (ref 4.22–5.81)
RDW: 15.5 % (ref 11.5–15.5)
WBC: 6 10*3/uL (ref 4.0–10.5)

## 2015-01-15 LAB — BASIC METABOLIC PANEL
Anion gap: 3 — ABNORMAL LOW (ref 5–15)
BUN: 14 mg/dL (ref 6–20)
CO2: 24 mmol/L (ref 22–32)
Calcium: 8 mg/dL — ABNORMAL LOW (ref 8.9–10.3)
Chloride: 107 mmol/L (ref 101–111)
Creatinine, Ser: 0.75 mg/dL (ref 0.61–1.24)
GFR calc Af Amer: >60 mL/min (ref >60)
GFR calc non Af Amer: >60 mL/min (ref >60)
GLUCOSE: 228 mg/dL — AB (ref 65–99)
Potassium: 4.5 mmol/L (ref 3.5–5.1)
Sodium: 134 mmol/L — ABNORMAL LOW (ref 135–145)

## 2015-01-15 LAB — URINALYSIS, ROUTINE W REFLEX MICROSCOPIC
Glucose, UA: 500 mg/dL — AB
LEUKOCYTES UA: NEGATIVE
NITRITE: NEGATIVE
PH: 5.5 (ref 5.0–8.0)
PROTEIN: 100 mg/dL — AB
Urobilinogen, UA: 1 mg/dL (ref 0.0–1.0)

## 2015-01-15 LAB — URINE MICROSCOPIC-ADD ON

## 2015-01-15 LAB — GLUCOSE, CAPILLARY: Glucose-Capillary: 153 mg/dL — ABNORMAL HIGH (ref 65–99)

## 2015-01-15 LAB — HEPATIC FUNCTION PANEL
ALT: 324 U/L — ABNORMAL HIGH (ref 17–63)
AST: 595 U/L — AB (ref 15–41)
Albumin: 2.9 g/dL — ABNORMAL LOW (ref 3.5–5.0)
Alkaline Phosphatase: 151 U/L — ABNORMAL HIGH (ref 38–126)
BILIRUBIN DIRECT: 3.6 mg/dL — AB (ref 0.1–0.5)
BILIRUBIN INDIRECT: 2.8 mg/dL — AB (ref 0.3–0.9)
Total Bilirubin: 6.4 mg/dL — ABNORMAL HIGH (ref 0.3–1.2)
Total Protein: 6.8 g/dL (ref 6.5–8.1)

## 2015-01-15 LAB — PROTIME-INR
INR: 1.25 (ref 0.00–1.49)
Prothrombin Time: 15.8 seconds — ABNORMAL HIGH (ref 11.6–15.2)

## 2015-01-15 LAB — CBG MONITORING, ED: Glucose-Capillary: 194 mg/dL — ABNORMAL HIGH (ref 65–99)

## 2015-01-15 LAB — APTT: aPTT: 31 seconds (ref 24–37)

## 2015-01-15 LAB — AMMONIA: AMMONIA: 84 umol/L — AB (ref 9–35)

## 2015-01-15 MED ORDER — INSULIN ASPART 100 UNIT/ML ~~LOC~~ SOLN: 0.0000 [IU] | Freq: Every day | SUBCUTANEOUS | Status: DC

## 2015-01-15 MED ORDER — MORPHINE SULFATE 2 MG/ML IJ SOLN
2.0000 mg | Freq: Once | INTRAMUSCULAR | Status: AC
  Administered 2015-01-15: 2 mg via INTRAVENOUS
  Filled 2015-01-15: qty 1

## 2015-01-15 MED ORDER — ALBUTEROL SULFATE (2.5 MG/3ML) 0.083% IN NEBU: 3.0000 mL | INHALATION_SOLUTION | Freq: Four times a day (QID) | RESPIRATORY_TRACT | Status: DC | PRN

## 2015-01-15 MED ORDER — PANTOPRAZOLE SODIUM 40 MG PO TBEC
40.0000 mg | DELAYED_RELEASE_TABLET | Freq: Every day | ORAL | Status: DC
  Administered 2015-01-15 – 2015-01-16 (×2): 40 mg via ORAL
  Filled 2015-01-15 (×2): qty 1

## 2015-01-15 MED ORDER — FUROSEMIDE 10 MG/ML IJ SOLN
40.0000 mg | Freq: Two times a day (BID) | INTRAMUSCULAR | Status: DC
  Administered 2015-01-15 – 2015-01-17 (×5): 40 mg via INTRAVENOUS
  Filled 2015-01-15 (×5): qty 4

## 2015-01-15 MED ORDER — MORPHINE SULFATE 4 MG/ML IJ SOLN
2.0000 mg | Freq: Once | INTRAMUSCULAR | Status: AC
  Administered 2015-01-15: 2 mg via INTRAVENOUS
  Filled 2015-01-15: qty 1

## 2015-01-15 MED ORDER — ONDANSETRON HCL 4 MG PO TABS: 4.0000 mg | ORAL_TABLET | Freq: Four times a day (QID) | ORAL | Status: DC | PRN

## 2015-01-15 MED ORDER — FUROSEMIDE 10 MG/ML IJ SOLN
40.0000 mg | INTRAMUSCULAR | Status: AC
  Administered 2015-01-15: 40 mg via INTRAVENOUS
  Filled 2015-01-15: qty 4

## 2015-01-15 MED ORDER — CIPROFLOXACIN IN D5W 400 MG/200ML IV SOLN
400.0000 mg | Freq: Once | INTRAVENOUS | Status: AC
Start: 2015-01-15 — End: 2015-01-15
  Administered 2015-01-15: 400 mg via INTRAVENOUS
  Filled 2015-01-15: qty 200

## 2015-01-15 MED ORDER — INSULIN GLARGINE 100 UNIT/ML ~~LOC~~ SOLN
10.0000 [IU] | Freq: Every day | SUBCUTANEOUS | Status: DC
  Administered 2015-01-15 – 2015-01-17 (×3): 10 [IU] via SUBCUTANEOUS
  Filled 2015-01-15 (×4): qty 0.1

## 2015-01-15 MED ORDER — CYCLOBENZAPRINE HCL 10 MG PO TABS
5.0000 mg | ORAL_TABLET | Freq: Three times a day (TID) | ORAL | Status: DC | PRN
  Administered 2015-01-16 – 2015-01-17 (×2): 5 mg via ORAL
  Filled 2015-01-15 (×2): qty 1

## 2015-01-15 MED ORDER — IOHEXOL 300 MG/ML  SOLN
100.0000 mL | Freq: Once | INTRAMUSCULAR | Status: AC | PRN
  Administered 2015-01-15: 100 mL via INTRAVENOUS

## 2015-01-15 MED ORDER — CIPROFLOXACIN IN D5W 400 MG/200ML IV SOLN
400.0000 mg | Freq: Two times a day (BID) | INTRAVENOUS | Status: DC
  Administered 2015-01-16 (×2): 400 mg via INTRAVENOUS
  Filled 2015-01-15 (×2): qty 200

## 2015-01-15 MED ORDER — INSULIN ASPART 100 UNIT/ML ~~LOC~~ SOLN
0.0000 [IU] | Freq: Three times a day (TID) | SUBCUTANEOUS | Status: DC
  Administered 2015-01-16 (×3): 3 [IU] via SUBCUTANEOUS
  Administered 2015-01-17: 4 [IU] via SUBCUTANEOUS
  Administered 2015-01-17 (×2): 3 [IU] via SUBCUTANEOUS
  Administered 2015-01-18: 4 [IU] via SUBCUTANEOUS

## 2015-01-15 MED ORDER — ONDANSETRON HCL 4 MG/2ML IJ SOLN
4.0000 mg | Freq: Four times a day (QID) | INTRAMUSCULAR | Status: DC | PRN
  Administered 2015-01-16 – 2015-01-17 (×2): 4 mg via INTRAVENOUS
  Filled 2015-01-15 (×2): qty 2

## 2015-01-15 MED ORDER — RIFAXIMIN 550 MG PO TABS
550.0000 mg | ORAL_TABLET | Freq: Two times a day (BID) | ORAL | Status: DC
  Administered 2015-01-15 – 2015-01-18 (×6): 550 mg via ORAL
  Filled 2015-01-15 (×6): qty 1

## 2015-01-15 MED ORDER — OXYCODONE HCL 5 MG PO TABS
10.0000 mg | ORAL_TABLET | Freq: Four times a day (QID) | ORAL | Status: DC | PRN
Start: 2015-01-15 — End: 2015-01-18
  Administered 2015-01-15 – 2015-01-18 (×11): 10 mg via ORAL
  Filled 2015-01-15 (×11): qty 2

## 2015-01-15 MED ORDER — ONDANSETRON HCL 4 MG/2ML IJ SOLN
4.0000 mg | Freq: Three times a day (TID) | INTRAMUSCULAR | Status: DC | PRN
Start: 2015-01-15 — End: 2015-01-15

## 2015-01-15 MED ORDER — ACETAMINOPHEN 500 MG PO TABS
1000.0000 mg | ORAL_TABLET | Freq: Four times a day (QID) | ORAL | Status: DC | PRN
  Administered 2015-01-16 (×2): 1000 mg via ORAL
  Filled 2015-01-15 (×2): qty 2

## 2015-01-15 MED ORDER — LACTULOSE 10 GM/15ML PO SOLN
10.0000 g | Freq: Three times a day (TID) | ORAL | Status: DC
Start: 2015-01-15 — End: 2015-01-16
  Administered 2015-01-15 – 2015-01-16 (×3): 10 g via ORAL
  Filled 2015-01-15 (×3): qty 30

## 2015-01-15 MED ORDER — SPIRONOLACTONE 25 MG PO TABS
25.0000 mg | ORAL_TABLET | ORAL | Status: DC
  Administered 2015-01-15 – 2015-01-17 (×2): 25 mg via ORAL
  Filled 2015-01-15 (×2): qty 1

## 2015-01-15 MED ORDER — HYDROXYZINE HCL 10 MG PO TABS
10.0000 mg | ORAL_TABLET | ORAL | Status: DC | PRN
  Administered 2015-01-16: 10 mg via ORAL
  Filled 2015-01-15: qty 1

## 2015-01-15 MED ORDER — GLIPIZIDE 5 MG PO TABS
10.0000 mg | ORAL_TABLET | Freq: Every day | ORAL | Status: DC
  Administered 2015-01-16 – 2015-01-18 (×3): 10 mg via ORAL
  Filled 2015-01-15 (×3): qty 2

## 2015-01-15 NOTE — H&P (Addendum)
[facility] History and Physical  [name] AJO:[id number] DOB: 09/24/1960 DOA: 01/15/2015  Referring physician: ER, Dr. Sabra Heck PCP: [name], MD   Chief Complaint: Abdominal pain, right upper quadrant  HPI: [name] is a 54 y.o. male  This is a 15 on [name] is a history of cirrhosis of the liver primarily secondary to hepatitis C, now presents with a four-day history of right sided upper quadrant pain. He also has had swelling in his scrotum and penis for the last 2 days. He has gained over 15 pounds in weight. He notices that his urine is dark colored. He denies any confusion. Evaluation in the emergency room shows cirrhotic liver with ascites and left-sided spleen or renal shunt. There are gallstones present. He has not been febrile. He has not had any nausea or vomiting. There is no diarrhea. He is now being admitted for further management.   Review of Systems:  Apart from symptoms above, all systems negative.   Past Medical History  Diagnosis Date  . Hypertension   . Diabetes mellitus   . GERD (gastroesophageal reflux disease)     DEC 2010 EGD/Bx REACTIVE GASTROPATHY, NO VARICES  . Hemorrhoids, internal   . BMI 40.0-44.9, adult OCT 2010 269 LBS    APR 2012 279 LBS AUG 2014 185 LBS  . 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 IV drug abuse REMOTE  . Hepatitis 2010 HEP C    AST 509 ALT 267 ALK PHOS 165 ALB 3.8 NEG IGM HAV/HBSAg  . Gallstone AUG 2012 1 CM  . GERD 10/25/2009  . COPD (chronic obstructive pulmonary disease)   . Hepatitis C   . Pancytopenia 2013  . Splenomegaly 2013  . Other pancytopenia 02/18/2013  . Iron (Fe) deficiency anemia 02/18/2013  . Splenomegaly 02/18/2013  . Neuropathy   . Biliary dyskinesia JUL 2015    HIDA GB EF 5%  . Chronic pain   . Chronic leg pain     left   Past Surgical History  Procedure Laterality Date  . Sigmoidoscopy      2001 DR. FLEISCHMAN INTERNAL HERMORRHOIDS  . Upper gastrointestinal  endoscopy  DEC 2010    BENIGN POLYPS, GASTRITIS, ?phg  . Knee surgery  RIGHT  . Hemorrhoid surgery    . Esophageal biopsy  09/08/2011    [name]:Moderate gastritis/Polyps, multiple in the body of the stomach  . Colonoscopy with propofol N/A 11/15/2013    [name]: rectal varices, small AVMs  . Esophagogastroduodenoscopy (egd) with propofol N/A 11/15/2013    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 Next EGD in April 2016.     Social History:  reports that he has never smoked. He has never used smokeless tobacco. He reports that he does not drink alcohol or use illicit drugs.  Allergies  Allergen Reactions  . Penicillins Hives    Family History  Problem Relation Age of Onset  . Colon cancer Neg Hx   . Anesthesia problems Neg Hx   . Hypotension Neg Hx   . Malignant hyperthermia Neg Hx   . Pseudochol deficiency Neg Hx   . Cancer Father     Prior to Admission medications   Medication Sig Start Date End Date Taking? Authorizing Provider  acetaminophen (TYLENOL) 500 MG tablet Take 1,000 mg by mouth every 6 (six) hours as needed for mild pain or headache.    Yes Historical Provider,  MD  albuterol (PROVENTIL HFA;VENTOLIN HFA) 108 (90 BASE) MCG/ACT inhaler Inhale 2 puffs into the lungs every 6 (six) hours as needed. Shortness of breath 08/14/14  Yes [name], MD  cyclobenzaprine (FLEXERIL) 5 MG tablet 1-2 PO QHS TO PREVENT MUSCLE CRAMPS 12/21/14  Yes Danie Binder, MD  furosemide (LASIX) 20 MG tablet Take 1 tablet (20 mg total) by mouth daily. 11/21/14  Yes [name], MD  glipiZIDE (GLUCOTROL) 10 MG tablet Take 1 tablet (10 mg total) by mouth daily. 10/16/14  Yes [name], MD  hydrOXYzine (ATARAX/VISTARIL) 10 MG tablet Take 1 tablet (10 mg total) by mouth every 4 (four) hours as needed for itching. 12/05/14  Yes [name], MD  insulin glargine (LANTUS) 100 UNIT/ML injection Inject 10 Units into the skin  at bedtime.   Yes Historical Provider, MD  lactulose (CHRONULAC) 10 GM/15ML solution Take 15 mLs (10 g total) by mouth 3 (three) times daily. 12/19/14  Yes [name], MD  omeprazole (PRILOSEC) 20 MG capsule Take 2 capsules (40 mg total) by mouth daily. 10/16/14  Yes [name], MD  Oxycodone HCl 10 MG TABS 1 tablet every 6 hours PRN 12/19/14  Yes [name], MD  rifaximin (XIFAXAN) 550 MG TABS tablet Take 1 tablet (550 mg total) by mouth 2 (two) times daily. 12/19/14  Yes [name], MD  spironolactone (ALDACTONE) 25 MG tablet Take on M-W-F Patient taking differently: Take 25 mg by mouth 3 (three) times a week. Take on M-W-F 12/05/14  Yes [name], MD   Physical Exam: Filed Vitals:   01/15/15 1745 01/15/15 1800 01/15/15 1922 01/15/15 2018  BP:  153/75 143/66 172/66  Pulse: 94 81 80 80  Temp:   98.1 F (36.7 C) 98.5 F (36.9 C)  TempSrc:   Oral Oral  Resp:  $Remo'18 22 22  'NqHqv$ Height:      Weight:    133.176 kg (293 lb 9.6 oz)  SpO2: 100% 96% 98% 100%    Wt Readings from Last 3 Encounters:  01/15/15 133.176 kg (293 lb 9.6 oz)  12/21/14 134.174 kg (295 lb 12.8 oz)  12/05/14 134.718 kg (297 lb)    General:  Appears jaundiced. He is alert and orientated without any evidence of hepatic encephalopathy. Eyes: PERRL, normal lids, irises & conjunctiva ENT: grossly normal hearing, lips & tongue Neck: no LAD, masses or thyromegaly Cardiovascular: RRR, no m/r/g. No LE edema. Telemetry: SR, no arrhythmias  Respiratory: CTA bilaterally, no w/r/r. Normal respiratory effort. Abdomen: soft, tender in the right upper quadrant. Abdomen is distended, consistent with ascites. Scrotum is also swollen and edematous. He has also pitting edema in both his legs. Skin: no rash or induration seen on limited exam Musculoskeletal: grossly normal tone BUE/BLE Psychiatric: grossly normal mood and affect, speech fluent and appropriate Neurologic: grossly non-focal.          Labs on  Admission:  Basic Metabolic Panel:  Recent Labs Lab 01/15/15 1430  NA 134*  K 4.5  CL 107  CO2 24  GLUCOSE 228*  BUN 14  CREATININE 0.75  CALCIUM 8.0*   Liver Function Tests:  Recent Labs Lab 01/15/15 1430  AST 595*  ALT 324*  ALKPHOS 151*  BILITOT 6.4*  PROT 6.8  ALBUMIN 2.9*   No results for input(s): LIPASE, AMYLASE in the last 168 hours.  Recent Labs Lab 01/15/15 1516  AMMONIA 84*   CBC:  Recent Labs Lab 01/15/15 1430  WBC 6.0  NEUTROABS 4.1  HGB 12.9*  HCT 39.6  MCV 97.1  PLT 61*   Cardiac Enzymes: No results for input(s): CKTOTAL, CKMB, CKMBINDEX, TROPONINI in the last 168 hours.  BNP (last 3 results) No results for input(s): BNP in the last 8760 hours.  ProBNP (last 3 results)  Recent Labs  01/16/14 1450 07/31/14 1220  PROBNP 153.4* 582.6*    CBG:  Recent Labs Lab 01/15/15 1426  GLUCAP 194*    Radiological Exams on Admission: Ct Abdomen Pelvis W Contrast  01/15/2015   CLINICAL DATA:  Right lower quadrant pain for 2 days  EXAM: CT ABDOMEN AND PELVIS WITH CONTRAST  TECHNIQUE: Multidetector CT imaging of the abdomen and pelvis was performed using the standard protocol following bolus administration of intravenous contrast.  CONTRAST:  1103mL OMNIPAQUE IOHEXOL 300 MG/ML  SOLN  COMPARISON:  04/11/2014  FINDINGS: Lower chest: Changes of centrilobular and paraseptal emphysema noted. Calcified granulomas identified in the right middle lobe. No pleural effusion.  Hepatobiliary: The liver is cirrhotic. No focal liver abnormality noted. Gallstones identified. No biliary dilatation.  Pancreas: Unremarkable.  Spleen: The spleen measures 17 cm in length.  Adrenals/Urinary Tract: Normal appearance of the adrenal glands. The right kidney is normal. Stone within the inferior pole of the left kidney measures 7 mm. No hydronephrosis identified. The urinary bladder appears within normal limits.  Stomach/Bowel: Stomach is within normal limits. The small bowel  loops are on unremarkable. The appendix is visualized and appears normal. Unremarkable appearance of the colon.  Vascular/Lymphatic: Normal appearance of the abdominal aorta. There is a large left-sided splenorenal shunt this extends into the left inguinal canal and scrotum. No enlarged retroperitoneal or mesenteric adenopathy. No enlarged pelvic or inguinal lymph nodes.  Reproductive: Prostate gland and seminal vesicles are unremarkable.  Other: Moderate ascites noted within the abdomen or pelvis.  Musculoskeletal: Review of the visualized osseous structures is negative for aggressive lytic or sclerotic bone lesion.  IMPRESSION: 1. Morphologic features of liver compatible with cirrhosis. 2. Portal venous hypertension with splenomegaly, ascites and large left-sided splenorenal shunt. 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 3. Gallstones. 4. Nonobstructing left renal calculus.   Electronically Signed   By: Kerby Moors M.D.   On: 01/15/2015 17:20     Assessment/Plan   1. Decompensated liver disease. The liver transaminases are significantly elevated. We will obtain a PT/INR. Start intravenous Lasix. Gastroenterology consultation. 2. Diabetes. Continue with home medications and sliding scale of insulin. 3. Gallstones with possible cholecystitis. Start empirically intravenous ciprofloxacin. Surgical consultation.  Further recommendations will depend on patient's hospital progress.   Code Status: Full code.  DVT Prophylaxis: SCDs.  Family Communication: I discussed the plan with the patient at the bedside.   Disposition Plan: Home when medically stable.  Time spent: 60 minutes.  Doree Albee [facility] Pager [phone].

## 2015-01-15 NOTE — ED Notes (Signed)
Having pain two days, rates pain 8/10 and took oxycodone with no relief.  Pain is to right side and lower abdomen.  Vomited twice in last 24 hours.

## 2015-01-15 NOTE — ED Notes (Signed)
MD at bedside. 

## 2015-01-15 NOTE — ED Provider Notes (Signed)
CSN: [id number]     Arrival date & time 01/15/15  1229 History   First MD Initiated Contact with Patient 01/15/15 1419     Chief Complaint  Patient presents with  . Abdominal Pain     (Consider location/radiation/quality/duration/timing/severity/associated sxs/prior Treatment) HPI Comments: R sided abd pain, mid to lower Started 4 days ago - 10/10 Some associated weight gain, dry weight is 280, today 298.   Swelling in scrotum and Penis X 2 days ago.  Has some dysuria and dark colored urine. as well and back pain.Taking meds as prescribed, 3-4 BM's a day with lactulose.  No ETOH / Drug use. Normal PO intake but now having intermittent vomiting.  No hemotochezia / melena.   Patient is a 54 y.o. male presenting with abdominal pain.  Abdominal Pain   Past Medical History  Diagnosis Date  . Hypertension   . Diabetes mellitus   . GERD (gastroesophageal reflux disease)     DEC 2010 EGD/Bx REACTIVE GASTROPATHY, NO VARICES  . Hemorrhoids, internal   . BMI 40.0-44.9, adult OCT 2010 269 LBS    APR 2012 279 LBS AUG 2014 185 LBS  . 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 IV drug abuse REMOTE  . Hepatitis 2010 HEP C    AST 509 ALT 267 ALK PHOS 165 ALB 3.8 NEG IGM HAV/HBSAg  . Gallstone AUG 2012 1 CM  . GERD 10/25/2009  . COPD (chronic obstructive pulmonary disease)   . Hepatitis C   . Pancytopenia 2013  . Splenomegaly 2013  . Other pancytopenia 02/18/2013  . Iron (Fe) deficiency anemia 02/18/2013  . Splenomegaly 02/18/2013  . Neuropathy   . Biliary dyskinesia JUL 2015    HIDA GB EF 5%  . Chronic pain   . Chronic leg pain     left   Past Surgical History  Procedure Laterality Date  . Sigmoidoscopy      2001 DR. FLEISCHMAN INTERNAL HERMORRHOIDS  . Upper gastrointestinal endoscopy  DEC 2010    BENIGN POLYPS, GASTRITIS, ?phg  . Knee surgery  RIGHT  . Hemorrhoid surgery    . Esophageal biopsy  09/08/2011    [name]:Moderate gastritis/Polyps, multiple in the  body of the stomach  . Colonoscopy with propofol N/A 11/15/2013    [name]: rectal varices, small AVMs  . Esophagogastroduodenoscopy (egd) with propofol N/A 11/15/2013    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 Next EGD in April 2016.     Family History  Problem Relation Age of Onset  . Colon cancer Neg Hx   . Anesthesia problems Neg Hx   . Hypotension Neg Hx   . Malignant hyperthermia Neg Hx   . Pseudochol deficiency Neg Hx   . Cancer Father    History  Substance Use Topics  . Smoking status: Never Smoker   . Smokeless tobacco: Never Used  . Alcohol Use: No     Comment: 30 years ago    Review of Systems  Gastrointestinal: Positive for abdominal pain.  All other systems reviewed and are negative.     Allergies  Penicillins  Home Medications   Prior to Admission medications   Medication Sig Start Date End Date Taking? Authorizing Provider  acetaminophen (TYLENOL) 500 MG tablet Take 1,000 mg by mouth every 6 (six) hours as needed for mild pain or headache.    Yes Historical Provider, MD  albuterol (PROVENTIL HFA;VENTOLIN HFA) 108 (90  BASE) MCG/ACT inhaler Inhale 2 puffs into the lungs every 6 (six) hours as needed. Shortness of breath 08/14/14  Yes [name], MD  cyclobenzaprine (FLEXERIL) 5 MG tablet 1-2 PO QHS TO PREVENT MUSCLE CRAMPS 12/21/14  Yes Danie Binder, MD  furosemide (LASIX) 20 MG tablet Take 1 tablet (20 mg total) by mouth daily. 11/21/14  Yes [name], MD  glipiZIDE (GLUCOTROL) 10 MG tablet Take 1 tablet (10 mg total) by mouth daily. 10/16/14  Yes [name], MD  hydrOXYzine (ATARAX/VISTARIL) 10 MG tablet Take 1 tablet (10 mg total) by mouth every 4 (four) hours as needed for itching. 12/05/14  Yes [name], MD  insulin glargine (LANTUS) 100 UNIT/ML injection Inject 10 Units into the skin at bedtime.   Yes Historical Provider, MD  lactulose (CHRONULAC) 10 GM/15ML solution  Take 15 mLs (10 g total) by mouth 3 (three) times daily. 12/19/14  Yes [name], MD  omeprazole (PRILOSEC) 20 MG capsule Take 2 capsules (40 mg total) by mouth daily. 10/16/14  Yes [name], MD  Oxycodone HCl 10 MG TABS 1 tablet every 6 hours PRN 12/19/14  Yes [name], MD  rifaximin (XIFAXAN) 550 MG TABS tablet Take 1 tablet (550 mg total) by mouth 2 (two) times daily. 12/19/14  Yes [name], MD  spironolactone (ALDACTONE) 25 MG tablet Take on M-W-F Patient taking differently: Take 25 mg by mouth 3 (three) times a week. Take on M-W-F 12/05/14  Yes [name], MD   BP 153/75 mmHg  Pulse 81  Temp(Src) 98.3 F (36.8 C) (Oral)  Resp 18  Ht _0  (1.676 m)  Wt 298 lb (135.172 kg)  BMI 48.12 kg/m2  SpO2 96% Physical Exam  Constitutional: He appears well-developed and well-nourished. No distress.  HENT:  Head: Normocephalic and atraumatic.  Mouth/Throat: Oropharynx is clear and moist. No oropharyngeal exudate.  Eyes: Conjunctivae and EOM are normal. Pupils are equal, round, and reactive to light. Right eye exhibits no discharge. Left eye exhibits no discharge. Scleral icterus is present.  Neck: Normal range of motion. Neck supple. No JVD present. No thyromegaly present.  Cardiovascular: Normal rate, regular rhythm, normal heart sounds and intact distal pulses.  Exam reveals no gallop and no friction rub.   No murmur heard. Pulmonary/Chest: Effort normal and breath sounds normal. No respiratory distress. He has no wheezes. He has no rales.  Speaks in full sentences, no inc WOB, no tachypnea   Abdominal: Soft. Bowel sounds are normal. He exhibits no distension and no mass. There is tenderness ( mild bruising RUQ and R mid abd).  Distended, tense, no fluid wave. hypoactive bowel sounds.  TTP in the RUQ and lower abdomen, no guarding, no peritoneal signs.  HSM present.  Dull to perculssion  Musculoskeletal: Normal range of motion. He exhibits edema ( mild LE  bilateral symm pitting edema to knees). He exhibits no tenderness.  Lymphadenopathy:    He has no cervical adenopathy.  Neurological: He is alert. Coordination normal.  Alert, appropriate, speech clear, coordination normal, CN normal mental status, normal coordination, normal fnf.    Skin: Skin is warm and dry. No rash noted. No erythema.  Psychiatric: He has a normal mood and affect. His behavior is normal.  Nursing note and vitals reviewed.   ED Course  Procedures (including critical care time) Labs Review Labs Reviewed  CBC WITH DIFFERENTIAL/PLATELET - Abnormal; Notable for the following:    RBC 4.08 (*)  Hemoglobin 12.9 (*)    Platelets 61 (*)    All other components within normal limits  BASIC METABOLIC PANEL - Abnormal; Notable for the following:    Sodium 134 (*)    Glucose, Bld 228 (*)    Calcium 8.0 (*)    Anion gap 3 (*)    All other components within normal limits  URINALYSIS, ROUTINE W REFLEX MICROSCOPIC (NOT AT [facility]) - Abnormal; Notable for the following:    Color, Urine AMBER (*)    Specific Gravity, Urine >1.030 (*)    Glucose, UA 500 (*)    Hgb urine dipstick LARGE (*)    Bilirubin Urine LARGE (*)    Ketones, ur TRACE (*)    Protein, ur 100 (*)    All other components within normal limits  AMMONIA - Abnormal; Notable for the following:    Ammonia 84 (*)    All other components within normal limits  HEPATIC FUNCTION PANEL - Abnormal; Notable for the following:    Albumin 2.9 (*)    AST 595 (*)    ALT 324 (*)    Alkaline Phosphatase 151 (*)    Total Bilirubin 6.4 (*)    Bilirubin, Direct 3.6 (*)    Indirect Bilirubin 2.8 (*)    All other components within normal limits  URINE MICROSCOPIC-ADD ON - Abnormal; Notable for the following:    Bacteria, UA FEW (*)    All other components within normal limits  PROTIME-INR - Abnormal; Notable for the following:    Prothrombin Time 15.8 (*)    All other components within normal limits  CBG MONITORING, ED -  Abnormal; Notable for the following:    Glucose-Capillary 194 (*)    All other components within normal limits  APTT  I-STAT CG4 LACTIC ACID, ED  I-STAT CG4 LACTIC ACID, ED    Imaging Review Ct Abdomen Pelvis W Contrast  01/15/2015   CLINICAL DATA:  Right lower quadrant pain for 2 days  EXAM: CT ABDOMEN AND PELVIS WITH CONTRAST  TECHNIQUE: Multidetector CT imaging of the abdomen and pelvis was performed using the standard protocol following bolus administration of intravenous contrast.  CONTRAST:  153m OMNIPAQUE IOHEXOL 300 MG/ML  SOLN  COMPARISON:  04/11/2014  FINDINGS: Lower chest: Changes of centrilobular and paraseptal emphysema noted. Calcified granulomas identified in the right middle lobe. No pleural effusion.  Hepatobiliary: The liver is cirrhotic. No focal liver abnormality noted. Gallstones identified. No biliary dilatation.  Pancreas: Unremarkable.  Spleen: The spleen measures 17 cm in length.  Adrenals/Urinary Tract: Normal appearance of the adrenal glands. The right kidney is normal. Stone within the inferior pole of the left kidney measures 7 mm. No hydronephrosis identified. The urinary bladder appears within normal limits.  Stomach/Bowel: Stomach is within normal limits. The small bowel loops are on unremarkable. The appendix is visualized and appears normal. Unremarkable appearance of the colon.  Vascular/Lymphatic: Normal appearance of the abdominal aorta. There is a large left-sided splenorenal shunt this extends into the left inguinal canal and scrotum. No enlarged retroperitoneal or mesenteric adenopathy. No enlarged pelvic or inguinal lymph nodes.  Reproductive: Prostate gland and seminal vesicles are unremarkable.  Other: Moderate ascites noted within the abdomen or pelvis.  Musculoskeletal: Review of the visualized osseous structures is negative for aggressive lytic or sclerotic bone lesion.  IMPRESSION: 1. Morphologic features of liver compatible with cirrhosis. 2. Portal venous  hypertension with splenomegaly, ascites and large left-sided splenorenal shunt. The varix extends into the left inguinal canal  and scrotum. 3. Gallstones. 4. Nonobstructing left renal calculus.   Electronically Signed   By: Kerby Moors M.D.   On: 01/15/2015 17:20      MDM   Final diagnoses:  Trauma  Liver failure, acute  Ascites  Hematuria  Proteinuria    The patient appears as if he is in acute liver failure, his mental status is normal, his heart and lung exams are normal however his abdomen is increasingly tender and he does have a history of cholecystitis treated conservatively. His labs are concerning with acute liver injury, elevated bilirubin, CT scan shows that the patient has no acute findings to explain his symptoms, we'll rule out coagulopathy and liver synthetic function with coagulable studies, discussed with Dr. Anastasio Champion who will admit the patient.    Noemi Chapel, MD 01/15/15 [phone]

## 2015-01-16 ENCOUNTER — Encounter (HOSPITAL_COMMUNITY): Payer: Self-pay | Admitting: Internal Medicine

## 2015-01-16 DIAGNOSIS — K766 Portal hypertension: Secondary | ICD-10-CM | POA: Diagnosis present

## 2015-01-16 DIAGNOSIS — N5089 Other specified disorders of the male genital organs: Secondary | ICD-10-CM

## 2015-01-16 DIAGNOSIS — K7200 Acute and subacute hepatic failure without coma: Secondary | ICD-10-CM | POA: Diagnosis present

## 2015-01-16 LAB — COMPREHENSIVE METABOLIC PANEL
ALT: 262 U/L — ABNORMAL HIGH (ref 17–63)
ANION GAP: 6 (ref 5–15)
AST: 464 U/L — ABNORMAL HIGH (ref 15–41)
Albumin: 2.5 g/dL — ABNORMAL LOW (ref 3.5–5.0)
Alkaline Phosphatase: 129 U/L — ABNORMAL HIGH (ref 38–126)
BUN: 16 mg/dL (ref 6–20)
CO2: 26 mmol/L (ref 22–32)
CREATININE: 0.84 mg/dL (ref 0.61–1.24)
Calcium: 7.9 mg/dL — ABNORMAL LOW (ref 8.9–10.3)
Chloride: 102 mmol/L (ref 101–111)
GFR calc Af Amer: >60 mL/min (ref >60)
Glucose, Bld: 148 mg/dL — ABNORMAL HIGH (ref 65–99)
Potassium: 4 mmol/L (ref 3.5–5.1)
Sodium: 134 mmol/L — ABNORMAL LOW (ref 135–145)
Total Bilirubin: 7.5 mg/dL — ABNORMAL HIGH (ref 0.3–1.2)
Total Protein: 6.2 g/dL — ABNORMAL LOW (ref 6.5–8.1)

## 2015-01-16 LAB — RAPID URINE DRUG SCREEN, HOSP PERFORMED
AMPHETAMINES: NOT DETECTED
BENZODIAZEPINES: NOT DETECTED
Barbiturates: NOT DETECTED
COCAINE: NOT DETECTED
OPIATES: POSITIVE — AB
Tetrahydrocannabinol: NOT DETECTED

## 2015-01-16 LAB — CBC
HCT: 36.1 % — ABNORMAL LOW (ref 39.0–52.0)
HEMOGLOBIN: 12.2 g/dL — AB (ref 13.0–17.0)
MCH: 32.7 pg (ref 26.0–34.0)
MCHC: 33.8 g/dL (ref 30.0–36.0)
MCV: 96.8 fL (ref 78.0–100.0)
PLATELETS: 50 10*3/uL — AB (ref 150–400)
RBC: 3.73 MIL/uL — AB (ref 4.22–5.81)
RDW: 14.9 % (ref 11.5–15.5)
WBC: 4 10*3/uL (ref 4.0–10.5)

## 2015-01-16 LAB — GLUCOSE, CAPILLARY
Glucose-Capillary: 129 mg/dL — ABNORMAL HIGH (ref 65–99)
Glucose-Capillary: 123 mg/dL — ABNORMAL HIGH (ref 65–99)
Glucose-Capillary: 147 mg/dL — ABNORMAL HIGH (ref 65–99)
Glucose-Capillary: 137 mg/dL — ABNORMAL HIGH (ref 65–99)

## 2015-01-16 LAB — ACETAMINOPHEN LEVEL: ACETAMINOPHEN (TYLENOL), SERUM: 12 ug/mL (ref 10–30)

## 2015-01-16 LAB — PROTIME-INR
INR: 1.32 (ref 0.00–1.49)
PROTHROMBIN TIME: 16.5 s — AB (ref 11.6–15.2)

## 2015-01-16 LAB — ETHANOL

## 2015-01-16 MED ORDER — LACTULOSE 10 GM/15ML PO SOLN: 10.0000 g | Freq: Three times a day (TID) | ORAL | Status: DC | PRN

## 2015-01-16 MED ORDER — HYDROMORPHONE HCL 1 MG/ML IJ SOLN
0.5000 mg | Freq: Once | INTRAMUSCULAR | Status: AC
  Administered 2015-01-16: 0.5 mg via INTRAVENOUS
  Filled 2015-01-16: qty 1

## 2015-01-16 MED ORDER — PANTOPRAZOLE SODIUM 40 MG PO TBEC
40.0000 mg | DELAYED_RELEASE_TABLET | Freq: Two times a day (BID) | ORAL | Status: DC
  Administered 2015-01-16 – 2015-01-18 (×4): 40 mg via ORAL
  Filled 2015-01-16 (×4): qty 1

## 2015-01-16 NOTE — Consult Note (Signed)
Referring Provider: Dr. Karilyn Cota Primary Care Physician:  Frederica Kuster, MD Primary Gastroenterologist:  Dr. Darrick Penna   Date of Admission: 01/15/15 Date of Consultation: 01/16/15  Reason for Consultation:  Gallstones, question cholecystitis   HPI:  [name] is a 54 y.o. year old male with a history of Hep C/ETOH cirrhosis, hepatic encephalopathy, recently completed treatment of Hep C Mar 2016 without sustained virologic response (SVR), IDA, GERD, presenting with abdominal pain, elevated LFTs. Bilirubin 6.4 with transaminitis. Bilirubin increasing but transaminases improving. CT this admission with portal venous hypertension, ascites, large left-sided splenorenal shunt, gallstones. No biliary dilation.   Last seen outpatient May 2016 and underwent US abdomen that showed gallstones and gallbladder wall thickening with concerns for possible cholecystitis. Abdominal surgery peri-operative mortality 30%. He was referred to Dr. Lovell Sheehan but due to payment issues had to be sent to [facility]. Appt was scheduled 01/12/15.   Due for EGD for variceal surveillance 2016/2017. Notes he was swollen in his "privacy" for a few weeks. Notes lower abdominal discomfort, RLQ pain, right-sided pain that is chronic in nature. [facility] on Friday in [place] who did not recommend a cholecystectomy but opined a cholecystostomy tube if necessary. Scheduled to see Annamarie Major, NP on June 14th for follow-up of Hep C. Associated N/V. Yesterday ate breakfast than vomited. No hematemesis. Complains of dark urine. Denies fever or chills, denies confusion. States his lower back is throbbing after urinating. This morning ate french toast, oatmeal, and coffee. Denies pain with eating. Notes right-sided pain is chronic. Denies any ETOH use or drug use.   Past Medical History  Diagnosis Date  . Hypertension   . Diabetes mellitus   . GERD (gastroesophageal reflux disease)     DEC 2010 EGD/Bx  REACTIVE GASTROPATHY, NO VARICES  . Hemorrhoids, internal   . BMI 40.0-44.9, adult OCT 2010 269 LBS    APR 2012 279 LBS AUG 2014 185 LBS  . 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 IV drug abuse REMOTE  . Hepatitis 2010 HEP C    AST 509 ALT 267 ALK PHOS 165 ALB 3.8 NEG IGM HAV/HBSAg  . Gallstone AUG 2012 1 CM  . GERD 10/25/2009  . COPD (chronic obstructive pulmonary disease)   . Hepatitis C   . Pancytopenia 2013  . Splenomegaly 2013  . Other pancytopenia 02/18/2013  . Iron (Fe) deficiency anemia 02/18/2013  . Splenomegaly 02/18/2013  . Neuropathy   . Biliary dyskinesia JUL 2015    HIDA GB EF 5%  . Chronic pain   . Chronic leg pain     left    Past Surgical History  Procedure Laterality Date  . Sigmoidoscopy      2001 DR. FLEISCHMAN INTERNAL HERMORRHOIDS  . Upper gastrointestinal endoscopy  DEC 2010    BENIGN POLYPS, GASTRITIS, ?phg  . Knee surgery  RIGHT  . Hemorrhoid surgery    . Esophageal biopsy  09/08/2011    Dr. Darrick Penna:Moderate gastritis/Polyps, multiple in the body of the stomach  . Colonoscopy with propofol N/A 11/15/2013    Dr. Darrick Penna: rectal varices, small AVMs  . Esophagogastroduodenoscopy (egd) with propofol N/A 11/15/2013    Dr. Darrick Penna: Grade 1 varices in distal esophagus, large polyp at the pylorus, moderate nodular gastritis. Next EGD in April 2016.      Prior to Admission medications   Medication Sig Start Date End Date Taking? Authorizing Provider  acetaminophen (TYLENOL) 500 MG tablet Take 1,000 mg  by mouth every 6 (six) hours as needed for mild pain or headache.    Yes Historical Provider, MD  albuterol (PROVENTIL HFA;VENTOLIN HFA) 108 (90 BASE) MCG/ACT inhaler Inhale 2 puffs into the lungs every 6 (six) hours as needed. Shortness of breath 08/14/14  Yes [name], MD  cyclobenzaprine (FLEXERIL) 5 MG tablet 1-2 PO QHS TO PREVENT MUSCLE CRAMPS 12/21/14  Yes Danie Binder, MD  furosemide (LASIX) 20 MG tablet Take 1 tablet (20 mg total) by mouth  daily. 11/21/14  Yes [name], MD  glipiZIDE (GLUCOTROL) 10 MG tablet Take 1 tablet (10 mg total) by mouth daily. 10/16/14  Yes [name], MD  hydrOXYzine (ATARAX/VISTARIL) 10 MG tablet Take 1 tablet (10 mg total) by mouth every 4 (four) hours as needed for itching. 12/05/14  Yes [name], MD  insulin glargine (LANTUS) 100 UNIT/ML injection Inject 10 Units into the skin at bedtime.   Yes Historical Provider, MD  lactulose (CHRONULAC) 10 GM/15ML solution Take 15 mLs (10 g total) by mouth 3 (three) times daily. 12/19/14  Yes [name], MD  omeprazole (PRILOSEC) 20 MG capsule Take 2 capsules (40 mg total) by mouth daily. 10/16/14  Yes [name], MD  Oxycodone HCl 10 MG TABS 1 tablet every 6 hours PRN 12/19/14  Yes [name], MD  rifaximin (XIFAXAN) 550 MG TABS tablet Take 1 tablet (550 mg total) by mouth 2 (two) times daily. 12/19/14  Yes [name], MD  spironolactone (ALDACTONE) 25 MG tablet Take on M-W-F Patient taking differently: Take 25 mg by mouth 3 (three) times a week. Take on M-W-F 12/05/14  Yes [name], MD    Current Facility-Administered Medications  Medication Dose Route Frequency Provider Last Rate Last Dose  . acetaminophen (TYLENOL) tablet 1,000 mg  1,000 mg Oral Q6H PRN Doree Albee, MD   1,000 mg at 01/16/15 0248  . albuterol (PROVENTIL) (2.5 MG/3ML) 0.083% nebulizer solution 3 mL  3 mL Inhalation Q6H PRN Nimish C Gosrani, MD      . ciprofloxacin (CIPRO) IVPB 400 mg  400 mg Intravenous Q12H Nimish C Gosrani, MD   400 mg at 01/16/15 0404  . cyclobenzaprine (FLEXERIL) tablet 5 mg  5 mg Oral TID PRN Doree Albee, MD   5 mg at 01/16/15 0024  . furosemide (LASIX) injection 40 mg  40 mg Intravenous BID Doree Albee, MD   40 mg at 01/15/15 2208  . glipiZIDE (GLUCOTROL) tablet 10 mg  10 mg Oral QAC breakfast Nimish C Gosrani, MD      . hydrOXYzine (ATARAX/VISTARIL) tablet 10 mg  10 mg Oral Q4H PRN Doree Albee, MD   10 mg at  01/16/15 0024  . insulin aspart (novoLOG) injection 0-20 Units  0-20 Units Subcutaneous TID WC Nimish C Gosrani, MD      . insulin aspart (novoLOG) injection 0-5 Units  0-5 Units Subcutaneous QHS Doree Albee, MD   0 Units at 01/15/15 2200  . insulin glargine (LANTUS) injection 10 Units  10 Units Subcutaneous QHS Doree Albee, MD   10 Units at 01/15/15 2146  . lactulose (CHRONULAC) 10 GM/15ML solution 10 g  10 g Oral TID Doree Albee, MD   10 g at 01/15/15 2145  . ondansetron (ZOFRAN) tablet 4 mg  4 mg Oral Q6H PRN Nimish C Gosrani, MD       Or  . ondansetron (ZOFRAN) injection 4 mg  4  mg Intravenous Q6H PRN Nimish C Anastasio Champion, MD      . oxyCODONE (Oxy IR/ROXICODONE) immediate release tablet 10 mg  10 mg Oral Q6H PRN Nimish Luther Parody, MD   10 mg at 01/16/15 0404  . pantoprazole (PROTONIX) EC tablet 40 mg  40 mg Oral Daily Doree Albee, MD   40 mg at 01/15/15 2145  . rifaximin (XIFAXAN) tablet 550 mg  550 mg Oral BID Doree Albee, MD   550 mg at 01/15/15 2145  . spironolactone (ALDACTONE) tablet 25 mg  25 mg Oral Once per day on Mon Wed Fri Doree Albee, MD   25 mg at 01/15/15 2145    Allergies as of 01/15/2015 - Review Complete 01/15/2015  Allergen Reaction Noted  . Penicillins Hives 07/31/2014    Family History  Problem Relation Age of Onset  . Colon cancer Neg Hx   . Anesthesia problems Neg Hx   . Hypotension Neg Hx   . Malignant hyperthermia Neg Hx   . Pseudochol deficiency Neg Hx   . Cancer Father     History   Social History  . Marital Status: Married    Spouse Name: N/A  . Number of Children: N/A  . Years of Education: N/A   Occupational History  . Not on file.   Social History Main Topics  . Smoking status: Never Smoker   . Smokeless tobacco: Never Used  . Alcohol Use: No     Comment: 30 years ago  . Drug Use: No     Comment: 30 yrs ago.  Marland Kitchen Sexual Activity:    Partners: Female    Museum/gallery curator: Condom     Comment: spouse    Other Topics Concern  . Not on file   Social History Narrative    Review of Systems: Gen: see HPI CV: Denies chest pain, heart palpitations, syncope, edema  Resp: +DOE GI: see HPI GU : see HPI MS: Denies joint pain,swelling, cramping Derm: Denies rash, itching, dry skin Psych: see HPI Heme: Denies bruising, bleeding, and enlarged lymph nodes.  Physical Exam: Vital signs in last 24 hours: Temp:  [97.8 F (36.6 C)-98.5 F (36.9 C)] 97.8 F (36.6 C) (06/07 0634) Pulse Rate:  [66-94] 66 (06/07 0634) Resp:  [18-24] 22 (06/07 0634) BP: (130-172)/(62-82) 150/68 mmHg (06/07 0634) SpO2:  [96 %-100 %] 100 % (06/07 0634) Weight:  [293 lb 9.6 oz (133.176 kg)-298 lb (135.172 kg)] 293 lb 9.6 oz (133.176 kg) (06/06 2018) Last BM Date: 01/15/15 General:   Alert,  Well-developed, well-nourished, pleasant and cooperative in NAD Head:  Normocephalic and atraumatic. Eyes:  +scleral icterus Ears:  Normal auditory acuity. Nose:  No deformity, discharge,  or lesions. Lungs:  Clear throughout to auscultation.   No wheezes, crackles, or rhonchi. No acute distress. Heart:  S1 S2 present Abdomen:  Obese, TTP right lower quadrant, RUQ, lower abdomen, large AP diameter but liver margin palpable below right costal margin. Left scrotal edema Rectal:  Deferred until time of colonoscopy.   Msk:  Symmetrical without gross deformities. Normal posture. Extremities:  1+ lower extremity edema bilaterally to knee Neurologic:  Alert and  oriented x4;  grossly normal neurologically. Negative asterixis.  Psych:  Alert and cooperative. Normal mood and affect.  Intake/Output from previous day: 06/06 0701 - 06/07 0700 In: 920 [P.O.:720; IV Piggyback:200] Out: 500 [Urine:500] Intake/Output this shift:    Lab Results:  Recent Labs  01/15/15 1430 01/16/15 0557  WBC 6.0 4.0  HGB  12.9* 12.2*  HCT 39.6 36.1*  PLT 61* 50*   BMET  Recent Labs  01/15/15 1430 01/16/15 0557  NA 134* 134*  K 4.5 4.0   CL 107 102  CO2 24 26  GLUCOSE 228* 148*  BUN 14 16  CREATININE 0.75 0.84  CALCIUM 8.0* 7.9*   LFT  Recent Labs  01/15/15 1430 01/16/15 0557  PROT 6.8 6.2*  ALBUMIN 2.9* 2.5*  AST 595* 464*  ALT 324* 262*  ALKPHOS 151* 129*  BILITOT 6.4* 7.5*  BILIDIR 3.6*  --   IBILI 2.8*  --    PT/INR  Recent Labs  01/15/15 1430 01/16/15 0557  LABPROT 15.8* 16.5*  INR 1.25 1.32    Studies/Results: Ct Abdomen Pelvis W Contrast  01/15/2015   CLINICAL DATA:  Right lower quadrant pain for 2 days  EXAM: CT ABDOMEN AND PELVIS WITH CONTRAST  TECHNIQUE: Multidetector CT imaging of the abdomen and pelvis was performed using the standard protocol following bolus administration of intravenous contrast.  CONTRAST:  140mL OMNIPAQUE IOHEXOL 300 MG/ML  SOLN  COMPARISON:  04/11/2014  FINDINGS: Lower chest: Changes of centrilobular and paraseptal emphysema noted. Calcified granulomas identified in the right middle lobe. No pleural effusion.  Hepatobiliary: The liver is cirrhotic. No focal liver abnormality noted. Gallstones identified. No biliary dilatation.  Pancreas: Unremarkable.  Spleen: The spleen measures 17 cm in length.  Adrenals/Urinary Tract: Normal appearance of the adrenal glands. The right kidney is normal. Stone within the inferior pole of the left kidney measures 7 mm. No hydronephrosis identified. The urinary bladder appears within normal limits.  Stomach/Bowel: Stomach is within normal limits. The small bowel loops are on unremarkable. The appendix is visualized and appears normal. Unremarkable appearance of the colon.  Vascular/Lymphatic: Normal appearance of the abdominal aorta. There is a large left-sided splenorenal shunt this extends into the left inguinal canal and scrotum. No enlarged retroperitoneal or mesenteric adenopathy. No enlarged pelvic or inguinal lymph nodes.  Reproductive: Prostate gland and seminal vesicles are unremarkable.  Other: Moderate ascites noted within the abdomen or  pelvis.  Musculoskeletal: Review of the visualized osseous structures is negative for aggressive lytic or sclerotic bone lesion.  IMPRESSION: 1. Morphologic features of liver compatible with cirrhosis. 2. Portal venous hypertension with splenomegaly, ascites and large left-sided splenorenal shunt. 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 3. Gallstones. 4. Nonobstructing left renal calculus.   Electronically Signed   By: Kerby Moors M.D.   On: 01/15/2015 17:20    Impression: 54 year old male with history of HCV/ETOH cirrhosis (MELD 17), presenting with acute on chronic abdominal pain, scrotal edema, and concern for cholecystitis. He is not felt to have acute cholecystitis at this point, and surgical risk is high given comorbidities. If clinically needed, could consider cholecystostomy tube. As of note, Hep C not eradicated with recent treatments, finishing March 2016; he is scheduled to see the [facility] in [place] later this month. Continues to deny drug and ETOH use; however, he has tested positive for cocaine as of Dec 2015. Decompensated liver disease, with bilirubin trending up but transaminases improving from admission. CT without evidence for biliary dilation. Could consider MRCP if concerns for microlithiasis, doubt superimposed viral infection but will check markers along with ethanol and acetaminophen level, has known chronic Hep C. CT noting large left-sided splenorenal shunt, sequelae of chronic portal hypertension, with the varix extending into the left inguinal canal and scrotum.    Plan: Lactulose titrated to 3 soft bowel movements  daily, continue Xifaxan BID Urine drug screen, ethanol level, acetaminophen level, Hep B Repeat LFTs and INR tomorrow Not a candidate for cholecystectomy; monitor for signs/symptoms of acute cholecystitis, cholangitis Will continue to follow with you    Orvil Feil, ANP-BC [facility]    LOS: 1 day     01/16/2015, 8:02 AM

## 2015-01-16 NOTE — Progress Notes (Signed)
[facility] PROGRESS NOTE  [name] KGM:[id number] DOB: May 25, 1961 DOA: 01/15/2015 PCP: [name], MD  Assessment/Plan: 1. Decompensated liver disease. Liver transaminases remain elevated but trending downward. INR 1.3. continue intravenous Lasix. Lactulose per GI. Continue spironolactone, xifaxan. Recheck LFT in am.   Appreciate gi assistance.  2. Diabetes. Obtain A1c. CBG range 123-129.  Continue with home medications of lantus and glipizide. and sliding scale of insulin. 3. Gallstones with possible cholecystitis. Continue  Ciprofloxacin day #2. Evaluated by general surgery who opined not likley acute cholecystitis at this time and not  a surgical candidate. Defer to GI for management and care 4. Ascites: per CT related to #1.  5. Scrotal edema: related to severe portal hypertension related to #1.  6. Anemia: hx IDA in setting of chronic disease. Currently stable. Monitor 7. Thrombocytopenia: chronic related to #1. Current level at baseline. No evidence of overt bleeding. monitor   Code Status: full Family Communication: wife at bedside Disposition Plan: home when ready   Consultants:  Gi  General surgery  Procedures:  none  Antibiotics:  cipro  HPI/Subjective: Sitting up in bed. Reports some improvement in abdominal pain. Reports "losing my lunch". Tolerated breakfast without vomiting  Objective: Filed Vitals:   01/16/15 1329  BP: 115/52  Pulse: 73  Temp: 97.7 F (36.5 C)  Resp: 20    Intake/Output Summary (Last 24 hours) at 01/16/15 1334 Last data filed at 01/16/15 1250  Gross per 24 hour  Intake   1160 ml  Output   1300 ml  Net   -140 ml   Filed Weights   01/15/15 1237 01/15/15 2018  Weight: 135.172 kg (298 lb) 133.176 kg (293 lb 9.6 oz)    Exam:   General:  Obese, jaundice scleral icterus  Cardiovascular: RRR no MGR  Respiratory: normal effort BS clear no crackles  Abdomen: obese moderate tenderness to palpation on RUQ. No  rebounding abdomen distended with mild to moderate tension. Sluggish BS in lower quadrants  Musculoskeletal: no clubbing or cyanosis   Data Reviewed: Basic Metabolic Panel:  Recent Labs Lab 01/15/15 1430 01/16/15 0557  NA 134* 134*  K 4.5 4.0  CL 107 102  CO2 24 26  GLUCOSE 228* 148*  BUN 14 16  CREATININE 0.75 0.84  CALCIUM 8.0* 7.9*   Liver Function Tests:  Recent Labs Lab 01/15/15 1430 01/16/15 0557  AST 595* 464*  ALT 324* 262*  ALKPHOS 151* 129*  BILITOT 6.4* 7.5*  PROT 6.8 6.2*  ALBUMIN 2.9* 2.5*   No results for input(s): LIPASE, AMYLASE in the last 168 hours.  Recent Labs Lab 01/15/15 1516  AMMONIA 84*   CBC:  Recent Labs Lab 01/15/15 1430 01/16/15 0557  WBC 6.0 4.0  NEUTROABS 4.1  --   HGB 12.9* 12.2*  HCT 39.6 36.1*  MCV 97.1 96.8  PLT 61* 50*   Cardiac Enzymes: No results for input(s): CKTOTAL, CKMB, CKMBINDEX, TROPONINI in the last 168 hours. BNP (last 3 results) No results for input(s): BNP in the last 8760 hours.  ProBNP (last 3 results)  Recent Labs  01/16/14 1450 07/31/14 1220  PROBNP 153.4* 582.6*    CBG:  Recent Labs Lab 01/15/15 1426 01/15/15 2041 01/16/15 0731 01/16/15 1113  GLUCAP 194* 153* 129* 123*    No results found for this or any previous visit (from the past 240 hour(s)).   Studies: Ct Abdomen Pelvis W Contrast  01/15/2015   CLINICAL DATA:  Right lower quadrant pain for 2 days  EXAM: CT ABDOMEN AND PELVIS WITH CONTRAST  TECHNIQUE: Multidetector CT imaging of the abdomen and pelvis was performed using the standard protocol following bolus administration of intravenous contrast.  CONTRAST:  152mL OMNIPAQUE IOHEXOL 300 MG/ML  SOLN  COMPARISON:  04/11/2014  FINDINGS: Lower chest: Changes of centrilobular and paraseptal emphysema noted. Calcified granulomas identified in the right middle lobe. No pleural effusion.  Hepatobiliary: The liver is cirrhotic. No focal liver abnormality noted. Gallstones identified. No  biliary dilatation.  Pancreas: Unremarkable.  Spleen: The spleen measures 17 cm in length.  Adrenals/Urinary Tract: Normal appearance of the adrenal glands. The right kidney is normal. Stone within the inferior pole of the left kidney measures 7 mm. No hydronephrosis identified. The urinary bladder appears within normal limits.  Stomach/Bowel: Stomach is within normal limits. The small bowel loops are on unremarkable. The appendix is visualized and appears normal. Unremarkable appearance of the colon.  Vascular/Lymphatic: Normal appearance of the abdominal aorta. There is a large left-sided splenorenal shunt this extends into the left inguinal canal and scrotum. No enlarged retroperitoneal or mesenteric adenopathy. No enlarged pelvic or inguinal lymph nodes.  Reproductive: Prostate gland and seminal vesicles are unremarkable.  Other: Moderate ascites noted within the abdomen or pelvis.  Musculoskeletal: Review of the visualized osseous structures is negative for aggressive lytic or sclerotic bone lesion.  IMPRESSION: 1. Morphologic features of liver compatible with cirrhosis. 2. Portal venous hypertension with splenomegaly, ascites and large left-sided splenorenal shunt. 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 3. Gallstones. 4. Nonobstructing left renal calculus.   Electronically Signed   By: Kerby Moors M.D.   On: 01/15/2015 17:20    Scheduled Meds: . ciprofloxacin  400 mg Intravenous Q12H  . furosemide  40 mg Intravenous BID  . glipiZIDE  10 mg Oral QAC breakfast  . insulin aspart  0-20 Units Subcutaneous TID WC  . insulin aspart  0-5 Units Subcutaneous QHS  . insulin glargine  10 Units Subcutaneous QHS  . lactulose  10 g Oral TID  . pantoprazole  40 mg Oral Daily  . rifaximin  550 mg Oral BID  . spironolactone  25 mg Oral Once per day on Mon Wed Fri   Continuous Infusions:   Principal Problem:   Right upper quadrant abdominal pain Active Problems:   Iron (Fe) deficiency  anemia   Diabetes   Chronic hepatitis C with cirrhosis   Liver failure   Ascites   Liver failure, acute   Scrotal edema   Portal venous hypertension    Time spent: 40 minutes    [facility] Pager [phone]. If 7PM-7AM, please contact night-coverage at [email or web address], password [facility] 01/16/2015, 1:34 PM  LOS: 1 day

## 2015-01-16 NOTE — Consult Note (Signed)
Reason for Consult: Cholelithiasis Referring Physician: Hospitalist  Yanky EBER FERRUFINO is an 54 y.o. male.  HPI: Patient is a 54 year old white male with a long-standing history of alcoholic and hepatitis C cirrhosis who presents with increasing swelling of his scrotum and penis. He also states he has had a several-day history of worsening right flank pain. He denies any nausea or vomiting. No fevers have been noted. A CT scan of the abdomen was remarkable for his cirrhotic liver as well as a known gallstone. He apparently saw a Psychologist, sport and exercise in [place] last week who told him that he was not a candidate for cystectomy. He was told this 2 years ago by a Psychologist, sport and exercise in [place]. He currently is eating and denies any right upper quadrant abdominal pain.  Past Medical History  Diagnosis Date  . Hypertension   . Diabetes mellitus   . GERD (gastroesophageal reflux disease)     DEC 2010 EGD/Bx REACTIVE GASTROPATHY, NO VARICES  . Hemorrhoids, internal   . BMI 40.0-44.9, adult OCT 2010 269 LBS    APR 2012 279 LBS AUG 2014 185 LBS  . 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 IV drug abuse REMOTE  . Hepatitis 2010 HEP C    AST 509 ALT 267 ALK PHOS 165 ALB 3.8 NEG IGM HAV/HBSAg  . Gallstone AUG 2012 1 CM  . GERD 10/25/2009  . COPD (chronic obstructive pulmonary disease)   . Hepatitis C   . Pancytopenia 2013  . Splenomegaly 2013  . Other pancytopenia 02/18/2013  . Iron (Fe) deficiency anemia 02/18/2013  . Splenomegaly 02/18/2013  . Neuropathy   . Biliary dyskinesia JUL 2015    HIDA GB EF 5%  . Chronic pain   . Chronic leg pain     left    Past Surgical History  Procedure Laterality Date  . Sigmoidoscopy      2001 DR. FLEISCHMAN INTERNAL HERMORRHOIDS  . Upper gastrointestinal endoscopy  DEC 2010    BENIGN POLYPS, GASTRITIS, ?phg  . Knee surgery  RIGHT  . Hemorrhoid surgery    . Esophageal biopsy  09/08/2011    [name]:Moderate gastritis/Polyps, multiple in the body of the stomach   . Colonoscopy with propofol N/A 11/15/2013    [name]: rectal varices, small AVMs  . Esophagogastroduodenoscopy (egd) with propofol N/A 11/15/2013    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 Next EGD in April 2016.      Family History  Problem Relation Age of Onset  . Colon cancer Neg Hx   . Anesthesia problems Neg Hx   . Hypotension Neg Hx   . Malignant hyperthermia Neg Hx   . Pseudochol deficiency Neg Hx   . Cancer Father     Social History:  reports that he has never smoked. He has never used smokeless tobacco. He reports that he does not drink alcohol or use illicit drugs.  Allergies:  Allergies  Allergen Reactions  . Penicillins Hives    Medications: I have reviewed the patient's current medications.  Results for orders placed or performed during the hospital encounter of 01/15/15 (from the past 48 hour(s))  POC CBG, ED     Status: Abnormal   Collection Time: 01/15/15  2:26 PM  Result Value Ref Range   Glucose-Capillary 194 (H) 65 - 99 mg/dL  CBC with Differential     Status: Abnormal   Collection Time: 01/15/15  2:30 PM  Result Value Ref Range   WBC 6.0 4.0 - 10.5 K/uL   RBC 4.08 (L) 4.22 - 5.81 MIL/uL   Hemoglobin 12.9 (L) 13.0 - 17.0 g/dL   HCT 39.6 39.0 - 52.0 %   MCV 97.1 78.0 - 100.0 fL   MCH 31.6 26.0 - 34.0 pg   MCHC 32.6 30.0 - 36.0 g/dL   RDW 15.5 11.5 - 15.5 %   Platelets 61 (L) 150 - 400 K/uL    Comment: CONSISTENT WITH PREVIOUS RESULT SPECIMEN CHECKED FOR CLOTS    Neutrophils Relative % 69 43 - 77 %   Neutro Abs 4.1 1.7 - 7.7 K/uL   Lymphocytes Relative 19 12 - 46 %   Lymphs Abs 1.1 0.7 - 4.0 K/uL   Monocytes Relative 11 3 - 12 %   Monocytes Absolute 0.7 0.1 - 1.0 K/uL   Eosinophils Relative 1 0 - 5 %   Eosinophils Absolute 0.1 0.0 - 0.7 K/uL   Basophils Relative 0 0 - 1 %   Basophils Absolute 0.0 0.0 - 0.1 K/uL  Basic metabolic panel     Status: Abnormal   Collection Time:  01/15/15  2:30 PM  Result Value Ref Range   Sodium 134 (L) 135 - 145 mmol/L   Potassium 4.5 3.5 - 5.1 mmol/L   Chloride 107 101 - 111 mmol/L   CO2 24 22 - 32 mmol/L   Glucose, Bld 228 (H) 65 - 99 mg/dL   BUN 14 6 - 20 mg/dL   Creatinine, Ser 0.75 0.61 - 1.24 mg/dL   Calcium 8.0 (L) 8.9 - 10.3 mg/dL   GFR calc non Af Amer >60 >60 mL/min   GFR calc Af Amer >60 >60 mL/min    Comment: (NOTE) The eGFR has been calculated using the CKD EPI equation. This calculation has not been validated in all clinical situations. eGFR's persistently <60 mL/min signify possible Chronic Kidney Disease.    Anion gap 3 (L) 5 - 15  Hepatic function panel     Status: Abnormal   Collection Time: 01/15/15  2:30 PM  Result Value Ref Range   Total Protein 6.8 6.5 - 8.1 g/dL   Albumin 2.9 (L) 3.5 - 5.0 g/dL   AST 595 (H) 15 - 41 U/L   ALT 324 (H) 17 - 63 U/L   Alkaline Phosphatase 151 (H) 38 - 126 U/L   Total Bilirubin 6.4 (H) 0.3 - 1.2 mg/dL   Bilirubin, Direct 3.6 (H) 0.1 - 0.5 mg/dL   Indirect Bilirubin 2.8 (H) 0.3 - 0.9 mg/dL  Protime-INR     Status: Abnormal   Collection Time: 01/15/15  2:30 PM  Result Value Ref Range   Prothrombin Time 15.8 (H) 11.6 - 15.2 seconds   INR 1.25 0.00 - 1.49  APTT     Status: None   Collection Time: 01/15/15  2:30 PM  Result Value Ref Range   aPTT 31 24 - 37 seconds  Urinalysis, Routine w reflex microscopic (not at [facility])     Status: Abnormal   Collection Time: 01/15/15  3:15 PM  Result Value Ref Range   Color, Urine AMBER (A) YELLOW    Comment: BIOCHEMICALS MAY BE AFFECTED BY COLOR   APPearance CLEAR CLEAR   Specific Gravity, Urine >1.030 (H) 1.005 - 1.030   pH 5.5 5.0 - 8.0   Glucose, UA 500 (A) NEGATIVE mg/dL   Hgb urine dipstick LARGE (A) NEGATIVE   Bilirubin Urine LARGE (A) NEGATIVE   Ketones, ur TRACE (  A) NEGATIVE mg/dL   Protein, ur 100 (A) NEGATIVE mg/dL   Urobilinogen, UA 1.0 0.0 - 1.0 mg/dL   Nitrite NEGATIVE NEGATIVE   Leukocytes, UA NEGATIVE  NEGATIVE  Urine microscopic-add on     Status: Abnormal   Collection Time: 01/15/15  3:15 PM  Result Value Ref Range   Squamous Epithelial / LPF RARE RARE   WBC, UA 0-2 <3 WBC/hpf   RBC / HPF 11-20 <3 RBC/hpf   Bacteria, UA FEW (A) RARE   Urine-Other MUCOUS PRESENT   Ammonia     Status: Abnormal   Collection Time: 01/15/15  3:16 PM  Result Value Ref Range   Ammonia 84 (H) 9 - 35 umol/L  I-Stat CG4 Lactic Acid, ED     Status: None   Collection Time: 01/15/15  3:25 PM  Result Value Ref Range   Lactic Acid, Venous 1.33 0.5 - 2.0 mmol/L  I-Stat CG4 Lactic Acid, ED     Status: None   Collection Time: 01/15/15  6:10 PM  Result Value Ref Range   Lactic Acid, Venous 1.07 0.5 - 2.0 mmol/L  Glucose, capillary     Status: Abnormal   Collection Time: 01/15/15  8:41 PM  Result Value Ref Range   Glucose-Capillary 153 (H) 65 - 99 mg/dL   Comment 1 Notify RN    Comment 2 Document in Chart   Protime-INR     Status: Abnormal   Collection Time: 01/16/15  5:57 AM  Result Value Ref Range   Prothrombin Time 16.5 (H) 11.6 - 15.2 seconds   INR 1.32 0.00 - 1.49  Comprehensive metabolic panel     Status: Abnormal   Collection Time: 01/16/15  5:57 AM  Result Value Ref Range   Sodium 134 (L) 135 - 145 mmol/L   Potassium 4.0 3.5 - 5.1 mmol/L   Chloride 102 101 - 111 mmol/L   CO2 26 22 - 32 mmol/L   Glucose, Bld 148 (H) 65 - 99 mg/dL   BUN 16 6 - 20 mg/dL   Creatinine, Ser 0.84 0.61 - 1.24 mg/dL   Calcium 7.9 (L) 8.9 - 10.3 mg/dL   Total Protein 6.2 (L) 6.5 - 8.1 g/dL   Albumin 2.5 (L) 3.5 - 5.0 g/dL   AST 464 (H) 15 - 41 U/L   ALT 262 (H) 17 - 63 U/L   Alkaline Phosphatase 129 (H) 38 - 126 U/L   Total Bilirubin 7.5 (H) 0.3 - 1.2 mg/dL   GFR calc non Af Amer >60 >60 mL/min   GFR calc Af Amer >60 >60 mL/min    Comment: (NOTE) The eGFR has been calculated using the CKD EPI equation. This calculation has not been validated in all clinical situations. eGFR's persistently <60 mL/min signify  possible Chronic Kidney Disease.    Anion gap 6 5 - 15  CBC     Status: Abnormal   Collection Time: 01/16/15  5:57 AM  Result Value Ref Range   WBC 4.0 4.0 - 10.5 K/uL   RBC 3.73 (L) 4.22 - 5.81 MIL/uL   Hemoglobin 12.2 (L) 13.0 - 17.0 g/dL   HCT 36.1 (L) 39.0 - 52.0 %   MCV 96.8 78.0 - 100.0 fL   MCH 32.7 26.0 - 34.0 pg   MCHC 33.8 30.0 - 36.0 g/dL   RDW 14.9 11.5 - 15.5 %   Platelets 50 (L) 150 - 400 K/uL    Comment: SPECIMEN CHECKED FOR CLOTS CONSISTENT WITH PREVIOUS RESULT   Glucose, capillary  Status: Abnormal   Collection Time: 01/16/15  7:31 AM  Result Value Ref Range   Glucose-Capillary 129 (H) 65 - 99 mg/dL    Ct Abdomen Pelvis W Contrast  01/15/2015   CLINICAL DATA:  Right lower quadrant pain for 2 days  EXAM: CT ABDOMEN AND PELVIS WITH CONTRAST  TECHNIQUE: Multidetector CT imaging of the abdomen and pelvis was performed using the standard protocol following bolus administration of intravenous contrast.  CONTRAST:  168mL OMNIPAQUE IOHEXOL 300 MG/ML  SOLN  COMPARISON:  04/11/2014  FINDINGS: Lower chest: Changes of centrilobular and paraseptal emphysema noted. Calcified granulomas identified in the right middle lobe. No pleural effusion.  Hepatobiliary: The liver is cirrhotic. No focal liver abnormality noted. Gallstones identified. No biliary dilatation.  Pancreas: Unremarkable.  Spleen: The spleen measures 17 cm in length.  Adrenals/Urinary Tract: Normal appearance of the adrenal glands. The right kidney is normal. Stone within the inferior pole of the left kidney measures 7 mm. No hydronephrosis identified. The urinary bladder appears within normal limits.  Stomach/Bowel: Stomach is within normal limits. The small bowel loops are on unremarkable. The appendix is visualized and appears normal. Unremarkable appearance of the colon.  Vascular/Lymphatic: Normal appearance of the abdominal aorta. There is a large left-sided splenorenal shunt this extends into the left inguinal canal  and scrotum. No enlarged retroperitoneal or mesenteric adenopathy. No enlarged pelvic or inguinal lymph nodes.  Reproductive: Prostate gland and seminal vesicles are unremarkable.  Other: Moderate ascites noted within the abdomen or pelvis.  Musculoskeletal: Review of the visualized osseous structures is negative for aggressive lytic or sclerotic bone lesion.  IMPRESSION: 1. Morphologic features of liver compatible with cirrhosis. 2. Portal venous hypertension with splenomegaly, ascites and large left-sided splenorenal shunt. 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 3. Gallstones. 4. Nonobstructing left renal calculus.   Electronically Signed   By: Kerby Moors M.D.   On: 01/15/2015 17:20    ROS: See chart Blood pressure 150/68, pulse 66, temperature 97.8 F (36.6 C), temperature source Oral, resp. rate 22, height $RemoveBe'5\' 6"'dYenoShnK$  (1.676 m), weight 133.176 kg (293 lb 9.6 oz), SpO2 100 %. Physical Exam: Obese white male in no acute distress. Abdomen is rotund and distended, but no specific right upper quadrant tenderness is noted. Is difficult to out. The liver edge due to body habitus. No rigidity is noted.  Assessment/Plan: Impression: Known cholelithiasis with worsening liver failure. He is not a surgical candidate at this time. Plan: If cholecystitis is found, a cholecystostomy tube would be indicated. I do not think he has acute cholecystitis at this time. Will defer to GI for further management and care. Please call me if I can be of further assistance.  Edwyn Inclan A 01/16/2015, 9:06 AM

## 2015-01-16 NOTE — Care Management Note (Signed)
Case Management Note  Patient Details  Name: ODIES DESA MRN: [id number] Date of Birth: 1960-09-28  Expected Discharge Date:  01/19/15               Expected Discharge Plan:  Home/Self Care  In-House Referral:  NA  Discharge planning Services  CM Consult  Post Acute Care Choice:  NA Choice offered to:  NA  DME Arranged:    DME Agency:     HH Arranged:    [place] Agency:     Status of Service:  Completed, signed off  Medicare Important Message Given:    Date Medicare IM Given:    Medicare IM give by:    Date Additional Medicare IM Given:    Additional Medicare Important Message give by:     If discussed at [place] of Stay Meetings, dates discussed:    Additional Comments: Pt is from home, lives with wife and is independent at baseline. No HH services or DME's prior to admission. Pt plans to discharge home with self care. No CM needs anticipated.  Sherald Barge, RN 01/16/2015, 12:37 PM

## 2015-01-17 ENCOUNTER — Inpatient Hospital Stay (HOSPITAL_COMMUNITY): Payer: Medicare Other

## 2015-01-17 ENCOUNTER — Encounter (HOSPITAL_COMMUNITY): Payer: Self-pay

## 2015-01-17 DIAGNOSIS — R7989 Other specified abnormal findings of blood chemistry: Secondary | ICD-10-CM | POA: Insufficient documentation

## 2015-01-17 DIAGNOSIS — R945 Abnormal results of liver function studies: Secondary | ICD-10-CM | POA: Insufficient documentation

## 2015-01-17 LAB — COMPREHENSIVE METABOLIC PANEL
ALT: 287 U/L — ABNORMAL HIGH (ref 17–63)
ANION GAP: 7 (ref 5–15)
AST: 508 U/L — ABNORMAL HIGH (ref 15–41)
Albumin: 3 g/dL — ABNORMAL LOW (ref 3.5–5.0)
Alkaline Phosphatase: 146 U/L — ABNORMAL HIGH (ref 38–126)
BILIRUBIN TOTAL: 7.6 mg/dL — AB (ref 0.3–1.2)
BUN: 27 mg/dL — AB (ref 6–20)
CO2: 28 mmol/L (ref 22–32)
CREATININE: 1.01 mg/dL (ref 0.61–1.24)
Calcium: 8.7 mg/dL — ABNORMAL LOW (ref 8.9–10.3)
Chloride: 100 mmol/L — ABNORMAL LOW (ref 101–111)
GFR calc Af Amer: >60 mL/min (ref >60)
Glucose, Bld: 173 mg/dL — ABNORMAL HIGH (ref 65–99)
POTASSIUM: 4.3 mmol/L (ref 3.5–5.1)
SODIUM: 135 mmol/L (ref 135–145)
TOTAL PROTEIN: 7.2 g/dL (ref 6.5–8.1)

## 2015-01-17 LAB — CBC
HCT: 39.8 % (ref 39.0–52.0)
Hemoglobin: 13.3 g/dL (ref 13.0–17.0)
MCH: 32.4 pg (ref 26.0–34.0)
MCHC: 33.4 g/dL (ref 30.0–36.0)
MCV: 96.8 fL (ref 78.0–100.0)
PLATELETS: 69 10*3/uL — AB (ref 150–400)
RBC: 4.11 MIL/uL — ABNORMAL LOW (ref 4.22–5.81)
RDW: 15.2 % (ref 11.5–15.5)
WBC: 5 10*3/uL (ref 4.0–10.5)

## 2015-01-17 LAB — GLUCOSE, CAPILLARY
GLUCOSE-CAPILLARY: 161 mg/dL — AB (ref 65–99)
GLUCOSE-CAPILLARY: 148 mg/dL — AB (ref 65–99)
Glucose-Capillary: 142 mg/dL — ABNORMAL HIGH (ref 65–99)
Glucose-Capillary: 124 mg/dL — ABNORMAL HIGH (ref 65–99)

## 2015-01-17 LAB — HEMOGLOBIN A1C
Hgb A1c MFr Bld: 6.4 % — ABNORMAL HIGH (ref 4.8–5.6)
MEAN PLASMA GLUCOSE: 137 mg/dL

## 2015-01-17 LAB — HEPATITIS B SURFACE ANTIGEN: HEP B S AG: NEGATIVE

## 2015-01-17 MED ORDER — LACTULOSE 10 GM/15ML PO SOLN
10.0000 g | Freq: Three times a day (TID) | ORAL | Status: DC
  Administered 2015-01-17 – 2015-01-18 (×3): 10 g via ORAL
  Filled 2015-01-17 (×3): qty 30

## 2015-01-17 MED ORDER — LACTULOSE 10 GM/15ML PO SOLN
10.0000 g | Freq: Three times a day (TID) | ORAL | Status: DC
  Administered 2015-01-17: 10 g via ORAL
  Filled 2015-01-17: qty 30

## 2015-01-17 MED ORDER — TECHNETIUM TC 99M MEBROFENIN IV KIT
5.0000 | PACK | Freq: Once | INTRAVENOUS | Status: AC | PRN
  Administered 2015-01-17: 5.5 via INTRAVENOUS

## 2015-01-17 MED ORDER — TRAMADOL HCL 50 MG PO TABS
50.0000 mg | ORAL_TABLET | Freq: Once | ORAL | Status: AC
  Administered 2015-01-17: 50 mg via ORAL
  Filled 2015-01-17: qty 1

## 2015-01-17 MED ORDER — CLOTRIMAZOLE 1 % EX CREA
1.0000 "application " | TOPICAL_CREAM | Freq: Two times a day (BID) | CUTANEOUS | Status: DC
  Administered 2015-01-17 – 2015-01-18 (×2): 1 via TOPICAL
  Filled 2015-01-17: qty 15

## 2015-01-17 MED ORDER — SODIUM CHLORIDE 0.9 % IV SOLN
INTRAVENOUS | Status: AC
  Filled 2015-01-17: qty 250

## 2015-01-17 NOTE — Progress Notes (Signed)
[facility] PROGRESS NOTE  [name] ION:[id number] DOB: 1961/06/11 DOA: 01/15/2015 PCP: [name], MD  Assessment/Plan: 1. Decompensated liver disease. Liver transaminases trending up slighlty. Total bilirubin 7.6.  INR 1.3 yesterday. Hepatitis panel in process.  Evaluated by GI who recommend MRCP to evaluate for microlithiasis. Will continue intravenous Lasix and Lactulose per GI. Continue spironolactone, xifaxan. Recheck LFT in am. Appreciate gi assistance.  2. Diabetes. A1c 6.4. CBG range 142-161. Continue with home medications of lantus and glipizide. and sliding scale of insulin. 3. Gallstones with possible cholecystitis. Ciprofloxacin discontinued 01/16/15. Evaluated by general surgery who opined not likley acute cholecystitis at this time and not a surgical candidate. Defer to GI for management and care. See #1 he has worsening abdominal pain with persistent nausea vomiting.  4. Ascites: per CT related to #1.  5. Scrotal edema: related to severe portal hypertension related to #1. Improving. Continue lasix 6. Anemia: hx IDA in setting of chronic disease. Currently stable. Monitor 7. Thrombocytopenia: chronic related to #1. Current level at baseline. No evidence of overt bleeding. monitor   Code Status: full Family Communication: none present Disposition Plan: home when ready   Consultants:  Gi  General surgery  Procedures:  none  Antibiotics:  cipro 01/15/15 -01/16/15  HPI/Subjective: Sitting up in chair. Reports emesis 3am with worsening RUQ pain. Improved after IV pain med.   Objective: Filed Vitals:   01/17/15 0520  BP: 147/67  Pulse: 79  Temp: 98.4 F (36.9 C)  Resp: 18    Intake/Output Summary (Last 24 hours) at 01/17/15 1128 Last data filed at 01/17/15 1013  Gross per 24 hour  Intake   1640 ml  Output   1775 ml  Net   -135 ml   Filed Weights   01/15/15 1237 01/15/15 2018  Weight: 135.172 kg (298 lb) 133.176 kg (293 lb 9.6 oz)     Exam:   General:  Obese appears ill but not toxi  Cardiovascular: RRR no MGR no LE edema  Respiratory: normal effort BS clear bilaterally  Abdomen: mild distention but soft +BS mild tenderness RUQ no rebounding  Musculoskeletal: no clubbing or cyanosis   Data Reviewed: Basic Metabolic Panel:  Recent Labs Lab 01/15/15 1430 01/16/15 0557 01/17/15 0600  NA 134* 134* 135  K 4.5 4.0 4.3  CL 107 102 100*  CO2 24 26 28   GLUCOSE 228* 148* 173*  BUN 14 16 27*  CREATININE 0.75 0.84 1.01  CALCIUM 8.0* 7.9* 8.7*   Liver Function Tests:  Recent Labs Lab 01/15/15 1430 01/16/15 0557 01/17/15 0600  AST 595* 464* 508*  ALT 324* 262* 287*  ALKPHOS 151* 129* 146*  BILITOT 6.4* 7.5* 7.6*  PROT 6.8 6.2* 7.2  ALBUMIN 2.9* 2.5* 3.0*   No results for input(s): LIPASE, AMYLASE in the last 168 hours.  Recent Labs Lab 01/15/15 1516  AMMONIA 84*   CBC:  Recent Labs Lab 01/15/15 1430 01/16/15 0557 01/17/15 0600  WBC 6.0 4.0 5.0  NEUTROABS 4.1  --   --   HGB 12.9* 12.2* 13.3  HCT 39.6 36.1* 39.8  MCV 97.1 96.8 96.8  PLT 61* 50* 69*   Cardiac Enzymes: No results for input(s): CKTOTAL, CKMB, CKMBINDEX, TROPONINI in the last 168 hours. BNP (last 3 results) No results for input(s): BNP in the last 8760 hours.  ProBNP (last 3 results)  Recent Labs  07/31/14 1220  PROBNP 582.6*    CBG:  Recent Labs Lab 01/16/15 1113 01/16/15 1611 01/16/15 2116 01/17/15  6659 01/17/15 1122  GLUCAP 123* 147* 137* 142* 161*    No results found for this or any previous visit (from the past 240 hour(s)).   Studies: Ct Abdomen Pelvis W Contrast  01/15/2015   CLINICAL DATA:  Right lower quadrant pain for 2 days  EXAM: CT ABDOMEN AND PELVIS WITH CONTRAST  TECHNIQUE: Multidetector CT imaging of the abdomen and pelvis was performed using the standard protocol following bolus administration of intravenous contrast.  CONTRAST:  132mL OMNIPAQUE IOHEXOL 300 MG/ML  SOLN  COMPARISON:   04/11/2014  FINDINGS: Lower chest: Changes of centrilobular and paraseptal emphysema noted. Calcified granulomas identified in the right middle lobe. No pleural effusion.  Hepatobiliary: The liver is cirrhotic. No focal liver abnormality noted. Gallstones identified. No biliary dilatation.  Pancreas: Unremarkable.  Spleen: The spleen measures 17 cm in length.  Adrenals/Urinary Tract: Normal appearance of the adrenal glands. The right kidney is normal. Stone within the inferior pole of the left kidney measures 7 mm. No hydronephrosis identified. The urinary bladder appears within normal limits.  Stomach/Bowel: Stomach is within normal limits. The small bowel loops are on unremarkable. The appendix is visualized and appears normal. Unremarkable appearance of the colon.  Vascular/Lymphatic: Normal appearance of the abdominal aorta. There is a large left-sided splenorenal shunt this extends into the left inguinal canal and scrotum. No enlarged retroperitoneal or mesenteric adenopathy. No enlarged pelvic or inguinal lymph nodes.  Reproductive: Prostate gland and seminal vesicles are unremarkable.  Other: Moderate ascites noted within the abdomen or pelvis.  Musculoskeletal: Review of the visualized osseous structures is negative for aggressive lytic or sclerotic bone lesion.  IMPRESSION: 1. Morphologic features of liver compatible with cirrhosis. 2. Portal venous hypertension with splenomegaly, ascites and large left-sided splenorenal shunt. 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 3. Gallstones. 4. Nonobstructing left renal calculus.   Electronically Signed   By: Kerby Moors M.D.   On: 01/15/2015 17:20    Scheduled Meds: . furosemide  40 mg Intravenous BID  . glipiZIDE  10 mg Oral QAC breakfast  . insulin aspart  0-20 Units Subcutaneous TID WC  . insulin aspart  0-5 Units Subcutaneous QHS  . insulin glargine  10 Units Subcutaneous QHS  . lactulose  10 g Oral TID  . pantoprazole  40 mg  Oral BID AC  . rifaximin  550 mg Oral BID  . spironolactone  25 mg Oral Once per day on Mon Wed Fri   Continuous Infusions:   Principal Problem:   Right upper quadrant abdominal pain Active Problems:   Iron (Fe) deficiency anemia   Diabetes   Chronic hepatitis C with cirrhosis   Liver failure   Ascites   Liver failure, acute   Scrotal edema   Portal venous hypertension   Abnormal LFTs    Time spent: [facility] Pager [phone]. If 7PM-7AM, please contact night-coverage at [email or web address], password [facility] 01/17/2015, 11:28 AM  LOS: 2 days

## 2015-01-17 NOTE — Progress Notes (Addendum)
Pt is complaining of a lot of pain in his abdomen and has PRN oxycodone Q6. I gave pt pain med at [place] and he is grimacing complaining that he is still in pain. Paged Dr. Maudie Mercury who gave an order for 50 mg of Tramadol PO one time. Will administer and continue to monitor

## 2015-01-17 NOTE — Progress Notes (Signed)
Subjective:  Still with RUQ pain, acute on chronic. Vomiting during the night, last time per patient around 3am. No nausea right now, ate breakfast. States eating does not make his pain worse. Reports pain is currently not manageable with oral medications. Usually does well with oxycodone at home. Dilaudid single dose given last night helped a lot. No BM since 01/15/15. +flatus. Reports privates are less swollen.  Objective: Vital signs in last 24 hours: Temp:  [97.6 F (36.4 C)-98.4 F (36.9 C)] 98.4 F (36.9 C) (06/08 0520) Pulse Rate:  [61-79] 79 (06/08 0520) Resp:  [18-20] 18 (06/08 0520) BP: (115-153)/(52-73) 147/67 mmHg (06/08 0520) SpO2:  [100 %] 100 % (06/08 0520) Last BM Date: 01/15/15 General:   Alert,  Well-developed, well-nourished, pleasant and cooperative in NAD Head:  Normocephalic and atraumatic. Eyes:  Sclera clear, no icterus.  Chest: CTA bilaterally without rales, rhonchi, crackles.    Heart:  Regular rate and rhythm; no murmurs, clicks, rubs,  or gallops. Abdomen:  Soft, moderate ruq tenderness and protruberant. No masses, hepatosplenomegaly or hernias noted. Normal bowel sounds, without guarding, and without rebound.   Extremities:  Without clubbing, deformity. 1+ edema. Neurologic:  Alert and  oriented x4;  grossly normal neurologically. Skin:  Intact without significant lesions or rashes. Psych:  Alert and cooperative. Normal mood and affect.  Intake/Output from previous day: 06/07 0701 - 06/08 0700 In: 1400 [P.O.:1200; IV Piggyback:200] Out: 1750 [Urine:1750] Intake/Output this shift:    Lab Results: CBC  Recent Labs  01/15/15 1430 01/16/15 0557 01/17/15 0600  WBC 6.0 4.0 5.0  HGB 12.9* 12.2* 13.3  HCT 39.6 36.1* 39.8  MCV 97.1 96.8 96.8  PLT 61* 50* 69*   BMET  Recent Labs  01/15/15 1430 01/16/15 0557  NA 134* 134*  K 4.5 4.0  CL 107 102  CO2 24 26  GLUCOSE 228* 148*  BUN 14 16  CREATININE 0.75 0.84  CALCIUM 8.0* 7.9*    LFTs  Recent Labs  01/15/15 1430 01/16/15 0557 01/17/15 0600  BILITOT 6.4* 7.5* 7.6*  BILIDIR 3.6*  --   --   IBILI 2.8*  --   --   ALKPHOS 151* 129* 146*  AST 595* 464* 508*  ALT 324* 262* 287*  PROT 6.8 6.2* 7.2  ALBUMIN 2.9* 2.5* 3.0*   No results for input(s): LIPASE in the last 72 hours. PT/INR  Recent Labs  01/15/15 1430 01/16/15 0557  LABPROT 15.8* 16.5*  INR 1.25 1.32   Ethanol <5 Acetaminophen 12 Hep B surface Ag negative UDS: +opiates  Imaging Studies: Ct Angio Chest Pe W/cm &/or Wo Cm  12/21/2014   CLINICAL DATA:  54 year old male with 2 day history of shortness of breath an rib cramping.  EXAM: CT ANGIOGRAPHY CHEST WITH CONTRAST  TECHNIQUE: Multidetector CT imaging of the chest was performed using the standard protocol during bolus administration of intravenous contrast. Multiplanar CT image reconstructions and MIPs were obtained to evaluate the vascular anatomy.  CONTRAST:  157mL OMNIPAQUE IOHEXOL 350 MG/ML SOLN  COMPARISON:  Prior chest x-ray 07/31/2014; prior CT chest 01/16/2014  FINDINGS: Mediastinum: Unremarkable CT appearance of the thyroid gland. No suspicious mediastinal or hilar adenopathy. No soft tissue mediastinal mass. The thoracic esophagus is unremarkable.  Heart/Vascular: Sub optimal opacification of the vascular structures. The attenuation the main pulmonary arteries is less than 200 Hounsfield units. There is no evidence of central pulmonary embolus. Evaluation beyond the proximal lobar arteries is limited by poor opacification and respiratory motion. No aortic aneurysm  or dissection. Stable cardiomegaly. No pericardial effusion.  Lungs/Pleura: Mild centrilobular emphysema localized to the right middle lobe. Diffuse mild bronchial wall thickening. Otherwise, the lungs are clear. No pleural effusion.  Bones/Soft Tissues: No acute fracture or aggressive appearing lytic or blastic osseous lesion.  Upper Abdomen: Cirrhotic liver morphology. Splenomegaly  consistent with underlying portal hypertension.  Review of the MIP images confirms the above findings.  IMPRESSION: 1. Within the limitations described above, no evidence of central pulmonary embolus. 2. No evidence of pneumonia or other acute cardiopulmonary process. 3. Stable cardiomegaly. 4. Diffuse mild bronchial wall thickening again noted and suggestive of chronic bronchitis. 5. Cirrhosis and portal hypertension as evidenced by splenomegaly.   Electronically Signed   By: Jacqulynn Cadet M.D.   On: 12/21/2014 15:45   Ct Abdomen Pelvis W Contrast  01/15/2015   CLINICAL DATA:  Right lower quadrant pain for 2 days  EXAM: CT ABDOMEN AND PELVIS WITH CONTRAST  TECHNIQUE: Multidetector CT imaging of the abdomen and pelvis was performed using the standard protocol following bolus administration of intravenous contrast.  CONTRAST:  1104mL OMNIPAQUE IOHEXOL 300 MG/ML  SOLN  COMPARISON:  04/11/2014  FINDINGS: Lower chest: Changes of centrilobular and paraseptal emphysema noted. Calcified granulomas identified in the right middle lobe. No pleural effusion.  Hepatobiliary: The liver is cirrhotic. No focal liver abnormality noted. Gallstones identified. No biliary dilatation.  Pancreas: Unremarkable.  Spleen: The spleen measures 17 cm in length.  Adrenals/Urinary Tract: Normal appearance of the adrenal glands. The right kidney is normal. Stone within the inferior pole of the left kidney measures 7 mm. No hydronephrosis identified. The urinary bladder appears within normal limits.  Stomach/Bowel: Stomach is within normal limits. The small bowel loops are on unremarkable. The appendix is visualized and appears normal. Unremarkable appearance of the colon.  Vascular/Lymphatic: Normal appearance of the abdominal aorta. There is a large left-sided splenorenal shunt this extends into the left inguinal canal and scrotum. No enlarged retroperitoneal or mesenteric adenopathy. No enlarged pelvic or inguinal lymph nodes.   Reproductive: Prostate gland and seminal vesicles are unremarkable.  Other: Moderate ascites noted within the abdomen or pelvis.  Musculoskeletal: Review of the visualized osseous structures is negative for aggressive lytic or sclerotic bone lesion.  IMPRESSION: 1. Morphologic features of liver compatible with cirrhosis. 2. Portal venous hypertension with splenomegaly, ascites and large left-sided splenorenal shunt. 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 3. Gallstones. 4. Nonobstructing left renal calculus.   Electronically Signed   By: Kerby Moors M.D.   On: 01/15/2015 17:20   US Abdomen Limited Ruq  12/28/2014   CLINICAL DATA:  Chronic hepatitis-C.  Cirrhosis.  EXAM: US ABDOMEN LIMITED - RIGHT UPPER QUADRANT  COMPARISON:  08/02/2014.  FINDINGS: Gallbladder:  Multiple gallstones noted. Gallbladder wall is thickened at 9 mm. Negative Murphy sign.  Common bile duct:  Diameter: 3.4 mm.  Liver:  Liver has a and nodular heterogeneous echotexture consistent with known cirrhosis. Small amount of ascites noted.  IMPRESSION: 1. Liver has a nodular heterogeneous echotexture consistent with known cirrhosis. 2. Mild ascites. 3. Multiple gallstones. Gallbladder wall is thickened to 9 mm. This could be from hypoproteinemic state and or cholecystitis.   Electronically Signed   By: Marcello Moores  Register   On: 12/28/2014 08:56  [2 weeks]   Assessment: 54 year old male with history of HCV/ETOH cirrhosis (MELD 17), presenting with acute on chronic abdominal pain, N/V. scrotal edema, and concern for cholecystitis. He is not felt to have acute cholecystitis  at this point, and surgical risk is high given comorbidities. If clinically needed, could consider cholecystostomy tube. As of note, Hep C not eradicated with recent treatments, finishing March 2016; he is scheduled to see the Hepatology Clinic in [place] later this month. Continues to deny drug and ETOH use; however, he has tested positive for cocaine  as of Dec 2015. Decompensated liver disease, LFTs fluctuating with no significant trends. CT without evidence for biliary dilation. Could consider MRCP if concerns for microlithiasis.  Plan: Lactulose TID. Continue Xifaxan BID. Repeat LFTs/INR tomorrow. MRCP to evaluate for microlithiasis, unexplained change in LFTs.   Laureen Ochs. Bernarda Caffey [facility] [phone] 6/8/20169:33 AM     LOS: 2 days

## 2015-01-18 DIAGNOSIS — B182 Chronic viral hepatitis C: Secondary | ICD-10-CM

## 2015-01-18 DIAGNOSIS — R7989 Other specified abnormal findings of blood chemistry: Secondary | ICD-10-CM

## 2015-01-18 DIAGNOSIS — R1011 Right upper quadrant pain: Secondary | ICD-10-CM

## 2015-01-18 DIAGNOSIS — R188 Other ascites: Secondary | ICD-10-CM

## 2015-01-18 LAB — COMPREHENSIVE METABOLIC PANEL
ALBUMIN: 2.5 g/dL — AB (ref 3.5–5.0)
ALT: 219 U/L — ABNORMAL HIGH (ref 17–63)
ANION GAP: 6 (ref 5–15)
AST: 412 U/L — ABNORMAL HIGH (ref 15–41)
Alkaline Phosphatase: 116 U/L (ref 38–126)
BUN: 31 mg/dL — ABNORMAL HIGH (ref 6–20)
CHLORIDE: 98 mmol/L — AB (ref 101–111)
CO2: 29 mmol/L (ref 22–32)
Calcium: 8.1 mg/dL — ABNORMAL LOW (ref 8.9–10.3)
Creatinine, Ser: 1.11 mg/dL (ref 0.61–1.24)
Glucose, Bld: 132 mg/dL — ABNORMAL HIGH (ref 65–99)
Potassium: 4 mmol/L (ref 3.5–5.1)
Sodium: 133 mmol/L — ABNORMAL LOW (ref 135–145)
TOTAL PROTEIN: 5.9 g/dL — AB (ref 6.5–8.1)
Total Bilirubin: 5.7 mg/dL — ABNORMAL HIGH (ref 0.3–1.2)

## 2015-01-18 LAB — GLUCOSE, CAPILLARY
Glucose-Capillary: 108 mg/dL — ABNORMAL HIGH (ref 65–99)
Glucose-Capillary: 173 mg/dL — ABNORMAL HIGH (ref 65–99)

## 2015-01-18 LAB — HEPATITIS B CORE ANTIBODY, IGM: HEP B C IGM: NEGATIVE

## 2015-01-18 LAB — HEPATITIS A ANTIBODY, IGM: HEP A IGM: NEGATIVE

## 2015-01-18 LAB — PROTIME-INR
INR: 1.25 (ref 0.00–1.49)
Prothrombin Time: 15.8 seconds — ABNORMAL HIGH (ref 11.6–15.2)

## 2015-01-18 MED ORDER — CLOTRIMAZOLE 1 % EX CREA: 1.0000 "application " | TOPICAL_CREAM | Freq: Two times a day (BID) | CUTANEOUS | Status: DC

## 2015-01-18 NOTE — Telephone Encounter (Signed)
OPV IN 3 MOS E30 HEP C CIRRHOSIS/CHILD PUGH C.

## 2015-01-18 NOTE — Progress Notes (Signed)
Subjective:  Feels 100% better. Abdominal cramping in the right upper quadrant resolved. Now at baseline chronic abdominal pain. No further vomiting. Good bowel movement this morning.  Objective: Vital signs in last 24 hours: Temp:  [97.8 F (36.6 C)-98.3 F (36.8 C)] 98.2 F (36.8 C) (06/09 0603) Pulse Rate:  [74-85] 78 (06/09 0603) Resp:  [20] 20 (06/09 0603) BP: (140-151)/(64-67) 151/66 mmHg (06/09 0603) SpO2:  [100 %] 100 % (06/09 0603) Weight:  [293 lb 1.6 oz (132.949 kg)] 293 lb 1.6 oz (132.949 kg) (06/09 0621) Last BM Date: 01/15/15 General:   Alert,  Well-developed, well-nourished, pleasant and cooperative in NAD Head:  Normocephalic and atraumatic. Eyes:  Sclera clear, no icterus.  Abdomen:  Soft, mild ruq tenderness and obese.  Normal bowel sounds, without guarding, and without rebound.   Extremities:  Without clubbing, deformity. 1+ pitting edema in the lower extremities bilaterally. Neurologic:  Alert and  oriented x4;  grossly normal neurologically. Skin:  Intact without significant lesions or rashes. Psych:  Alert and cooperative. Normal mood and affect.  Intake/Output from previous day: 06/08 0701 - 06/09 0700 In: 1200 [P.O.:1200] Out: 1075 [Urine:1075] Intake/Output this shift:    Lab Results: CBC  Recent Labs  01/15/15 1430 01/16/15 0557 01/17/15 0600  WBC 6.0 4.0 5.0  HGB 12.9* 12.2* 13.3  HCT 39.6 36.1* 39.8  MCV 97.1 96.8 96.8  PLT 61* 50* 69*   BMET  Recent Labs  01/16/15 0557 01/17/15 0600 01/18/15 0546  NA 134* 135 133*  K 4.0 4.3 4.0  CL 102 100* 98*  CO2 26 28 29   GLUCOSE 148* 173* 132*  BUN 16 27* 31*  CREATININE 0.84 1.01 1.11  CALCIUM 7.9* 8.7* 8.1*   LFTs  Recent Labs  01/15/15 1430 01/16/15 0557 01/17/15 0600 01/18/15 0546  BILITOT 6.4* 7.5* 7.6* 5.7*  BILIDIR 3.6*  --   --   --   IBILI 2.8*  --   --   --   ALKPHOS 151* 129* 146* 116  AST 595* 464* 508* 412*  ALT 324* 262* 287* 219*  PROT 6.8 6.2* 7.2 5.9*   ALBUMIN 2.9* 2.5* 3.0* 2.5*   No results for input(s): LIPASE in the last 72 hours. PT/INR  Recent Labs  01/15/15 1430 01/16/15 0557 01/18/15 0546  LABPROT 15.8* 16.5* 15.8*  INR 1.25 1.32 1.25   Hepatitis A IgM negative. Hepatitis B core antibody IgM negative.    Imaging Studies: Ct Angio Chest Pe W/cm &/or Wo Cm  12/21/2014   CLINICAL DATA:  54 year old male with 2 day history of shortness of breath an rib cramping.  EXAM: CT ANGIOGRAPHY CHEST WITH CONTRAST  TECHNIQUE: Multidetector CT imaging of the chest was performed using the standard protocol during bolus administration of intravenous contrast. Multiplanar CT image reconstructions and MIPs were obtained to evaluate the vascular anatomy.  CONTRAST:  166mL OMNIPAQUE IOHEXOL 350 MG/ML SOLN  COMPARISON:  Prior chest x-ray 07/31/2014; prior CT chest 01/16/2014  FINDINGS: Mediastinum: Unremarkable CT appearance of the thyroid gland. No suspicious mediastinal or hilar adenopathy. No soft tissue mediastinal mass. The thoracic esophagus is unremarkable.  Heart/Vascular: Sub optimal opacification of the vascular structures. The attenuation the main pulmonary arteries is less than 200 Hounsfield units. There is no evidence of central pulmonary embolus. Evaluation beyond the proximal lobar arteries is limited by poor opacification and respiratory motion. No aortic aneurysm or dissection. Stable cardiomegaly. No pericardial effusion.  Lungs/Pleura: Mild centrilobular emphysema localized to the right middle lobe. Diffuse mild  bronchial wall thickening. Otherwise, the lungs are clear. No pleural effusion.  Bones/Soft Tissues: No acute fracture or aggressive appearing lytic or blastic osseous lesion.  Upper Abdomen: Cirrhotic liver morphology. Splenomegaly consistent with underlying portal hypertension.  Review of the MIP images confirms the above findings.  IMPRESSION: 1. Within the limitations described above, no evidence of central pulmonary embolus. 2.  No evidence of pneumonia or other acute cardiopulmonary process. 3. Stable cardiomegaly. 4. Diffuse mild bronchial wall thickening again noted and suggestive of chronic bronchitis. 5. Cirrhosis and portal hypertension as evidenced by splenomegaly.   Electronically Signed   By: Jacqulynn Cadet M.D.   On: 12/21/2014 15:45   Nm Hepatobiliary Including Gb  01/17/2015   CLINICAL DATA:  Abdominal pain cramping.  Abnormal CT.  EXAM: NUCLEAR MEDICINE HEPATOBILIARY IMAGING  TECHNIQUE: Sequential images of the abdomen were obtained out to 60 minutes following intravenous administration of radiopharmaceutical.  RADIOPHARMACEUTICALS:  5.5 Tc-29m Choletec IV  COMPARISON:  CT 01/15/2015, HIDA scan 10/03/2010  FINDINGS: There is some some delay in clearance of radiotracer from the blood pool which suggests elevated bilirubin competing with radiotracer. The gallbladder is evident by a 30 minutes. Counts flow into the small bowel by 25 minutes.  IMPRESSION: 1. Patent cystic duct and common bile duct. No evidence of cholecystitis. 2. Delayed clearance of radiotracer from the blood pool consistent with hyperbilirubinemia.   Electronically Signed   By: Suzy Bouchard M.D.   On: 01/17/2015 14:16   Ct Abdomen Pelvis W Contrast  01/15/2015   CLINICAL DATA:  Right lower quadrant pain for 2 days  EXAM: CT ABDOMEN AND PELVIS WITH CONTRAST  TECHNIQUE: Multidetector CT imaging of the abdomen and pelvis was performed using the standard protocol following bolus administration of intravenous contrast.  CONTRAST:  141mL OMNIPAQUE IOHEXOL 300 MG/ML  SOLN  COMPARISON:  04/11/2014  FINDINGS: Lower chest: Changes of centrilobular and paraseptal emphysema noted. Calcified granulomas identified in the right middle lobe. No pleural effusion.  Hepatobiliary: The liver is cirrhotic. No focal liver abnormality noted. Gallstones identified. No biliary dilatation.  Pancreas: Unremarkable.  Spleen: The spleen measures 17 cm in length.  Adrenals/Urinary  Tract: Normal appearance of the adrenal glands. The right kidney is normal. Stone within the inferior pole of the left kidney measures 7 mm. No hydronephrosis identified. The urinary bladder appears within normal limits.  Stomach/Bowel: Stomach is within normal limits. The small bowel loops are on unremarkable. The appendix is visualized and appears normal. Unremarkable appearance of the colon.  Vascular/Lymphatic: Normal appearance of the abdominal aorta. There is a large left-sided splenorenal shunt this extends into the left inguinal canal and scrotum. No enlarged retroperitoneal or mesenteric adenopathy. No enlarged pelvic or inguinal lymph nodes.  Reproductive: Prostate gland and seminal vesicles are unremarkable.  Other: Moderate ascites noted within the abdomen or pelvis.  Musculoskeletal: Review of the visualized osseous structures is negative for aggressive lytic or sclerotic bone lesion.  IMPRESSION: 1. Morphologic features of liver compatible with cirrhosis. 2. Portal venous hypertension with splenomegaly, ascites and large left-sided splenorenal shunt. 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 3. Gallstones. 4. Nonobstructing left renal calculus.   Electronically Signed   By: Kerby Moors M.D.   On: 01/15/2015 17:20   US Abdomen Limited Ruq  12/28/2014   CLINICAL DATA:  Chronic hepatitis-C.  Cirrhosis.  EXAM: US ABDOMEN LIMITED - RIGHT UPPER QUADRANT  COMPARISON:  08/02/2014.  FINDINGS: Gallbladder:  Multiple gallstones noted. Gallbladder wall is thickened at  9 mm. Negative Murphy sign.  Common bile duct:  Diameter: 3.4 mm.  Liver:  Liver has a and nodular heterogeneous echotexture consistent with known cirrhosis. Small amount of ascites noted.  IMPRESSION: 1. Liver has a nodular heterogeneous echotexture consistent with known cirrhosis. 2. Mild ascites. 3. Multiple gallstones. Gallbladder wall is thickened to 9 mm. This could be from hypoproteinemic state and or cholecystitis.    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 He is not felt to have acute cholecystitis at this point, and surgical risk is high given comorbidities. If clinically needed, could consider cholecystostomy tube. As of note, Hep C not eradicated with recent treatments, finishing March 2016; he is scheduled to see the Hepatology Clinic in [place] next week. Continues to deny drug and ETOH use; however, he has tested positive for cocaine as of Dec 2015.  HIDA yesterday since he was too big for the MRI showed no biliary obstruction.   Clinically patient feels better. His LFTs have improved. Question passage of sludge or stone.  Plan: 1. Continue lactulose and Xifaxan. 2. Consider resuming diuretic therapy as an outpatient.  3. 2 g sodium diet. 4. If discharged later today, recheck labs Monday including CMET, PT/INR.   Laureen Ochs. Bernarda Caffey [facility] [phone] 6/9/201611:52 AM     LOS: 3 days

## 2015-01-18 NOTE — Care Management Note (Signed)
Case Management Note  Patient Details  Name: [name] MRN: [id number] Date of Birth: [birth year]  Subjective/Objective:                    Action/Plan:   Expected Discharge Date:  01/19/15               Expected Discharge Plan:  Home/Self Care  In-House Referral:  NA  Discharge planning Services  CM Consult  Post Acute Care Choice:  NA Choice offered to:  NA  DME Arranged:    DME Agency:     HH Arranged:    HH Agency:     Status of Service:  Completed, signed off  Medicare Important Message Given:  Yes Date Medicare IM Given:  01/18/15 Medicare IM give by:  [name], RN BSN CM Date Additional Medicare IM Given:    Additional Medicare Important Message give by:     If discussed at [place] of Stay Meetings, dates discussed:    Additional Comments:  Joylene Draft, RN 01/18/2015, 3:15 PM

## 2015-01-18 NOTE — Care Management Note (Signed)
Case Management Note  Patient Details  Name: [name] MRN: [id number] Date of Birth: Mar 10, 1961  Subjective/Objective:                    Action/Plan:   Expected Discharge Date:  01/19/15               Expected Discharge Plan:  Home/Self Care  In-House Referral:  NA  Discharge planning Services  CM Consult  Post Acute Care Choice:  NA Choice offered to:  NA  DME Arranged:    DME Agency:     HH Arranged:    HH Agency:     Status of Service:  Completed, signed off  Medicare Important Message Given:  Yes Date Medicare IM Given:    Medicare IM give by:    Date Additional Medicare IM Given:    Additional Medicare Important Message give by:     If discussed at [place] of Stay Meetings, dates discussed:    Additional Comments: Pt discharged home today. No CM needs noted. [name] [place], RN 01/18/2015, 3:14 PM

## 2015-01-18 NOTE — Progress Notes (Signed)
Pt. And family received discharge and medication instructions. Pt. Stated he had no further questions. Pt. Informed of follow up appt. And encouraged to follow all discharge orders. Pt. Stable. Escorted via W/C to main entrance by Chartered certified accountant.

## 2015-01-18 NOTE — Discharge Summary (Signed)
Physician Discharge Summary  [name] [name]:[id number] DOB: 02-11-1961 DOA: 01/15/2015  PCP: [name], MD  Admit date: 01/15/2015 Discharge date: 01/18/2015  Time spent: 45 minutes  Recommendations for Outpatient Follow-up:  1. Follow up with PCP in one week. 2. Follow up with Dr Oneida Alar next week.  3. Keep your appointment with hepatologist in [place].   Discharge Diagnoses:  Principal Problem:   Right upper quadrant abdominal pain Active Problems:   Iron (Fe) deficiency anemia   Diabetes   Chronic hepatitis C with cirrhosis   Liver failure   Ascites   Liver failure, acute   Scrotal edema   Portal venous hypertension   Abnormal LFTs   Discharge Condition: much improved.   Diet recommendation: Low Na.  Filed Weights   01/15/15 1237 01/15/15 2018 01/18/15 0621  Weight: 135.172 kg (298 lb) 133.176 kg (293 lb 9.6 oz) 132.949 kg (293 lb 1.6 oz)    History of present illness: Patient was admitted on January 15, 2015 for right upper quadrant pain with left testicular swelling by Dr Delane Ginger. [place].  As per his H and P;  "This is a 54 on [name] is a history of cirrhosis of the liver primarily secondary to hepatitis C, now presents with a four-day history of right sided upper quadrant pain. He also has had swelling in his scrotum and penis for the last 2 days. He has gained over 15 pounds in weight. He notices that his urine is dark colored. He denies any confusion. Evaluation in the emergency room shows cirrhotic liver with ascites and left-sided spleen or renal shunt. There are gallstones present. He has not been febrile. He has not had any nausea or vomiting. There is no diarrhea. He is now being admitted for further management.  Hospital Course: Patient was admitted into the hospital and originally was thought that he had acute cholecystitis.  He has elevation of liver fx tests, but normal INR.  His Hepatitis A and B were negative, he has known Hep C. Surgery and GI reviewed the CT scan,  and did not feel that he had acute cholecystitis.  He maintained that he hadn't drank alcohol.  He was given pain meds, and did have improvement of his LFTs, along with his RUQ pain.  For his testicular swelling, diuresis was given, and he improved there as well.  He did have balanitis, and was given topical medicine.  Due to his large size, MRI was not able to be performed, and he was given a HIDA scan, as per recommnedation of GI. I wasn't sure if tylenol had played any part in his LFTs elevation.  In any event, his LFTs improved, and his pain abated.  He is anxious to go home, and is stable for discharge.  He has an appointment with his hepatologist and will see his PCP and Dr Oneida Alar next week.  It was recommended that his diuretics be held per Dr Oneida Alar.  Thank you for allowing me to participate in his care.   Procedures:  CT Abd/Pelvis scan and HIDA scan.  Consultations: GI and Surgery.   Discharge Exam: Filed Vitals:   01/18/15 0603  BP: 151/66  Pulse: 78  Temp: 98.2 F (36.8 C)  Resp: 20    General: Well.  Cardiovascular: S1S2 Respiratory: Clear.   Discharge Instructions   Discharge Instructions    Diet - low sodium heart healthy    Complete by:  As directed      Discharge instructions  Complete by:  As directed   Don't take tylenol. Follow up with PCP Monday for liver tests.  Follow up with Dr Eden Lathe next week also.     Increase activity slowly    Complete by:  As directed           Current Discharge Medication List    START taking these medications   Details  clotrimazole (LOTRIMIN) 1 % cream Apply 1 application topically 2 (two) times daily. Qty: 30 g, Refills: 0      CONTINUE these medications which have NOT CHANGED   Details  albuterol (PROVENTIL HFA;VENTOLIN HFA) 108 (90 BASE) MCG/ACT inhaler Inhale 2 puffs into the lungs every 6 (six) hours as needed. Shortness of breath Qty: 1 Inhaler, Refills: 1         glipiZIDE (GLUCOTROL) 10 MG tablet Take 1  tablet (10 mg total) by mouth daily. Qty: 30 tablet, Refills: 2    insulin glargine (LANTUS) 100 UNIT/ML injection Inject 10 Units into the skin at bedtime.    lactulose (CHRONULAC) 10 GM/15ML solution Take 15 mLs (10 g total) by mouth 3 (three) times daily. Qty: 473 mL, Refills: 1    omeprazole (PRILOSEC) 20 MG capsule Take 2 capsules (40 mg total) by mouth daily. Qty: 60 capsule, Refills: 2    Oxycodone HCl 10 MG TABS 1 tablet every 6 hours PRN Qty: 120 tablet, Refills: 0    rifaximin (XIFAXAN) 550 MG TABS tablet Take 1 tablet (550 mg total) by mouth 2 (two) times daily. Qty: 60 tablet, Refills: 5           STOP taking these medications     acetaminophen (TYLENOL) 500 MG tablet      cyclobenzaprine (FLEXERIL) 5 MG tablet      hydrOXYzine (ATARAX/VISTARIL) 10 MG tablet           Lasix and Aldactone.    Allergies  Allergen Reactions  . Penicillins Hives      The results of significant diagnostics from this hospitalization (including imaging, microbiology, ancillary and laboratory) are listed below for reference.    Significant Diagnostic Studies: Ct Angio Chest Pe W/cm &/or Wo Cm  12/21/2014   CLINICAL DATA:  54 year old male with 2 day history of shortness of breath an rib cramping.  EXAM: CT ANGIOGRAPHY CHEST WITH CONTRAST  TECHNIQUE: Multidetector CT imaging of the chest was performed using the standard protocol during bolus administration of intravenous contrast. Multiplanar CT image reconstructions and MIPs were obtained to evaluate the vascular anatomy.  CONTRAST:  177mL OMNIPAQUE IOHEXOL 350 MG/ML SOLN  COMPARISON:  Prior chest x-ray 07/31/2014; prior CT chest 01/16/2014  FINDINGS: Mediastinum: Unremarkable CT appearance of the thyroid gland. No suspicious mediastinal or hilar adenopathy. No soft tissue mediastinal mass. The thoracic esophagus is unremarkable.  Heart/Vascular: Sub optimal opacification of the vascular structures. The attenuation the main pulmonary  arteries is less than 200 Hounsfield units. There is no evidence of central pulmonary embolus. Evaluation beyond the proximal lobar arteries is limited by poor opacification and respiratory motion. No aortic aneurysm or dissection. Stable cardiomegaly. No pericardial effusion.  Lungs/Pleura: Mild centrilobular emphysema localized to the right middle lobe. Diffuse mild bronchial wall thickening. Otherwise, the lungs are clear. No pleural effusion.  Bones/Soft Tissues: No acute fracture or aggressive appearing lytic or blastic osseous lesion.  Upper Abdomen: Cirrhotic liver morphology. Splenomegaly consistent with underlying portal hypertension.  Review of the MIP images confirms the above findings.  IMPRESSION: 1. Within the limitations described above,  no evidence of central pulmonary embolus. 2. No evidence of pneumonia or other acute cardiopulmonary process. 3. Stable cardiomegaly. 4. Diffuse mild bronchial wall thickening again noted and suggestive of chronic bronchitis. 5. Cirrhosis and portal hypertension as evidenced by splenomegaly.   Electronically Signed   By: Jacqulynn Cadet M.D.   On: 12/21/2014 15:45   Nm Hepatobiliary Including Gb  01/17/2015   CLINICAL DATA:  Abdominal pain cramping.  Abnormal CT.  EXAM: NUCLEAR MEDICINE HEPATOBILIARY IMAGING  TECHNIQUE: Sequential images of the abdomen were obtained out to 60 minutes following intravenous administration of radiopharmaceutical.  RADIOPHARMACEUTICALS:  5.5 Tc-20m Choletec IV  COMPARISON:  CT 01/15/2015, HIDA scan 10/03/2010  FINDINGS: There is some some delay in clearance of radiotracer from the blood pool which suggests elevated bilirubin competing with radiotracer. The gallbladder is evident by a 30 minutes. Counts flow into the small bowel by 25 minutes.  IMPRESSION: 1. Patent cystic duct and common bile duct. No evidence of cholecystitis. 2. Delayed clearance of radiotracer from the blood pool consistent with hyperbilirubinemia.   Electronically  Signed   By: Suzy Bouchard M.D.   On: 01/17/2015 14:16   Ct Abdomen Pelvis W Contrast  01/15/2015   CLINICAL DATA:  Right lower quadrant pain for 2 days  EXAM: CT ABDOMEN AND PELVIS WITH CONTRAST  TECHNIQUE: Multidetector CT imaging of the abdomen and pelvis was performed using the standard protocol following bolus administration of intravenous contrast.  CONTRAST:  152mL OMNIPAQUE IOHEXOL 300 MG/ML  SOLN  COMPARISON:  04/11/2014  FINDINGS: Lower chest: Changes of centrilobular and paraseptal emphysema noted. Calcified granulomas identified in the right middle lobe. No pleural effusion.  Hepatobiliary: The liver is cirrhotic. No focal liver abnormality noted. Gallstones identified. No biliary dilatation.  Pancreas: Unremarkable.  Spleen: The spleen measures 17 cm in length.  Adrenals/Urinary Tract: Normal appearance of the adrenal glands. The right kidney is normal. Stone within the inferior pole of the left kidney measures 7 mm. No hydronephrosis identified. The urinary bladder appears within normal limits.  Stomach/Bowel: Stomach is within normal limits. The small bowel loops are on unremarkable. The appendix is visualized and appears normal. Unremarkable appearance of the colon.  Vascular/Lymphatic: Normal appearance of the abdominal aorta. There is a large left-sided splenorenal shunt this extends into the left inguinal canal and scrotum. No enlarged retroperitoneal or mesenteric adenopathy. No enlarged pelvic or inguinal lymph nodes.  Reproductive: Prostate gland and seminal vesicles are unremarkable.  Other: Moderate ascites noted within the abdomen or pelvis.  Musculoskeletal: Review of the visualized osseous structures is negative for aggressive lytic or sclerotic bone lesion.  IMPRESSION: 1. Morphologic features of liver compatible with cirrhosis. 2. Portal venous hypertension with splenomegaly, ascites and large left-sided splenorenal shunt. 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  3. Gallstones. 4. Nonobstructing left renal calculus.   Electronically Signed   By: Kerby Moors M.D.   On: 01/15/2015 17:20   US Abdomen Limited Ruq  12/28/2014   CLINICAL DATA:  Chronic hepatitis-C.  Cirrhosis.  EXAM: US ABDOMEN LIMITED - RIGHT UPPER QUADRANT  COMPARISON:  08/02/2014.  FINDINGS: Gallbladder:  Multiple gallstones noted. Gallbladder wall is thickened at 9 mm. Negative Murphy sign.  Common bile duct:  Diameter: 3.4 mm.  Liver:  Liver has a and nodular heterogeneous echotexture consistent with known cirrhosis. Small amount of ascites noted.  IMPRESSION: 1. Liver has a nodular heterogeneous echotexture consistent with known cirrhosis. 2. Mild ascites. 3. Multiple gallstones. Gallbladder wall is thickened  to 9 mm. This could be from hypoproteinemic state and or cholecystitis.   Electronically Signed   By: Marcello Moores  Register   On: 12/28/2014 08:56    Microbiology: No results found for this or any previous visit (from the past 240 hour(s)).   Labs: Basic Metabolic Panel:  Recent Labs Lab 01/15/15 1430 01/16/15 0557 01/17/15 0600 01/18/15 0546  NA 134* 134* 135 133*  K 4.5 4.0 4.3 4.0  CL 107 102 100* 98*  CO2 24 26 28 29   GLUCOSE 228* 148* 173* 132*  BUN 14 16 27* 31*  CREATININE 0.75 0.84 1.01 1.11  CALCIUM 8.0* 7.9* 8.7* 8.1*   Liver Function Tests:  Recent Labs Lab 01/15/15 1430 01/16/15 0557 01/17/15 0600 01/18/15 0546  AST 595* 464* 508* 412*  ALT 324* 262* 287* 219*  ALKPHOS 151* 129* 146* 116  BILITOT 6.4* 7.5* 7.6* 5.7*  PROT 6.8 6.2* 7.2 5.9*  ALBUMIN 2.9* 2.5* 3.0* 2.5*   No results for input(s): LIPASE, AMYLASE in the last 168 hours.  Recent Labs Lab 01/15/15 1516  AMMONIA 84*   CBC:  Recent Labs Lab 01/15/15 1430 01/16/15 0557 01/17/15 0600  WBC 6.0 4.0 5.0  NEUTROABS 4.1  --   --   HGB 12.9* 12.2* 13.3  HCT 39.6 36.1* 39.8  MCV 97.1 96.8 96.8  PLT 61* 50* 69*   ProBNP (last 3 results)  Recent Labs  07/31/14 1220  PROBNP  582.6*    CBG:  Recent Labs Lab 01/17/15 1122 01/17/15 1637 01/17/15 2115 01/18/15 0758 01/18/15 1129  GLUCAP 161* 124* 148* 108* 173*   Signed:  Jocilyn Trego  [facility] 01/18/2015, 2:15 PM

## 2015-01-19 ENCOUNTER — Telehealth: Payer: Self-pay | Admitting: Family Medicine

## 2015-01-19 MED ORDER — HYDROXYZINE HCL 10 MG PO TABS: 10.0000 mg | ORAL_TABLET | ORAL | Status: DC | PRN

## 2015-01-19 MED ORDER — OXYCODONE HCL 10 MG PO TABS: ORAL_TABLET | ORAL | Status: DC

## 2015-01-19 NOTE — Telephone Encounter (Signed)
Recall made 

## 2015-01-19 NOTE — Telephone Encounter (Signed)
Requesting hydroxyzine, Oxycodone, and something for abd and leg cramps.  Setup script for hydroxyzine and Oxycodone.

## 2015-01-22 ENCOUNTER — Telehealth: Payer: Self-pay

## 2015-01-22 ENCOUNTER — Other Ambulatory Visit: Payer: Self-pay | Admitting: Family Medicine

## 2015-01-22 NOTE — Telephone Encounter (Signed)
REVIEWED-NO ADDITIONAL RECOMMENDATIONS. 

## 2015-01-22 NOTE — Telephone Encounter (Signed)
Samples of Xifaxin 550 mg # 14 at front for pt to pick up and wife is aware. She said to let Almyra Free know that she got a notice from the [employer] assistance that a PA is required. Please call His wife, [name], if questions.

## 2015-01-23 ENCOUNTER — Other Ambulatory Visit: Payer: Self-pay | Admitting: *Deleted

## 2015-01-23 MED ORDER — GLIPIZIDE 10 MG PO TABS: 10.0000 mg | ORAL_TABLET | Freq: Every day | ORAL | Status: DC

## 2015-01-24 ENCOUNTER — Telehealth: Payer: Self-pay

## 2015-01-24 DIAGNOSIS — R188 Other ascites: Secondary | ICD-10-CM

## 2015-01-24 NOTE — Telephone Encounter (Signed)
PLEASE CALL PT. HE HAD A CT JUN 6 THAT DID NOT SHOW FLUID IN HIS ABDOMEN.

## 2015-01-24 NOTE — Telephone Encounter (Signed)
Pt called wanting to know if [name] ever received records from the hep C doctor. States that the Hep C doctor noted a lot of fluid on him that he may need to have drawn off. Please Advise

## 2015-01-25 NOTE — Telephone Encounter (Addendum)
Pt's wife is aware to take him to the ER. PT DID NOT GO TO ED AT [facility] OR [facility].  PLEASE CALL HIM FOR HIS SOB HE NEEDS: A LIMITED U/S TO ASSESS FOR ASCITES & POSSIBLE PARACENTESIS.   DIURETICS WERE STOPPED DUE TO PT C/O CRAMPING HIS EXTREMITIES. HE COULD RE-START ON ALDACTONE 100 MG AND LASIX 40 MG DAILY. HE SHOULD RE-STRICT HIS WATER INTAKE TO 2Ls/DAY AND STRICTLY FOLLOW A LOW SALT DIET.   REFER TO PULMONARY, DX: DYSPNEA EVALUATE FOR HEPATOPULMONARY SYNDROME/ COPD/CHRONIC BRONCHITIS. HIS MOST RECENT CHEST CT IN MAY 2016 SHOWED BRONCHITIS.

## 2015-01-25 NOTE — Telephone Encounter (Addendum)
PLEASE CALL PT. If he is having difficulty breathing he should go to the ED.

## 2015-01-25 NOTE — Telephone Encounter (Signed)
I spoke with [facility]- xifaxan pt assistance. The patient has been temporarily approved for xifaxan from 01/19/15-03/20/15. They have contacted him and mailed a letter asking him to apply for LIS (low income subsidy) which is a program to help him pay for his rx's. If pt is denied, he will need to send them a copy of the denial letter and they will extend his approval. I spoke with the pts wife and she is aware of this.

## 2015-01-25 NOTE — Telephone Encounter (Signed)
I informed pts wife. She said the pt is miserable and not feeling well. She said Dawn at the liver clinic told them he had about 3-6 months to live and told him there was a lot of fluid, she also said they were going to release him back to [facility] for care per pts wife. pts wife said he cannot lay down on his back to sleep because if he does he cant breathe. He has to sleep sitting up in a chair.

## 2015-01-26 ENCOUNTER — Ambulatory Visit (INDEPENDENT_AMBULATORY_CARE_PROVIDER_SITE_OTHER): Payer: Medicare Other | Admitting: Family Medicine

## 2015-01-26 ENCOUNTER — Encounter (HOSPITAL_COMMUNITY): Payer: Medicare Other | Attending: Oncology

## 2015-01-26 ENCOUNTER — Encounter: Payer: Self-pay | Admitting: Family Medicine

## 2015-01-26 VITALS — BP 158/85 | HR 89 | Temp 97.7°F | Ht 66.0 in | Wt 306.0 lb

## 2015-01-26 DIAGNOSIS — B182 Chronic viral hepatitis C: Secondary | ICD-10-CM | POA: Diagnosis not present

## 2015-01-26 DIAGNOSIS — D61818 Other pancytopenia: Secondary | ICD-10-CM | POA: Insufficient documentation

## 2015-01-26 DIAGNOSIS — S20219A Contusion of unspecified front wall of thorax, initial encounter: Secondary | ICD-10-CM | POA: Diagnosis not present

## 2015-01-26 DIAGNOSIS — N508 Other specified disorders of male genital organs: Secondary | ICD-10-CM

## 2015-01-26 DIAGNOSIS — K7290 Hepatic failure, unspecified without coma: Secondary | ICD-10-CM | POA: Diagnosis not present

## 2015-01-26 DIAGNOSIS — D509 Iron deficiency anemia, unspecified: Secondary | ICD-10-CM | POA: Insufficient documentation

## 2015-01-26 DIAGNOSIS — K7460 Unspecified cirrhosis of liver: Principal | ICD-10-CM

## 2015-01-26 DIAGNOSIS — N5089 Other specified disorders of the male genital organs: Secondary | ICD-10-CM

## 2015-01-26 LAB — COMPREHENSIVE METABOLIC PANEL
ALT: 88 U/L — AB (ref 17–63)
ANION GAP: 7 (ref 5–15)
AST: 146 U/L — ABNORMAL HIGH (ref 15–41)
Albumin: 2.6 g/dL — ABNORMAL LOW (ref 3.5–5.0)
Alkaline Phosphatase: 119 U/L (ref 38–126)
BUN: 12 mg/dL (ref 6–20)
CALCIUM: 8.5 mg/dL — AB (ref 8.9–10.3)
CO2: 23 mmol/L (ref 22–32)
Chloride: 108 mmol/L (ref 101–111)
Creatinine, Ser: 0.83 mg/dL (ref 0.61–1.24)
GFR calc Af Amer: >60 mL/min (ref >60)
GFR calc non Af Amer: >60 mL/min (ref >60)
Glucose, Bld: 163 mg/dL — ABNORMAL HIGH (ref 65–99)
Potassium: 4.7 mmol/L (ref 3.5–5.1)
Sodium: 138 mmol/L (ref 135–145)
TOTAL PROTEIN: 6.3 g/dL — AB (ref 6.5–8.1)
Total Bilirubin: 3.8 mg/dL — ABNORMAL HIGH (ref 0.3–1.2)

## 2015-01-26 LAB — CBC WITH DIFFERENTIAL/PLATELET
BASOS PCT: 0 % (ref 0–1)
Basophils Absolute: 0 10*3/uL (ref 0.0–0.1)
EOS ABS: 0.1 10*3/uL (ref 0.0–0.7)
Eosinophils Relative: 2 % (ref 0–5)
HCT: 34.8 % — ABNORMAL LOW (ref 39.0–52.0)
Hemoglobin: 11.7 g/dL — ABNORMAL LOW (ref 13.0–17.0)
Lymphocytes Relative: 25 % (ref 12–46)
Lymphs Abs: 0.9 10*3/uL (ref 0.7–4.0)
MCH: 32.6 pg (ref 26.0–34.0)
MCHC: 33.6 g/dL (ref 30.0–36.0)
MCV: 96.9 fL (ref 78.0–100.0)
Monocytes Absolute: 0.4 10*3/uL (ref 0.1–1.0)
Monocytes Relative: 13 % — ABNORMAL HIGH (ref 3–12)
Neutro Abs: 2.1 10*3/uL (ref 1.7–7.7)
Neutrophils Relative %: 61 % (ref 43–77)
Platelets: 57 10*3/uL — ABNORMAL LOW (ref 150–400)
RBC: 3.59 MIL/uL — ABNORMAL LOW (ref 4.22–5.81)
RDW: 15.6 % — AB (ref 11.5–15.5)
Smear Review: DECREASED
WBC: 3.4 10*3/uL — ABNORMAL LOW (ref 4.0–10.5)

## 2015-01-26 LAB — FERRITIN: FERRITIN: 227 ng/mL (ref 24–336)

## 2015-01-26 LAB — IRON AND TIBC
Iron: 115 ug/dL (ref 45–182)
SATURATION RATIOS: 51 % — AB (ref 17.9–39.5)
TIBC: 224 ug/dL — ABNORMAL LOW (ref 250–450)
UIBC: 109 ug/dL

## 2015-01-26 NOTE — Progress Notes (Signed)
Lab draw

## 2015-01-26 NOTE — Progress Notes (Signed)
Subjective:    Patient ID: [name], male    DOB: July 18, 1961, 54 y.o.   MRN: [id number]  HPI 54 year old male with chronic hepatitis C and liver failure. Apparently medicine that was being used for hep C failed and his only recourse now is liver transplant. Where he has been seen in [place] he was told he is not a candidate for liver transplant. We will try to get him referred to [facility] for consideration for do this through his gastroenterologist.    Patient Active Problem List   Diagnosis Date Noted  . Abnormal LFTs   . Scrotal edema 01/16/2015  . Liver failure, acute   . Portal venous hypertension   . Liver failure 01/15/2015  . Ascites 01/15/2015  . Pleuritic chest pain 12/21/2014  . Traumatic ecchymosis of chest 10/26/2014  . Right upper quadrant abdominal pain   . Chronic hepatitis C with cirrhosis   . Diabetes 07/31/2014  . Encephalopathy, hepatic 01/29/2014  . Angiodysplasia of colon 12/27/2013  . Hematochezia 11/03/2013  . Other pancytopenia 02/18/2013  . Iron (Fe) deficiency anemia 02/18/2013  . Colon cancer screening 08/28/2011  . DM 02/15/2010  . GERD 10/25/2009  . Hepatic cirrhosis 10/25/2009  . ALCOHOL ABUSE 06/05/2009  . UNSPECIFIED DISEASE OF PANCREAS 06/05/2009   Outpatient Encounter Prescriptions as of 01/26/2015  Medication Sig  . albuterol (PROVENTIL HFA;VENTOLIN HFA) 108 (90 BASE) MCG/ACT inhaler Inhale 2 puffs into the lungs every 6 (six) hours as needed. Shortness of breath  . clotrimazole (LOTRIMIN) 1 % cream Apply 1 application topically 2 (two) times daily.  . cyclobenzaprine (FLEXERIL) 5 MG tablet   . furosemide (LASIX) 20 MG tablet   . glipiZIDE (GLUCOTROL) 10 MG tablet Take 1 tablet (10 mg total) by mouth daily.  . hydrOXYzine (ATARAX/VISTARIL) 10 MG tablet Take 1 tablet (10 mg total) by mouth every 4 (four) hours as needed for itching.  . insulin glargine (LANTUS) 100 UNIT/ML injection Inject 10 Units into the skin at bedtime.  Marland Kitchen  lactulose (CHRONULAC) 10 GM/15ML solution Take 15 mLs (10 g total) by mouth 3 (three) times daily.  Marland Kitchen omeprazole (PRILOSEC) 20 MG capsule Take 2 capsules (40 mg total) by mouth daily.  . Oxycodone HCl 10 MG TABS 1 tablet every 6 hours PRN  . rifaximin (XIFAXAN) 550 MG TABS tablet Take 1 tablet (550 mg total) by mouth 2 (two) times daily.  Marland Kitchen spironolactone (ALDACTONE) 25 MG tablet TAKE ON MONDAY-WEDNESDAY-FRIDAY   No facility-administered encounter medications on file as of 01/26/2015.      Review of Systems  Constitutional: Positive for fatigue.  Gastrointestinal: Positive for abdominal pain and abdominal distention.  Skin: Negative.   Psychiatric/Behavioral: Negative.        Objective:   Physical Exam  Constitutional: He is oriented to person, place, and time. He appears well-developed and well-nourished.  Cardiovascular: Normal rate.   Pulmonary/Chest: Effort normal and breath sounds normal.  Abdominal: Soft. He exhibits distension. There is tenderness.  Neurological: He is alert and oriented to person, place, and time.  Psychiatric: He has a normal mood and affect.    BP 158/85 mmHg  Pulse 89  Temp(Src) 97.7 F (36.5 C) (Oral)  Ht 5\' 6"  (1.676 m)  Wt 306 lb (138.801 kg)  BMI 49.41 kg/m2  SpO2 100%       Assessment & Plan:    1. Chronic hepatitis C with cirrhosis No further medical treatment is available. As noted above consideration of liver transplant. Without  liver transplant he has been given a time/life expectancy of 3-6 maybe as long as 12 months or his hepatologist in [place]  2. Liver failure See above  3. Scrotal edema Edema has resolved but ascites persists he is off diarrhetic now for reasons that are not clear to me but without paracentesis it appears that has the ascites increases his breathing capability will decline  [name] MD

## 2015-01-29 NOTE — Telephone Encounter (Signed)
COPY OF STAFF MESSAGE BETWEEN DR. Sabra Heck AND DR. Leiland Mihelich:  [name],  [name] DECOMPENSATED LIVER DISEASE (CHILD PUGH C). HIS LIFE EXPECTANCY IS 1-3 YEARS.  HIS MELD SCORE IS 14. HE C/O SOB OFTEN. HE HAS AT LEAST 6 IMAGING STUDIES SINCE DEC 2015. MOST RECENTLY HE HAD A CT JUN 2016. THE IMAGING DOES NOT SHOW AND HAS NEVER REALLY SHOWN SIGNIFICANT ASCITES. DIURETICS FOR PERIPHERAL EDEMA WITH AN ALBUMIN OF 2.8 MAY BE INDICATED BUT INTRAVASCULAR DEPLETION IS HIS ENEMY AND WILL/CAN CAUSE ACUTE LIVER FAILURE FROM WHICH HE WILL NOT RECOVER.   IN MAY 2016 WHEN HE WEIGHED 295 LBS 7 IN JUN 2016 HIS CT SHOWED MINIMAL ASCITES. HE WAS C/O SOB AT THAT TIME. I DO SEE THAT HE GAINED AN ADDITIONAL 10-11 LBS SINCE MAY 2016. I WILL ORDER A LIMITED U/S TO ASSESS FOR ASCITES. I WOULD ONLY PUT [name] ON ALDACTONE 100 MG AND LASIX 40 MG DAILY. HE SHOULD RE-STRICT HIS WATER INTAKE TO 2Ls/DAY AND STRICTLY FOLLOW A LOW SALT DIET.   HE MAY HAVE HEPTOPULMONARY SYNDROME(HPS). I COULD SEND HIM TO PULMONARY FOR A SECOND OPINION BUT THEY WILL NOT BE ABLE TO HELP HIM WITH HPS. HOWEVER THEY COULD HELP HIM MANAGE COPD/CHRONIC BRONCHITIS. HIS MOST RECENT CHEST CT IN MAY 2016 SHOWED BRONCHITIS.  HIS CURRENT MELD WOULD NOT EVEN QUALIFY FOR LISTING.  HIS MOST RECENT USE OF COCAINE WAS IN DEC 2015 AND HE WOULD HAVE TO WAIT UNTIL AFTER  JUL 21 TO BE PLACED ON THE LIST. I DOUBT [facility] WOULD GIVE HIM A DIFFERENT ANSWER THAN CLT. CLT HAS THE FASTEST RATE OF GOING FROM LIST TO TRANSPLANT.   SINCE I HAVE KNOWN [name], HE HAS FAILED TO ADHERE TO MY RECOMMENDATIONS: 1. HIS WEIGHT HAS INCREASED FROM 2010 269 LBS TO 295 LBS, 2. FOLLOW THE CORRECT DIET, AND 3. AVOID COCAINE. HE ALSO HAS BEEN UNABLE TO MAINTAIN RELIABLE HEALTH INSURANCE SO HE CAN AFFORD HIS MEDICINE.   WHILE I LIKE [name] & WISH HIM THE BEST, HE HAS UNFORTUNATELY FAILED TO ACTIVELY PARTICIPATE IN HIS WELL BEING. AT THIS POINT THERE IS VERY LITTLE THAT CAN  BE DONE FOR HIM EXCEPT SUPPORTIVE CARE. HE MAY CHOSE TO FOCUS ON DYING, BUT HIS DEATH IS NOT IMMINENT. BEING TOO AGGRESSIVE WITH HIS DIURETIC THERAPY WOULD NOT BE IN HIS BEST INTEREST.  KINDEST REGARDS, Dixie Coppa     ===View-only below this line===  ----- Message -----    From: [name], MD    Sent: 01/26/2015   5:04 PM      To: Danie Binder, MD  Dear [name],         I saw [name] today in follow-up after his hospitalization. Although his spirits seem good, he was given a dismal prognosis since treatment for Hep C has pparently failed and he is not a candidate for liver transplant at [facility] in [place]. You probably already have received this information as well. There was mention of second opinion and possible consideration of transplant in [place]. Jsiah and I are looking to you to help facilitate an appointment there in hopes that he will be a candidate.  I am also concerned that diuretics have been stopped. He noted today that his breathing has become more difficult;  I suspect this is a function of limited diaphragmatic excursion due to the ascites. What do you think about restarting Lasix and Aldactone?  I appreciate your help in this man's care  and look forward to your comments.  Kindest regards,  Reginia Forts.D.

## 2015-01-31 NOTE — Telephone Encounter (Signed)
Please see med list. Dr. Sabra Heck sent in Lasix 20 mg on 01/18/2015 and Aldactone 25 mg on 6/13 to take one on Mon/wed/fri. OK TO CONTINUE WITH THIS MED REGIMEN.

## 2015-02-01 ENCOUNTER — Emergency Department (HOSPITAL_COMMUNITY): Payer: Medicare Other

## 2015-02-01 ENCOUNTER — Emergency Department (HOSPITAL_COMMUNITY)
Admission: EM | Admit: 2015-02-01 | Discharge: 2015-02-01 | Disposition: A | Discharge status: Home / Self Care | Payer: Medicare Other | Attending: Emergency Medicine | Admitting: Emergency Medicine

## 2015-02-01 ENCOUNTER — Other Ambulatory Visit: Payer: Self-pay

## 2015-02-01 ENCOUNTER — Encounter (HOSPITAL_COMMUNITY): Payer: Self-pay | Admitting: Emergency Medicine

## 2015-02-01 ENCOUNTER — Other Ambulatory Visit: Payer: Self-pay | Admitting: Gastroenterology

## 2015-02-01 ENCOUNTER — Ambulatory Visit (HOSPITAL_COMMUNITY)
Admission: RE | Admit: 2015-02-01 | Discharge: 2015-02-01 | Disposition: A | Discharge status: Home / Self Care | Payer: Medicare Other | Source: Ambulatory Visit | Attending: Gastroenterology | Admitting: Gastroenterology

## 2015-02-01 DIAGNOSIS — Z794 Long term (current) use of insulin: Secondary | ICD-10-CM | POA: Insufficient documentation

## 2015-02-01 DIAGNOSIS — Z862 Personal history of diseases of the blood and blood-forming organs and certain disorders involving the immune mechanism: Secondary | ICD-10-CM | POA: Insufficient documentation

## 2015-02-01 DIAGNOSIS — R188 Other ascites: Secondary | ICD-10-CM | POA: Diagnosis not present

## 2015-02-01 DIAGNOSIS — G8929 Other chronic pain: Secondary | ICD-10-CM | POA: Insufficient documentation

## 2015-02-01 DIAGNOSIS — Z8619 Personal history of other infectious and parasitic diseases: Secondary | ICD-10-CM | POA: Insufficient documentation

## 2015-02-01 DIAGNOSIS — R0601 Orthopnea: Secondary | ICD-10-CM | POA: Diagnosis not present

## 2015-02-01 DIAGNOSIS — K769 Liver disease, unspecified: Secondary | ICD-10-CM | POA: Insufficient documentation

## 2015-02-01 DIAGNOSIS — Z880 Allergy status to penicillin: Secondary | ICD-10-CM | POA: Diagnosis not present

## 2015-02-01 DIAGNOSIS — E119 Type 2 diabetes mellitus without complications: Secondary | ICD-10-CM | POA: Insufficient documentation

## 2015-02-01 DIAGNOSIS — R1011 Right upper quadrant pain: Secondary | ICD-10-CM | POA: Diagnosis not present

## 2015-02-01 DIAGNOSIS — K219 Gastro-esophageal reflux disease without esophagitis: Secondary | ICD-10-CM | POA: Diagnosis not present

## 2015-02-01 DIAGNOSIS — I10 Essential (primary) hypertension: Secondary | ICD-10-CM | POA: Insufficient documentation

## 2015-02-01 DIAGNOSIS — Z6841 Body Mass Index (BMI) 40.0 and over, adult: Secondary | ICD-10-CM | POA: Insufficient documentation

## 2015-02-01 DIAGNOSIS — J441 Chronic obstructive pulmonary disease with (acute) exacerbation: Secondary | ICD-10-CM | POA: Insufficient documentation

## 2015-02-01 DIAGNOSIS — K7290 Hepatic failure, unspecified without coma: Secondary | ICD-10-CM

## 2015-02-01 DIAGNOSIS — R609 Edema, unspecified: Secondary | ICD-10-CM | POA: Diagnosis not present

## 2015-02-01 DIAGNOSIS — Z79899 Other long term (current) drug therapy: Secondary | ICD-10-CM | POA: Insufficient documentation

## 2015-02-01 DIAGNOSIS — R109 Unspecified abdominal pain: Secondary | ICD-10-CM | POA: Diagnosis not present

## 2015-02-01 DIAGNOSIS — R0602 Shortness of breath: Secondary | ICD-10-CM | POA: Diagnosis present

## 2015-02-01 DIAGNOSIS — F1010 Alcohol abuse, uncomplicated: Secondary | ICD-10-CM | POA: Diagnosis not present

## 2015-02-01 LAB — CBC WITH DIFFERENTIAL/PLATELET
Basophils Absolute: 0 10*3/uL (ref 0.0–0.1)
Basophils Relative: 0 % (ref 0–1)
EOS ABS: 0.1 10*3/uL (ref 0.0–0.7)
Eosinophils Relative: 2 % (ref 0–5)
HCT: 31.8 % — ABNORMAL LOW (ref 39.0–52.0)
HEMOGLOBIN: 10.8 g/dL — AB (ref 13.0–17.0)
Lymphocytes Relative: 25 % (ref 12–46)
Lymphs Abs: 0.8 10*3/uL (ref 0.7–4.0)
MCH: 32.6 pg (ref 26.0–34.0)
MCHC: 34 g/dL (ref 30.0–36.0)
MCV: 96.1 fL (ref 78.0–100.0)
MONOS PCT: 18 % — AB (ref 3–12)
Monocytes Absolute: 0.6 10*3/uL (ref 0.1–1.0)
NEUTROS PCT: 56 % (ref 43–77)
Neutro Abs: 1.9 10*3/uL (ref 1.7–7.7)
PLATELETS: 57 10*3/uL — AB (ref 150–400)
RBC: 3.31 MIL/uL — ABNORMAL LOW (ref 4.22–5.81)
RDW: 14.9 % (ref 11.5–15.5)
WBC: 3.4 10*3/uL — ABNORMAL LOW (ref 4.0–10.5)

## 2015-02-01 LAB — COMPREHENSIVE METABOLIC PANEL
ALK PHOS: 109 U/L (ref 38–126)
ALT: 47 U/L (ref 17–63)
AST: 88 U/L — AB (ref 15–41)
Albumin: 2.7 g/dL — ABNORMAL LOW (ref 3.5–5.0)
Anion gap: 6 (ref 5–15)
BILIRUBIN TOTAL: 3.1 mg/dL — AB (ref 0.3–1.2)
BUN: 21 mg/dL — AB (ref 6–20)
CHLORIDE: 108 mmol/L (ref 101–111)
CO2: 24 mmol/L (ref 22–32)
CREATININE: 0.77 mg/dL (ref 0.61–1.24)
Calcium: 8.3 mg/dL — ABNORMAL LOW (ref 8.9–10.3)
GFR calc Af Amer: >60 mL/min (ref >60)
Glucose, Bld: 156 mg/dL — ABNORMAL HIGH (ref 65–99)
POTASSIUM: 4.3 mmol/L (ref 3.5–5.1)
Sodium: 138 mmol/L (ref 135–145)
Total Protein: 6.2 g/dL — ABNORMAL LOW (ref 6.5–8.1)

## 2015-02-01 LAB — TROPONIN I: Troponin I: <0.03 ng/mL (ref <0.031)

## 2015-02-01 LAB — LIPASE, BLOOD: LIPASE: 61 U/L — AB (ref 22–51)

## 2015-02-01 MED ORDER — HYDROMORPHONE HCL 1 MG/ML IJ SOLN
1.0000 mg | Freq: Once | INTRAMUSCULAR | Status: AC
  Administered 2015-02-01: 1 mg via INTRAVENOUS
  Filled 2015-02-01: qty 1

## 2015-02-01 NOTE — ED Notes (Signed)
Pt c/o sob x 4 days and ble swelling. Pt also has liver cirrhosis and was seen in radiology for paracentesis, but there was not ay fluid to draw off. Radiology RN sent pt over for signs/sx of CHF.

## 2015-02-01 NOTE — ED Provider Notes (Signed)
CSN: [id number]     Arrival date & time 02/01/15  1548 History  This chart was scribed for Nat Christen, MD by Rayfield Citizen, ED Scribe. This patient was seen in room APA08/APA08 and the patient's care was started at 8:11 PM.    Chief Complaint  Patient presents with  . Shortness of Breath   The history is provided by the patient. No language interpreter was used.     HPI Comments: [name] is a 54 y.o. male with complex past medical history of liver failure, paracentesis who presents to the Emergency Department complaining of 2 hours of cramping to hands and feet. Patient was in radiology suite in prep for paracentesis when no ascites fluid was discovered - he was sent to ED at that time. He complains of RUQ tenderness at present and was recently diagnosed with cholecystitis via [place]. No fever, chills, jaundicing, nausea, vomiting, diarrhea, chest pain, dyspnea.   Past Medical History  Diagnosis Date  . Hypertension   . Diabetes mellitus   . GERD (gastroesophageal reflux disease)     DEC 2010 EGD/Bx REACTIVE GASTROPATHY, NO VARICES  . Hemorrhoids, internal   . BMI 40.0-44.9, adult OCT 2010 269 LBS    APR 2012 279 LBS AUG 2014 185 LBS  . 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 IV drug abuse REMOTE  . Hepatitis 2010 HEP C    AST 509 ALT 267 ALK PHOS 165 ALB 3.8 NEG IGM HAV/HBSAg  . Gallstone AUG 2012 1 CM  . GERD 10/25/2009  . COPD (chronic obstructive pulmonary disease)   . Hepatitis C   . Pancytopenia 2013  . Splenomegaly 2013  . Other pancytopenia 02/18/2013  . Iron (Fe) deficiency anemia 02/18/2013  . Splenomegaly 02/18/2013  . Neuropathy   . Biliary dyskinesia JUL 2015    HIDA GB EF 5%  . Chronic pain   . Chronic leg pain     left  . Portal venous hypertension   . Asthma    Past Surgical History  Procedure Laterality Date  . Sigmoidoscopy      2001 DR. FLEISCHMAN INTERNAL HERMORRHOIDS  . Upper gastrointestinal endoscopy  DEC 2010    BENIGN POLYPS,  GASTRITIS, ?phg  . Knee surgery  RIGHT  . Hemorrhoid surgery    . Esophageal biopsy  09/08/2011    [name]:Moderate gastritis/Polyps, multiple in the body of the stomach  . Colonoscopy with propofol N/A 11/15/2013    [name]: rectal varices, small AVMs  . Esophagogastroduodenoscopy (egd) with propofol N/A 11/15/2013    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 Next EGD in April 2016.     Family History  Problem Relation Age of Onset  . Colon cancer Neg Hx   . Anesthesia problems Neg Hx   . Hypotension Neg Hx   . Malignant hyperthermia Neg Hx   . Pseudochol deficiency Neg Hx   . Cancer Father    History  Substance Use Topics  . Smoking status: Never Smoker   . Smokeless tobacco: Never Used  . Alcohol Use: No     Comment: 30 years ago    Review of Systems  A complete 10 system review of systems was obtained and all systems are negative except as noted in the HPI and PMH.    Allergies  Penicillins  Home Medications   Prior to Admission medications   Medication Sig Start Date End  Date Taking? Authorizing Provider  albuterol (PROVENTIL HFA;VENTOLIN HFA) 108 (90 BASE) MCG/ACT inhaler Inhale 2 puffs into the lungs every 6 (six) hours as needed. Shortness of breath 08/14/14  Yes [name], MD  clotrimazole (LOTRIMIN) 1 % cream Apply 1 application topically 2 (two) times daily. 01/18/15  Yes Orvan Falconer, MD  furosemide (LASIX) 20 MG tablet Take 20 mg by mouth at bedtime.  01/18/15  Yes Historical Provider, MD  glipiZIDE (GLUCOTROL) 10 MG tablet Take 1 tablet (10 mg total) by mouth daily. 01/23/15  Yes [name], MD  Insulin Glargine (LANTUS SOLOSTAR) 100 UNIT/ML Solostar Pen Inject 10 Units into the skin at bedtime.   Yes Historical Provider, MD  lactulose (CHRONULAC) 10 GM/15ML solution Take 15 mLs (10 g total) by mouth 3 (three) times daily. 12/19/14  Yes [name], MD  omeprazole (PRILOSEC) 20 MG capsule  Take 2 capsules (40 mg total) by mouth daily. 10/16/14  Yes [name], MD  rifaximin (XIFAXAN) 550 MG TABS tablet Take 1 tablet (550 mg total) by mouth 2 (two) times daily. 12/19/14  Yes [name], MD  spironolactone (ALDACTONE) 25 MG tablet TAKE ON MONDAY-WEDNESDAY-FRIDAY 01/22/15  Yes [name], MD  cyclobenzaprine (FLEXERIL) 5 MG tablet Take 5 mg by mouth 3 (three) times daily as needed for muscle spasms.  01/25/15   Historical Provider, MD  hydrOXYzine (ATARAX/VISTARIL) 10 MG tablet Take 1 tablet (10 mg total) by mouth every 4 (four) hours as needed for itching. 01/19/15   [name], MD  Oxycodone HCl 10 MG TABS 1 tablet every 6 hours PRN Patient taking differently: Take 10 mg by mouth every 6 (six) hours as needed (pain).  01/19/15   [name], MD   BP 114/58 mmHg  Pulse 66  Temp(Src) 98 F (36.7 C) (Oral)  Resp 13  Ht $R'5\' 6"'cr$  (1.676 m)  Wt 310 lb (140.615 kg)  BMI 50.06 kg/m2  SpO2 100% Physical Exam  Constitutional: He is oriented to person, place, and time. He appears well-developed and well-nourished. No distress.  Does not appear in acute distress  HENT:  Head: Normocephalic and atraumatic.  Eyes: Conjunctivae and EOM are normal. Pupils are equal, round, and reactive to light.  Neck: Normal range of motion. Neck supple.  Cardiovascular: Normal rate and regular rhythm.   Pulmonary/Chest: Effort normal and breath sounds normal.  Abdominal: Soft. Bowel sounds are normal. There is tenderness (Minimal RUQ tenderness).  Musculoskeletal: Normal range of motion. He exhibits edema.  2-3+ peripheral edema  Neurological: He is alert and oriented to person, place, and time.  Skin: Skin is warm and dry.  Psychiatric: He has a normal mood and affect. His behavior is normal.  Nursing note and vitals reviewed.   ED Course  Procedures   DIAGNOSTIC STUDIES: Oxygen Saturation is 100% on RA, normal by my interpretation.    COORDINATION OF CARE: 8:13 PM  Discussed treatment plan with pt at bedside, including re-ultrasound of RUQ and basic labs. Patient agreed to plan.  Labs Review Labs Reviewed  COMPREHENSIVE METABOLIC PANEL - Abnormal; Notable for the following:    Glucose, Bld 156 (*)    BUN 21 (*)    Calcium 8.3 (*)    Total Protein 6.2 (*)    Albumin 2.7 (*)    AST 88 (*)    Total Bilirubin 3.1 (*)    All other components within normal limits  CBC WITH DIFFERENTIAL/PLATELET - Abnormal; Notable for the  following:    WBC 3.4 (*)    RBC 3.31 (*)    Hemoglobin 10.8 (*)    HCT 31.8 (*)    Platelets 57 (*)    Monocytes Relative 18 (*)    All other components within normal limits  LIPASE, BLOOD - Abnormal; Notable for the following:    Lipase 61 (*)    All other components within normal limits  TROPONIN I    Imaging Review US Abdomen Limited  02/01/2015   CLINICAL DATA:  RIGHT mid abdominal pain, history cirrhosis, ascites, peripheral edema, orthopnea, diabetes mellitus, ethanol abuse  EXAM: LIMITED ABDOMEN ULTRASOUND FOR ASCITES  TECHNIQUE: Limited ultrasound survey for ascites was performed in all four abdominal quadrants.  COMPARISON:  CT abdomen 01/15/2015  FINDINGS: No significant ascites identified.  Patient is tender nor RIGHT mid abdomen.  Survey imaging of this site fails identified ventral hernia or gross mass.  Gallbladder wall appears thickened, nonspecific.  Shadowing gallstones and sludge present within gallbladder.  IMPRESSION: No ascites identified; paracentesis not performed.  Nonspecific thickening of the gallbladder wall with gallstones and gallbladder sludge incidentally noted.   Electronically Signed   By: Lavonia Dana M.D.   On: 02/01/2015 17:28   Dg Chest Port 1 View  02/01/2015   CLINICAL DATA:  Shortness of breath. Abdominal swelling. COPD. Cirrhosis.  EXAM: PORTABLE CHEST - 1 VIEW  COMPARISON:  None.  FINDINGS: The heart size and mediastinal contours are within normal limits. Both lungs are clear. No evidence of  pneumothorax or pleural effusion.  IMPRESSION: Stable exam.  No active disease.   Electronically Signed   By: Earle Gell M.D.   On: 02/01/2015 17:16   US Abdomen Limited Ruq  02/01/2015   CLINICAL DATA:  RIGHT upper quadrant pain  EXAM: US ABDOMEN LIMITED - RIGHT UPPER QUADRANT  COMPARISON:  None ; correlation CT abdomen 01/15/2015  FINDINGS: Image quality degraded secondary to body habitus.  Gallbladder:  Thickened gallbladder wall. Multiple small shadowing calculi and sludge within gallbladder. No sonographic Murphy sign. No definite pericholecystic fluid.  Common bile duct:  Diameter: Question 6 mm diameter, poorly visualized due to body habitus and increased sound attenuation of the liver.  Liver:  Cirrhotic nodular margins. Increased echogenicity with sound attenuation. No discrete mass or nodularity. Hepatopetal portal venous flow. Visualized hepatic veins appear patent.  No RIGHT upper quadrant free fluid.  IMPRESSION: Cirrhotic liver.  Thickened gallbladder wall with gallstones and sludge within gallbladder though no discrete sonographic Murphy sign or pericholecystic fluid identified.  Gallbladder wall thickness has increased since 07/19/2014 but is nonspecific in a cirrhotic patient has a history of ascites and it is impossible to exclude acute cholecystitis in this setting.   Electronically Signed   By: Lavonia Dana M.D.   On: 02/01/2015 17:35     EKG Interpretation   Date/Time:  Thursday February 01 2015 16:04:35 EDT Ventricular Rate:  83 PR Interval:  148 QRS Duration: 90 QT Interval:  423 QTC Calculation: 497 R Axis:   29 Text Interpretation:  Sinus rhythm Borderline prolonged QT interval ED  PHYSICIAN INTERPRETATION AVAILABLE IN [facility] Confirmed by TEST,  Record ([id number]) on 02/02/2015 7:40:22 AM      MDM   Final diagnoses:  RUQ pain  Liver failure   Ultrasound shows cirrhotic liver with no discrete sonographic Murphy sign.  Total bili 3.1; liver functions otherwise  normal. Patient has close follow-up. He states he feels much better at discharge   I personally  performed the services described in this documentation, which was scribed in my presence. The recorded information has been reviewed and is accurate.      Nat Christen, MD 02/02/15 [phone]

## 2015-02-01 NOTE — ED Notes (Signed)
Dr Lacinda Axon at the bedside, wife at the bedside.

## 2015-02-01 NOTE — Discharge Instructions (Signed)
Follow-up your primary care doctor.  Tests showed no life-threatening condition

## 2015-02-07 ENCOUNTER — Telehealth: Payer: Self-pay

## 2015-02-07 ENCOUNTER — Telehealth: Payer: Self-pay | Admitting: Family Medicine

## 2015-02-07 DIAGNOSIS — K7469 Other cirrhosis of liver: Secondary | ICD-10-CM

## 2015-02-07 NOTE — Telephone Encounter (Signed)
Please review and advise.

## 2015-02-07 NOTE — Telephone Encounter (Signed)
Pt's wife is calling because she is wanting to talk to [facility]. She took him to the hospital for the [place] Para but they would not do it because there was not fluid, then she had to take him to the ER and they told them there was nothing they could do. She would like to have [facility] call her back today if possible.

## 2015-02-08 NOTE — Telephone Encounter (Signed)
SPOKE WITH PT'S WIFE. EXPLAINED HE IS NOT DYING ANYTIME SOON. HE NEEDS TO CONCENTRATE ON LIVING A HEALTHY LIFE. AVOID RECREATIONAL DRUGS, EXERCISE, AND LOSE WEIGHT. THE HEP C IS CHRONIC BUT SHOULD NOT CAUSE HIS LIVER TO GET WORSE ESPECIALLY IF HE LOSES WEIGHT.

## 2015-02-08 NOTE — Telephone Encounter (Signed)
Called patient TO DISCUSS RESULTS. NO VOICE MAILBOX ON CELL/HOME.

## 2015-02-09 ENCOUNTER — Telehealth: Payer: Self-pay

## 2015-02-09 MED ORDER — LACTULOSE 10 GM/15ML PO SOLN: 10.0000 g | Freq: Three times a day (TID) | ORAL | Status: DC

## 2015-02-09 NOTE — Telephone Encounter (Signed)
Last seen 01/26/15 Dr Sabra Heck  Last filled 01/19/15 #120   If approved print

## 2015-02-09 NOTE — Telephone Encounter (Signed)
Cannot refill remotely 

## 2015-02-09 NOTE — Telephone Encounter (Signed)
Pt aware oxycodone will need to wait till next week when Dr. Sabra Heck returns

## 2015-02-09 NOTE — Telephone Encounter (Signed)
Oxycodone not due till 02/18/15- will have to wait for Dr. Sabra Heck to come back

## 2015-02-13 ENCOUNTER — Other Ambulatory Visit: Payer: Self-pay | Admitting: Family Medicine

## 2015-02-14 MED ORDER — OXYCODONE HCL 10 MG PO TABS: ORAL_TABLET | ORAL | Status: DC

## 2015-02-14 NOTE — Telephone Encounter (Signed)
Script upfront for patient to pickup. Wife will let him know.

## 2015-02-14 NOTE — Telephone Encounter (Signed)
NA

## 2015-02-14 NOTE — Telephone Encounter (Signed)
Rx refilled as per patient request

## 2015-03-12 ENCOUNTER — Encounter: Payer: Self-pay | Admitting: Gastroenterology

## 2015-03-13 ENCOUNTER — Telehealth: Payer: Self-pay | Admitting: Family Medicine

## 2015-03-13 MED ORDER — OXYCODONE HCL 10 MG PO TABS: ORAL_TABLET | ORAL | Status: DC

## 2015-03-13 NOTE — Telephone Encounter (Signed)
Patient is requesting a refill on his oxycodone. He is due on 8/6.

## 2015-03-13 NOTE — Telephone Encounter (Signed)
RX ready for pick up 

## 2015-03-22 ENCOUNTER — Ambulatory Visit: Payer: Medicare Other | Admitting: Family Medicine

## 2015-04-03 ENCOUNTER — Ambulatory Visit: Payer: Medicare Other | Admitting: Family Medicine

## 2015-04-09 ENCOUNTER — Telehealth: Payer: Self-pay | Admitting: Family Medicine

## 2015-04-09 NOTE — Telephone Encounter (Signed)
Patient was given #120 on 03/13/15. Directions are to take 1 tablet every 6 hrs prn.  He is taking these around the clock and sometimes has to take more than 4 a day.

## 2015-04-10 ENCOUNTER — Ambulatory Visit (INDEPENDENT_AMBULATORY_CARE_PROVIDER_SITE_OTHER): Payer: Medicare Other | Admitting: Family Medicine

## 2015-04-10 ENCOUNTER — Encounter: Payer: Self-pay | Admitting: Family Medicine

## 2015-04-10 VITALS — BP 149/78 | HR 84 | Temp 98.5°F | Ht 66.0 in | Wt 312.0 lb

## 2015-04-10 DIAGNOSIS — E119 Type 2 diabetes mellitus without complications: Secondary | ICD-10-CM

## 2015-04-10 DIAGNOSIS — K7469 Other cirrhosis of liver: Secondary | ICD-10-CM | POA: Diagnosis not present

## 2015-04-10 MED ORDER — OXYCODONE HCL 5 MG PO TABS: 5.0000 mg | ORAL_TABLET | ORAL | Status: DC | PRN

## 2015-04-10 MED ORDER — OXYCODONE HCL ER 30 MG PO T12A: 1.0000 | EXTENDED_RELEASE_TABLET | Freq: Two times a day (BID) | ORAL | Status: DC

## 2015-04-10 MED ORDER — OXYCODONE HCL 10 MG PO TABS: ORAL_TABLET | ORAL | Status: DC

## 2015-04-10 NOTE — Progress Notes (Signed)
 Subjective:    Patient ID: [name], male    DOB: 12/05/1960, 54 y.o.   MRN: [id number]  HPI 54-year-old gentleman with hepatitis C, chronic pain, cirrhosis and diabetes. He is followed by GI for his hep C and has an appointment there next month. He continues to have significant pain presumably on the basis of his cirrhosis and liver disease. He is using oxycodone 10 mg about 6 times per day on average he's had no recent issues with encephalopathy and per he and his wife are taking lactulose regularly.  Patient Active Problem List   Diagnosis Date Noted  . Abnormal LFTs   . Scrotal edema 01/16/2015  . Liver failure, acute   . Portal venous hypertension   . Liver failure 01/15/2015  . Ascites 01/15/2015  . Pleuritic chest pain 12/21/2014  . Traumatic ecchymosis of chest 10/26/2014  . Right upper quadrant abdominal pain   . Chronic hepatitis C with cirrhosis   . Diabetes 07/31/2014  . Encephalopathy, hepatic 01/29/2014  . Angiodysplasia of colon 12/27/2013  . Hematochezia 11/03/2013  . Other pancytopenia 02/18/2013  . Iron (Fe) deficiency anemia 02/18/2013  . Colon cancer screening 08/28/2011  . DM 02/15/2010  . GERD 10/25/2009  . Hepatic cirrhosis 10/25/2009  . ALCOHOL ABUSE 06/05/2009  . UNSPECIFIED DISEASE OF PANCREAS 06/05/2009   Outpatient Encounter Prescriptions as of 04/10/2015  Medication Sig  . albuterol (PROVENTIL HFA;VENTOLIN HFA) 108 (90 BASE) MCG/ACT inhaler Inhale 2 puffs into the lungs every 6 (six) hours as needed. Shortness of breath  . clotrimazole (LOTRIMIN) 1 % cream Apply 1 application topically 2 (two) times daily.  . cyclobenzaprine (FLEXERIL) 5 MG tablet Take 5 mg by mouth 3 (three) times daily as needed for muscle spasms.   . furosemide (LASIX) 20 MG tablet Take 20 mg by mouth at bedtime.   . glipiZIDE (GLUCOTROL) 10 MG tablet Take 1 tablet (10 mg total) by mouth daily.  . hydrOXYzine (ATARAX/VISTARIL) 10 MG tablet Take 1 tablet (10 mg total) by  mouth every 4 (four) hours as needed for itching.  . Insulin Glargine (LANTUS SOLOSTAR) 100 UNIT/ML Solostar Pen Inject 10 Units into the skin at bedtime.  . lactulose (CHRONULAC) 10 GM/15ML solution Take 15 mLs (10 g total) by mouth 3 (three) times daily.  . omeprazole (PRILOSEC) 20 MG capsule Take 2 capsules (40 mg total) by mouth daily.  . Oxycodone HCl 10 MG TABS 1 tablet every 6 hours PRN  . rifaximin (XIFAXAN) 550 MG TABS tablet Take 1 tablet (550 mg total) by mouth 2 (two) times daily.  . spironolactone (ALDACTONE) 25 MG tablet TAKE ON MONDAY-WEDNESDAY-FRIDAY   No facility-administered encounter medications on file as of 04/10/2015.      Review of Systems  Constitutional: Positive for unexpected weight change.  HENT: Negative.   Respiratory: Negative.   Cardiovascular: Negative.   Gastrointestinal: Positive for abdominal pain.  Musculoskeletal: Positive for arthralgias.  Psychiatric/Behavioral: Negative.        Objective:   Physical Exam  Constitutional: He appears well-developed and well-nourished.  Cardiovascular: Normal rate and regular rhythm.   Pulmonary/Chest: Effort normal and breath sounds normal.  Abdominal: Soft. He exhibits distension. There is tenderness.  Musculoskeletal: Normal range of motion.  Psychiatric: He has a normal mood and affect. His behavior is normal.    BP 149/78 mmHg  Pulse 84  Temp(Src) 98.5 F (36.9 C) (Oral)  Ht 5' 6" (1.676 m)  Wt 312 lb (141.522 kg)  BMI 50.38 kg/m2         Assessment & Plan:  1. Type 2 diabetes mellitus without complication Diabetes is fairly well controlled on simple regimen of glipizide and Lantus - POCT UA - Microalbumin - BMP8+EGFR - CBC with Differential/Platelet   2. Other cirrhosis of liver Liver disease seems stable. His energy level is good he's had no encephalopathic symptoms or jaundice. He continues on both Lasix and spironolactone for edema. To follow-up with gastroenterology next  month  Stephen M Miller MD  

## 2015-04-10 NOTE — Telephone Encounter (Signed)
Spoke with pt's wife as she is listed on his ROI form.  Advised it is too early for patient to get refill on pain medication.  She states patient has been having severe lower abdominal pain, and taking more than 4 oxycodone per day at times.  Per patient's wife, he cancelled appointment last week with Dr. Sabra Heck due to the death of their dog.  Rescheduled patient to come in for appointment with Dr. Sabra Heck today at 3:30 pm.

## 2015-04-11 LAB — CBC WITH DIFFERENTIAL/PLATELET
BASOS: 0 %
Basophils Absolute: 0 10*3/uL (ref 0.0–0.2)
EOS (ABSOLUTE): 0.1 10*3/uL (ref 0.0–0.4)
EOS: 4 %
HEMATOCRIT: 35.7 % — AB (ref 37.5–51.0)
HEMOGLOBIN: 12.1 g/dL — AB (ref 12.6–17.7)
IMMATURE GRANS (ABS): 0 10*3/uL (ref 0.0–0.1)
IMMATURE GRANULOCYTES: 0 %
Lymphocytes Absolute: 1 10*3/uL (ref 0.7–3.1)
Lymphs: 26 %
MCH: 32.6 pg (ref 26.6–33.0)
MCHC: 33.9 g/dL (ref 31.5–35.7)
MCV: 96 fL (ref 79–97)
MONOS ABS: 0.4 10*3/uL (ref 0.1–0.9)
Monocytes: 11 %
NEUTROS ABS: 2.1 10*3/uL (ref 1.4–7.0)
NEUTROS PCT: 59 %
Platelets: 57 10*3/uL — CL (ref 150–379)
RBC: 3.71 x10E6/uL — ABNORMAL LOW (ref 4.14–5.80)
RDW: 13.8 % (ref 12.3–15.4)
WBC: 3.7 10*3/uL (ref 3.4–10.8)

## 2015-04-11 LAB — BMP8+EGFR
BUN / CREAT RATIO: 13 (ref 9–20)
BUN: 10 mg/dL (ref 6–24)
CO2: 22 mmol/L (ref 18–29)
Calcium: 8.1 mg/dL — ABNORMAL LOW (ref 8.7–10.2)
Chloride: 105 mmol/L (ref 97–108)
Creatinine, Ser: 0.79 mg/dL (ref 0.76–1.27)
GFR calc Af Amer: 119 mL/min/{1.73_m2} (ref >59)
GFR calc non Af Amer: 103 mL/min/{1.73_m2} (ref >59)
GLUCOSE: 215 mg/dL — AB (ref 65–99)
Potassium: 4.2 mmol/L (ref 3.5–5.2)
Sodium: 141 mmol/L (ref 134–144)

## 2015-04-13 ENCOUNTER — Ambulatory Visit (INDEPENDENT_AMBULATORY_CARE_PROVIDER_SITE_OTHER): Payer: Medicare Other | Admitting: Family Medicine

## 2015-04-13 ENCOUNTER — Telehealth: Payer: Self-pay | Admitting: Family Medicine

## 2015-04-13 VITALS — BP 158/78 | HR 85 | Ht 66.0 in | Wt 318.0 lb

## 2015-04-13 DIAGNOSIS — R1011 Right upper quadrant pain: Secondary | ICD-10-CM | POA: Diagnosis not present

## 2015-04-13 DIAGNOSIS — K7031 Alcoholic cirrhosis of liver with ascites: Secondary | ICD-10-CM

## 2015-04-13 DIAGNOSIS — K7290 Hepatic failure, unspecified without coma: Secondary | ICD-10-CM

## 2015-04-13 DIAGNOSIS — K766 Portal hypertension: Secondary | ICD-10-CM

## 2015-04-13 MED ORDER — OXYCODONE HCL ER 15 MG PO T12A: 15.0000 mg | EXTENDED_RELEASE_TABLET | Freq: Two times a day (BID) | ORAL | Status: DC

## 2015-04-13 NOTE — Progress Notes (Signed)
Subjective:  Patient ID: [name], male    DOB: Apr 11, 1961  Age: 54 y.o. MRN: [id number]  CC: Medication Problem   HPI [name] presents for reaction to the new dose of pain med. Had profuse vomiting. Pt. Decided to throw them away, but changed his mind after disposing of a few (Pill count showed 10 short) and brought them here instead. Pt. Agreed to pill count and  Witnessed disposal of the 30 mg tabs. He was noted by staff to have 44 tablets.  Pt. Uses med for pain related to his cirrhosis. Recovering alcoholic. he was declined for transplant due to his smoking history and his weight. He is trying to lose his weight and stop smoking.  History [name] has a past medical history of Hypertension; Diabetes mellitus; GERD (gastroesophageal reflux disease); Hemorrhoids, internal; BMI 40.0-44.9, adult (OCT 2010 269 LBS); Cirrhosis (NOV 2010 [facility] A); IV drug abuse (REMOTE); Hepatitis (2010 HEP C); Gallstone (AUG 2012 1 CM); GERD (10/25/2009); COPD (chronic obstructive pulmonary disease); Hepatitis C; Pancytopenia (2013); Splenomegaly (2013); Other pancytopenia (02/18/2013); Iron (Fe) deficiency anemia (02/18/2013); Splenomegaly (02/18/2013); Neuropathy; Biliary dyskinesia (JUL 2015); Chronic pain; Chronic leg pain; Portal venous hypertension; and Asthma.   He has past surgical history that includes Sigmoidoscopy; Upper gastrointestinal endoscopy (DEC 2010); Knee surgery (RIGHT); Hemorrhoid surgery; esophageal biopsy (09/08/2011); Colonoscopy with propofol (N/A, 11/15/2013); and Esophagogastroduodenoscopy (egd) with propofol (N/A, 11/15/2013).   His family history includes Cancer in his father. There is no history of Colon cancer, Anesthesia problems, Hypotension, Malignant hyperthermia, or Pseudochol deficiency.He reports that he has been smoking Cigarettes.  He has been smoking about 0.50 packs per day. He has never used smokeless tobacco. He reports that he does not drink alcohol or use illicit  drugs.  Outpatient Prescriptions Prior to Visit  Medication Sig Dispense Refill  . albuterol (PROVENTIL HFA;VENTOLIN HFA) 108 (90 BASE) MCG/ACT inhaler Inhale 2 puffs into the lungs every 6 (six) hours as needed. Shortness of breath 1 Inhaler 1  . clotrimazole (LOTRIMIN) 1 % cream Apply 1 application topically 2 (two) times daily. 30 g 0  . cyclobenzaprine (FLEXERIL) 5 MG tablet Take 5 mg by mouth 3 (three) times daily as needed for muscle spasms.     . furosemide (LASIX) 20 MG tablet Take 20 mg by mouth at bedtime.     Marland Kitchen glipiZIDE (GLUCOTROL) 10 MG tablet Take 1 tablet (10 mg total) by mouth daily. 30 tablet 2  . hydrOXYzine (ATARAX/VISTARIL) 10 MG tablet Take 1 tablet (10 mg total) by mouth every 4 (four) hours as needed for itching. 30 tablet 1  . Insulin Glargine (LANTUS SOLOSTAR) 100 UNIT/ML Solostar Pen Inject 10 Units into the skin at bedtime.    Marland Kitchen lactulose (CHRONULAC) 10 GM/15ML solution Take 15 mLs (10 g total) by mouth 3 (three) times daily. 473 mL 1  . omeprazole (PRILOSEC) 20 MG capsule Take 2 capsules (40 mg total) by mouth daily. 60 capsule 2  . oxyCODONE (OXY IR/ROXICODONE) 5 MG immediate release tablet Take 1 tablet (5 mg total) by mouth every 4 (four) hours as needed for severe pain. 30 tablet 0  . rifaximin (XIFAXAN) 550 MG TABS tablet Take 1 tablet (550 mg total) by mouth 2 (two) times daily. 60 tablet 5  . spironolactone (ALDACTONE) 25 MG tablet TAKE ON MONDAY-WEDNESDAY-FRIDAY 30 tablet 3  . OxyCODONE HCl ER 30 MG T12A Take 1 tablet by mouth 2 (two) times daily. 60 each 0   No facility-administered medications  prior to visit.    ROS Review of Systems  Constitutional: Positive for activity change (decreased). Negative for chills, diaphoresis and fatigue.  HENT: Negative for congestion and rhinorrhea.   Respiratory: Negative for cough and shortness of breath.   Cardiovascular: Negative for chest pain and palpitations.  Gastrointestinal: Positive for nausea, vomiting,  abdominal pain (right upper quadrant) and abdominal distention. Negative for diarrhea and constipation.  Genitourinary: Negative for dysuria and frequency.  Musculoskeletal: Negative for myalgias and arthralgias.  Skin: Negative for rash and wound.  All other systems reviewed and are negative.   Objective:  BP 158/78 mmHg  Pulse 85  Ht 5\' 6"  (1.676 m)  Wt 318 lb (144.244 kg)  BMI 51.35 kg/m2  BP Readings from Last 3 Encounters:  04/13/15 158/78  04/10/15 149/78  02/01/15 114/58    Wt Readings from Last 3 Encounters:  04/13/15 318 lb (144.244 kg)  04/10/15 312 lb (141.522 kg)  02/01/15 310 lb (140.615 kg)     Physical Exam  Constitutional: He is oriented to person, place, and time. He appears well-developed and well-nourished.  HENT:  Head: Normocephalic and atraumatic.  Right Ear: Tympanic membrane and external ear normal. No decreased hearing is noted.  Left Ear: Tympanic membrane and external ear normal. No decreased hearing is noted.  Mouth/Throat: No oropharyngeal exudate or posterior oropharyngeal erythema.  Eyes: Pupils are equal, round, and reactive to light.  Neck: Normal range of motion. Neck supple.  Cardiovascular: Normal rate and regular rhythm.   No murmur heard. Pulmonary/Chest: Breath sounds normal. No respiratory distress.  Abdominal: Soft. Bowel sounds are normal. He exhibits distension. He exhibits no mass. There is tenderness.  Neurological: He is alert and oriented to person, place, and time.  Psychiatric: He has a normal mood and affect. His behavior is normal. Judgment and thought content normal.  Vitals reviewed.   Lab Results  Component Value Date   HGBA1C 6.4* 01/16/2015   HGBA1C 5.9 10/10/2014   HGBA1C 6.0 03/01/2014    Lab Results  Component Value Date   WBC 3.7 04/10/2015   HGB 10.8* 02/01/2015   HCT 35.7* 04/10/2015   PLT 57* 02/01/2015   GLUCOSE 215* 04/10/2015   ALT 47 02/01/2015   AST 88* 02/01/2015   NA 141 04/10/2015   K  4.2 04/10/2015   CL 105 04/10/2015   CREATININE 0.79 04/10/2015   BUN 10 04/10/2015   CO2 22 04/10/2015   INR 1.25 01/18/2015   HGBA1C 6.4* 01/16/2015    US Abdomen Limited  02/01/2015   CLINICAL DATA:  RIGHT mid abdominal pain, history cirrhosis, ascites, peripheral edema, orthopnea, diabetes mellitus, ethanol abuse  EXAM: LIMITED ABDOMEN ULTRASOUND FOR ASCITES  TECHNIQUE: Limited ultrasound survey for ascites was performed in all four abdominal quadrants.  COMPARISON:  CT abdomen 01/15/2015  FINDINGS: No significant ascites identified.  Patient is tender nor RIGHT mid abdomen.  Survey imaging of this site fails identified ventral hernia or gross mass.  Gallbladder wall appears thickened, nonspecific.  Shadowing gallstones and sludge present within gallbladder.  IMPRESSION: No ascites identified; paracentesis not performed.  Nonspecific thickening of the gallbladder wall with gallstones and gallbladder sludge incidentally noted.   Electronically Signed   By: Lavonia Dana M.D.   On: 02/01/2015 17:28   Dg Chest Port 1 View  02/01/2015   CLINICAL DATA:  Shortness of breath. Abdominal swelling. COPD. Cirrhosis.  EXAM: PORTABLE CHEST - 1 VIEW  COMPARISON:  None.  FINDINGS: The heart size and mediastinal contours  are within normal limits. Both lungs are clear. No evidence of pneumothorax or pleural effusion.  IMPRESSION: Stable exam.  No active disease.   Electronically Signed   By: Earle Gell M.D.   On: 02/01/2015 17:16   US Abdomen Limited Ruq  02/01/2015   CLINICAL DATA:  RIGHT upper quadrant pain  EXAM: US ABDOMEN LIMITED - RIGHT UPPER QUADRANT  COMPARISON:  None ; correlation CT abdomen 01/15/2015  FINDINGS: Image quality degraded secondary to body habitus.  Gallbladder:  Thickened gallbladder wall. Multiple small shadowing calculi and sludge within gallbladder. No sonographic Murphy sign. No definite pericholecystic fluid.  Common bile duct:  Diameter: Question 6 mm diameter, poorly visualized due  to body habitus and increased sound attenuation of the liver.  Liver:  Cirrhotic nodular margins. Increased echogenicity with sound attenuation. No discrete mass or nodularity. Hepatopetal portal venous flow. Visualized hepatic veins appear patent.  No RIGHT upper quadrant free fluid.  IMPRESSION: Cirrhotic liver.  Thickened gallbladder wall with gallstones and sludge within gallbladder though no discrete sonographic Murphy sign or pericholecystic fluid identified.  Gallbladder wall thickness has increased since 07/19/2014 but is nonspecific in a cirrhotic patient has a history of ascites and it is impossible to exclude acute cholecystitis in this setting.   Electronically Signed   By: Lavonia Dana M.D.   On: 02/01/2015 17:35    Assessment & Plan:   [name] was seen today for medication problem.  Diagnoses and all orders for this visit:  Liver failure  Alcoholic cirrhosis of liver with ascites  Portal venous hypertension  Right upper quadrant abdominal pain  Other orders -     OxyCODONE (OXYCONTIN) 15 mg T12A 12 hr tablet; Take 1 tablet (15 mg total) by mouth every 12 (twelve) hours.   I have discontinued Mr. Leighty OxyCODONE HCl ER. I am also having him start on OxyCODONE. Additionally, I am having him maintain his albuterol, omeprazole, rifaximin, clotrimazole, hydrOXYzine, spironolactone, glipiZIDE, cyclobenzaprine, furosemide, Insulin Glargine, lactulose, and oxyCODONE.  Meds ordered this encounter  Medications  . OxyCODONE (OXYCONTIN) 15 mg T12A 12 hr tablet    Sig: Take 1 tablet (15 mg total) by mouth every 12 (twelve) hours.    Dispense:  44 tablet    Refill:  0   His dose was decreased to 15 mg. This is slightly more than when he was taking before but much less than the dose that caused vomiting. I also decreased the number prescribed to the number of pills remaining in the bottle that he brought with him. This is also equal to the number that were disposed of by  staff.  Follow-up: Return in about 1 month (around 05/13/2015).  Claretta Fraise, M.D.

## 2015-04-15 NOTE — Telephone Encounter (Signed)
Will have to use short-acting oxycodone that he also has

## 2015-04-16 ENCOUNTER — Encounter: Payer: Self-pay | Admitting: Family Medicine

## 2015-04-17 ENCOUNTER — Telehealth: Payer: Self-pay | Admitting: Family Medicine

## 2015-04-17 NOTE — Telephone Encounter (Signed)
Oxycontin 30mg  controlled pain but caused vomiting. Because of this Dr Livia Snellen changed to Oxycontin 15mg  at most recent visit. This doesn't help the pain as much and it still causes nausea but no vomiting.  Dr Livia Snellen kept the Oxycontin 30mg  to dispose of.  He hasn't had any problems with Oxycodone IR 5mg  for breakthrough.

## 2015-04-18 ENCOUNTER — Telehealth: Payer: Self-pay | Admitting: Family Medicine

## 2015-04-18 MED ORDER — OXYCODONE HCL 10 MG PO TABS: ORAL_TABLET | ORAL | Status: DC

## 2015-04-18 NOTE — Telephone Encounter (Signed)
Discussed with Dr Sabra Heck. Patient switched back to Oxycodone 10mg  1 tablet every 4 hrs PRN pain. He is to come in next week for an appointment to discuss better pain management.  Patient agreeable to this. Given 30 tablets to last until appointment.

## 2015-04-18 NOTE — Telephone Encounter (Signed)
This was handled in another encounter.

## 2015-04-18 NOTE — Telephone Encounter (Signed)
Patient aware to use short acting oxycodone.

## 2015-04-24 ENCOUNTER — Encounter: Payer: Self-pay | Admitting: Family Medicine

## 2015-04-24 ENCOUNTER — Ambulatory Visit (INDEPENDENT_AMBULATORY_CARE_PROVIDER_SITE_OTHER): Payer: Medicare Other | Admitting: Family Medicine

## 2015-04-24 VITALS — BP 171/79 | HR 82 | Temp 96.1°F | Ht 66.0 in | Wt 312.0 lb

## 2015-04-24 DIAGNOSIS — E119 Type 2 diabetes mellitus without complications: Secondary | ICD-10-CM

## 2015-04-24 DIAGNOSIS — K7031 Alcoholic cirrhosis of liver with ascites: Secondary | ICD-10-CM

## 2015-04-24 MED ORDER — OXYCODONE HCL 10 MG PO TABS: ORAL_TABLET | ORAL | Status: DC

## 2015-04-24 NOTE — Progress Notes (Signed)
Subjective:    Patient ID: [name], male    DOB: 09-19-1960, 54 y.o.   MRN: [id number]  HPI  54 year old gentleman with liver failure, ascites, diabetes, and chronic pain. At his last visit I tried to put him on a long and short acting pain medicine since he was needing more of the short acting. Unfortunately, he did not tolerate OxyContin secondary to nausea and vomiting. Now we are back to his original medication which is oxycodone 10 mg 4 times a day and that seems to be controlling his pain. He still does not have much energy but I encouraged him to try and move as much as possible. Appetite is good. There is less edema than before; he is on combination of furosemide and spironolactone.      Patient Active Problem List   Diagnosis Date Noted  . Abnormal LFTs   . Scrotal edema 01/16/2015  . Portal venous hypertension   . Liver failure 01/15/2015  . Ascites 01/15/2015  . Traumatic ecchymosis of chest 10/26/2014  . Right upper quadrant abdominal pain   . Chronic hepatitis C with cirrhosis   . Diabetes 07/31/2014  . Encephalopathy, hepatic 01/29/2014  . Angiodysplasia of colon 12/27/2013  . Hematochezia 11/03/2013  . Other pancytopenia 02/18/2013  . Iron (Fe) deficiency anemia 02/18/2013  . Colon cancer screening 08/28/2011  . DM 02/15/2010  . GERD 10/25/2009  . Hepatic cirrhosis 10/25/2009  . ALCOHOL ABUSE 06/05/2009  . UNSPECIFIED DISEASE OF PANCREAS 06/05/2009   Outpatient Encounter Prescriptions as of 04/24/2015  Medication Sig  . albuterol (PROVENTIL HFA;VENTOLIN HFA) 108 (90 BASE) MCG/ACT inhaler Inhale 2 puffs into the lungs every 6 (six) hours as needed. Shortness of breath  . clotrimazole (LOTRIMIN) 1 % cream Apply 1 application topically 2 (two) times daily.  . cyclobenzaprine (FLEXERIL) 5 MG tablet Take 5 mg by mouth 3 (three) times daily as needed for muscle spasms.   . furosemide (LASIX) 20 MG tablet Take 20 mg by mouth at bedtime.   Marland Kitchen glipiZIDE (GLUCOTROL)  10 MG tablet Take 1 tablet (10 mg total) by mouth daily.  . hydrOXYzine (ATARAX/VISTARIL) 10 MG tablet Take 1 tablet (10 mg total) by mouth every 4 (four) hours as needed for itching.  . Insulin Glargine (LANTUS SOLOSTAR) 100 UNIT/ML Solostar Pen Inject 10 Units into the skin at bedtime.  Marland Kitchen lactulose (CHRONULAC) 10 GM/15ML solution Take 15 mLs (10 g total) by mouth 3 (three) times daily.  Marland Kitchen omeprazole (PRILOSEC) 20 MG capsule Take 2 capsules (40 mg total) by mouth daily.  . Oxycodone HCl 10 MG TABS Take 1 tab every 4 hours for pain  . rifaximin (XIFAXAN) 550 MG TABS tablet Take 1 tablet (550 mg total) by mouth 2 (two) times daily.  Marland Kitchen spironolactone (ALDACTONE) 25 MG tablet TAKE ON MONDAY-WEDNESDAY-FRIDAY   No facility-administered encounter medications on file as of 04/24/2015.       Review of Systems  Constitutional: Positive for fatigue.  HENT: Negative.   Respiratory: Negative.   Cardiovascular: Positive for leg swelling.  Gastrointestinal: Positive for abdominal distention.  Genitourinary: Negative.   Psychiatric/Behavioral: Negative.        Objective:   Physical Exam  Constitutional: He is oriented to person, place, and time. He appears well-developed and well-nourished.  Cardiovascular: Normal rate and regular rhythm.   Pulmonary/Chest: Effort normal and breath sounds normal.  Abdominal: Soft. There is tenderness.  Neurological: He is alert and oriented to person, place, and time.  Psychiatric: He  has a normal mood and affect. His behavior is normal.     BP 171/79 mmHg  Pulse 82  Temp(Src) 96.1 F (35.6 C) (Oral)  Ht 5\' 6"  (1.676 m)  Wt 312 lb (141.522 kg)  BMI 50.38 kg/m2      Assessment & Plan:   1. Alcoholic cirrhosis of liver with ascites Ration is not drinking but does have chronic pain seemingly related to liver disease and hep C. As stated above he is now back on oxycodone 10 mg 4 times a day which he tolerates. We will continue feeling this and do have a  drug contract.  2. Type 2 diabetes mellitus without complication Y3K is 6.4 we should check it again today but as stated previously his diabetes is fairly well controlled compared to all his other problems and pain.  [name] MD

## 2015-04-27 ENCOUNTER — Encounter (HOSPITAL_COMMUNITY): Payer: Medicare Other | Attending: Oncology

## 2015-04-27 ENCOUNTER — Ambulatory Visit (HOSPITAL_COMMUNITY): Payer: Medicare Other | Admitting: Hematology & Oncology

## 2015-04-27 DIAGNOSIS — S20219A Contusion of unspecified front wall of thorax, initial encounter: Secondary | ICD-10-CM | POA: Insufficient documentation

## 2015-04-27 DIAGNOSIS — D509 Iron deficiency anemia, unspecified: Secondary | ICD-10-CM | POA: Insufficient documentation

## 2015-04-27 DIAGNOSIS — D61818 Other pancytopenia: Secondary | ICD-10-CM | POA: Insufficient documentation

## 2015-05-01 ENCOUNTER — Telehealth: Payer: Self-pay | Admitting: Gastroenterology

## 2015-05-01 NOTE — Progress Notes (Signed)
This encounter was created in error - please disregard.

## 2015-05-01 NOTE — Telephone Encounter (Signed)
[name] (wife of patient) called to reschedule patient's OV and said that she is trying to get her finances straight due to changes in her insurance and was asking about getting samples of his liver medication (doesn't know the name). Please advise and call [phone]

## 2015-05-01 NOTE — Telephone Encounter (Signed)
Pt said his appt was changed. I have Xifaxan 550 mg # 6 packets ( tablets) at front for pick up. I asked him did they do the additional paperwork that they were supposed to do to get the assistance longer. He said he was not sure, but he will ask [name] when she returns.

## 2015-05-02 ENCOUNTER — Other Ambulatory Visit: Payer: Self-pay | Admitting: Family Medicine

## 2015-05-03 ENCOUNTER — Ambulatory Visit: Payer: Medicare Other | Admitting: Gastroenterology

## 2015-05-08 ENCOUNTER — Telehealth: Payer: Self-pay | Admitting: Family Medicine

## 2015-05-08 MED ORDER — OXYCODONE HCL 10 MG PO TABS: ORAL_TABLET | ORAL | Status: DC

## 2015-05-08 NOTE — Telephone Encounter (Signed)
Rx refilled per patient request 

## 2015-05-14 ENCOUNTER — Ambulatory Visit (HOSPITAL_COMMUNITY): Payer: Medicare Other | Admitting: Hematology & Oncology

## 2015-05-14 ENCOUNTER — Other Ambulatory Visit (HOSPITAL_COMMUNITY): Payer: Medicare Other

## 2015-05-15 ENCOUNTER — Other Ambulatory Visit: Payer: Self-pay

## 2015-05-15 MED ORDER — FUROSEMIDE 20 MG PO TABS: 20.0000 mg | ORAL_TABLET | Freq: Every day | ORAL | Status: DC

## 2015-05-16 ENCOUNTER — Ambulatory Visit: Payer: Medicare Other | Admitting: Gastroenterology

## 2015-05-21 ENCOUNTER — Other Ambulatory Visit (HOSPITAL_COMMUNITY): Payer: Medicare Other

## 2015-05-21 ENCOUNTER — Ambulatory Visit (HOSPITAL_COMMUNITY): Payer: Medicare Other | Admitting: Hematology & Oncology

## 2015-05-30 ENCOUNTER — Encounter (HOSPITAL_COMMUNITY): Payer: Self-pay | Admitting: Oncology

## 2015-05-30 DIAGNOSIS — Z91199 Patient's noncompliance with other medical treatment and regimen due to unspecified reason: Secondary | ICD-10-CM | POA: Insufficient documentation

## 2015-05-30 DIAGNOSIS — Z9119 Patient's noncompliance with other medical treatment and regimen: Secondary | ICD-10-CM | POA: Insufficient documentation

## 2015-05-30 HISTORY — DX: Patient's noncompliance with other medical treatment and regimen due to unspecified reason: Z91.199

## 2015-05-30 HISTORY — DX: Patient's noncompliance with other medical treatment and regimen: Z91.19

## 2015-05-30 NOTE — Progress Notes (Signed)
[name], [place] [id number]  Other pancytopenia [facility]) - Plan: CBC with Differential, Comprehensive metabolic panel  Iron (Fe) deficiency anemia - Plan: CBC with Differential, Iron and TIBC, Ferritin  Alcoholic cirrhosis of liver with ascites ([place])  Medically noncompliant  CURRENT THERAPY: Surveillance of blood counts and administration of IV iron as needed.  INTERVAL HISTORY: [name] 54 y.o. male returns for followup of pancytopenia secondary to cirrhosis of the liver with iron deficiency requiring intermittent IV iron. His cirrhosis of non-alcohol-related and he has been evaluated in [place], [place] for possible transplant. There is a history of left nephrolithiasis as well as cholelithiasis. Due to his severe underlying liver disease, cholecystectomy has not been done.   I personally reviewed and went over laboratory results with the patient.  The results are noted within this dictation.  Labs will be updated today.  Chart reviewed.  Noted are notes from [name] regarding his prognosis from liver disease.  Additionally, I have reviewed notes from his primary care provider(s).  He relayed his most recent hospitalization story to me.      Hematologically, he denies any complaints and ROS questioning is negative.   Past Medical History  Diagnosis Date  . Hypertension   . Diabetes mellitus   . GERD (gastroesophageal reflux disease)     DEC 2010 EGD/Bx REACTIVE GASTROPATHY, NO VARICES  . Hemorrhoids, internal   . BMI 40.0-44.9, adult ([place]) OCT 2010 269 LBS    APR 2012 279 LBS AUG 2014 185 LBS  . Cirrhosis ([place]) NOV 2010 CHILD PUGH A    ETOH/HCV/OBESITY  . IV drug abuse REMOTE  . Hepatitis 2010 HEP C    AST 509 ALT 267 ALK PHOS 165 ALB 3.8 NEG IGM HAV/HBSAg  . Gallstone AUG 2012 1 CM  . GERD 10/25/2009  . COPD (chronic obstructive pulmonary disease) ([place])   . Hepatitis C   . Pancytopenia ([place]) 2013  . Splenomegaly  2013  . Other pancytopenia ([place]) 02/18/2013  . Iron (Fe) deficiency anemia 02/18/2013  . Splenomegaly 02/18/2013  . Neuropathy ([place])   . Biliary dyskinesia JUL 2015    HIDA GB EF 5%  . Chronic pain   . Chronic leg pain     left  . Portal venous hypertension (HCC)   . Asthma   . Medically noncompliant 05/30/2015    has DM; ALCOHOL ABUSE; GERD; Hepatic cirrhosis ([place]); UNSPECIFIED DISEASE OF PANCREAS; Colon cancer screening; Other pancytopenia ([place]); Iron (Fe) deficiency anemia; Hematochezia; Angiodysplasia of colon; Encephalopathy, hepatic ([place]); Diabetes ([place]); Right upper quadrant abdominal pain; Chronic hepatitis C with cirrhosis ([place]); Traumatic ecchymosis of chest; Liver failure ([place]); Ascites; Scrotal edema; Portal venous hypertension ([place]); Abnormal LFTs; and Medically noncompliant on his problem list.     is allergic to penicillins.  Mr. [name] does not currently have medications on file.  Past Surgical History  Procedure Laterality Date  . Sigmoidoscopy      2001 DR. FLEISCHMAN INTERNAL HERMORRHOIDS  . Upper gastrointestinal endoscopy  DEC 2010    BENIGN POLYPS, GASTRITIS, ?phg  . Knee surgery  RIGHT  . Hemorrhoid surgery    . Esophageal biopsy  09/08/2011    [name]:Moderate gastritis/Polyps, multiple in the body of the stomach  . Colonoscopy with propofol N/A 11/15/2013    [name]: rectal varices, small AVMs  . Esophagogastroduodenoscopy (egd) with propofol N/A 11/15/2013    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 Next EGD in April 2016.      Denies any headaches, dizziness, double vision, fevers, chills, night sweats, nausea, vomiting, diarrhea, constipation, chest pain, heart palpitations, shortness of breath, blood in stool, black tarry stool, urinary pain, urinary burning, urinary frequency, hematuria.   PHYSICAL EXAMINATION  ECOG PERFORMANCE STATUS: 0 - Asymptomatic  Filed Vitals:   05/31/15 1137   BP: 159/72  Pulse: 85  Temp: 98.2 F (36.8 C)  Resp: 20    GENERAL:alert, no distress, well nourished, well developed, comfortable, cooperative, obese and smiling SKIN: skin color, texture, turgor are normal, no rashes or significant lesions HEAD: Normocephalic, No masses, lesions, tenderness or abnormalities EYES: normal, PERRLA, EOMI, Conjunctiva are pink and non-injected EARS: External ears normal OROPHARYNX:lips, buccal mucosa, and tongue normal and mucous membranes are moist  NECK: supple, no adenopathy, trachea midline LYMPH:  no palpable lymphadenopathy BREAST:right chest wall ecchymosis with swelling superior to right breast and lateral to sternum.  No paradoxical chest movement. LUNGS: clear to auscultation and percussion HEART: regular rate & rhythm, no gallops, S1 normal, S2 normal and 1/6 systolic ejection murmur heard best at LSB. ABDOMEN:abdomen soft, non-tender and obese BACK: Back symmetric, no curvature. EXTREMITIES:less then 2 second capillary refill, no joint deformities, effusion, or inflammation, no skin discoloration, no cyanosis  NEURO: alert & oriented x 3 with fluent speech, no focal motor/sensory deficits, gait normal   LABORATORY DATA: CBC    Component Value Date/Time   WBC 3.5* 05/31/2015 0908   WBC 3.7 04/10/2015 1609   RBC 3.57* 05/31/2015 0908   RBC 3.71* 04/10/2015 1609   HGB 11.5* 05/31/2015 0908   HCT 33.1* 05/31/2015 0908   HCT 35.7* 04/10/2015 1609   PLT 55* 05/31/2015 0908   MCV 92.7 05/31/2015 0908   MCH 32.2 05/31/2015 0908   MCH 32.6 04/10/2015 1609   MCHC 34.7 05/31/2015 0908   MCHC 33.9 04/10/2015 1609   RDW 13.8 05/31/2015 0908   RDW 13.8 04/10/2015 1609   LYMPHSABS 1.0 05/31/2015 0908   LYMPHSABS 1.0 04/10/2015 1609   MONOABS 0.3 05/31/2015 0908   EOSABS 0.1 05/31/2015 0908   BASOSABS 0.0 05/31/2015 0908   BASOSABS 0.0 04/10/2015 1609      Chemistry      Component Value Date/Time   NA 137 05/31/2015 0908   NA 141  04/10/2015 1609   K 4.3 05/31/2015 0908   CL 109 05/31/2015 0908   CO2 24 05/31/2015 0908   BUN 15 05/31/2015 0908   BUN 10 04/10/2015 1609   CREATININE 0.76 05/31/2015 0908   CREATININE 0.67 07/19/2012 0823      Component Value Date/Time   CALCIUM 8.5* 05/31/2015 0908   ALKPHOS 131* 05/31/2015 0908   AST 138* 05/31/2015 0908   ALT 76* 05/31/2015 0908   BILITOT 2.4* 05/31/2015 0908     Lab Results  Component Value Date   IRON 115 01/26/2015   TIBC 224* 01/26/2015   FERRITIN 227 01/26/2015     ASSESSMENT AND PLAN:  Other pancytopenia Pancytopenia secondary to cirrhosis of the liver with an element of iron deficiency requiring IV iron infrequently. His cirrhosis is of non-alcohol-related and he has been evaluated in [place], [place] for possible transplant.    Labs today: CBC diff, CMET, iron/TIBC, ferritin  Labs in 6 months: CBC diff, CMET, iron/TIBC, ferritin  Return in 6 months for follow-up.  Iron (Fe) deficiency anemia Requiring infrequent IV iron.  Labs today and  in 6 months: iron/TIBC, ferritin.  Hepatic cirrhosis Given GI diagnosis and prognosis, Mr. Castagna would benefit from establishment of code status and discussion regarding goals of care.  I discussed Full Code, Partial/Limited Code, and DNR with the patient.  He will consider his options.  I will give him a MOST form to review and consider.  This can be reviewed with GI and/or his primary care provider.  His prognosis is poor from a GI perspective.  He notes that his GI physician in [place] told him that he has months- 1 year of life.  Mr. Clutter was upset about that and therefore, his wife contacted [name] about this information.  The patient's wife reports that conversation with Dr Oneida Alar was more promising and hopeful which helped Mr. Mangino spirits.  He would benefit from goals of care discussion.  If hospitalized in the future, he would benefit from palliative consult/intervention.  He has  an appointment later today with [name] for follow-up.  Medically noncompliant Multiple missed and rescheduled appointments.  Poor GI compliance.  H/O of cocaine abuse.  THERAPY PLAN:  We will continue to monitor labs and provide iron as indicated.    All questions were answered. The patient knows to call the clinic with any problems, questions or concerns. We can certainly see the patient much sooner if necessary.  Patient and plan discussed with Dr. Ancil Linsey and she is in agreement with the aforementioned.   Exam room entered with the patient at 12:07PM.  Exit was at 12:33 PM  This note is electronically signed by: [facility] 05/31/2015 12:45 PM

## 2015-05-30 NOTE — Assessment & Plan Note (Addendum)
Given GI diagnosis and prognosis, [name] would benefit from establishment of code status and discussion regarding goals of care.  I discussed Full Code, Partial/Limited Code, and DNR with the patient.  [name] will consider his options.  I will give him a MOST form to review and consider.  This can be reviewed with GI and/or his primary care provider.  His prognosis is poor from a GI perspective.  [name] notes that his GI physician in [place] told him that [name] has months- 1 year of life.  [name] was upset about that and therefore, his wife contacted [name] about this information.  The patient's wife reports that conversation with Dr Oneida Alar was more promising and hopeful which helped [name] spirits.  [name] would benefit from goals of care discussion.  If hospitalized in the future, [name] would benefit from palliative consult/intervention.  [name] has an appointment later today with [name] for follow-up.

## 2015-05-30 NOTE — Assessment & Plan Note (Signed)
Requiring infrequent IV iron.  Labs today and in 6 months: iron/TIBC, ferritin.

## 2015-05-30 NOTE — Assessment & Plan Note (Signed)
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 His cirrhosis is of non-alcohol-related and he has been evaluated in [place], [place] for possible transplant.    Labs today: CBC diff, CMET, iron/TIBC, ferritin  Labs in 6 months: CBC diff, CMET, iron/TIBC, ferritin  Return in 6 months for follow-up.

## 2015-05-30 NOTE — Assessment & Plan Note (Addendum)
Multiple missed and rescheduled appointments.  Poor GI compliance.  H/O of cocaine abuse.

## 2015-05-31 ENCOUNTER — Encounter (HOSPITAL_COMMUNITY): Payer: Medicare Other | Attending: Oncology

## 2015-05-31 ENCOUNTER — Encounter: Payer: Self-pay | Admitting: Gastroenterology

## 2015-05-31 ENCOUNTER — Encounter (HOSPITAL_COMMUNITY): Payer: Medicare Other | Attending: Oncology | Admitting: Oncology

## 2015-05-31 ENCOUNTER — Ambulatory Visit (INDEPENDENT_AMBULATORY_CARE_PROVIDER_SITE_OTHER): Payer: Medicare Other | Admitting: Gastroenterology

## 2015-05-31 ENCOUNTER — Other Ambulatory Visit: Payer: Self-pay

## 2015-05-31 ENCOUNTER — Other Ambulatory Visit: Payer: Self-pay | Admitting: Family Medicine

## 2015-05-31 ENCOUNTER — Encounter (HOSPITAL_COMMUNITY): Payer: Self-pay | Admitting: Oncology

## 2015-05-31 VITALS — BP 159/72 | HR 85 | Temp 98.2°F | Resp 20 | Wt 319.0 lb

## 2015-05-31 VITALS — BP 169/81 | HR 85 | Temp 97.9°F | Ht 72.0 in | Wt 320.0 lb

## 2015-05-31 DIAGNOSIS — D509 Iron deficiency anemia, unspecified: Secondary | ICD-10-CM | POA: Insufficient documentation

## 2015-05-31 DIAGNOSIS — S20219A Contusion of unspecified front wall of thorax, initial encounter: Secondary | ICD-10-CM | POA: Insufficient documentation

## 2015-05-31 DIAGNOSIS — I8510 Secondary esophageal varices without bleeding: Secondary | ICD-10-CM

## 2015-05-31 DIAGNOSIS — K7290 Hepatic failure, unspecified without coma: Secondary | ICD-10-CM

## 2015-05-31 DIAGNOSIS — B182 Chronic viral hepatitis C: Secondary | ICD-10-CM | POA: Diagnosis not present

## 2015-05-31 DIAGNOSIS — K7030 Alcoholic cirrhosis of liver without ascites: Secondary | ICD-10-CM

## 2015-05-31 DIAGNOSIS — N5089 Other specified disorders of the male genital organs: Secondary | ICD-10-CM | POA: Diagnosis not present

## 2015-05-31 DIAGNOSIS — D61818 Other pancytopenia: Secondary | ICD-10-CM

## 2015-05-31 DIAGNOSIS — K7460 Unspecified cirrhosis of liver: Secondary | ICD-10-CM | POA: Diagnosis not present

## 2015-05-31 DIAGNOSIS — K7031 Alcoholic cirrhosis of liver with ascites: Secondary | ICD-10-CM | POA: Diagnosis not present

## 2015-05-31 DIAGNOSIS — K7682 Hepatic encephalopathy: Secondary | ICD-10-CM

## 2015-05-31 DIAGNOSIS — Z9119 Patient's noncompliance with other medical treatment and regimen: Secondary | ICD-10-CM

## 2015-05-31 DIAGNOSIS — I8500 Esophageal varices without bleeding: Secondary | ICD-10-CM | POA: Insufficient documentation

## 2015-05-31 DIAGNOSIS — Z91199 Patient's noncompliance with other medical treatment and regimen due to unspecified reason: Secondary | ICD-10-CM

## 2015-05-31 DIAGNOSIS — N5082 Scrotal pain: Secondary | ICD-10-CM

## 2015-05-31 LAB — COMPREHENSIVE METABOLIC PANEL
ALBUMIN: 2.7 g/dL — AB (ref 3.5–5.0)
ALK PHOS: 131 U/L — AB (ref 38–126)
ALT: 76 U/L — AB (ref 17–63)
AST: 138 U/L — ABNORMAL HIGH (ref 15–41)
Anion gap: 4 — ABNORMAL LOW (ref 5–15)
BUN: 15 mg/dL (ref 6–20)
CALCIUM: 8.5 mg/dL — AB (ref 8.9–10.3)
CO2: 24 mmol/L (ref 22–32)
CREATININE: 0.76 mg/dL (ref 0.61–1.24)
Chloride: 109 mmol/L (ref 101–111)
GFR calc Af Amer: >60 mL/min (ref >60)
GFR calc non Af Amer: >60 mL/min (ref >60)
GLUCOSE: 258 mg/dL — AB (ref 65–99)
Potassium: 4.3 mmol/L (ref 3.5–5.1)
SODIUM: 137 mmol/L (ref 135–145)
Total Bilirubin: 2.4 mg/dL — ABNORMAL HIGH (ref 0.3–1.2)
Total Protein: 6.5 g/dL (ref 6.5–8.1)

## 2015-05-31 LAB — CBC WITH DIFFERENTIAL/PLATELET
BASOS PCT: 0 %
Basophils Absolute: 0 10*3/uL (ref 0.0–0.1)
EOS ABS: 0.1 10*3/uL (ref 0.0–0.7)
Eosinophils Relative: 3 %
HEMATOCRIT: 33.1 % — AB (ref 39.0–52.0)
HEMOGLOBIN: 11.5 g/dL — AB (ref 13.0–17.0)
LYMPHS ABS: 1 10*3/uL (ref 0.7–4.0)
Lymphocytes Relative: 27 %
MCH: 32.2 pg (ref 26.0–34.0)
MCHC: 34.7 g/dL (ref 30.0–36.0)
MCV: 92.7 fL (ref 78.0–100.0)
Monocytes Absolute: 0.3 10*3/uL (ref 0.1–1.0)
Monocytes Relative: 9 %
NEUTROS ABS: 2.1 10*3/uL (ref 1.7–7.7)
NEUTROS PCT: 60 %
Platelets: 55 10*3/uL — ABNORMAL LOW (ref 150–400)
RBC: 3.57 MIL/uL — AB (ref 4.22–5.81)
RDW: 13.8 % (ref 11.5–15.5)
WBC: 3.5 10*3/uL — AB (ref 4.0–10.5)

## 2015-05-31 LAB — IRON AND TIBC
Iron: 110 ug/dL (ref 45–182)
Saturation Ratios: 41 % — ABNORMAL HIGH (ref 17.9–39.5)
TIBC: 269 ug/dL (ref 250–450)
UIBC: 159 ug/dL

## 2015-05-31 LAB — FERRITIN: Ferritin: 110 ng/mL (ref 24–336)

## 2015-05-31 NOTE — Progress Notes (Signed)
ON RECALL  °

## 2015-05-31 NOTE — Patient Instructions (Addendum)
CONTINUE YOUR WEIGHT LOSS EFFORTS.  Lose 10 lbs.  Take Lasix 40 mg with Aldactone 10 mg daily.  REPEAT LAB NEXT FRI.  FOLLOW UP IN 3 MOS.   COMPLETE YOUR HEPATITIS A & B VACCINE AT DR. [facility]ILLER[facility]S [facility].  SEE UROLOGY FOR YOUR MALE PART PROBLEMS.

## 2015-05-31 NOTE — Assessment & Plan Note (Addendum)
WELL COMPENSATED DISEASE-edema not controlled.  CONTINUE YOUR WEIGHT LOSS EFFORTS.  Lose 10 lbs. Take Lasix 40 mg with Aldactone 10 mg daily. REPEAT LABs NEXT FRI. FOLLOW UP IN 3 MOS.  SEE UROLOGY FOR YOUR MALE PART PROBLEMS. NEEDS HEP A/B SERIES FLU SHOT UTD

## 2015-05-31 NOTE — Assessment & Plan Note (Addendum)
DISCUSSED WITH PT AND WIFE IF HE NEEDS A LIVER TRANSPLANT HE WILL NEED TO LOSE WEIGHT & THAT EXTRA WEIGHT/FAT ARE NOT GOOD FOR HIS LIVER.

## 2015-05-31 NOTE — Progress Notes (Signed)
LABS DRAWN

## 2015-05-31 NOTE — Assessment & Plan Note (Signed)
CREATING DIFFICULTY WITH URINATION.  REFER TO UROLOGY.

## 2015-05-31 NOTE — Telephone Encounter (Signed)
Patient aware that it is to early for his pain medication.

## 2015-05-31 NOTE — Assessment & Plan Note (Signed)
SYMPTOMS CONTROLLED/RESOLVED ON LACTULOSE. HAVING TROUBLE GETTING XIFAXIN.  WILL ASSIST WIT PAPERWORK FOR XIFAXIN. CONTINUE LACTULOSE. FOLLOW UP IN 3 MOS.

## 2015-05-31 NOTE — Progress Notes (Signed)
CC'D TO PCP °

## 2015-05-31 NOTE — Patient Instructions (Addendum)
[place] at [facility] Discharge Instructions  RECOMMENDATIONS MADE BY THE CONSULTANT AND ANY TEST RESULTS WILL BE SENT TO YOUR REFERRING PHYSICIAN.  Labs every 6 months Return in 6 months  Thank you for choosing [place] at [facility] to provide your oncology and hematology care.  To afford each patient quality time with our provider, please arrive at least 15 minutes before your scheduled appointment time.    You need to re-schedule your appointment should you arrive 10 or more minutes late.  We strive to give you quality time with our providers, and arriving late affects you and other patients whose appointments are after yours.  Also, if you no show three or more times for appointments you may be dismissed from the clinic at the providers discretion.     Again, thank you for choosing [facility].  Our hope is that these requests will decrease the amount of time that you wait before being seen by our physicians.       _____________________________________________________________  Should you have questions after your visit to [facility], please contact our office at [phone] between the hours of 8:30 a.m. and 4:30 p.m.  Voicemails left after 4:30 p.m. will not be returned until the following business day.  For prescription refill requests, have your pharmacy contact our office.

## 2015-05-31 NOTE — Assessment & Plan Note (Signed)
Due to cirrhosis-GRADE 1 IN MAY 2015.  SCHEDULE NEXT EGD AFTER OPV IN 3 MOS

## 2015-05-31 NOTE — Progress Notes (Signed)
Subjective:    Patient ID: [name], male    DOB: 1961-07-01, 54 y.o.   MRN: [id number]  [name], MD  HPI TRIED TO GET RID OF HEP C AND IT DIDN'T WORK. WONDERS ABOUT LIVER TRANSPLANT. WENT TO CANCER DOCTOR TODAY. GAINED 25 LBS SINCE MAY 2016.   TROUBLE GETTING XIFAXIN. BM: 3X/DAY(SOFT). NEVER HAD HEP A OR B. HAS HAD BLEEDING FROM GROIN IN PAST. SOB WITH LAYING DOWN OR WALKING SHORT DISTANCE. RARE PROBLEMS WITH SWALLOWING: PILLS AND FOOD. JUST HAD A NEW GRANDBABY IN [place].  PT DENIES FEVER, CHILLS, HEMATOCHEZIA,  nausea, vomiting, melena, diarrhea, CHEST PAIN, CHANGE IN BOWEL IN HABITS, constipation, abdominal pain, OR heartburn or indigestion.  Past Medical History  Diagnosis Date  . Hypertension   . Diabetes mellitus   . GERD (gastroesophageal reflux disease)     DEC 2010 EGD/Bx REACTIVE GASTROPATHY, NO VARICES  . Hemorrhoids, internal   . BMI 40.0-44.9, adult ([place]) OCT 2010 269 LBS    APR 2012 279 LBS AUG 2014 185 LBS  . Cirrhosis ([place]) NOV 2010 CHILD PUGH A    ETOH/HCV/OBESITY  . IV drug abuse REMOTE  . Hepatitis 2010 HEP C    AST 509 ALT 267 ALK PHOS 165 ALB 3.8 NEG IGM HAV/HBSAg  . Gallstone AUG 2012 1 CM  . GERD 10/25/2009  . COPD (chronic obstructive pulmonary disease) ([place])   . Hepatitis C   . Pancytopenia ([place]) 2013  . Splenomegaly 2013  . Other pancytopenia ([place]) 02/18/2013  . Iron (Fe) deficiency anemia 02/18/2013  . Splenomegaly 02/18/2013  . Neuropathy ([place])   . Biliary dyskinesia JUL 2015    HIDA GB EF 5%  . Chronic pain   . Chronic leg pain     left  . Portal venous hypertension (HCC)   . Asthma   . Medically noncompliant 05/30/2015   Past Surgical History  Procedure Laterality Date  . Sigmoidoscopy      2001 DR. FLEISCHMAN INTERNAL HERMORRHOIDS  . Upper gastrointestinal endoscopy  DEC 2010    BENIGN POLYPS, GASTRITIS, ?phg  . Knee surgery  RIGHT  . Hemorrhoid surgery    . Esophageal biopsy  09/08/2011    [name]:Moderate  gastritis/Polyps, multiple in the body of the stomach  . Colonoscopy with propofol N/A 11/15/2013    [name]: rectal varices, small AVMs  . Esophagogastroduodenoscopy (egd) with propofol N/A 11/15/2013    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 Next EGD in April 2016.     Allergies  Allergen Reactions  . Penicillins Hives   Current Outpatient Prescriptions  Medication Sig Dispense Refill  . albuterol (PROVENTIL HFA;VENTOLIN HFA) 108 (90 BASE) MCG/ACT inhaler Inhale 2 puffs into the lungs every 6 (six) hours as needed. Shortness of breath    . clotrimazole (LOTRIMIN) 1 % cream Apply 1 application topically 2 (two) times daily.    . cyclobenzaprine (FLEXERIL) 5 MG tablet Take 5 mg by mouth 3 (three) times daily as needed for muscle spasms.     . furosemide (LASIX) 20 MG tablet Take 1 tablet (20 mg total) by mouth at bedtime.    Marland Kitchen glipiZIDE (GLUCOTROL) 10 MG tablet Take 1 tablet (10 mg total) by mouth daily.    . hydrOXYzine (ATARAX/VISTARIL) 10 MG tablet TAKE 1 TABLET  EVERY 4 HOURS PRN FOR ITCHING.    . Insulin Glargine (LANTUS SOLOSTAR) 100 UNIT/ML Solostar Pen Inject 10 Units into the  skin at bedtime.    Marland Kitchen lactulose (CHRONULAC) 10 GM/15ML solution Take 15 mLs (10 g total) by mouth 3 (three) times daily. (Patient taking differently: Take 10 g by mouth daily. )    . omeprazole (PRILOSEC) 20 MG capsule Take 2 capsules (40 mg total) by mouth daily.    . Oxycodone HCl 10 MG TABS Take 1 tab every 4 hours for pain    . spironolactone (ALDACTONE) 25 MG tablet TAKE ON MONDAY-WEDNESDAY-FRIDAY    .       Review of Systems PER HPI OTHERWISE ALL SYSTEMS ARE NEGATIVE.    Objective:   Physical Exam  Constitutional: He is oriented to person, place, and time. He appears well-developed and well-nourished. No distress.  HENT:  Head: Normocephalic and atraumatic.  Mouth/Throat: Oropharynx is clear and moist. No oropharyngeal exudate.    Eyes: Pupils are equal, round, and reactive to light. No scleral icterus.  Neck: Normal range of motion. Neck supple.  Cardiovascular: Normal rate, regular rhythm and normal heart sounds.   Pulmonary/Chest: Effort normal and breath sounds normal. No respiratory distress.  Abdominal: Soft. Bowel sounds are normal. He exhibits no distension. There is tenderness. There is no rebound and no guarding.  Obese, MILD RUQ/RLQ TTP   Musculoskeletal: He exhibits no edema.  Lymphadenopathy:    He has no cervical adenopathy.  Neurological: He is alert and oriented to person, place, and time.  NO FOCAL DEFICITs  Psychiatric:  FLAT AFFECT, SLIGHTLY ANXIOUS MOOD  Vitals reviewed.         Assessment & Plan:

## 2015-06-01 ENCOUNTER — Telehealth: Payer: Self-pay | Admitting: Family Medicine

## 2015-06-01 ENCOUNTER — Other Ambulatory Visit (HOSPITAL_COMMUNITY): Payer: Self-pay | Admitting: Oncology

## 2015-06-01 DIAGNOSIS — D509 Iron deficiency anemia, unspecified: Secondary | ICD-10-CM

## 2015-06-03 ENCOUNTER — Other Ambulatory Visit: Payer: Self-pay | Admitting: Family Medicine

## 2015-06-04 ENCOUNTER — Telehealth: Payer: Self-pay | Admitting: Gastroenterology

## 2015-06-04 NOTE — Telephone Encounter (Signed)
Please give patient enough pain medicine until it can be refilled by Dr. Sabra Heck

## 2015-06-04 NOTE — Telephone Encounter (Signed)
Called pt back about referral appt

## 2015-06-04 NOTE — Telephone Encounter (Signed)
Returning call. Please call patient back.

## 2015-06-04 NOTE — Telephone Encounter (Signed)
Patient called stating he needed a refill on oxycodone. Last seen 9/13 last filled 9/27

## 2015-06-05 ENCOUNTER — Telehealth: Payer: Self-pay | Admitting: Gastroenterology

## 2015-06-05 NOTE — Telephone Encounter (Signed)
Wife was made aware

## 2015-06-05 NOTE — Telephone Encounter (Signed)
Pt's wife called to get the name of the doctor he was referred to. She knows the office is across the street from [facility]. I gave her the number to call and told her they would give a listings of the doctors there. I didn't know the name of the doctor just that he was going to see a urologist. Please call [name] (wife) back at [phone]

## 2015-06-06 ENCOUNTER — Telehealth: Payer: Self-pay | Admitting: Gastroenterology

## 2015-06-06 NOTE — Telephone Encounter (Signed)
REVIEWED-NO ADDITIONAL RECOMMENDATIONS. 

## 2015-06-06 NOTE — Telephone Encounter (Signed)
I  Called pt's wife. She will try to hurry and do the paperwork for assistance for the Xifaxan. Samples given #20 tablets to take one tablet bid.

## 2015-06-06 NOTE — Telephone Encounter (Signed)
Pt's wife (gail) called to say patient is cramping really bad and she hasn't filled out his papers yet to get assistance with his medication. She is asking if we have any samples of Xifaxin. Patient is to stop by on Friday. Please advise

## 2015-06-07 ENCOUNTER — Encounter: Payer: Self-pay | Admitting: Family Medicine

## 2015-06-07 ENCOUNTER — Ambulatory Visit (INDEPENDENT_AMBULATORY_CARE_PROVIDER_SITE_OTHER): Payer: Medicare Other | Admitting: Family Medicine

## 2015-06-07 VITALS — BP 155/79 | HR 116 | Temp 98.1°F

## 2015-06-07 DIAGNOSIS — K7290 Hepatic failure, unspecified without coma: Secondary | ICD-10-CM | POA: Diagnosis not present

## 2015-06-07 DIAGNOSIS — K7682 Hepatic encephalopathy: Secondary | ICD-10-CM

## 2015-06-07 DIAGNOSIS — K7031 Alcoholic cirrhosis of liver with ascites: Secondary | ICD-10-CM | POA: Diagnosis not present

## 2015-06-07 DIAGNOSIS — Z23 Encounter for immunization: Secondary | ICD-10-CM | POA: Diagnosis not present

## 2015-06-07 MED ORDER — OXYCODONE HCL 10 MG PO TABS: ORAL_TABLET | ORAL | Status: DC

## 2015-06-07 NOTE — Progress Notes (Signed)
Subjective:    Patient ID: [name], male    DOB: 1960/08/26, 54 y.o.   MRN: [id number]  HPI 54 year old gentleman with chronic liver disease followed by GI. He also has diabetes which has been well controlled and chronic pain which is controlled with oxycodone. More recently he is having problems with cramping in his legs and edema probably the result of increased ascites and a gravitational effect. This is also apparently affecting his scrotum and foreskin and he is having issues there for which urology appointment has already been set up. He was told to take Spiriva lactone every day by his GI doctor for the edema. There does not seem to be any increased confusion but he is having problem with balance and indeed fell in the parking lot here today. He has been taking extra muscle relaxants as many as 5 Flexeril at a time for his leg cramps. This does not help and I cautioned him about use of excess medicine in view of his liver disease.    Review of Systems  Constitutional: Positive for unexpected weight change.  HENT: Negative.   Respiratory: Positive for shortness of breath.   Cardiovascular: Positive for leg swelling.  Genitourinary: Positive for penile swelling.  Neurological: Negative.   Psychiatric/Behavioral: Negative.    Patient Active Problem List   Diagnosis Date Noted  . Esophageal varices ([place]) 05/31/2015  . Obesity, Class III, BMI 40-49.9 (morbid obesity) ([place]) 05/31/2015  . Medically noncompliant 05/30/2015  . Abnormal LFTs   . Scrotal edema 01/16/2015  . Ascites 01/15/2015  . Traumatic ecchymosis of chest 10/26/2014  . Chronic hepatitis C with cirrhosis ([place])   . Diabetes ([place]) 07/31/2014  . Encephalopathy, hepatic ([place]) 01/29/2014  . Angiodysplasia of colon 12/27/2013  . Hematochezia 11/03/2013  . Other pancytopenia ([place]) 02/18/2013  . Iron (Fe) deficiency anemia 02/18/2013  . Colon cancer screening 08/28/2011  . DM 02/15/2010  . GERD 10/25/2009  .  Hepatic cirrhosis ([place]) 10/25/2009  . ALCOHOL ABUSE 06/05/2009  . UNSPECIFIED DISEASE OF PANCREAS 06/05/2009   Outpatient Encounter Prescriptions as of 06/07/2015  Medication Sig  . albuterol (PROVENTIL HFA;VENTOLIN HFA) 108 (90 BASE) MCG/ACT inhaler Inhale 2 puffs into the lungs every 6 (six) hours as needed. Shortness of breath  . clotrimazole (LOTRIMIN) 1 % cream Apply 1 application topically 2 (two) times daily.  . cyclobenzaprine (FLEXERIL) 5 MG tablet Take 5 mg by mouth 3 (three) times daily as needed for muscle spasms.   . furosemide (LASIX) 20 MG tablet Take 1 tablet (20 mg total) by mouth at bedtime.  Marland Kitchen glipiZIDE (GLUCOTROL) 10 MG tablet TAKE 1 TABLET (10 MG TOTAL) BY MOUTH DAILY.  . hydrOXYzine (ATARAX/VISTARIL) 10 MG tablet TAKE 1 TABLET (10 MG TOTAL) BY MOUTH EVERY 4 (FOUR) HOURS AS NEEDED FOR ITCHING.  . Insulin Glargine (LANTUS SOLOSTAR) 100 UNIT/ML Solostar Pen Inject 10 Units into the skin at bedtime.  Marland Kitchen lactulose (CHRONULAC) 10 GM/15ML solution Take 15 mLs (10 g total) by mouth 3 (three) times daily. (Patient taking differently: Take 10 g by mouth daily. )  . omeprazole (PRILOSEC) 20 MG capsule Take 2 capsules (40 mg total) by mouth daily.  . Oxycodone HCl 10 MG TABS Take 1 tab every 4 hours for pain  . rifaximin (XIFAXAN) 550 MG TABS tablet Take 1 tablet (550 mg total) by mouth 2 (two) times daily.  Marland Kitchen spironolactone (ALDACTONE) 25 MG tablet TAKE ON MONDAY-WEDNESDAY-FRIDAY  . [DISCONTINUED] Oxycodone HCl 10 MG TABS Take 1 tab  every 4 hours for pain   No facility-administered encounter medications on file as of 06/07/2015.       Objective:   Physical Exam  Constitutional: He is oriented to person, place, and time. He appears well-developed and well-nourished.  Cardiovascular: Normal rate and regular rhythm.   Pulmonary/Chest: Effort normal and breath sounds normal.  Patient appears dyspneic likely related to swelling in his abdomen  Abdominal:  Increasing abdominal girth  with palpable fluid wave and increased swelling in legs  Musculoskeletal: He exhibits edema (both legs are 3+ swollen and firm and cramping during exam).  Neurological: He is oriented to person, place, and time.  Psychiatric: He has a normal mood and affect. His behavior is normal.          Assessment & Plan:  1. Alcoholic cirrhosis of liver with ascites ([place]) This is followed by GI who are monitoring his liver functions.  2. Encephalopathy, hepatic (HCC) 10 use with lactulose. There does not seem to be any confusion today but some office staff thought he is "acting differently."  [name] MD  Notice: Thisdictation was prepared with Dragon dictation along with smaller phrase technology. Any transcriptional errors that result from this process are unintentional and may not be corrected upon review

## 2015-06-08 ENCOUNTER — Other Ambulatory Visit: Payer: Self-pay | Admitting: Nurse Practitioner

## 2015-06-08 ENCOUNTER — Telehealth: Payer: Self-pay | Admitting: Nurse Practitioner

## 2015-06-08 MED ORDER — LACTULOSE 10 GM/15ML PO SOLN: ORAL | Status: DC

## 2015-06-08 NOTE — Telephone Encounter (Signed)
Done again

## 2015-06-11 ENCOUNTER — Emergency Department (HOSPITAL_COMMUNITY): Payer: Medicare Other

## 2015-06-11 ENCOUNTER — Inpatient Hospital Stay (HOSPITAL_COMMUNITY)
Admission: EM | Admit: 2015-06-11 | Discharge: 2015-06-26 | DRG: 603 | Disposition: A | Discharge status: Home Health | Payer: Medicare Other | Attending: Internal Medicine | Admitting: Internal Medicine

## 2015-06-11 ENCOUNTER — Telehealth: Payer: Self-pay | Admitting: Gastroenterology

## 2015-06-11 ENCOUNTER — Encounter (HOSPITAL_COMMUNITY): Payer: Self-pay | Admitting: Emergency Medicine

## 2015-06-11 DIAGNOSIS — I10 Essential (primary) hypertension: Secondary | ICD-10-CM | POA: Diagnosis present

## 2015-06-11 DIAGNOSIS — D696 Thrombocytopenia, unspecified: Secondary | ICD-10-CM | POA: Diagnosis not present

## 2015-06-11 DIAGNOSIS — R599 Enlarged lymph nodes, unspecified: Secondary | ICD-10-CM

## 2015-06-11 DIAGNOSIS — F17210 Nicotine dependence, cigarettes, uncomplicated: Secondary | ICD-10-CM | POA: Diagnosis present

## 2015-06-11 DIAGNOSIS — K56609 Unspecified intestinal obstruction, unspecified as to partial versus complete obstruction: Secondary | ICD-10-CM

## 2015-06-11 DIAGNOSIS — D899 Disorder involving the immune mechanism, unspecified: Secondary | ICD-10-CM | POA: Diagnosis present

## 2015-06-11 DIAGNOSIS — Z794 Long term (current) use of insulin: Secondary | ICD-10-CM | POA: Diagnosis not present

## 2015-06-11 DIAGNOSIS — N179 Acute kidney failure, unspecified: Secondary | ICD-10-CM | POA: Diagnosis present

## 2015-06-11 DIAGNOSIS — Z6841 Body Mass Index (BMI) 40.0 and over, adult: Secondary | ICD-10-CM

## 2015-06-11 DIAGNOSIS — L03115 Cellulitis of right lower limb: Principal | ICD-10-CM

## 2015-06-11 DIAGNOSIS — B182 Chronic viral hepatitis C: Secondary | ICD-10-CM | POA: Diagnosis present

## 2015-06-11 DIAGNOSIS — E875 Hyperkalemia: Secondary | ICD-10-CM | POA: Diagnosis not present

## 2015-06-11 DIAGNOSIS — K219 Gastro-esophageal reflux disease without esophagitis: Secondary | ICD-10-CM | POA: Diagnosis present

## 2015-06-11 DIAGNOSIS — E669 Obesity, unspecified: Secondary | ICD-10-CM | POA: Diagnosis present

## 2015-06-11 DIAGNOSIS — J449 Chronic obstructive pulmonary disease, unspecified: Secondary | ICD-10-CM | POA: Diagnosis present

## 2015-06-11 DIAGNOSIS — K7460 Unspecified cirrhosis of liver: Secondary | ICD-10-CM | POA: Diagnosis present

## 2015-06-11 DIAGNOSIS — Z809 Family history of malignant neoplasm, unspecified: Secondary | ICD-10-CM | POA: Diagnosis not present

## 2015-06-11 DIAGNOSIS — N50819 Testicular pain, unspecified: Secondary | ICD-10-CM

## 2015-06-11 DIAGNOSIS — K7031 Alcoholic cirrhosis of liver with ascites: Secondary | ICD-10-CM

## 2015-06-11 DIAGNOSIS — L0390 Cellulitis, unspecified: Secondary | ICD-10-CM | POA: Diagnosis present

## 2015-06-11 DIAGNOSIS — K567 Ileus, unspecified: Secondary | ICD-10-CM | POA: Diagnosis present

## 2015-06-11 DIAGNOSIS — E871 Hypo-osmolality and hyponatremia: Secondary | ICD-10-CM | POA: Diagnosis not present

## 2015-06-11 DIAGNOSIS — Z9119 Patient's noncompliance with other medical treatment and regimen: Secondary | ICD-10-CM

## 2015-06-11 DIAGNOSIS — L03314 Cellulitis of groin: Secondary | ICD-10-CM | POA: Diagnosis present

## 2015-06-11 DIAGNOSIS — K745 Biliary cirrhosis, unspecified: Secondary | ICD-10-CM | POA: Diagnosis not present

## 2015-06-11 DIAGNOSIS — K7030 Alcoholic cirrhosis of liver without ascites: Secondary | ICD-10-CM | POA: Diagnosis present

## 2015-06-11 DIAGNOSIS — E119 Type 2 diabetes mellitus without complications: Secondary | ICD-10-CM | POA: Diagnosis present

## 2015-06-11 DIAGNOSIS — R109 Unspecified abdominal pain: Secondary | ICD-10-CM

## 2015-06-11 LAB — CBC WITH DIFFERENTIAL/PLATELET
Basophils Absolute: 0 10*3/uL (ref 0.0–0.1)
Basophils Relative: 0 %
EOS PCT: 2 %
Eosinophils Absolute: 0.1 10*3/uL (ref 0.0–0.7)
HCT: 36.1 % — ABNORMAL LOW (ref 39.0–52.0)
HEMOGLOBIN: 12.2 g/dL — AB (ref 13.0–17.0)
LYMPHS ABS: 0.8 10*3/uL (ref 0.7–4.0)
LYMPHS PCT: 13 %
MCH: 32.2 pg (ref 26.0–34.0)
MCHC: 33.8 g/dL (ref 30.0–36.0)
MCV: 95.3 fL (ref 78.0–100.0)
Monocytes Absolute: 0.8 10*3/uL (ref 0.1–1.0)
Monocytes Relative: 13 %
NEUTROS PCT: 71 %
Neutro Abs: 4.4 10*3/uL (ref 1.7–7.7)
Platelets: 78 10*3/uL — ABNORMAL LOW (ref 150–400)
RBC: 3.79 MIL/uL — AB (ref 4.22–5.81)
RDW: 14.2 % (ref 11.5–15.5)
WBC: 6.2 10*3/uL (ref 4.0–10.5)

## 2015-06-11 LAB — COMPREHENSIVE METABOLIC PANEL
ALK PHOS: 154 U/L — AB (ref 38–126)
ALT: 106 U/L — AB (ref 17–63)
AST: 218 U/L — ABNORMAL HIGH (ref 15–41)
Albumin: 2.5 g/dL — ABNORMAL LOW (ref 3.5–5.0)
Anion gap: 6 (ref 5–15)
BUN: 12 mg/dL (ref 6–20)
CALCIUM: 8.1 mg/dL — AB (ref 8.9–10.3)
CO2: 25 mmol/L (ref 22–32)
CREATININE: 0.94 mg/dL (ref 0.61–1.24)
Chloride: 103 mmol/L (ref 101–111)
Glucose, Bld: 387 mg/dL — ABNORMAL HIGH (ref 65–99)
Potassium: 4.2 mmol/L (ref 3.5–5.1)
Sodium: 134 mmol/L — ABNORMAL LOW (ref 135–145)
TOTAL PROTEIN: 6.6 g/dL (ref 6.5–8.1)
Total Bilirubin: 3.3 mg/dL — ABNORMAL HIGH (ref 0.3–1.2)

## 2015-06-11 LAB — URINALYSIS, ROUTINE W REFLEX MICROSCOPIC
BILIRUBIN URINE: NEGATIVE
KETONES UR: NEGATIVE mg/dL
Leukocytes, UA: NEGATIVE
Nitrite: NEGATIVE
PH: 5.5 (ref 5.0–8.0)
Protein, ur: 30 mg/dL — AB
Specific Gravity, Urine: 1.01 (ref 1.005–1.030)
Urobilinogen, UA: 2 mg/dL — ABNORMAL HIGH (ref 0.0–1.0)

## 2015-06-11 LAB — PROTIME-INR
INR: 1.17 (ref 0.00–1.49)
Prothrombin Time: 15.1 seconds (ref 11.6–15.2)

## 2015-06-11 LAB — URINE MICROSCOPIC-ADD ON

## 2015-06-11 LAB — AMMONIA: AMMONIA: 39 umol/L — AB (ref 9–35)

## 2015-06-11 LAB — GLUCOSE, CAPILLARY: Glucose-Capillary: 231 mg/dL — ABNORMAL HIGH (ref 65–99)

## 2015-06-11 LAB — BRAIN NATRIURETIC PEPTIDE: B NATRIURETIC PEPTIDE 5: 89 pg/mL (ref 0.0–100.0)

## 2015-06-11 LAB — TROPONIN I

## 2015-06-11 LAB — LACTIC ACID, PLASMA
Lactic Acid, Venous: 1.8 mmol/L (ref 0.5–2.0)
Lactic Acid, Venous: <0.3 mmol/L — ABNORMAL LOW (ref 0.5–2.0)

## 2015-06-11 LAB — MRSA PCR SCREENING: MRSA by PCR: NEGATIVE

## 2015-06-11 MED ORDER — ONDANSETRON HCL 4 MG/2ML IJ SOLN
4.0000 mg | Freq: Four times a day (QID) | INTRAMUSCULAR | Status: DC | PRN
  Administered 2015-06-16 – 2015-06-24 (×2): 4 mg via INTRAVENOUS
  Filled 2015-06-11 (×2): qty 2

## 2015-06-11 MED ORDER — RIFAXIMIN 550 MG PO TABS
550.0000 mg | ORAL_TABLET | Freq: Two times a day (BID) | ORAL | Status: DC
  Administered 2015-06-11 – 2015-06-26 (×30): 550 mg via ORAL
  Filled 2015-06-11 (×30): qty 1

## 2015-06-11 MED ORDER — SPIRONOLACTONE 25 MG PO TABS
25.0000 mg | ORAL_TABLET | Freq: Every day | ORAL | Status: DC
  Administered 2015-06-11 – 2015-06-16 (×6): 25 mg via ORAL
  Filled 2015-06-11 (×6): qty 1

## 2015-06-11 MED ORDER — INSULIN ASPART 100 UNIT/ML ~~LOC~~ SOLN
0.0000 [IU] | Freq: Three times a day (TID) | SUBCUTANEOUS | Status: DC
  Administered 2015-06-12: 4 [IU] via SUBCUTANEOUS
  Administered 2015-06-12: 7 [IU] via SUBCUTANEOUS
  Administered 2015-06-12: 11 [IU] via SUBCUTANEOUS
  Administered 2015-06-13: 4 [IU] via SUBCUTANEOUS
  Administered 2015-06-13: 11 [IU] via SUBCUTANEOUS
  Administered 2015-06-13 – 2015-06-14 (×2): 7 [IU] via SUBCUTANEOUS
  Administered 2015-06-15: 3 [IU] via SUBCUTANEOUS
  Administered 2015-06-15: 4 [IU] via SUBCUTANEOUS
  Administered 2015-06-16: 3 [IU] via SUBCUTANEOUS
  Administered 2015-06-16 – 2015-06-17 (×4): 4 [IU] via SUBCUTANEOUS
  Administered 2015-06-18: 3 [IU] via SUBCUTANEOUS
  Administered 2015-06-18: 4 [IU] via SUBCUTANEOUS
  Administered 2015-06-18: 3 [IU] via SUBCUTANEOUS
  Administered 2015-06-19: 4 [IU] via SUBCUTANEOUS
  Administered 2015-06-19: 3 [IU] via SUBCUTANEOUS
  Administered 2015-06-19 – 2015-06-20 (×3): 4 [IU] via SUBCUTANEOUS
  Administered 2015-06-20 – 2015-06-21 (×2): 3 [IU] via SUBCUTANEOUS
  Administered 2015-06-21 – 2015-06-22 (×3): 4 [IU] via SUBCUTANEOUS
  Administered 2015-06-22: 3 [IU] via SUBCUTANEOUS
  Administered 2015-06-22 – 2015-06-23 (×3): 4 [IU] via SUBCUTANEOUS
  Administered 2015-06-23: 3 [IU] via SUBCUTANEOUS
  Administered 2015-06-24 (×3): 4 [IU] via SUBCUTANEOUS
  Administered 2015-06-25: 7 [IU] via SUBCUTANEOUS
  Administered 2015-06-25 – 2015-06-26 (×2): 3 [IU] via SUBCUTANEOUS
  Administered 2015-06-26: 4 [IU] via SUBCUTANEOUS

## 2015-06-11 MED ORDER — SODIUM CHLORIDE 0.9 % IJ SOLN
3.0000 mL | Freq: Two times a day (BID) | INTRAMUSCULAR | Status: DC
  Administered 2015-06-11 – 2015-06-24 (×22): 3 mL via INTRAVENOUS

## 2015-06-11 MED ORDER — FENTANYL CITRATE (PF) 100 MCG/2ML IJ SOLN
50.0000 ug | Freq: Once | INTRAMUSCULAR | Status: AC
  Administered 2015-06-11: 50 ug via INTRAVENOUS
  Filled 2015-06-11: qty 2

## 2015-06-11 MED ORDER — OXYCODONE HCL 5 MG PO TABS
10.0000 mg | ORAL_TABLET | ORAL | Status: DC | PRN
  Administered 2015-06-11 – 2015-06-25 (×40): 10 mg via ORAL
  Filled 2015-06-11 (×42): qty 2

## 2015-06-11 MED ORDER — MORPHINE SULFATE (PF) 4 MG/ML IV SOLN
4.0000 mg | Freq: Once | INTRAVENOUS | Status: AC
  Administered 2015-06-11: 4 mg via INTRAVENOUS
  Filled 2015-06-11: qty 1

## 2015-06-11 MED ORDER — DEXTROSE 5 % IV SOLN
2.0000 g | Freq: Once | INTRAVENOUS | Status: AC
  Administered 2015-06-11: 2 g via INTRAVENOUS
  Filled 2015-06-11: qty 2

## 2015-06-11 MED ORDER — METRONIDAZOLE IN NACL 5-0.79 MG/ML-% IV SOLN
500.0000 mg | Freq: Once | INTRAVENOUS | Status: AC
  Administered 2015-06-11: 500 mg via INTRAVENOUS
  Filled 2015-06-11: qty 100

## 2015-06-11 MED ORDER — SODIUM CHLORIDE 0.9 % IV SOLN: INTRAVENOUS | Status: DC

## 2015-06-11 MED ORDER — INSULIN GLARGINE 100 UNIT/ML SOLOSTAR PEN: 10.0000 [IU] | PEN_INJECTOR | Freq: Every day | SUBCUTANEOUS | Status: DC

## 2015-06-11 MED ORDER — PANTOPRAZOLE SODIUM 40 MG PO TBEC
40.0000 mg | DELAYED_RELEASE_TABLET | Freq: Every day | ORAL | Status: DC
  Administered 2015-06-11 – 2015-06-26 (×16): 40 mg via ORAL
  Filled 2015-06-11 (×16): qty 1

## 2015-06-11 MED ORDER — HYDROXYZINE HCL 10 MG PO TABS
10.0000 mg | ORAL_TABLET | Freq: Four times a day (QID) | ORAL | Status: DC | PRN
  Filled 2015-06-11: qty 1

## 2015-06-11 MED ORDER — DEXTROSE 5 % IV SOLN
INTRAVENOUS | Status: AC
  Filled 2015-06-11 (×2): qty 2

## 2015-06-11 MED ORDER — LACTULOSE 10 GM/15ML PO SOLN
10.0000 g | Freq: Three times a day (TID) | ORAL | Status: DC
  Administered 2015-06-11 – 2015-06-26 (×43): 10 g via ORAL
  Filled 2015-06-11 (×43): qty 30

## 2015-06-11 MED ORDER — VANCOMYCIN HCL IN DEXTROSE 1-5 GM/200ML-% IV SOLN: 1000.0000 mg | Freq: Once | INTRAVENOUS | Status: DC

## 2015-06-11 MED ORDER — INSULIN ASPART 100 UNIT/ML ~~LOC~~ SOLN
0.0000 [IU] | Freq: Every day | SUBCUTANEOUS | Status: DC
  Administered 2015-06-11: 2 [IU] via SUBCUTANEOUS

## 2015-06-11 MED ORDER — ALBUTEROL SULFATE (2.5 MG/3ML) 0.083% IN NEBU
3.0000 mL | INHALATION_SOLUTION | Freq: Four times a day (QID) | RESPIRATORY_TRACT | Status: DC | PRN
  Administered 2015-06-13 – 2015-06-18 (×2): 3 mL via RESPIRATORY_TRACT
  Filled 2015-06-11 (×2): qty 3

## 2015-06-11 MED ORDER — METRONIDAZOLE IN NACL 5-0.79 MG/ML-% IV SOLN
500.0000 mg | Freq: Three times a day (TID) | INTRAVENOUS | Status: DC
  Administered 2015-06-11 – 2015-06-12 (×2): 500 mg via INTRAVENOUS
  Filled 2015-06-11 (×2): qty 100

## 2015-06-11 MED ORDER — SODIUM CHLORIDE 0.9 % IV SOLN
2000.0000 mg | Freq: Once | INTRAVENOUS | Status: AC
  Administered 2015-06-11: 2000 mg via INTRAVENOUS
  Filled 2015-06-11: qty 2000

## 2015-06-11 MED ORDER — ONDANSETRON HCL 4 MG PO TABS
4.0000 mg | ORAL_TABLET | Freq: Four times a day (QID) | ORAL | Status: DC | PRN
  Administered 2015-06-22: 4 mg via ORAL
  Filled 2015-06-11: qty 1

## 2015-06-11 MED ORDER — VANCOMYCIN HCL 10 G IV SOLR
1500.0000 mg | Freq: Two times a day (BID) | INTRAVENOUS | Status: DC
  Administered 2015-06-12 – 2015-06-14 (×6): 1500 mg via INTRAVENOUS
  Filled 2015-06-11 (×13): qty 1500

## 2015-06-11 MED ORDER — ONDANSETRON HCL 4 MG/2ML IJ SOLN
4.0000 mg | Freq: Once | INTRAMUSCULAR | Status: AC
  Administered 2015-06-11: 4 mg via INTRAVENOUS
  Filled 2015-06-11: qty 2

## 2015-06-11 MED ORDER — MORPHINE SULFATE (PF) 4 MG/ML IV SOLN
4.0000 mg | INTRAVENOUS | Status: DC | PRN
  Administered 2015-06-11 – 2015-06-26 (×64): 4 mg via INTRAVENOUS
  Filled 2015-06-11 (×66): qty 1

## 2015-06-11 MED ORDER — CYCLOBENZAPRINE HCL 10 MG PO TABS
10.0000 mg | ORAL_TABLET | Freq: Three times a day (TID) | ORAL | Status: DC | PRN
Start: 2015-06-11 — End: 2015-06-26
  Administered 2015-06-11 – 2015-06-25 (×13): 10 mg via ORAL
  Filled 2015-06-11 (×13): qty 1

## 2015-06-11 MED ORDER — SODIUM CHLORIDE 0.9 % IV BOLUS (SEPSIS)
500.0000 mL | Freq: Once | INTRAVENOUS | Status: AC
  Administered 2015-06-11: 500 mL via INTRAVENOUS

## 2015-06-11 MED ORDER — GLIPIZIDE 5 MG PO TABS
10.0000 mg | ORAL_TABLET | Freq: Every day | ORAL | Status: DC
  Administered 2015-06-12 – 2015-06-13 (×2): 10 mg via ORAL
  Filled 2015-06-11 (×2): qty 2

## 2015-06-11 MED ORDER — FUROSEMIDE 20 MG PO TABS
20.0000 mg | ORAL_TABLET | Freq: Every day | ORAL | Status: DC
  Administered 2015-06-11 – 2015-06-12 (×2): 20 mg via ORAL
  Filled 2015-06-11 (×2): qty 1

## 2015-06-11 MED ORDER — INSULIN GLARGINE 100 UNIT/ML ~~LOC~~ SOLN
10.0000 [IU] | Freq: Every day | SUBCUTANEOUS | Status: DC
  Administered 2015-06-11 – 2015-06-26 (×16): 10 [IU] via SUBCUTANEOUS
  Filled 2015-06-11 (×17): qty 0.1

## 2015-06-11 MED ORDER — DEXTROSE 5 % IV SOLN
2.0000 g | Freq: Three times a day (TID) | INTRAVENOUS | Status: DC
  Administered 2015-06-11 – 2015-06-12 (×3): 2 g via INTRAVENOUS
  Filled 2015-06-11 (×6): qty 2

## 2015-06-11 NOTE — ED Notes (Signed)
Lab reported blood cultures obtained. Still attempting to obtain second IV access.

## 2015-06-11 NOTE — ED Notes (Addendum)
Pt given a sprite zero per thin liquid diet order from EDP. Pt tolerating well. nad noted.

## 2015-06-11 NOTE — Telephone Encounter (Signed)
REVIEWED. AGREE. NO ADDITIONAL RECOMMENDATIONS. 

## 2015-06-11 NOTE — ED Provider Notes (Signed)
CSN: [id number]     Arrival date & time 06/11/15  1437 History   First MD Initiated Contact with Patient 06/11/15 1456     Chief Complaint  Patient presents with  . Leg Swelling    right     (Consider location/radiation/quality/duration/timing/severity/associated sxs/prior Treatment) HPI Comments: Patient from home with 3 day history of pain and swelling to his right leg and right groin. He denies any falls or trauma. Denies any fever. The patient with history of diabetes, COPD, alcoholic cirrhosis with varices. States he's had cellulitis of his right leg before but never this bad. He complains of redness, pain and swelling to his right anterior lower leg as well as right groin. The pain is sharp and constant and worse with attempted ambulation. He denies fever chills or vomiting. He's had intermittent episodes of chest pain or shortness of breath but none currently. States compliance with his medications. Denies any numbness or tingling. No bowel or bladder incontinence.  The history is provided by the patient. The history is limited by the condition of the patient.    Past Medical History  Diagnosis Date  . Hypertension   . Diabetes mellitus   . GERD (gastroesophageal reflux disease)     DEC 2010 EGD/Bx REACTIVE GASTROPATHY, NO VARICES  . Hemorrhoids, internal   . BMI 40.0-44.9, adult ([place]) OCT 2010 269 LBS    APR 2012 279 LBS AUG 2014 185 LBS  . Cirrhosis ([place]) NOV 2010 CHILD PUGH A    ETOH/HCV/OBESITY  . IV drug abuse REMOTE  . Hepatitis 2010 HEP C    AST 509 ALT 267 ALK PHOS 165 ALB 3.8 NEG IGM HAV/HBSAg  . Gallstone AUG 2012 1 CM  . GERD 10/25/2009  . COPD (chronic obstructive pulmonary disease) ([place])   . Hepatitis C   . Pancytopenia ([place]) 2013  . Splenomegaly 2013  . Other pancytopenia ([place]) 02/18/2013  . Iron (Fe) deficiency anemia 02/18/2013  . Splenomegaly 02/18/2013  . Neuropathy ([place])   . Biliary dyskinesia JUL 2015    HIDA GB EF 5%  . Chronic pain   . Chronic leg  pain     left  . Portal venous hypertension (HCC)   . Asthma   . Medically noncompliant 05/30/2015   Past Surgical History  Procedure Laterality Date  . Sigmoidoscopy      2001 DR. FLEISCHMAN INTERNAL HERMORRHOIDS  . Upper gastrointestinal endoscopy  DEC 2010    BENIGN POLYPS, GASTRITIS, ?phg  . Knee surgery  RIGHT  . Hemorrhoid surgery    . Esophageal biopsy  09/08/2011    [name]:Moderate gastritis/Polyps, multiple in the body of the stomach  . Colonoscopy with propofol N/A 11/15/2013    [name]: rectal varices, small AVMs  . Esophagogastroduodenoscopy (egd) with propofol N/A 11/15/2013    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 Next EGD in April 2016.     Family History  Problem Relation Age of Onset  . Colon cancer Neg Hx   . Anesthesia problems Neg Hx   . Hypotension Neg Hx   . Malignant hyperthermia Neg Hx   . Pseudochol deficiency Neg Hx   . Cancer Father    Social History  Substance Use Topics  . Smoking status: Current Some Day Smoker -- 0.50 packs/day    Types: Cigarettes  . Smokeless tobacco: Never Used  . Alcohol Use: No     Comment: 30 years ago  Review of Systems  Constitutional: Positive for activity change and appetite change. Negative for fever.  Eyes: Negative for visual disturbance.  Respiratory: Positive for chest tightness and shortness of breath. Negative for cough.   Gastrointestinal: Negative for nausea, vomiting and abdominal pain.  Genitourinary: Negative for dysuria and hematuria.  Musculoskeletal: Positive for myalgias, joint swelling and arthralgias.  Skin: Negative for rash.  Neurological: Negative for dizziness, weakness and headaches.   A complete 10 system review of systems was obtained and all systems are negative except as noted in the HPI and PMH.     Allergies  Penicillins  Home Medications   Prior to Admission medications   Medication Sig Start Date End  Date Taking? Authorizing Provider  albuterol (PROVENTIL HFA;VENTOLIN HFA) 108 (90 BASE) MCG/ACT inhaler Inhale 2 puffs into the lungs every 6 (six) hours as needed. Shortness of breath 08/14/14  Yes [name], MD  cyclobenzaprine (FLEXERIL) 5 MG tablet Take 25 mg by mouth daily as needed for muscle spasms.  01/25/15  Yes Historical Provider, MD  furosemide (LASIX) 20 MG tablet Take 1 tablet (20 mg total) by mouth at bedtime. 05/15/15  Yes [name], MD  glipiZIDE (GLUCOTROL) 10 MG tablet TAKE 1 TABLET (10 MG TOTAL) BY MOUTH DAILY. 06/04/15  Yes [name], MD  hydrOXYzine (ATARAX/VISTARIL) 10 MG tablet TAKE 1 TABLET (10 MG TOTAL) BY MOUTH EVERY 4 (FOUR) HOURS AS NEEDED FOR ITCHING. 05/03/15  Yes [name], MD  Insulin Glargine (LANTUS SOLOSTAR) 100 UNIT/ML Solostar Pen Inject 10 Units into the skin at bedtime.   Yes Historical Provider, MD  lactulose (CHRONULAC) 10 GM/15ML solution TAKE 15 MLS (10 G TOTAL) BY MOUTH 3 (THREE) TIMES DAILY. 06/08/15  Yes [name], MD  omeprazole (PRILOSEC) 20 MG capsule Take 2 capsules (40 mg total) by mouth daily. 10/16/14  Yes [name], MD  Oxycodone HCl 10 MG TABS Take 1 tab every 4 hours for pain Patient taking differently: Take 10 mg by mouth every 4 (four) hours as needed (for pain). Take 1 tab every 4 hours for pain 06/07/15  Yes [name], MD  rifaximin (XIFAXAN) 550 MG TABS tablet Take 1 tablet (550 mg total) by mouth 2 (two) times daily. 12/19/14  Yes [name], MD  spironolactone (ALDACTONE) 25 MG tablet TAKE ON MONDAY-WEDNESDAY-FRIDAY Patient taking differently: TAKE ONE TABLET BY MOUTH ONCE DAILY 01/22/15  Yes [name], MD  clotrimazole (LOTRIMIN) 1 % cream Apply 1 application topically 2 (two) times daily. Patient not taking: Reported on 06/11/2015 01/18/15   Orvan Falconer, MD   BP 145/67 mmHg  Pulse 96  Temp(Src) 99.2 F (37.3 C) (Oral)  Resp 14  Ht _0  (1.676 m)  Wt 319 lb (144.697 kg)  BMI 51.51  kg/m2  SpO2 96% Physical Exam  Constitutional: He is oriented to person, place, and time. He appears well-developed and well-nourished. No distress.  HENT:  Head: Normocephalic and atraumatic.  Mouth/Throat: Oropharynx is clear and moist. No oropharyngeal exudate.  Eyes: Conjunctivae and EOM are normal. Pupils are equal, round, and reactive to light.  Neck: Normal range of motion. Neck supple.  No meningismus.  Cardiovascular: Normal rate, regular rhythm, normal heart sounds and intact distal pulses.   No murmur heard. Pulmonary/Chest: Effort normal and breath sounds normal. No respiratory distress.  Abdominal: Soft. He exhibits distension. There is tenderness. There is no rebound and no guarding.  Distended and diffusely tender abdomen, no guarding  or rebound.  Musculoskeletal: Normal range of motion. He exhibits edema. He exhibits no tenderness.  Erythema of about 8 cm to right groin with induration without fluctuance. Very tender to palpation. Left testicle is larger than right and more tender. There is a scaling erythematous rash overlying the left hemiscrotum. This does not extend to the perineum or buttocks.  Second rash to right lower extremity on the anterior surface is purpuric and not blanchable. It is extremely tender to palpation. Edema of right lower extremity with difficult to palpate pulses.  Neurological: He is alert and oriented to person, place, and time. No cranial nerve deficit. He exhibits normal muscle tone. Coordination normal.  5/5 strength throughout, moving all extremities  Skin: Skin is warm.  Psychiatric: He has a normal mood and affect. His behavior is normal.  Nursing note and vitals reviewed.       ED Course  Procedures (including critical care time) Labs Review Labs Reviewed  CBC WITH DIFFERENTIAL/PLATELET - Abnormal; Notable for the following:    RBC 3.79 (*)    Hemoglobin 12.2 (*)    HCT 36.1 (*)    Platelets 78 (*)    All other components  within normal limits  COMPREHENSIVE METABOLIC PANEL - Abnormal; Notable for the following:    Sodium 134 (*)    Glucose, Bld 387 (*)    Calcium 8.1 (*)    Albumin 2.5 (*)    AST 218 (*)    ALT 106 (*)    Alkaline Phosphatase 154 (*)    Total Bilirubin 3.3 (*)    All other components within normal limits  AMMONIA - Abnormal; Notable for the following:    Ammonia 39 (*)    All other components within normal limits  URINALYSIS, ROUTINE W REFLEX MICROSCOPIC (NOT AT [facility]) - Abnormal; Notable for the following:    Glucose, UA >1000 (*)    Hgb urine dipstick MODERATE (*)    Protein, ur 30 (*)    Urobilinogen, UA 2.0 (*)    All other components within normal limits  LACTIC ACID, PLASMA - Abnormal; Notable for the following:    Lactic Acid, Venous <0.3 (*)    All other components within normal limits  URINE MICROSCOPIC-ADD ON - Abnormal; Notable for the following:    Squamous Epithelial / LPF FEW (*)    All other components within normal limits  GLUCOSE, CAPILLARY - Abnormal; Notable for the following:    Glucose-Capillary 231 (*)    All other components within normal limits  MRSA PCR SCREENING  CULTURE, BLOOD (ROUTINE X 2)  CULTURE, BLOOD (ROUTINE X 2)  URINE CULTURE  BRAIN NATRIURETIC PEPTIDE  PROTIME-INR  TROPONIN I  LACTIC ACID, PLASMA  COMPREHENSIVE METABOLIC PANEL  CBC  PROTIME-INR    Imaging Review Dg Tibia/fibula Right  06/11/2015  CLINICAL DATA:  Right leg cellulitis, pain and redness EXAM: RIGHT TIBIA AND FIBULA - 2 VIEW COMPARISON:  None. FINDINGS: Two views of the right tibia-fibula submitted. No acute fracture or subluxation. No evidence of osteomyelitis. Diffuse soft tissue swelling. No radiopaque foreign body. IMPRESSION: No acute fracture or subluxation.  Diffuse soft tissue swelling. Electronically Signed   By: Lahoma Crocker M.D.   On: 06/11/2015 15:47   US Scrotum  06/11/2015  CLINICAL DATA:  Two day history of right lower extremity pain and erythema. Blistering  involving the right groin. Current history of hypertension and diabetes. Current history of hepatic cirrhosis. EXAM: SCROTAL ULTRASOUND DOPPLER ULTRASOUND OF THE TESTICLES TECHNIQUE:  Complete ultrasound examination of the testicles, epididymis, and other scrotal structures was performed. Color and spectral Doppler ultrasound were also utilized to evaluate blood flow to the testicles. COMPARISON:  CT abdomen pelvis 01/15/2015 which included the scrotum. FINDINGS: Right testicle Measurements: Approximately 4.8 x 1.7 x 2.6 cm. Normal parenchymal echotexture without mass or microlithiasis. Normal color Doppler flow without evidence of hyperemia. Left testicle Measurements: Approximately 4.4 x 2.3 x 2.5 cm. Normal parenchymal echotexture without mass or microlithiasis. Intratesticular varix. Normal color Doppler flow otherwise without evidence of hyperemia. Right epididymis: Normal in size and appearance without evidence of hyperemia. Left epididymis: Normal in size and appearance without evidence of hyperemia. Hydrocele: Small right hydrocele. Moderately large left hydrocele. Present on the prior CT and likely unchanged. Varicocele:  Large left varicocele as noted on the prior CT. Pulsed Doppler interrogation of both testes demonstrates normal low resistance arterial and venous waveforms bilaterally. IMPRESSION: 1. No evidence of testicular torsion or epididymo-orchitis. 2. Large left varicocele, including an intratesticular varix. 3. Moderately large left hydrocele and small right hydrocele. 4. Above findings were present on the prior CT from June, 2016, and are not significantly changed Electronically Signed   By: Evangeline Dakin M.D.   On: 06/11/2015 17:24   US Venous Img Lower Unilateral Right  06/11/2015  CLINICAL DATA:  Chronic right leg pain and swelling.  Cellulitis. EXAM: RIGHT LOWER EXTREMITY VENOUS DOPPLER ULTRASOUND TECHNIQUE: Gray-scale sonography with graded compression, as well as color Doppler and  duplex ultrasound were performed to evaluate the lower extremity deep venous systems from the level of the common femoral vein and including the common femoral, femoral, profunda femoral, popliteal and calf veins including the posterior tibial, peroneal and gastrocnemius veins when visible. The superficial great saphenous vein was also interrogated. Spectral Doppler was utilized to evaluate flow at rest and with distal augmentation maneuvers in the common femoral, femoral and popliteal veins. The patient was unable to tolerate probe pressure in the groin and proximal thigh due to prominent soft tissue swelling and redness and weeping blisters in that area. COMPARISON:  Venous duplex ultrasound dated 11/15/2014 FINDINGS: Contralateral Common Femoral Vein: Not visualized. Common Femoral Vein: Not visualized. Saphenofemoral Junction: Not visualized. Profunda Femoral Vein: Not visualized. Femoral Vein: Proximal femoral vein was not visualized. No evidence of thrombus. Normal compressibility, respiratory phasicity and response to augmentation. Popliteal Vein: No evidence of thrombus. Normal compressibility, respiratory phasicity and response to augmentation. Calf Veins: No evidence of thrombus. Normal compressibility and flow on color Doppler imaging. The peroneal vein was not seen. Venous Reflux:  None. Other Findings:  None. IMPRESSION: No evidence of deep venous thrombosis. However, the study is limited due to the cellulitis and weeping wounds as described above. Electronically Signed   By: Lorriane Shire M.D.   On: 06/11/2015 17:28   [place] Art/ven Flow Abd Pelv Doppler  06/11/2015  CLINICAL DATA:  Two day history of right lower extremity pain and erythema. Blistering involving the right groin. Current history of hypertension and diabetes. Current history of hepatic cirrhosis. EXAM: SCROTAL ULTRASOUND DOPPLER ULTRASOUND OF THE TESTICLES TECHNIQUE: Complete ultrasound examination of the testicles, epididymis, and  other scrotal structures was performed. Color and spectral Doppler ultrasound were also utilized to evaluate blood flow to the testicles. COMPARISON:  CT abdomen pelvis 01/15/2015 which included the scrotum. FINDINGS: Right testicle Measurements: Approximately 4.8 x 1.7 x 2.6 cm. Normal parenchymal echotexture without mass or microlithiasis. Normal color Doppler flow without evidence of hyperemia. Left testicle Measurements: Approximately 4.4  x 2.3 x 2.5 cm. Normal parenchymal echotexture without mass or microlithiasis. Intratesticular varix. Normal color Doppler flow otherwise without evidence of hyperemia. Right epididymis: Normal in size and appearance without evidence of hyperemia. Left epididymis: Normal in size and appearance without evidence of hyperemia. Hydrocele: Small right hydrocele. Moderately large left hydrocele. Present on the prior CT and likely unchanged. Varicocele:  Large left varicocele as noted on the prior CT. Pulsed Doppler interrogation of both testes demonstrates normal low resistance arterial and venous waveforms bilaterally. IMPRESSION: 1. No evidence of testicular torsion or epididymo-orchitis. 2. Large left varicocele, including an intratesticular varix. 3. Moderately large left hydrocele and small right hydrocele. 4. Above findings were present on the prior CT from June, 2016, and are not significantly changed Electronically Signed   By: Evangeline Dakin M.D.   On: 06/11/2015 17:24   Dg Chest Portable 1 View  06/11/2015  CLINICAL DATA:  Initial encounter for shortness of breath EXAM: PORTABLE CHEST 1 VIEW COMPARISON:  02/01/2015. FINDINGS: 1532 hours. Low volume film with lordotic patient positioning. Haziness over the lower lungs compatible with superimposition of soft tissues. The lungs are clear without focal pneumonia, edema, pneumothorax or pleural effusion. Cardiopericardial silhouette is at upper limits of normal for size. Telemetry leads overlie the chest. IMPRESSION: Low  volume film without acute cardiopulmonary findings. Electronically Signed   By: Misty Stanley M.D.   On: 06/11/2015 15:46   I have personally reviewed and evaluated these images and lab results as part of my medical decision-making.   EKG Interpretation   Date/Time:  Monday June 11 2015 14:56:37 EDT Ventricular Rate:  91 PR Interval:  146 QRS Duration: 92 QT Interval:  392 QTC Calculation: 482 R Axis:   70 Text Interpretation:  Sinus rhythm Anterior infarct, old Baseline wander  in lead(s) III aVF V1 V2 V3 V4 Artifact Confirmed by Wyvonnia Dusky  MD, [place]  ([id number]) on 06/11/2015 3:23:55 PM      MDM   Final diagnoses:  Testicle pain  Cellulitis of right leg   Patient with cellulitis to right leg with extreme tenderness to palpation. There is no crepitance. There is no evidence of Forneir's gangrene. He is afebrile. Labs will be obtained with cultures and lactic acid level.  Patient will be treated for cellulitis of his right leg. Distal pulses are intact. He is afebrile. There is no leukocytosis. X-ray does not show any subcutaneous gas.  Pulses intact by Doppler. Labs show no leukocytosis, normal lactic acid. Patient is afebrile. Patient started on antibiotics for cellulitis.   Ultrasound does not show any clot. Testicles have no torsion. There is no evidence of necrotizing fasciitis at this time. Patient given broad-spectrum antibiotics for extensive cellulitis after cultures obtained. There is no drainable fluid collection on ultrasound.  Vital signs remained stable in the ED. Admission d/w Dr. Anastasio Champion.  CRITICAL CARE Performed by: Ezequiel Essex Total critical care time: 30 minutes Critical care time was exclusive of separately billable procedures and treating other patients. Critical care was necessary to treat or prevent imminent or life-threatening deterioration. Critical care was time spent personally by me on the following activities: development of treatment plan  with patient and/or surrogate as well as nursing, discussions with consultants, evaluation of patient's response to treatment, examination of patient, obtaining history from patient or surrogate, ordering and performing treatments and interventions, ordering and review of laboratory studies, ordering and review of radiographic studies, pulse oximetry and re-evaluation of patient's condition.     Ezequiel Essex, MD  06/12/15 0051

## 2015-06-11 NOTE — Telephone Encounter (Signed)
New pt assistance forms are at the front desk for the pt to pick up

## 2015-06-11 NOTE — Telephone Encounter (Signed)
FYI to [name] that pt is going to ED.

## 2015-06-11 NOTE — Progress Notes (Signed)
ANTIBIOTIC CONSULT NOTE - INITIAL  Pharmacy Consult for Vancomycin, Aztreonam, and Flagyl Indication: cellulitis  Allergies  Allergen Reactions  . Penicillins Hives    Patient Measurements: Height: 5\' 6"  (167.6 cm) Weight: (!) 319 lb (144.697 kg) IBW/kg (Calculated) : 63.8 Adjusted Body Weight: 96.2kg  Vital Signs: Temp: 98.2 F (36.8 C) (10/31 1439) Temp Source: Oral (10/31 1439) BP: 180/57 mmHg (10/31 1439) Pulse Rate: 97 (10/31 1439) Intake/Output from previous day:   Intake/Output from this shift:    Labs: No results for input(s): WBC, HGB, PLT, LABCREA, CREATININE in the last 72 hours. Estimated Creatinine Clearance: 145.3 mL/min (by C-G formula based on Cr of 0.76). No results for input(s): VANCOTROUGH, VANCOPEAK, VANCORANDOM, GENTTROUGH, GENTPEAK, GENTRANDOM, TOBRATROUGH, TOBRAPEAK, TOBRARND, AMIKACINPEAK, AMIKACINTROU, AMIKACIN in the last 72 hours.   Microbiology: No results found for this or any previous visit (from the past 720 hour(s)).  Medical History: Past Medical History  Diagnosis Date  . Hypertension   . Diabetes mellitus   . GERD (gastroesophageal reflux disease)     DEC 2010 EGD/Bx REACTIVE GASTROPATHY, NO VARICES  . Hemorrhoids, internal   . BMI 40.0-44.9, adult ([place]) OCT 2010 269 LBS    APR 2012 279 LBS AUG 2014 185 LBS  . Cirrhosis ([place]) NOV 2010 CHILD PUGH A    ETOH/HCV/OBESITY  . IV drug abuse REMOTE  . Hepatitis 2010 HEP C    AST 509 ALT 267 ALK PHOS 165 ALB 3.8 NEG IGM HAV/HBSAg  . Gallstone AUG 2012 1 CM  . GERD 10/25/2009  . COPD (chronic obstructive pulmonary disease) ([place])   . Hepatitis C   . Pancytopenia ([place]) 2013  . Splenomegaly 2013  . Other pancytopenia ([place]) 02/18/2013  . Iron (Fe) deficiency anemia 02/18/2013  . Splenomegaly 02/18/2013  . Neuropathy ([place])   . Biliary dyskinesia JUL 2015    HIDA GB EF 5%  . Chronic pain   . Chronic leg pain     left  . Portal venous hypertension ([facility])   . Asthma   . Medically  noncompliant 05/30/2015    Medications:  See med hx Assessment:  54 yo morbidly obese male with 3 day history of pain and swelling to his right leg and right groin. He denies any falls or trauma. Denies any fever. History of diabetes, COPD, alcoholic cirrhosis with varices. States he's had cellulitis of his right leg before but never this bad. He complains of redness, pain and swelling to his right anterior lower leg as well as right groin. The pain is sharp and constant and worse with attempted ambulation. No fever chills or vomiting  Goal of Therapy:  Vancomycin trough level 10-15 mcg/ml  Plan:  Aztreonam 2gm iv Q8h iv Flagyl 500mg  vi Q8H Vancomycin 2gm loading dose, then 1500mg  iv q12h Measure antibiotic drug levels at steady state Follow up culture results Monitor labs and vital signs  Isac Sarna, BS [place], BCPS Clinical Pharmacist Pager [phone] 06/11/2015,3:35 PM

## 2015-06-11 NOTE — H&P (Signed)
[facility] History and Physical  STEPHAN NELIS UVO:[id number] DOB: January 31, 1961 DOA: 06/11/2015  Referring physician: ER PCP: [name], MD   Chief Complaint: Right leg swelling/pain/redness  HPI: [name] is a 54 y.o. male  This is a 54 year old man, diabetic, history of alcoholic cirrhosis who now presents with right leg and groin pain and swelling with redness. He is have the symptoms the last 3 days and they seem to be getting worse. The pain in the leg is sharp and constant and seems to be getting worse, especially with ambulation. He has been feeling cold as if he might be having chills. There's been no documented fever. There is no cough or dyspnea or chest pain. There is no abdominal pain. There is no vomiting. He is now being admitted for management of his severe cellulitis of his right leg.   Review of Systems:  Apart from symptoms above, all systems are negative.  Past Medical History  Diagnosis Date  . Hypertension   . Diabetes mellitus   . GERD (gastroesophageal reflux disease)     DEC 2010 EGD/Bx REACTIVE GASTROPATHY, NO VARICES  . Hemorrhoids, internal   . BMI 40.0-44.9, adult ([place]) OCT 2010 269 LBS    APR 2012 279 LBS AUG 2014 185 LBS  . Cirrhosis ([place]) NOV 2010 CHILD PUGH A    ETOH/HCV/OBESITY  . IV drug abuse REMOTE  . Hepatitis 2010 HEP C    AST 509 ALT 267 ALK PHOS 165 ALB 3.8 NEG IGM HAV/HBSAg  . Gallstone AUG 2012 1 CM  . GERD 10/25/2009  . COPD (chronic obstructive pulmonary disease) ([place])   . Hepatitis C   . Pancytopenia ([place]) 2013  . Splenomegaly 2013  . Other pancytopenia ([place]) 02/18/2013  . Iron (Fe) deficiency anemia 02/18/2013  . Splenomegaly 02/18/2013  . Neuropathy ([place])   . Biliary dyskinesia JUL 2015    HIDA GB EF 5%  . Chronic pain   . Chronic leg pain     left  . Portal venous hypertension ([facility])   . Asthma   . Medically noncompliant 05/30/2015   Past Surgical History  Procedure Laterality Date  . Sigmoidoscopy        2001 DR. FLEISCHMAN INTERNAL HERMORRHOIDS  . Upper gastrointestinal endoscopy  DEC 2010    BENIGN POLYPS, GASTRITIS, ?phg  . Knee surgery  RIGHT  . Hemorrhoid surgery    . Esophageal biopsy  09/08/2011    [name]:Moderate gastritis/Polyps, multiple in the body of the stomach  . Colonoscopy with propofol N/A 11/15/2013    [name]: rectal varices, small AVMs  . Esophagogastroduodenoscopy (egd) with propofol N/A 11/15/2013    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 Next EGD in April 2016.     Social History:  reports that he has been smoking Cigarettes.  He has been smoking about 0.50 packs per day. He has never used smokeless tobacco. He reports that he does not drink alcohol or use illicit drugs.  Allergies  Allergen Reactions  . Penicillins Hives    Has patient had a PCN reaction causing immediate rash, facial/tongue/throat swelling, SOB or lightheadedness with hypotension: Yes Has patient had a PCN reaction causing severe rash involving mucus membranes or skin necrosis: Yes Has patient had a PCN reaction that required hospitalization No Has patient had a PCN reaction occurring within the last 10 years: No If all of the above answers are "NO", then may proceed  with Cephalosporin use.     Family History  Problem Relation Age of Onset  . Colon cancer Neg Hx   . Anesthesia problems Neg Hx   . Hypotension Neg Hx   . Malignant hyperthermia Neg Hx   . Pseudochol deficiency Neg Hx   . Cancer Father     Prior to Admission medications   Medication Sig Start Date End Date Taking? Authorizing Provider  albuterol (PROVENTIL HFA;VENTOLIN HFA) 108 (90 BASE) MCG/ACT inhaler Inhale 2 puffs into the lungs every 6 (six) hours as needed. Shortness of breath 08/14/14  Yes [name], MD  cyclobenzaprine (FLEXERIL) 5 MG tablet Take 25 mg by mouth daily as needed for muscle spasms.  01/25/15  Yes Historical Provider, MD   furosemide (LASIX) 20 MG tablet Take 1 tablet (20 mg total) by mouth at bedtime. 05/15/15  Yes [name], MD  glipiZIDE (GLUCOTROL) 10 MG tablet TAKE 1 TABLET (10 MG TOTAL) BY MOUTH DAILY. 06/04/15  Yes [name], MD  hydrOXYzine (ATARAX/VISTARIL) 10 MG tablet TAKE 1 TABLET (10 MG TOTAL) BY MOUTH EVERY 4 (FOUR) HOURS AS NEEDED FOR ITCHING. 05/03/15  Yes [name], MD  Insulin Glargine (LANTUS SOLOSTAR) 100 UNIT/ML Solostar Pen Inject 10 Units into the skin at bedtime.   Yes Historical Provider, MD  lactulose (CHRONULAC) 10 GM/15ML solution TAKE 15 MLS (10 G TOTAL) BY MOUTH 3 (THREE) TIMES DAILY. 06/08/15  Yes [name], MD  omeprazole (PRILOSEC) 20 MG capsule Take 2 capsules (40 mg total) by mouth daily. 10/16/14  Yes [name], MD  Oxycodone HCl 10 MG TABS Take 1 tab every 4 hours for pain Patient taking differently: Take 10 mg by mouth every 4 (four) hours as needed (for pain). Take 1 tab every 4 hours for pain 06/07/15  Yes [name], MD  rifaximin (XIFAXAN) 550 MG TABS tablet Take 1 tablet (550 mg total) by mouth 2 (two) times daily. 12/19/14  Yes [name], MD  spironolactone (ALDACTONE) 25 MG tablet TAKE ON MONDAY-WEDNESDAY-FRIDAY Patient taking differently: TAKE ONE TABLET BY MOUTH ONCE DAILY 01/22/15  Yes [name], MD  clotrimazole (LOTRIMIN) 1 % cream Apply 1 application topically 2 (two) times daily. Patient not taking: Reported on 06/11/2015 01/18/15   Orvan Falconer, MD   Physical Exam: Filed Vitals:   06/11/15 1714 06/11/15 1730 06/11/15 1800 06/11/15 1830  BP:  157/69 155/69 175/75  Pulse:  81 84 85  Temp: 98.1 F (36.7 C)     TempSrc: Oral     Resp:  $Remo'21 17 16  'Pgmgb$ Height:      Weight:      SpO2:  99% 100% 99%    Wt Readings from Last 3 Encounters:  06/11/15 144.697 kg (319 lb)  05/31/15 145.151 kg (320 lb)  05/31/15 144.697 kg (319 lb)    General:  Appears uncomfortable/and pain with his right leg. He does not look toxic or  septic. Eyes: PERRL, normal lids, irises & conjunctiva ENT: grossly normal hearing, lips & tongue Neck: no LAD, masses or thyromegaly Cardiovascular: RRR, no m/r/g. No LE edema. Telemetry: SR, no arrhythmias  Respiratory: CTA bilaterally, no w/r/r. Normal respiratory effort. Abdomen: soft, ntnd Skin: Cellulitis extending from the right foot/ankle area up to the right thigh/groin area. There are areas in the right leg where there does not appear to be cellulitis. Musculoskeletal: grossly normal tone BUE/BLE Psychiatric: grossly normal mood and affect, speech fluent and appropriate Neurologic: grossly non-focal.  Labs on Admission:  Basic Metabolic Panel:  Recent Labs Lab 06/11/15 1546  NA 134*  K 4.2  CL 103  CO2 25  GLUCOSE 387*  BUN 12  CREATININE 0.94  CALCIUM 8.1*   Liver Function Tests:  Recent Labs Lab 06/11/15 1546  AST 218*  ALT 106*  ALKPHOS 154*  BILITOT 3.3*  PROT 6.6  ALBUMIN 2.5*   No results for input(s): LIPASE, AMYLASE in the last 168 hours.  Recent Labs Lab 06/11/15 1546  AMMONIA 39*   CBC:  Recent Labs Lab 06/11/15 1546  WBC 6.2  NEUTROABS 4.4  HGB 12.2*  HCT 36.1*  MCV 95.3  PLT 78*   Cardiac Enzymes:  Recent Labs Lab 06/11/15 1546  TROPONINI <0.03    BNP (last 3 results)  Recent Labs  06/11/15 1546  BNP 89.0    ProBNP (last 3 results)  Recent Labs  07/31/14 1220  PROBNP 582.6*    CBG: No results for input(s): GLUCAP in the last 168 hours.  Radiological Exams on Admission: Dg Tibia/fibula Right  06/11/2015  CLINICAL DATA:  Right leg cellulitis, pain and redness EXAM: RIGHT TIBIA AND FIBULA - 2 VIEW COMPARISON:  None. FINDINGS: Two views of the right tibia-fibula submitted. No acute fracture or subluxation. No evidence of osteomyelitis. Diffuse soft tissue swelling. No radiopaque foreign body. IMPRESSION: No acute fracture or subluxation.  Diffuse soft tissue swelling. Electronically Signed   By: Lahoma Crocker M.D.   On: 06/11/2015 15:47   US Scrotum  06/11/2015  CLINICAL DATA:  Two day history of right lower extremity pain and erythema. Blistering involving the right groin. Current history of hypertension and diabetes. Current history of hepatic cirrhosis. EXAM: SCROTAL ULTRASOUND DOPPLER ULTRASOUND OF THE TESTICLES TECHNIQUE: Complete ultrasound examination of the testicles, epididymis, and other scrotal structures was performed. Color and spectral Doppler ultrasound were also utilized to evaluate blood flow to the testicles. COMPARISON:  CT abdomen pelvis 01/15/2015 which included the scrotum. FINDINGS: Right testicle Measurements: Approximately 4.8 x 1.7 x 2.6 cm. Normal parenchymal echotexture without mass or microlithiasis. Normal color Doppler flow without evidence of hyperemia. Left testicle Measurements: Approximately 4.4 x 2.3 x 2.5 cm. Normal parenchymal echotexture without mass or microlithiasis. Intratesticular varix. Normal color Doppler flow otherwise without evidence of hyperemia. Right epididymis: Normal in size and appearance without evidence of hyperemia. Left epididymis: Normal in size and appearance without evidence of hyperemia. Hydrocele: Small right hydrocele. Moderately large left hydrocele. Present on the prior CT and likely unchanged. Varicocele:  Large left varicocele as noted on the prior CT. Pulsed Doppler interrogation of both testes demonstrates normal low resistance arterial and venous waveforms bilaterally. IMPRESSION: 1. No evidence of testicular torsion or epididymo-orchitis. 2. Large left varicocele, including an intratesticular varix. 3. Moderately large left hydrocele and small right hydrocele. 4. Above findings were present on the prior CT from June, 2016, and are not significantly changed Electronically Signed   By: Evangeline Dakin M.D.   On: 06/11/2015 17:24   US Venous Img Lower Unilateral Right  06/11/2015  CLINICAL DATA:  Chronic right leg pain and swelling.   Cellulitis. EXAM: RIGHT LOWER EXTREMITY VENOUS DOPPLER ULTRASOUND TECHNIQUE: Gray-scale sonography with graded compression, as well as color Doppler and duplex ultrasound were performed to evaluate the lower extremity deep venous systems from the level of the common femoral vein and including the common femoral, femoral, profunda femoral, popliteal and calf veins including the posterior tibial, peroneal and gastrocnemius veins when visible. The superficial  great saphenous vein was also interrogated. Spectral Doppler was utilized to evaluate flow at rest and with distal augmentation maneuvers in the common femoral, femoral and popliteal veins. The patient was unable to tolerate probe pressure in the groin and proximal thigh due to prominent soft tissue swelling and redness and weeping blisters in that area. COMPARISON:  Venous duplex ultrasound dated 11/15/2014 FINDINGS: Contralateral Common Femoral Vein: Not visualized. Common Femoral Vein: Not visualized. Saphenofemoral Junction: Not visualized. Profunda Femoral Vein: Not visualized. Femoral Vein: Proximal femoral vein was not visualized. No evidence of thrombus. Normal compressibility, respiratory phasicity and response to augmentation. Popliteal Vein: No evidence of thrombus. Normal compressibility, respiratory phasicity and response to augmentation. Calf Veins: No evidence of thrombus. Normal compressibility and flow on color Doppler imaging. The peroneal vein was not seen. Venous Reflux:  None. Other Findings:  None. IMPRESSION: No evidence of deep venous thrombosis. However, the study is limited due to the cellulitis and weeping wounds as described above. Electronically Signed   By: Lorriane Shire M.D.   On: 06/11/2015 17:28   [place] Art/ven Flow Abd Pelv Doppler  06/11/2015  CLINICAL DATA:  Two day history of right lower extremity pain and erythema. Blistering involving the right groin. Current history of hypertension and diabetes. Current history of hepatic  cirrhosis. EXAM: SCROTAL ULTRASOUND DOPPLER ULTRASOUND OF THE TESTICLES TECHNIQUE: Complete ultrasound examination of the testicles, epididymis, and other scrotal structures was performed. Color and spectral Doppler ultrasound were also utilized to evaluate blood flow to the testicles. COMPARISON:  CT abdomen pelvis 01/15/2015 which included the scrotum. FINDINGS: Right testicle Measurements: Approximately 4.8 x 1.7 x 2.6 cm. Normal parenchymal echotexture without mass or microlithiasis. Normal color Doppler flow without evidence of hyperemia. Left testicle Measurements: Approximately 4.4 x 2.3 x 2.5 cm. Normal parenchymal echotexture without mass or microlithiasis. Intratesticular varix. Normal color Doppler flow otherwise without evidence of hyperemia. Right epididymis: Normal in size and appearance without evidence of hyperemia. Left epididymis: Normal in size and appearance without evidence of hyperemia. Hydrocele: Small right hydrocele. Moderately large left hydrocele. Present on the prior CT and likely unchanged. Varicocele:  Large left varicocele as noted on the prior CT. Pulsed Doppler interrogation of both testes demonstrates normal low resistance arterial and venous waveforms bilaterally. IMPRESSION: 1. No evidence of testicular torsion or epididymo-orchitis. 2. Large left varicocele, including an intratesticular varix. 3. Moderately large left hydrocele and small right hydrocele. 4. Above findings were present on the prior CT from June, 2016, and are not significantly changed Electronically Signed   By: Evangeline Dakin M.D.   On: 06/11/2015 17:24   Dg Chest Portable 1 View  06/11/2015  CLINICAL DATA:  Initial encounter for shortness of breath EXAM: PORTABLE CHEST 1 VIEW COMPARISON:  02/01/2015. FINDINGS: 1532 hours. Low volume film with lordotic patient positioning. Haziness over the lower lungs compatible with superimposition of soft tissues. The lungs are clear without focal pneumonia, edema,  pneumothorax or pleural effusion. Cardiopericardial silhouette is at upper limits of normal for size. Telemetry leads overlie the chest. IMPRESSION: Low volume film without acute cardiopulmonary findings. Electronically Signed   By: Misty Stanley M.D.   On: 06/11/2015 15:46      Assessment/Plan   1. Extensive right leg cellulitis. Broad-spectrum multiple antibiotics in view of the fact that he is diabetic and has immunocompromised system. 2. Diabetes. Continue with home medications and sliding scale of insulin. 3. Cirrhosis of liver. Appears to be stable.  Further recommendations will depend on patient's hospital progress.  Code Status: Full code.   DVT Prophylaxis: SCDs.  Family Communication: I discussed the plan with the patient at the bedside.   Disposition Plan: Home when medically stable.   Time spent: 60 minutes.  Doree Albee [facility] Pager [phone].

## 2015-06-11 NOTE — Telephone Encounter (Signed)
Pt's wife [name] called to let us know that she is taking patient to the ED. His legs and feet are swollen and weeping fluid. He has not had his labs done yet and she said that he could have them done while at the ED. She has misplaced the patient assistance form for his medicine and he is completely out. I told her that samples are here at the front desk and I would see if JL could get him another form.

## 2015-06-11 NOTE — ED Notes (Addendum)
Bilateral pulses dopplered and found by Agricultural consultant.EDP aware.

## 2015-06-11 NOTE — ED Notes (Signed)
Having pain to right leg for last 2 days.  Rates pain 10/10.  Right leg red with raised area to lower extremity.  Pt says he has blisters to right groin.

## 2015-06-12 DIAGNOSIS — K745 Biliary cirrhosis, unspecified: Secondary | ICD-10-CM

## 2015-06-12 LAB — COMPREHENSIVE METABOLIC PANEL
ALBUMIN: 2.4 g/dL — AB (ref 3.5–5.0)
ALT: 102 U/L — ABNORMAL HIGH (ref 17–63)
ANION GAP: 5 (ref 5–15)
AST: 218 U/L — AB (ref 15–41)
Alkaline Phosphatase: 144 U/L — ABNORMAL HIGH (ref 38–126)
BILIRUBIN TOTAL: 3.8 mg/dL — AB (ref 0.3–1.2)
BUN: 14 mg/dL (ref 6–20)
CO2: 23 mmol/L (ref 22–32)
Calcium: 7.5 mg/dL — ABNORMAL LOW (ref 8.9–10.3)
Chloride: 106 mmol/L (ref 101–111)
Creatinine, Ser: 0.87 mg/dL (ref 0.61–1.24)
GFR calc Af Amer: >60 mL/min (ref >60)
GLUCOSE: 210 mg/dL — AB (ref 65–99)
POTASSIUM: 4.6 mmol/L (ref 3.5–5.1)
Sodium: 134 mmol/L — ABNORMAL LOW (ref 135–145)
TOTAL PROTEIN: 6.2 g/dL — AB (ref 6.5–8.1)

## 2015-06-12 LAB — GLUCOSE, CAPILLARY
GLUCOSE-CAPILLARY: 203 mg/dL — AB (ref 65–99)
Glucose-Capillary: 186 mg/dL — ABNORMAL HIGH (ref 65–99)
Glucose-Capillary: 256 mg/dL — ABNORMAL HIGH (ref 65–99)
Glucose-Capillary: 192 mg/dL — ABNORMAL HIGH (ref 65–99)

## 2015-06-12 LAB — CBC
HEMATOCRIT: 35.4 % — AB (ref 39.0–52.0)
Hemoglobin: 12.2 g/dL — ABNORMAL LOW (ref 13.0–17.0)
MCH: 32.9 pg (ref 26.0–34.0)
MCHC: 34.5 g/dL (ref 30.0–36.0)
MCV: 95.4 fL (ref 78.0–100.0)
Platelets: 77 10*3/uL — ABNORMAL LOW (ref 150–400)
RBC: 3.71 MIL/uL — ABNORMAL LOW (ref 4.22–5.81)
RDW: 14.4 % (ref 11.5–15.5)
WBC: 6.3 10*3/uL (ref 4.0–10.5)

## 2015-06-12 LAB — PROTIME-INR
INR: 1.21 (ref 0.00–1.49)
Prothrombin Time: 15.5 seconds — ABNORMAL HIGH (ref 11.6–15.2)

## 2015-06-12 MED ORDER — FLUCONAZOLE IN SODIUM CHLORIDE 200-0.9 MG/100ML-% IV SOLN
200.0000 mg | INTRAVENOUS | Status: DC
  Administered 2015-06-13 – 2015-06-15 (×3): 200 mg via INTRAVENOUS
  Filled 2015-06-12 (×6): qty 100

## 2015-06-12 MED ORDER — FLUCONAZOLE IN SODIUM CHLORIDE 400-0.9 MG/200ML-% IV SOLN
400.0000 mg | Freq: Once | INTRAVENOUS | Status: AC
  Administered 2015-06-12: 400 mg via INTRAVENOUS
  Filled 2015-06-12: qty 200

## 2015-06-12 MED ORDER — FLUCONAZOLE IN SODIUM CHLORIDE 200-0.9 MG/100ML-% IV SOLN: 200.0000 mg | INTRAVENOUS | Status: DC

## 2015-06-12 NOTE — Progress Notes (Signed)
[facility] PROGRESS NOTE  [name] VOH:[id number] DOB: 08/11/1961    PCP:   [name], MD   HPI: [name] is an 54 y.o. male with hx of diabetes, obesity, cirrhosis, hep C, COPD, GERD, admitted for cellulitis.  I had taken care of him previously with hepatitis and at that time, he had balanitis.  He has lower extremity redness, with sattelite lesions, and had been treated with antibiotics without sig improvement.  He was admitted into the ICU yesterday, and was given IV van/Flagyl/ and atreonam.   Rewiew of Systems:  Constitutional: Negative for malaise, fever and chills. No significant weight loss or weight gain Eyes: Negative for eye pain, redness and discharge, diplopia, visual changes, or flashes of light. ENMT: Negative for ear pain, hoarseness, nasal congestion, sinus pressure and sore throat. No headaches; tinnitus, drooling, or problem swallowing. Cardiovascular: Negative for chest pain, palpitations, diaphoresis, dyspnea and peripheral edema. ; No orthopnea, PND Respiratory: Negative for cough, hemoptysis, wheezing and stridor. No pleuritic chestpain. Gastrointestinal: Negative for nausea, vomiting, diarrhea, constipation, abdominal pain, melena, blood in stool, hematemesis, jaundice and rectal bleeding.    Genitourinary: Negative for frequency, dysuria, incontinence,flank pain and hematuria; Musculoskeletal: Negative for back pain and neck pain. Negative for swelling and trauma.;  Skin: . Negative for pruritus, rash, abrasions, bruising and skin lesion.; ulcerations Neuro: Negative for headache, lightheadedness and neck stiffness. Negative for weakness, altered level of consciousness , altered mental status, extremity weakness, burning feet, involuntary movement, seizure and syncope.  Psych: negative for anxiety, depression, insomnia, tearfulness, panic attacks, hallucinations, paranoia, suicidal or homicidal ideation    Past Medical History  Diagnosis  Date  . Hypertension   . Diabetes mellitus   . GERD (gastroesophageal reflux disease)     DEC 2010 EGD/Bx REACTIVE GASTROPATHY, NO VARICES  . Hemorrhoids, internal   . BMI 40.0-44.9, adult ([place]) OCT 2010 269 LBS    APR 2012 279 LBS AUG 2014 185 LBS  . Cirrhosis ([place]) NOV 2010 CHILD PUGH A    ETOH/HCV/OBESITY  . IV drug abuse REMOTE  . Hepatitis 2010 HEP C    AST 509 ALT 267 ALK PHOS 165 ALB 3.8 NEG IGM HAV/HBSAg  . Gallstone AUG 2012 1 CM  . GERD 10/25/2009  . COPD (chronic obstructive pulmonary disease) ([place])   . Hepatitis C   . Pancytopenia ([place]) 2013  . Splenomegaly 2013  . Other pancytopenia ([place]) 02/18/2013  . Iron (Fe) deficiency anemia 02/18/2013  . Splenomegaly 02/18/2013  . Neuropathy ([place])   . Biliary dyskinesia JUL 2015    HIDA GB EF 5%  . Chronic pain   . Chronic leg pain     left  . Portal venous hypertension (HCC)   . Asthma   . Medically noncompliant 05/30/2015    Past Surgical History  Procedure Laterality Date  . Sigmoidoscopy      2001 DR. FLEISCHMAN INTERNAL HERMORRHOIDS  . Upper gastrointestinal endoscopy  DEC 2010    BENIGN POLYPS, GASTRITIS, ?phg  . Knee surgery  RIGHT  . Hemorrhoid surgery    . Esophageal biopsy  09/08/2011    [name]:Moderate gastritis/Polyps, multiple in the body of the stomach  . Colonoscopy with propofol N/A 11/15/2013    [name]: rectal varices, small AVMs  . Esophagogastroduodenoscopy (egd) with propofol N/A 11/15/2013    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 Next EGD in April 2016.  Medications:  HOME MEDS: Prior to Admission medications   Medication Sig Start Date End Date Taking? Authorizing Provider  albuterol (PROVENTIL HFA;VENTOLIN HFA) 108 (90 BASE) MCG/ACT inhaler Inhale 2 puffs into the lungs every 6 (six) hours as needed. Shortness of breath 08/14/14  Yes [name], MD  cyclobenzaprine (FLEXERIL) 5 MG tablet Take 25 mg by mouth daily  as needed for muscle spasms.  01/25/15  Yes Historical Provider, MD  furosemide (LASIX) 20 MG tablet Take 1 tablet (20 mg total) by mouth at bedtime. 05/15/15  Yes [name], MD  glipiZIDE (GLUCOTROL) 10 MG tablet TAKE 1 TABLET (10 MG TOTAL) BY MOUTH DAILY. 06/04/15  Yes [name], MD  hydrOXYzine (ATARAX/VISTARIL) 10 MG tablet TAKE 1 TABLET (10 MG TOTAL) BY MOUTH EVERY 4 (FOUR) HOURS AS NEEDED FOR ITCHING. 05/03/15  Yes [name], MD  Insulin Glargine (LANTUS SOLOSTAR) 100 UNIT/ML Solostar Pen Inject 10 Units into the skin at bedtime.   Yes Historical Provider, MD  lactulose (CHRONULAC) 10 GM/15ML solution TAKE 15 MLS (10 G TOTAL) BY MOUTH 3 (THREE) TIMES DAILY. 06/08/15  Yes [name], MD  omeprazole (PRILOSEC) 20 MG capsule Take 2 capsules (40 mg total) by mouth daily. 10/16/14  Yes [name], MD  Oxycodone HCl 10 MG TABS Take 1 tab every 4 hours for pain Patient taking differently: Take 10 mg by mouth every 4 (four) hours as needed (for pain). Take 1 tab every 4 hours for pain 06/07/15  Yes [name], MD  rifaximin (XIFAXAN) 550 MG TABS tablet Take 1 tablet (550 mg total) by mouth 2 (two) times daily. 12/19/14  Yes [name], MD  spironolactone (ALDACTONE) 25 MG tablet TAKE ON MONDAY-WEDNESDAY-FRIDAY Patient taking differently: TAKE ONE TABLET BY MOUTH ONCE DAILY 01/22/15  Yes [name], MD  clotrimazole (LOTRIMIN) 1 % cream Apply 1 application topically 2 (two) times daily. Patient not taking: Reported on 06/11/2015 01/18/15   Orvan Falconer, MD     Allergies:  Allergies  Allergen Reactions  . Penicillins Hives    Has patient had a PCN reaction causing immediate rash, facial/tongue/throat swelling, SOB or lightheadedness with hypotension: Yes Has patient had a PCN reaction causing severe rash involving mucus membranes or skin necrosis: Yes Has patient had a PCN reaction that required hospitalization No Has patient had a PCN reaction occurring  within the last 10 years: No If all of the above answers are "NO", then may proceed with Cephalosporin use.     Social History:   reports that he has been smoking Cigarettes.  He has been smoking about 0.50 packs per day. He has never used smokeless tobacco. He reports that he does not drink alcohol or use illicit drugs.  Family History: Family History  Problem Relation Age of Onset  . Colon cancer Neg Hx   . Anesthesia problems Neg Hx   . Hypotension Neg Hx   . Malignant hyperthermia Neg Hx   . Pseudochol deficiency Neg Hx   . Cancer Father      Physical Exam: Filed Vitals:   06/12/15 0400 06/12/15 0500 06/12/15 0600 06/12/15 0805  BP: 122/79 138/94 141/80   Pulse: 92 96 92   Temp: 98.2 F (36.8 C)   97.8 F (36.6 C)  TempSrc: Oral   Oral  Resp: _0 Height:      Weight:  169.8 kg (374 lb 5.5 oz)    SpO2: 97% 96% 97%  Blood pressure 141/80, pulse 92, temperature 97.8 F (36.6 C), temperature source Oral, resp. rate 9, height _0  (1.676 m), weight 169.8 kg (374 lb 5.5 oz), SpO2 97 %.  GEN:  Pleasant  patient lying in the stretcher in no acute distress; cooperative with exam. PSYCH:  alert and oriented x4; does not appear anxious or depressed; affect is appropriate. HEENT: Mucous membranes pink and anicteric; PERRLA; EOM intact; no cervical lymphadenopathy nor thyromegaly or carotid bruit; no JVD; There were no stridor. Neck is very supple. Breasts:: Not examined CHEST WALL: No tenderness CHEST: Normal respiration, clear to auscultation bilaterally.  HEART: Regular rate and rhythm.  There are no murmur, rub, or gallops.   BACK: No kyphosis or scoliosis; no CVA tenderness ABDOMEN: soft and non-tender; no masses, no organomegaly, normal abdominal bowel sounds; no pannus; no intertriginous candida. There is no rebound and no distention. Rectal Exam: Not done EXTREMITIES: No bone or joint deformity; age-appropriate arthropathy of the hands and knees; no edema; no  ulcerations.  There is no calf tenderness. Genitalia: not examined PULSES: 2+ and symmetric SKIN: Normal hydration no rash or ulceration.  Redness, with sattelite lesions, having some on the right groin as well.  CNS: Cranial nerves 2-12 grossly intact no focal lateralizing neurologic deficit.  Speech is fluent; uvula elevated with phonation, facial symmetry and tongue midline. DTR are normal bilaterally, cerebella exam is intact, barbinski is negative and strengths are equaled bilaterally.  No sensory loss.   Labs on Admission:  Basic Metabolic Panel:  Recent Labs Lab 06/11/15 1546 06/12/15 0530  NA 134* 134*  K 4.2 4.6  CL 103 106  CO2 25 23  GLUCOSE 387* 210*  BUN 12 14  CREATININE 0.94 0.87  CALCIUM 8.1* 7.5*   Liver Function Tests:  Recent Labs Lab 06/11/15 1546 06/12/15 0530  AST 218* 218*  ALT 106* 102*  ALKPHOS 154* 144*  BILITOT 3.3* 3.8*  PROT 6.6 6.2*  ALBUMIN 2.5* 2.4*    Recent Labs Lab 06/11/15 1546  AMMONIA 39*   CBC:  Recent Labs Lab 06/11/15 1546 06/12/15 0530  WBC 6.2 6.3  NEUTROABS 4.4  --   HGB 12.2* 12.2*  HCT 36.1* 35.4*  MCV 95.3 95.4  PLT 78* 77*   Cardiac Enzymes:  Recent Labs Lab 06/11/15 1546  TROPONINI <0.03    CBG:  Recent Labs Lab 06/11/15 2105 06/12/15 0721  GLUCAP 231* 186*     Radiological Exams on Admission: Dg Tibia/fibula Right  06/11/2015  CLINICAL DATA:  Right leg cellulitis, pain and redness EXAM: RIGHT TIBIA AND FIBULA - 2 VIEW COMPARISON:  None. FINDINGS: Two views of the right tibia-fibula submitted. No acute fracture or subluxation. No evidence of osteomyelitis. Diffuse soft tissue swelling. No radiopaque foreign body. IMPRESSION: No acute fracture or subluxation.  Diffuse soft tissue swelling. Electronically Signed   By: Lahoma Crocker M.D.   On: 06/11/2015 15:47   US Scrotum  06/11/2015  CLINICAL DATA:  Two day history of right lower extremity pain and erythema. Blistering involving the right  groin. Current history of hypertension and diabetes. Current history of hepatic cirrhosis. EXAM: SCROTAL ULTRASOUND DOPPLER ULTRASOUND OF THE TESTICLES TECHNIQUE: Complete ultrasound examination of the testicles, epididymis, and other scrotal structures was performed. Color and spectral Doppler ultrasound were also utilized to evaluate blood flow to the testicles. COMPARISON:  CT abdomen pelvis 01/15/2015 which included the scrotum. FINDINGS: Right testicle Measurements: Approximately 4.8 x 1.7 x 2.6 cm. Normal parenchymal echotexture without mass or  microlithiasis. Normal color Doppler flow without evidence of hyperemia. Left testicle Measurements: Approximately 4.4 x 2.3 x 2.5 cm. Normal parenchymal echotexture without mass or microlithiasis. Intratesticular varix. Normal color Doppler flow otherwise without evidence of hyperemia. Right epididymis: Normal in size and appearance without evidence of hyperemia. Left epididymis: Normal in size and appearance without evidence of hyperemia. Hydrocele: Small right hydrocele. Moderately large left hydrocele. Present on the prior CT and likely unchanged. Varicocele:  Large left varicocele as noted on the prior CT. Pulsed Doppler interrogation of both testes demonstrates normal low resistance arterial and venous waveforms bilaterally. IMPRESSION: 1. No evidence of testicular torsion or epididymo-orchitis. 2. Large left varicocele, including an intratesticular varix. 3. Moderately large left hydrocele and small right hydrocele. 4. Above findings were present on the prior CT from June, 2016, and are not significantly changed Electronically Signed   By: Evangeline Dakin M.D.   On: 06/11/2015 17:24   US Venous Img Lower Unilateral Right  06/11/2015  CLINICAL DATA:  Chronic right leg pain and swelling.  Cellulitis. EXAM: RIGHT LOWER EXTREMITY VENOUS DOPPLER ULTRASOUND TECHNIQUE: Gray-scale sonography with graded compression, as well as color Doppler and duplex ultrasound  were performed to evaluate the lower extremity deep venous systems from the level of the common femoral vein and including the common femoral, femoral, profunda femoral, popliteal and calf veins including the posterior tibial, peroneal and gastrocnemius veins when visible. The superficial great saphenous vein was also interrogated. Spectral Doppler was utilized to evaluate flow at rest and with distal augmentation maneuvers in the common femoral, femoral and popliteal veins. The patient was unable to tolerate probe pressure in the groin and proximal thigh due to prominent soft tissue swelling and redness and weeping blisters in that area. COMPARISON:  Venous duplex ultrasound dated 11/15/2014 FINDINGS: Contralateral Common Femoral Vein: Not visualized. Common Femoral Vein: Not visualized. Saphenofemoral Junction: Not visualized. Profunda Femoral Vein: Not visualized. Femoral Vein: Proximal femoral vein was not visualized. No evidence of thrombus. Normal compressibility, respiratory phasicity and response to augmentation. Popliteal Vein: No evidence of thrombus. Normal compressibility, respiratory phasicity and response to augmentation. Calf Veins: No evidence of thrombus. Normal compressibility and flow on color Doppler imaging. The peroneal vein was not seen. Venous Reflux:  None. Other Findings:  None. IMPRESSION: No evidence of deep venous thrombosis. However, the study is limited due to the cellulitis and weeping wounds as described above. Electronically Signed   By: Lorriane Shire M.D.   On: 06/11/2015 17:28   [place] Art/ven Flow Abd Pelv Doppler  06/11/2015  CLINICAL DATA:  Two day history of right lower extremity pain and erythema. Blistering involving the right groin. Current history of hypertension and diabetes. Current history of hepatic cirrhosis. EXAM: SCROTAL ULTRASOUND DOPPLER ULTRASOUND OF THE TESTICLES TECHNIQUE: Complete ultrasound examination of the testicles, epididymis, and other scrotal  structures was performed. Color and spectral Doppler ultrasound were also utilized to evaluate blood flow to the testicles. COMPARISON:  CT abdomen pelvis 01/15/2015 which included the scrotum. FINDINGS: Right testicle Measurements: Approximately 4.8 x 1.7 x 2.6 cm. Normal parenchymal echotexture without mass or microlithiasis. Normal color Doppler flow without evidence of hyperemia. Left testicle Measurements: Approximately 4.4 x 2.3 x 2.5 cm. Normal parenchymal echotexture without mass or microlithiasis. Intratesticular varix. Normal color Doppler flow otherwise without evidence of hyperemia. Right epididymis: Normal in size and appearance without evidence of hyperemia. Left epididymis: Normal in size and appearance without evidence of hyperemia. Hydrocele: Small right hydrocele. Moderately large left hydrocele. Present on  the prior CT and likely unchanged. Varicocele:  Large left varicocele as noted on the prior CT. Pulsed Doppler interrogation of both testes demonstrates normal low resistance arterial and venous waveforms bilaterally. IMPRESSION: 1. No evidence of testicular torsion or epididymo-orchitis. 2. Large left varicocele, including an intratesticular varix. 3. Moderately large left hydrocele and small right hydrocele. 4. Above findings were present on the prior CT from June, 2016, and are not significantly changed Electronically Signed   By: Evangeline Dakin M.D.   On: 06/11/2015 17:24   Dg Chest Portable 1 View  06/11/2015  CLINICAL DATA:  Initial encounter for shortness of breath EXAM: PORTABLE CHEST 1 VIEW COMPARISON:  02/01/2015. FINDINGS: 1532 hours. Low volume film with lordotic patient positioning. Haziness over the lower lungs compatible with superimposition of soft tissues. The lungs are clear without focal pneumonia, edema, pneumothorax or pleural effusion. Cardiopericardial silhouette is at upper limits of normal for size. Telemetry leads overlie the chest. IMPRESSION: Low volume film  without acute cardiopulmonary findings. Electronically Signed   By: Misty Stanley M.D.   On: 06/11/2015 15:46   Assessment/Plan Present on Admission:  . Cellulitis . Hepatic cirrhosis (HCC)  PLAN:  I think he has fungal infection rather than bacterial cellulitis.  He has distinct sattelite lesions, and he has right groin candida cruri as well.  He did have balanitis previously.   Will add IV Diflucan with loading dose.  Will keep vancomycin, but d/c aztreonam and IV Flagyl, to de escalate regimen.  He will be transferred to telemetry today.  Need to keep his CBG under good control.  Discussed with his wife as well.  Should get biopsy of the lesion if no improvement, or worsen.   Will follow LFT CBC BMET in 2 days.  Thanks.  Other plans as per orders.  Code Status: FULL Haskel Khan, MD. [facility] Pager [phone] 7pm to 7am.  06/12/2015, 8:57 AM

## 2015-06-12 NOTE — Progress Notes (Signed)
Patient complaining of intermittent left sided rib pain. Relieved by warm packs and IV morphine. Dr. Marin Comment made aware. VSS. Patient has transfer orders to move to bed 329. Receiving nurse aware of this information. - Roselyn Reef Blayne Frankie,RN

## 2015-06-12 NOTE — Progress Notes (Signed)
Patient arrived to unit and in RM 329.  VSS. Patient complains of pain in left extremity.  Medication provided. Chart/orders reviewed and RN will continue to monitor. Oswald Hillock, RN

## 2015-06-12 NOTE — Progress Notes (Signed)
Received Report from [facility] in ED regarding patient coming to RM 329. Oswald Hillock, RN

## 2015-06-13 DIAGNOSIS — K7460 Unspecified cirrhosis of liver: Secondary | ICD-10-CM

## 2015-06-13 DIAGNOSIS — D696 Thrombocytopenia, unspecified: Secondary | ICD-10-CM | POA: Diagnosis present

## 2015-06-13 DIAGNOSIS — B182 Chronic viral hepatitis C: Secondary | ICD-10-CM

## 2015-06-13 LAB — GLUCOSE, CAPILLARY
GLUCOSE-CAPILLARY: 268 mg/dL — AB (ref 65–99)
Glucose-Capillary: 170 mg/dL — ABNORMAL HIGH (ref 65–99)
Glucose-Capillary: 206 mg/dL — ABNORMAL HIGH (ref 65–99)
Glucose-Capillary: 183 mg/dL — ABNORMAL HIGH (ref 65–99)

## 2015-06-13 LAB — URINE CULTURE

## 2015-06-13 MED ORDER — FUROSEMIDE 10 MG/ML IJ SOLN
40.0000 mg | Freq: Two times a day (BID) | INTRAMUSCULAR | Status: DC
  Administered 2015-06-13 – 2015-06-15 (×5): 40 mg via INTRAVENOUS
  Filled 2015-06-13 (×6): qty 4

## 2015-06-13 NOTE — Care Management Note (Signed)
Case Management Note  Patient Details  Name: [name] MRN: [id number] Date of Birth: [birth year]  Subjective/Objective:                  Pt admitted from home with cellulitis. Pt lives with his wife and will return home at discharge. Pt is fairly independent with ADL's.  Action/Plan: Pt would like a prescription for a cane at discharge to obtain from agency of his choice. No further CM needs noted.  Expected Discharge Date:                  Expected Discharge Plan:  Home/Self Care  In-House Referral:  NA  Discharge planning Services  CM Consult  Post Acute Care Choice:  NA Choice offered to:  NA  DME Arranged:  [name] DME Agency:     HH Arranged:    HH Agency:     Status of Service:  Completed, signed off  Medicare Important Message Given:    Date Medicare IM Given:    Medicare IM give by:    Date Additional Medicare IM Given:    Additional Medicare Important Message give by:     If discussed at [place] of Stay Meetings, dates discussed:    Additional Comments:  Joylene Draft, RN 06/13/2015, 1:09 PM

## 2015-06-13 NOTE — Progress Notes (Signed)
Inpatient Diabetes Program Recommendations  AACE/ADA: New Consensus Statement on Inpatient Glycemic Control (2015)  Target Ranges:  Prepandial:   less than 140 mg/dL      Peak postprandial:   less than 180 mg/dL (1-2 hours)      Critically ill patients:  140 - 180 mg/dL  Results for NEVAAN, BUNTON (MRN [id number]) as of 06/13/2015 08:26  Ref. Range 06/12/2015 07:21 06/12/2015 11:51 06/12/2015 16:19 06/12/2015 20:57 06/13/2015 07:44  Glucose-Capillary Latest Ref Range: 65-99 mg/dL 186 (H) 256 (H) 203 (H) 192 (H) 170 (H)   Review of Glycemic Control  Current orders for Inpatient glycemic control: Lantus 10 units daily, Novolog 0-20 units TID with meals, Novolog 0-5 units HS, Glipizide 10 mg QAM  Inpatient Diabetes Program Recommendations: Insulin - Meal Coverage: If patient is eating at least 50% of meals, please consider ordering Novolog 5 units TID with meals for meal coverage.  Thanks, Barnie Alderman, RN, MSN, CDE Diabetes Coordinator Inpatient Diabetes Program [phone] (Team Pager from [place] to [place]) [phone] ([place] office) [phone] [facility] office) [phone] [facility] [place] office)

## 2015-06-13 NOTE — Progress Notes (Signed)
[facility] PROGRESS NOTE  [name] OHY:[id number] DOB: 31-Aug-1960 DOA: 06/11/2015 PCP: [name], MD  Assessment/Plan: 1. Fungal vs bacterial cellulitis of the RLE and right groin.  Started on broad spectrum abx without much improvement so IV Diflucan was added. WBC wnl. BC pending. RLE [place] negative for DVT. Scrotal US negative for acute changes. Will continue  IV abx. Hold further IVF and start IV Lasix.  2. Hep C cirrhosis, chronic. Follow up outpatient GI.  3. DM type 2, stable. Will hold oral antihyperglycemics and continue SSI. 4. GERD, continue PPI.  5. COPD. CXR unremarkable. Will continue bronchodilators. No evidence of wheezing at this time.  6. Chronic thrombocytopenia, stable. Likely related to cirrhosis. Continue to monitor.  7. Obesity  Code Status: Full DVT prophylaxis: SCDs Family Communication: Wife at bedside. Discussed with patient who understands and has no concerns at this time. Disposition Plan: Discharge when improved.   Consultants:    Procedures:    Antibiotics:  Vancomycin 11/1>>  Fluconazole 11/1>>  Aztreonam 10/31>>11/1  Flagyl 10/31>>11/1  HPI/Subjective: Has pain to his RLE which he describes as burning. Wife reports infection looks about the same. Has had SOB since admission.   Objective: Filed Vitals:   06/13/15 0621  BP: 145/77  Pulse: 97  Temp: 97.6 F (36.4 C)  Resp: 20    Intake/Output Summary (Last 24 hours) at 06/13/15 0807 Last data filed at 06/13/15 9485  Gross per 24 hour  Intake    200 ml  Output    825 ml  Net   -625 ml   Filed Weights   06/11/15 1439 06/12/15 0500  Weight: 144.697 kg (319 lb) 169.8 kg (374 lb 5.5 oz)    Exam:   General: NAD, looks comfortable  Cardiovascular: RRR, S1, S2   Respiratory: clear bilaterally, No wheezing, rales or rhonchi  Abdomen: soft, diffusely tender w/o significant ascites , bowel sounds normal  Musculoskeletal: 2+ edema in the RLE, 1+ edema in  the LLE. Extensive erythema in the RLE with erythema and tenderness in the right groin.   Data Reviewed: Basic Metabolic Panel:  Recent Labs Lab 06/11/15 1546 06/12/15 0530  NA 134* 134*  K 4.2 4.6  CL 103 106  CO2 25 23  GLUCOSE 387* 210*  BUN 12 14  CREATININE 0.94 0.87  CALCIUM 8.1* 7.5*   Liver Function Tests:  Recent Labs Lab 06/11/15 1546 06/12/15 0530  AST 218* 218*  ALT 106* 102*  ALKPHOS 154* 144*  BILITOT 3.3* 3.8*  PROT 6.6 6.2*  ALBUMIN 2.5* 2.4*    Recent Labs Lab 06/11/15 1546  AMMONIA 39*   CBC:  Recent Labs Lab 06/11/15 1546 06/12/15 0530  WBC 6.2 6.3  NEUTROABS 4.4  --   HGB 12.2* 12.2*  HCT 36.1* 35.4*  MCV 95.3 95.4  PLT 78* 77*   Cardiac Enzymes:  Recent Labs Lab 06/11/15 1546  TROPONINI <0.03   BNP (last 3 results)  Recent Labs  06/11/15 1546  BNP 89.0    ProBNP (last 3 results)  Recent Labs  07/31/14 1220  PROBNP 582.6*    CBG:  Recent Labs Lab 06/12/15 0721 06/12/15 1151 06/12/15 1619 06/12/15 2057 06/13/15 0744  GLUCAP 186* 256* 203* 192* 170*    Recent Results (from the past 240 hour(s))  Blood culture (routine x 2)     Status: None (Preliminary result)   Collection Time: 06/11/15  3:28 PM  Result Value Ref Range Status   Specimen Description BLOOD  LEFT HAND  Final   Special Requests BOTTLES DRAWN AEROBIC ONLY [place]  Final   Culture NO GROWTH < 24 HOURS  Final   Report Status PENDING  Incomplete  Blood culture (routine x 2)     Status: None (Preliminary result)   Collection Time: 06/11/15  3:47 PM  Result Value Ref Range Status   Specimen Description BLOOD RIGHT ANTECUBITAL  Final   Special Requests BOTTLES DRAWN AEROBIC AND ANAEROBIC [place]  Final   Culture NO GROWTH < 24 HOURS  Final   Report Status PENDING  Incomplete  Urine culture     Status: None (Preliminary result)   Collection Time: 06/11/15  4:00 PM  Result Value Ref Range Status   Specimen Description URINE, CLEAN CATCH  Final    Special Requests NONE  Final   Culture   Final    CULTURE REINCUBATED FOR BETTER GROWTH Performed at [facility]    Report Status PENDING  Incomplete  MRSA PCR Screening     Status: None   Collection Time: 06/11/15  7:43 PM  Result Value Ref Range Status   MRSA by PCR NEGATIVE NEGATIVE Final    Comment:        The GeneXpert MRSA Assay (FDA approved for NASAL specimens only), is one component of a comprehensive MRSA colonization surveillance program. It is not intended to diagnose MRSA infection nor to guide or monitor treatment for MRSA infections.      Studies: Dg Tibia/fibula Right  06/11/2015  CLINICAL DATA:  Right leg cellulitis, pain and redness EXAM: RIGHT TIBIA AND FIBULA - 2 VIEW COMPARISON:  None. FINDINGS: Two views of the right tibia-fibula submitted. No acute fracture or subluxation. No evidence of osteomyelitis. Diffuse soft tissue swelling. No radiopaque foreign body. IMPRESSION: No acute fracture or subluxation.  Diffuse soft tissue swelling. Electronically Signed   By: Lahoma Crocker M.D.   On: 06/11/2015 15:47   US Scrotum  06/11/2015  CLINICAL DATA:  Two day history of right lower extremity pain and erythema. Blistering involving the right groin. Current history of hypertension and diabetes. Current history of hepatic cirrhosis. EXAM: SCROTAL ULTRASOUND DOPPLER ULTRASOUND OF THE TESTICLES TECHNIQUE: Complete ultrasound examination of the testicles, epididymis, and other scrotal structures was performed. Color and spectral Doppler ultrasound were also utilized to evaluate blood flow to the testicles. COMPARISON:  CT abdomen pelvis 01/15/2015 which included the scrotum. FINDINGS: Right testicle Measurements: Approximately 4.8 x 1.7 x 2.6 cm. Normal parenchymal echotexture without mass or microlithiasis. Normal color Doppler flow without evidence of hyperemia. Left testicle Measurements: Approximately 4.4 x 2.3 x 2.5 cm. Normal parenchymal echotexture without mass or  microlithiasis. Intratesticular varix. Normal color Doppler flow otherwise without evidence of hyperemia. Right epididymis: Normal in size and appearance without evidence of hyperemia. Left epididymis: Normal in size and appearance without evidence of hyperemia. Hydrocele: Small right hydrocele. Moderately large left hydrocele. Present on the prior CT and likely unchanged. Varicocele:  Large left varicocele as noted on the prior CT. Pulsed Doppler interrogation of both testes demonstrates normal low resistance arterial and venous waveforms bilaterally. IMPRESSION: 1. No evidence of testicular torsion or epididymo-orchitis. 2. Large left varicocele, including an intratesticular varix. 3. Moderately large left hydrocele and small right hydrocele. 4. Above findings were present on the prior CT from June, 2016, and are not significantly changed Electronically Signed   By: Evangeline Dakin M.D.   On: 06/11/2015 17:24   US Venous Img Lower Unilateral Right  06/11/2015  CLINICAL DATA:  Chronic right leg pain and swelling.  Cellulitis. EXAM: RIGHT LOWER EXTREMITY VENOUS DOPPLER ULTRASOUND TECHNIQUE: Gray-scale sonography with graded compression, as well as color Doppler and duplex ultrasound were performed to evaluate the lower extremity deep venous systems from the level of the common femoral vein and including the common femoral, femoral, profunda femoral, popliteal and calf veins including the posterior tibial, peroneal and gastrocnemius veins when visible. The superficial great saphenous vein was also interrogated. Spectral Doppler was utilized to evaluate flow at rest and with distal augmentation maneuvers in the common femoral, femoral and popliteal veins. The patient was unable to tolerate probe pressure in the groin and proximal thigh due to prominent soft tissue swelling and redness and weeping blisters in that area. COMPARISON:  Venous duplex ultrasound dated 11/15/2014 FINDINGS: Contralateral Common Femoral  Vein: Not visualized. Common Femoral Vein: Not visualized. Saphenofemoral Junction: Not visualized. Profunda Femoral Vein: Not visualized. Femoral Vein: Proximal femoral vein was not visualized. No evidence of thrombus. Normal compressibility, respiratory phasicity and response to augmentation. Popliteal Vein: No evidence of thrombus. Normal compressibility, respiratory phasicity and response to augmentation. Calf Veins: No evidence of thrombus. Normal compressibility and flow on color Doppler imaging. The peroneal vein was not seen. Venous Reflux:  None. Other Findings:  None. IMPRESSION: No evidence of deep venous thrombosis. However, the study is limited due to the cellulitis and weeping wounds as described above. Electronically Signed   By: Lorriane Shire M.D.   On: 06/11/2015 17:28   [place] Art/ven Flow Abd Pelv Doppler  06/11/2015  CLINICAL DATA:  Two day history of right lower extremity pain and erythema. Blistering involving the right groin. Current history of hypertension and diabetes. Current history of hepatic cirrhosis. EXAM: SCROTAL ULTRASOUND DOPPLER ULTRASOUND OF THE TESTICLES TECHNIQUE: Complete ultrasound examination of the testicles, epididymis, and other scrotal structures was performed. Color and spectral Doppler ultrasound were also utilized to evaluate blood flow to the testicles. COMPARISON:  CT abdomen pelvis 01/15/2015 which included the scrotum. FINDINGS: Right testicle Measurements: Approximately 4.8 x 1.7 x 2.6 cm. Normal parenchymal echotexture without mass or microlithiasis. Normal color Doppler flow without evidence of hyperemia. Left testicle Measurements: Approximately 4.4 x 2.3 x 2.5 cm. Normal parenchymal echotexture without mass or microlithiasis. Intratesticular varix. Normal color Doppler flow otherwise without evidence of hyperemia. Right epididymis: Normal in size and appearance without evidence of hyperemia. Left epididymis: Normal in size and appearance without evidence of  hyperemia. Hydrocele: Small right hydrocele. Moderately large left hydrocele. Present on the prior CT and likely unchanged. Varicocele:  Large left varicocele as noted on the prior CT. Pulsed Doppler interrogation of both testes demonstrates normal low resistance arterial and venous waveforms bilaterally. IMPRESSION: 1. No evidence of testicular torsion or epididymo-orchitis. 2. Large left varicocele, including an intratesticular varix. 3. Moderately large left hydrocele and small right hydrocele. 4. Above findings were present on the prior CT from June, 2016, and are not significantly changed Electronically Signed   By: Evangeline Dakin M.D.   On: 06/11/2015 17:24   Dg Chest Portable 1 View  06/11/2015  CLINICAL DATA:  Initial encounter for shortness of breath EXAM: PORTABLE CHEST 1 VIEW COMPARISON:  02/01/2015. FINDINGS: 1532 hours. Low volume film with lordotic patient positioning. Haziness over the lower lungs compatible with superimposition of soft tissues. The lungs are clear without focal pneumonia, edema, pneumothorax or pleural effusion. Cardiopericardial silhouette is at upper limits of normal for size. Telemetry leads overlie the chest. IMPRESSION: Low volume film without  acute cardiopulmonary findings. Electronically Signed   By: Misty Stanley M.D.   On: 06/11/2015 15:46    Scheduled Meds: . fluconazole (DIFLUCAN) IV  200 mg Intravenous Q24H  . furosemide  20 mg Oral QHS  . glipiZIDE  10 mg Oral QAC breakfast  . insulin aspart  0-20 Units Subcutaneous TID WC  . insulin aspart  0-5 Units Subcutaneous QHS  . insulin glargine  10 Units Subcutaneous Daily  . lactulose  10 g Oral TID  . pantoprazole  40 mg Oral Daily  . rifaximin  550 mg Oral BID  . sodium chloride  3 mL Intravenous Q12H  . spironolactone  25 mg Oral Daily  . vancomycin  1,500 mg Intravenous Q12H   Continuous Infusions:   Active Problems:   Hepatic cirrhosis (HCC)   Diabetes ([place])   Cellulitis   Cellulitis of right  leg    Time spent: 30 minutes   Aristidis Talerico. MD [facility] Pager [phone]. If 7PM-7AM, please contact night-coverage at [email or web address], password [facility] 06/13/2015, 8:07 AM  LOS: 2 days     By signing my name below, I, Rosalie Doctor, attest that this documentation has been prepared under the direction and in the presence of [facility]. MD Electronically Signed: Rosalie Doctor, Scribe. 06/13/2015  10:53am  I, Dr. Kathie Dike, personally performed the services described in this documentaiton. All medical record entries made by the scribe were at my direction and in my presence. I have reviewed the chart and agree that the record reflects my personal performance and is accurate and complete  Kathie Dike, MD, 06/13/2015 11:30 AM

## 2015-06-14 LAB — CBC
HEMATOCRIT: 31.5 % — AB (ref 39.0–52.0)
HEMOGLOBIN: 10.6 g/dL — AB (ref 13.0–17.0)
MCH: 31.7 pg (ref 26.0–34.0)
MCHC: 33.7 g/dL (ref 30.0–36.0)
MCV: 94.3 fL (ref 78.0–100.0)
Platelets: 73 10*3/uL — ABNORMAL LOW (ref 150–400)
RBC: 3.34 MIL/uL — AB (ref 4.22–5.81)
RDW: 14.3 % (ref 11.5–15.5)
WBC: 3.9 10*3/uL — AB (ref 4.0–10.5)

## 2015-06-14 LAB — BASIC METABOLIC PANEL
ANION GAP: 4 — AB (ref 5–15)
BUN: 24 mg/dL — ABNORMAL HIGH (ref 6–20)
CALCIUM: 7.6 mg/dL — AB (ref 8.9–10.3)
CO2: 26 mmol/L (ref 22–32)
Chloride: 103 mmol/L (ref 101–111)
Creatinine, Ser: 1.07 mg/dL (ref 0.61–1.24)
GLUCOSE: 117 mg/dL — AB (ref 65–99)
POTASSIUM: 4.3 mmol/L (ref 3.5–5.1)
SODIUM: 133 mmol/L — AB (ref 135–145)

## 2015-06-14 LAB — GLUCOSE, CAPILLARY
GLUCOSE-CAPILLARY: 205 mg/dL — AB (ref 65–99)
GLUCOSE-CAPILLARY: 104 mg/dL — AB (ref 65–99)
GLUCOSE-CAPILLARY: 154 mg/dL — AB (ref 65–99)
Glucose-Capillary: 116 mg/dL — ABNORMAL HIGH (ref 65–99)

## 2015-06-14 MED ORDER — LEVOFLOXACIN IN D5W 750 MG/150ML IV SOLN
750.0000 mg | INTRAVENOUS | Status: DC
  Administered 2015-06-14 – 2015-06-16 (×3): 750 mg via INTRAVENOUS
  Filled 2015-06-14 (×4): qty 150

## 2015-06-14 NOTE — Progress Notes (Signed)
Condom cath off, patient wanting to use urinal during daytime and possibly condom cath at night for rest.  Requesting Flexeril and warm pack for cramping in right hand. Given.  Will continue to monitor.  Wife at bedside.

## 2015-06-14 NOTE — Progress Notes (Signed)
[facility] PROGRESS NOTE  [name] [place] HFW:[id number] DOB: October 26, 1960 DOA: 06/11/2015 PCP: [name], MD  Assessment/Plan: 1. Cellulitis of the RLE and right groin. Will continue Vancomycin but will add Levofloxacin to abx regimen and continue lasix. BC pending, with no growth within two days. RLE [place] negative for DVT. Scrotal US negative for acute changes. General surgery consultation requested to see if debridement/exporation of cellulitis is necessary.  2. Hep C cirrhosis, chronic. Follow up outpatient GI.  3. DM type 2, stable. Will continue to hold oral antihyperglycemics and continue SSI. 4. GERD, continue PPI.  5. COPD. Continue bronchodilators. No evidence of wheezing at this time.  6. Chronic thrombocytopenia, stable. Likely related to cirrhosis. Continue to monitor.  7. Obesity.  Code Status: Full DVT prophylaxis: SCDs Family Communication: Wife at bedside. Discussed with patient who understands and has no concerns at this time. Disposition Plan: Anticipate discharge within 2-3 days.   Consultants:    Procedures:    Antibiotics:  Vancomycin 11/1>>  Fluconazole 11/1>>  Aztreonam 10/31>>11/1  Flagyl 10/31>>11/1  HPI/Subjective: Feeling pretty good but is still swollen and blisters are still present. Mild pain in the LE.   Objective: Filed Vitals:   06/14/15 0613  BP: 132/66  Pulse: 86  Temp: 98.4 F (36.9 C)  Resp: 20    Intake/Output Summary (Last 24 hours) at 06/14/15 0714 Last data filed at 06/14/15 0640  Gross per 24 hour  Intake   1703 ml  Output   2600 ml  Net   -897 ml   Filed Weights   06/11/15 1439 06/12/15 0500 06/14/15 0613  Weight: 144.697 kg (319 lb) 169.8 kg (374 lb 5.5 oz) 150.186 kg (331 lb 1.6 oz)    Exam:  General: NAD. Sitting up in bed.  Cardiovascular: RRR, S1, S2  Respiratory: clear bilaterally, No wheezing, rales or rhonchi Abdomen: soft, non tender, no distention , bowel sounds  normal Musculoskeletal: Extensive erythema over the RLE, large blister noted on the under surface of his right calf. Erythema is also present on the right groin.   Data Reviewed: Basic Metabolic Panel:  Recent Labs Lab 06/11/15 1546 06/12/15 0530 06/14/15 0544  NA 134* 134* 133*  K 4.2 4.6 4.3  CL 103 106 103  CO2 25 23 26   GLUCOSE 387* 210* 117*  BUN 12 14 24*  CREATININE 0.94 0.87 1.07  CALCIUM 8.1* 7.5* 7.6*   Liver Function Tests:  Recent Labs Lab 06/11/15 1546 06/12/15 0530  AST 218* 218*  ALT 106* 102*  ALKPHOS 154* 144*  BILITOT 3.3* 3.8*  PROT 6.6 6.2*  ALBUMIN 2.5* 2.4*    Recent Labs Lab 06/11/15 1546  AMMONIA 39*   CBC:  Recent Labs Lab 06/11/15 1546 06/12/15 0530 06/14/15 0544  WBC 6.2 6.3 3.9*  NEUTROABS 4.4  --   --   HGB 12.2* 12.2* 10.6*  HCT 36.1* 35.4* 31.5*  MCV 95.3 95.4 94.3  PLT 78* 77* 73*   Cardiac Enzymes:  Recent Labs Lab 06/11/15 1546  TROPONINI <0.03   BNP (last 3 results)  Recent Labs  06/11/15 1546  BNP 89.0    ProBNP (last 3 results)  Recent Labs  07/31/14 1220  PROBNP 582.6*    CBG:  Recent Labs Lab 06/12/15 2057 06/13/15 0744 06/13/15 1140 06/13/15 1633 06/13/15 2025  GLUCAP 192* 170* 268* 206* 183*    Recent Results (from the past 240 hour(s))  Blood culture (routine x 2)     Status: None (Preliminary  result)   Collection Time: 06/11/15  3:28 PM  Result Value Ref Range Status   Specimen Description BLOOD LEFT HAND  Final   Special Requests BOTTLES DRAWN AEROBIC ONLY [place]  Final   Culture NO GROWTH 2 DAYS  Final   Report Status PENDING  Incomplete  Blood culture (routine x 2)     Status: None (Preliminary result)   Collection Time: 06/11/15  3:47 PM  Result Value Ref Range Status   Specimen Description BLOOD RIGHT ANTECUBITAL  Final   Special Requests BOTTLES DRAWN AEROBIC AND ANAEROBIC [place]  Final   Culture NO GROWTH 2 DAYS  Final   Report Status PENDING  Incomplete  Urine  culture     Status: None   Collection Time: 06/11/15  4:00 PM  Result Value Ref Range Status   Specimen Description URINE, CLEAN CATCH  Final   Special Requests NONE  Final   Culture   Final    MULTIPLE SPECIES PRESENT, SUGGEST RECOLLECTION Performed at [facility]    Report Status 06/13/2015 FINAL  Final  MRSA PCR Screening     Status: None   Collection Time: 06/11/15  7:43 PM  Result Value Ref Range Status   MRSA by PCR NEGATIVE NEGATIVE Final    Comment:        The GeneXpert MRSA Assay (FDA approved for NASAL specimens only), is one component of a comprehensive MRSA colonization surveillance program. It is not intended to diagnose MRSA infection nor to guide or monitor treatment for MRSA infections.      Studies: No results found.  Scheduled Meds: . fluconazole (DIFLUCAN) IV  200 mg Intravenous Q24H  . furosemide  40 mg Intravenous BID  . insulin aspart  0-20 Units Subcutaneous TID WC  . insulin aspart  0-5 Units Subcutaneous QHS  . insulin glargine  10 Units Subcutaneous Daily  . lactulose  10 g Oral TID  . pantoprazole  40 mg Oral Daily  . rifaximin  550 mg Oral BID  . sodium chloride  3 mL Intravenous Q12H  . spironolactone  25 mg Oral Daily  . vancomycin  1,500 mg Intravenous Q12H   Continuous Infusions:   Active Problems:   Hepatic cirrhosis (HCC)   Diabetes ([place])   Chronic hepatitis C with cirrhosis ([place])   Cellulitis   Cellulitis of right leg   Thrombocytopenia ([place])    Time spent: 25 minutes   Makaylen Thieme. MD [facility] Pager [phone]. If 7PM-7AM, please contact night-coverage at [email or web address], password [email or web address] 06/14/2015, 7:14 AM  LOS: 3 days      By signing my name below, I, Rennis Harding, attest that this documentation has been prepared under the direction and in the presence of Kathie Dike, MD. Electronically signed: Rennis Harding, Scribe. 06/14/2015 12:34pm    I, Dr. Kathie Dike, personally performed the  services described in this documentaiton. All medical record entries made by the scribe were at my direction and in my presence. I have reviewed the chart and agree that the record reflects my personal performance and is accurate and complete  Kathie Dike, MD, 06/14/2015 12:47 PM

## 2015-06-14 NOTE — Progress Notes (Signed)
Inpatient Diabetes Program Recommendations  AACE/ADA: New Consensus Statement on Inpatient Glycemic Control (2015)  Target Ranges:  Prepandial:   less than 140 mg/dL      Peak postprandial:   less than 180 mg/dL (1-2 hours)      Critically ill patients:  140 - 180 mg/dL   Results for LAJARVIS, ITALIANO (MRN [id number]) as of 06/14/2015 08:20  Ref. Range 06/13/2015 07:44 06/13/2015 11:40 06/13/2015 16:33 06/13/2015 20:25 06/14/2015 07:15  Glucose-Capillary Latest Ref Range: 65-99 mg/dL 170 (H) 268 (H) 206 (H) 183 (H) 116 (H)  Results for CRISTOBAL, ADVANI (MRN [id number]) as of 06/14/2015 08:20  Ref. Range 06/12/2015 07:21 06/12/2015 11:51 06/12/2015 16:19 06/12/2015 20:57  Glucose-Capillary Latest Ref Range: 65-99 mg/dL 186 (H) 256 (H) 203 (H) 192 (H)   Review of Glycemic Control  Current orders for Inpatient glycemic control: Lantus 10 units daily, Novolog 0-20 units TID with meals, Novolog 0-5 units HS, Glipizide 10 mg QAM  Inpatient Diabetes Program Recommendations: Insulin - Meal Coverage: Post prandial glucose is consistently elevated. If patient is eating at least 50% of meals, please consider ordering Novolog 5 units TID with meals for meal coverage.  Thanks, Barnie Alderman, RN, MSN, CDE Diabetes Coordinator Inpatient Diabetes Program [phone] (Team Pager from [place] to [place]) [phone] ([facility] office) [phone] [facility] office) [phone] [facility] office)

## 2015-06-15 LAB — BASIC METABOLIC PANEL
ANION GAP: 5 (ref 5–15)
BUN: 25 mg/dL — ABNORMAL HIGH (ref 6–20)
CHLORIDE: 102 mmol/L (ref 101–111)
CO2: 27 mmol/L (ref 22–32)
CREATININE: 1.12 mg/dL (ref 0.61–1.24)
Calcium: 7.8 mg/dL — ABNORMAL LOW (ref 8.9–10.3)
GFR calc non Af Amer: >60 mL/min (ref >60)
Glucose, Bld: 104 mg/dL — ABNORMAL HIGH (ref 65–99)
Potassium: 4.4 mmol/L (ref 3.5–5.1)
SODIUM: 134 mmol/L — AB (ref 135–145)

## 2015-06-15 LAB — GLUCOSE, CAPILLARY
GLUCOSE-CAPILLARY: 107 mg/dL — AB (ref 65–99)
Glucose-Capillary: 168 mg/dL — ABNORMAL HIGH (ref 65–99)
Glucose-Capillary: 128 mg/dL — ABNORMAL HIGH (ref 65–99)
Glucose-Capillary: 112 mg/dL — ABNORMAL HIGH (ref 65–99)

## 2015-06-15 LAB — VANCOMYCIN, TROUGH: VANCOMYCIN TR: 22 ug/mL — AB (ref 10.0–20.0)

## 2015-06-15 MED ORDER — CLINDAMYCIN PHOSPHATE 900 MG/50ML IV SOLN
900.0000 mg | Freq: Three times a day (TID) | INTRAVENOUS | Status: DC
  Administered 2015-06-15 – 2015-06-16 (×4): 900 mg via INTRAVENOUS
  Filled 2015-06-15 (×10): qty 50

## 2015-06-15 MED ORDER — VANCOMYCIN HCL 10 G IV SOLR
2000.0000 mg | INTRAVENOUS | Status: DC
  Administered 2015-06-15 – 2015-06-18 (×4): 2000 mg via INTRAVENOUS
  Filled 2015-06-15 (×3): qty 2000
  Filled 2015-06-15: qty 500

## 2015-06-15 MED ORDER — FUROSEMIDE 10 MG/ML IJ SOLN: 80.0000 mg | Freq: Two times a day (BID) | INTRAMUSCULAR | Status: DC

## 2015-06-15 MED ORDER — MILK AND MOLASSES ENEMA
1.0000 | Freq: Once | RECTAL | Status: DC
Start: 2015-06-15 — End: 2015-06-21

## 2015-06-15 NOTE — Progress Notes (Signed)
[facility] PROGRESS NOTE  [name] VHQ:[id number] DOB: 03-Oct-1960 DOA: 06/11/2015 PCP: [name], MD  Assessment/Plan: 1. Cellulitis of the RLE and right groin. Surgery input appreciated, they do not recommend any debridement at this time.  Will continue Vancomycin/Levaquin and add on  Clindamycin. BC pending, with no growth to date. RLE [place] negative for DVT. Scrotal US negative for acute changes.  Will increase IV Lasix.  2. Constipation, last BM 1 week ago. Will give enema. 3. Hep C cirrhosis, chronic. Follow up outpatient GI.  4. DM type 2, stable. Will continue to hold oral antihyperglycemics and continue SSI. 5. GERD, continue PPI.  6. COPD. Continue bronchodilators. No evidence of wheezing at this time.  7. Chronic thrombocytopenia, stable. Likely related to cirrhosis. Continue to monitor.  8. Obesity.  Code Status: Full DVT prophylaxis: SCDs Family Communication: Wife at bedside. Discussed with patient who understands and has no concerns at this time. Disposition Plan: Anticipate discharge within 2-3 days.   Consultants:  Surgery  Procedures:    Antibiotics:  Vancomycin 11/1>>   Fluconazole 11/1>>11/4  Aztreonam 10/31>>11/1  Flagyl 10/31>>11/1  Levofloxacin 11/3>>   Clindamycin 11/4>>  HPI/Subjective: States his RLE appears to be improving. His pain is tolerable with pain meds however it is worse with ambulation and feels like a throbbing.   Objective: Filed Vitals:   06/15/15 0440  BP: 167/72  Pulse: 90  Temp: 98.6 F (37 C)  Resp: 20    Intake/Output Summary (Last 24 hours) at 06/15/15 0801 Last data filed at 06/15/15 4132  Gross per 24 hour  Intake   1680 ml  Output   2075 ml  Net   -395 ml   Filed Weights   06/12/15 0500 06/14/15 0613 06/15/15 0436  Weight: 169.8 kg (374 lb 5.5 oz) 150.186 kg (331 lb 1.6 oz) 150.503 kg (331 lb 12.8 oz)    Exam:  General: NAD. Sitting up in bed.  Cardiovascular: RRR, S1, S2   Respiratory: clear bilaterally, No wheezing, rales or rhonchi Abdomen: soft, non tender, no distention , bowel sounds normal Musculoskeletal: Extensive erythema appears to be unchanged from yesterday.  Data Reviewed: Basic Metabolic Panel:  Recent Labs Lab 06/11/15 1546 06/12/15 0530 06/14/15 0544 06/15/15 0621  NA 134* 134* 133* 134*  K 4.2 4.6 4.3 4.4  CL 103 106 103 102  CO2 25 23 26 27   GLUCOSE 387* 210* 117* 104*  BUN 12 14 24* 25*  CREATININE 0.94 0.87 1.07 1.12  CALCIUM 8.1* 7.5* 7.6* 7.8*   Liver Function Tests:  Recent Labs Lab 06/11/15 1546 06/12/15 0530  AST 218* 218*  ALT 106* 102*  ALKPHOS 154* 144*  BILITOT 3.3* 3.8*  PROT 6.6 6.2*  ALBUMIN 2.5* 2.4*    Recent Labs Lab 06/11/15 1546  AMMONIA 39*   CBC:  Recent Labs Lab 06/11/15 1546 06/12/15 0530 06/14/15 0544  WBC 6.2 6.3 3.9*  NEUTROABS 4.4  --   --   HGB 12.2* 12.2* 10.6*  HCT 36.1* 35.4* 31.5*  MCV 95.3 95.4 94.3  PLT 78* 77* 73*   Cardiac Enzymes:  Recent Labs Lab 06/11/15 1546  TROPONINI <0.03   BNP (last 3 results)  Recent Labs  06/11/15 1546  BNP 89.0    ProBNP (last 3 results)  Recent Labs  07/31/14 1220  PROBNP 582.6*    CBG:  Recent Labs Lab 06/14/15 0715 06/14/15 1116 06/14/15 1621 06/14/15 2039 06/15/15 0716  GLUCAP 116* 205* 104* 154* 107*  Recent Results (from the past 240 hour(s))  Blood culture (routine x 2)     Status: None (Preliminary result)   Collection Time: 06/11/15  3:28 PM  Result Value Ref Range Status   Specimen Description BLOOD LEFT HAND  Final   Special Requests BOTTLES DRAWN AEROBIC ONLY [place]  Final   Culture NO GROWTH 4 DAYS  Final   Report Status PENDING  Incomplete  Blood culture (routine x 2)     Status: None (Preliminary result)   Collection Time: 06/11/15  3:47 PM  Result Value Ref Range Status   Specimen Description BLOOD RIGHT ANTECUBITAL  Final   Special Requests BOTTLES DRAWN AEROBIC AND ANAEROBIC [place]   Final   Culture NO GROWTH 4 DAYS  Final   Report Status PENDING  Incomplete  Urine culture     Status: None   Collection Time: 06/11/15  4:00 PM  Result Value Ref Range Status   Specimen Description URINE, CLEAN CATCH  Final   Special Requests NONE  Final   Culture   Final    MULTIPLE SPECIES PRESENT, SUGGEST RECOLLECTION Performed at [facility]    Report Status 06/13/2015 FINAL  Final  MRSA PCR Screening     Status: None   Collection Time: 06/11/15  7:43 PM  Result Value Ref Range Status   MRSA by PCR NEGATIVE NEGATIVE Final    Comment:        The GeneXpert MRSA Assay (FDA approved for NASAL specimens only), is one component of a comprehensive MRSA colonization surveillance program. It is not intended to diagnose MRSA infection nor to guide or monitor treatment for MRSA infections.      Studies: No results found.  Scheduled Meds: . fluconazole (DIFLUCAN) IV  200 mg Intravenous Q24H  . furosemide  40 mg Intravenous BID  . insulin aspart  0-20 Units Subcutaneous TID WC  . insulin aspart  0-5 Units Subcutaneous QHS  . insulin glargine  10 Units Subcutaneous Daily  . lactulose  10 g Oral TID  . levofloxacin (LEVAQUIN) IV  750 mg Intravenous Q24H  . pantoprazole  40 mg Oral Daily  . rifaximin  550 mg Oral BID  . sodium chloride  3 mL Intravenous Q12H  . spironolactone  25 mg Oral Daily  . vancomycin  1,500 mg Intravenous Q12H   Continuous Infusions:   Active Problems:   Hepatic cirrhosis (HCC)   Diabetes ([place])   Chronic hepatitis C with cirrhosis (HCC)   Cellulitis   Cellulitis of right leg   Thrombocytopenia ([place])    Time spent: 20 minutes   [name]. MD [facility] Pager [phone]. If 7PM-7AM, please contact night-coverage at [email or web address], password [facility] 06/15/2015, 8:01 AM  LOS: 4 days     By signing my name below, I, Rosalie Doctor, attest that this documentation has been prepared under the direction and in the presence of  [employer]. MD Electronically Signed: Rosalie Doctor, Scribe. 06/15/2015 12:20pm   I, Dr. Kathie Dike, personally performed the services described in this documentaiton. All medical record entries made by the scribe were at my direction and in my presence. I have reviewed the chart and agree that the record reflects my personal performance and is accurate and complete  Kathie Dike, MD, 06/15/2015 12:39 PM

## 2015-06-15 NOTE — Consult Note (Signed)
Reason for Consult: Cellulitis, right lower extremity and right groin Referring Physician: Hospitalist  [name] is an 54 y.o. male.  HPI: Patient is a 54 year old obese white male with multiple medical problems including hepatitis, cirrhosis, and diabetes mellitus who presented for evaluation treatment of right lower extremity cellulitis. He states he has had the redness in the right lower extremity for many months. Recently, he had worsened and he presented the emergency room for further evaluation treatment. He also complains about penile and scrotal irritation. He was scheduled to see a urologist as an outpatient for this. Workup thus far has been negative for DVT. No growth has been noted on cultures.  Past Medical History  Diagnosis Date  . Hypertension   . Diabetes mellitus   . GERD (gastroesophageal reflux disease)     DEC 2010 EGD/Bx REACTIVE GASTROPATHY, NO VARICES  . Hemorrhoids, internal   . BMI 40.0-44.9, adult ([place]) OCT 2010 269 LBS    APR 2012 279 LBS AUG 2014 185 LBS  . Cirrhosis ([place]) NOV 2010 CHILD PUGH A    ETOH/HCV/OBESITY  . IV drug abuse REMOTE  . Hepatitis 2010 HEP C    AST 509 ALT 267 ALK PHOS 165 ALB 3.8 NEG IGM HAV/HBSAg  . Gallstone AUG 2012 1 CM  . GERD 10/25/2009  . COPD (chronic obstructive pulmonary disease) ([place])   . Hepatitis C   . Pancytopenia ([place]) 2013  . Splenomegaly 2013  . Other pancytopenia ([place]) 02/18/2013  . Iron (Fe) deficiency anemia 02/18/2013  . Splenomegaly 02/18/2013  . Neuropathy ([employer])   . Biliary dyskinesia JUL 2015    HIDA GB EF 5%  . Chronic pain   . Chronic leg pain     left  . Portal venous hypertension (HCC)   . Asthma   . Medically noncompliant 05/30/2015    Past Surgical History  Procedure Laterality Date  . Sigmoidoscopy      2001 DR. FLEISCHMAN INTERNAL HERMORRHOIDS  . Upper gastrointestinal endoscopy  DEC 2010    BENIGN POLYPS, GASTRITIS, ?phg  . Knee surgery  RIGHT  . Hemorrhoid surgery    . Esophageal  biopsy  09/08/2011    [name]:Moderate gastritis/Polyps, multiple in the body of the stomach  . Colonoscopy with propofol N/A 11/15/2013    [name]: rectal varices, small AVMs  . Esophagogastroduodenoscopy (egd) with propofol N/A 11/15/2013    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 Next EGD in April 2016.      Family History  Problem Relation Age of Onset  . Colon cancer Neg Hx   . Anesthesia problems Neg Hx   . Hypotension Neg Hx   . Malignant hyperthermia Neg Hx   . Pseudochol deficiency Neg Hx   . Cancer Father     Social History:  reports that he has been smoking Cigarettes.  He has been smoking about 0.50 packs per day. He has never used smokeless tobacco. He reports that he does not drink alcohol or use illicit drugs.  Allergies:  Allergies  Allergen Reactions  . Penicillins Hives    Has patient had a PCN reaction causing immediate rash, facial/tongue/throat swelling, SOB or lightheadedness with hypotension: Yes Has patient had a PCN reaction causing severe rash involving mucus membranes or skin necrosis: Yes Has patient had a PCN reaction that required hospitalization No Has patient had a PCN reaction occurring within the last 10 years: No If all of the above  answers are "NO", then may proceed with Cephalosporin use.     Medications: I have reviewed the patient's current medications.  Results for orders placed or performed during the hospital encounter of 06/11/15 (from the past 48 hour(s))  Glucose, capillary     Status: Abnormal   Collection Time: 06/13/15 11:40 AM  Result Value Ref Range   Glucose-Capillary 268 (H) 65 - 99 mg/dL  Glucose, capillary     Status: Abnormal   Collection Time: 06/13/15  4:33 PM  Result Value Ref Range   Glucose-Capillary 206 (H) 65 - 99 mg/dL   Comment 1 Notify RN    Comment 2 Document in Chart   Glucose, capillary     Status: Abnormal   Collection Time: 06/13/15   8:25 PM  Result Value Ref Range   Glucose-Capillary 183 (H) 65 - 99 mg/dL   Comment 1 Notify RN    Comment 2 Document in Chart   Basic metabolic panel     Status: Abnormal   Collection Time: 06/14/15  5:44 AM  Result Value Ref Range   Sodium 133 (L) 135 - 145 mmol/L   Potassium 4.3 3.5 - 5.1 mmol/L   Chloride 103 101 - 111 mmol/L   CO2 26 22 - 32 mmol/L   Glucose, Bld 117 (H) 65 - 99 mg/dL   BUN 24 (H) 6 - 20 mg/dL   Creatinine, Ser 1.07 0.61 - 1.24 mg/dL   Calcium 7.6 (L) 8.9 - 10.3 mg/dL   GFR calc non Af Amer >60 >60 mL/min   GFR calc Af Amer >60 >60 mL/min    Comment: (NOTE) The eGFR has been calculated using the CKD EPI equation. This calculation has not been validated in all clinical situations. eGFR's persistently <60 mL/min signify possible Chronic Kidney Disease.    Anion gap 4 (L) 5 - 15  CBC     Status: Abnormal   Collection Time: 06/14/15  5:44 AM  Result Value Ref Range   WBC 3.9 (L) 4.0 - 10.5 K/uL   RBC 3.34 (L) 4.22 - 5.81 MIL/uL   Hemoglobin 10.6 (L) 13.0 - 17.0 g/dL   HCT 31.5 (L) 39.0 - 52.0 %   MCV 94.3 78.0 - 100.0 fL   MCH 31.7 26.0 - 34.0 pg   MCHC 33.7 30.0 - 36.0 g/dL   RDW 14.3 11.5 - 15.5 %   Platelets 73 (L) 150 - 400 K/uL    Comment: SPECIMEN CHECKED FOR CLOTS PLATELET COUNT CONFIRMED BY SMEAR   Glucose, capillary     Status: Abnormal   Collection Time: 06/14/15  7:15 AM  Result Value Ref Range   Glucose-Capillary 116 (H) 65 - 99 mg/dL   Comment 1 Notify RN   Glucose, capillary     Status: Abnormal   Collection Time: 06/14/15 11:16 AM  Result Value Ref Range   Glucose-Capillary 205 (H) 65 - 99 mg/dL   Comment 1 Notify RN   Glucose, capillary     Status: Abnormal   Collection Time: 06/14/15  4:21 PM  Result Value Ref Range   Glucose-Capillary 104 (H) 65 - 99 mg/dL   Comment 1 Notify RN   Glucose, capillary     Status: Abnormal   Collection Time: 06/14/15  8:39 PM  Result Value Ref Range   Glucose-Capillary 154 (H) 65 - 99 mg/dL    Comment 1 Notify RN    Comment 2 Document in Chart   Basic metabolic panel     Status: Abnormal  Collection Time: 06/15/15  6:21 AM  Result Value Ref Range   Sodium 134 (L) 135 - 145 mmol/L   Potassium 4.4 3.5 - 5.1 mmol/L   Chloride 102 101 - 111 mmol/L   CO2 27 22 - 32 mmol/L   Glucose, Bld 104 (H) 65 - 99 mg/dL   BUN 25 (H) 6 - 20 mg/dL   Creatinine, Ser 1.12 0.61 - 1.24 mg/dL   Calcium 7.8 (L) 8.9 - 10.3 mg/dL   GFR calc non Af Amer >60 >60 mL/min   GFR calc Af Amer >60 >60 mL/min    Comment: (NOTE) The eGFR has been calculated using the CKD EPI equation. This calculation has not been validated in all clinical situations. eGFR's persistently <60 mL/min signify possible Chronic Kidney Disease.    Anion gap 5 5 - 15  Vancomycin, trough     Status: Abnormal   Collection Time: 06/15/15  6:21 AM  Result Value Ref Range   Vancomycin Tr 22 (H) 10.0 - 20.0 ug/mL  Glucose, capillary     Status: Abnormal   Collection Time: 06/15/15  7:16 AM  Result Value Ref Range   Glucose-Capillary 107 (H) 65 - 99 mg/dL    No results found.  ROS: See chart Blood pressure 167/72, pulse 90, temperature 98.6 F (37 C), temperature source Oral, resp. rate 20, height $RemoveBe'5\' 6"'BqQuwAWdJ$  (1.676 m), weight 150.503 kg (331 lb 12.8 oz), SpO2 99 %. Physical Exam: Pleasant obese white male in no acute distress. Right lower extremity with evidence of stasis dermatitis and minimal swelling present. He has a papular rash in the right lower extremity. The calf muscle is soft. No difficulty with range of motion of the right foot is noted. Difficult to palpate pulses due to his body habitus. No significant open wounds are present. Right groin is mildly erythematous, but no open wounds are noted.  Assessment/Plan: Impression: Cellulitis of right lower extremity, unknown etiology. I do not find evidence for fasciitis. No need for incision and drainage at this time. Discussed with Dr. Donia Guiles. Agree with switching to  clindamycin and stopping vancomycin. Will consider in the future whether patient needs an Haematologist. We'll follow with you.  Travia Onstad A 06/15/2015, 10:41 AM

## 2015-06-15 NOTE — Progress Notes (Signed)
ANTIBIOTIC CONSULT NOTE - FOLLOW UP  Pharmacy Consult for Vancomycin Indication: cellulitis   Allergies  Allergen Reactions  . Penicillins Hives    Has patient had a PCN reaction causing immediate rash, facial/tongue/throat swelling, SOB or lightheadedness with hypotension: Yes Has patient had a PCN reaction causing severe rash involving mucus membranes or skin necrosis: Yes Has patient had a PCN reaction that required hospitalization No Has patient had a PCN reaction occurring within the last 10 years: No If all of the above answers are "NO", then may proceed with Cephalosporin use.    Patient Measurements: Height: 5\' 6"  (167.6 cm) Weight: (!) 331 lb 12.8 oz (150.503 kg) IBW/kg (Calculated) : 63.8  Vital Signs: Temp: 98.6 F (37 C) (11/04 0440) Temp Source: Oral (11/04 0440) BP: 167/72 mmHg (11/04 0440) Pulse Rate: 90 (11/04 0440) Intake/Output from previous day: 11/03 0701 - 11/04 0700 In: 1680 [P.O.:720; I.V.:110; IV Piggyback:850] Out: 2075 [Urine:2075] Intake/Output from this shift: Total I/O In: 240 [P.O.:240] Out: 800 [Urine:800]  Labs:  Recent Labs  06/14/15 0544 06/15/15 0621  WBC 3.9*  --   HGB 10.6*  --   PLT 73*  --   CREATININE 1.07 1.12   Estimated Creatinine Clearance: 106.3 mL/min (by C-G formula based on Cr of 1.12).  Recent Labs  06/15/15 0621  [facility] 22*     Microbiology: Recent Results (from the past 720 hour(s))  Blood culture (routine x 2)     Status: None (Preliminary result)   Collection Time: 06/11/15  3:28 PM  Result Value Ref Range Status   Specimen Description BLOOD LEFT HAND  Final   Special Requests BOTTLES DRAWN AEROBIC ONLY [place]  Final   Culture NO GROWTH 4 DAYS  Final   Report Status PENDING  Incomplete  Blood culture (routine x 2)     Status: None (Preliminary result)   Collection Time: 06/11/15  3:47 PM  Result Value Ref Range Status   Specimen Description BLOOD RIGHT ANTECUBITAL  Final   Special Requests  BOTTLES DRAWN AEROBIC AND ANAEROBIC [place]  Final   Culture NO GROWTH 4 DAYS  Final   Report Status PENDING  Incomplete  Urine culture     Status: None   Collection Time: 06/11/15  4:00 PM  Result Value Ref Range Status   Specimen Description URINE, CLEAN CATCH  Final   Special Requests NONE  Final   Culture   Final    MULTIPLE SPECIES PRESENT, SUGGEST RECOLLECTION Performed at [facility]    Report Status 06/13/2015 FINAL  Final  MRSA PCR Screening     Status: None   Collection Time: 06/11/15  7:43 PM  Result Value Ref Range Status   MRSA by PCR NEGATIVE NEGATIVE Final    Comment:        The GeneXpert MRSA Assay (FDA approved for NASAL specimens only), is one component of a comprehensive MRSA colonization surveillance program. It is not intended to diagnose MRSA infection nor to guide or monitor treatment for MRSA infections.     Anti-infectives    Start     Dose/Rate Route Frequency Ordered Stop   06/15/15 1700  vancomycin (VANCOCIN) 2,000 mg in sodium chloride 0.9 % 500 mL IVPB     2,000 mg 250 mL/hr over 120 Minutes Intravenous Every 24 hours 06/15/15 1311     06/15/15 1400  clindamycin (CLEOCIN) IVPB 900 mg     900 mg 100 mL/hr over 30 Minutes Intravenous 3 times per day 06/15/15  1230     06/14/15 1245  levofloxacin (LEVAQUIN) IVPB 750 mg     750 mg 100 mL/hr over 90 Minutes Intravenous Every 24 hours 06/14/15 1245     06/13/15 0800  fluconazole (DIFLUCAN) IVPB 200 mg     200 mg 100 mL/hr over 60 Minutes Intravenous Every 24 hours 06/12/15 0854     06/12/15 0900  fluconazole (DIFLUCAN) IVPB 400 mg     400 mg 100 mL/hr over 120 Minutes Intravenous  Once 06/12/15 0854 06/12/15 1148   06/12/15 0845  fluconazole (DIFLUCAN) IVPB 200 mg  Status:  Discontinued     200 mg 100 mL/hr over 60 Minutes Intravenous Every 24 hours 06/12/15 0842 06/12/15 0854   06/12/15 0400  vancomycin (VANCOCIN) 1,500 mg in sodium chloride 0.9 % 500 mL IVPB  Status:  Discontinued      1,500 mg 250 mL/hr over 120 Minutes Intravenous Every 12 hours 06/11/15 1547 06/15/15 1311   06/11/15 2300  aztreonam (AZACTAM) 2 g in dextrose 5 % 50 mL IVPB  Status:  Discontinued     2 g 100 mL/hr over 30 Minutes Intravenous 3 times per day 06/11/15 1530 06/12/15 1509   06/11/15 2300  metroNIDAZOLE (FLAGYL) IVPB 500 mg  Status:  Discontinued     500 mg 100 mL/hr over 60 Minutes Intravenous Every 8 hours 06/11/15 1530 06/12/15 0856   06/11/15 2200  rifaximin (XIFAXAN) tablet 550 mg     550 mg Oral 2 times daily 06/11/15 1942     06/11/15 1530  vancomycin (VANCOCIN) IVPB 1000 mg/200 mL premix  Status:  Discontinued     1,000 mg 200 mL/hr over 60 Minutes Intravenous  Once 06/11/15 1521 06/11/15 1526   06/11/15 1530  aztreonam (AZACTAM) 2 g in dextrose 5 % 50 mL IVPB     2 g 100 mL/hr over 30 Minutes Intravenous  Once 06/11/15 1522 06/11/15 1712   06/11/15 1530  metroNIDAZOLE (FLAGYL) IVPB 500 mg     500 mg 100 mL/hr over 60 Minutes Intravenous  Once 06/11/15 1522 06/11/15 1824   06/11/15 1530  vancomycin (VANCOCIN) 2,000 mg in sodium chloride 0.9 % 500 mL IVPB     2,000 mg 250 mL/hr over 120 Minutes Intravenous  Once 06/11/15 1526 06/11/15 1915     Assessment: Okay for Protocol, trough above goal.  Being treated for cellulitis, right lower extremity and right groin  Goal of Therapy:  Vancomycin trough level 10-15 mcg/ml  Plan:  Change Vancomycin to 2gm IV every 24 hours. Also on Levaquin, Clindamycin and Fluconazole Measure antibiotic drug levels at steady state Follow up culture results  Pricilla Larsson 06/15/2015,1:13 PM

## 2015-06-16 LAB — CBC
HEMATOCRIT: 36.2 % — AB (ref 39.0–52.0)
HEMOGLOBIN: 12.1 g/dL — AB (ref 13.0–17.0)
MCH: 32.2 pg (ref 26.0–34.0)
MCHC: 33.4 g/dL (ref 30.0–36.0)
MCV: 96.3 fL (ref 78.0–100.0)
Platelets: 83 10*3/uL — ABNORMAL LOW (ref 150–400)
RBC: 3.76 MIL/uL — ABNORMAL LOW (ref 4.22–5.81)
RDW: 14.1 % (ref 11.5–15.5)
WBC: 4.6 10*3/uL (ref 4.0–10.5)

## 2015-06-16 LAB — CULTURE, BLOOD (ROUTINE X 2)
Culture: NO GROWTH
Culture: NO GROWTH

## 2015-06-16 LAB — BASIC METABOLIC PANEL
ANION GAP: 5 (ref 5–15)
BUN: 28 mg/dL — AB (ref 6–20)
CHLORIDE: 101 mmol/L (ref 101–111)
CO2: 26 mmol/L (ref 22–32)
Calcium: 7.9 mg/dL — ABNORMAL LOW (ref 8.9–10.3)
Creatinine, Ser: 1.29 mg/dL — ABNORMAL HIGH (ref 0.61–1.24)
GFR calc Af Amer: >60 mL/min (ref >60)
GFR calc non Af Amer: >60 mL/min (ref >60)
Glucose, Bld: 158 mg/dL — ABNORMAL HIGH (ref 65–99)
POTASSIUM: 5.2 mmol/L — AB (ref 3.5–5.1)
SODIUM: 132 mmol/L — AB (ref 135–145)

## 2015-06-16 LAB — GLUCOSE, CAPILLARY
GLUCOSE-CAPILLARY: 159 mg/dL — AB (ref 65–99)
GLUCOSE-CAPILLARY: 168 mg/dL — AB (ref 65–99)
GLUCOSE-CAPILLARY: 122 mg/dL — AB (ref 65–99)
GLUCOSE-CAPILLARY: 138 mg/dL — AB (ref 65–99)

## 2015-06-16 MED ORDER — HYDROXYZINE HCL 25 MG PO TABS
25.0000 mg | ORAL_TABLET | Freq: Four times a day (QID) | ORAL | Status: DC | PRN
  Administered 2015-06-16 – 2015-06-26 (×10): 25 mg via ORAL
  Filled 2015-06-16 (×9): qty 1

## 2015-06-16 NOTE — Progress Notes (Addendum)
[facility] PROGRESS NOTE  [name] GGY:[id number] DOB: 1961-02-03 DOA: 06/11/2015 PCP: [name], MD  Assessment/Plan: 1. Cellulitis of the RLE and right groin. Surgery input appreciated, they do not recommend any debridement at this time. Clindamycin was added on 11/4. BC pending, with no growth to date. RLE [place] negative for DVT. Scrotal US negative for acute changes.  Case discussed with Dr. [name] on call for Infectious disease, and images of cellulitis were reviewed. It was recommended to continue the patient on vancomycin and discontinue other antibiotics. Since overall the patient appears to be clinically improving (no fevers, worsening pain, he is able to ambulate), it is recommended to continue current therapy and observe. 2. Constipation. Resolved. Patient had BM 11/5 3. Hep C cirrhosis, chronic. Follow up outpatient GI.  4. Hyperkalemia. Hold spironolactone 5. DM type 2, stable. Continue to hold oral antihyperglycemics and continue SSI. 6. GERD, continue PPI.  7. COPD. Continue bronchodilators. No evidence of wheezing 8. Chronic thrombocytopenia, stable. Likely related to cirrhosis. Will continue to monitor.  9. Obesity.  Code Status: Full DVT prophylaxis: SCDs Family Communication: Wife at bedside. Discussed with patient who understands and has no concerns at this time.11/5 Disposition Plan: Anticipate discharge within 1-2 days.   Consultants:  Surgery  Procedures:    Antibiotics:  Vancomycin 11/1>>   Fluconazole 11/1>>11/4  Aztreonam 10/31>>11/1  Flagyl 10/31>>11/1  Levofloxacin 11/3>> 11/5  Clindamycin 11/4>>11/5  HPI/Subjective: Pain in leg is throbbing.  Started feeling nauseated and dry heaved while having hot flash. Sometimes feel itchy.   Objective: Filed Vitals:   06/16/15 0600  BP: 163/92  Pulse:   Temp:   Resp:     Intake/Output Summary (Last 24 hours) at 06/16/15 0728 Last data filed at 06/16/15 0700  Gross per 24  hour  Intake    960 ml  Output   1750 ml  Net   -790 ml   Filed Weights   06/12/15 0500 06/14/15 0613 06/15/15 0436  Weight: 169.8 kg (374 lb 5.5 oz) 150.186 kg (331 lb 1.6 oz) 150.503 kg (331 lb 12.8 oz)    Exam:   General: Appears calm and comfortable, lying in bed.  Cardiovascular: RRR, no m/r/g.   Respiratory: CTA bilaterally, no w/r/r. Normal respiratory effort.  Abdomen: soft, ntnd  Musculoskeletal: Erythema in RLE appears to be unchanged from yesterday. Right groin appears to be improving.           Data Reviewed: Basic Metabolic Panel:  Recent Labs Lab 06/11/15 1546 06/12/15 0530 06/14/15 0544 06/15/15 0621  NA 134* 134* 133* 134*  K 4.2 4.6 4.3 4.4  CL 103 106 103 102  CO2 25 23 26 27   GLUCOSE 387* 210* 117* 104*  BUN 12 14 24* 25*  CREATININE 0.94 0.87 1.07 1.12  CALCIUM 8.1* 7.5* 7.6* 7.8*   Liver Function Tests:  Recent Labs Lab 06/11/15 1546 06/12/15 0530  AST 218* 218*  ALT 106* 102*  ALKPHOS 154* 144*  BILITOT 3.3* 3.8*  PROT 6.6 6.2*  ALBUMIN 2.5* 2.4*    Recent Labs Lab 06/11/15 1546  AMMONIA 39*   CBC:  Recent Labs Lab 06/11/15 1546 06/12/15 0530 06/14/15 0544  WBC 6.2 6.3 3.9*  NEUTROABS 4.4  --   --   HGB 12.2* 12.2* 10.6*  HCT 36.1* 35.4* 31.5*  MCV 95.3 95.4 94.3  PLT 78* 77* 73*   Cardiac Enzymes:  Recent Labs Lab 06/11/15 1546  TROPONINI <0.03   BNP (last 3 results)  Recent Labs  06/11/15 1546  BNP 89.0    ProBNP (last 3 results)  Recent Labs  07/31/14 1220  PROBNP 582.6*    CBG:  Recent Labs Lab 06/14/15 2039 06/15/15 0716 06/15/15 1125 06/15/15 1631 06/15/15 2238  GLUCAP 154* 107* 168* 128* 112*    Recent Results (from the past 240 hour(s))  Blood culture (routine x 2)     Status: None (Preliminary result)   Collection Time: 06/11/15  3:28 PM  Result Value Ref Range Status   Specimen Description BLOOD LEFT HAND  Final   Special Requests BOTTLES DRAWN AEROBIC ONLY [place]   Final   Culture NO GROWTH 4 DAYS  Final   Report Status PENDING  Incomplete  Blood culture (routine x 2)     Status: None (Preliminary result)   Collection Time: 06/11/15  3:47 PM  Result Value Ref Range Status   Specimen Description BLOOD RIGHT ANTECUBITAL  Final   Special Requests BOTTLES DRAWN AEROBIC AND ANAEROBIC [place]  Final   Culture NO GROWTH 4 DAYS  Final   Report Status PENDING  Incomplete  Urine culture     Status: None   Collection Time: 06/11/15  4:00 PM  Result Value Ref Range Status   Specimen Description URINE, CLEAN CATCH  Final   Special Requests NONE  Final   Culture   Final    MULTIPLE SPECIES PRESENT, SUGGEST RECOLLECTION Performed at [facility]    Report Status 06/13/2015 FINAL  Final  MRSA PCR Screening     Status: None   Collection Time: 06/11/15  7:43 PM  Result Value Ref Range Status   MRSA by PCR NEGATIVE NEGATIVE Final    Comment:        The GeneXpert MRSA Assay (FDA approved for NASAL specimens only), is one component of a comprehensive MRSA colonization surveillance program. It is not intended to diagnose MRSA infection nor to guide or monitor treatment for MRSA infections.      Studies: No results found.  Scheduled Meds: . clindamycin (CLEOCIN) IV  900 mg Intravenous 3 times per day  . fluconazole (DIFLUCAN) IV  200 mg Intravenous Q24H  . furosemide  80 mg Intravenous BID  . insulin aspart  0-20 Units Subcutaneous TID WC  . insulin aspart  0-5 Units Subcutaneous QHS  . insulin glargine  10 Units Subcutaneous Daily  . lactulose  10 g Oral TID  . levofloxacin (LEVAQUIN) IV  750 mg Intravenous Q24H  . milk and molasses  1 enema Rectal Once  . pantoprazole  40 mg Oral Daily  . rifaximin  550 mg Oral BID  . sodium chloride  3 mL Intravenous Q12H  . spironolactone  25 mg Oral Daily  . vancomycin  2,000 mg Intravenous Q24H   Continuous Infusions:   Active Problems:   Hepatic cirrhosis (HCC)   Diabetes ([place])   Chronic  hepatitis C with cirrhosis (HCC)   Cellulitis   Cellulitis of right leg   Thrombocytopenia ([place])    Time spent: 30 minutes   Jehanzeb Memon. MD [facility] Pager [phone]. If 7PM-7AM, please contact night-coverage at [email or web address], password [facility] 06/16/2015, 7:28 AM  LOS: 5 days     By signing my name below, I, Rosalie Doctor, attest that this documentation has been prepared under the direction and in the presence of [facility]. MD Electronically Signed: Rosalie Doctor, Scribe. 06/16/2015 2:10 PM   I, Dr. Kathie Dike, personally performed the services described in this documentaiton. All  medical record entries made by the scribe were at my direction and in my presence. I have reviewed the chart and agree that the record reflects my personal performance and is accurate and complete  Kathie Dike, MD, 06/16/2015 4:14 PM

## 2015-06-16 NOTE — Progress Notes (Signed)
Subjective: States that the right lower extremity pain and swelling seem to be less.  Objective: Vital signs in last 24 hours: Temp:  [97.5 F (36.4 C)-98.9 F (37.2 C)] 97.8 F (36.6 C) (11/05 0800) Pulse Rate:  [65-90] 65 (11/05 0800) Resp:  [20] 20 (11/05 0800) BP: (120-163)/(54-92) 139/86 mmHg (11/05 0800) SpO2:  [95 %-100 %] 95 % (11/05 0800) Last BM Date: 07/11/15  Intake/Output from previous day: 11/04 0701 - 11/05 0700 In: 960 [P.O.:960] Out: 1750 [Urine:1750] Intake/Output this shift: Total I/O In: 240 [P.O.:240] Out: -   Extremities: No blistering noted in the right lower pretibial region. Macular rash seems to be stable. Still some pitting edema present. Is able to wiggle toes.  Lab Results:   Recent Labs  06/14/15 0544 06/16/15 0628  WBC 3.9* 4.6  HGB 10.6* 12.1*  HCT 31.5* 36.2*  PLT 73* 83*   BMET  Recent Labs  06/15/15 0621 06/16/15 0628  NA 134* 132*  K 4.4 5.2*  CL 102 101  CO2 27 26  GLUCOSE 104* 158*  BUN 25* 28*  CREATININE 1.12 1.29*  CALCIUM 7.8* 7.9*   PT/INR No results for input(s): LABPROT, INR in the last 72 hours.  Studies/Results: No results found.  Anti-infectives: Anti-infectives    Start     Dose/Rate Route Frequency Ordered Stop   06/15/15 1700  vancomycin (VANCOCIN) 2,000 mg in sodium chloride 0.9 % 500 mL IVPB     2,000 mg 250 mL/hr over 120 Minutes Intravenous Every 24 hours 06/15/15 1311     06/15/15 1400  clindamycin (CLEOCIN) IVPB 900 mg     900 mg 100 mL/hr over 30 Minutes Intravenous 3 times per day 06/15/15 1230     06/14/15 1245  levofloxacin (LEVAQUIN) IVPB 750 mg     750 mg 100 mL/hr over 90 Minutes Intravenous Every 24 hours 06/14/15 1245     06/13/15 0800  fluconazole (DIFLUCAN) IVPB 200 mg  Status:  Discontinued     200 mg 100 mL/hr over 60 Minutes Intravenous Every 24 hours 06/12/15 0854 06/16/15 0814   06/12/15 0900  fluconazole (DIFLUCAN) IVPB 400 mg     400 mg 100 mL/hr over 120 Minutes  Intravenous  Once 06/12/15 0854 06/12/15 1148   06/12/15 0845  fluconazole (DIFLUCAN) IVPB 200 mg  Status:  Discontinued     200 mg 100 mL/hr over 60 Minutes Intravenous Every 24 hours 06/12/15 0842 06/12/15 0854   06/12/15 0400  vancomycin (VANCOCIN) 1,500 mg in sodium chloride 0.9 % 500 mL IVPB  Status:  Discontinued     1,500 mg 250 mL/hr over 120 Minutes Intravenous Every 12 hours 06/11/15 1547 06/15/15 1311   06/11/15 2300  aztreonam (AZACTAM) 2 g in dextrose 5 % 50 mL IVPB  Status:  Discontinued     2 g 100 mL/hr over 30 Minutes Intravenous 3 times per day 06/11/15 1530 06/12/15 1509   06/11/15 2300  metroNIDAZOLE (FLAGYL) IVPB 500 mg  Status:  Discontinued     500 mg 100 mL/hr over 60 Minutes Intravenous Every 8 hours 06/11/15 1530 06/12/15 0856   06/11/15 2200  rifaximin (XIFAXAN) tablet 550 mg     550 mg Oral 2 times daily 06/11/15 1942     06/11/15 1530  vancomycin (VANCOCIN) IVPB 1000 mg/200 mL premix  Status:  Discontinued     1,000 mg 200 mL/hr over 60 Minutes Intravenous  Once 06/11/15 1521 06/11/15 1526   06/11/15 1530  aztreonam (AZACTAM) 2 g in  dextrose 5 % 50 mL IVPB     2 g 100 mL/hr over 30 Minutes Intravenous  Once 06/11/15 1522 06/11/15 1712   06/11/15 1530  metroNIDAZOLE (FLAGYL) IVPB 500 mg     500 mg 100 mL/hr over 60 Minutes Intravenous  Once 06/11/15 1522 06/11/15 1824   06/11/15 1530  vancomycin (VANCOCIN) 2,000 mg in sodium chloride 0.9 % 500 mL IVPB     2,000 mg 250 mL/hr over 120 Minutes Intravenous  Once 06/11/15 1526 06/11/15 1915      Assessment/Plan: Impression: Cellulitis with component of lymphangitis, right lower extremity. Liver cirrhosis. Plan: No change in management from surgical standpoint.  LOS: 5 days    [name] 06/16/2015

## 2015-06-17 LAB — BASIC METABOLIC PANEL
ANION GAP: 5 (ref 5–15)
BUN: 33 mg/dL — ABNORMAL HIGH (ref 6–20)
CHLORIDE: 101 mmol/L (ref 101–111)
CO2: 25 mmol/L (ref 22–32)
Calcium: 8.1 mg/dL — ABNORMAL LOW (ref 8.9–10.3)
Creatinine, Ser: 1.26 mg/dL — ABNORMAL HIGH (ref 0.61–1.24)
GFR calc non Af Amer: >60 mL/min (ref >60)
Glucose, Bld: 154 mg/dL — ABNORMAL HIGH (ref 65–99)
POTASSIUM: 4.7 mmol/L (ref 3.5–5.1)
Sodium: 131 mmol/L — ABNORMAL LOW (ref 135–145)

## 2015-06-17 LAB — GLUCOSE, CAPILLARY
GLUCOSE-CAPILLARY: 164 mg/dL — AB (ref 65–99)
GLUCOSE-CAPILLARY: 161 mg/dL — AB (ref 65–99)
GLUCOSE-CAPILLARY: 135 mg/dL — AB (ref 65–99)
Glucose-Capillary: 152 mg/dL — ABNORMAL HIGH (ref 65–99)

## 2015-06-17 NOTE — Progress Notes (Signed)
[facility] PROGRESS NOTE  [name] CLE:[id number] DOB: 03-Jun-1961 DOA: 06/11/2015 PCP: [name], MD  Assessment/Plan: 1. Cellulitis of the RLE and right groin. Surgery input appreciated, they do not recommend any debridement at this time. BC pending, with no growth to date. RLE [place] negative for DVT. Scrotal US negative for acute changes.  Case discussed with Dr. [name] on call for Infectious disease 11/5, and images of cellulitis were reviewed. It was recommended to continue the patient on vancomycin and discontinue other antibiotics. Since overall the patient appears to be clinically improving , will continue Vancomycin and encourage ambulation. His progress has been slow. 2. Constipation. Resolved. Patient had BM 11/5 3. Hep C cirrhosis, chronic. Follow up outpatient GI.  4. Hyperkalemia, resolved.  5. Hyponatremia. Will replete 6. DM type 2, stable. Continue to hold oral antihyperglycemics and continue SSI. 7. GERD, continue PPI.  8. COPD. Continue bronchodilators. No evidence of wheezing 9. Chronic thrombocytopenia, stable. Likely related to cirrhosis. Will continue to monitor.  10. Obesity.  Code Status: Full DVT prophylaxis: SCDs Family Communication: Wife at bedside. Discussed with patient who understands and has no concerns at this time. Disposition Plan: Anticipate discharge within 1-2 days.   Consultants:  Surgery  Procedures:    Antibiotics:  Vancomycin 11/1>>   Fluconazole 11/1>>11/4  Aztreonam 10/31>>11/1  Flagyl 10/31>>11/1  Levofloxacin 11/3>> 11/5  Clindamycin 11/4>>11/5  HPI/Subjective: Feels better. States his leg is somewhat improved but still has pain on his RLE and feels a pressure when he ambulates.    Objective: Filed Vitals:   06/17/15 0547  BP: 153/65  Pulse: 72  Temp: 97.6 F (36.4 C)  Resp: 18    Intake/Output Summary (Last 24 hours) at 06/17/15 0731 Last data filed at 06/16/15 1900  Gross per 24 hour   Intake   1220 ml  Output    900 ml  Net    320 ml   Filed Weights   06/12/15 0500 06/14/15 0613 06/15/15 0436  Weight: 169.8 kg (374 lb 5.5 oz) 150.186 kg (331 lb 1.6 oz) 150.503 kg (331 lb 12.8 oz)    Exam:  General: NAD, looks comfortable Cardiovascular: RRR, S1, S2  Respiratory: clear bilaterally, No wheezing, rales or rhonchi Abdomen: soft, non tender, no distention , bowel sounds normal Musculoskeletal: Erythema over RLE appears mildly improved.    Data Reviewed: Basic Metabolic Panel:  Recent Labs Lab 06/11/15 1546 06/12/15 0530 06/14/15 0544 06/15/15 0621 06/16/15 0628  NA 134* 134* 133* 134* 132*  K 4.2 4.6 4.3 4.4 5.2*  CL 103 106 103 102 101  CO2 25 23 26 27 26   GLUCOSE 387* 210* 117* 104* 158*  BUN 12 14 24* 25* 28*  CREATININE 0.94 0.87 1.07 1.12 1.29*  CALCIUM 8.1* 7.5* 7.6* 7.8* 7.9*   Liver Function Tests:  Recent Labs Lab 06/11/15 1546 06/12/15 0530  AST 218* 218*  ALT 106* 102*  ALKPHOS 154* 144*  BILITOT 3.3* 3.8*  PROT 6.6 6.2*  ALBUMIN 2.5* 2.4*    Recent Labs Lab 06/11/15 1546  AMMONIA 39*   CBC:  Recent Labs Lab 06/11/15 1546 06/12/15 0530 06/14/15 0544 06/16/15 0628  WBC 6.2 6.3 3.9* 4.6  NEUTROABS 4.4  --   --   --   HGB 12.2* 12.2* 10.6* 12.1*  HCT 36.1* 35.4* 31.5* 36.2*  MCV 95.3 95.4 94.3 96.3  PLT 78* 77* 73* 83*   Cardiac Enzymes:  Recent Labs Lab 06/11/15 1546  TROPONINI <0.03   BNP (  last 3 results)  Recent Labs  06/11/15 1546  BNP 89.0    ProBNP (last 3 results)  Recent Labs  07/31/14 1220  PROBNP 582.6*    CBG:  Recent Labs Lab 06/15/15 2238 06/16/15 0731 06/16/15 1120 06/16/15 1651 06/16/15 2008  GLUCAP 112* 159* 168* 122* 138*    Recent Results (from the past 240 hour(s))  Blood culture (routine x 2)     Status: None   Collection Time: 06/11/15  3:28 PM  Result Value Ref Range Status   Specimen Description BLOOD LEFT HAND  Final   Special Requests BOTTLES DRAWN AEROBIC  ONLY [place]  Final   Culture NO GROWTH 5 DAYS  Final   Report Status 06/16/2015 FINAL  Final  Blood culture (routine x 2)     Status: None   Collection Time: 06/11/15  3:47 PM  Result Value Ref Range Status   Specimen Description BLOOD RIGHT ANTECUBITAL  Final   Special Requests BOTTLES DRAWN AEROBIC AND ANAEROBIC [place]  Final   Culture NO GROWTH 5 DAYS  Final   Report Status 06/16/2015 FINAL  Final  Urine culture     Status: None   Collection Time: 06/11/15  4:00 PM  Result Value Ref Range Status   Specimen Description URINE, CLEAN CATCH  Final   Special Requests NONE  Final   Culture   Final    MULTIPLE SPECIES PRESENT, SUGGEST RECOLLECTION Performed at [facility]    Report Status 06/13/2015 FINAL  Final  MRSA PCR Screening     Status: None   Collection Time: 06/11/15  7:43 PM  Result Value Ref Range Status   MRSA by PCR NEGATIVE NEGATIVE Final    Comment:        The GeneXpert MRSA Assay (FDA approved for NASAL specimens only), is one component of a comprehensive MRSA colonization surveillance program. It is not intended to diagnose MRSA infection nor to guide or monitor treatment for MRSA infections.      Studies: No results found.  Scheduled Meds: . insulin aspart  0-20 Units Subcutaneous TID WC  . insulin aspart  0-5 Units Subcutaneous QHS  . insulin glargine  10 Units Subcutaneous Daily  . lactulose  10 g Oral TID  . milk and molasses  1 enema Rectal Once  . pantoprazole  40 mg Oral Daily  . rifaximin  550 mg Oral BID  . sodium chloride  3 mL Intravenous Q12H  . vancomycin  2,000 mg Intravenous Q24H   Continuous Infusions:   Active Problems:   Hepatic cirrhosis (HCC)   Diabetes ([place])   Chronic hepatitis C with cirrhosis (HCC)   Cellulitis   Cellulitis of right leg   Thrombocytopenia ([place])    Time spent: 20 minutes   Jehanzeb Memon. MD [facility] Pager [phone]. If 7PM-7AM, please contact night-coverage at [email or web address],  password [facility] 06/17/2015, 7:31 AM  LOS: 6 days     By signing my name below, I, Rosalie Doctor, attest that this documentation has been prepared under the direction and in the presence of [facility]. MD Electronically Signed: Rosalie Doctor, Scribe. 06/17/2015 9:55am  I, Dr. Kathie Dike, personally performed the services described in this documentaiton. All medical record entries made by the scribe were at my direction and in my presence. I have reviewed the chart and agree that the record reflects my personal performance and is accurate and complete  Kathie Dike, MD, 06/17/2015 10:08 AM

## 2015-06-18 DIAGNOSIS — E875 Hyperkalemia: Secondary | ICD-10-CM

## 2015-06-18 LAB — BASIC METABOLIC PANEL
Anion gap: 5 (ref 5–15)
BUN: 34 mg/dL — AB (ref 6–20)
CALCIUM: 8.2 mg/dL — AB (ref 8.9–10.3)
CO2: 26 mmol/L (ref 22–32)
Chloride: 101 mmol/L (ref 101–111)
Creatinine, Ser: 1.46 mg/dL — ABNORMAL HIGH (ref 0.61–1.24)
GFR calc non Af Amer: 53 mL/min — ABNORMAL LOW (ref >60)
Glucose, Bld: 145 mg/dL — ABNORMAL HIGH (ref 65–99)
POTASSIUM: 5.5 mmol/L — AB (ref 3.5–5.1)
Sodium: 132 mmol/L — ABNORMAL LOW (ref 135–145)

## 2015-06-18 LAB — CBC
HEMATOCRIT: 30.9 % — AB (ref 39.0–52.0)
HEMOGLOBIN: 10.5 g/dL — AB (ref 13.0–17.0)
MCH: 32.7 pg (ref 26.0–34.0)
MCHC: 34 g/dL (ref 30.0–36.0)
MCV: 96.3 fL (ref 78.0–100.0)
Platelets: 66 10*3/uL — ABNORMAL LOW (ref 150–400)
RBC: 3.21 MIL/uL — ABNORMAL LOW (ref 4.22–5.81)
RDW: 13.7 % (ref 11.5–15.5)
WBC: 4.8 10*3/uL (ref 4.0–10.5)

## 2015-06-18 LAB — GLUCOSE, CAPILLARY
GLUCOSE-CAPILLARY: 144 mg/dL — AB (ref 65–99)
GLUCOSE-CAPILLARY: 157 mg/dL — AB (ref 65–99)
GLUCOSE-CAPILLARY: 170 mg/dL — AB (ref 65–99)
Glucose-Capillary: 141 mg/dL — ABNORMAL HIGH (ref 65–99)

## 2015-06-18 LAB — VANCOMYCIN, TROUGH: Vancomycin Tr: 17 ug/mL (ref 10.0–20.0)

## 2015-06-18 MED ORDER — VANCOMYCIN HCL 10 G IV SOLR
1750.0000 mg | INTRAVENOUS | Status: DC
  Administered 2015-06-19 – 2015-06-20 (×2): 1750 mg via INTRAVENOUS
  Filled 2015-06-18 (×3): qty 1750

## 2015-06-18 MED ORDER — SODIUM POLYSTYRENE SULFONATE 15 GM/60ML PO SUSP
30.0000 g | Freq: Once | ORAL | Status: AC
  Administered 2015-06-18: 30 g via ORAL
  Filled 2015-06-18: qty 120

## 2015-06-18 NOTE — Care Management Note (Signed)
Case Management Note  Patient Details  Name: [name] MRN: [id number] Date of Birth: [birth year]  Subjective/Objective:                  Pt cont to progress slowly.   Action/Plan: Will cont to follow for DC planning  Expected Discharge Date:     06/20/2015             Expected Discharge Plan:  Home/Self Care  In-House Referral:  NA  Discharge planning Services  CM Consult  Post Acute Care Choice:  NA Choice offered to:  NA  DME Arranged:  [name] DME Agency:     HH Arranged:    HH Agency:     Status of Service:  Cont to follow.   Medicare Important Message Given:    Date Medicare IM Given:    Medicare IM give by:    Date Additional Medicare IM Given:    Additional Medicare Important Message give by:     If discussed at [place] of Stay Meetings, dates discussed:    Additional Comments:  Sherald Barge, RN 06/18/2015, 2:11 PM

## 2015-06-18 NOTE — Consult Note (Addendum)
WOC wound consult note Reason for Consult: Consult requested for right leg, performed via remote camera with bedside nurse assistance.  Pt has cellulitis and surgical team has been following for assessment and plan of care.  They recommend follow-up with the outpatient wound care center after discharge; Primary team: please have case manager arrange if you agree.  This must be by physician referral. Pt could also benefit from home health assistance after discharge. Wound type: RLE with generalized cellulitis and blistering.  Measurement: Affected area extends from right foot to below right knee. Wound bed: Generalized erythremia and edema.   Drainage (amount, consistency, odor) Pt states it was previously weeping large amt yellow drainage, but since he has been on IV antibiotics, it has decreased to a mod amt.  No odor, currently no open wounds. Dressing procedure/placement/frequency: Expect areas of blistering to evolve and rupture and peel. Xeroform gauze to promote moist healing and gauze and ABD pads to absorb drainage.  Ace wrap for light compression.  Discussed plan of care with patient and bedside nurse, they verbalize understanding. Please re-consult if further assistance is needed.  Thank-you,  Julien Girt MSN, [place], [place], [place], [place]

## 2015-06-18 NOTE — Progress Notes (Signed)
ANTIBIOTIC CONSULT NOTE - FOLLOW UP  Pharmacy Consult for Vancomycin Indication: cellulitis   Allergies  Allergen Reactions  . Penicillins Hives    Has patient had a PCN reaction causing immediate rash, facial/tongue/throat swelling, SOB or lightheadedness with hypotension: Yes Has patient had a PCN reaction causing severe rash involving mucus membranes or skin necrosis: Yes Has patient had a PCN reaction that required hospitalization No Has patient had a PCN reaction occurring within the last 10 years: No If all of the above answers are "NO", then may proceed with Cephalosporin use.    Patient Measurements: Height: 5\' 6"  (167.6 cm) Weight: (!) 331 lb 12.8 oz (150.503 kg) IBW/kg (Calculated) : 63.8  Vital Signs: Temp: 98.1 F (36.7 C) (11/07 1524) Temp Source: Oral (11/07 1524) BP: 144/54 mmHg (11/07 1524) Pulse Rate: 88 (11/07 1524) Intake/Output from previous day: 11/06 0701 - 11/07 0700 In: 720 [P.O.:720] Out: 1200 [Urine:1200] Intake/Output from this shift: Total I/O In: 843 [P.O.:840; I.V.:3] Out: -   Labs:  Recent Labs  06/16/15 0628 06/17/15 0730 06/18/15 0442  WBC 4.6  --  4.8  HGB 12.1*  --  10.5*  PLT 83*  --  66*  CREATININE 1.29* 1.26* 1.46*   Estimated Creatinine Clearance: 81.5 mL/min (by C-G formula based on Cr of 1.46).  Recent Labs  06/18/15 1627  [place]     Microbiology: Recent Results (from the past 720 hour(s))  Blood culture (routine x 2)     Status: None   Collection Time: 06/11/15  3:28 PM  Result Value Ref Range Status   Specimen Description BLOOD LEFT HAND  Final   Special Requests BOTTLES DRAWN AEROBIC ONLY [place]  Final   Culture NO GROWTH 5 DAYS  Final   Report Status 06/16/2015 FINAL  Final  Blood culture (routine x 2)     Status: None   Collection Time: 06/11/15  3:47 PM  Result Value Ref Range Status   Specimen Description BLOOD RIGHT ANTECUBITAL  Final   Special Requests BOTTLES DRAWN AEROBIC AND ANAEROBIC [place]  Final   Culture NO GROWTH 5 DAYS  Final   Report Status 06/16/2015 FINAL  Final  Urine culture     Status: None   Collection Time: 06/11/15  4:00 PM  Result Value Ref Range Status   Specimen Description URINE, CLEAN CATCH  Final   Special Requests NONE  Final   Culture   Final    MULTIPLE SPECIES PRESENT, SUGGEST RECOLLECTION Performed at [facility]    Report Status 06/13/2015 FINAL  Final  MRSA PCR Screening     Status: None   Collection Time: 06/11/15  7:43 PM  Result Value Ref Range Status   MRSA by PCR NEGATIVE NEGATIVE Final    Comment:        The GeneXpert MRSA Assay (FDA approved for NASAL specimens only), is one component of a comprehensive MRSA colonization surveillance program. It is not intended to diagnose MRSA infection nor to guide or monitor treatment for MRSA infections.     Anti-infectives    Start     Dose/Rate Route Frequency Ordered Stop   06/15/15 1700  vancomycin (VANCOCIN) 2,000 mg in sodium chloride 0.9 % 500 mL IVPB     2,000 mg 250 mL/hr over 120 Minutes Intravenous Every 24 hours 06/15/15 1311     06/15/15 1400  clindamycin (CLEOCIN) IVPB 900 mg  Status:  Discontinued     900 mg 100 mL/hr over 30 Minutes  Intravenous 3 times per day 06/15/15 1230 06/16/15 1630   06/14/15 1245  levofloxacin (LEVAQUIN) IVPB 750 mg  Status:  Discontinued     750 mg 100 mL/hr over 90 Minutes Intravenous Every 24 hours 06/14/15 1245 06/16/15 1630   06/13/15 0800  fluconazole (DIFLUCAN) IVPB 200 mg  Status:  Discontinued     200 mg 100 mL/hr over 60 Minutes Intravenous Every 24 hours 06/12/15 0854 06/16/15 0814   06/12/15 0900  fluconazole (DIFLUCAN) IVPB 400 mg     400 mg 100 mL/hr over 120 Minutes Intravenous  Once 06/12/15 0854 06/12/15 1148   06/12/15 0845  fluconazole (DIFLUCAN) IVPB 200 mg  Status:  Discontinued     200 mg 100 mL/hr over 60 Minutes Intravenous Every 24 hours 06/12/15 0842 06/12/15 0854   06/12/15 0400  vancomycin (VANCOCIN)  1,500 mg in sodium chloride 0.9 % 500 mL IVPB  Status:  Discontinued     1,500 mg 250 mL/hr over 120 Minutes Intravenous Every 12 hours 06/11/15 1547 06/15/15 1311   06/11/15 2300  aztreonam (AZACTAM) 2 g in dextrose 5 % 50 mL IVPB  Status:  Discontinued     2 g 100 mL/hr over 30 Minutes Intravenous 3 times per day 06/11/15 1530 06/12/15 1509   06/11/15 2300  metroNIDAZOLE (FLAGYL) IVPB 500 mg  Status:  Discontinued     500 mg 100 mL/hr over 60 Minutes Intravenous Every 8 hours 06/11/15 1530 06/12/15 0856   06/11/15 2200  rifaximin (XIFAXAN) tablet 550 mg     550 mg Oral 2 times daily 06/11/15 1942     06/11/15 1530  vancomycin (VANCOCIN) IVPB 1000 mg/200 mL premix  Status:  Discontinued     1,000 mg 200 mL/hr over 60 Minutes Intravenous  Once 06/11/15 1521 06/11/15 1526   06/11/15 1530  aztreonam (AZACTAM) 2 g in dextrose 5 % 50 mL IVPB     2 g 100 mL/hr over 30 Minutes Intravenous  Once 06/11/15 1522 06/11/15 1712   06/11/15 1530  metroNIDAZOLE (FLAGYL) IVPB 500 mg     500 mg 100 mL/hr over 60 Minutes Intravenous  Once 06/11/15 1522 06/11/15 1824   06/11/15 1530  vancomycin (VANCOCIN) 2,000 mg in sodium chloride 0.9 % 500 mL IVPB     2,000 mg 250 mL/hr over 120 Minutes Intravenous  Once 06/11/15 1526 06/11/15 1915     Assessment: 54 yo man on vancomycin for cellulitis.  Vanc trough today is 17 mg/L.  SrCr has increased to 1.46.  Goal of Therapy:  Vancomycin trough level 10-15 mcg/ml  Plan:  Change Vancomycin to 1750 gm IV every 24 hours. F/u renal function and clinical course  Mycah Formica [place] 06/18/2015,5:41 PM

## 2015-06-18 NOTE — Progress Notes (Signed)
[facility] PROGRESS NOTE  [name] DOB: 24-Jun-1961 DOA: 06/11/2015 PCP: [name], MD  Assessment/Plan: 1. Cellulitis of the RLE and right groin.  Blood cultures unremarkable. RLE [place] negative for DVT. Scrotal US negative for acute changes. Case discussed with Dr. [name] on call for Infectious disease 11/5, and images of cellulitis were reviewed. Will continue Vancomycin as recommended. Although his progress is slow, cellulitis is showing improvement. Will continue to encourage ambulation. Wound care consultation. 2. Constipation. Will restart bowel regimen.  3. Hep C cirrhosis, chronic. Follow up outpatient GI.  4. Hyperkalemia, will give a dose of kayexalate. 5. Hyponatremia. Continue to replete. 6. DM type 2, stable. Continue to hold oral hypoglycemics and continue SSI. 7. GERD, continue PPI.  8. COPD.  Will continue bronchodilators.  9. Chronic thrombocytopenia, stable. Likely related to cirrhosis. Will continue to monitor.  10. Obesity.  Code Status: Full DVT prophylaxis: SCDs Family Communication: Wife at bedside. Discussed with patient who understands and has no concerns at this time. Disposition Plan: Anticipate discharge within 1-2 days.    Consultants:  Surgery  Procedures:    Antibiotics:  Vancomycin 11/1>>   Fluconazole 11/1>>11/4  Aztreonam 10/31>>11/1  Flagyl 10/31>>11/1  Levofloxacin 11/3>> 11/5  Clindamycin 11/4>>11/5  HPI/Subjective:  Doing fine, able to sleep without difficulty. Leg is improving, but has noticed drainage and still has burning sensations that are improved with pain medication. Although he has been ambulating and urinating regularly he has not had a bowel movement.  Reports dyspnea when ambulating.   Objective: Filed Vitals:   06/18/15 0636  BP: 161/65  Pulse: 85  Temp: 98 F (36.7 C)  Resp: 18    Intake/Output Summary (Last 24 hours) at 06/18/15 0701 Last data filed at 06/17/15 1900   Gross per 24 hour  Intake    720 ml  Output   1200 ml  Net   -480 ml   Filed Weights   06/12/15 0500 06/14/15 0613 06/15/15 0436  Weight: 169.8 kg (374 lb 5.5 oz) 150.186 kg (331 lb 1.6 oz) 150.503 kg (331 lb 12.8 oz)    Exam:  General: NAD. Appears comfortable and comfortable.  Cardiovascular: Regular arte and rhythm  Respiratory: clear bilaterally, No wheezing, rales or rhonchi Abdomen: soft, non tender, no distention , bowel sounds normal Musculoskeletal: Continued erythema in right leg and groin with slow improvement.    Data Reviewed: Basic Metabolic Panel:  Recent Labs Lab 06/14/15 0544 06/15/15 0621 06/16/15 0628 06/17/15 0730 06/18/15 0442  NA 133* 134* 132* 131* 132*  K 4.3 4.4 5.2* 4.7 5.5*  CL 103 102 101 101 101  CO2 26 27 26 25 26   GLUCOSE 117* 104* 158* 154* 145*  BUN 24* 25* 28* 33* 34*  CREATININE 1.07 1.12 1.29* 1.26* 1.46*  CALCIUM 7.6* 7.8* 7.9* 8.1* 8.2*   Liver Function Tests:  Recent Labs Lab 06/11/15 1546 06/12/15 0530  AST 218* 218*  ALT 106* 102*  ALKPHOS 154* 144*  BILITOT 3.3* 3.8*  PROT 6.6 6.2*  ALBUMIN 2.5* 2.4*    Recent Labs Lab 06/11/15 1546  AMMONIA 39*   CBC:  Recent Labs Lab 06/11/15 1546 06/12/15 0530 06/14/15 0544 06/16/15 0628 06/18/15 0442  WBC 6.2 6.3 3.9* 4.6 4.8  NEUTROABS 4.4  --   --   --   --   HGB 12.2* 12.2* 10.6* 12.1* 10.5*  HCT 36.1* 35.4* 31.5* 36.2* 30.9*  MCV 95.3 95.4 94.3 96.3 96.3  PLT 78* 77* 73* 83*  66*   Cardiac Enzymes:  Recent Labs Lab 06/11/15 1546  TROPONINI <0.03   BNP (last 3 results)  Recent Labs  06/11/15 1546  BNP 89.0    ProBNP (last 3 results)  Recent Labs  07/31/14 1220  PROBNP 582.6*    CBG:  Recent Labs Lab 06/16/15 2008 06/17/15 0751 06/17/15 1214 06/17/15 1644 06/17/15 2257  GLUCAP 138* 152* 164* 161* 135*    Recent Results (from the past 240 hour(s))  Blood culture (routine x 2)     Status: None   Collection Time: 06/11/15  3:28 PM   Result Value Ref Range Status   Specimen Description BLOOD LEFT HAND  Final   Special Requests BOTTLES DRAWN AEROBIC ONLY [place]  Final   Culture NO GROWTH 5 DAYS  Final   Report Status 06/16/2015 FINAL  Final  Blood culture (routine x 2)     Status: None   Collection Time: 06/11/15  3:47 PM  Result Value Ref Range Status   Specimen Description BLOOD RIGHT ANTECUBITAL  Final   Special Requests BOTTLES DRAWN AEROBIC AND ANAEROBIC [place]  Final   Culture NO GROWTH 5 DAYS  Final   Report Status 06/16/2015 FINAL  Final  Urine culture     Status: None   Collection Time: 06/11/15  4:00 PM  Result Value Ref Range Status   Specimen Description URINE, CLEAN CATCH  Final   Special Requests NONE  Final   Culture   Final    MULTIPLE SPECIES PRESENT, SUGGEST RECOLLECTION Performed at [facility]    Report Status 06/13/2015 FINAL  Final  MRSA PCR Screening     Status: None   Collection Time: 06/11/15  7:43 PM  Result Value Ref Range Status   MRSA by PCR NEGATIVE NEGATIVE Final    Comment:        The GeneXpert MRSA Assay (FDA approved for NASAL specimens only), is one component of a comprehensive MRSA colonization surveillance program. It is not intended to diagnose MRSA infection nor to guide or monitor treatment for MRSA infections.      Studies: No results found.  Scheduled Meds: . insulin aspart  0-20 Units Subcutaneous TID WC  . insulin aspart  0-5 Units Subcutaneous QHS  . insulin glargine  10 Units Subcutaneous Daily  . lactulose  10 g Oral TID  . milk and molasses  1 enema Rectal Once  . pantoprazole  40 mg Oral Daily  . rifaximin  550 mg Oral BID  . sodium chloride  3 mL Intravenous Q12H  . vancomycin  2,000 mg Intravenous Q24H   Continuous Infusions:   Active Problems:   Hepatic cirrhosis (HCC)   Diabetes ([place])   Chronic hepatitis C with cirrhosis (HCC)   Cellulitis   Cellulitis of right leg   Thrombocytopenia ([place])    Time spent: 20  minutes   Jehanzeb Memon. MD [facility] Pager [phone]. If 7PM-7AM, please contact night-coverage at [email or web address], password [facility] 06/18/2015, 7:01 AM  LOS: 7 days      By signing my name below, I, Rennis Harding, attest that this documentation has been prepared under the direction and in the presence of Kathie Dike, MD. Electronically signed: Rennis Harding, Scribe. 06/18/2015 9:35am.  I, Dr. Kathie Dike, personally performed the services described in this documentaiton. All medical record entries made by the scribe were at my direction and in my presence. I have reviewed the chart and agree that the record reflects my personal performance and is  accurate and complete  Kathie Dike, MD, 06/18/2015 9:56 AM

## 2015-06-18 NOTE — Progress Notes (Signed)
Patient states that he has some pressure sensation in his right lower extremity when ambulating, though he has no pain at rest. Clinically, the right lower extremity looks slightly improved without evidence of subcutaneous necrosis. I reviewed Dr. Blythe Stanford note about ID input. I think the patient would benefit from outpatient wound care center evaluation. Will follow peripherally. Please call me if I can be of further assistance.

## 2015-06-19 LAB — BASIC METABOLIC PANEL
Anion gap: 4 — ABNORMAL LOW (ref 5–15)
BUN: 31 mg/dL — AB (ref 6–20)
CHLORIDE: 102 mmol/L (ref 101–111)
CO2: 28 mmol/L (ref 22–32)
Calcium: 8.1 mg/dL — ABNORMAL LOW (ref 8.9–10.3)
Creatinine, Ser: 1.31 mg/dL — ABNORMAL HIGH (ref 0.61–1.24)
GFR calc Af Amer: >60 mL/min (ref >60)
GFR calc non Af Amer: >60 mL/min (ref >60)
GLUCOSE: 146 mg/dL — AB (ref 65–99)
POTASSIUM: 4.8 mmol/L (ref 3.5–5.1)
SODIUM: 134 mmol/L — AB (ref 135–145)

## 2015-06-19 LAB — CBC
HEMATOCRIT: 31.1 % — AB (ref 39.0–52.0)
Hemoglobin: 10.5 g/dL — ABNORMAL LOW (ref 13.0–17.0)
MCH: 32.1 pg (ref 26.0–34.0)
MCHC: 33.8 g/dL (ref 30.0–36.0)
MCV: 95.1 fL (ref 78.0–100.0)
Platelets: 70 10*3/uL — ABNORMAL LOW (ref 150–400)
RBC: 3.27 MIL/uL — ABNORMAL LOW (ref 4.22–5.81)
RDW: 14.4 % (ref 11.5–15.5)
WBC: 3.8 10*3/uL — ABNORMAL LOW (ref 4.0–10.5)

## 2015-06-19 LAB — GLUCOSE, CAPILLARY
GLUCOSE-CAPILLARY: 153 mg/dL — AB (ref 65–99)
Glucose-Capillary: 138 mg/dL — ABNORMAL HIGH (ref 65–99)
Glucose-Capillary: 168 mg/dL — ABNORMAL HIGH (ref 65–99)
Glucose-Capillary: 124 mg/dL — ABNORMAL HIGH (ref 65–99)

## 2015-06-19 MED ORDER — IPRATROPIUM-ALBUTEROL 0.5-2.5 (3) MG/3ML IN SOLN
3.0000 mL | Freq: Three times a day (TID) | RESPIRATORY_TRACT | Status: DC
  Administered 2015-06-20 – 2015-06-23 (×11): 3 mL via RESPIRATORY_TRACT
  Filled 2015-06-19 (×14): qty 3

## 2015-06-19 MED ORDER — IPRATROPIUM-ALBUTEROL 0.5-2.5 (3) MG/3ML IN SOLN
3.0000 mL | Freq: Four times a day (QID) | RESPIRATORY_TRACT | Status: DC
  Administered 2015-06-19: 3 mL via RESPIRATORY_TRACT
  Filled 2015-06-19: qty 3

## 2015-06-19 MED ORDER — DIPHENHYDRAMINE HCL 25 MG PO CAPS
25.0000 mg | ORAL_CAPSULE | Freq: Four times a day (QID) | ORAL | Status: DC | PRN
  Administered 2015-06-19 – 2015-06-26 (×9): 25 mg via ORAL
  Filled 2015-06-19 (×9): qty 1

## 2015-06-19 MED ORDER — FUROSEMIDE 20 MG PO TABS
20.0000 mg | ORAL_TABLET | Freq: Every day | ORAL | Status: DC
  Administered 2015-06-19 – 2015-06-24 (×6): 20 mg via ORAL
  Filled 2015-06-19 (×6): qty 1

## 2015-06-19 MED ORDER — SPIRONOLACTONE 25 MG PO TABS
25.0000 mg | ORAL_TABLET | Freq: Every day | ORAL | Status: DC
  Administered 2015-06-19 – 2015-06-21 (×3): 25 mg via ORAL
  Filled 2015-06-19 (×3): qty 1

## 2015-06-19 NOTE — Care Management Note (Signed)
Case Management Note  Patient Details  Name: [name] MRN: [id number] Date of Birth: 04/19/1961  Subjective/Objective:                    Action/Plan:   Expected Discharge Date:                  Expected Discharge Plan:  [place]  In-House Referral:  NA  Discharge planning Services  CM Consult  Post Acute Care Choice:  Home Health Choice offered to:  Patient  DME Arranged:  [name] DME Agency:     HH Arranged:    HH Agency:     Status of Service:  In process, will continue to follow  Medicare Important Message Given:    Date Medicare IM Given:    Medicare IM give by:    Date Additional Medicare IM Given:    Additional Medicare Important Message give by:     If discussed at [place] of Stay Meetings, dates discussed:  06/19/2015  Additional Comments:  Sherald Barge, RN 06/19/2015, 2:39 PM

## 2015-06-19 NOTE — Progress Notes (Signed)
[facility] PROGRESS NOTE  [name] OQH:[id number] DOB: [birth year] DOA: 06/11/2015 PCP: [name], MD Summary  54 year old male presented with complaints right leg and groin pain and swelling with redness. Management of cellulitis has been discussed with ID 11/5 with recommendations to continue to Vancomycin. General surgery has also consulted and does not believe surgical intervention is needed. He has been slowly improving, but still has extensive erythema. Per wound care outpatient wound care will be needed upon discharge. Anticipate discharge in the next 1-2 days.  Assessment/Plan: 1. Cellulitis of the RLE and right groin.  Blood cultures unremarkable. RLE [place] negative for DVT. Scrotal US negative for acute changes. Case discussed with Dr. [name] on call for Infectious disease 11/5, and images of cellulitis were reviewed. Will continue Vancomycin as recommended. He will need a total of 10-14 days of antibiotic therapy. Although his progress is slow, cellulitis is slowly improving. Will continue to encourage ambulation. Appreciate wound care recommendations.  2. Constipation. Will give enema. Continue lactulose 3. Hep C cirrhosis, chronic. Follow up outpatient GI. Restart lasix and aldactone 4. Hyperkalemia, resolved after a dose of kayexalate. 5. Hyponatremia. Improving, continue to replete. 6. DM type 2, stable. Continue to hold oral hypoglycemics and continue SSI. 7. GERD, continue PPI.  8. COPD.  Will continue bronchodilators. No evidence of wheezing.  9. Chronic thrombocytopenia, stable. Likely related to cirrhosis. Will continue to monitor.  10. Obesity.  Code Status: Full DVT prophylaxis: SCDs Family Communication: Discussed with patient who understands and has no concerns at this time. Disposition Plan: Anticipate discharge in the next 1-2 days    Consultants:  Surgery  Wound Care   Procedures:    Antibiotics:  Vancomycin 11/1>>   Fluconazole  11/1>>11/4  Aztreonam 10/31>>11/1  Flagyl 10/31>>11/1  Levofloxacin 11/3>> 11/5  Clindamycin 11/4>>11/5  HPI/Subjective: Feels that swelling in right leg is improving. Still feels pain in right leg.  Objective: Filed Vitals:   06/19/15 0537  BP: 132/74  Pulse: 88  Temp: 98.5 F (36.9 C)  Resp: 16    Intake/Output Summary (Last 24 hours) at 06/19/15 0656 Last data filed at 06/19/15 0537  Gross per 24 hour  Intake   1323 ml  Output    600 ml  Net    723 ml   Filed Weights   06/12/15 0500 06/14/15 0613 06/15/15 0436  Weight: 169.8 kg (374 lb 5.5 oz) 150.186 kg (331 lb 1.6 oz) 150.503 kg (331 lb 12.8 oz)    Exam:  General: NAD. Appears comfortable and comfortable.  Cardiovascular: s1, s2, RRR  Respiratory: clear bilaterally, No wheezing, rales or rhonchi Abdomen: soft, non tender, no distention , bowel sounds normal Musculoskeletal: Continued erythema in right leg and groin with slow improvement.    Data Reviewed: Basic Metabolic Panel:  Recent Labs Lab 06/14/15 0544 06/15/15 0621 06/16/15 0628 06/17/15 0730 06/18/15 0442  NA 133* 134* 132* 131* 132*  K 4.3 4.4 5.2* 4.7 5.5*  CL 103 102 101 101 101  CO2 26 27 26 25 26   GLUCOSE 117* 104* 158* 154* 145*  BUN 24* 25* 28* 33* 34*  CREATININE 1.07 1.12 1.29* 1.26* 1.46*  CALCIUM 7.6* 7.8* 7.9* 8.1* 8.2*   Liver Function Tests: No results for input(s): AST, ALT, ALKPHOS, BILITOT, PROT, ALBUMIN in the last 168 hours. No results for input(s): AMMONIA in the last 168 hours. CBC:  Recent Labs Lab 06/14/15 0544 06/16/15 0628 06/18/15 0442  WBC 3.9* 4.6 4.8  HGB 10.6*  12.1* 10.5*  HCT 31.5* 36.2* 30.9*  MCV 94.3 96.3 96.3  PLT 73* 83* 66*   Cardiac Enzymes: No results for input(s): CKTOTAL, CKMB, CKMBINDEX, TROPONINI in the last 168 hours. BNP (last 3 results)  Recent Labs  06/11/15 1546  BNP 89.0    ProBNP (last 3 results)  Recent Labs  07/31/14 1220  PROBNP 582.6*    CBG:  Recent  Labs Lab 06/17/15 2257 06/18/15 0731 06/18/15 1124 06/18/15 1629 06/18/15 2058  GLUCAP 135* 144* 141* 157* 170*    Recent Results (from the past 240 hour(s))  Blood culture (routine x 2)     Status: None   Collection Time: 06/11/15  3:28 PM  Result Value Ref Range Status   Specimen Description BLOOD LEFT HAND  Final   Special Requests BOTTLES DRAWN AEROBIC ONLY [place]  Final   Culture NO GROWTH 5 DAYS  Final   Report Status 06/16/2015 FINAL  Final  Blood culture (routine x 2)     Status: None   Collection Time: 06/11/15  3:47 PM  Result Value Ref Range Status   Specimen Description BLOOD RIGHT ANTECUBITAL  Final   Special Requests BOTTLES DRAWN AEROBIC AND ANAEROBIC [place]  Final   Culture NO GROWTH 5 DAYS  Final   Report Status 06/16/2015 FINAL  Final  Urine culture     Status: None   Collection Time: 06/11/15  4:00 PM  Result Value Ref Range Status   Specimen Description URINE, CLEAN CATCH  Final   Special Requests NONE  Final   Culture   Final    MULTIPLE SPECIES PRESENT, SUGGEST RECOLLECTION Performed at [facility]    Report Status 06/13/2015 FINAL  Final  MRSA PCR Screening     Status: None   Collection Time: 06/11/15  7:43 PM  Result Value Ref Range Status   MRSA by PCR NEGATIVE NEGATIVE Final    Comment:        The GeneXpert MRSA Assay (FDA approved for NASAL specimens only), is one component of a comprehensive MRSA colonization surveillance program. It is not intended to diagnose MRSA infection nor to guide or monitor treatment for MRSA infections.      Studies: No results found.  Scheduled Meds: . insulin aspart  0-20 Units Subcutaneous TID WC  . insulin aspart  0-5 Units Subcutaneous QHS  . insulin glargine  10 Units Subcutaneous Daily  . lactulose  10 g Oral TID  . milk and molasses  1 enema Rectal Once  . pantoprazole  40 mg Oral Daily  . rifaximin  550 mg Oral BID  . sodium chloride  3 mL Intravenous Q12H  . vancomycin  1,750 mg  Intravenous Q24H   Continuous Infusions:   Active Problems:   Hepatic cirrhosis (HCC)   Diabetes ([place])   Chronic hepatitis C with cirrhosis ([place])   Cellulitis   Cellulitis of right leg   Thrombocytopenia ([place])    Time spent: 25 minutes   Jehanzeb Memon. MD [facility] Pager [phone]. If 7PM-7AM, please contact night-coverage at [email or web address], password [facility] 06/19/2015, 6:56 AM  LOS: 8 days

## 2015-06-19 NOTE — Consult Note (Signed)
   [facility] CM Inpatient Consult   06/19/2015  [name] 30-Dec-1960 [id number]  Spoke with patient at bedside regarding [facility] services. Patient does not want to decide today about [facility] at this time. He will think on it and speak with wife. Patient given [facility] brochure and contact information for future reference. Inpatient case manager made aware. Of note, [facility] Care Management services would not replace or interfere with any services that are arranged by inpatient case management or social work. For additional questions or referrals please contact:  Royetta Crochet. [name], RN, BSN, [facility] Liaison [phone]

## 2015-06-19 NOTE — Clinical Documentation Improvement (Signed)
  Internal Medicine  Would you please help clarify the specific medical condition related to the clinical findings?   Acute Renal Failure/Acute Kidney Injury  Acute Tubular Necrosis  Acute Renal Cortical Necrosis  Acute Renal Medullary Necrosis  Other clinical explanation  Clinically Undetermined at this time  Document any associated diagnoses/conditions.   Supporting Information: On admission the BUN was 24 and creatinine was 1.07.  On 06/16/15 the BUN went to 26 and creatinine 1.29.  06/18/15 BUN 31 and creatinine is 1.31   Please exercise your independent, professional judgment when responding. A specific answer is not anticipated or expected.   Thank You,  Margretta Sidle, RN, BSN, MBA, [place] [phone]

## 2015-06-20 LAB — GLUCOSE, CAPILLARY
GLUCOSE-CAPILLARY: 178 mg/dL — AB (ref 65–99)
GLUCOSE-CAPILLARY: 179 mg/dL — AB (ref 65–99)
Glucose-Capillary: 131 mg/dL — ABNORMAL HIGH (ref 65–99)
Glucose-Capillary: 135 mg/dL — ABNORMAL HIGH (ref 65–99)

## 2015-06-20 LAB — CBC
HCT: 31.8 % — ABNORMAL LOW (ref 39.0–52.0)
HEMOGLOBIN: 10.7 g/dL — AB (ref 13.0–17.0)
MCH: 31.9 pg (ref 26.0–34.0)
MCHC: 33.6 g/dL (ref 30.0–36.0)
MCV: 94.9 fL (ref 78.0–100.0)
Platelets: 68 10*3/uL — ABNORMAL LOW (ref 150–400)
RBC: 3.35 MIL/uL — ABNORMAL LOW (ref 4.22–5.81)
RDW: 14.4 % (ref 11.5–15.5)
WBC: 3.8 10*3/uL — ABNORMAL LOW (ref 4.0–10.5)

## 2015-06-20 LAB — BASIC METABOLIC PANEL
ANION GAP: 5 (ref 5–15)
BUN: 30 mg/dL — ABNORMAL HIGH (ref 6–20)
CALCIUM: 8.2 mg/dL — AB (ref 8.9–10.3)
CO2: 26 mmol/L (ref 22–32)
Chloride: 101 mmol/L (ref 101–111)
Creatinine, Ser: 1.32 mg/dL — ABNORMAL HIGH (ref 0.61–1.24)
GLUCOSE: 142 mg/dL — AB (ref 65–99)
Potassium: 4.8 mmol/L (ref 3.5–5.1)
SODIUM: 132 mmol/L — AB (ref 135–145)

## 2015-06-20 NOTE — Progress Notes (Signed)
Patient was expressive about family concerns and anxiety related to his disease. As he is trying to restore a family relationship which is impacting him we discussed other areas of restoration as it relates to his health and faith. Listened and offered ideas about coping. Prayed also with him. He stated he felt much better.

## 2015-06-20 NOTE — Progress Notes (Addendum)
[facility] PROGRESS NOTE  [name] EAV:[id number] DOB: March 15, 1961 DOA: 06/11/2015 PCP: [name], MD  HPI/Brief narrative 54 year old male presented with complaints right leg and groin pain and swelling with redness. Management of cellulitis has been discussed with ID 11/5 with recommendations to continue to Vancomycin. General surgery has also consulted and does not believe surgical intervention is needed. He has been slowly improving, but still has extensive erythema. Per wound care outpatient wound care will be needed upon discharge.  Assessment/Plan: 1. Cellulitis of the RLE and right groin. Blood cultures unremarkable. RLE [place] negative for DVT. Scrotal US negative for acute changes.  1. Case discussed with Dr. [name] on call for Infectious disease 11/5, and images of cellulitis were reviewed. Recs to continue Vancomycin.  2. He will need a total of 10-14 days of antibiotic therapy.  3. Although his progress is slow, cellulitis is slowly improving.  4. Will continue to encourage ambulation.  2. Constipation. Continue lactulose and cathartics 3. Hep C cirrhosis, chronic. Followed by outpatient GI. Restarted lasix and aldactone 4. Hyperkalemia, resolved after a dose of kayexalate. 5. Hyponatremia. stable 6. DM type 2, stable. Continue to hold oral hypoglycemics and continue SSI. 7. GERD, continue PPI.  8. COPD. Will continue bronchodilators. No evidence of wheezing.  9. Chronic thrombocytopenia, stable. Likely related to cirrhosis. Will continue to monitor.  10. Obesity. Stable 11. Acute Renal Failure 1. Cr from 1.0 to peak of 1.4, now trending down to 1.3 2. Cautiously continue vanco for now per ID recommendations. Vanc dosing per Pharmacy  Code Status: Full Family Communication: Pt in room Disposition Plan: Pending   Consultants:    Procedures:    Antibiotics: Anti-infectives    Start     Dose/Rate Route Frequency Ordered Stop   06/19/15 1700   vancomycin (VANCOCIN) 1,750 mg in sodium chloride 0.9 % 500 mL IVPB     1,750 mg 250 mL/hr over 120 Minutes Intravenous Every 24 hours 06/18/15 1745     06/15/15 1700  vancomycin (VANCOCIN) 2,000 mg in sodium chloride 0.9 % 500 mL IVPB  Status:  Discontinued     2,000 mg 250 mL/hr over 120 Minutes Intravenous Every 24 hours 06/15/15 1311 06/18/15 1745   06/15/15 1400  clindamycin (CLEOCIN) IVPB 900 mg  Status:  Discontinued     900 mg 100 mL/hr over 30 Minutes Intravenous 3 times per day 06/15/15 1230 06/16/15 1630   06/14/15 1245  levofloxacin (LEVAQUIN) IVPB 750 mg  Status:  Discontinued     750 mg 100 mL/hr over 90 Minutes Intravenous Every 24 hours 06/14/15 1245 06/16/15 1630   06/13/15 0800  fluconazole (DIFLUCAN) IVPB 200 mg  Status:  Discontinued     200 mg 100 mL/hr over 60 Minutes Intravenous Every 24 hours 06/12/15 0854 06/16/15 0814   06/12/15 0900  fluconazole (DIFLUCAN) IVPB 400 mg     400 mg 100 mL/hr over 120 Minutes Intravenous  Once 06/12/15 0854 06/12/15 1148   06/12/15 0845  fluconazole (DIFLUCAN) IVPB 200 mg  Status:  Discontinued     200 mg 100 mL/hr over 60 Minutes Intravenous Every 24 hours 06/12/15 0842 06/12/15 0854   06/12/15 0400  vancomycin (VANCOCIN) 1,500 mg in sodium chloride 0.9 % 500 mL IVPB  Status:  Discontinued     1,500 mg 250 mL/hr over 120 Minutes Intravenous Every 12 hours 06/11/15 1547 06/15/15 1311   06/11/15 2300  aztreonam (AZACTAM) 2 g in dextrose 5 % 50 mL IVPB  Status:  Discontinued     2 g 100 mL/hr over 30 Minutes Intravenous 3 times per day 06/11/15 1530 06/12/15 1509   06/11/15 2300  metroNIDAZOLE (FLAGYL) IVPB 500 mg  Status:  Discontinued     500 mg 100 mL/hr over 60 Minutes Intravenous Every 8 hours 06/11/15 1530 06/12/15 0856   06/11/15 2200  rifaximin (XIFAXAN) tablet 550 mg     550 mg Oral 2 times daily 06/11/15 1942     06/11/15 1530  vancomycin (VANCOCIN) IVPB 1000 mg/200 mL premix  Status:  Discontinued     1,000 mg 200  mL/hr over 60 Minutes Intravenous  Once 06/11/15 1521 06/11/15 1526   06/11/15 1530  aztreonam (AZACTAM) 2 g in dextrose 5 % 50 mL IVPB     2 g 100 mL/hr over 30 Minutes Intravenous  Once 06/11/15 1522 06/11/15 1712   06/11/15 1530  metroNIDAZOLE (FLAGYL) IVPB 500 mg     500 mg 100 mL/hr over 60 Minutes Intravenous  Once 06/11/15 1522 06/11/15 1824   06/11/15 1530  vancomycin (VANCOCIN) 2,000 mg in sodium chloride 0.9 % 500 mL IVPB     2,000 mg 250 mL/hr over 120 Minutes Intravenous  Once 06/11/15 1526 06/11/15 1915      HPI/Subjective: Complains of continued R LE pain and swelling  Objective: Filed Vitals:   06/19/15 2141 06/20/15 0500 06/20/15 0724 06/20/15 1327  BP: 142/68 139/78    Pulse: 88 87    Temp: 98.2 F (36.8 C) 98.5 F (36.9 C)    TempSrc: Oral Oral    Resp: 18 18    Height:      Weight:      SpO2: 95% 96% 97% 99%    Intake/Output Summary (Last 24 hours) at 06/20/15 1511 Last data filed at 06/20/15 0800  Gross per 24 hour  Intake   1100 ml  Output      0 ml  Net   1100 ml   Filed Weights   06/12/15 0500 06/14/15 0613 06/15/15 0436  Weight: 169.8 kg (374 lb 5.5 oz) 150.186 kg (331 lb 1.6 oz) 150.503 kg (331 lb 12.8 oz)    Exam:   General:  Awake, in nad  Cardiovascular: regular, s1, s2  Respiratory: normal resp effort, no wheezing  Abdomen: soft, obese, nondistended  Musculoskeletal: perfused, RLE with dry dressings in place   Data Reviewed: Basic Metabolic Panel:  Recent Labs Lab 06/16/15 0628 06/17/15 0730 06/18/15 0442 06/19/15 0623 06/20/15 0532  NA 132* 131* 132* 134* 132*  K 5.2* 4.7 5.5* 4.8 4.8  CL 101 101 101 102 101  CO2 26 25 26 28 26   GLUCOSE 158* 154* 145* 146* 142*  BUN 28* 33* 34* 31* 30*  CREATININE 1.29* 1.26* 1.46* 1.31* 1.32*  CALCIUM 7.9* 8.1* 8.2* 8.1* 8.2*   Liver Function Tests: No results for input(s): AST, ALT, ALKPHOS, BILITOT, PROT, ALBUMIN in the last 168 hours. No results for input(s): LIPASE,  AMYLASE in the last 168 hours. No results for input(s): AMMONIA in the last 168 hours. CBC:  Recent Labs Lab 06/14/15 0544 06/16/15 0628 06/18/15 0442 06/19/15 0623 06/20/15 0532  WBC 3.9* 4.6 4.8 3.8* 3.8*  HGB 10.6* 12.1* 10.5* 10.5* 10.7*  HCT 31.5* 36.2* 30.9* 31.1* 31.8*  MCV 94.3 96.3 96.3 95.1 94.9  PLT 73* 83* 66* 70* 68*   Cardiac Enzymes: No results for input(s): CKTOTAL, CKMB, CKMBINDEX, TROPONINI in the last 168 hours. BNP (last 3 results)  Recent Labs  06/11/15 1546  BNP 89.0    ProBNP (last 3 results)  Recent Labs  07/31/14 1220  PROBNP 582.6*    CBG:  Recent Labs Lab 06/19/15 1108 06/19/15 1614 06/19/15 2045 06/20/15 0740 06/20/15 1127  GLUCAP 168* 153* 124* 131* 178*    Recent Results (from the past 240 hour(s))  Blood culture (routine x 2)     Status: None   Collection Time: 06/11/15  3:28 PM  Result Value Ref Range Status   Specimen Description BLOOD LEFT HAND  Final   Special Requests BOTTLES DRAWN AEROBIC ONLY [place]  Final   Culture NO GROWTH 5 DAYS  Final   Report Status 06/16/2015 FINAL  Final  Blood culture (routine x 2)     Status: None   Collection Time: 06/11/15  3:47 PM  Result Value Ref Range Status   Specimen Description BLOOD RIGHT ANTECUBITAL  Final   Special Requests BOTTLES DRAWN AEROBIC AND ANAEROBIC [place]  Final   Culture NO GROWTH 5 DAYS  Final   Report Status 06/16/2015 FINAL  Final  Urine culture     Status: None   Collection Time: 06/11/15  4:00 PM  Result Value Ref Range Status   Specimen Description URINE, CLEAN CATCH  Final   Special Requests NONE  Final   Culture   Final    MULTIPLE SPECIES PRESENT, SUGGEST RECOLLECTION Performed at [facility]    Report Status 06/13/2015 FINAL  Final  MRSA PCR Screening     Status: None   Collection Time: 06/11/15  7:43 PM  Result Value Ref Range Status   MRSA by PCR NEGATIVE NEGATIVE Final    Comment:        The GeneXpert MRSA Assay (FDA approved for  NASAL specimens only), is one component of a comprehensive MRSA colonization surveillance program. It is not intended to diagnose MRSA infection nor to guide or monitor treatment for MRSA infections.      Studies: No results found.  Scheduled Meds: . furosemide  20 mg Oral Daily  . insulin aspart  0-20 Units Subcutaneous TID WC  . insulin aspart  0-5 Units Subcutaneous QHS  . insulin glargine  10 Units Subcutaneous Daily  . ipratropium-albuterol  3 mL Nebulization TID  . lactulose  10 g Oral TID  . milk and molasses  1 enema Rectal Once  . pantoprazole  40 mg Oral Daily  . rifaximin  550 mg Oral BID  . sodium chloride  3 mL Intravenous Q12H  . spironolactone  25 mg Oral Daily  . vancomycin  1,750 mg Intravenous Q24H   Continuous Infusions:   Active Problems:   Hepatic cirrhosis (HCC)   Diabetes ([place])   Chronic hepatitis C with cirrhosis ([place])   Cellulitis   Cellulitis of right leg   Thrombocytopenia ([place])   [facility], [place] [facility] Pager [phone]. If 7PM-7AM, please contact night-coverage at [email or web address], password [facility] 06/20/2015, 3:11 PM  LOS: 9 days

## 2015-06-21 LAB — BASIC METABOLIC PANEL
Anion gap: 3 — ABNORMAL LOW (ref 5–15)
BUN: 31 mg/dL — AB (ref 6–20)
CALCIUM: 7.9 mg/dL — AB (ref 8.9–10.3)
CO2: 27 mmol/L (ref 22–32)
CREATININE: 1.4 mg/dL — AB (ref 0.61–1.24)
Chloride: 103 mmol/L (ref 101–111)
GFR calc Af Amer: >60 mL/min (ref >60)
GFR, EST NON AFRICAN AMERICAN: 56 mL/min — AB (ref >60)
GLUCOSE: 144 mg/dL — AB (ref 65–99)
Potassium: 5.3 mmol/L — ABNORMAL HIGH (ref 3.5–5.1)
Sodium: 133 mmol/L — ABNORMAL LOW (ref 135–145)

## 2015-06-21 LAB — CBC
HCT: 28.7 % — ABNORMAL LOW (ref 39.0–52.0)
HEMOGLOBIN: 9.8 g/dL — AB (ref 13.0–17.0)
MCH: 32.5 pg (ref 26.0–34.0)
MCHC: 34.1 g/dL (ref 30.0–36.0)
MCV: 95 fL (ref 78.0–100.0)
Platelets: 59 10*3/uL — ABNORMAL LOW (ref 150–400)
RBC: 3.02 MIL/uL — ABNORMAL LOW (ref 4.22–5.81)
RDW: 14.4 % (ref 11.5–15.5)
WBC: 3.3 10*3/uL — ABNORMAL LOW (ref 4.0–10.5)

## 2015-06-21 LAB — VANCOMYCIN, TROUGH: VANCOMYCIN TR: 19 ug/mL (ref 10.0–20.0)

## 2015-06-21 LAB — GLUCOSE, CAPILLARY
GLUCOSE-CAPILLARY: 133 mg/dL — AB (ref 65–99)
GLUCOSE-CAPILLARY: 162 mg/dL — AB (ref 65–99)
GLUCOSE-CAPILLARY: 151 mg/dL — AB (ref 65–99)
GLUCOSE-CAPILLARY: 152 mg/dL — AB (ref 65–99)

## 2015-06-21 MED ORDER — POLYETHYLENE GLYCOL 3350 17 G PO PACK: 17.0000 g | PACK | Freq: Every day | ORAL | Status: DC

## 2015-06-21 MED ORDER — FLUCONAZOLE 100 MG PO TABS
100.0000 mg | ORAL_TABLET | Freq: Every day | ORAL | Status: DC
  Administered 2015-06-22 – 2015-06-26 (×5): 100 mg via ORAL
  Filled 2015-06-21 (×5): qty 1

## 2015-06-21 MED ORDER — VANCOMYCIN HCL 10 G IV SOLR
1250.0000 mg | INTRAVENOUS | Status: DC
  Administered 2015-06-21 – 2015-06-24 (×4): 1250 mg via INTRAVENOUS
  Filled 2015-06-21 (×4): qty 1250

## 2015-06-21 MED ORDER — HYDROCERIN EX CREA
TOPICAL_CREAM | Freq: Three times a day (TID) | CUTANEOUS | Status: DC | PRN
  Administered 2015-06-21: 18:00:00 via TOPICAL
  Filled 2015-06-21: qty 113

## 2015-06-21 MED ORDER — FLUCONAZOLE 100 MG PO TABS
200.0000 mg | ORAL_TABLET | Freq: Once | ORAL | Status: AC
  Administered 2015-06-21: 200 mg via ORAL
  Filled 2015-06-21: qty 2

## 2015-06-21 MED ORDER — FUROSEMIDE 20 MG PO TABS
20.0000 mg | ORAL_TABLET | Freq: Every day | ORAL | Status: DC
Start: 2015-06-21 — End: 2015-06-25
  Administered 2015-06-21 – 2015-06-24 (×4): 20 mg via ORAL
  Filled 2015-06-21 (×4): qty 1

## 2015-06-21 MED ORDER — POLYETHYLENE GLYCOL 3350 17 G PO PACK
17.0000 g | PACK | Freq: Once | ORAL | Status: AC
  Administered 2015-06-21: 17 g via ORAL
  Filled 2015-06-21: qty 1

## 2015-06-21 MED ORDER — SODIUM POLYSTYRENE SULFONATE 15 GM/60ML PO SUSP
30.0000 g | Freq: Once | ORAL | Status: AC
Start: 2015-06-21 — End: 2015-06-21
  Administered 2015-06-21: 30 g via ORAL
  Filled 2015-06-21: qty 120

## 2015-06-21 NOTE — Progress Notes (Signed)
[facility] PROGRESS NOTE  [name] [id number] DOB: [birth year] DOA: 06/11/2015 PCP: [name], MD  HPI/Brief narrative 54 year old male presented with complaints right leg and groin pain and swelling with redness. Management of cellulitis has been discussed with ID 11/5 with recommendations to continue to Vancomycin. General surgery has also consulted and does not believe surgical intervention is needed. He has been slowly improving, but still has extensive erythema. Per wound care outpatient wound care will be needed upon discharge.  Assessment/Plan: 1. Cellulitis of the RLE and right groin. Blood cultures unremarkable. RLE [place] negative for DVT. Scrotal US negative for acute changes.  1. Case earlier discussed with Dr. [name] on call for Infectious disease on11/5, and images of cellulitis were reviewed. Recs to continue Vancomycin at that time 2. Per ID, pt would need a total of 10-14 days of antibiotic therapy.  3. Although his progress is slow, cellulitis is slowly improving. LE continues with patches of dense erythema and swelling  4. Will continue to encourage ambulation.  2. Constipation. Continue cathartics as need. Will give a trial of soap suds enema if needed 3. Hep C cirrhosis, chronic. Followed by outpatient GI. Restarted lasix and aldactone 4. Hyperkalemia, resolved after a dose of kayexalate. 5. Hyponatremia. stable 6. DM type 2, stable. Continue to hold oral hypoglycemics and continue SSI. 7. GERD, continue PPI.  8. COPD. Will continue bronchodilators. No evidence of wheezing.  9. Chronic thrombocytopenia, stable. Likely related to cirrhosis. Will continue to monitor.  10. Obesity. Stable 11. Acute Renal Failure 1. Cr from 1.0 to peak of 1.4, stable thus far 2. Cautiously continue vanco for now per ID recommendations. Vanc dosing per Pharmacy 12. Hyperkalemia 1. K at upper limits of normal 2. Will give one dose of kayexelate  Code Status:  Full Family Communication: Pt in room Disposition Plan: Pending   Consultants:    Procedures:    Antibiotics: Anti-infectives    Start     Dose/Rate Route Frequency Ordered Stop   06/19/15 1700  vancomycin (VANCOCIN) 1,750 mg in sodium chloride 0.9 % 500 mL IVPB     1,750 mg 250 mL/hr over 120 Minutes Intravenous Every 24 hours 06/18/15 1745     06/15/15 1700  vancomycin (VANCOCIN) 2,000 mg in sodium chloride 0.9 % 500 mL IVPB  Status:  Discontinued     2,000 mg 250 mL/hr over 120 Minutes Intravenous Every 24 hours 06/15/15 1311 06/18/15 1745   06/15/15 1400  clindamycin (CLEOCIN) IVPB 900 mg  Status:  Discontinued     900 mg 100 mL/hr over 30 Minutes Intravenous 3 times per day 06/15/15 1230 06/16/15 1630   06/14/15 1245  levofloxacin (LEVAQUIN) IVPB 750 mg  Status:  Discontinued     750 mg 100 mL/hr over 90 Minutes Intravenous Every 24 hours 06/14/15 1245 06/16/15 1630   06/13/15 0800  fluconazole (DIFLUCAN) IVPB 200 mg  Status:  Discontinued     200 mg 100 mL/hr over 60 Minutes Intravenous Every 24 hours 06/12/15 0854 06/16/15 0814   06/12/15 0900  fluconazole (DIFLUCAN) IVPB 400 mg     400 mg 100 mL/hr over 120 Minutes Intravenous  Once 06/12/15 0854 06/12/15 1148   06/12/15 0845  fluconazole (DIFLUCAN) IVPB 200 mg  Status:  Discontinued     200 mg 100 mL/hr over 60 Minutes Intravenous Every 24 hours 06/12/15 0842 06/12/15 0854   06/12/15 0400  vancomycin (VANCOCIN) 1,500 mg in sodium chloride 0.9 % 500 mL IVPB  Status:  Discontinued     1,500 mg 250 mL/hr over 120 Minutes Intravenous Every 12 hours 06/11/15 1547 06/15/15 1311   06/11/15 2300  aztreonam (AZACTAM) 2 g in dextrose 5 % 50 mL IVPB  Status:  Discontinued     2 g 100 mL/hr over 30 Minutes Intravenous 3 times per day 06/11/15 1530 06/12/15 1509   06/11/15 2300  metroNIDAZOLE (FLAGYL) IVPB 500 mg  Status:  Discontinued     500 mg 100 mL/hr over 60 Minutes Intravenous Every 8 hours 06/11/15 1530 06/12/15 0856    06/11/15 2200  rifaximin (XIFAXAN) tablet 550 mg     550 mg Oral 2 times daily 06/11/15 1942     06/11/15 1530  vancomycin (VANCOCIN) IVPB 1000 mg/200 mL premix  Status:  Discontinued     1,000 mg 200 mL/hr over 60 Minutes Intravenous  Once 06/11/15 1521 06/11/15 1526   06/11/15 1530  aztreonam (AZACTAM) 2 g in dextrose 5 % 50 mL IVPB     2 g 100 mL/hr over 30 Minutes Intravenous  Once 06/11/15 1522 06/11/15 1712   06/11/15 1530  metroNIDAZOLE (FLAGYL) IVPB 500 mg     500 mg 100 mL/hr over 60 Minutes Intravenous  Once 06/11/15 1522 06/11/15 1824   06/11/15 1530  vancomycin (VANCOCIN) 2,000 mg in sodium chloride 0.9 % 500 mL IVPB     2,000 mg 250 mL/hr over 120 Minutes Intravenous  Once 06/11/15 1526 06/11/15 1915      HPI/Subjective: Complains of constipation today  Objective: Filed Vitals:   06/21/15 0551 06/21/15 0743 06/21/15 1404 06/21/15 1425  BP: 153/77  150/63   Pulse: 84  76   Temp: 98.2 F (36.8 C)  98.5 F (36.9 C)   TempSrc: Oral     Resp: 18  20   Height:      Weight:      SpO2: 100% 97% 100% 97%    Intake/Output Summary (Last 24 hours) at 06/21/15 1604 Last data filed at 06/21/15 1200  Gross per 24 hour  Intake   1680 ml  Output    300 ml  Net   1380 ml   Filed Weights   06/12/15 0500 06/14/15 0613 06/15/15 0436  Weight: 169.8 kg (374 lb 5.5 oz) 150.186 kg (331 lb 1.6 oz) 150.503 kg (331 lb 12.8 oz)    Exam:   General:  Awake, in nad  Cardiovascular: regular, s1, s2  Respiratory: normal resp effort, no wheezing  Abdomen: soft, obese, decreased BS  Musculoskeletal: perfused, dense tender erythematous patch, some well demarcated over RLE with areas of blisters  Data Reviewed: Basic Metabolic Panel:  Recent Labs Lab 06/17/15 0730 06/18/15 0442 06/19/15 0623 06/20/15 0532 06/21/15 0540  NA 131* 132* 134* 132* 133*  K 4.7 5.5* 4.8 4.8 5.3*  CL 101 101 102 101 103  CO2 25 26 28 26 27   GLUCOSE 154* 145* 146* 142* 144*  BUN 33* 34*  31* 30* 31*  CREATININE 1.26* 1.46* 1.31* 1.32* 1.40*  CALCIUM 8.1* 8.2* 8.1* 8.2* 7.9*   Liver Function Tests: No results for input(s): AST, ALT, ALKPHOS, BILITOT, PROT, ALBUMIN in the last 168 hours. No results for input(s): LIPASE, AMYLASE in the last 168 hours. No results for input(s): AMMONIA in the last 168 hours. CBC:  Recent Labs Lab 06/16/15 0628 06/18/15 0442 06/19/15 0623 06/20/15 0532 06/21/15 0540  WBC 4.6 4.8 3.8* 3.8* 3.3*  HGB 12.1* 10.5* 10.5* 10.7* 9.8*  HCT 36.2* 30.9* 31.1* 31.8*  28.7*  MCV 96.3 96.3 95.1 94.9 95.0  PLT 83* 66* 70* 68* 59*   Cardiac Enzymes: No results for input(s): CKTOTAL, CKMB, CKMBINDEX, TROPONINI in the last 168 hours. BNP (last 3 results)  Recent Labs  06/11/15 1546  BNP 89.0    ProBNP (last 3 results)  Recent Labs  07/31/14 1220  PROBNP 582.6*    CBG:  Recent Labs Lab 06/20/15 1127 06/20/15 1646 06/20/15 2015 06/21/15 0733 06/21/15 1142  GLUCAP 178* 179* 135* 133* 162*    Recent Results (from the past 240 hour(s))  MRSA PCR Screening     Status: None   Collection Time: 06/11/15  7:43 PM  Result Value Ref Range Status   MRSA by PCR NEGATIVE NEGATIVE Final    Comment:        The GeneXpert MRSA Assay (FDA approved for NASAL specimens only), is one component of a comprehensive MRSA colonization surveillance program. It is not intended to diagnose MRSA infection nor to guide or monitor treatment for MRSA infections.      Studies: No results found.  Scheduled Meds: . furosemide  20 mg Oral Daily  . insulin aspart  0-20 Units Subcutaneous TID WC  . insulin aspart  0-5 Units Subcutaneous QHS  . insulin glargine  10 Units Subcutaneous Daily  . ipratropium-albuterol  3 mL Nebulization TID  . lactulose  10 g Oral TID  . milk and molasses  1 enema Rectal Once  . pantoprazole  40 mg Oral Daily  . rifaximin  550 mg Oral BID  . sodium chloride  3 mL Intravenous Q12H  . spironolactone  25 mg Oral Daily   . vancomycin  1,750 mg Intravenous Q24H   Continuous Infusions:   Active Problems:   Hepatic cirrhosis (HCC)   Diabetes ([place])   Chronic hepatitis C with cirrhosis ([place])   Cellulitis   Cellulitis of right leg   Thrombocytopenia ([place])   Jaleyah Longhi, [place] [facility] Pager [phone]. If 7PM-7AM, please contact night-coverage at [email or web address], password [facility] 06/21/2015, 4:04 PM  LOS: 10 days

## 2015-06-21 NOTE — Care Management Note (Signed)
Case Management Note  Patient Details  Name: [name] MRN: [id number] Date of Birth: February 28, 1961  Subjective/Objective:                    Action/Plan:   Expected Discharge Date:                  Expected Discharge Plan:  [place]  In-House Referral:  NA  Discharge planning Services  CM Consult  Post Acute Care Choice:  Home Health Choice offered to:  Patient  DME Arranged:  [name] DME Agency:     HH Arranged:    HH Agency:     Status of Service:  In process, will continue to follow  Medicare Important Message Given:    Date Medicare IM Given:    Medicare IM give by:    Date Additional Medicare IM Given:    Additional Medicare Important Message give by:     If discussed at [place] of Stay Meetings, dates discussed:  06/21/15  Additional Comments: Per MD pts cellulitis is not improving greatly. Pt still on iV AB. Will continue to follow for discharge planning needs. [name] [place], RN 06/21/2015, 2:14 PM

## 2015-06-21 NOTE — Progress Notes (Signed)
ANTIBIOTIC CONSULT NOTE - FOLLOW UP  Pharmacy Consult for Vancomycin Indication: cellulitis   Allergies  Allergen Reactions  . Penicillins Hives    Has patient had a PCN reaction causing immediate rash, facial/tongue/throat swelling, SOB or lightheadedness with hypotension: Yes Has patient had a PCN reaction causing severe rash involving mucus membranes or skin necrosis: Yes Has patient had a PCN reaction that required hospitalization No Has patient had a PCN reaction occurring within the last 10 years: No If all of the above answers are "NO", then may proceed with Cephalosporin use.    Patient Measurements: Height: 5\' 6"  (167.6 cm) Weight: (!) 331 lb 12.8 oz (150.503 kg) IBW/kg (Calculated) : 63.8  Vital Signs: Temp: 98.5 F (36.9 C) (11/10 1404) Temp Source: Oral (11/10 0551) BP: 150/63 mmHg (11/10 1404) Pulse Rate: 76 (11/10 1404) Intake/Output from previous day: 11/09 0701 - 11/10 0700 In: 1560 [P.O.:1560] Out: 1050 [Urine:1050] Intake/Output from this shift: Total I/O In: 720 [P.O.:720] Out: -   Labs:  Recent Labs  06/19/15 0623 06/20/15 0532 06/21/15 0540  WBC 3.8* 3.8* 3.3*  HGB 10.5* 10.7* 9.8*  PLT 70* 68* 59*  CREATININE 1.31* 1.32* 1.40*   Estimated Creatinine Clearance: 85 mL/min (by C-G formula based on Cr of 1.4).  Recent Labs  06/21/15 1550  [place]     Microbiology: Recent Results (from the past 720 hour(s))  Blood culture (routine x 2)     Status: None   Collection Time: 06/11/15  3:28 PM  Result Value Ref Range Status   Specimen Description BLOOD LEFT HAND  Final   Special Requests BOTTLES DRAWN AEROBIC ONLY [place]  Final   Culture NO GROWTH 5 DAYS  Final   Report Status 06/16/2015 FINAL  Final  Blood culture (routine x 2)     Status: None   Collection Time: 06/11/15  3:47 PM  Result Value Ref Range Status   Specimen Description BLOOD RIGHT ANTECUBITAL  Final   Special Requests BOTTLES DRAWN AEROBIC AND ANAEROBIC [place]   Final   Culture NO GROWTH 5 DAYS  Final   Report Status 06/16/2015 FINAL  Final  Urine culture     Status: None   Collection Time: 06/11/15  4:00 PM  Result Value Ref Range Status   Specimen Description URINE, CLEAN CATCH  Final   Special Requests NONE  Final   Culture   Final    MULTIPLE SPECIES PRESENT, SUGGEST RECOLLECTION Performed at [facility]    Report Status 06/13/2015 FINAL  Final  MRSA PCR Screening     Status: None   Collection Time: 06/11/15  7:43 PM  Result Value Ref Range Status   MRSA by PCR NEGATIVE NEGATIVE Final    Comment:        The GeneXpert MRSA Assay (FDA approved for NASAL specimens only), is one component of a comprehensive MRSA colonization surveillance program. It is not intended to diagnose MRSA infection nor to guide or monitor treatment for MRSA infections.     Anti-infectives    Start     Dose/Rate Route Frequency Ordered Stop   06/19/15 1700  vancomycin (VANCOCIN) 1,750 mg in sodium chloride 0.9 % 500 mL IVPB     1,750 mg 250 mL/hr over 120 Minutes Intravenous Every 24 hours 06/18/15 1745     06/15/15 1700  vancomycin (VANCOCIN) 2,000 mg in sodium chloride 0.9 % 500 mL IVPB  Status:  Discontinued     2,000 mg 250 mL/hr over 120 Minutes  Intravenous Every 24 hours 06/15/15 1311 06/18/15 1745   06/15/15 1400  clindamycin (CLEOCIN) IVPB 900 mg  Status:  Discontinued     900 mg 100 mL/hr over 30 Minutes Intravenous 3 times per day 06/15/15 1230 06/16/15 1630   06/14/15 1245  levofloxacin (LEVAQUIN) IVPB 750 mg  Status:  Discontinued     750 mg 100 mL/hr over 90 Minutes Intravenous Every 24 hours 06/14/15 1245 06/16/15 1630   06/13/15 0800  fluconazole (DIFLUCAN) IVPB 200 mg  Status:  Discontinued     200 mg 100 mL/hr over 60 Minutes Intravenous Every 24 hours 06/12/15 0854 06/16/15 0814   06/12/15 0900  fluconazole (DIFLUCAN) IVPB 400 mg     400 mg 100 mL/hr over 120 Minutes Intravenous  Once 06/12/15 0854 06/12/15 1148   06/12/15  0845  fluconazole (DIFLUCAN) IVPB 200 mg  Status:  Discontinued     200 mg 100 mL/hr over 60 Minutes Intravenous Every 24 hours 06/12/15 0842 06/12/15 0854   06/12/15 0400  vancomycin (VANCOCIN) 1,500 mg in sodium chloride 0.9 % 500 mL IVPB  Status:  Discontinued     1,500 mg 250 mL/hr over 120 Minutes Intravenous Every 12 hours 06/11/15 1547 06/15/15 1311   06/11/15 2300  aztreonam (AZACTAM) 2 g in dextrose 5 % 50 mL IVPB  Status:  Discontinued     2 g 100 mL/hr over 30 Minutes Intravenous 3 times per day 06/11/15 1530 06/12/15 1509   06/11/15 2300  metroNIDAZOLE (FLAGYL) IVPB 500 mg  Status:  Discontinued     500 mg 100 mL/hr over 60 Minutes Intravenous Every 8 hours 06/11/15 1530 06/12/15 0856   06/11/15 2200  rifaximin (XIFAXAN) tablet 550 mg     550 mg Oral 2 times daily 06/11/15 1942     06/11/15 1530  vancomycin (VANCOCIN) IVPB 1000 mg/200 mL premix  Status:  Discontinued     1,000 mg 200 mL/hr over 60 Minutes Intravenous  Once 06/11/15 1521 06/11/15 1526   06/11/15 1530  aztreonam (AZACTAM) 2 g in dextrose 5 % 50 mL IVPB     2 g 100 mL/hr over 30 Minutes Intravenous  Once 06/11/15 1522 06/11/15 1712   06/11/15 1530  metroNIDAZOLE (FLAGYL) IVPB 500 mg     500 mg 100 mL/hr over 60 Minutes Intravenous  Once 06/11/15 1522 06/11/15 1824   06/11/15 1530  vancomycin (VANCOCIN) 2,000 mg in sodium chloride 0.9 % 500 mL IVPB     2,000 mg 250 mL/hr over 120 Minutes Intravenous  Once 06/11/15 1526 06/11/15 1915     Assessment: 54 yo man on vancomycin for cellulitis.  Vanc trough today is 19 mg/dL.  SrCr has increased to 1.4  Goal of Therapy:  Vancomycin trough level 10-15 mcg/ml  Plan:  Change Vancomycin to 1250 gm IV every 24 hours. F/u renal function and clinical course Monitor labs and levels as warranted  Isac Sarna, BS Vena [place], BCPS Clinical Pharmacist Pager [phone] 06/21/2015,4:38 PM

## 2015-06-22 LAB — CBC
HEMATOCRIT: 27.9 % — AB (ref 39.0–52.0)
Hemoglobin: 9.6 g/dL — ABNORMAL LOW (ref 13.0–17.0)
MCH: 32.8 pg (ref 26.0–34.0)
MCHC: 34.4 g/dL (ref 30.0–36.0)
MCV: 95.2 fL (ref 78.0–100.0)
PLATELETS: 58 10*3/uL — AB (ref 150–400)
RBC: 2.93 MIL/uL — ABNORMAL LOW (ref 4.22–5.81)
RDW: 14.4 % (ref 11.5–15.5)
WBC: 3.5 10*3/uL — AB (ref 4.0–10.5)

## 2015-06-22 LAB — BASIC METABOLIC PANEL
Anion gap: 4 — ABNORMAL LOW (ref 5–15)
BUN: 31 mg/dL — AB (ref 6–20)
CALCIUM: 7.7 mg/dL — AB (ref 8.9–10.3)
CO2: 26 mmol/L (ref 22–32)
CREATININE: 1.46 mg/dL — AB (ref 0.61–1.24)
Chloride: 102 mmol/L (ref 101–111)
GFR calc Af Amer: >60 mL/min (ref >60)
GFR, EST NON AFRICAN AMERICAN: 53 mL/min — AB (ref >60)
GLUCOSE: 144 mg/dL — AB (ref 65–99)
POTASSIUM: 4.9 mmol/L (ref 3.5–5.1)
SODIUM: 132 mmol/L — AB (ref 135–145)

## 2015-06-22 LAB — GLUCOSE, CAPILLARY
Glucose-Capillary: 142 mg/dL — ABNORMAL HIGH (ref 65–99)
Glucose-Capillary: 160 mg/dL — ABNORMAL HIGH (ref 65–99)
Glucose-Capillary: 152 mg/dL — ABNORMAL HIGH (ref 65–99)

## 2015-06-22 NOTE — Progress Notes (Signed)
[facility] PROGRESS NOTE  SHEMUEL Castillo [id number] DOB: March 07, 1961 DOA: 06/11/2015 PCP: [name], MD  HPI/Brief narrative 54 year old male presented with complaints right leg and groin pain and swelling with redness. Management of cellulitis has been discussed with ID 11/5 with recommendations to continue to Vancomycin. General surgery has also consulted and does not believe surgical intervention is needed. He has been slowly improving, but still has extensive erythema. Per wound care outpatient wound care will be needed upon discharge.  Assessment/Plan: 1. Cellulitis of the RLE and right groin. Blood cultures unremarkable. RLE [place] negative for DVT. Scrotal US negative for acute changes.  1. Case earlier discussed with Dr. [name] on call for Infectious disease on11/5, and images of cellulitis were reviewed. Recs to continue Vancomycin at that time 2. Per ID, pt would need a total of 10-14 days of antibiotic therapy.  3. Although his progress is slow, cellulitis is slowly improving. LE continues with patches of dense erythema and swelling  4. Started trial of diflucan on 11/10 for suspected superinfection with dermatophytosis  2. Constipation. Improved with cathartics overnight 3. Hep C cirrhosis, chronic. Followed by outpatient GI. Restarted lasix and aldactone 4. Hyperkalemia, resolved after a dose of kayexalate. 5. Hyponatremia. stable 6. DM type 2, stable. Continue to hold oral hypoglycemics and continue SSI. 7. GERD, continue PPI.  8. COPD. Will continue bronchodilators. No evidence of wheezing.  9. Chronic thrombocytopenia, stable. Likely related to cirrhosis. Will continue to monitor.  10. Obesity. Stable 11. Acute Renal Failure 1. Cr from 1.0 to peak of 1.4, stable thus far 2. Cautiously continue vanco for now per ID recommendations. Vanc dosing per Pharmacy 12. Hyperkalemia 1. Improved with kayexelate  Code Status: Full Family Communication: Pt in  room Disposition Plan: Pending   Consultants:    Procedures:    Antibiotics: Anti-infectives    Start     Dose/Rate Route Frequency Ordered Stop   06/22/15 1000  fluconazole (DIFLUCAN) tablet 100 mg     100 mg Oral Daily 06/21/15 1714     06/21/15 1730  fluconazole (DIFLUCAN) tablet 200 mg     200 mg Oral  Once 06/21/15 1714 06/21/15 1746   06/21/15 1700  vancomycin (VANCOCIN) 1,250 mg in sodium chloride 0.9 % 250 mL IVPB     1,250 mg 166.7 mL/hr over 90 Minutes Intravenous Every 24 hours 06/21/15 1643     06/19/15 1700  vancomycin (VANCOCIN) 1,750 mg in sodium chloride 0.9 % 500 mL IVPB  Status:  Discontinued     1,750 mg 250 mL/hr over 120 Minutes Intravenous Every 24 hours 06/18/15 1745 06/21/15 1643   06/15/15 1700  vancomycin (VANCOCIN) 2,000 mg in sodium chloride 0.9 % 500 mL IVPB  Status:  Discontinued     2,000 mg 250 mL/hr over 120 Minutes Intravenous Every 24 hours 06/15/15 1311 06/18/15 1745   06/15/15 1400  clindamycin (CLEOCIN) IVPB 900 mg  Status:  Discontinued     900 mg 100 mL/hr over 30 Minutes Intravenous 3 times per day 06/15/15 1230 06/16/15 1630   06/14/15 1245  levofloxacin (LEVAQUIN) IVPB 750 mg  Status:  Discontinued     750 mg 100 mL/hr over 90 Minutes Intravenous Every 24 hours 06/14/15 1245 06/16/15 1630   06/13/15 0800  fluconazole (DIFLUCAN) IVPB 200 mg  Status:  Discontinued     200 mg 100 mL/hr over 60 Minutes Intravenous Every 24 hours 06/12/15 0854 06/16/15 0814   06/12/15 0900  fluconazole (DIFLUCAN) IVPB  400 mg     400 mg 100 mL/hr over 120 Minutes Intravenous  Once 06/12/15 0854 06/12/15 1148   06/12/15 0845  fluconazole (DIFLUCAN) IVPB 200 mg  Status:  Discontinued     200 mg 100 mL/hr over 60 Minutes Intravenous Every 24 hours 06/12/15 0842 06/12/15 0854   06/12/15 0400  vancomycin (VANCOCIN) 1,500 mg in sodium chloride 0.9 % 500 mL IVPB  Status:  Discontinued     1,500 mg 250 mL/hr over 120 Minutes Intravenous Every 12 hours 06/11/15  1547 06/15/15 1311   06/11/15 2300  aztreonam (AZACTAM) 2 g in dextrose 5 % 50 mL IVPB  Status:  Discontinued     2 g 100 mL/hr over 30 Minutes Intravenous 3 times per day 06/11/15 1530 06/12/15 1509   06/11/15 2300  metroNIDAZOLE (FLAGYL) IVPB 500 mg  Status:  Discontinued     500 mg 100 mL/hr over 60 Minutes Intravenous Every 8 hours 06/11/15 1530 06/12/15 0856   06/11/15 2200  rifaximin (XIFAXAN) tablet 550 mg     550 mg Oral 2 times daily 06/11/15 1942     06/11/15 1530  vancomycin (VANCOCIN) IVPB 1000 mg/200 mL premix  Status:  Discontinued     1,000 mg 200 mL/hr over 60 Minutes Intravenous  Once 06/11/15 1521 06/11/15 1526   06/11/15 1530  aztreonam (AZACTAM) 2 g in dextrose 5 % 50 mL IVPB     2 g 100 mL/hr over 30 Minutes Intravenous  Once 06/11/15 1522 06/11/15 1712   06/11/15 1530  metroNIDAZOLE (FLAGYL) IVPB 500 mg     500 mg 100 mL/hr over 60 Minutes Intravenous  Once 06/11/15 1522 06/11/15 1824   06/11/15 1530  vancomycin (VANCOCIN) 2,000 mg in sodium chloride 0.9 % 500 mL IVPB     2,000 mg 250 mL/hr over 120 Minutes Intravenous  Once 06/11/15 1526 06/11/15 1915      HPI/Subjective: Reports feeling better today  Objective: Filed Vitals:   06/22/15 0627 06/22/15 0739 06/22/15 1358 06/22/15 1434  BP: 152/59   186/72  Pulse: 83   96  Temp: 98 F (36.7 C)   97.8 F (36.6 C)  TempSrc: Oral   Oral  Resp: 20   20  Height:      Weight:      SpO2: 98% 97% 97% 100%    Intake/Output Summary (Last 24 hours) at 06/22/15 1749 Last data filed at 06/22/15 1435  Gross per 24 hour  Intake    483 ml  Output    400 ml  Net     83 ml   Filed Weights   06/12/15 0500 06/14/15 0613 06/15/15 0436  Weight: 169.8 kg (374 lb 5.5 oz) 150.186 kg (331 lb 1.6 oz) 150.503 kg (331 lb 12.8 oz)    Exam:   General:  Awake, laying in bed, in nad  Cardiovascular: regular, s1, s2  Respiratory: normal resp effort, no crackles  Abdomen: soft, obese, decreased BS  Musculoskeletal:  perfused, dense tender erythematous patch, some well demarcated over RLE with areas of blisters  Data Reviewed: Basic Metabolic Panel:  Recent Labs Lab 06/18/15 0442 06/19/15 0623 06/20/15 0532 06/21/15 0540 06/22/15 0622  NA 132* 134* 132* 133* 132*  K 5.5* 4.8 4.8 5.3* 4.9  CL 101 102 101 103 102  CO2 26 28 26 27 26   GLUCOSE 145* 146* 142* 144* 144*  BUN 34* 31* 30* 31* 31*  CREATININE 1.46* 1.31* 1.32* 1.40* 1.46*  CALCIUM 8.2* 8.1* 8.2* 7.9*  7.7*   Liver Function Tests: No results for input(s): AST, ALT, ALKPHOS, BILITOT, PROT, ALBUMIN in the last 168 hours. No results for input(s): LIPASE, AMYLASE in the last 168 hours. No results for input(s): AMMONIA in the last 168 hours. CBC:  Recent Labs Lab 06/18/15 0442 06/19/15 0623 06/20/15 0532 06/21/15 0540 06/22/15 0622  WBC 4.8 3.8* 3.8* 3.3* 3.5*  HGB 10.5* 10.5* 10.7* 9.8* 9.6*  HCT 30.9* 31.1* 31.8* 28.7* 27.9*  MCV 96.3 95.1 94.9 95.0 95.2  PLT 66* 70* 68* 59* 58*   Cardiac Enzymes: No results for input(s): CKTOTAL, CKMB, CKMBINDEX, TROPONINI in the last 168 hours. BNP (last 3 results)  Recent Labs  06/11/15 1546  BNP 89.0    ProBNP (last 3 results)  Recent Labs  07/31/14 1220  PROBNP 582.6*    CBG:  Recent Labs Lab 06/21/15 1615 06/21/15 2108 06/22/15 0735 06/22/15 1133 06/22/15 1607  GLUCAP 151* 152* 142* 160* 152*    No results found for this or any previous visit (from the past 240 hour(s)).   Studies: No results found.  Scheduled Meds: . fluconazole  100 mg Oral Daily  . furosemide  20 mg Oral Daily  . furosemide  20 mg Oral QHS  . insulin aspart  0-20 Units Subcutaneous TID WC  . insulin aspart  0-5 Units Subcutaneous QHS  . insulin glargine  10 Units Subcutaneous Daily  . ipratropium-albuterol  3 mL Nebulization TID  . lactulose  10 g Oral TID  . pantoprazole  40 mg Oral Daily  . rifaximin  550 mg Oral BID  . sodium chloride  3 mL Intravenous Q12H  . vancomycin  1,250  mg Intravenous Q24H   Continuous Infusions:   Active Problems:   Hepatic cirrhosis (HCC)   Diabetes ([place])   Chronic hepatitis C with cirrhosis ([place])   Cellulitis   Cellulitis of right leg   Thrombocytopenia ([place])   Nico Rogness, [facility] Pager [phone]. If 7PM-7AM, please contact night-coverage at [email or web address], password [facility] 06/22/2015, 5:49 PM  LOS: 11 days

## 2015-06-22 NOTE — Progress Notes (Signed)
Patient c/o acid reflux, made MD aware, will follow new orders received.

## 2015-06-22 NOTE — Care Management Note (Signed)
Case Management Note  Patient Details  Name: [name] MRN: [id number] Date of Birth: 01/02/1961  Subjective/Objective:                    Action/Plan:   Expected Discharge Date:                  Expected Discharge Plan:  [place]  In-House Referral:  NA  Discharge planning Services  CM Consult  Post Acute Care Choice:  Home Health Choice offered to:  Patient  DME Arranged:  [name] DME Agency:     HH Arranged:  RN, PT [place] Agency:  [place]  Status of Service:  In process, will continue to follow  Medicare Important Message Given:    Date Medicare IM Given:    Medicare IM give by:    Date Additional Medicare IM Given:    Additional Medicare Important Message give by:     If discussed at [place] of Stay Meetings, dates discussed:    Additional Comments: Pts leg still very red, swollen, and painful. MD added IV fungal meds to IV vanc. If pt discharges over the weekend would like [facility] RN and PT with [facility]. Weekend staff will need to call and arrange. [name] Tovey, RN 06/22/2015, 11:26 AM

## 2015-06-22 NOTE — Care Management Important Message (Signed)
Important Message  Patient Details  Name: [name] MRN: [id number] Date of Birth: 09-05-1960   Medicare Important Message Given:  Yes    Joylene Draft, RN 06/22/2015, 11:28 AM

## 2015-06-23 ENCOUNTER — Inpatient Hospital Stay (HOSPITAL_COMMUNITY): Payer: Medicare Other

## 2015-06-23 LAB — GLUCOSE, CAPILLARY
GLUCOSE-CAPILLARY: 153 mg/dL — AB (ref 65–99)
GLUCOSE-CAPILLARY: 136 mg/dL — AB (ref 65–99)
Glucose-Capillary: 131 mg/dL — ABNORMAL HIGH (ref 65–99)
Glucose-Capillary: 155 mg/dL — ABNORMAL HIGH (ref 65–99)
Glucose-Capillary: 160 mg/dL — ABNORMAL HIGH (ref 65–99)

## 2015-06-23 LAB — BASIC METABOLIC PANEL
ANION GAP: 5 (ref 5–15)
BUN: 31 mg/dL — ABNORMAL HIGH (ref 6–20)
CHLORIDE: 102 mmol/L (ref 101–111)
CO2: 25 mmol/L (ref 22–32)
Calcium: 8 mg/dL — ABNORMAL LOW (ref 8.9–10.3)
Creatinine, Ser: 1.38 mg/dL — ABNORMAL HIGH (ref 0.61–1.24)
GFR calc non Af Amer: 57 mL/min — ABNORMAL LOW (ref >60)
Glucose, Bld: 167 mg/dL — ABNORMAL HIGH (ref 65–99)
POTASSIUM: 4.1 mmol/L (ref 3.5–5.1)
SODIUM: 132 mmol/L — AB (ref 135–145)

## 2015-06-23 LAB — CBC
HEMATOCRIT: 28.8 % — AB (ref 39.0–52.0)
HEMOGLOBIN: 9.8 g/dL — AB (ref 13.0–17.0)
MCH: 33 pg (ref 26.0–34.0)
MCHC: 34 g/dL (ref 30.0–36.0)
MCV: 97 fL (ref 78.0–100.0)
PLATELETS: 62 10*3/uL — AB (ref 150–400)
RBC: 2.97 MIL/uL — AB (ref 4.22–5.81)
RDW: 14.1 % (ref 11.5–15.5)
WBC: 3.9 10*3/uL — AB (ref 4.0–10.5)

## 2015-06-23 MED ORDER — PEG-KCL-NACL-NASULF-NA ASC-C 100 G PO SOLR
1.0000 | Freq: Once | ORAL | Status: DC
  Filled 2015-06-23: qty 1

## 2015-06-23 MED ORDER — PEG 3350-KCL-NA BICARB-NACL 420 G PO SOLR
4000.0000 mL | Freq: Once | ORAL | Status: AC
Start: 2015-06-23 — End: 2015-06-23
  Administered 2015-06-23: 4000 mL via ORAL

## 2015-06-23 NOTE — Progress Notes (Signed)
Dressing to right lower leg removed, Dr Earlie Counts assessed leg while dressing off.  Replaced dressing per order, xeroform/ABD pads/kerlix/ACE wrap.  Will continue to monitor.

## 2015-06-23 NOTE — Progress Notes (Signed)
Patient refused neb

## 2015-06-23 NOTE — Progress Notes (Signed)
[facility] PROGRESS NOTE  [name] [id number] DOB: [birth year] DOA: 06/11/2015 PCP: [name], MD  HPI/Brief narrative 54 year old male presented with complaints right leg and groin pain and swelling with redness. Management of cellulitis has been discussed with ID 11/5 with recommendations to continue to Vancomycin. General surgery has also consulted and does not believe surgical intervention is needed. He has been slowly improving, but still has extensive erythema. Per wound care outpatient wound care will be needed upon discharge.  Assessment/Plan: 1. Cellulitis of the RLE and right groin. Blood cultures unremarkable. RLE [place] negative for DVT. Scrotal US negative for acute changes.  1. Case earlier discussed with Dr. [name] on call for Infectious disease on11/5, and images of cellulitis were reviewed. Recs to continue Vancomycin at that time 2. Per ID, pt would need a total of 10-14 days of antibiotic therapy.  3. Although his progress is slow, cellulitis is slowly improving. LE continues with patches of dense erythema and swelling  4. Started trial of diflucan on 11/10 for suspected superinfection with dermatophytosis. Skin lesions appear to be healing now. 2. Abd pain/Constipation. Pt reports marked lower quadrant abd pain in the setting of routine narcotic use. Suspect constipation/impaction. Pt too obese for KUB to be of much use. Will therefore obtain CT abd/pelvis to eval for any acute process 3. Hep C cirrhosis, chronic. Followed by outpatient GI. Restarted lasix and aldactone 4. Hyperkalemia, resolved after a dose of kayexalate. 5. Hyponatremia. stable 6. DM type 2, stable. Continue to hold oral hypoglycemics and continue SSI. 7. GERD, continue PPI.  8. COPD. Will continue bronchodilators. No evidence of wheezing.  9. Chronic thrombocytopenia, stable. Likely related to cirrhosis. Will continue to monitor.  10. Obesity. Stable 11. Acute Renal  Failure 1. Cr from 1.0 to peak of 1.4, stable thus far 2. Cautiously continue vanco for now per ID recommendations. Vanc dosing per Pharmacy 12. Hyperkalemia 1. Improved with kayexelate  Code Status: Full Family Communication: Pt in room Disposition Plan: Home with [place] when cellulitis improved and constipation improved   Consultants:    Procedures:    Antibiotics: Anti-infectives    Start     Dose/Rate Route Frequency Ordered Stop   06/22/15 1000  fluconazole (DIFLUCAN) tablet 100 mg     100 mg Oral Daily 06/21/15 1714     06/21/15 1730  fluconazole (DIFLUCAN) tablet 200 mg     200 mg Oral  Once 06/21/15 1714 06/21/15 1746   06/21/15 1700  vancomycin (VANCOCIN) 1,250 mg in sodium chloride 0.9 % 250 mL IVPB     1,250 mg 166.7 mL/hr over 90 Minutes Intravenous Every 24 hours 06/21/15 1643     06/19/15 1700  vancomycin (VANCOCIN) 1,750 mg in sodium chloride 0.9 % 500 mL IVPB  Status:  Discontinued     1,750 mg 250 mL/hr over 120 Minutes Intravenous Every 24 hours 06/18/15 1745 06/21/15 1643   06/15/15 1700  vancomycin (VANCOCIN) 2,000 mg in sodium chloride 0.9 % 500 mL IVPB  Status:  Discontinued     2,000 mg 250 mL/hr over 120 Minutes Intravenous Every 24 hours 06/15/15 1311 06/18/15 1745   06/15/15 1400  clindamycin (CLEOCIN) IVPB 900 mg  Status:  Discontinued     900 mg 100 mL/hr over 30 Minutes Intravenous 3 times per day 06/15/15 1230 06/16/15 1630   06/14/15 1245  levofloxacin (LEVAQUIN) IVPB 750 mg  Status:  Discontinued     750 mg 100 mL/hr over 90 Minutes Intravenous  Every 24 hours 06/14/15 1245 06/16/15 1630   06/13/15 0800  fluconazole (DIFLUCAN) IVPB 200 mg  Status:  Discontinued     200 mg 100 mL/hr over 60 Minutes Intravenous Every 24 hours 06/12/15 0854 06/16/15 0814   06/12/15 0900  fluconazole (DIFLUCAN) IVPB 400 mg     400 mg 100 mL/hr over 120 Minutes Intravenous  Once 06/12/15 0854 06/12/15 1148   06/12/15 0845  fluconazole (DIFLUCAN) IVPB 200 mg   Status:  Discontinued     200 mg 100 mL/hr over 60 Minutes Intravenous Every 24 hours 06/12/15 0842 06/12/15 0854   06/12/15 0400  vancomycin (VANCOCIN) 1,500 mg in sodium chloride 0.9 % 500 mL IVPB  Status:  Discontinued     1,500 mg 250 mL/hr over 120 Minutes Intravenous Every 12 hours 06/11/15 1547 06/15/15 1311   06/11/15 2300  aztreonam (AZACTAM) 2 g in dextrose 5 % 50 mL IVPB  Status:  Discontinued     2 g 100 mL/hr over 30 Minutes Intravenous 3 times per day 06/11/15 1530 06/12/15 1509   06/11/15 2300  metroNIDAZOLE (FLAGYL) IVPB 500 mg  Status:  Discontinued     500 mg 100 mL/hr over 60 Minutes Intravenous Every 8 hours 06/11/15 1530 06/12/15 0856   06/11/15 2200  rifaximin (XIFAXAN) tablet 550 mg     550 mg Oral 2 times daily 06/11/15 1942     06/11/15 1530  vancomycin (VANCOCIN) IVPB 1000 mg/200 mL premix  Status:  Discontinued     1,000 mg 200 mL/hr over 60 Minutes Intravenous  Once 06/11/15 1521 06/11/15 1526   06/11/15 1530  aztreonam (AZACTAM) 2 g in dextrose 5 % 50 mL IVPB     2 g 100 mL/hr over 30 Minutes Intravenous  Once 06/11/15 1522 06/11/15 1712   06/11/15 1530  metroNIDAZOLE (FLAGYL) IVPB 500 mg     500 mg 100 mL/hr over 60 Minutes Intravenous  Once 06/11/15 1522 06/11/15 1824   06/11/15 1530  vancomycin (VANCOCIN) 2,000 mg in sodium chloride 0.9 % 500 mL IVPB     2,000 mg 250 mL/hr over 120 Minutes Intravenous  Once 06/11/15 1526 06/11/15 1915      HPI/Subjective: Complains of abd pain, primarily in the lower quadrants  Objective: Filed Vitals:   06/22/15 2013 06/22/15 2154 06/23/15 0729 06/23/15 0807  BP:  153/59 166/63   Pulse:  93 86   Temp:  98.2 F (36.8 C) 97.8 F (36.6 C)   TempSrc:  Oral Oral   Resp:  20 20   Height:      Weight:      SpO2: 97% 96% 93% 92%    Intake/Output Summary (Last 24 hours) at 06/23/15 1247 Last data filed at 06/23/15 0856  Gross per 24 hour  Intake    600 ml  Output      0 ml  Net    600 ml   Filed Weights    06/12/15 0500 06/14/15 0613 06/15/15 0436  Weight: 169.8 kg (374 lb 5.5 oz) 150.186 kg (331 lb 1.6 oz) 150.503 kg (331 lb 12.8 oz)    Exam:   General:  Awake, sitting in chair, in nad  Cardiovascular: regular, s1, s2  Respiratory: normal resp effort, no crackles  Abdomen: soft, obese, tender on min palpation  Musculoskeletal: perfused, dense tender erythematous patch, some well demarcated over RLE with areas of blisters, improving  Data Reviewed: Basic Metabolic Panel:  Recent Labs Lab 06/19/15 0623 06/20/15 0532 06/21/15 0540 06/22/15 [id number]  06/23/15 0448  NA 134* 132* 133* 132* 132*  K 4.8 4.8 5.3* 4.9 4.1  CL 102 101 103 102 102  CO2 28 26 27 26 25   GLUCOSE 146* 142* 144* 144* 167*  BUN 31* 30* 31* 31* 31*  CREATININE 1.31* 1.32* 1.40* 1.46* 1.38*  CALCIUM 8.1* 8.2* 7.9* 7.7* 8.0*   Liver Function Tests: No results for input(s): AST, ALT, ALKPHOS, BILITOT, PROT, ALBUMIN in the last 168 hours. No results for input(s): LIPASE, AMYLASE in the last 168 hours. No results for input(s): AMMONIA in the last 168 hours. CBC:  Recent Labs Lab 06/19/15 0623 06/20/15 0532 06/21/15 0540 06/22/15 0622 06/23/15 0448  WBC 3.8* 3.8* 3.3* 3.5* 3.9*  HGB 10.5* 10.7* 9.8* 9.6* 9.8*  HCT 31.1* 31.8* 28.7* 27.9* 28.8*  MCV 95.1 94.9 95.0 95.2 97.0  PLT 70* 68* 59* 58* 62*   Cardiac Enzymes: No results for input(s): CKTOTAL, CKMB, CKMBINDEX, TROPONINI in the last 168 hours. BNP (last 3 results)  Recent Labs  06/11/15 1546  BNP 89.0    ProBNP (last 3 results)  Recent Labs  07/31/14 1220  PROBNP 582.6*    CBG:  Recent Labs Lab 06/22/15 1133 06/22/15 1607 06/22/15 2153 06/23/15 0748 06/23/15 1128  GLUCAP 160* 152* 131* 155* 153*    No results found for this or any previous visit (from the past 240 hour(s)).   Studies: No results found.  Scheduled Meds: . fluconazole  100 mg Oral Daily  . furosemide  20 mg Oral Daily  . furosemide  20 mg Oral QHS  .  insulin aspart  0-20 Units Subcutaneous TID WC  . insulin aspart  0-5 Units Subcutaneous QHS  . insulin glargine  10 Units Subcutaneous Daily  . ipratropium-albuterol  3 mL Nebulization TID  . lactulose  10 g Oral TID  . pantoprazole  40 mg Oral Daily  . rifaximin  550 mg Oral BID  . sodium chloride  3 mL Intravenous Q12H  . vancomycin  1,250 mg Intravenous Q24H   Continuous Infusions:   Active Problems:   Hepatic cirrhosis (HCC)   Diabetes ([place])   Chronic hepatitis C with cirrhosis ([place])   Cellulitis   Cellulitis of right leg   Thrombocytopenia ([place])   [facility], [place] [facility] Pager [phone]. If 7PM-7AM, please contact night-coverage at [email or web address], password [facility] 06/23/2015, 12:47 PM  LOS: 12 days

## 2015-06-24 ENCOUNTER — Inpatient Hospital Stay (HOSPITAL_COMMUNITY): Payer: Medicare Other

## 2015-06-24 LAB — GLUCOSE, CAPILLARY
GLUCOSE-CAPILLARY: 177 mg/dL — AB (ref 65–99)
GLUCOSE-CAPILLARY: 176 mg/dL — AB (ref 65–99)
GLUCOSE-CAPILLARY: 175 mg/dL — AB (ref 65–99)
GLUCOSE-CAPILLARY: 133 mg/dL — AB (ref 65–99)

## 2015-06-24 LAB — CBC
HCT: 33.3 % — ABNORMAL LOW (ref 39.0–52.0)
HEMOGLOBIN: 11.5 g/dL — AB (ref 13.0–17.0)
MCH: 33 pg (ref 26.0–34.0)
MCHC: 34.5 g/dL (ref 30.0–36.0)
MCV: 95.7 fL (ref 78.0–100.0)
PLATELETS: 97 10*3/uL — AB (ref 150–400)
RBC: 3.48 MIL/uL — ABNORMAL LOW (ref 4.22–5.81)
RDW: 14.8 % (ref 11.5–15.5)
WBC: 10.3 10*3/uL (ref 4.0–10.5)

## 2015-06-24 LAB — COMPREHENSIVE METABOLIC PANEL
ALBUMIN: 2.3 g/dL — AB (ref 3.5–5.0)
ALT: 75 U/L — ABNORMAL HIGH (ref 17–63)
ANION GAP: 8 (ref 5–15)
AST: 197 U/L — AB (ref 15–41)
Alkaline Phosphatase: 118 U/L (ref 38–126)
BILIRUBIN TOTAL: 3.1 mg/dL — AB (ref 0.3–1.2)
BUN: 35 mg/dL — AB (ref 6–20)
CALCIUM: 8.3 mg/dL — AB (ref 8.9–10.3)
CHLORIDE: 100 mmol/L — AB (ref 101–111)
CO2: 24 mmol/L (ref 22–32)
CREATININE: 1.81 mg/dL — AB (ref 0.61–1.24)
GFR calc Af Amer: 48 mL/min — ABNORMAL LOW (ref >60)
GFR calc non Af Amer: 41 mL/min — ABNORMAL LOW (ref >60)
GLUCOSE: 190 mg/dL — AB (ref 65–99)
POTASSIUM: 4.7 mmol/L (ref 3.5–5.1)
SODIUM: 132 mmol/L — AB (ref 135–145)
TOTAL PROTEIN: 5.9 g/dL — AB (ref 6.5–8.1)

## 2015-06-24 LAB — PSA: PSA: 0.11 ng/mL (ref 0.00–4.00)

## 2015-06-24 LAB — VANCOMYCIN, TROUGH: VANCOMYCIN TR: 18 ug/mL (ref 10.0–20.0)

## 2015-06-24 MED ORDER — SODIUM CHLORIDE 0.9 % IV SOLN
INTRAVENOUS | Status: DC
  Administered 2015-06-24: 1000 mL via INTRAVENOUS
  Administered 2015-06-26: 11:00:00 via INTRAVENOUS

## 2015-06-24 MED ORDER — SORBITOL 70 % SOLN
960.0000 mL | TOPICAL_OIL | Freq: Once | ORAL | Status: AC
  Administered 2015-06-24: 960 mL via RECTAL
  Filled 2015-06-24: qty 240

## 2015-06-24 MED ORDER — IPRATROPIUM-ALBUTEROL 0.5-2.5 (3) MG/3ML IN SOLN
3.0000 mL | Freq: Four times a day (QID) | RESPIRATORY_TRACT | Status: DC | PRN
  Administered 2015-06-25: 3 mL via RESPIRATORY_TRACT
  Filled 2015-06-24: qty 3

## 2015-06-24 MED ORDER — VANCOMYCIN HCL IN DEXTROSE 1-5 GM/200ML-% IV SOLN
1000.0000 mg | INTRAVENOUS | Status: DC
  Administered 2015-06-25: 1000 mg via INTRAVENOUS
  Filled 2015-06-24 (×2): qty 200

## 2015-06-24 NOTE — Progress Notes (Signed)
Pt had several large formed BM's.

## 2015-06-24 NOTE — Progress Notes (Signed)
[facility] PROGRESS NOTE  [name] [id number] DOB: [birth year] DOA: 06/11/2015 PCP: [name], MD  HPI/Brief narrative 54 year old male presented with complaints right leg and groin pain and swelling with redness. Management of cellulitis has been discussed with ID 11/5 with recommendations to continue to Vancomycin. General surgery has also consulted and does not believe surgical intervention is needed. He has been slowly improving, but still has extensive erythema. Per wound care outpatient wound care will be needed upon discharge.  Assessment/Plan: 1. Cellulitis of the RLE and right groin. Blood cultures unremarkable. RLE [place] negative for DVT. Scrotal US negative for acute changes.  1. Case was earlier discussed with Dr. [name] on call for Infectious disease on11/5, and images of cellulitis were reviewed. Recs to continue Vancomycin at that time 2. Per ID, pt would need a total of 10-14 days of antibiotic therapy.  3. Although his progress is slow, cellulitis is slowly improving. LE continues with patches of dense erythema and swelling  4. Have started trial of diflucan on 11/10 for suspected superinfection with dermatophytosis. Skin lesions appear somewhat improved. 2. Abd pain/Constipation/Ileus. Pt reports marked lower quadrant abd pain in the setting of routine narcotic use. Suspect constipation/impaction. 1. CT abd with findings of significant stool burden. Multiple large BM noted overnight with cathartics 2. This AM, pt complains of nausea/vomiting. KUB done with findings suggestive of continued ileus. Would avoid narcotics if possible, keep NPO for now with gentle basal IVF 3. Hep C cirrhosis, chronic. Followed by outpatient GI. Restarted lasix and aldactone 4. Hyperkalemia, resolved after a dose of kayexalate. 5. Hyponatremia. stable 6. DM type 2, stable. Continue to hold oral hypoglycemics and continue SSI. 7. GERD, continue PPI.  8. COPD. Will continue  bronchodilators. No evidence of wheezing.  9. Chronic thrombocytopenia, stable. Likely related to cirrhosis. Will continue to monitor.  1. LFT's improving 10. Obesity. Stable 11. Acute Renal Failure 1. Cr from 1.0 to peak of 1.4, stable thus far 2. Cautiously continue vanco for now per ID recommendations. Vanc dosing per Pharmacy 12. Hyperkalemia 1. Improved with kayexelate  Code Status: Full Family Communication: Pt in room Disposition Plan: Home with [place] when cellulitis improved and constipation/ileus improved   Consultants:    Procedures:    Antibiotics: Anti-infectives    Start     Dose/Rate Route Frequency Ordered Stop   06/22/15 1000  fluconazole (DIFLUCAN) tablet 100 mg     100 mg Oral Daily 06/21/15 1714     06/21/15 1730  fluconazole (DIFLUCAN) tablet 200 mg     200 mg Oral  Once 06/21/15 1714 06/21/15 1746   06/21/15 1700  vancomycin (VANCOCIN) 1,250 mg in sodium chloride 0.9 % 250 mL IVPB     1,250 mg 166.7 mL/hr over 90 Minutes Intravenous Every 24 hours 06/21/15 1643     06/19/15 1700  vancomycin (VANCOCIN) 1,750 mg in sodium chloride 0.9 % 500 mL IVPB  Status:  Discontinued     1,750 mg 250 mL/hr over 120 Minutes Intravenous Every 24 hours 06/18/15 1745 06/21/15 1643   06/15/15 1700  vancomycin (VANCOCIN) 2,000 mg in sodium chloride 0.9 % 500 mL IVPB  Status:  Discontinued     2,000 mg 250 mL/hr over 120 Minutes Intravenous Every 24 hours 06/15/15 1311 06/18/15 1745   06/15/15 1400  clindamycin (CLEOCIN) IVPB 900 mg  Status:  Discontinued     900 mg 100 mL/hr over 30 Minutes Intravenous 3 times per day 06/15/15 1230 06/16/15 1630  06/14/15 1245  levofloxacin (LEVAQUIN) IVPB 750 mg  Status:  Discontinued     750 mg 100 mL/hr over 90 Minutes Intravenous Every 24 hours 06/14/15 1245 06/16/15 1630   06/13/15 0800  fluconazole (DIFLUCAN) IVPB 200 mg  Status:  Discontinued     200 mg 100 mL/hr over 60 Minutes Intravenous Every 24 hours 06/12/15 0854 06/16/15  0814   06/12/15 0900  fluconazole (DIFLUCAN) IVPB 400 mg     400 mg 100 mL/hr over 120 Minutes Intravenous  Once 06/12/15 0854 06/12/15 1148   06/12/15 0845  fluconazole (DIFLUCAN) IVPB 200 mg  Status:  Discontinued     200 mg 100 mL/hr over 60 Minutes Intravenous Every 24 hours 06/12/15 0842 06/12/15 0854   06/12/15 0400  vancomycin (VANCOCIN) 1,500 mg in sodium chloride 0.9 % 500 mL IVPB  Status:  Discontinued     1,500 mg 250 mL/hr over 120 Minutes Intravenous Every 12 hours 06/11/15 1547 06/15/15 1311   06/11/15 2300  aztreonam (AZACTAM) 2 g in dextrose 5 % 50 mL IVPB  Status:  Discontinued     2 g 100 mL/hr over 30 Minutes Intravenous 3 times per day 06/11/15 1530 06/12/15 1509   06/11/15 2300  metroNIDAZOLE (FLAGYL) IVPB 500 mg  Status:  Discontinued     500 mg 100 mL/hr over 60 Minutes Intravenous Every 8 hours 06/11/15 1530 06/12/15 0856   06/11/15 2200  rifaximin (XIFAXAN) tablet 550 mg     550 mg Oral 2 times daily 06/11/15 1942     06/11/15 1530  vancomycin (VANCOCIN) IVPB 1000 mg/200 mL premix  Status:  Discontinued     1,000 mg 200 mL/hr over 60 Minutes Intravenous  Once 06/11/15 1521 06/11/15 1526   06/11/15 1530  aztreonam (AZACTAM) 2 g in dextrose 5 % 50 mL IVPB     2 g 100 mL/hr over 30 Minutes Intravenous  Once 06/11/15 1522 06/11/15 1712   06/11/15 1530  metroNIDAZOLE (FLAGYL) IVPB 500 mg     500 mg 100 mL/hr over 60 Minutes Intravenous  Once 06/11/15 1522 06/11/15 1824   06/11/15 1530  vancomycin (VANCOCIN) 2,000 mg in sodium chloride 0.9 % 500 mL IVPB     2,000 mg 250 mL/hr over 120 Minutes Intravenous  Once 06/11/15 1526 06/11/15 1915      HPI/Subjective: Complains of nausea/vomiting this AM  Objective: Filed Vitals:   06/23/15 1502 06/23/15 2027 06/23/15 2105 06/24/15 0700  BP: 143/60  157/73 155/65  Pulse: 107 96 102 94  Temp: 98.1 F (36.7 C)  98 F (36.7 C) 98.1 F (36.7 C)  TempSrc: Oral  Oral Oral  Resp: 20 20 20 20   Height:      Weight:       SpO2: 98% 98% 97% 95%    Intake/Output Summary (Last 24 hours) at 06/24/15 1242 Last data filed at 06/24/15 0906  Gross per 24 hour  Intake    753 ml  Output      0 ml  Net    753 ml   Filed Weights   06/12/15 0500 06/14/15 0613 06/15/15 0436  Weight: 169.8 kg (374 lb 5.5 oz) 150.186 kg (331 lb 1.6 oz) 150.503 kg (331 lb 12.8 oz)    Exam:   General:  Awake, laying in bed, appears uncomfortable  Cardiovascular: regular, s1, s2  Respiratory: normal resp effort, no crackles  Abdomen: soft, obese, generally tender  Musculoskeletal: perfused, dense tender erythematous patch, some well demarcated over RLE with areas  of blisters, improving  Data Reviewed: Basic Metabolic Panel:  Recent Labs Lab 06/20/15 0532 06/21/15 0540 06/22/15 0622 06/23/15 0448 06/24/15 0628  NA 132* 133* 132* 132* 132*  K 4.8 5.3* 4.9 4.1 4.7  CL 101 103 102 102 100*  CO2 26 27 26 25 24   GLUCOSE 142* 144* 144* 167* 190*  BUN 30* 31* 31* 31* 35*  CREATININE 1.32* 1.40* 1.46* 1.38* 1.81*  CALCIUM 8.2* 7.9* 7.7* 8.0* 8.3*   Liver Function Tests:  Recent Labs Lab 06/24/15 0628  AST 197*  ALT 75*  ALKPHOS 118  BILITOT 3.1*  PROT 5.9*  ALBUMIN 2.3*   No results for input(s): LIPASE, AMYLASE in the last 168 hours. No results for input(s): AMMONIA in the last 168 hours. CBC:  Recent Labs Lab 06/20/15 0532 06/21/15 0540 06/22/15 0622 06/23/15 0448 06/24/15 0628  WBC 3.8* 3.3* 3.5* 3.9* 10.3  HGB 10.7* 9.8* 9.6* 9.8* 11.5*  HCT 31.8* 28.7* 27.9* 28.8* 33.3*  MCV 94.9 95.0 95.2 97.0 95.7  PLT 68* 59* 58* 62* 97*   Cardiac Enzymes: No results for input(s): CKTOTAL, CKMB, CKMBINDEX, TROPONINI in the last 168 hours. BNP (last 3 results)  Recent Labs  06/11/15 1546  BNP 89.0    ProBNP (last 3 results)  Recent Labs  07/31/14 1220  PROBNP 582.6*    CBG:  Recent Labs Lab 06/23/15 1128 06/23/15 1626 06/23/15 2106 06/24/15 0735 06/24/15 1127  GLUCAP 153* 136* 160*  177* 176*    No results found for this or any previous visit (from the past 240 hour(s)).   Studies: Ct Abdomen Pelvis Wo Contrast  06/23/2015  CLINICAL DATA:  Lower abdominal pain and swelling. Concern for constipation/ impaction. History of diabetes, hypertension, splenomegaly, COPD, GERD, hepatitis-C and cirrhosis. EXAM: CT ABDOMEN AND PELVIS WITHOUT CONTRAST TECHNIQUE: Multidetector CT imaging of the abdomen and pelvis was performed following the standard protocol without IV contrast. COMPARISON:  01/15/2015 FINDINGS: Lower chest: Emphysematous changes are identified within the lung bases. Small right pleural effusion noted. Gynecomastia bilaterally. Heart size is normal. No imaged pericardial effusion or significant coronary artery calcifications. Upper abdomen: There is a ascites within the abdomen, particularly within the perihepatic region. The liver is scalloped and small consistent with cirrhotic morphology. The gallbladder is present and contains dependent and calcified gallstones. The spleen is markedly enlarged. Varices are identified within the left upper quadrant. Intrarenal calculus is identified within the left lower pole, measuring approximately 6 mm in diameter. There is no hydronephrosis. The right kidney is normal in appearance. Gastrointestinal tract: The stomach and proximal small bowel loops are normal in appearance. Distal small bowel loops contain fecal material. Proximal colonic loops are markedly distended with stool. There is gradual tapering to level of the rectum which is normal in caliber. However there is no obvious stricture or mass as a point of obstruction. Pelvis: The urinary bladder is decompressed. The prostate gland and seminal vesicles are normal in appearance. Retroperitoneum: There are enlarged lymph nodes in the right internal iliac chain, external iliac chain and common femoral chain. Largest is identified in the right common femoral region common measuring 2.4 x  3.9 cm on image 89 of series 2. Smaller lymph nodes are identified in the left pelvic sidewall better nonspecific. There are small bilateral and nonspecific inguinal lymph nodes. Enlargement of the left gonadal vein like related to splenorenal shunt and portal venous hypertension. Abdominal wall: Diffuse anasarca. Osseous structures: Spondylosis in the thoracolumbar spine. No suspicious lytic or blastic lesions  are identified. IMPRESSION: 1. Cirrhosis and marked splenomegaly. Splenorenal shunt. Findings consistent with portal venous hypertension. 2. Distended left gonadal vein, likely also related to portal venous hypertension and splenorenal shunt. 3. Significant amount of stool within the distal small bowel loops and proximal colon although there is no mass identified as a point of obstruction. Findings most consistent with colonic ileus. 4. Developing adenopathy in the pelvis. Right common femoral node is 2.4 x 3.9 cm. Malignancy should be considered. Correlation with PSA recommended. Nonspecific bilateral inguinal lymph nodes. 5. Cholelithiasis. 6. Nonobstructing left intrarenal calculus. Electronically Signed   By: Nolon Nations M.D.   On: 06/23/2015 18:23    Scheduled Meds: . fluconazole  100 mg Oral Daily  . furosemide  20 mg Oral QHS  . insulin aspart  0-20 Units Subcutaneous TID WC  . insulin aspart  0-5 Units Subcutaneous QHS  . insulin glargine  10 Units Subcutaneous Daily  . ipratropium-albuterol  3 mL Nebulization TID  . lactulose  10 g Oral TID  . pantoprazole  40 mg Oral Daily  . rifaximin  550 mg Oral BID  . sodium chloride  3 mL Intravenous Q12H  . vancomycin  1,250 mg Intravenous Q24H   Continuous Infusions: . sodium chloride 1,000 mL (06/24/15 1149)    Active Problems:   Hepatic cirrhosis (HCC)   Diabetes ([place])   Chronic hepatitis C with cirrhosis ([place])   Cellulitis   Cellulitis of right leg   Thrombocytopenia ([place])   [facility] Pager  [phone]. If 7PM-7AM, please contact night-coverage at [email or web address], password [facility] 06/24/2015, 12:42 PM  LOS: 13 days

## 2015-06-24 NOTE — Progress Notes (Signed)
ANTIBIOTIC CONSULT NOTE - FOLLOW UP  Pharmacy Consult for Vancomycin Indication: cellulitis   Allergies  Allergen Reactions  . Penicillins Hives    Has patient had a PCN reaction causing immediate rash, facial/tongue/throat swelling, SOB or lightheadedness with hypotension: Yes Has patient had a PCN reaction causing severe rash involving mucus membranes or skin necrosis: Yes Has patient had a PCN reaction that required hospitalization No Has patient had a PCN reaction occurring within the last 10 years: No If all of the above answers are "NO", then may proceed with Cephalosporin use.    Patient Measurements: Height: 5\' 6"  (167.6 cm) Weight: (!) 331 lb 12.8 oz (150.503 kg) IBW/kg (Calculated) : 63.8  Vital Signs: Temp: 98 F (36.7 C) (11/13 1606) Temp Source: Oral (11/13 0700) BP: 166/63 mmHg (11/13 1606) Pulse Rate: 86 (11/13 1606) Intake/Output from previous day: 11/12 0701 - 11/13 0700 In: 750 [IV Piggyback:750] Out: -  Intake/Output from this shift: Total I/O In: 3 [I.V.:3] Out: -   Labs:  Recent Labs  06/22/15 0622 06/23/15 0448 06/24/15 0628  WBC 3.5* 3.9* 10.3  HGB 9.6* 9.8* 11.5*  PLT 58* 62* 97*  CREATININE 1.46* 1.38* 1.81*   Estimated Creatinine Clearance: 65.8 mL/min (by C-G formula based on Cr of 1.81).  Recent Labs  06/24/15 1638  [place] [address]     Microbiology: Recent Results (from the past 720 hour(s))  Blood culture (routine x 2)     Status: None   Collection Time: 06/11/15  3:28 PM  Result Value Ref Range Status   Specimen Description BLOOD LEFT HAND  Final   Special Requests BOTTLES DRAWN AEROBIC ONLY [place]  Final   Culture NO GROWTH 5 DAYS  Final   Report Status 06/16/2015 FINAL  Final  Blood culture (routine x 2)     Status: None   Collection Time: 06/11/15  3:47 PM  Result Value Ref Range Status   Specimen Description BLOOD RIGHT ANTECUBITAL  Final   Special Requests BOTTLES DRAWN AEROBIC AND ANAEROBIC [place]  Final   Culture  NO GROWTH 5 DAYS  Final   Report Status 06/16/2015 FINAL  Final  Urine culture     Status: None   Collection Time: 06/11/15  4:00 PM  Result Value Ref Range Status   Specimen Description URINE, CLEAN CATCH  Final   Special Requests NONE  Final   Culture   Final    MULTIPLE SPECIES PRESENT, SUGGEST RECOLLECTION Performed at [facility]    Report Status 06/13/2015 FINAL  Final  MRSA PCR Screening     Status: None   Collection Time: 06/11/15  7:43 PM  Result Value Ref Range Status   MRSA by PCR NEGATIVE NEGATIVE Final    Comment:        The GeneXpert MRSA Assay (FDA approved for NASAL specimens only), is one component of a comprehensive MRSA colonization surveillance program. It is not intended to diagnose MRSA infection nor to guide or monitor treatment for MRSA infections.     Anti-infectives    Start     Dose/Rate Route Frequency Ordered Stop   06/22/15 1000  fluconazole (DIFLUCAN) tablet 100 mg     100 mg Oral Daily 06/21/15 1714     06/21/15 1730  fluconazole (DIFLUCAN) tablet 200 mg     200 mg Oral  Once 06/21/15 1714 06/21/15 1746   06/21/15 1700  vancomycin (VANCOCIN) 1,250 mg in sodium chloride 0.9 % 250 mL IVPB     1,250  mg 166.7 mL/hr over 90 Minutes Intravenous Every 24 hours 06/21/15 1643     06/19/15 1700  vancomycin (VANCOCIN) 1,750 mg in sodium chloride 0.9 % 500 mL IVPB  Status:  Discontinued     1,750 mg 250 mL/hr over 120 Minutes Intravenous Every 24 hours 06/18/15 1745 06/21/15 1643   06/15/15 1700  vancomycin (VANCOCIN) 2,000 mg in sodium chloride 0.9 % 500 mL IVPB  Status:  Discontinued     2,000 mg 250 mL/hr over 120 Minutes Intravenous Every 24 hours 06/15/15 1311 06/18/15 1745   06/15/15 1400  clindamycin (CLEOCIN) IVPB 900 mg  Status:  Discontinued     900 mg 100 mL/hr over 30 Minutes Intravenous 3 times per day 06/15/15 1230 06/16/15 1630   06/14/15 1245  levofloxacin (LEVAQUIN) IVPB 750 mg  Status:  Discontinued     750 mg 100 mL/hr  over 90 Minutes Intravenous Every 24 hours 06/14/15 1245 06/16/15 1630   06/13/15 0800  fluconazole (DIFLUCAN) IVPB 200 mg  Status:  Discontinued     200 mg 100 mL/hr over 60 Minutes Intravenous Every 24 hours 06/12/15 0854 06/16/15 0814   06/12/15 0900  fluconazole (DIFLUCAN) IVPB 400 mg     400 mg 100 mL/hr over 120 Minutes Intravenous  Once 06/12/15 0854 06/12/15 1148   06/12/15 0845  fluconazole (DIFLUCAN) IVPB 200 mg  Status:  Discontinued     200 mg 100 mL/hr over 60 Minutes Intravenous Every 24 hours 06/12/15 0842 06/12/15 0854   06/12/15 0400  vancomycin (VANCOCIN) 1,500 mg in sodium chloride 0.9 % 500 mL IVPB  Status:  Discontinued     1,500 mg 250 mL/hr over 120 Minutes Intravenous Every 12 hours 06/11/15 1547 06/15/15 1311   06/11/15 2300  aztreonam (AZACTAM) 2 g in dextrose 5 % 50 mL IVPB  Status:  Discontinued     2 g 100 mL/hr over 30 Minutes Intravenous 3 times per day 06/11/15 1530 06/12/15 1509   06/11/15 2300  metroNIDAZOLE (FLAGYL) IVPB 500 mg  Status:  Discontinued     500 mg 100 mL/hr over 60 Minutes Intravenous Every 8 hours 06/11/15 1530 06/12/15 0856   06/11/15 2200  rifaximin (XIFAXAN) tablet 550 mg     550 mg Oral 2 times daily 06/11/15 1942     06/11/15 1530  vancomycin (VANCOCIN) IVPB 1000 mg/200 mL premix  Status:  Discontinued     1,000 mg 200 mL/hr over 60 Minutes Intravenous  Once 06/11/15 1521 06/11/15 1526   06/11/15 1530  aztreonam (AZACTAM) 2 g in dextrose 5 % 50 mL IVPB     2 g 100 mL/hr over 30 Minutes Intravenous  Once 06/11/15 1522 06/11/15 1712   06/11/15 1530  metroNIDAZOLE (FLAGYL) IVPB 500 mg     500 mg 100 mL/hr over 60 Minutes Intravenous  Once 06/11/15 1522 06/11/15 1824   06/11/15 1530  vancomycin (VANCOCIN) 2,000 mg in sodium chloride 0.9 % 500 mL IVPB     2,000 mg 250 mL/hr over 120 Minutes Intravenous  Once 06/11/15 1526 06/11/15 1915     Assessment: 54 yo man on vancomycin for cellulitis.  Vanc trough today is 18 mg/dL.  SrCr has  increased to 1.81  Goal of Therapy:  Vancomycin trough level 10-15 mcg/ml  Plan:  Change Vancomycin to 1 gm IV every 24 hours. F/u renal function and clinical course Monitor labs and levels as warranted  Thanks for allowing pharmacy to be a part of this patient's care.  Excell Seltzer,  PharmD Clinical Pharmacist  06/24/2015,5:59 PM

## 2015-06-25 LAB — CBC
HCT: 29.5 % — ABNORMAL LOW (ref 39.0–52.0)
HEMOGLOBIN: 10 g/dL — AB (ref 13.0–17.0)
MCH: 32.6 pg (ref 26.0–34.0)
MCHC: 33.9 g/dL (ref 30.0–36.0)
MCV: 96.1 fL (ref 78.0–100.0)
PLATELETS: 70 10*3/uL — AB (ref 150–400)
RBC: 3.07 MIL/uL — AB (ref 4.22–5.81)
RDW: 15 % (ref 11.5–15.5)
WBC: 4 10*3/uL (ref 4.0–10.5)

## 2015-06-25 LAB — BASIC METABOLIC PANEL
ANION GAP: 7 (ref 5–15)
BUN: 43 mg/dL — ABNORMAL HIGH (ref 6–20)
CHLORIDE: 102 mmol/L (ref 101–111)
CO2: 24 mmol/L (ref 22–32)
CREATININE: 2.42 mg/dL — AB (ref 0.61–1.24)
Calcium: 7.7 mg/dL — ABNORMAL LOW (ref 8.9–10.3)
GFR calc non Af Amer: 29 mL/min — ABNORMAL LOW (ref >60)
GFR, EST AFRICAN AMERICAN: 33 mL/min — AB (ref >60)
Glucose, Bld: 145 mg/dL — ABNORMAL HIGH (ref 65–99)
POTASSIUM: 4.2 mmol/L (ref 3.5–5.1)
SODIUM: 133 mmol/L — AB (ref 135–145)

## 2015-06-25 LAB — GLUCOSE, CAPILLARY
GLUCOSE-CAPILLARY: 226 mg/dL — AB (ref 65–99)
Glucose-Capillary: 128 mg/dL — ABNORMAL HIGH (ref 65–99)
Glucose-Capillary: 101 mg/dL — ABNORMAL HIGH (ref 65–99)
Glucose-Capillary: 159 mg/dL — ABNORMAL HIGH (ref 65–99)

## 2015-06-25 NOTE — Progress Notes (Signed)
[facility] PROGRESS NOTE  [name] [id number] DOB: 1960/08/23 DOA: 06/11/2015 PCP: [name], MD  HPI/Brief narrative 54 year old male presented with complaints right leg and groin pain and swelling with redness. Management of cellulitis has been discussed with ID 11/5 with recommendations to continue to Vancomycin. General surgery has also consulted and does not believe surgical intervention is needed. He has been slowly improving, but still has extensive erythema. Per wound care outpatient wound care will be needed upon discharge.  Assessment/Plan: 1. Cellulitis of the RLE and right groin. Blood cultures unremarkable. RLE [place] negative for DVT. Scrotal US negative for acute changes.  1. Case was earlier discussed with Dr. [name] on call for Infectious disease on11/5, and images of cellulitis were reviewed. Recs to continue Vancomycin at that time 2. Per ID, pt would need a total of 10-14 days of antibiotic therapy.  3. Although his progress is slow, cellulitis appears to be slowly improving. LE continues with patches of dense erythema and swelling  4. Have started trial of diflucan on 11/10 for suspected superinfection with dermatophytosis. Skin lesions appear somewhat improved. 2. Abd pain/Constipation/Ileus. Pt reports marked lower quadrant abd pain in the setting of routine narcotic use. Suspect constipation/impaction. 1. CT abd with findings of significant stool burden.  2. Despite multiple cathartics, patient's ileus is slow to resolve. Positive BS on exam and pt is passing flatus. 3. Patient had been NPO overnight. Given return of bowel function, will start clear liquid diet and advance diet as tolerated 3. Hep C cirrhosis, chronic. Followed by outpatient GI. Restarted lasix and aldactone 4. Hyperkalemia, resolved after a dose of kayexalate. 5. Hyponatremia. stable 6. DM type 2, stable. Continue to hold oral hypoglycemics and continue SSI. 7. GERD, continue  PPI.  8. COPD. Will continue bronchodilators. No evidence of wheezing.  9. Chronic thrombocytopenia, stable. Likely related to cirrhosis. Will continue to monitor.  1. LFT's improving 10. Obesity. Stable 11. Acute Renal Failure 1. Cr from 1.0 to peak of 1.4, stable thus far 2. Cautiously continue vanco for now per ID recommendations. Vanc dosing per Pharmacy 12. Hyperkalemia 1. Improved with kayexelate 13. R inguinal Lymph Node 1. Noted incidentally on CT abd/pelvis 2. Have consulted IR with recs to re-scan in 4 weeks after completing abx and if node same or larger, would then biopsy at that time  Code Status: Full Family Communication: Pt in room Disposition Plan: Home with [place] when cellulitis improved and constipation/ileus improved   Consultants:    Procedures:    Antibiotics: Anti-infectives    Start     Dose/Rate Route Frequency Ordered Stop   06/25/15 1700  vancomycin (VANCOCIN) IVPB 1000 mg/200 mL premix     1,000 mg 200 mL/hr over 60 Minutes Intravenous Every 24 hours 06/24/15 1801     06/22/15 1000  fluconazole (DIFLUCAN) tablet 100 mg     100 mg Oral Daily 06/21/15 1714     06/21/15 1730  fluconazole (DIFLUCAN) tablet 200 mg     200 mg Oral  Once 06/21/15 1714 06/21/15 1746   06/21/15 1700  vancomycin (VANCOCIN) 1,250 mg in sodium chloride 0.9 % 250 mL IVPB  Status:  Discontinued     1,250 mg 166.7 mL/hr over 90 Minutes Intravenous Every 24 hours 06/21/15 1643 06/24/15 1801   06/19/15 1700  vancomycin (VANCOCIN) 1,750 mg in sodium chloride 0.9 % 500 mL IVPB  Status:  Discontinued     1,750 mg 250 mL/hr over 120 Minutes Intravenous Every  24 hours 06/18/15 1745 06/21/15 1643   06/15/15 1700  vancomycin (VANCOCIN) 2,000 mg in sodium chloride 0.9 % 500 mL IVPB  Status:  Discontinued     2,000 mg 250 mL/hr over 120 Minutes Intravenous Every 24 hours 06/15/15 1311 06/18/15 1745   06/15/15 1400  clindamycin (CLEOCIN) IVPB 900 mg  Status:  Discontinued     900  mg 100 mL/hr over 30 Minutes Intravenous 3 times per day 06/15/15 1230 06/16/15 1630   06/14/15 1245  levofloxacin (LEVAQUIN) IVPB 750 mg  Status:  Discontinued     750 mg 100 mL/hr over 90 Minutes Intravenous Every 24 hours 06/14/15 1245 06/16/15 1630   06/13/15 0800  fluconazole (DIFLUCAN) IVPB 200 mg  Status:  Discontinued     200 mg 100 mL/hr over 60 Minutes Intravenous Every 24 hours 06/12/15 0854 06/16/15 0814   06/12/15 0900  fluconazole (DIFLUCAN) IVPB 400 mg     400 mg 100 mL/hr over 120 Minutes Intravenous  Once 06/12/15 0854 06/12/15 1148   06/12/15 0845  fluconazole (DIFLUCAN) IVPB 200 mg  Status:  Discontinued     200 mg 100 mL/hr over 60 Minutes Intravenous Every 24 hours 06/12/15 0842 06/12/15 0854   06/12/15 0400  vancomycin (VANCOCIN) 1,500 mg in sodium chloride 0.9 % 500 mL IVPB  Status:  Discontinued     1,500 mg 250 mL/hr over 120 Minutes Intravenous Every 12 hours 06/11/15 1547 06/15/15 1311   06/11/15 2300  aztreonam (AZACTAM) 2 g in dextrose 5 % 50 mL IVPB  Status:  Discontinued     2 g 100 mL/hr over 30 Minutes Intravenous 3 times per day 06/11/15 1530 06/12/15 1509   06/11/15 2300  metroNIDAZOLE (FLAGYL) IVPB 500 mg  Status:  Discontinued     500 mg 100 mL/hr over 60 Minutes Intravenous Every 8 hours 06/11/15 1530 06/12/15 0856   06/11/15 2200  rifaximin (XIFAXAN) tablet 550 mg     550 mg Oral 2 times daily 06/11/15 1942     06/11/15 1530  vancomycin (VANCOCIN) IVPB 1000 mg/200 mL premix  Status:  Discontinued     1,000 mg 200 mL/hr over 60 Minutes Intravenous  Once 06/11/15 1521 06/11/15 1526   06/11/15 1530  aztreonam (AZACTAM) 2 g in dextrose 5 % 50 mL IVPB     2 g 100 mL/hr over 30 Minutes Intravenous  Once 06/11/15 1522 06/11/15 1712   06/11/15 1530  metroNIDAZOLE (FLAGYL) IVPB 500 mg     500 mg 100 mL/hr over 60 Minutes Intravenous  Once 06/11/15 1522 06/11/15 1824   06/11/15 1530  vancomycin (VANCOCIN) 2,000 mg in sodium chloride 0.9 % 500 mL IVPB      2,000 mg 250 mL/hr over 120 Minutes Intravenous  Once 06/11/15 1526 06/11/15 1915      HPI/Subjective: Reports passing large amounts of flatus overnight into this AM. Generally feels better but still bloated.  Objective: Filed Vitals:   06/24/15 2134 06/25/15 0700 06/25/15 1254 06/25/15 1305  BP: 158/68 162/74  135/56  Pulse: 92 96  79  Temp: 97.5 F (36.4 C) 97.4 F (36.3 C)  99.1 F (37.3 C)  TempSrc:  Oral  Oral  Resp:  20  20  Height:      Weight:      SpO2: 99% 98% 98% 100%    Intake/Output Summary (Last 24 hours) at 06/25/15 1640 Last data filed at 06/25/15 1310  Gross per 24 hour  Intake 1951.25 ml  Output  0 ml  Net 1951.25 ml   Filed Weights   06/12/15 0500 06/14/15 0613 06/15/15 0436  Weight: 169.8 kg (374 lb 5.5 oz) 150.186 kg (331 lb 1.6 oz) 150.503 kg (331 lb 12.8 oz)    Exam:   General:  Awake, laying in bed, appears uncomfortable  Cardiovascular: regular, s1, s2  Respiratory: normal resp effort, no crackles  Abdomen: soft, obese, generally tender, pos bs  Musculoskeletal: perfused, dense tender erythematous patch, some well demarcated over RLE with areas of blisters, appear improving  Data Reviewed: Basic Metabolic Panel:  Recent Labs Lab 06/21/15 0540 06/22/15 0622 06/23/15 0448 06/24/15 0628 06/25/15 0452  NA 133* 132* 132* 132* 133*  K 5.3* 4.9 4.1 4.7 4.2  CL 103 102 102 100* 102  CO2 27 26 25 24 24   GLUCOSE 144* 144* 167* 190* 145*  BUN 31* 31* 31* 35* 43*  CREATININE 1.40* 1.46* 1.38* 1.81* 2.42*  CALCIUM 7.9* 7.7* 8.0* 8.3* 7.7*   Liver Function Tests:  Recent Labs Lab 06/24/15 0628  AST 197*  ALT 75*  ALKPHOS 118  BILITOT 3.1*  PROT 5.9*  ALBUMIN 2.3*   No results for input(s): LIPASE, AMYLASE in the last 168 hours. No results for input(s): AMMONIA in the last 168 hours. CBC:  Recent Labs Lab 06/21/15 0540 06/22/15 0622 06/23/15 0448 06/24/15 0628 06/25/15 0452  WBC 3.3* 3.5* 3.9* 10.3 4.0  HGB  9.8* 9.6* 9.8* 11.5* 10.0*  HCT 28.7* 27.9* 28.8* 33.3* 29.5*  MCV 95.0 95.2 97.0 95.7 96.1  PLT 59* 58* 62* 97* 70*   Cardiac Enzymes: No results for input(s): CKTOTAL, CKMB, CKMBINDEX, TROPONINI in the last 168 hours. BNP (last 3 results)  Recent Labs  06/11/15 1546  BNP 89.0    ProBNP (last 3 results)  Recent Labs  07/31/14 1220  PROBNP 582.6*    CBG:  Recent Labs Lab 06/24/15 1604 06/24/15 2133 06/25/15 0750 06/25/15 1134 06/25/15 1624  GLUCAP 175* 133* 128* 101* 226*    No results found for this or any previous visit (from the past 240 hour(s)).   Studies: Dg Abd Portable 1v  06/24/2015  CLINICAL DATA:  Abdominal pain, constipation/D ileus. EXAM: PORTABLE ABDOMEN - 1 VIEW COMPARISON:  CT abdomen and pelvis 06/23/2015. Chest in two views abdomen 01/29/2014. FINDINGS: The patient's size somewhat limits the study. Gaseous distention of small bowel is identified. There is a large volume of gas and stool in the ascending colon. IMPRESSION: Bowel gas pattern compatible with ileus. Large volume of gas and stool ascending colon. Electronically Signed   By: Inge Rise M.D.   On: 06/24/2015 13:02    Scheduled Meds: . fluconazole  100 mg Oral Daily  . insulin aspart  0-20 Units Subcutaneous TID WC  . insulin aspart  0-5 Units Subcutaneous QHS  . insulin glargine  10 Units Subcutaneous Daily  . lactulose  10 g Oral TID  . pantoprazole  40 mg Oral Daily  . rifaximin  550 mg Oral BID  . sodium chloride  3 mL Intravenous Q12H  . vancomycin  1,000 mg Intravenous Q24H   Continuous Infusions: . sodium chloride 100 mL/hr (06/25/15 0744)    Active Problems:   Hepatic cirrhosis (HCC)   Diabetes ([place])   Chronic hepatitis C with cirrhosis ([place])   Cellulitis   Cellulitis of right leg   Thrombocytopenia ([place])   [facility] Pager [phone]. If 7PM-7AM, please contact night-coverage at [email or web address], password [facility] 06/25/2015, 4:40 PM  LOS:  14 days

## 2015-06-25 NOTE — Care Management Note (Signed)
Case Management Note  Patient Details  Name: [name] MRN: [id number] Date of Birth: [birth year]  Subjective/Objective:                    Action/Plan:   Expected Discharge Date:                  Expected Discharge Plan:  [place]  In-House Referral:  NA  Discharge planning Services  CM Consult  Post Acute Care Choice:  Home Health Choice offered to:  Patient  DME Arranged:  [name] DME Agency:     HH Arranged:  RN, PT [name] Agency:  [name]  Status of Service:  In process, will continue to follow  Medicare Important Message Given:  Yes Date Medicare IM Given:    Medicare IM give by:    Date Additional Medicare IM Given:    Additional Medicare Important Message give by:     If discussed at [place] of Stay Meetings, dates discussed:    Additional Comments: Pt now with ileus. Pt is NPO. Will continue to follow. [name] [place] [facility], RN 06/25/2015, 2:45 PM

## 2015-06-25 NOTE — Progress Notes (Signed)
Patient ID: [name], male   DOB: [birth year], 54 y.o.   MRN: [id number]   Request received for possible biopsy of Rt inguinal Lymphadenopathy  Pt with Rt groin redness and swelling Admitted and now treated for cellulitis with Vancomycin Improving per notes  Dr Anselm Pancoast and Dr Annamaria Boots have both reviewed imaging and feel best recommendation would be to: [facility] all antibiotic treatment; Rescan 4 weeks and evaluate LAN If same or larger---would need to consider Biopsy  Will inform Dr Wyline Copas

## 2015-06-26 LAB — BASIC METABOLIC PANEL
ANION GAP: 5 (ref 5–15)
BUN: 41 mg/dL — ABNORMAL HIGH (ref 6–20)
CALCIUM: 7.6 mg/dL — AB (ref 8.9–10.3)
CO2: 24 mmol/L (ref 22–32)
CREATININE: 2.33 mg/dL — AB (ref 0.61–1.24)
Chloride: 104 mmol/L (ref 101–111)
GFR calc non Af Amer: 30 mL/min — ABNORMAL LOW (ref >60)
GFR, EST AFRICAN AMERICAN: 35 mL/min — AB (ref >60)
Glucose, Bld: 129 mg/dL — ABNORMAL HIGH (ref 65–99)
Potassium: 3.8 mmol/L (ref 3.5–5.1)
SODIUM: 133 mmol/L — AB (ref 135–145)

## 2015-06-26 LAB — CBC
HCT: 28.7 % — ABNORMAL LOW (ref 39.0–52.0)
HEMOGLOBIN: 9.8 g/dL — AB (ref 13.0–17.0)
MCH: 32.7 pg (ref 26.0–34.0)
MCHC: 34.1 g/dL (ref 30.0–36.0)
MCV: 95.7 fL (ref 78.0–100.0)
PLATELETS: 74 10*3/uL — AB (ref 150–400)
RBC: 3 MIL/uL — AB (ref 4.22–5.81)
RDW: 14.9 % (ref 11.5–15.5)
WBC: 3.4 10*3/uL — AB (ref 4.0–10.5)

## 2015-06-26 LAB — GLUCOSE, CAPILLARY
GLUCOSE-CAPILLARY: 132 mg/dL — AB (ref 65–99)
GLUCOSE-CAPILLARY: 177 mg/dL — AB (ref 65–99)

## 2015-06-26 MED ORDER — HYDROCERIN EX CREA: 1.0000 "application " | TOPICAL_CREAM | Freq: Three times a day (TID) | CUTANEOUS | Status: DC | PRN

## 2015-06-26 MED ORDER — OXYCODONE HCL 10 MG PO TABS: ORAL_TABLET | ORAL | Status: DC

## 2015-06-26 MED ORDER — FLUCONAZOLE 100 MG PO TABS: 100.0000 mg | ORAL_TABLET | Freq: Every day | ORAL | Status: DC

## 2015-06-26 NOTE — Care Management Note (Signed)
Case Management Note  Patient Details  Name: [name] MRN: [id number] Date of Birth: Dec 28, 1960  Subjective/Objective:                    Action/Plan:   Expected Discharge Date:                  Expected Discharge Plan:  [place]  In-House Referral:  NA  Discharge planning Services  CM Consult  Post Acute Care Choice:  Home Health Choice offered to:  Patient  DME Arranged:  [name] DME Agency:     HH Arranged:  RN, PT [place] Agency:  [place]  Status of Service:  In process, will continue to follow  Medicare Important Message Given:  Yes Date Medicare IM Given:    Medicare IM give by:    Date Additional Medicare IM Given:    Additional Medicare Important Message give by:     If discussed at [place] of Stay Meetings, dates discussed:  06/26/15  Additional Comments:  Joylene Draft, RN 06/26/2015, 1:48 PM

## 2015-06-26 NOTE — Care Management Note (Signed)
Case Management Note  Patient Details  Name: [name] MRN: [id number] Date of Birth: January 11, 1961  Subjective/Objective:                    Action/Plan:   Expected Discharge Date:                  Expected Discharge Plan:  [name]  In-House Referral:  NA  Discharge planning Services  CM Consult  Post Acute Care Choice:  Home Health Choice offered to:  Patient  DME Arranged:  Bedside commode DME Agency:  [place] Arranged:  RN, PT [facility] Agency:  [place]  Status of Service:  Completed, signed off  Medicare Important Message Given:  Yes Date Medicare IM Given:    Medicare IM give by:    Date Additional Medicare IM Given:    Additional Medicare Important Message give by:     If discussed at [place] of Stay Meetings, dates discussed:    Additional Comments: Pt discharged home today with [facility] RN (per pts choice) for wound care. Romualdo Bolk of [facility] is aware and will collect the pts information from the chart. [place] services to start within 48 hours of discharge. BSC will be delivered to pts room prior to discharge from [facility] (per pts choice). Pt and pts nurse aware of discharge arrangements. [name] [place], RN 06/26/2015, 2:59 PM

## 2015-06-26 NOTE — Progress Notes (Signed)
Nutrition Brief Note  Patient seen for LOS >10 days  Wt Readings from Last 15 Encounters:  06/15/15 331 lb 12.8 oz (150.503 kg)  05/31/15 320 lb (145.151 kg)  05/31/15 319 lb (144.697 kg)  04/24/15 312 lb (141.522 kg)  04/13/15 318 lb (144.244 kg)  04/10/15 312 lb (141.522 kg)  02/01/15 310 lb (140.615 kg)  01/26/15 306 lb (138.801 kg)  01/18/15 293 lb 1.6 oz (132.949 kg)  12/21/14 295 lb 12.8 oz (134.174 kg)  12/05/14 297 lb (134.718 kg)  11/15/14 290 lb (131.543 kg)  10/26/14 298 lb (135.172 kg)  10/10/14 293 lb (132.904 kg)  09/14/14 292 lb 9.6 oz (132.722 kg)   Body mass index is 53.58 kg/(m^2). Patient meets criteria for Morbidly obese based on current BMI.   Current diet order is CC, patient is consuming approximately 75-100% of meals at this time. Labs and medications reviewed.   MD waiting for pt's constipation/ileus to resolve before D/C. Patient has been eating >50% of meals when his diet order allows. He has no nutrition related complaints. Denied snacks/supplements. Asked for jello on each tray.   No nutrition interventions warranted at this time. If nutrition issues arise, please consult RD.   Burtis Junes RD, LDN Nutrition Pager: [phone] 06/26/2015 12:38 PM

## 2015-06-26 NOTE — Care Management Important Message (Signed)
Important Message  Patient Details  Name: [name] MRN: [id number] Date of Birth: 04-28-1961   Medicare Important Message Given:  Yes    Joylene Draft, RN 06/26/2015, 2:58 PM

## 2015-06-26 NOTE — Progress Notes (Signed)
Pt discharged into the care of his wife via wheelchair via private vehicle.  VS: WNL.  Denies pain at this time.  Drsg to right leg changed and intact upon discharge.  PIV removed, intact and without S&S of infection.  Discharge instructions reviewed with wife/pt.  Wife/pt verbalized understanding.  Prescriptions given to wife/pt.

## 2015-06-26 NOTE — Discharge Summary (Signed)
Physician Discharge Summary  [name] [id number] DOB: January 15, 1961 DOA: 06/11/2015  PCP: [name], MD  Admit date: 06/11/2015 Discharge date: 06/26/2015  Time spent: 45 minutes  Recommendations for Outpatient Follow-up:  -Will be discharged home today. -Will arrange for [facility] services to dress RLE. -Advised to follow up with PCP in 2 weeks. Will need BMET to follow renal function.   Discharge Diagnoses:  Active Problems:   Hepatic cirrhosis (HCC)   Diabetes ([place])   Chronic hepatitis C with cirrhosis ([place])   Cellulitis   Cellulitis of right leg   Thrombocytopenia ([place])   Discharge Condition: Stable and improved  Filed Weights   06/12/15 0500 06/14/15 [id number] 06/15/15 0436  Weight: 169.8 kg (374 lb 5.5 oz) 150.186 kg (331 lb 1.6 oz) 150.503 kg (331 lb 12.8 oz)    History of present illness:  As per Dr. Anastasio Champion 10/31: [name] is a 54 y.o. male  This is a 54 year old man, diabetic, history of alcoholic cirrhosis who now presents with right leg and groin pain and swelling with redness. He is have the symptoms the last 3 days and they seem to be getting worse. The pain in the leg is sharp and constant and seems to be getting worse, especially with ambulation. He has been feeling cold as if he might be having chills. There's been no documented fever. There is no cough or dyspnea or chest pain. There is no abdominal pain. There is no vomiting. He is now being admitted for management of his severe cellulitis of his right leg.  Hospital Course:   1. Cellulitis of the RLE and right groin. Blood cultures unremarkable. RLE [place] negative for DVT. Scrotal US negative for acute changes.  1. Case was earlier discussed with Dr. [name] on call for Infectious disease on11/5, and images of cellulitis were reviewed. Recs to continue Vancomycin at that time 2. Per ID, pt would need a total of 10-14 days of antibiotic therapy. Has completed 15 days of Vancomycin in the  hospital. 3. Although his progress is slow, cellulitis appears to be slowly improving. LE continues with patches of dense erythema and swelling  4. Have started trial of diflucan on 11/10 for suspected superinfection with dermatophytosis. Skin lesions appear somewhat improved. Will give 2 more days of diflucan on DC. 2. Abd pain/Constipation/Ileus. Pt reports marked lower quadrant abd pain in the setting of routine narcotic use. Suspect constipation/impaction. 1. CT abd with findings of significant stool burden.  2. Despite multiple cathartics, patient's ileus is slow to resolve. Positive BS on exam and pt is passing flatus. 3. Has been having BMs. Ileus is resolved. Tolerating diet without issues. 3. Hep C cirrhosis, chronic. Followed by outpatient GI. Restarted lasix and aldactone 4. Hyperkalemia, resolved after a dose of kayexalate. 5. Hyponatremia. stable 6. DM type 2, stable. Continue home medications. 7. GERD, continue PPI.  8. COPD. Will continue bronchodilators. No evidence of wheezing.  9. Chronic thrombocytopenia, stable. Likely related to cirrhosis. Will continue to monitor.  1. LFT's improving 10. Obesity. Stable 11. Acute Renal Failure 1. Cr down to 2.3 on DC. 2. Suspect likely has a component of CKD given long-standing h/o DM and HTN. 3. Will need BMET at time of hospital follow up.   Procedures:  None   Consultations:  None  Discharge Instructions  Discharge Instructions    Diet - low sodium heart healthy    Complete by:  As directed      Increase activity  slowly    Complete by:  As directed             Medication List    STOP taking these medications        clotrimazole 1 % cream  Commonly known as:  LOTRIMIN      TAKE these medications        albuterol 108 (90 BASE) MCG/ACT inhaler  Commonly known as:  PROVENTIL HFA;VENTOLIN HFA  Inhale 2 puffs into the lungs every 6 (six) hours as needed. Shortness of breath     cyclobenzaprine 5 MG  tablet  Commonly known as:  FLEXERIL  Take 25 mg by mouth daily as needed for muscle spasms.     fluconazole 100 MG tablet  Commonly known as:  DIFLUCAN  Take 1 tablet (100 mg total) by mouth daily.     furosemide 20 MG tablet  Commonly known as:  LASIX  Take 1 tablet (20 mg total) by mouth at bedtime.     glipiZIDE 10 MG tablet  Commonly known as:  GLUCOTROL  TAKE 1 TABLET (10 MG TOTAL) BY MOUTH DAILY.     hydrocerin Crea  Apply 1 application topically 3 (three) times daily as needed (for itching/dry skin).     hydrOXYzine 10 MG tablet  Commonly known as:  ATARAX/VISTARIL  TAKE 1 TABLET (10 MG TOTAL) BY MOUTH EVERY 4 (FOUR) HOURS AS NEEDED FOR ITCHING.     lactulose 10 GM/15ML solution  Commonly known as:  CHRONULAC  TAKE 15 MLS (10 G TOTAL) BY MOUTH 3 (THREE) TIMES DAILY.     LANTUS SOLOSTAR 100 UNIT/ML Solostar Pen  Generic drug:  Insulin Glargine  Inject 10 Units into the skin at bedtime.     omeprazole 20 MG capsule  Commonly known as:  PRILOSEC  Take 2 capsules (40 mg total) by mouth daily.     Oxycodone HCl 10 MG Tabs  Take 1 tab every 4 hours for pain     rifaximin 550 MG Tabs tablet  Commonly known as:  XIFAXAN  Take 1 tablet (550 mg total) by mouth 2 (two) times daily.     spironolactone 25 MG tablet  Commonly known as:  ALDACTONE  TAKE ON MONDAY-WEDNESDAY-FRIDAY       Allergies  Allergen Reactions  . Penicillins Hives    Has patient had a PCN reaction causing immediate rash, facial/tongue/throat swelling, SOB or lightheadedness with hypotension: Yes Has patient had a PCN reaction causing severe rash involving mucus membranes or skin necrosis: Yes Has patient had a PCN reaction that required hospitalization No Has patient had a PCN reaction occurring within the last 10 years: No If all of the above answers are "NO", then may proceed with Cephalosporin use.        Follow-up Information    Follow up with [facility].   Contact  information:   [address] [place] [address] [phone]       Follow up with [name], MD. Schedule an appointment as soon as possible for a visit in 2 weeks.   Specialty:  Family Medicine   Contact information:   [place] [address] [phone]        The results of significant diagnostics from this hospitalization (including imaging, microbiology, ancillary and laboratory) are listed below for reference.    Significant Diagnostic Studies: Ct Abdomen Pelvis Wo Contrast  06/23/2015  CLINICAL DATA:  Lower abdominal pain and swelling. Concern for  constipation/ impaction. History of diabetes, hypertension, splenomegaly, COPD, GERD, hepatitis-C and cirrhosis. EXAM: CT ABDOMEN AND PELVIS WITHOUT CONTRAST TECHNIQUE: Multidetector CT imaging of the abdomen and pelvis was performed following the standard protocol without IV contrast. COMPARISON:  01/15/2015 FINDINGS: Lower chest: Emphysematous changes are identified within the lung bases. Small right pleural effusion noted. Gynecomastia bilaterally. Heart size is normal. No imaged pericardial effusion or significant coronary artery calcifications. Upper abdomen: There is a ascites within the abdomen, particularly within the perihepatic region. The liver is scalloped and small consistent with cirrhotic morphology. The gallbladder is present and contains dependent and calcified gallstones. The spleen is markedly enlarged. Varices are identified within the left upper quadrant. Intrarenal calculus is identified within the left lower pole, measuring approximately 6 mm in diameter. There is no hydronephrosis. The right kidney is normal in appearance. Gastrointestinal tract: The stomach and proximal small bowel loops are normal in appearance. Distal small bowel loops contain fecal material. Proximal colonic loops are markedly distended with stool. There is gradual tapering to level of the rectum which is normal in  caliber. However there is no obvious stricture or mass as a point of obstruction. Pelvis: The urinary bladder is decompressed. The prostate gland and seminal vesicles are normal in appearance. Retroperitoneum: There are enlarged lymph nodes in the right internal iliac chain, external iliac chain and common femoral chain. Largest is identified in the right common femoral region common measuring 2.4 x 3.9 cm on image 89 of series 2. Smaller lymph nodes are identified in the left pelvic sidewall better nonspecific. There are small bilateral and nonspecific inguinal lymph nodes. Enlargement of the left gonadal vein like related to splenorenal shunt and portal venous hypertension. Abdominal wall: Diffuse anasarca. Osseous structures: Spondylosis in the thoracolumbar spine. No suspicious lytic or blastic lesions are identified. IMPRESSION: 1. Cirrhosis and marked splenomegaly. Splenorenal shunt. Findings consistent with portal venous hypertension. 2. Distended left gonadal vein, likely also related to portal venous hypertension and splenorenal shunt. 3. Significant amount of stool within the distal small bowel loops and proximal colon although there is no mass identified as a point of obstruction. Findings most consistent with colonic ileus. 4. Developing adenopathy in the pelvis. Right common femoral node is 2.4 x 3.9 cm. Malignancy should be considered. Correlation with PSA recommended. Nonspecific bilateral inguinal lymph nodes. 5. Cholelithiasis. 6. Nonobstructing left intrarenal calculus. Electronically Signed   By: Nolon Nations M.D.   On: 06/23/2015 18:23   Dg Tibia/fibula Right  06/11/2015  CLINICAL DATA:  Right leg cellulitis, pain and redness EXAM: RIGHT TIBIA AND FIBULA - 2 VIEW COMPARISON:  None. FINDINGS: Two views of the right tibia-fibula submitted. No acute fracture or subluxation. No evidence of osteomyelitis. Diffuse soft tissue swelling. No radiopaque foreign body. IMPRESSION: No acute fracture  or subluxation.  Diffuse soft tissue swelling. Electronically Signed   By: Lahoma Crocker M.D.   On: 06/11/2015 15:47   US Scrotum  06/11/2015  CLINICAL DATA:  Two day history of right lower extremity pain and erythema. Blistering involving the right groin. Current history of hypertension and diabetes. Current history of hepatic cirrhosis. EXAM: SCROTAL ULTRASOUND DOPPLER ULTRASOUND OF THE TESTICLES TECHNIQUE: Complete ultrasound examination of the testicles, epididymis, and other scrotal structures was performed. Color and spectral Doppler ultrasound were also utilized to evaluate blood flow to the testicles. COMPARISON:  CT abdomen pelvis 01/15/2015 which included the scrotum. FINDINGS: Right testicle Measurements: Approximately 4.8 x 1.7 x 2.6 cm. Normal parenchymal echotexture without mass or microlithiasis.  Normal color Doppler flow without evidence of hyperemia. Left testicle Measurements: Approximately 4.4 x 2.3 x 2.5 cm. Normal parenchymal echotexture without mass or microlithiasis. Intratesticular varix. Normal color Doppler flow otherwise without evidence of hyperemia. Right epididymis: Normal in size and appearance without evidence of hyperemia. Left epididymis: Normal in size and appearance without evidence of hyperemia. Hydrocele: Small right hydrocele. Moderately large left hydrocele. Present on the prior CT and likely unchanged. Varicocele:  Large left varicocele as noted on the prior CT. Pulsed Doppler interrogation of both testes demonstrates normal low resistance arterial and venous waveforms bilaterally. IMPRESSION: 1. No evidence of testicular torsion or epididymo-orchitis. 2. Large left varicocele, including an intratesticular varix. 3. Moderately large left hydrocele and small right hydrocele. 4. Above findings were present on the prior CT from June, 2016, and are not significantly changed Electronically Signed   By: Evangeline Dakin M.D.   On: 06/11/2015 17:24   US Venous Img Lower  Unilateral Right  06/11/2015  CLINICAL DATA:  Chronic right leg pain and swelling.  Cellulitis. EXAM: RIGHT LOWER EXTREMITY VENOUS DOPPLER ULTRASOUND TECHNIQUE: Gray-scale sonography with graded compression, as well as color Doppler and duplex ultrasound were performed to evaluate the lower extremity deep venous systems from the level of the common femoral vein and including the common femoral, femoral, profunda femoral, popliteal and calf veins including the posterior tibial, peroneal and gastrocnemius veins when visible. The superficial great saphenous vein was also interrogated. Spectral Doppler was utilized to evaluate flow at rest and with distal augmentation maneuvers in the common femoral, femoral and popliteal veins. The patient was unable to tolerate probe pressure in the groin and proximal thigh due to prominent soft tissue swelling and redness and weeping blisters in that area. COMPARISON:  Venous duplex ultrasound dated 11/15/2014 FINDINGS: Contralateral Common Femoral Vein: Not visualized. Common Femoral Vein: Not visualized. Saphenofemoral Junction: Not visualized. Profunda Femoral Vein: Not visualized. Femoral Vein: Proximal femoral vein was not visualized. No evidence of thrombus. Normal compressibility, respiratory phasicity and response to augmentation. Popliteal Vein: No evidence of thrombus. Normal compressibility, respiratory phasicity and response to augmentation. Calf Veins: No evidence of thrombus. Normal compressibility and flow on color Doppler imaging. The peroneal vein was not seen. Venous Reflux:  None. Other Findings:  None. IMPRESSION: No evidence of deep venous thrombosis. However, the study is limited due to the cellulitis and weeping wounds as described above. Electronically Signed   By: Lorriane Shire M.D.   On: 06/11/2015 17:28   [place] Art/ven Flow Abd Pelv Doppler  06/11/2015  CLINICAL DATA:  Two day history of right lower extremity pain and erythema. Blistering involving the  right groin. Current history of hypertension and diabetes. Current history of hepatic cirrhosis. EXAM: SCROTAL ULTRASOUND DOPPLER ULTRASOUND OF THE TESTICLES TECHNIQUE: Complete ultrasound examination of the testicles, epididymis, and other scrotal structures was performed. Color and spectral Doppler ultrasound were also utilized to evaluate blood flow to the testicles. COMPARISON:  CT abdomen pelvis 01/15/2015 which included the scrotum. FINDINGS: Right testicle Measurements: Approximately 4.8 x 1.7 x 2.6 cm. Normal parenchymal echotexture without mass or microlithiasis. Normal color Doppler flow without evidence of hyperemia. Left testicle Measurements: Approximately 4.4 x 2.3 x 2.5 cm. Normal parenchymal echotexture without mass or microlithiasis. Intratesticular varix. Normal color Doppler flow otherwise without evidence of hyperemia. Right epididymis: Normal in size and appearance without evidence of hyperemia. Left epididymis: Normal in size and appearance without evidence of hyperemia. Hydrocele: Small right hydrocele. Moderately large left hydrocele. Present on the  prior CT and likely unchanged. Varicocele:  Large left varicocele as noted on the prior CT. Pulsed Doppler interrogation of both testes demonstrates normal low resistance arterial and venous waveforms bilaterally. IMPRESSION: 1. No evidence of testicular torsion or epididymo-orchitis. 2. Large left varicocele, including an intratesticular varix. 3. Moderately large left hydrocele and small right hydrocele. 4. Above findings were present on the prior CT from June, 2016, and are not significantly changed Electronically Signed   By: Evangeline Dakin M.D.   On: 06/11/2015 17:24   Dg Chest Portable 1 View  06/11/2015  CLINICAL DATA:  Initial encounter for shortness of breath EXAM: PORTABLE CHEST 1 VIEW COMPARISON:  02/01/2015. FINDINGS: 1532 hours. Low volume film with lordotic patient positioning. Haziness over the lower lungs compatible with  superimposition of soft tissues. The lungs are clear without focal pneumonia, edema, pneumothorax or pleural effusion. Cardiopericardial silhouette is at upper limits of normal for size. Telemetry leads overlie the chest. IMPRESSION: Low volume film without acute cardiopulmonary findings. Electronically Signed   By: Misty Stanley M.D.   On: 06/11/2015 15:46   Dg Abd Portable 1v  06/24/2015  CLINICAL DATA:  Abdominal pain, constipation/D ileus. EXAM: PORTABLE ABDOMEN - 1 VIEW COMPARISON:  CT abdomen and pelvis 06/23/2015. Chest in two views abdomen 01/29/2014. FINDINGS: The patient's size somewhat limits the study. Gaseous distention of small bowel is identified. There is a large volume of gas and stool in the ascending colon. IMPRESSION: Bowel gas pattern compatible with ileus. Large volume of gas and stool ascending colon. Electronically Signed   By: Inge Rise M.D.   On: 06/24/2015 13:02    Microbiology: No results found for this or any previous visit (from the past 240 hour(s)).   Labs: Basic Metabolic Panel:  Recent Labs Lab 06/22/15 0622 06/23/15 0448 06/24/15 0628 06/25/15 0452 06/26/15 0448  NA 132* 132* 132* 133* 133*  K 4.9 4.1 4.7 4.2 3.8  CL 102 102 100* 102 104  CO2 26 25 24 24 24   GLUCOSE 144* 167* 190* 145* 129*  BUN 31* 31* 35* 43* 41*  CREATININE 1.46* 1.38* 1.81* 2.42* 2.33*  CALCIUM 7.7* 8.0* 8.3* 7.7* 7.6*   Liver Function Tests:  Recent Labs Lab 06/24/15 0628  AST 197*  ALT 75*  ALKPHOS 118  BILITOT 3.1*  PROT 5.9*  ALBUMIN 2.3*   No results for input(s): LIPASE, AMYLASE in the last 168 hours. No results for input(s): AMMONIA in the last 168 hours. CBC:  Recent Labs Lab 06/22/15 0622 06/23/15 0448 06/24/15 0628 06/25/15 0452 06/26/15 0448  WBC 3.5* 3.9* 10.3 4.0 3.4*  HGB 9.6* 9.8* 11.5* 10.0* 9.8*  HCT 27.9* 28.8* 33.3* 29.5* 28.7*  MCV 95.2 97.0 95.7 96.1 95.7  PLT 58* 62* 97* 70* 74*   Cardiac Enzymes: No results for input(s):  CKTOTAL, CKMB, CKMBINDEX, TROPONINI in the last 168 hours. BNP: BNP (last 3 results)  Recent Labs  06/11/15 1546  BNP 89.0    ProBNP (last 3 results)  Recent Labs  07/31/14 1220  PROBNP 582.6*    CBG:  Recent Labs Lab 06/25/15 1134 06/25/15 1624 06/25/15 2049 06/26/15 0755 06/26/15 1151  GLUCAP 101* 226* 159* 132* 177*       Signed:  HERNANDEZ ACOSTA,ESTELA  [facility] Pager: [phone] 06/26/2015, 2:51 PM

## 2015-06-27 ENCOUNTER — Telehealth: Payer: Self-pay | Admitting: *Deleted

## 2015-06-27 ENCOUNTER — Ambulatory Visit: Payer: Medicare Other | Admitting: Family Medicine

## 2015-06-27 NOTE — Telephone Encounter (Signed)
Spoke with patient's wife, [name], and patient was in the room with her.  Date of Discharge:06/26/15   Hospital/Facility: [facility]  Principal Discharge Diagnosis: Cellulitis RLE   Reason for Chronic Case Management: Hep C, diabetes   Call Completed and Appointment Scheduled: Yes, Date: 07/04/15 with Dr Sabra Heck  Patient is knowledgeable of his/her condition(s) and/or treatment: Yes  Family and/or caregiver is knowledgeable of patient's condition (s) and/or treatment: yes  Patient is receiving home health services: yes: [name] with [facility] is to come out this week to redress his leg  MEDICATION RECONCILIATION Medication list reviewed with patient: Yes  Patient is able to obtain needed medications: Yes  ACTIVITIES OF DAILY LIVING  Is the patient able to perform his/her own ADLs: No: Patien't wife is helping him with baths and all other ADLs. Home Health will also be out to evaluate him this week.   PATIENT EDUCATION Topics Discussed:Pain management, wound care, medications  They will call back if they have any questions or concerns prior to the appointment.

## 2015-06-28 ENCOUNTER — Telehealth: Payer: Self-pay

## 2015-06-28 NOTE — Telephone Encounter (Signed)
Cheryl with [facility] called wanting to know if we could see him sooner then December because he is wheezing and needs to have some fluid drawn off. I have him coming in the morning to see EG. He is aware of the appointment

## 2015-06-29 ENCOUNTER — Ambulatory Visit: Payer: Medicare Other | Admitting: Nurse Practitioner

## 2015-06-29 NOTE — Telephone Encounter (Signed)
Noted  

## 2015-07-02 ENCOUNTER — Emergency Department (HOSPITAL_COMMUNITY): Payer: Medicare Other

## 2015-07-02 ENCOUNTER — Encounter (HOSPITAL_COMMUNITY): Payer: Self-pay

## 2015-07-02 ENCOUNTER — Inpatient Hospital Stay (HOSPITAL_COMMUNITY)
Admission: EM | Admit: 2015-07-02 | Discharge: 2015-07-17 | DRG: 432 | Disposition: A | Discharge status: Home Health | Payer: Medicare Other | Attending: Internal Medicine | Admitting: Internal Medicine

## 2015-07-02 ENCOUNTER — Observation Stay (HOSPITAL_COMMUNITY): Payer: Medicare Other

## 2015-07-02 DIAGNOSIS — D6959 Other secondary thrombocytopenia: Secondary | ICD-10-CM | POA: Diagnosis present

## 2015-07-02 DIAGNOSIS — F17210 Nicotine dependence, cigarettes, uncomplicated: Secondary | ICD-10-CM | POA: Diagnosis present

## 2015-07-02 DIAGNOSIS — R0600 Dyspnea, unspecified: Secondary | ICD-10-CM

## 2015-07-02 DIAGNOSIS — L03115 Cellulitis of right lower limb: Secondary | ICD-10-CM | POA: Diagnosis present

## 2015-07-02 DIAGNOSIS — Z6841 Body Mass Index (BMI) 40.0 and over, adult: Secondary | ICD-10-CM

## 2015-07-02 DIAGNOSIS — K7460 Unspecified cirrhosis of liver: Secondary | ICD-10-CM

## 2015-07-02 DIAGNOSIS — K766 Portal hypertension: Secondary | ICD-10-CM | POA: Diagnosis present

## 2015-07-02 DIAGNOSIS — N5089 Other specified disorders of the male genital organs: Secondary | ICD-10-CM | POA: Diagnosis not present

## 2015-07-02 DIAGNOSIS — B182 Chronic viral hepatitis C: Secondary | ICD-10-CM | POA: Diagnosis present

## 2015-07-02 DIAGNOSIS — M79606 Pain in leg, unspecified: Secondary | ICD-10-CM | POA: Insufficient documentation

## 2015-07-02 DIAGNOSIS — I130 Hypertensive heart and chronic kidney disease with heart failure and stage 1 through stage 4 chronic kidney disease, or unspecified chronic kidney disease: Secondary | ICD-10-CM | POA: Diagnosis present

## 2015-07-02 DIAGNOSIS — R601 Generalized edema: Secondary | ICD-10-CM | POA: Diagnosis not present

## 2015-07-02 DIAGNOSIS — J441 Chronic obstructive pulmonary disease with (acute) exacerbation: Secondary | ICD-10-CM | POA: Diagnosis present

## 2015-07-02 DIAGNOSIS — N179 Acute kidney failure, unspecified: Secondary | ICD-10-CM | POA: Diagnosis present

## 2015-07-02 DIAGNOSIS — E119 Type 2 diabetes mellitus without complications: Secondary | ICD-10-CM

## 2015-07-02 DIAGNOSIS — E8809 Other disorders of plasma-protein metabolism, not elsewhere classified: Secondary | ICD-10-CM | POA: Diagnosis present

## 2015-07-02 DIAGNOSIS — K769 Liver disease, unspecified: Secondary | ICD-10-CM

## 2015-07-02 DIAGNOSIS — R339 Retention of urine, unspecified: Secondary | ICD-10-CM | POA: Diagnosis present

## 2015-07-02 DIAGNOSIS — K7031 Alcoholic cirrhosis of liver with ascites: Principal | ICD-10-CM | POA: Diagnosis present

## 2015-07-02 DIAGNOSIS — Z7189 Other specified counseling: Secondary | ICD-10-CM | POA: Insufficient documentation

## 2015-07-02 DIAGNOSIS — K7290 Hepatic failure, unspecified without coma: Secondary | ICD-10-CM | POA: Diagnosis present

## 2015-07-02 DIAGNOSIS — I5031 Acute diastolic (congestive) heart failure: Secondary | ICD-10-CM | POA: Diagnosis present

## 2015-07-02 DIAGNOSIS — E1122 Type 2 diabetes mellitus with diabetic chronic kidney disease: Secondary | ICD-10-CM | POA: Diagnosis present

## 2015-07-02 DIAGNOSIS — F419 Anxiety disorder, unspecified: Secondary | ICD-10-CM | POA: Diagnosis present

## 2015-07-02 DIAGNOSIS — Z515 Encounter for palliative care: Secondary | ICD-10-CM | POA: Insufficient documentation

## 2015-07-02 DIAGNOSIS — K219 Gastro-esophageal reflux disease without esophagitis: Secondary | ICD-10-CM | POA: Diagnosis present

## 2015-07-02 DIAGNOSIS — N183 Chronic kidney disease, stage 3 (moderate): Secondary | ICD-10-CM | POA: Diagnosis present

## 2015-07-02 DIAGNOSIS — D638 Anemia in other chronic diseases classified elsewhere: Secondary | ICD-10-CM | POA: Diagnosis present

## 2015-07-02 DIAGNOSIS — D696 Thrombocytopenia, unspecified: Secondary | ICD-10-CM | POA: Diagnosis present

## 2015-07-02 DIAGNOSIS — E7220 Disorder of urea cycle metabolism, unspecified: Secondary | ICD-10-CM

## 2015-07-02 DIAGNOSIS — Z794 Long term (current) use of insulin: Secondary | ICD-10-CM

## 2015-07-02 LAB — URINALYSIS, ROUTINE W REFLEX MICROSCOPIC
Bilirubin Urine: NEGATIVE
GLUCOSE, UA: NEGATIVE mg/dL
KETONES UR: NEGATIVE mg/dL
Nitrite: NEGATIVE
PH: 6 (ref 5.0–8.0)
Specific Gravity, Urine: 1.01 (ref 1.005–1.030)

## 2015-07-02 LAB — COMPREHENSIVE METABOLIC PANEL
ALK PHOS: 120 U/L (ref 38–126)
ALT: 71 U/L — AB (ref 17–63)
AST: 226 U/L — AB (ref 15–41)
Albumin: 2.2 g/dL — ABNORMAL LOW (ref 3.5–5.0)
Anion gap: 6 (ref 5–15)
BUN: 39 mg/dL — ABNORMAL HIGH (ref 6–20)
CALCIUM: 8.2 mg/dL — AB (ref 8.9–10.3)
CO2: 26 mmol/L (ref 22–32)
CREATININE: 2.22 mg/dL — AB (ref 0.61–1.24)
Chloride: 106 mmol/L (ref 101–111)
GFR calc non Af Amer: 32 mL/min — ABNORMAL LOW (ref >60)
GFR, EST AFRICAN AMERICAN: 37 mL/min — AB (ref >60)
GLUCOSE: 129 mg/dL — AB (ref 65–99)
Potassium: 4.9 mmol/L (ref 3.5–5.1)
SODIUM: 138 mmol/L (ref 135–145)
Total Bilirubin: 1.8 mg/dL — ABNORMAL HIGH (ref 0.3–1.2)
Total Protein: 6 g/dL — ABNORMAL LOW (ref 6.5–8.1)

## 2015-07-02 LAB — CBC WITH DIFFERENTIAL/PLATELET
BASOS PCT: 0 %
Basophils Absolute: 0 10*3/uL (ref 0.0–0.1)
EOS ABS: 0.1 10*3/uL (ref 0.0–0.7)
EOS PCT: 3 %
HCT: 30.8 % — ABNORMAL LOW (ref 39.0–52.0)
Hemoglobin: 10.4 g/dL — ABNORMAL LOW (ref 13.0–17.0)
Lymphocytes Relative: 25 %
Lymphs Abs: 0.9 10*3/uL (ref 0.7–4.0)
MCH: 32.6 pg (ref 26.0–34.0)
MCHC: 33.8 g/dL (ref 30.0–36.0)
MCV: 96.6 fL (ref 78.0–100.0)
MONO ABS: 0.8 10*3/uL (ref 0.1–1.0)
MONOS PCT: 22 %
NEUTROS PCT: 50 %
Neutro Abs: 1.9 10*3/uL (ref 1.7–7.7)
PLATELETS: 70 10*3/uL — AB (ref 150–400)
RBC: 3.19 MIL/uL — ABNORMAL LOW (ref 4.22–5.81)
RDW: 14 % (ref 11.5–15.5)
WBC: 3.7 10*3/uL — ABNORMAL LOW (ref 4.0–10.5)

## 2015-07-02 LAB — URINE MICROSCOPIC-ADD ON
Bacteria, UA: NONE SEEN
Squamous Epithelial / LPF: NONE SEEN

## 2015-07-02 LAB — PROTIME-INR
INR: 1.41 (ref 0.00–1.49)
PROTHROMBIN TIME: 17.4 s — AB (ref 11.6–15.2)

## 2015-07-02 LAB — GLUCOSE, CAPILLARY
GLUCOSE-CAPILLARY: 109 mg/dL — AB (ref 65–99)
GLUCOSE-CAPILLARY: 163 mg/dL — AB (ref 65–99)

## 2015-07-02 LAB — MAGNESIUM: Magnesium: 1.7 mg/dL (ref 1.7–2.4)

## 2015-07-02 LAB — AMMONIA: Ammonia: 55 umol/L — ABNORMAL HIGH (ref 9–35)

## 2015-07-02 MED ORDER — MORPHINE SULFATE (PF) 4 MG/ML IV SOLN
4.0000 mg | Freq: Once | INTRAVENOUS | Status: AC
  Administered 2015-07-02: 4 mg via INTRAVENOUS
  Filled 2015-07-02: qty 1

## 2015-07-02 MED ORDER — OXYCODONE HCL 5 MG PO TABS
10.0000 mg | ORAL_TABLET | ORAL | Status: DC | PRN
  Administered 2015-07-02 – 2015-07-10 (×31): 10 mg via ORAL
  Filled 2015-07-02 (×63): qty 2

## 2015-07-02 MED ORDER — ALBUTEROL SULFATE (2.5 MG/3ML) 0.083% IN NEBU
5.0000 mg | INHALATION_SOLUTION | Freq: Once | RESPIRATORY_TRACT | Status: AC
  Administered 2015-07-02: 5 mg via RESPIRATORY_TRACT
  Filled 2015-07-02: qty 6

## 2015-07-02 MED ORDER — SODIUM CHLORIDE 0.9 % IV SOLN
INTRAVENOUS | Status: DC
  Administered 2015-07-02: 11:00:00 via INTRAVENOUS

## 2015-07-02 MED ORDER — INSULIN ASPART 100 UNIT/ML ~~LOC~~ SOLN
0.0000 [IU] | Freq: Three times a day (TID) | SUBCUTANEOUS | Status: DC
  Administered 2015-07-03 – 2015-07-05 (×5): 2 [IU] via SUBCUTANEOUS
  Administered 2015-07-06: 3 [IU] via SUBCUTANEOUS
  Administered 2015-07-07 – 2015-07-08 (×3): 2 [IU] via SUBCUTANEOUS
  Administered 2015-07-09: 3 [IU] via SUBCUTANEOUS
  Administered 2015-07-10 – 2015-07-11 (×4): 2 [IU] via SUBCUTANEOUS
  Administered 2015-07-12: 3 [IU] via SUBCUTANEOUS
  Administered 2015-07-13 (×2): 2 [IU] via SUBCUTANEOUS
  Administered 2015-07-13 – 2015-07-14 (×2): 3 [IU] via SUBCUTANEOUS
  Administered 2015-07-14 (×2): 2 [IU] via SUBCUTANEOUS
  Administered 2015-07-15: 3 [IU] via SUBCUTANEOUS
  Administered 2015-07-15 (×2): 2 [IU] via SUBCUTANEOUS
  Administered 2015-07-16 (×2): 3 [IU] via SUBCUTANEOUS
  Administered 2015-07-16: 2 [IU] via SUBCUTANEOUS
  Administered 2015-07-17 (×2): 3 [IU] via SUBCUTANEOUS

## 2015-07-02 MED ORDER — SODIUM CHLORIDE 0.9 % IV SOLN
2500.0000 mg | Freq: Once | INTRAVENOUS | Status: AC
  Administered 2015-07-02: 2500 mg via INTRAVENOUS
  Filled 2015-07-02: qty 2500

## 2015-07-02 MED ORDER — SODIUM CHLORIDE 0.9 % IJ SOLN: 3.0000 mL | INTRAMUSCULAR | Status: DC | PRN

## 2015-07-02 MED ORDER — ONDANSETRON HCL 4 MG/2ML IJ SOLN: 4.0000 mg | Freq: Four times a day (QID) | INTRAMUSCULAR | Status: DC | PRN

## 2015-07-02 MED ORDER — SPIRONOLACTONE 25 MG PO TABS
25.0000 mg | ORAL_TABLET | Freq: Every day | ORAL | Status: DC
  Administered 2015-07-02 – 2015-07-05 (×4): 25 mg via ORAL
  Filled 2015-07-02 (×5): qty 1

## 2015-07-02 MED ORDER — INSULIN ASPART 100 UNIT/ML ~~LOC~~ SOLN
4.0000 [IU] | Freq: Three times a day (TID) | SUBCUTANEOUS | Status: DC
  Administered 2015-07-02 – 2015-07-17 (×43): 4 [IU] via SUBCUTANEOUS

## 2015-07-02 MED ORDER — SODIUM CHLORIDE 0.9 % IJ SOLN
3.0000 mL | Freq: Two times a day (BID) | INTRAMUSCULAR | Status: DC
  Administered 2015-07-02 – 2015-07-17 (×29): 3 mL via INTRAVENOUS

## 2015-07-02 MED ORDER — INSULIN ASPART 100 UNIT/ML ~~LOC~~ SOLN: 0.0000 [IU] | Freq: Every day | SUBCUTANEOUS | Status: DC

## 2015-07-02 MED ORDER — FUROSEMIDE 10 MG/ML IJ SOLN
40.0000 mg | Freq: Two times a day (BID) | INTRAMUSCULAR | Status: DC
  Administered 2015-07-02 – 2015-07-04 (×5): 40 mg via INTRAVENOUS
  Filled 2015-07-02 (×5): qty 4

## 2015-07-02 MED ORDER — PANTOPRAZOLE SODIUM 40 MG PO TBEC
40.0000 mg | DELAYED_RELEASE_TABLET | Freq: Every day | ORAL | Status: DC
  Administered 2015-07-02 – 2015-07-17 (×16): 40 mg via ORAL
  Filled 2015-07-02 (×17): qty 1

## 2015-07-02 MED ORDER — HYDROCERIN EX CREA
1.0000 "application " | TOPICAL_CREAM | Freq: Three times a day (TID) | CUTANEOUS | Status: DC | PRN
  Filled 2015-07-02: qty 113

## 2015-07-02 MED ORDER — ONDANSETRON HCL 4 MG/2ML IJ SOLN
4.0000 mg | Freq: Once | INTRAMUSCULAR | Status: AC
  Administered 2015-07-02: 4 mg via INTRAVENOUS
  Filled 2015-07-02: qty 2

## 2015-07-02 MED ORDER — SODIUM CHLORIDE 0.9 % IV SOLN: 250.0000 mL | INTRAVENOUS | Status: DC | PRN

## 2015-07-02 MED ORDER — RIFAXIMIN 550 MG PO TABS
550.0000 mg | ORAL_TABLET | Freq: Two times a day (BID) | ORAL | Status: DC
  Administered 2015-07-02 – 2015-07-17 (×31): 550 mg via ORAL
  Filled 2015-07-02 (×31): qty 1

## 2015-07-02 MED ORDER — SENNOSIDES-DOCUSATE SODIUM 8.6-50 MG PO TABS: 1.0000 | ORAL_TABLET | Freq: Every evening | ORAL | Status: DC | PRN

## 2015-07-02 MED ORDER — ALBUTEROL SULFATE HFA 108 (90 BASE) MCG/ACT IN AERS: 2.0000 | INHALATION_SPRAY | RESPIRATORY_TRACT | Status: DC | PRN

## 2015-07-02 MED ORDER — INSULIN GLARGINE 100 UNIT/ML ~~LOC~~ SOLN
10.0000 [IU] | Freq: Every day | SUBCUTANEOUS | Status: DC
  Administered 2015-07-02 – 2015-07-16 (×15): 10 [IU] via SUBCUTANEOUS
  Filled 2015-07-02 (×16): qty 0.1

## 2015-07-02 MED ORDER — INSULIN GLARGINE 100 UNIT/ML SOLOSTAR PEN: 10.0000 [IU] | PEN_INJECTOR | Freq: Every day | SUBCUTANEOUS | Status: DC

## 2015-07-02 MED ORDER — ALBUTEROL SULFATE (2.5 MG/3ML) 0.083% IN NEBU
2.5000 mg | INHALATION_SOLUTION | RESPIRATORY_TRACT | Status: DC | PRN
  Administered 2015-07-15 – 2015-07-16 (×2): 2.5 mg via RESPIRATORY_TRACT
  Filled 2015-07-02 (×2): qty 3

## 2015-07-02 MED ORDER — LACTULOSE 10 GM/15ML PO SOLN
10.0000 g | Freq: Three times a day (TID) | ORAL | Status: DC
  Administered 2015-07-02 – 2015-07-05 (×9): 10 g via ORAL
  Filled 2015-07-02 (×9): qty 30

## 2015-07-02 MED ORDER — FUROSEMIDE 10 MG/ML IJ SOLN
40.0000 mg | Freq: Once | INTRAMUSCULAR | Status: AC
  Administered 2015-07-02: 40 mg via INTRAVENOUS
  Filled 2015-07-02: qty 4

## 2015-07-02 MED ORDER — ONDANSETRON HCL 4 MG PO TABS: 4.0000 mg | ORAL_TABLET | Freq: Four times a day (QID) | ORAL | Status: DC | PRN

## 2015-07-02 MED ORDER — CYCLOBENZAPRINE HCL 10 MG PO TABS
25.0000 mg | ORAL_TABLET | Freq: Every day | ORAL | Status: DC | PRN
  Administered 2015-07-06 – 2015-07-10 (×5): 25 mg via ORAL
  Filled 2015-07-02 (×5): qty 3

## 2015-07-02 NOTE — ED Notes (Signed)
Report given to Romie Minus, RN for room 320

## 2015-07-02 NOTE — ED Notes (Signed)
Pt here for evaluation of edema in scrotum and both legs. Pt was discharged Thursday for same but states it is much worse today

## 2015-07-02 NOTE — H&P (Signed)
[facility]          History and Physical    PCP:   [name], MD   EDP: Lajean Saver, M.D.  Chief Complaint:  Scrotal edema and pain  HPI: Patient is a 54 year old man with multiple medical comorbidities well known for a recent long hospitalization for what was presumed to be a right lower extremity cellulitis for which he received 15 days of IV vancomycin. 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 He states that his anasarca and specifically his scrotal edema has gotten much worse in the past 6 days since discharge. It has gotten to the place where the underside of his scrotum is excoriated he was also having difficulty urinating due to the degree of edema. Because of this he comes to the hospital for evaluation. Renal function remains elevated from baseline, however slightly decreased from discharge at 2.2 to, a very decreased albumin at 2.2. We have been asked to admit him for further evaluation and management.  Allergies:   Allergies  Allergen Reactions  . Penicillins Hives    Has patient had a PCN reaction causing immediate rash, facial/tongue/throat swelling, SOB or lightheadedness with hypotension: Yes Has patient had a PCN reaction causing severe rash involving mucus membranes or skin necrosis: Yes Has patient had a PCN reaction that required hospitalization No Has patient had a PCN reaction occurring within the last 10 years: No If all of the above answers are "NO", then may proceed with Cephalosporin use.       Past Medical History  Diagnosis Date  . Hypertension   . Diabetes mellitus   . GERD (gastroesophageal reflux disease)     DEC 2010 EGD/Bx REACTIVE GASTROPATHY, NO VARICES  . Hemorrhoids, internal   . BMI 40.0-44.9, adult ([place]) OCT 2010 269 LBS    APR 2012 279 LBS AUG 2014 185 LBS  . Cirrhosis ([place]) NOV 2010 CHILD PUGH A    ETOH/HCV/OBESITY  . IV drug  abuse REMOTE  . Hepatitis 2010 HEP C    AST 509 ALT 267 ALK PHOS 165 ALB 3.8 NEG IGM HAV/HBSAg  . Gallstone AUG 2012 1 CM  . GERD 10/25/2009  . COPD (chronic obstructive pulmonary disease) ([place])   . Hepatitis C   . Pancytopenia ([place]) 2013  . Splenomegaly 2013  . Other pancytopenia ([place]) 02/18/2013  . Iron (Fe) deficiency anemia 02/18/2013  . Splenomegaly 02/18/2013  . Neuropathy ([place])   . Biliary dyskinesia JUL 2015    HIDA GB EF 5%  . Chronic pain   . Chronic leg pain     left  . Portal venous hypertension (HCC)   . Asthma   . Medically noncompliant 05/30/2015    Past Surgical History  Procedure Laterality Date  . Sigmoidoscopy      2001 DR. FLEISCHMAN INTERNAL HERMORRHOIDS  . Upper gastrointestinal endoscopy  DEC 2010    BENIGN POLYPS, GASTRITIS, ?phg  . Knee surgery  RIGHT  . Hemorrhoid surgery    . Esophageal biopsy  09/08/2011    [name]:Moderate gastritis/Polyps, multiple in the body of the stomach  . Colonoscopy with propofol N/A 11/15/2013    [name]: rectal varices, small AVMs  . Esophagogastroduodenoscopy (egd) with propofol N/A 11/15/2013    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 Next EGD  in April 2016.      Prior to Admission medications   Medication Sig Start Date End Date Taking? Authorizing Provider  acetaminophen (TYLENOL) 500 MG tablet Take 500 mg by mouth every 6 (six) hours as needed for moderate pain.   Yes Historical Provider, MD  albuterol (PROVENTIL HFA;VENTOLIN HFA) 108 (90 BASE) MCG/ACT inhaler Inhale 2 puffs into the lungs every 6 (six) hours as needed. Shortness of breath 08/14/14  Yes [name], MD  cyclobenzaprine (FLEXERIL) 5 MG tablet Take 25 mg by mouth daily as needed for muscle spasms.  01/25/15  Yes Historical Provider, MD  fluconazole (DIFLUCAN) 100 MG tablet Take 1 tablet (100 mg total) by mouth daily. 06/26/15  Yes Erline Hau, MD  furosemide (LASIX) 20  MG tablet Take 1 tablet (20 mg total) by mouth at bedtime. 05/15/15  Yes [name], MD  glipiZIDE (GLUCOTROL) 10 MG tablet TAKE 1 TABLET (10 MG TOTAL) BY MOUTH DAILY. 06/04/15  Yes [name], MD  hydrocerin (EUCERIN) CREA Apply 1 application topically 3 (three) times daily as needed (for itching/dry skin). 06/26/15  Yes Erline Hau, MD  hydrOXYzine (ATARAX/VISTARIL) 10 MG tablet TAKE 1 TABLET (10 MG TOTAL) BY MOUTH EVERY 4 (FOUR) HOURS AS NEEDED FOR ITCHING. 05/03/15  Yes [name], MD  Insulin Glargine (LANTUS SOLOSTAR) 100 UNIT/ML Solostar Pen Inject 10 Units into the skin at bedtime.   Yes Historical Provider, MD  lactulose (CHRONULAC) 10 GM/15ML solution TAKE 15 MLS (10 G TOTAL) BY MOUTH 3 (THREE) TIMES DAILY. 06/08/15  Yes [name], MD  omeprazole (PRILOSEC) 20 MG capsule Take 2 capsules (40 mg total) by mouth daily. 10/16/14  Yes [name], MD  Oxycodone HCl 10 MG TABS Take 1 tab every 4 hours for pain 06/26/15  Yes Estela Leonie Green, MD  rifaximin (XIFAXAN) 550 MG TABS tablet Take 1 tablet (550 mg total) by mouth 2 (two) times daily. 12/19/14  Yes [name], MD  spironolactone (ALDACTONE) 25 MG tablet TAKE ON MONDAY-WEDNESDAY-FRIDAY Patient taking differently: TAKE ONE TABLET BY MOUTH ONCE DAILY 01/22/15  Yes [name], MD    Social History:  reports that he has been smoking Cigarettes.  He has been smoking about 0.50 packs per day. He has never used smokeless tobacco. He reports that he does not drink alcohol or use illicit drugs.  Family History  Problem Relation Age of Onset  . Colon cancer Neg Hx   . Anesthesia problems Neg Hx   . Hypotension Neg Hx   . Malignant hyperthermia Neg Hx   . Pseudochol deficiency Neg Hx   . Cancer Father     Review of Systems:  Constitutional: Denies fever, chills, diaphoresis, appetite change and fatigue.  HEENT: Denies photophobia, eye pain, redness, hearing loss, ear pain,  congestion, sore throat, rhinorrhea, sneezing, mouth sores, trouble swallowing, neck pain, neck stiffness and tinnitus.   Respiratory: Denies SOB, DOE, cough, chest tightness,  and wheezing.   Cardiovascular: Denies chest pain, palpitations and leg swelling.  Gastrointestinal: Denies nausea, vomiting, abdominal pain, diarrhea, constipation, blood in stool and abdominal distention.  Genitourinary: Denies dysuria, urgency, frequency, hematuria..  Endocrine: Denies: hot or cold intolerance, sweats, changes in hair or nails, polyuria, polydipsia. Musculoskeletal: Denies myalgias, back pain, joint swelling, arthralgias and gait problem.  Skin: Denies pallor, rash and wound.  Neurological: Denies dizziness, seizures, syncope, weakness, light-headedness, numbness and headaches.  Hematological: Denies adenopathy. Easy bruising, personal or  family bleeding history  Psychiatric/Behavioral: Denies suicidal ideation, mood changes, confusion, nervousness, sleep disturbance and agitation   Physical Exam: Blood pressure 145/77, pulse 99, temperature 98 F (36.7 C), temperature source Oral, resp. rate 18, height _0  (1.651 m), weight 150.141 kg (331 lb), SpO2 97 %. General: Alert, awake, oriented 3 HEENT: Normocephalic, atraumatic, pupils equal round react to light, extraocular movements intact, moist mucous membranes Neck: Supple, no JVD, no lymphadenopathy, no bruits, no goiter. Cardiovascular: Regular rate and rhythm, no murmurs, rubs or gallops Lungs: Clear to auscultation bilaterally Abdomen: Obese, soft, distended, hypoactive bowel sounds Extremities: 3-4+ pedal edema bilaterally, still with redness, vesicular lesions to the anterior aspect of his right lower leg, positive pulses Genitourinary: Massive scrotal edema Neurologic: Grossly intact and nonfocal, I have witnessed him ambulating to the restroom.  Labs on Admission:  Results for orders placed or performed during the hospital encounter of  07/02/15 (from the past 48 hour(s))  Comprehensive metabolic panel     Status: Abnormal   Collection Time: 07/02/15 10:05 AM  Result Value Ref Range   Sodium 138 135 - 145 mmol/L   Potassium 4.9 3.5 - 5.1 mmol/L   Chloride 106 101 - 111 mmol/L   CO2 26 22 - 32 mmol/L   Glucose, Bld 129 (H) 65 - 99 mg/dL   BUN 39 (H) 6 - 20 mg/dL   Creatinine, Ser 2.22 (H) 0.61 - 1.24 mg/dL   Calcium 8.2 (L) 8.9 - 10.3 mg/dL   Total Protein 6.0 (L) 6.5 - 8.1 g/dL   Albumin 2.2 (L) 3.5 - 5.0 g/dL   AST 226 (H) 15 - 41 U/L   ALT 71 (H) 17 - 63 U/L   Alkaline Phosphatase 120 38 - 126 U/L   Total Bilirubin 1.8 (H) 0.3 - 1.2 mg/dL   GFR calc non Af Amer 32 (L) >60 mL/min   GFR calc Af Amer 37 (L) >60 mL/min    Comment: (NOTE) The eGFR has been calculated using the CKD EPI equation. This calculation has not been validated in all clinical situations. eGFR's persistently <60 mL/min signify possible Chronic Kidney Disease.    Anion gap 6 5 - 15  CBC with Differential/Platelet     Status: Abnormal   Collection Time: 07/02/15 10:05 AM  Result Value Ref Range   WBC 3.7 (L) 4.0 - 10.5 K/uL   RBC 3.19 (L) 4.22 - 5.81 MIL/uL   Hemoglobin 10.4 (L) 13.0 - 17.0 g/dL   HCT 30.8 (L) 39.0 - 52.0 %   MCV 96.6 78.0 - 100.0 fL   MCH 32.6 26.0 - 34.0 pg   MCHC 33.8 30.0 - 36.0 g/dL   RDW 14.0 11.5 - 15.5 %   Platelets 70 (L) 150 - 400 K/uL    Comment: SPECIMEN CHECKED FOR CLOTS PLATELET COUNT CONFIRMED BY SMEAR    Neutrophils Relative % 50 %   Neutro Abs 1.9 1.7 - 7.7 K/uL   Lymphocytes Relative 25 %   Lymphs Abs 0.9 0.7 - 4.0 K/uL   Monocytes Relative 22 %   Monocytes Absolute 0.8 0.1 - 1.0 K/uL   Eosinophils Relative 3 %   Eosinophils Absolute 0.1 0.0 - 0.7 K/uL   Basophils Relative 0 %   Basophils Absolute 0.0 0.0 - 0.1 K/uL  Protime-INR     Status: Abnormal   Collection Time: 07/02/15 10:05 AM  Result Value Ref Range   Prothrombin Time 17.4 (H) 11.6 - 15.2 seconds   INR 1.41 0.00 - 1.49  Magnesium      Status: None   Collection Time: 07/02/15 10:05 AM  Result Value Ref Range   Magnesium 1.7 1.7 - 2.4 mg/dL  Ammonia     Status: Abnormal   Collection Time: 07/02/15 10:05 AM  Result Value Ref Range   Ammonia 55 (H) 9 - 35 umol/L  Urinalysis, Routine w reflex microscopic (not at [facility])     Status: Abnormal   Collection Time: 07/02/15 11:20 AM  Result Value Ref Range   Color, Urine YELLOW YELLOW   APPearance CLEAR CLEAR   Specific Gravity, Urine 1.010 1.005 - 1.030   pH 6.0 5.0 - 8.0   Glucose, UA NEGATIVE NEGATIVE mg/dL   Hgb urine dipstick LARGE (A) NEGATIVE   Bilirubin Urine NEGATIVE NEGATIVE   Ketones, ur NEGATIVE NEGATIVE mg/dL   Protein, ur TRACE (A) NEGATIVE mg/dL   Nitrite NEGATIVE NEGATIVE   Leukocytes, UA SMALL (A) NEGATIVE  Urine microscopic-add on     Status: None   Collection Time: 07/02/15 11:20 AM  Result Value Ref Range   Squamous Epithelial / LPF NONE SEEN NONE SEEN    Comment: Please note change in reference range.   WBC, UA 6-30 0 - 5 WBC/hpf    Comment: Please note change in reference range.   RBC / HPF TOO NUMEROUS TO COUNT 0 - 5 RBC/hpf    Comment: Please note change in reference range.   Bacteria, UA NONE SEEN NONE SEEN    Comment: Please note change in reference range.    Radiological Exams on Admission: Dg Chest 2 View  07/02/2015  CLINICAL DATA:  Short of breath EXAM: CHEST  2 VIEW COMPARISON:  06/11/2015 FINDINGS: Normal heart size. Semi recumbent AP view of the lungs with low volumes. Grossly clear. No pneumothorax. No pleural effusion. No obvious pneumothorax. IMPRESSION: Low volumes.  No active cardiopulmonary disease. Electronically Signed   By: Marybelle Killings M.D.   On: 07/02/2015 11:39    Assessment/Plan Principal Problem:   Anasarca Active Problems:   Hepatic cirrhosis (HCC)   Scrotal edema   Diabetes (HCC)   Chronic hepatitis C with cirrhosis (HCC)   Thrombocytopenia (HCC)   Morbid obesity (HCC)    Anasarca/EtOH and hepatitis C  induced cirrhosis -Mainly due to decreased oncotic pressure and hypoalbuminemia in the face of cirrhosis and what is likely end-stage liver disease. -We'll admit to the hospital for IV diuresis, will place on Lasix 40 mg IV twice daily, will check strict I's and O's and daily weights. -I do not see when in our system a 2-D echocardiogram has ever been done so will order one to assess ejection fraction and diastolic function. -We'll continue Aldactone. -I discussed expectations with patient and his wife and that we will never be able to completely eliminate his anasarca and that the role for admission is to simply somewhat improve it. -We'll request GI to follow-up in the hospital for further recommendations. He had a scheduled outpatient appointment with them tomorrow on 11/22.  Right lower extremity wound -Received a thorough treatment with antibiotics during prior admission with 15 days of IV vancomycin. -Leg does appear somewhat improved, I do not believe this represents cellulitis at this point. -We'll continue dressing changes in the hospital as he was receiving at home via home health, will need to follow-up with wound clinic upon discharge.  Diabetes mellitus, type II -Check hemoglobin A1c. -Placed on a moderate sliding scale while in the hospital.  Acute on chronic kidney disease at  least stage III -It appears baseline creatinine is around 1.4. -Was discharged on 11/10 with a creatinine of 2.3, this is now down to 2.2 upon admission. -We will need to monitor closely with ongoing diuresis. -Consider nephrology consultation if creatinine continues to worsen.  Scrotal edema -Due to anasarca from decreased oncotic pressure and cirrhosis. -Foley catheter has been placed due to difficulty urinating.  Thrombocytopenia -Manifestation of his cirrhosis. -Monitor, avoid heparin products.  DVT prophylaxis -SCDs  CODE STATUS -Currently full code. -May benefit from palliative care  consultation this admission to further delineate code status and future goals of care.   Time Spent on Admission: 95 minutes  HERNANDEZ ACOSTA,ESTELA [facility] Pager: [phone] 07/02/2015, 2:04 PM

## 2015-07-02 NOTE — ED Provider Notes (Addendum)
CSN: [id number]     Arrival date & time 07/02/15  0935 History  By signing my name below, I, Erling Conte, attest that this documentation has been prepared under the direction and in the presence of Lajean Saver, MD. Electronically Signed: Erling Conte, ED Scribe. 07/02/2015. 9:54 AM.    Chief Complaint  Patient presents with  . Groin Swelling    pt was discharged Thursday for same  . Leg Swelling    The history is provided by the patient and the spouse. No language interpreter was used.    HPI Comments: [name] is a 54 y.o. male with a h/o DM, COPD, and alcoholic cirrhosis with varices, who presents to the Emergency Department complaining of persistent, severe swelling in both legs and right groin for 3 weeks. +sob.  Pt was d/c 5 days ago from the hospital for the same but states it is much worse today. He notes he has been taking his fluid pills as prescribed. Normal urine output. Pt's wife notes he finished his abx by time he was d/c from the hospital. Pt notes he has a h/o celulitis in his right leg. He is a former alcoholic but is no longer drinking at this time. Pt reports he never had significant scrotal swelling prior to the onset of these symptoms 1 month ago. He denies any SOB, chest pain, fever, abdominal pain, or urinary symptoms.  Pt/spouse also note persistence of the erythema to right lower leg, ?worsening, and indicating previously bilateral lower legs appeared same color.   Past Medical History  Diagnosis Date  . Hypertension   . Diabetes mellitus   . GERD (gastroesophageal reflux disease)     DEC 2010 EGD/Bx REACTIVE GASTROPATHY, NO VARICES  . Hemorrhoids, internal   . BMI 40.0-44.9, adult ([place]) OCT 2010 269 LBS    APR 2012 279 LBS AUG 2014 185 LBS  . Cirrhosis ([place]) NOV 2010 CHILD PUGH A    ETOH/HCV/OBESITY  . IV drug abuse REMOTE  . Hepatitis 2010 HEP C    AST 509 ALT 267 ALK PHOS 165 ALB 3.8 NEG IGM HAV/HBSAg  . Gallstone AUG 2012 1 CM  . GERD  10/25/2009  . COPD (chronic obstructive pulmonary disease) ([place])   . Hepatitis C   . Pancytopenia ([place]) 2013  . Splenomegaly 2013  . Other pancytopenia ([place]) 02/18/2013  . Iron (Fe) deficiency anemia 02/18/2013  . Splenomegaly 02/18/2013  . Neuropathy ([place])   . Biliary dyskinesia JUL 2015    HIDA GB EF 5%  . Chronic pain   . Chronic leg pain     left  . Portal venous hypertension (HCC)   . Asthma   . Medically noncompliant 05/30/2015   Past Surgical History  Procedure Laterality Date  . Sigmoidoscopy      2001 DR. FLEISCHMAN INTERNAL HERMORRHOIDS  . Upper gastrointestinal endoscopy  DEC 2010    BENIGN POLYPS, GASTRITIS, ?phg  . Knee surgery  RIGHT  . Hemorrhoid surgery    . Esophageal biopsy  09/08/2011    [name]:Moderate gastritis/Polyps, multiple in the body of the stomach  . Colonoscopy with propofol N/A 11/15/2013    [name]: rectal varices, small AVMs  . Esophagogastroduodenoscopy (egd) with propofol N/A 11/15/2013    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 Next EGD in April 2016.     Family History  Problem Relation Age of Onset  . Colon cancer Neg Hx   .  Anesthesia problems Neg Hx   . Hypotension Neg Hx   . Malignant hyperthermia Neg Hx   . Pseudochol deficiency Neg Hx   . Cancer Father    Social History  Substance Use Topics  . Smoking status: Current Some Day Smoker -- 0.50 packs/day    Types: Cigarettes  . Smokeless tobacco: Never Used  . Alcohol Use: No     Comment: 30 years ago    Review of Systems  Constitutional: Negative for fever.  HENT: Negative for congestion and sore throat.   Eyes: Negative for redness.  Respiratory: Positive for shortness of breath. Negative for cough.   Cardiovascular: Positive for leg swelling. Negative for chest pain.  Gastrointestinal: Negative for abdominal pain.  Genitourinary: Positive for scrotal swelling. Negative for flank pain.  Musculoskeletal:  Negative for back pain and neck pain.  Skin: Negative for rash.  Neurological: Positive for weakness. Negative for headaches.  Hematological: Does not bruise/bleed easily.  Psychiatric/Behavioral: Negative for confusion.  All other systems reviewed and are negative.     Allergies  Penicillins  Home Medications   Prior to Admission medications   Medication Sig Start Date End Date Taking? Authorizing Provider  albuterol (PROVENTIL HFA;VENTOLIN HFA) 108 (90 BASE) MCG/ACT inhaler Inhale 2 puffs into the lungs every 6 (six) hours as needed. Shortness of breath 08/14/14   [name], MD  cyclobenzaprine (FLEXERIL) 5 MG tablet Take 25 mg by mouth daily as needed for muscle spasms.  01/25/15   Historical Provider, MD  fluconazole (DIFLUCAN) 100 MG tablet Take 1 tablet (100 mg total) by mouth daily. 06/26/15   Erline Hau, MD  furosemide (LASIX) 20 MG tablet Take 1 tablet (20 mg total) by mouth at bedtime. 05/15/15   [name], MD  glipiZIDE (GLUCOTROL) 10 MG tablet TAKE 1 TABLET (10 MG TOTAL) BY MOUTH DAILY. 06/04/15   [name], MD  hydrocerin (EUCERIN) CREA Apply 1 application topically 3 (three) times daily as needed (for itching/dry skin). 06/26/15   Erline Hau, MD  hydrOXYzine (ATARAX/VISTARIL) 10 MG tablet TAKE 1 TABLET (10 MG TOTAL) BY MOUTH EVERY 4 (FOUR) HOURS AS NEEDED FOR ITCHING. 05/03/15   [name], MD  Insulin Glargine (LANTUS SOLOSTAR) 100 UNIT/ML Solostar Pen Inject 10 Units into the skin at bedtime.    Historical Provider, MD  lactulose (CHRONULAC) 10 GM/15ML solution TAKE 15 MLS (10 G TOTAL) BY MOUTH 3 (THREE) TIMES DAILY. 06/08/15   [name], MD  omeprazole (PRILOSEC) 20 MG capsule Take 2 capsules (40 mg total) by mouth daily. 10/16/14   [name], MD  Oxycodone HCl 10 MG TABS Take 1 tab every 4 hours for pain 06/26/15   Erline Hau, MD  rifaximin (XIFAXAN) 550 MG TABS tablet Take 1 tablet (550 mg  total) by mouth 2 (two) times daily. 12/19/14   [name], MD  spironolactone (ALDACTONE) 25 MG tablet TAKE ON MONDAY-WEDNESDAY-FRIDAY Patient taking differently: TAKE ONE TABLET BY MOUTH ONCE DAILY 01/22/15   [name], MD   Triage Vitals: BP 151/63 mmHg  Pulse 81  Temp(Src) 98 F (36.7 C) (Oral)  Resp 20  Ht $R'5\' 5"'YG$  (1.651 m)  Wt 331 lb (150.141 kg)  BMI 55.08 kg/m2  SpO2 100%  Physical Exam  Constitutional: He is oriented to person, place, and time. He appears well-developed and well-nourished. No distress.  HENT:  Head: Normocephalic and atraumatic.  Eyes: Conjunctivae and EOM are normal.  No scleral icterus.  Neck: Neck supple. No tracheal deviation present.  Cardiovascular: Normal rate, regular rhythm, normal heart sounds and intact distal pulses.  Exam reveals no gallop and no friction rub.   No murmur heard. Pulmonary/Chest: Effort normal. No respiratory distress.  Abdominal: Soft. There is no tenderness.  Obese. Mild distension. Non tender.   Musculoskeletal: Normal range of motion. He exhibits edema.  severe scrotal and bilateral leg edema, his right lower leg is erythematous/tender. Distal pulses palp.   Neurological: He is alert and oriented to person, place, and time. No cranial nerve deficit.  Skin: Skin is warm and dry. No rash noted. There is erythema.  Psychiatric: He has a normal mood and affect. His behavior is normal.  Nursing note and vitals reviewed.   ED Course  Procedures (including critical care time) Labs Review  Results for orders placed or performed during the hospital encounter of 07/02/15  Comprehensive metabolic panel  Result Value Ref Range   Sodium 138 135 - 145 mmol/L   Potassium 4.9 3.5 - 5.1 mmol/L   Chloride 106 101 - 111 mmol/L   CO2 26 22 - 32 mmol/L   Glucose, Bld 129 (H) 65 - 99 mg/dL   BUN 39 (H) 6 - 20 mg/dL   Creatinine, Ser 2.22 (H) 0.61 - 1.24 mg/dL   Calcium 8.2 (L) 8.9 - 10.3 mg/dL   Total Protein 6.0 (L) 6.5 -  8.1 g/dL   Albumin 2.2 (L) 3.5 - 5.0 g/dL   AST 226 (H) 15 - 41 U/L   ALT 71 (H) 17 - 63 U/L   Alkaline Phosphatase 120 38 - 126 U/L   Total Bilirubin 1.8 (H) 0.3 - 1.2 mg/dL   GFR calc non Af Amer 32 (L) >60 mL/min   GFR calc Af Amer 37 (L) >60 mL/min   Anion gap 6 5 - 15  Protime-INR  Result Value Ref Range   Prothrombin Time 17.4 (H) 11.6 - 15.2 seconds   INR 1.41 0.00 - 1.49  Magnesium  Result Value Ref Range   Magnesium 1.7 1.7 - 2.4 mg/dL  Ammonia  Result Value Ref Range   Ammonia 55 (H) 9 - 35 umol/L      I have personally reviewed and evaluated these images and lab results as part of my medical decision-making.   MDM   I personally performed the services described in this documentation, which was scribed in my presence. The recorded information has been reviewed and considered. Lajean Saver, MD   Iv ns.   Pt requests pain med.  Morphine iv.  Pt w erythema/tenderness to right lower leg, ?cellulitis not improved/resolved w recent tx - will give vanc iv.   Given severe scrotal and leg edema, persistent cellulitis, sob, inability to ambulate, will admit to med service.  Lasix iv.  Reviewed nursing notes and prior charts for additional history.   On looking at pt/chart 3 weeks ago - creatinine was normal, .9, he had much much milder scrotal and leg edema, RLE is more erythematous/worse today - since then pt w marked functional decline, worsening edema, renal fxn now 2.2 over double what was 3 weeks ago - feel pt requires admission for tx RLE process, AKI (as compared w 3 weeks ago), and medication management re severe scrotal and lower exte edema - may also benefit from inpatient gi and ID consult.   Discussed pt with Hospitalist - they will see in ED/possible admit down to [facility].  Lajean Saver, MD 07/02/15 [phone]

## 2015-07-02 NOTE — Progress Notes (Signed)
ANTIBIOTIC CONSULT NOTE - INITIAL  Pharmacy Consult for vancomycin Indication: cellulitis  Allergies  Allergen Reactions  . Penicillins Hives    Has patient had a PCN reaction causing immediate rash, facial/tongue/throat swelling, SOB or lightheadedness with hypotension: Yes Has patient had a PCN reaction causing severe rash involving mucus membranes or skin necrosis: Yes Has patient had a PCN reaction that required hospitalization No Has patient had a PCN reaction occurring within the last 10 years: No If all of the above answers are "NO", then may proceed with Cephalosporin use.     Patient Measurements: Height: 5' 5" (165.1 cm) Weight: (!) 331 lb (150.141 kg) IBW/kg (Calculated) : 61.5  Vital Signs: Temp: 98 F (36.7 C) (11/21 0935) Temp Source: Oral (11/21 0935) BP: 145/77 mmHg (11/21 1100) Pulse Rate: 81 (11/21 1200) Intake/Output from previous day:   Intake/Output from this shift:    Labs:  Recent Labs  07/02/15 1005  WBC 3.7*  HGB 10.4*  PLT 70*  CREATININE 2.22*   Estimated Creatinine Clearance: 52.7 mL/min (by C-G formula based on Cr of 2.22). No results for input(s): VANCOTROUGH, VANCOPEAK, VANCORANDOM, GENTTROUGH, GENTPEAK, GENTRANDOM, TOBRATROUGH, TOBRAPEAK, TOBRARND, AMIKACINPEAK, AMIKACINTROU, AMIKACIN in the last 72 hours.   Microbiology: Recent Results (from the past 720 hour(s))  Blood culture (routine x 2)     Status: None   Collection Time: 06/11/15  3:28 PM  Result Value Ref Range Status   Specimen Description BLOOD LEFT HAND  Final   Special Requests BOTTLES DRAWN AEROBIC ONLY [place]  Final   Culture NO GROWTH 5 DAYS  Final   Report Status 06/16/2015 FINAL  Final  Blood culture (routine x 2)     Status: None   Collection Time: 06/11/15  3:47 PM  Result Value Ref Range Status   Specimen Description BLOOD RIGHT ANTECUBITAL  Final   Special Requests BOTTLES DRAWN AEROBIC AND ANAEROBIC [place]  Final   Culture NO GROWTH 5 DAYS  Final   Report  Status 06/16/2015 FINAL  Final  Urine culture     Status: None   Collection Time: 06/11/15  4:00 PM  Result Value Ref Range Status   Specimen Description URINE, CLEAN CATCH  Final   Special Requests NONE  Final   Culture   Final    MULTIPLE SPECIES PRESENT, SUGGEST RECOLLECTION Performed at [facility]    Report Status 06/13/2015 FINAL  Final  MRSA PCR Screening     Status: None   Collection Time: 06/11/15  7:43 PM  Result Value Ref Range Status   MRSA by PCR NEGATIVE NEGATIVE Final    Comment:        The GeneXpert MRSA Assay (FDA approved for NASAL specimens only), is one component of a comprehensive MRSA colonization surveillance program. It is not intended to diagnose MRSA infection nor to guide or monitor treatment for MRSA infections.     Medical History: Past Medical History  Diagnosis Date  . Hypertension   . Diabetes mellitus   . GERD (gastroesophageal reflux disease)     DEC 2010 EGD/Bx REACTIVE GASTROPATHY, NO VARICES  . Hemorrhoids, internal   . BMI 40.0-44.9, adult ([place]) OCT 2010 269 LBS    APR 2012 279 LBS AUG 2014 185 LBS  . Cirrhosis ([place]) NOV 2010 CHILD PUGH A    ETOH/HCV/OBESITY  . IV drug abuse REMOTE  . Hepatitis 2010 HEP C    AST 509 ALT 267 ALK PHOS 165 ALB 3.8 NEG IGM HAV/HBSAg  .  Gallstone AUG 2012 1 CM  . GERD 10/25/2009  . COPD (chronic obstructive pulmonary disease) ([place])   . Hepatitis C   . Pancytopenia ([place]) 2013  . Splenomegaly 2013  . Other pancytopenia ([place]) 02/18/2013  . Iron (Fe) deficiency anemia 02/18/2013  . Splenomegaly 02/18/2013  . Neuropathy ([place])   . Biliary dyskinesia JUL 2015    HIDA GB EF 5%  . Chronic pain   . Chronic leg pain     left  . Portal venous hypertension (HCC)   . Asthma   . Medically noncompliant 05/30/2015    Medications:  See medication history Assessment: 54 yo man known to pharmacy from previous vancomycin dosing.  He came to the ED for worsening cellulitis.  His SrCr is elevated today  at 2.22.  Goal of Therapy:  Vancomycin trough level 10-15 mcg/ml  Plan:  Vancomycin 2500 mg IV X 1 then 1250 mg IV q24 hours Will f/u renal function, cultures and clinical course  Thanks for allowing pharmacy to be a part of this patient's care.  Excell Seltzer, PharmD Clinical Pharmacist  07/02/2015,12:34 PM

## 2015-07-03 ENCOUNTER — Telehealth: Payer: Self-pay | Admitting: Gastroenterology

## 2015-07-03 ENCOUNTER — Ambulatory Visit: Payer: Medicare Other | Admitting: Nurse Practitioner

## 2015-07-03 ENCOUNTER — Other Ambulatory Visit: Payer: Self-pay | Admitting: Family Medicine

## 2015-07-03 ENCOUNTER — Observation Stay (HOSPITAL_BASED_OUTPATIENT_CLINIC_OR_DEPARTMENT_OTHER): Payer: Medicare Other

## 2015-07-03 DIAGNOSIS — E1122 Type 2 diabetes mellitus with diabetic chronic kidney disease: Secondary | ICD-10-CM | POA: Diagnosis present

## 2015-07-03 DIAGNOSIS — F17210 Nicotine dependence, cigarettes, uncomplicated: Secondary | ICD-10-CM | POA: Diagnosis present

## 2015-07-03 DIAGNOSIS — D6959 Other secondary thrombocytopenia: Secondary | ICD-10-CM | POA: Diagnosis present

## 2015-07-03 DIAGNOSIS — F419 Anxiety disorder, unspecified: Secondary | ICD-10-CM | POA: Diagnosis present

## 2015-07-03 DIAGNOSIS — K219 Gastro-esophageal reflux disease without esophagitis: Secondary | ICD-10-CM | POA: Diagnosis present

## 2015-07-03 DIAGNOSIS — I130 Hypertensive heart and chronic kidney disease with heart failure and stage 1 through stage 4 chronic kidney disease, or unspecified chronic kidney disease: Secondary | ICD-10-CM | POA: Diagnosis present

## 2015-07-03 DIAGNOSIS — K766 Portal hypertension: Secondary | ICD-10-CM | POA: Diagnosis present

## 2015-07-03 DIAGNOSIS — E8809 Other disorders of plasma-protein metabolism, not elsewhere classified: Secondary | ICD-10-CM | POA: Diagnosis present

## 2015-07-03 DIAGNOSIS — J441 Chronic obstructive pulmonary disease with (acute) exacerbation: Secondary | ICD-10-CM | POA: Diagnosis present

## 2015-07-03 DIAGNOSIS — I5031 Acute diastolic (congestive) heart failure: Secondary | ICD-10-CM | POA: Diagnosis present

## 2015-07-03 DIAGNOSIS — R601 Generalized edema: Secondary | ICD-10-CM | POA: Diagnosis not present

## 2015-07-03 DIAGNOSIS — K769 Liver disease, unspecified: Secondary | ICD-10-CM | POA: Diagnosis not present

## 2015-07-03 DIAGNOSIS — N183 Chronic kidney disease, stage 3 (moderate): Secondary | ICD-10-CM | POA: Diagnosis present

## 2015-07-03 DIAGNOSIS — Z6841 Body Mass Index (BMI) 40.0 and over, adult: Secondary | ICD-10-CM | POA: Diagnosis not present

## 2015-07-03 DIAGNOSIS — D696 Thrombocytopenia, unspecified: Secondary | ICD-10-CM | POA: Diagnosis not present

## 2015-07-03 DIAGNOSIS — K7030 Alcoholic cirrhosis of liver without ascites: Secondary | ICD-10-CM | POA: Diagnosis not present

## 2015-07-03 DIAGNOSIS — B182 Chronic viral hepatitis C: Secondary | ICD-10-CM | POA: Diagnosis present

## 2015-07-03 DIAGNOSIS — K7460 Unspecified cirrhosis of liver: Secondary | ICD-10-CM | POA: Diagnosis not present

## 2015-07-03 DIAGNOSIS — Z7189 Other specified counseling: Secondary | ICD-10-CM | POA: Diagnosis not present

## 2015-07-03 DIAGNOSIS — K7290 Hepatic failure, unspecified without coma: Secondary | ICD-10-CM | POA: Diagnosis present

## 2015-07-03 DIAGNOSIS — D638 Anemia in other chronic diseases classified elsewhere: Secondary | ICD-10-CM | POA: Diagnosis present

## 2015-07-03 DIAGNOSIS — K7031 Alcoholic cirrhosis of liver with ascites: Secondary | ICD-10-CM | POA: Diagnosis present

## 2015-07-03 DIAGNOSIS — N5089 Other specified disorders of the male genital organs: Secondary | ICD-10-CM | POA: Diagnosis present

## 2015-07-03 DIAGNOSIS — L03115 Cellulitis of right lower limb: Secondary | ICD-10-CM | POA: Diagnosis present

## 2015-07-03 DIAGNOSIS — N179 Acute kidney failure, unspecified: Secondary | ICD-10-CM | POA: Diagnosis present

## 2015-07-03 DIAGNOSIS — Z794 Long term (current) use of insulin: Secondary | ICD-10-CM | POA: Diagnosis not present

## 2015-07-03 DIAGNOSIS — E119 Type 2 diabetes mellitus without complications: Secondary | ICD-10-CM | POA: Diagnosis not present

## 2015-07-03 DIAGNOSIS — R339 Retention of urine, unspecified: Secondary | ICD-10-CM | POA: Diagnosis present

## 2015-07-03 LAB — COMPREHENSIVE METABOLIC PANEL
ALBUMIN: 2.4 g/dL — AB (ref 3.5–5.0)
ALT: 72 U/L — ABNORMAL HIGH (ref 17–63)
AST: 235 U/L — AB (ref 15–41)
Alkaline Phosphatase: 129 U/L — ABNORMAL HIGH (ref 38–126)
Anion gap: 9 (ref 5–15)
BUN: 36 mg/dL — AB (ref 6–20)
CHLORIDE: 104 mmol/L (ref 101–111)
CO2: 26 mmol/L (ref 22–32)
Calcium: 8.6 mg/dL — ABNORMAL LOW (ref 8.9–10.3)
Creatinine, Ser: 2.12 mg/dL — ABNORMAL HIGH (ref 0.61–1.24)
GFR calc Af Amer: 39 mL/min — ABNORMAL LOW (ref >60)
GFR, EST NON AFRICAN AMERICAN: 34 mL/min — AB (ref >60)
GLUCOSE: 80 mg/dL (ref 65–99)
POTASSIUM: 4.1 mmol/L (ref 3.5–5.1)
SODIUM: 139 mmol/L (ref 135–145)
Total Bilirubin: 2.1 mg/dL — ABNORMAL HIGH (ref 0.3–1.2)
Total Protein: 6.7 g/dL (ref 6.5–8.1)

## 2015-07-03 LAB — CBC
HEMATOCRIT: 34.3 % — AB (ref 39.0–52.0)
Hemoglobin: 11.3 g/dL — ABNORMAL LOW (ref 13.0–17.0)
MCH: 31.9 pg (ref 26.0–34.0)
MCHC: 32.9 g/dL (ref 30.0–36.0)
MCV: 96.9 fL (ref 78.0–100.0)
Platelets: 99 10*3/uL — ABNORMAL LOW (ref 150–400)
RBC: 3.54 MIL/uL — ABNORMAL LOW (ref 4.22–5.81)
RDW: 14.8 % (ref 11.5–15.5)
WBC: 9.3 10*3/uL (ref 4.0–10.5)

## 2015-07-03 LAB — GLUCOSE, CAPILLARY
GLUCOSE-CAPILLARY: 111 mg/dL — AB (ref 65–99)
Glucose-Capillary: 83 mg/dL (ref 65–99)
Glucose-Capillary: 147 mg/dL — ABNORMAL HIGH (ref 65–99)
Glucose-Capillary: 117 mg/dL — ABNORMAL HIGH (ref 65–99)

## 2015-07-03 MED ORDER — PERFLUTREN LIPID MICROSPHERE
1.0000 mL | INTRAVENOUS | Status: AC | PRN
  Administered 2015-07-03: 3 mL via INTRAVENOUS
  Filled 2015-07-03: qty 10

## 2015-07-03 NOTE — Consult Note (Signed)
Referring Provider: Dr. Jerilee Hoh Primary Care Physician:  [name], MD Primary Gastroenterologist:  [name]   Date of Admission: 07/02/15 Date of Consultation: 07/03/15  Reason for Consultation:  Hep C and alcoholic cirrhosis with anasarca  HPI:  [name] is a 54 y.o. year old male with a history of Hep C/ETOH cirrhosis, hepatic encephalopathy, failure to achieve sustained virologic response with treatment of Hep C in March 2016, well known to our practice. Recently inpatient with cellulitis of right lower extremity and groin. RLE negative for DVT, with hospitalization complicated by constipation/ileus.   Presented again yesterday due to significant scrotal edema, felt like it was worsening from when last admitted. Feels like it is just slightly improved. Complains of lower leg edema, pain, feels tight. Sore across abdomen. Difficulty ambulating at home. No N/V. BM X1 when first coming to ED. Denies confusion or mental status changes. No GI bleeding. Good appetite. No ETOH. No drug use. Lasix 40 mg IV started on admission along with aldactone 25 mg daily.   Last EGD in May 2015, due for surveillance around this time frame. Urology appt scheduled for Nov 30th. Last seen in our office Oct 2016.   Past Medical History  Diagnosis Date  . Hypertension   . Diabetes mellitus   . GERD (gastroesophageal reflux disease)     DEC 2010 EGD/Bx REACTIVE GASTROPATHY, NO VARICES  . Hemorrhoids, internal   . BMI 40.0-44.9, adult ([place]) OCT 2010 269 LBS    APR 2012 279 LBS AUG 2014 185 LBS  . Cirrhosis ([place]) NOV 2010 CHILD PUGH A    ETOH/HCV/OBESITY  . IV drug abuse REMOTE  . Hepatitis 2010 HEP C    AST 509 ALT 267 ALK PHOS 165 ALB 3.8 NEG IGM HAV/HBSAg  . Gallstone AUG 2012 1 CM  . GERD 10/25/2009  . COPD (chronic obstructive pulmonary disease) ([place])   . Hepatitis C   . Pancytopenia ([place]) 2013  . Splenomegaly 2013  . Other pancytopenia ([place]) 02/18/2013  . Iron (Fe) deficiency  anemia 02/18/2013  . Splenomegaly 02/18/2013  . Neuropathy ([place])   . Biliary dyskinesia JUL 2015    HIDA GB EF 5%  . Chronic pain   . Chronic leg pain     left  . Portal venous hypertension (HCC)   . Asthma   . Medically noncompliant 05/30/2015    Past Surgical History  Procedure Laterality Date  . Sigmoidoscopy      2001 DR. FLEISCHMAN INTERNAL HERMORRHOIDS  . Upper gastrointestinal endoscopy  DEC 2010    BENIGN POLYPS, GASTRITIS, ?phg  . Knee surgery  RIGHT  . Hemorrhoid surgery    . Esophageal biopsy  09/08/2011    [name]:Moderate gastritis/Polyps, multiple in the body of the stomach  . Colonoscopy with propofol N/A 11/15/2013    [name]: rectal varices, small AVMs  . Esophagogastroduodenoscopy (egd) with propofol N/A 11/15/2013    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 Next EGD in April 2016.      Prior to Admission medications   Medication Sig Start Date End Date Taking? Authorizing Provider  acetaminophen (TYLENOL) 500 MG tablet Take 500 mg by mouth every 6 (six) hours as needed for moderate pain.   Yes Historical Provider, MD  albuterol (PROVENTIL HFA;VENTOLIN HFA) 108 (90 BASE) MCG/ACT inhaler Inhale 2 puffs into the lungs every 6 (six) hours as needed. Shortness of breath 08/14/14  Yes [name],  MD  cyclobenzaprine (FLEXERIL) 5 MG tablet Take 25 mg by mouth daily as needed for muscle spasms.  01/25/15  Yes Historical Provider, MD  fluconazole (DIFLUCAN) 100 MG tablet Take 1 tablet (100 mg total) by mouth daily. 06/26/15  Yes Erline Hau, MD  furosemide (LASIX) 20 MG tablet Take 1 tablet (20 mg total) by mouth at bedtime. 05/15/15  Yes [name], MD  glipiZIDE (GLUCOTROL) 10 MG tablet TAKE 1 TABLET (10 MG TOTAL) BY MOUTH DAILY. 06/04/15  Yes [name], MD  hydrocerin (EUCERIN) CREA Apply 1 application topically 3 (three) times daily as needed (for itching/dry skin). 06/26/15   Yes Erline Hau, MD  hydrOXYzine (ATARAX/VISTARIL) 10 MG tablet TAKE 1 TABLET (10 MG TOTAL) BY MOUTH EVERY 4 (FOUR) HOURS AS NEEDED FOR ITCHING. 05/03/15  Yes [name], MD  Insulin Glargine (LANTUS SOLOSTAR) 100 UNIT/ML Solostar Pen Inject 10 Units into the skin at bedtime.   Yes Historical Provider, MD  lactulose (CHRONULAC) 10 GM/15ML solution TAKE 15 MLS (10 G TOTAL) BY MOUTH 3 (THREE) TIMES DAILY. 06/08/15  Yes [name], MD  omeprazole (PRILOSEC) 20 MG capsule Take 2 capsules (40 mg total) by mouth daily. 10/16/14  Yes [name], MD  Oxycodone HCl 10 MG TABS Take 1 tab every 4 hours for pain 06/26/15  Yes Estela Leonie Green, MD  rifaximin (XIFAXAN) 550 MG TABS tablet Take 1 tablet (550 mg total) by mouth 2 (two) times daily. 12/19/14  Yes [name], MD  spironolactone (ALDACTONE) 25 MG tablet TAKE ON MONDAY-WEDNESDAY-FRIDAY Patient taking differently: TAKE ONE TABLET BY MOUTH ONCE DAILY 01/22/15  Yes [name], MD    Current Facility-Administered Medications  Medication Dose Route Frequency Provider Last Rate Last Dose  . 0.9 %  sodium chloride infusion  250 mL Intravenous PRN Erline Hau, MD      . albuterol (PROVENTIL) (2.5 MG/3ML) 0.083% nebulizer solution 2.5 mg  2.5 mg Nebulization Q2H PRN Erline Hau, MD      . cyclobenzaprine (FLEXERIL) tablet 25 mg  25 mg Oral Daily PRN Erline Hau, MD      . furosemide (LASIX) injection 40 mg  40 mg Intravenous Q12H Erline Hau, MD   40 mg at 07/03/15 0242  . hydrocerin (EUCERIN) cream 1 application  1 application Topical TID PRN Erline Hau, MD      . insulin aspart (novoLOG) injection 0-15 Units  0-15 Units Subcutaneous TID WC Estela Leonie Green, MD   2 Units at 07/03/15 1121  . insulin aspart (novoLOG) injection 0-5 Units  0-5 Units Subcutaneous QHS Erline Hau, MD   0 Units at 07/02/15 2200  . insulin  aspart (novoLOG) injection 4 Units  4 Units Subcutaneous TID WC Estela Leonie Green, MD   4 Units at 07/03/15 1121  . insulin glargine (LANTUS) injection 10 Units  10 Units Subcutaneous QHS Erline Hau, MD   10 Units at 07/02/15 2216  . lactulose (CHRONULAC) 10 GM/15ML solution 10 g  10 g Oral TID Erline Hau, MD   10 g at 07/03/15 1120  . ondansetron (ZOFRAN) tablet 4 mg  4 mg Oral Q6H PRN Erline Hau, MD       Or  . ondansetron [facility]) injection 4 mg  4 mg Intravenous Q6H PRN Erline Hau, MD      .  oxyCODONE (Oxy IR/ROXICODONE) immediate release tablet 10 mg  10 mg Oral Q4H PRN Erline Hau, MD   10 mg at 07/03/15 1429  . pantoprazole (PROTONIX) EC tablet 40 mg  40 mg Oral Daily Erline Hau, MD   40 mg at 07/03/15 1120  . rifaximin (XIFAXAN) tablet 550 mg  550 mg Oral BID Erline Hau, MD   550 mg at 07/03/15 1120  . senna-docusate (Senokot-S) tablet 1 tablet  1 tablet Oral QHS PRN Erline Hau, MD      . sodium chloride 0.9 % injection 3 mL  3 mL Intravenous Q12H Estela Leonie Green, MD   3 mL at 07/03/15 1120  . sodium chloride 0.9 % injection 3 mL  3 mL Intravenous PRN Erline Hau, MD      . spironolactone (ALDACTONE) tablet 25 mg  25 mg Oral Daily Erline Hau, MD   25 mg at 07/03/15 1119    Allergies as of 07/02/2015 - Review Complete 07/02/2015  Allergen Reaction Noted  . Penicillins Hives 07/31/2014    Family History  Problem Relation Age of Onset  . Colon cancer Neg Hx   . Anesthesia problems Neg Hx   . Hypotension Neg Hx   . Malignant hyperthermia Neg Hx   . Pseudochol deficiency Neg Hx   . Cancer Father     Social History   Social History  . Marital Status: Married    Spouse Name: N/A  . Number of Children: N/A  . Years of Education: N/A   Occupational History  . Not on file.   Social History Main Topics  . Smoking  status: Current Some Day Smoker -- 0.50 packs/day    Types: Cigarettes  . Smokeless tobacco: Never Used  . Alcohol Use: No     Comment: 30 years ago  . Drug Use: No     Comment: 30 yrs ago.  Marland Kitchen Sexual Activity:    Partners: Female    Museum/gallery curator: Condom     Comment: spouse   Other Topics Concern  . Not on file   Social History Narrative    Review of Systems: As mentioned in HPI   Physical Exam: Vital signs in last 24 hours: Temp:  [98.2 F (36.8 C)-98.9 F (37.2 C)] 98.9 F (37.2 C) (11/22 1552) Pulse Rate:  [88-102] 88 (11/22 1436) Resp:  [20] 20 (11/22 1436) BP: (147-156)/(53-78) 147/53 mmHg (11/22 1436) SpO2:  [97 %-100 %] 100 % (11/22 1436) Weight:  [341 lb 4.4 oz (154.8 kg)] 341 lb 4.4 oz (154.8 kg) (11/22 0644) Last BM Date: 07/02/15 General:   Alert,  Well-developed, sallow appearing. Obese. Head:  Normocephalic and atraumatic. Eyes:  Mild scleral icterus  Ears:  Normal auditory acuity. Nose:  No deformity, discharge,  or lesions. Mouth:  No deformity or lesions, dentition normal. Lungs:  Clear throughout to auscultation.   No wheezes, crackles, or rhonchi. No acute distress. Heart:  Regular rate and rhythm; no murmurs, clicks, rubs,  or gallops. Abdomen:  Soft, obese, unable to assess for HSM due to large AP diameter. Hugely edematous scrotum Rectal:  Deferred until time of colonoscopy.   Msk:  Symmetrical without gross deformities. Normal posture. Extremities:  Generalized anasarca extending to thighs/abdomen. LLE with bandage, resolving cellulitis. 3+ pitting edema Neurologic:  Alert and  oriented x4;  grossly normal neurologically. Psych:  Alert and cooperative. Normal mood and affect.  Intake/Output from previous day:  11/21 0701 - 11/22 0700 In: -  Out: 3200 [Urine:3200] Intake/Output this shift: Total I/O In: 843 [P.O.:840; I.V.:3] Out: -   Lab Results:  Recent Labs  07/02/15 1005 07/03/15 0634  WBC 3.7* 9.3  HGB 10.4* 11.3*   HCT 30.8* 34.3*  PLT 70* 99*   BMET  Recent Labs  07/02/15 1005 07/03/15 0634  NA 138 139  K 4.9 4.1  CL 106 104  CO2 26 26  GLUCOSE 129* 80  BUN 39* 36*  CREATININE 2.22* 2.12*  CALCIUM 8.2* 8.6*   LFT  Recent Labs  07/02/15 1005 07/03/15 0634  PROT 6.0* 6.7  ALBUMIN 2.2* 2.4*  AST 226* 235*  ALT 71* 72*  ALKPHOS 120 129*  BILITOT 1.8* 2.1*   PT/INR  Recent Labs  07/02/15 1005  LABPROT 17.4*  INR 1.41    Studies/Results: Dg Chest 2 View  07/02/2015  CLINICAL DATA:  Short of breath EXAM: CHEST  2 VIEW COMPARISON:  06/11/2015 FINDINGS: Normal heart size. Semi recumbent AP view of the lungs with low volumes. Grossly clear. No pneumothorax. No pleural effusion. No obvious pneumothorax. IMPRESSION: Low volumes.  No active cardiopulmonary disease. Electronically Signed   By: Marybelle Killings M.D.   On: 07/02/2015 11:39    Impression: 54 year old male with HCV/ETOH cirrhosis (MELD 20), decompensated with anasarca and notable scrotal edema but slowly responding with diuresis. In setting of hypoalbuminemia, end stage liver disease, options limited regarding anasarca. Would need to lose weight prior to candidacy for liver transplant. Overall poor prognosis and agree with palliative care consultation in future. Would continue Lasix 40 mg IV BID and Aldactone 25 mg daily. If creatinine continues to improve, may consider increasing aldactone to 50 mg daily. Doubt runs of albumin followed by lasix would offer much benefit in this scenario.   Plan: Titrate lactulose dosing to have 3 soft BMs daily Continue Xifaxan BID Protonix once daily Lasix 40 mg BID with aldactone 25 mg daily Strict I/Os Weights daily 2g Na diet Agree with palliative care consultation  Orvil Feil, ANP-BC [facility]     LOS: 1 day    07/03/2015, 4:05 PM    REVIEWED-NO ADDITIONAL RECOMMENDATIONS.

## 2015-07-03 NOTE — Care Management Note (Signed)
Case Management Note  Patient Details  Name: ERVIN BHARDWAJ MRN: [id number] Date of Birth: [birth year]  Subjective/Objective:                  Pt admitted from home with anasarca. Pt lives with his wife and will return home at discharge. Pt is active with [facility] RN. Pt has a cane and BSC for home use.   Action/Plan: Will arrange resumption of [facility] RN at discharge per pts request.  Expected Discharge Date:  07/06/15               Expected Discharge Plan:  [place]  In-House Referral:  NA  Discharge planning Services  CM Consult  Post Acute Care Choice:  Resumption of Svcs/PTA Provider Choice offered to:  Patient  DME Arranged:    DME Agency:  [place]:  RN [facility] Agency:     Status of Service:  Completed, signed off  Medicare Important Message Given:    Date Medicare IM Given:    Medicare IM give by:    Date Additional Medicare IM Given:    Additional Medicare Important Message give by:     If discussed at [place] of Stay Meetings, dates discussed:    Additional Comments:  Joylene Draft, RN 07/03/2015, 11:34 AM

## 2015-07-03 NOTE — Telephone Encounter (Signed)
[name], [name], called to let us know that [name] is in the hospital room 320 and to cancel his appointment for today. She asked if SF would stop by to see him.

## 2015-07-03 NOTE — Telephone Encounter (Signed)
He is coming home on Friday, his wife will pick up then. Sabra Heck is only here tomorrow, make sure this gets to him

## 2015-07-03 NOTE — Progress Notes (Signed)
*  PRELIMINARY RESULTS* Echocardiogram 2D Echocardiogram with definity has been performed.  Leavy Cella 07/03/2015, 10:09 AM

## 2015-07-03 NOTE — Progress Notes (Signed)
[facility] PROGRESS NOTE  JUSTN ENTERLINE [id number] DOB: 02/07/1961 DOA: 07/02/2015 PCP: [name], MD  Assessment/Plan: Anasarca/EtOH and hepatitis C induced cirrhosis  -Mainly due to hypoalbuminemia and decreased oncotic pressure in the face of cirrhosis and what is likely end-stage liver disease. -2-D echo also shows evidence for grade 1 diastolic dysfunction so likely has a component of acute diastolic CHF as well. -Still appears massively volume overloaded on exam. -Is 2.3 L negative since admission, continue to strive for negative fluid balance. -Continue Lasix at current dose as appears to have adequate diuresis. -GI consult has been requested with pending recommendations. -Given poor long-term prognosis, will request palliative care consultation to discuss goals of care.  Right lower extremity wound -Received thorough treatment with IV antibiotics during prior admission with 15 days of IV vancomycin. -Leg does appear improved, do not believe this represents cellulitis at this point. -We'll continue dressing changes, will need follow-up with the wound clinic upon discharge.  Acute on chronic kidney disease at least stage III -Baseline creatinine appears to be around 1.4, creatinine is improving with diuresis.  -Check renal function in a.m.  Type 2 diabetes mellitus -Well-controlled, continue current management.  Scrotal edema -Secondary to anasarca from cirrhosis and acute diastolic CHF.  Thrombocytopenia -Secondary to cirrhosis, monitor and avoid heparin products.  Code Status: Full code Family Communication: Patient only, discussed with wife at bedside on 11/21  Disposition Plan: To be determined   Consultants:  GI   Antibiotics:  None   Subjective: Still complains of significant scrotal edema, also complains of pain over his abdomen and legs, suspect this is from massive edema and skin stretching.  Objective: Filed Vitals:   07/02/15 1514 07/02/15 2058 07/03/15 0644 07/03/15 1436  BP: 166/59 155/78 156/60 147/53  Pulse: 95 102 92 88  Temp: 98.1 F (36.7 C) 98.2 F (36.8 C) 98.2 F (36.8 C) 98.2 F (36.8 C)  TempSrc:  Oral Oral Oral  Resp: 20 20 20 20   Height: 5\' 5"  (1.651 m)     Weight: 160.8 kg (354 lb 8 oz)  154.8 kg (341 lb 4.4 oz)   SpO2: 100% 99% 97% 100%    Intake/Output Summary (Last 24 hours) at 07/03/15 1548 Last data filed at 07/03/15 1300  Gross per 24 hour  Intake    843 ml  Output   3000 ml  Net  -2157 ml   Filed Weights   07/02/15 0935 07/02/15 1514 07/03/15 0644  Weight: 150.141 kg (331 lb) 160.8 kg (354 lb 8 oz) 154.8 kg (341 lb 4.4 oz)    Exam:   General:  Alert, awake, oriented 3  Cardiovascular: Regular rate and rhythm  Respiratory: Clear to auscultation bilaterally  Abdomen: Obese, soft, nontender, nondistended  Extremities: 3+ plus pitting edema bilaterally, reddish, purplish vesicular rash over anterior left lower leg   Neurologic:  Nonfocal  Data Reviewed: Basic Metabolic Panel:  Recent Labs Lab 07/02/15 1005 07/03/15 0634  NA 138 139  K 4.9 4.1  CL 106 104  CO2 26 26  GLUCOSE 129* 80  BUN 39* 36*  CREATININE 2.22* 2.12*  CALCIUM 8.2* 8.6*  MG 1.7  --    Liver Function Tests:  Recent Labs Lab 07/02/15 1005 07/03/15 0634  AST 226* 235*  ALT 71* 72*  ALKPHOS 120 129*  BILITOT 1.8* 2.1*  PROT 6.0* 6.7  ALBUMIN 2.2* 2.4*   No results for input(s): LIPASE, AMYLASE in the last 168 hours.  Recent Labs  Lab 07/02/15 1005  AMMONIA 55*   CBC:  Recent Labs Lab 07/02/15 1005 07/03/15 0634  WBC 3.7* 9.3  NEUTROABS 1.9  --   HGB 10.4* 11.3*  HCT 30.8* 34.3*  MCV 96.6 96.9  PLT 70* 99*   Cardiac Enzymes: No results for input(s): CKTOTAL, CKMB, CKMBINDEX, TROPONINI in the last 168 hours. BNP (last 3 results)  Recent Labs  06/11/15 1546  BNP 89.0    ProBNP (last 3 results)  Recent Labs  07/31/14 1220  PROBNP 582.6*     CBG:  Recent Labs Lab 07/02/15 1625 07/02/15 2057 07/03/15 0755 07/03/15 1110  GLUCAP 109* 163* 83 147*    No results found for this or any previous visit (from the past 240 hour(s)).   Studies: Dg Chest 2 View  07/02/2015  CLINICAL DATA:  Short of breath EXAM: CHEST  2 VIEW COMPARISON:  06/11/2015 FINDINGS: Normal heart size. Semi recumbent AP view of the lungs with low volumes. Grossly clear. No pneumothorax. No pleural effusion. No obvious pneumothorax. IMPRESSION: Low volumes.  No active cardiopulmonary disease. Electronically Signed   By: Marybelle Killings M.D.   On: 07/02/2015 11:39    Scheduled Meds: . furosemide  40 mg Intravenous Q12H  . insulin aspart  0-15 Units Subcutaneous TID WC  . insulin aspart  0-5 Units Subcutaneous QHS  . insulin aspart  4 Units Subcutaneous TID WC  . insulin glargine  10 Units Subcutaneous QHS  . lactulose  10 g Oral TID  . pantoprazole  40 mg Oral Daily  . rifaximin  550 mg Oral BID  . sodium chloride  3 mL Intravenous Q12H  . spironolactone  25 mg Oral Daily   Continuous Infusions:   Principal Problem:   Anasarca Active Problems:   Hepatic cirrhosis (HCC)   Scrotal edema   Diabetes (HCC)   Chronic hepatitis C with cirrhosis (HCC)   Thrombocytopenia ([place])   Morbid obesity ([place])    Time spent: 25 minutes. Greater than 50% of this time was spent in direct contact with the patient coordinating care.    Lelon Frohlich  [facility] Pager [phone]  If 7PM-7AM, please contact night-coverage at [email or web address], password [facility] 07/03/2015, 3:48 PM  LOS: 1 day

## 2015-07-03 NOTE — Telephone Encounter (Signed)
Noted  

## 2015-07-03 NOTE — Telephone Encounter (Signed)
REVIEWED-NO ADDITIONAL RECOMMENDATIONS. 

## 2015-07-03 NOTE — Care Management Obs Status (Signed)
[place] NOTIFICATION   Patient Details  Name: SABDIEL KERKSTRA MRN: [id number] Date of Birth: 1961-02-22   Medicare Observation Status Notification Given:  Yes    Joylene Draft, RN 07/03/2015, 11:33 AM

## 2015-07-04 ENCOUNTER — Ambulatory Visit: Payer: Medicare Other | Admitting: Family Medicine

## 2015-07-04 ENCOUNTER — Other Ambulatory Visit: Payer: Self-pay

## 2015-07-04 DIAGNOSIS — M79605 Pain in left leg: Secondary | ICD-10-CM

## 2015-07-04 DIAGNOSIS — L03115 Cellulitis of right lower limb: Secondary | ICD-10-CM | POA: Insufficient documentation

## 2015-07-04 DIAGNOSIS — M79606 Pain in leg, unspecified: Secondary | ICD-10-CM | POA: Insufficient documentation

## 2015-07-04 DIAGNOSIS — K7460 Unspecified cirrhosis of liver: Secondary | ICD-10-CM

## 2015-07-04 DIAGNOSIS — B182 Chronic viral hepatitis C: Secondary | ICD-10-CM

## 2015-07-04 DIAGNOSIS — N5089 Other specified disorders of the male genital organs: Secondary | ICD-10-CM

## 2015-07-04 DIAGNOSIS — M79602 Pain in left arm: Secondary | ICD-10-CM

## 2015-07-04 LAB — BASIC METABOLIC PANEL
Anion gap: 5 (ref 5–15)
BUN: 33 mg/dL — AB (ref 6–20)
CALCIUM: 8.2 mg/dL — AB (ref 8.9–10.3)
CO2: 28 mmol/L (ref 22–32)
CREATININE: 1.99 mg/dL — AB (ref 0.61–1.24)
Chloride: 102 mmol/L (ref 101–111)
GFR calc Af Amer: 42 mL/min — ABNORMAL LOW (ref >60)
GFR, EST NON AFRICAN AMERICAN: 37 mL/min — AB (ref >60)
GLUCOSE: 81 mg/dL (ref 65–99)
POTASSIUM: 4.2 mmol/L (ref 3.5–5.1)
SODIUM: 135 mmol/L (ref 135–145)

## 2015-07-04 LAB — GLUCOSE, CAPILLARY
GLUCOSE-CAPILLARY: 84 mg/dL (ref 65–99)
Glucose-Capillary: 125 mg/dL — ABNORMAL HIGH (ref 65–99)
Glucose-Capillary: 124 mg/dL — ABNORMAL HIGH (ref 65–99)
Glucose-Capillary: 90 mg/dL (ref 65–99)

## 2015-07-04 LAB — CBC
HEMATOCRIT: 30.7 % — AB (ref 39.0–52.0)
Hemoglobin: 10.4 g/dL — ABNORMAL LOW (ref 13.0–17.0)
MCH: 32.6 pg (ref 26.0–34.0)
MCHC: 33.9 g/dL (ref 30.0–36.0)
MCV: 96.2 fL (ref 78.0–100.0)
PLATELETS: 58 10*3/uL — AB (ref 150–400)
RBC: 3.19 MIL/uL — ABNORMAL LOW (ref 4.22–5.81)
RDW: 14.6 % (ref 11.5–15.5)
WBC: 5.6 10*3/uL (ref 4.0–10.5)

## 2015-07-04 MED ORDER — IPRATROPIUM-ALBUTEROL 0.5-2.5 (3) MG/3ML IN SOLN
3.0000 mL | Freq: Three times a day (TID) | RESPIRATORY_TRACT | Status: DC
  Administered 2015-07-04 – 2015-07-07 (×9): 3 mL via RESPIRATORY_TRACT
  Filled 2015-07-04 (×10): qty 3

## 2015-07-04 MED ORDER — OXYCODONE HCL 10 MG PO TABS: ORAL_TABLET | ORAL | Status: DC

## 2015-07-04 MED ORDER — ALBUMIN HUMAN 25 % IV SOLN
50.0000 g | Freq: Two times a day (BID) | INTRAVENOUS | Status: DC
  Administered 2015-07-04 – 2015-07-06 (×4): 50 g via INTRAVENOUS
  Filled 2015-07-04 (×6): qty 200

## 2015-07-04 MED ORDER — FUROSEMIDE 10 MG/ML IJ SOLN
40.0000 mg | Freq: Two times a day (BID) | INTRAMUSCULAR | Status: DC
  Administered 2015-07-05 – 2015-07-06 (×3): 40 mg via INTRAVENOUS
  Filled 2015-07-04 (×4): qty 4

## 2015-07-04 MED ORDER — LORAZEPAM 2 MG/ML IJ SOLN
0.5000 mg | Freq: Two times a day (BID) | INTRAMUSCULAR | Status: DC
  Administered 2015-07-04 – 2015-07-05 (×3): 0.5 mg via INTRAVENOUS
  Filled 2015-07-04 (×4): qty 1

## 2015-07-04 MED ORDER — HYDROMORPHONE HCL 1 MG/ML IJ SOLN
0.5000 mg | Freq: Four times a day (QID) | INTRAMUSCULAR | Status: AC
  Administered 2015-07-04 – 2015-07-06 (×8): 0.5 mg via INTRAVENOUS
  Filled 2015-07-04 (×8): qty 1

## 2015-07-04 MED ORDER — FUROSEMIDE 10 MG/ML IJ SOLN: 40.0000 mg | Freq: Once | INTRAMUSCULAR | Status: DC

## 2015-07-04 MED ORDER — OMEPRAZOLE 20 MG PO CPDR: 40.0000 mg | DELAYED_RELEASE_CAPSULE | Freq: Every day | ORAL | Status: DC

## 2015-07-04 NOTE — Telephone Encounter (Signed)
Aware, oxycodone script is ready.

## 2015-07-04 NOTE — Progress Notes (Signed)
PROGRESS NOTE  RAND FORSETH [id number] DOB: 01/09/1961 DOA: 07/02/2015 PCP: [name], MD  Summary: [id number] PMH alcoholic cirrhosis, hepatitis C, recent 15 day hospitalization for RLE cellulitis, presented with anasarca. Admitted for anasarca secondary to cirrhosis for IV diuresis.   Assessment/Plan: 1. Anasarca, secondary to cirrhosis complicated by diastolic dysfunction. Excellent UOP, down 5 L since admission. 2. 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 Acute diastolic CHF 4. AKI, improving.  5. RLE wound, skin changes not felt to be cellulitis per admitting physician; concur. 6. DM type 2, stable.  7. COPD, stable.  8. Anemia of chronic disease, stable. 9. Recent hospitalization for cellulitis RLE.   Overall stable, remains with marked volume overload.  Continue lactulose, Xifaxan, PPI, Lasix, aldactone per GI. Agree with albumin.  Code Status: full code DVT prophylaxis: SCDs Family Communication: none present Disposition Plan: home  Murray Hodgkins, MD  [facility]  Pager [phone] If 7PM-7AM, please contact night-coverage at [email or web address], password [facility] 07/04/2015, 3:21 PM  LOS: 2 days   Consultants:  GI  Procedures:  Echo Study Conclusions  - Left ventricle: The cavity size was normal. Wall thickness was increased in a pattern of severe LVH. Systolic function was normal. The estimated ejection fraction was in the range of 60% to 65%. Wall motion was normal; there were no regional wall motion abnormalities. Doppler parameters are consistent with abnormal left ventricular relaxation (grade 1 diastolic dysfunction). - Aortic valve: Mildly calcified annulus. Trileaflet; mildly thickened leaflets. Valve area (VTI): 2.54 cm^2. Valve area (Vmax): 2.48 cm^2. - Technically difficult study. Definity echocontrast was used  to enhance visualization.  Antibiotics:    HPI/Subjective: C/o BLE edema, BLE "burning" sensation.  Objective: Filed Vitals:   07/03/15 1552 07/03/15 2144 07/04/15 0609 07/04/15 1300  BP:  136/57 143/62 129/63  Pulse:  89 85 81  Temp: 98.9 F (37.2 C) 98.2 F (36.8 C) 97.7 F (36.5 C) 98.3 F (36.8 C)  TempSrc: Oral Oral Oral Oral  Resp:  20 20 20   Height:      Weight:   159.9 kg (352 lb 8.3 oz)   SpO2:  99% 100% 100%    Intake/Output Summary (Last 24 hours) at 07/04/15 1521 Last data filed at 07/04/15 1300  Gross per 24 hour  Intake   1164 ml  Output   3350 ml  Net  -2186 ml     Filed Weights   07/02/15 1514 07/03/15 0644 07/04/15 0609  Weight: 160.8 kg (354 lb 8 oz) 154.8 kg (341 lb 4.4 oz) 159.9 kg (352 lb 8.3 oz)    Exam:    General:  Appears calm and comfortable Cardiovascular: RRR, no m/r/g. 3+ BLE edema up to scrotum, abdomen Skin: RLE chronic skin changes.  Respiratory: CTA bilaterally, no w/r/r. Normal respiratory effort. Psychiatric: grossly normal mood and affect, speech fluent and appropriate  New data reviewed:  CBG stable  Creatinine improved 1.99, BUN trending down  Hgb stable 10.4  Plts stable 58  Scheduled Meds: . furosemide  40 mg Intravenous Q12H  . insulin aspart  0-15 Units Subcutaneous TID WC  . insulin aspart  0-5 Units Subcutaneous QHS  . insulin aspart  4 Units Subcutaneous TID WC  . insulin glargine  10 Units Subcutaneous QHS  . lactulose  10 g Oral TID  . pantoprazole  40 mg Oral Daily  . rifaximin  550 mg Oral BID  . sodium chloride  3 mL Intravenous Q12H  .  spironolactone  25 mg Oral Daily   Continuous Infusions:   Principal Problem:   Anasarca Active Problems:   Hepatic cirrhosis (HCC)   Diabetes (HCC)   Chronic hepatitis C with cirrhosis (HCC)   Scrotal edema   Thrombocytopenia (HCC)   Morbid obesity (HCC)   Leg pain, lateral   Time spent 35 minutes

## 2015-07-04 NOTE — Progress Notes (Signed)
Subjective:    Objective: Vital signs in last 24 hours: Temp:  [97.7 F (36.5 C)-98.9 F (37.2 C)] 97.7 F (36.5 C) (11/23 0609) Pulse Rate:  [85-89] 85 (11/23 0609) Resp:  [20] 20 (11/23 0609) BP: (136-147)/(53-62) 143/62 mmHg (11/23 0609) SpO2:  [99 %-100 %] 100 % (11/23 0609) Weight:  [352 lb 8.3 oz (159.9 kg)] 352 lb 8.3 oz (159.9 kg) (11/23 0609) Last BM Date: 07/02/15 General:   Alert,  Well-developed, well-nourished, pleasant and cooperative in NAD Head:  Normocephalic and atraumatic. Eyes:  Sclera clear, no icterus.  Chest: CTA bilaterally without rales, rhonchi, crackles.    Heart:  Regular rate and rhythm; no murmurs, clicks, rubs,  or gallops. Abdomen:  Soft, nontender and nondistended. No masses, hepatosplenomegaly or hernias noted. Normal bowel sounds, without guarding, and without rebound.   Extremities:  Without clubbing, deformity or edema. Neurologic:  Alert and  oriented x4;  grossly normal neurologically. Skin:  Intact without significant lesions or rashes. Psych:  Alert and cooperative. Normal mood and affect.  Intake/Output from previous day: 11/22 0701 - 11/23 0700 In: 1287 [P.O.:1284; I.V.:3] Out: 3200 [Urine:3200] Intake/Output this shift:    Lab Results: CBC  Recent Labs  07/02/15 1005 07/03/15 0634 07/04/15 0550  WBC 3.7* 9.3 5.6  HGB 10.4* 11.3* 10.4*  HCT 30.8* 34.3* 30.7*  MCV 96.6 96.9 96.2  PLT 70* 99* 58*   BMET  Recent Labs  07/02/15 1005 07/03/15 0634 07/04/15 0550  NA 138 139 135  K 4.9 4.1 4.2  CL 106 104 102  CO2 26 26 28   GLUCOSE 129* 80 81  BUN 39* 36* 33*  CREATININE 2.22* 2.12* 1.99*  CALCIUM 8.2* 8.6* 8.2*   LFTs  Recent Labs  07/02/15 1005 07/03/15 0634  BILITOT 1.8* 2.1*  ALKPHOS 120 129*  AST 226* 235*  ALT 71* 72*  PROT 6.0* 6.7  ALBUMIN 2.2* 2.4*   No results for input(s): LIPASE in the last 72 hours. PT/INR  Recent Labs  07/02/15 1005  LABPROT 17.4*  INR 1.41      Imaging Studies: Ct  Abdomen Pelvis Wo Contrast  06/23/2015  CLINICAL DATA:  Lower abdominal pain and swelling. Concern for constipation/ impaction. History of diabetes, hypertension, splenomegaly, COPD, GERD, hepatitis-C and cirrhosis. EXAM: CT ABDOMEN AND PELVIS WITHOUT CONTRAST TECHNIQUE: Multidetector CT imaging of the abdomen and pelvis was performed following the standard protocol without IV contrast. COMPARISON:  01/15/2015 FINDINGS: Lower chest: Emphysematous changes are identified within the lung bases. Small right pleural effusion noted. Gynecomastia bilaterally. Heart size is normal. No imaged pericardial effusion or significant coronary artery calcifications. Upper abdomen: There is a ascites within the abdomen, particularly within the perihepatic region. The liver is scalloped and small consistent with cirrhotic morphology. The gallbladder is present and contains dependent and calcified gallstones. The spleen is markedly enlarged. Varices are identified within the left upper quadrant. Intrarenal calculus is identified within the left lower pole, measuring approximately 6 mm in diameter. There is no hydronephrosis. The right kidney is normal in appearance. Gastrointestinal tract: The stomach and proximal small bowel loops are normal in appearance. Distal small bowel loops contain fecal material. Proximal colonic loops are markedly distended with stool. There is gradual tapering to level of the rectum which is normal in caliber. However there is no obvious stricture or mass as a point of obstruction. Pelvis: The urinary bladder is decompressed. The prostate gland and seminal vesicles are normal in appearance. Retroperitoneum: There are enlarged lymph nodes in  the right internal iliac chain, external iliac chain and common femoral chain. Largest is identified in the right common femoral region common measuring 2.4 x 3.9 cm on image 89 of series 2. Smaller lymph nodes are identified in the left pelvic sidewall better  nonspecific. There are small bilateral and nonspecific inguinal lymph nodes. Enlargement of the left gonadal vein like related to splenorenal shunt and portal venous hypertension. Abdominal wall: Diffuse anasarca. Osseous structures: Spondylosis in the thoracolumbar spine. No suspicious lytic or blastic lesions are identified. IMPRESSION: 1. Cirrhosis and marked splenomegaly. Splenorenal shunt. Findings consistent with portal venous hypertension. 2. Distended left gonadal vein, likely also related to portal venous hypertension and splenorenal shunt. 3. Significant amount of stool within the distal small bowel loops and proximal colon although there is no mass identified as a point of obstruction. Findings most consistent with colonic ileus. 4. Developing adenopathy in the pelvis. Right common femoral node is 2.4 x 3.9 cm. Malignancy should be considered. Correlation with PSA recommended. Nonspecific bilateral inguinal lymph nodes. 5. Cholelithiasis. 6. Nonobstructing left intrarenal calculus. Electronically Signed   By: Nolon Nations M.D.   On: 06/23/2015 18:23   Dg Chest 2 View  07/02/2015  CLINICAL DATA:  Short of breath EXAM: CHEST  2 VIEW COMPARISON:  06/11/2015 FINDINGS: Normal heart size. Semi recumbent AP view of the lungs with low volumes. Grossly clear. No pneumothorax. No pleural effusion. No obvious pneumothorax. IMPRESSION: Low volumes.  No active cardiopulmonary disease. Electronically Signed   By: Marybelle Killings M.D.   On: 07/02/2015 11:39   Dg Tibia/fibula Right  06/11/2015  CLINICAL DATA:  Right leg cellulitis, pain and redness EXAM: RIGHT TIBIA AND FIBULA - 2 VIEW COMPARISON:  None. FINDINGS: Two views of the right tibia-fibula submitted. No acute fracture or subluxation. No evidence of osteomyelitis. Diffuse soft tissue swelling. No radiopaque foreign body. IMPRESSION: No acute fracture or subluxation.  Diffuse soft tissue swelling. Electronically Signed   By: Lahoma Crocker M.D.   On:  06/11/2015 15:47   US Scrotum  06/11/2015  CLINICAL DATA:  Two day history of right lower extremity pain and erythema. Blistering involving the right groin. Current history of hypertension and diabetes. Current history of hepatic cirrhosis. EXAM: SCROTAL ULTRASOUND DOPPLER ULTRASOUND OF THE TESTICLES TECHNIQUE: Complete ultrasound examination of the testicles, epididymis, and other scrotal structures was performed. Color and spectral Doppler ultrasound were also utilized to evaluate blood flow to the testicles. COMPARISON:  CT abdomen pelvis 01/15/2015 which included the scrotum. FINDINGS: Right testicle Measurements: Approximately 4.8 x 1.7 x 2.6 cm. Normal parenchymal echotexture without mass or microlithiasis. Normal color Doppler flow without evidence of hyperemia. Left testicle Measurements: Approximately 4.4 x 2.3 x 2.5 cm. Normal parenchymal echotexture without mass or microlithiasis. Intratesticular varix. Normal color Doppler flow otherwise without evidence of hyperemia. Right epididymis: Normal in size and appearance without evidence of hyperemia. Left epididymis: Normal in size and appearance without evidence of hyperemia. Hydrocele: Small right hydrocele. Moderately large left hydrocele. Present on the prior CT and likely unchanged. Varicocele:  Large left varicocele as noted on the prior CT. Pulsed Doppler interrogation of both testes demonstrates normal low resistance arterial and venous waveforms bilaterally. IMPRESSION: 1. No evidence of testicular torsion or epididymo-orchitis. 2. Large left varicocele, including an intratesticular varix. 3. Moderately large left hydrocele and small right hydrocele. 4. Above findings were present on the prior CT from June, 2016, and are not significantly changed Electronically Signed   By: Sherran Needs.D.  On: 06/11/2015 17:24   US Venous Img Lower Unilateral Right  06/11/2015  CLINICAL DATA:  Chronic right leg pain and swelling.  Cellulitis. EXAM:  RIGHT LOWER EXTREMITY VENOUS DOPPLER ULTRASOUND TECHNIQUE: Gray-scale sonography with graded compression, as well as color Doppler and duplex ultrasound were performed to evaluate the lower extremity deep venous systems from the level of the common femoral vein and including the common femoral, femoral, profunda femoral, popliteal and calf veins including the posterior tibial, peroneal and gastrocnemius veins when visible. The superficial great saphenous vein was also interrogated. Spectral Doppler was utilized to evaluate flow at rest and with distal augmentation maneuvers in the common femoral, femoral and popliteal veins. The patient was unable to tolerate probe pressure in the groin and proximal thigh due to prominent soft tissue swelling and redness and weeping blisters in that area. COMPARISON:  Venous duplex ultrasound dated 11/15/2014 FINDINGS: Contralateral Common Femoral Vein: Not visualized. Common Femoral Vein: Not visualized. Saphenofemoral Junction: Not visualized. Profunda Femoral Vein: Not visualized. Femoral Vein: Proximal femoral vein was not visualized. No evidence of thrombus. Normal compressibility, respiratory phasicity and response to augmentation. Popliteal Vein: No evidence of thrombus. Normal compressibility, respiratory phasicity and response to augmentation. Calf Veins: No evidence of thrombus. Normal compressibility and flow on color Doppler imaging. The peroneal vein was not seen. Venous Reflux:  None. Other Findings:  None. IMPRESSION: No evidence of deep venous thrombosis. However, the study is limited due to the cellulitis and weeping wounds as described above. Electronically Signed   By: Lorriane Shire M.D.   On: 06/11/2015 17:28   [place] Art/ven Flow Abd Pelv Doppler  06/11/2015  CLINICAL DATA:  Two day history of right lower extremity pain and erythema. Blistering involving the right groin. Current history of hypertension and diabetes. Current history of hepatic cirrhosis. EXAM:  SCROTAL ULTRASOUND DOPPLER ULTRASOUND OF THE TESTICLES TECHNIQUE: Complete ultrasound examination of the testicles, epididymis, and other scrotal structures was performed. Color and spectral Doppler ultrasound were also utilized to evaluate blood flow to the testicles. COMPARISON:  CT abdomen pelvis 01/15/2015 which included the scrotum. FINDINGS: Right testicle Measurements: Approximately 4.8 x 1.7 x 2.6 cm. Normal parenchymal echotexture without mass or microlithiasis. Normal color Doppler flow without evidence of hyperemia. Left testicle Measurements: Approximately 4.4 x 2.3 x 2.5 cm. Normal parenchymal echotexture without mass or microlithiasis. Intratesticular varix. Normal color Doppler flow otherwise without evidence of hyperemia. Right epididymis: Normal in size and appearance without evidence of hyperemia. Left epididymis: Normal in size and appearance without evidence of hyperemia. Hydrocele: Small right hydrocele. Moderately large left hydrocele. Present on the prior CT and likely unchanged. Varicocele:  Large left varicocele as noted on the prior CT. Pulsed Doppler interrogation of both testes demonstrates normal low resistance arterial and venous waveforms bilaterally. IMPRESSION: 1. No evidence of testicular torsion or epididymo-orchitis. 2. Large left varicocele, including an intratesticular varix. 3. Moderately large left hydrocele and small right hydrocele. 4. Above findings were present on the prior CT from June, 2016, and are not significantly changed Electronically Signed   By: Evangeline Dakin M.D.   On: 06/11/2015 17:24   Dg Chest Portable 1 View  06/11/2015  CLINICAL DATA:  Initial encounter for shortness of breath EXAM: PORTABLE CHEST 1 VIEW COMPARISON:  02/01/2015. FINDINGS: 1532 hours. Low volume film with lordotic patient positioning. Haziness over the lower lungs compatible with superimposition of soft tissues. The lungs are clear without focal pneumonia, edema, pneumothorax or  pleural effusion. Cardiopericardial silhouette is at upper  limits of normal for size. Telemetry leads overlie the chest. IMPRESSION: Low volume film without acute cardiopulmonary findings. Electronically Signed   By: Misty Stanley M.D.   On: 06/11/2015 15:46   Dg Abd Portable 1v  06/24/2015  CLINICAL DATA:  Abdominal pain, constipation/D ileus. EXAM: PORTABLE ABDOMEN - 1 VIEW COMPARISON:  CT abdomen and pelvis 06/23/2015. Chest in two views abdomen 01/29/2014. FINDINGS: The patient's size somewhat limits the study. Gaseous distention of small bowel is identified. There is a large volume of gas and stool in the ascending colon. IMPRESSION: Bowel gas pattern compatible with ileus. Large volume of gas and stool ascending colon. Electronically Signed   By: Inge Rise M.D.   On: 06/24/2015 13:02  [2 weeks]   Assessment: 55 year old male with HCV/ETOH cirrhosis (MELD 20), decompensated with anasarca and notable scrotal edema but slowly responding with diuresis. In setting of hypoalbuminemia, end stage liver disease, options limited regarding anasarca. Would need to lose weight prior to candidacy for liver transplant. Overall poor prognosis and agree with palliative care consultation in future.   Weight up 11 pounds in last 24 hours. No improvement in edema notable. This morning he complains of left upper thigh with intense burning sensation. occuring for couple of days but worse at this time. ?related to edema versus DVT?  Would continue Lasix 40 mg IV BID and Aldactone 25 mg daily. If creatinine continues to improve, may consider increasing aldactone to 50 mg daily. Consider trial of albumin/lasix runs.    Weight up 11 pounds in last 24 hours.   Plan: 1. Management of left leg pain per attending. ?related to edema vs DVT.  2. Continue current diuretic regimen, may increase aldactone if creatinine continues to improve.  3. Strict I/O, 2 g NA diet.  4. Consider albumin/lasix runs. To discuss  with [name].   Laureen Ochs. Bernarda Caffey [facility] [phone] 11/23/201610:47 AM     LOS: 2 days

## 2015-07-05 LAB — BASIC METABOLIC PANEL
ANION GAP: 7 (ref 5–15)
BUN: 40 mg/dL — ABNORMAL HIGH (ref 6–20)
CO2: 27 mmol/L (ref 22–32)
Calcium: 8.1 mg/dL — ABNORMAL LOW (ref 8.9–10.3)
Chloride: 100 mmol/L — ABNORMAL LOW (ref 101–111)
Creatinine, Ser: 2.02 mg/dL — ABNORMAL HIGH (ref 0.61–1.24)
GFR calc Af Amer: 42 mL/min — ABNORMAL LOW (ref >60)
GFR calc non Af Amer: 36 mL/min — ABNORMAL LOW (ref >60)
GLUCOSE: 103 mg/dL — AB (ref 65–99)
POTASSIUM: 4.4 mmol/L (ref 3.5–5.1)
Sodium: 134 mmol/L — ABNORMAL LOW (ref 135–145)

## 2015-07-05 LAB — GLUCOSE, CAPILLARY
GLUCOSE-CAPILLARY: 140 mg/dL — AB (ref 65–99)
GLUCOSE-CAPILLARY: 149 mg/dL — AB (ref 65–99)
Glucose-Capillary: 103 mg/dL — ABNORMAL HIGH (ref 65–99)
Glucose-Capillary: 160 mg/dL — ABNORMAL HIGH (ref 65–99)

## 2015-07-05 LAB — MAGNESIUM: Magnesium: 1.4 mg/dL — ABNORMAL LOW (ref 1.7–2.4)

## 2015-07-05 MED ORDER — PHENAZOPYRIDINE HCL 100 MG PO TABS
100.0000 mg | ORAL_TABLET | Freq: Once | ORAL | Status: AC
  Administered 2015-07-05: 100 mg via ORAL
  Filled 2015-07-05: qty 1

## 2015-07-05 MED ORDER — MAGNESIUM SULFATE 2 GM/50ML IV SOLN
2.0000 g | Freq: Once | INTRAVENOUS | Status: AC
  Administered 2015-07-05: 2 g via INTRAVENOUS
  Filled 2015-07-05: qty 50

## 2015-07-05 MED ORDER — LACTULOSE 10 GM/15ML PO SOLN
20.0000 g | Freq: Three times a day (TID) | ORAL | Status: DC
  Administered 2015-07-05 – 2015-07-07 (×6): 20 g via ORAL
  Filled 2015-07-05 (×6): qty 30

## 2015-07-05 MED ORDER — HYDROXYZINE HCL 25 MG PO TABS
25.0000 mg | ORAL_TABLET | Freq: Three times a day (TID) | ORAL | Status: DC | PRN
  Administered 2015-07-05 – 2015-07-09 (×4): 25 mg via ORAL
  Filled 2015-07-05 (×4): qty 1

## 2015-07-05 MED ORDER — MAGNESIUM SULFATE 4 GM/100ML IV SOLN: 4.0000 g | Freq: Once | INTRAVENOUS | Status: DC

## 2015-07-05 MED ORDER — MAGNESIUM SULFATE 2 GM/50ML IV SOLN
2.0000 g | INTRAVENOUS | Status: AC
  Administered 2015-07-05: 2 g via INTRAVENOUS
  Filled 2015-07-05: qty 50

## 2015-07-05 MED ORDER — DIPHENHYDRAMINE HCL 12.5 MG/5ML PO ELIX: 12.5000 mg | ORAL_SOLUTION | Freq: Four times a day (QID) | ORAL | Status: DC | PRN

## 2015-07-05 NOTE — Progress Notes (Signed)
Patient ID: DONTERRIOUS REDUS, male   DOB: [birth year], 54 y.o.   MRN: [id number]   Assessment/Plan: ADMITTED WITH ANASARCA, SCROTAL EDEMA , AND LEG PAIN. CLINICALLY IMPROVED.  PLAN:   1. ALBUMIN/LASIX FOR 72 HRS 2. REPLACE Mg 3. CONTINUE TO MONITOR SYMPTOMS. 4. SPOKE WITH UROLOGY. NO ADDITIONAL INTERVENTION NEEDED AT THIS TIME. LIFT SCROTUM WITH TOWEL. 5. DIPHENHYDRAMINE 12.5 MG Q6H PRN ITCHING   Subjective: Since I last evaluated the patient WEIGHT DOWN. SCROTAL DISCOMFORT, LEG PAIN, AND LEG SWELLING BETTER. ANXIETY IMPROVED WITH ATIVAN. PAIN CONTROLLED WITH DILAUDID. ITCHING AND REQUESTING BENADRYL.  Objective: Vital signs in last 24 hours: Filed Vitals:   07/04/15 2204 07/05/15 0712  BP: 142/66 134/57  Pulse: 91 88  Temp: 98.7 F (37.1 C) 98 F (36.7 C)  Resp: 20 20   General appearance: alert, cooperative and no distress Resp: clear to auscultation bilaterally Cardio: regular rate and rhythm GI: soft, non-tender; bowel sounds normal;  OBESE Extremities: edema 4+ BILE LE AND SCROTUM  Lab Results:  Mg 1.4 K 4.4 Cr 2.02   Studies/Results: No results found.  Medications: I have reviewed the patient's current medications.   LOS: 5 days   Barney Drain 01/19/2014, 2:23 PM

## 2015-07-05 NOTE — Progress Notes (Signed)
PROGRESS NOTE  [name] [id number] DOB: April 20, 1961 DOA: 07/02/2015 PCP: [name], MD  Summary: [id number] PMH alcoholic cirrhosis, hepatitis C, recent 15 day hospitalization for RLE cellulitis, presented with anasarca. Admitted for anasarca secondary to cirrhosis for IV diuresis.   Assessment/Plan: 1. Anasarca, secondary to cirrhosis complicated by diastolic dysfunction. Excellent UOP, down 7.7L since admission. 2. 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 3. Acute diastolic CHF. Improving. 4. AKI, remains stable, improved over last discharge. Suspect component of CKD.  5. RLE wound, skin changes stable. No evidence of infection. 6. DM type 2, remains stable.  7. COPD, stable.  8. Anemia of chronic disease, stable. 9. Recent hospitalization for cellulitis RLE.   Overall making slow improvement with diuresis.    Continue Lasix. Appreciate GI recommendations.   Code Status: full code DVT prophylaxis: SCDs Family Communication: Wife, aunt, and cousin bedside. Discussed with patient who understands and has no concerns at this time. Disposition Plan: Discharge home once improved.   Murray Hodgkins, MD  [facility]  Pager [phone] If 7PM-7AM, please contact night-coverage at [email or web address], password [facility] 07/05/2015, 6:56 AM  LOS: 3 days   Consultants:  GI  Procedures:  Echo Study Conclusions  - Left ventricle: The cavity size was normal. Wall thickness was increased in a pattern of severe LVH. Systolic function was normal. The estimated ejection fraction was in the range of 60% to 65%. Wall motion was normal; there were no regional wall motion abnormalities. Doppler parameters are consistent with abnormal left ventricular relaxation (grade 1 diastolic dysfunction). - Aortic valve: Mildly calcified annulus. Trileaflet; mildly thickened  leaflets. Valve area (VTI): 2.54 cm^2. Valve area (Vmax): 2.48 cm^2. - Technically difficult study. Definity echocontrast was used to enhance visualization.  Antibiotics:    HPI/Subjective: Doing better, decreased edema. Mild burning and itching in legs. Breathing improving with treatment.  Eating with out difficulty.   Objective: Filed Vitals:   07/04/15 1300 07/04/15 1714 07/04/15 2032 07/04/15 2204  BP: 129/63   142/66  Pulse: 81   91  Temp: 98.3 F (36.8 C)   98.7 F (37.1 C)  TempSrc: Oral   Oral  Resp: 20   20  Height:      Weight:      SpO2: 100% 100% 96% 93%    Intake/Output Summary (Last 24 hours) at 07/05/15 0656 Last data filed at 07/04/15 1825  Gross per 24 hour  Intake    720 ml  Output   1500 ml  Net   -780 ml     Filed Weights   07/02/15 1514 07/03/15 0644 07/04/15 0609  Weight: 160.8 kg (354 lb 8 oz) 154.8 kg (341 lb 4.4 oz) 159.9 kg (352 lb 8.3 oz)    Exam:  Afebrile, VSS General:  Appears comfortable, calm.  Eyes: PERRL, normal lids, irises Cardiovascular: Regular rate and rhythm, no murmur, rub or gallop. 3+ lower extremity edema including scrotum and into the abd Respiratory: Clear to auscultation bilaterally, no wheezes, rales or rhonchi. Normal respiratory effort. Skin: no change to erythema right lower extremity Psychiatric: grossly normal mood and affect, speech fluent and appropriate Neurologic: grossly non-focal.  New data reviewed:  -7.6 L since admission  Sodium 134  BMP stable. BUN 40, Creatinine 2.02  Magnesium 1.4  Scheduled Meds: . albumin human  50 g Intravenous BID  . furosemide  40 mg Intravenous BID  .  HYDROmorphone (DILAUDID) injection  0.5 mg Intravenous 4 times per  day  . insulin aspart  0-15 Units Subcutaneous TID WC  . insulin aspart  0-5 Units Subcutaneous QHS  . insulin aspart  4 Units Subcutaneous TID WC  . insulin glargine  10 Units Subcutaneous QHS  . ipratropium-albuterol  3 mL Nebulization TID    . lactulose  10 g Oral TID  . LORazepam  0.5 mg Intravenous BID  . pantoprazole  40 mg Oral Daily  . rifaximin  550 mg Oral BID  . sodium chloride  3 mL Intravenous Q12H  . spironolactone  25 mg Oral Daily   Continuous Infusions:   Principal Problem:   Anasarca Active Problems:   Hepatic cirrhosis (HCC)   Diabetes ([place])   Chronic hepatitis C with cirrhosis (HCC)   Scrotal edema   Thrombocytopenia (HCC)   Morbid obesity (HCC)   Leg pain, lateral   Cellulitis of right lower extremity   Time spent  25 minutes  By signing my name below, I, Rennis Harding attest that this documentation has been prepared under the direction and in the presence of Murray Hodgkins, MD Electronically signed: Rennis Harding  07/05/2015 2:12pm   I personally performed the services described in this documentation. All medical record entries made by the scribe were at my direction. I have reviewed the chart and agree that the record reflects my personal performance and is accurate and complete. Murray Hodgkins, MD

## 2015-07-05 NOTE — Consult Note (Signed)
Urology Consult  Referring physician: Dr. Sarajane Jews Reason for referral: hematuria, scrotal edema  Chief Complaint: scrotal pain  History of Present Illness: Mr [name] is a 54yo with cirrhosis, DMII, and morbid obesity admitted with severe lower extremity edema and COPD exacerbation. He alos was having difficulty urinating in the ER and a foley catheter was placed. Pt has had scrotal edema that has been worsening over the past month. Per the patient he was able to urinate normally until 1 week ago when he became incontinent. He has severe urgency, frequency, dysuria and a feeling of incomplete emptying. No previous hx of gross hematuria until the foley catheter was placed. His scrotal pain is dull, constant, mild and nonradiating. Movement makes the pain worse. NO alleviating events.  Past Medical History  Diagnosis Date  . Hypertension   . Diabetes mellitus   . GERD (gastroesophageal reflux disease)     DEC 2010 EGD/Bx REACTIVE GASTROPATHY, NO VARICES  . Hemorrhoids, internal   . BMI 40.0-44.9, adult ([place]) OCT 2010 269 LBS    APR 2012 279 LBS AUG 2014 185 LBS  . Cirrhosis ([place]) NOV 2010 CHILD PUGH A    ETOH/HCV/OBESITY  . IV drug abuse REMOTE  . Hepatitis 2010 HEP C    AST 509 ALT 267 ALK PHOS 165 ALB 3.8 NEG IGM HAV/HBSAg  . Gallstone AUG 2012 1 CM  . GERD 10/25/2009  . COPD (chronic obstructive pulmonary disease) ([place])   . Hepatitis C   . Pancytopenia ([place]) 2013  . Splenomegaly 2013  . Other pancytopenia ([place]) 02/18/2013  . Iron (Fe) deficiency anemia 02/18/2013  . Splenomegaly 02/18/2013  . Neuropathy ([place])   . Biliary dyskinesia JUL 2015    HIDA GB EF 5%  . Chronic pain   . Chronic leg pain     left  . Portal venous hypertension (HCC)   . Asthma   . Medically noncompliant 05/30/2015   Past Surgical History  Procedure Laterality Date  . Sigmoidoscopy      2001 DR. FLEISCHMAN INTERNAL HERMORRHOIDS  . Upper gastrointestinal endoscopy  DEC 2010    BENIGN POLYPS, GASTRITIS,  ?phg  . Knee surgery  RIGHT  . Hemorrhoid surgery    . Esophageal biopsy  09/08/2011    [name]:Moderate gastritis/Polyps, multiple in the body of the stomach  . Colonoscopy with propofol N/A 11/15/2013    [name]: rectal varices, small AVMs  . Esophagogastroduodenoscopy (egd) with propofol N/A 11/15/2013    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 Next EGD in April 2016.      Medications: I have reviewed the patient's current medications. Allergies:  Allergies  Allergen Reactions  . Penicillins Hives    Has patient had a PCN reaction causing immediate rash, facial/tongue/throat swelling, SOB or lightheadedness with hypotension: Yes Has patient had a PCN reaction causing severe rash involving mucus membranes or skin necrosis: Yes Has patient had a PCN reaction that required hospitalization No Has patient had a PCN reaction occurring within the last 10 years: No If all of the above answers are "NO", then may proceed with Cephalosporin use.     Family History  Problem Relation Age of Onset  . Colon cancer Neg Hx   . Anesthesia problems Neg Hx   . Hypotension Neg Hx   . Malignant hyperthermia Neg Hx   . Pseudochol deficiency Neg Hx   . Cancer Father    Social History:  reports that he  has been smoking Cigarettes.  He has been smoking about 0.50 packs per day. He has never used smokeless tobacco. He reports that he does not drink alcohol or use illicit drugs.  Review of Systems  Constitutional: Positive for malaise/fatigue.  Cardiovascular: Positive for leg swelling.  Genitourinary: Positive for dysuria, urgency, frequency and hematuria.  All other systems reviewed and are negative.   Physical Exam:  Vital signs in last 24 hours: Temp:  [98 F (36.7 C)-98.7 F (37.1 C)] 98.1 F (36.7 C) (11/24 1515) Pulse Rate:  [88-91] 90 (11/24 1515) Resp:  [20] 20 (11/24 1515) BP: (134-152)/(57-71) 152/71 mmHg (11/24  1515) SpO2:  [93 %-100 %] 96 % (11/24 1515) FiO2 (%):  [21 %] 21 % (11/23 1714) Weight:  [154 kg (339 lb 8.1 oz)] 154 kg (339 lb 8.1 oz) (11/24 8786) Physical Exam  Constitutional: He is oriented to person, place, and time. He appears well-developed and well-nourished.  HENT:  Head: Normocephalic and atraumatic.  Eyes: EOM are normal. Pupils are equal, round, and reactive to light.  Neck: Normal range of motion. No thyromegaly present.  Cardiovascular: Normal rate and regular rhythm.   Respiratory: Effort normal. No respiratory distress.  GI: Soft. He exhibits no distension.  Genitourinary: Right testis shows swelling and tenderness. Left testis shows swelling and tenderness.  Penis retracted due to scrotal edema. 16 french foley in place. Severe scrotal edema with mild tenderness on palpation.   Musculoskeletal: Normal range of motion.  Neurological: He is alert and oriented to person, place, and time.  Skin: Skin is warm and dry.  Psychiatric: He has a normal mood and affect. His behavior is normal. Judgment and thought content normal.    Laboratory Data:  Results for orders placed or performed during the hospital encounter of 07/02/15 (from the past 72 hour(s))  Glucose, capillary     Status: Abnormal   Collection Time: 07/02/15  4:25 PM  Result Value Ref Range   Glucose-Capillary 109 (H) 65 - 99 mg/dL   Comment 1 Notify RN    Comment 2 Document in Chart   Glucose, capillary     Status: Abnormal   Collection Time: 07/02/15  8:57 PM  Result Value Ref Range   Glucose-Capillary 163 (H) 65 - 99 mg/dL   Comment 1 Notify RN    Comment 2 Document in Chart   Comprehensive metabolic panel     Status: Abnormal   Collection Time: 07/03/15  6:34 AM  Result Value Ref Range   Sodium 139 135 - 145 mmol/L   Potassium 4.1 3.5 - 5.1 mmol/L   Chloride 104 101 - 111 mmol/L   CO2 26 22 - 32 mmol/L   Glucose, Bld 80 65 - 99 mg/dL   BUN 36 (H) 6 - 20 mg/dL   Creatinine, Ser 2.12 (H) 0.61 -  1.24 mg/dL   Calcium 8.6 (L) 8.9 - 10.3 mg/dL   Total Protein 6.7 6.5 - 8.1 g/dL   Albumin 2.4 (L) 3.5 - 5.0 g/dL   AST 235 (H) 15 - 41 U/L   ALT 72 (H) 17 - 63 U/L   Alkaline Phosphatase 129 (H) 38 - 126 U/L   Total Bilirubin 2.1 (H) 0.3 - 1.2 mg/dL   GFR calc non Af Amer 34 (L) >60 mL/min   GFR calc Af Amer 39 (L) >60 mL/min    Comment: (NOTE) The eGFR has been calculated using the CKD EPI equation. This calculation has not been validated in all clinical situations.  eGFR's persistently <60 mL/min signify possible Chronic Kidney Disease.    Anion gap 9 5 - 15  CBC     Status: Abnormal   Collection Time: 07/03/15  6:34 AM  Result Value Ref Range   WBC 9.3 4.0 - 10.5 K/uL   RBC 3.54 (L) 4.22 - 5.81 MIL/uL   Hemoglobin 11.3 (L) 13.0 - 17.0 g/dL   HCT 77.0 (L) 73.9 - 51.4 %   MCV 96.9 78.0 - 100.0 fL   MCH 31.9 26.0 - 34.0 pg   MCHC 32.9 30.0 - 36.0 g/dL   RDW 63.9 80.8 - 14.5 %   Platelets 99 (L) 150 - 400 K/uL    Comment: SPECIMEN CHECKED FOR CLOTS CONSISTENT WITH PREVIOUS RESULT   Glucose, capillary     Status: None   Collection Time: 07/03/15  7:55 AM  Result Value Ref Range   Glucose-Capillary 83 65 - 99 mg/dL  Glucose, capillary     Status: Abnormal   Collection Time: 07/03/15 11:10 AM  Result Value Ref Range   Glucose-Capillary 147 (H) 65 - 99 mg/dL  Glucose, capillary     Status: Abnormal   Collection Time: 07/03/15  4:07 PM  Result Value Ref Range   Glucose-Capillary 111 (H) 65 - 99 mg/dL  Glucose, capillary     Status: Abnormal   Collection Time: 07/03/15  9:41 PM  Result Value Ref Range   Glucose-Capillary 117 (H) 65 - 99 mg/dL   Comment 1 Notify RN    Comment 2 Document in Chart   Basic metabolic panel     Status: Abnormal   Collection Time: 07/04/15  5:50 AM  Result Value Ref Range   Sodium 135 135 - 145 mmol/L   Potassium 4.2 3.5 - 5.1 mmol/L   Chloride 102 101 - 111 mmol/L   CO2 28 22 - 32 mmol/L   Glucose, Bld 81 65 - 99 mg/dL   BUN 33 (H) 6 - 20  mg/dL   Creatinine, Ser 4.62 (H) 0.61 - 1.24 mg/dL   Calcium 8.2 (L) 8.9 - 10.3 mg/dL   GFR calc non Af Amer 37 (L) >60 mL/min   GFR calc Af Amer 42 (L) >60 mL/min    Comment: (NOTE) The eGFR has been calculated using the CKD EPI equation. This calculation has not been validated in all clinical situations. eGFR's persistently <60 mL/min signify possible Chronic Kidney Disease.    Anion gap 5 5 - 15  CBC     Status: Abnormal   Collection Time: 07/04/15  5:50 AM  Result Value Ref Range   WBC 5.6 4.0 - 10.5 K/uL   RBC 3.19 (L) 4.22 - 5.81 MIL/uL   Hemoglobin 10.4 (L) 13.0 - 17.0 g/dL   HCT 01.2 (L) 03.7 - 92.8 %   MCV 96.2 78.0 - 100.0 fL   MCH 32.6 26.0 - 34.0 pg   MCHC 33.9 30.0 - 36.0 g/dL   RDW 62.8 70.4 - 82.4 %   Platelets 58 (L) 150 - 400 K/uL    Comment: SPECIMEN CHECKED FOR CLOTS CONSISTENT WITH PREVIOUS RESULT   Glucose, capillary     Status: None   Collection Time: 07/04/15  7:43 AM  Result Value Ref Range   Glucose-Capillary 84 65 - 99 mg/dL   Comment 1 Notify RN    Comment 2 Document in Chart   Glucose, capillary     Status: Abnormal   Collection Time: 07/04/15 11:29 AM  Result Value Ref Range  Glucose-Capillary 125 (H) 65 - 99 mg/dL   Comment 1 Notify RN    Comment 2 Document in Chart   Glucose, capillary     Status: Abnormal   Collection Time: 07/04/15  5:06 PM  Result Value Ref Range   Glucose-Capillary 124 (H) 65 - 99 mg/dL  Glucose, capillary     Status: None   Collection Time: 07/04/15 10:04 PM  Result Value Ref Range   Glucose-Capillary 90 65 - 99 mg/dL  Basic metabolic panel     Status: Abnormal   Collection Time: 07/05/15  5:55 AM  Result Value Ref Range   Sodium 134 (L) 135 - 145 mmol/L   Potassium 4.4 3.5 - 5.1 mmol/L   Chloride 100 (L) 101 - 111 mmol/L   CO2 27 22 - 32 mmol/L   Glucose, Bld 103 (H) 65 - 99 mg/dL   BUN 40 (H) 6 - 20 mg/dL   Creatinine, Ser 2.02 (H) 0.61 - 1.24 mg/dL   Calcium 8.1 (L) 8.9 - 10.3 mg/dL   GFR calc non Af  Amer 36 (L) >60 mL/min   GFR calc Af Amer 42 (L) >60 mL/min    Comment: (NOTE) The eGFR has been calculated using the CKD EPI equation. This calculation has not been validated in all clinical situations. eGFR's persistently <60 mL/min signify possible Chronic Kidney Disease.    Anion gap 7 5 - 15  Magnesium     Status: Abnormal   Collection Time: 07/05/15  5:55 AM  Result Value Ref Range   Magnesium 1.4 (L) 1.7 - 2.4 mg/dL  Glucose, capillary     Status: Abnormal   Collection Time: 07/05/15  7:51 AM  Result Value Ref Range   Glucose-Capillary 103 (H) 65 - 99 mg/dL  Glucose, capillary     Status: Abnormal   Collection Time: 07/05/15 11:58 AM  Result Value Ref Range   Glucose-Capillary 140 (H) 65 - 99 mg/dL   No results found for this or any previous visit (from the past 240 hour(s)). Creatinine:  Recent Labs  07/02/15 1005 07/03/15 0634 07/04/15 0550 07/05/15 0555  CREATININE 2.22* 2.12* 1.99* 2.02*   Baseline Creatinine: unknown  Impression/Assessment:  54yo with scrotal edema, urinary retention, gross hematuria, and elevated creatinine  Recs: 1. Urinary retention: Continue foley catheter to straight drain. I discussed leaving the foley in until the edemahas resolved and the patient agrees 2. Gross hematuria: likely due to catheter placement and urine is currently clear 3. Elevated Creatinine: Continue diuresis as this is likely related to hepatorenal syndrome 4. Scrotal edema: likely related to cirrhosis. I discussed scrotal elevation with the patient and he agrees with this treatment. Continue diuresis as this wll assist in resolving the scrotal edema  Kalayah Leske L 07/05/2015, 3:18 PM

## 2015-07-05 NOTE — Progress Notes (Signed)
Patient's scrotum remains edematous today, appears to be weeping more.  Still small amount of bloody drainage leaking around foley as well.  Foley care performed.  Urine orange in color.

## 2015-07-06 LAB — BASIC METABOLIC PANEL
Anion gap: 7 (ref 5–15)
BUN: 42 mg/dL — ABNORMAL HIGH (ref 6–20)
CHLORIDE: 101 mmol/L (ref 101–111)
CO2: 26 mmol/L (ref 22–32)
Calcium: 8.4 mg/dL — ABNORMAL LOW (ref 8.9–10.3)
Creatinine, Ser: 2.13 mg/dL — ABNORMAL HIGH (ref 0.61–1.24)
GFR calc Af Amer: 39 mL/min — ABNORMAL LOW (ref >60)
GFR calc non Af Amer: 34 mL/min — ABNORMAL LOW (ref >60)
GLUCOSE: 168 mg/dL — AB (ref 65–99)
POTASSIUM: 4.6 mmol/L (ref 3.5–5.1)
Sodium: 134 mmol/L — ABNORMAL LOW (ref 135–145)

## 2015-07-06 LAB — MAGNESIUM: Magnesium: 1.7 mg/dL (ref 1.7–2.4)

## 2015-07-06 LAB — GLUCOSE, CAPILLARY
GLUCOSE-CAPILLARY: 109 mg/dL — AB (ref 65–99)
GLUCOSE-CAPILLARY: 118 mg/dL — AB (ref 65–99)
GLUCOSE-CAPILLARY: 88 mg/dL (ref 65–99)
Glucose-Capillary: 169 mg/dL — ABNORMAL HIGH (ref 65–99)

## 2015-07-06 MED ORDER — ALBUMIN HUMAN 25 % IV SOLN
50.0000 g | Freq: Every day | INTRAVENOUS | Status: DC
  Administered 2015-07-07 – 2015-07-17 (×11): 50 g via INTRAVENOUS
  Filled 2015-07-06 (×12): qty 200

## 2015-07-06 MED ORDER — FUROSEMIDE 10 MG/ML IJ SOLN: 40.0000 mg | Freq: Every day | INTRAMUSCULAR | Status: DC

## 2015-07-06 MED ORDER — SPIRONOLACTONE 25 MG PO TABS: 25.0000 mg | ORAL_TABLET | Freq: Every day | ORAL | Status: DC

## 2015-07-06 NOTE — Care Management Note (Signed)
Case Management Note  Patient Details  Name: [name] MRN: [id number] Date of Birth: 1961/05/31  Subjective/Objective:                    Action/Plan:   Expected Discharge Date:  07/06/15               Expected Discharge Plan:  [place]  In-House Referral:  NA  Discharge planning Services  CM Consult  Post Acute Care Choice:  Resumption of Svcs/PTA Provider Choice offered to:  Patient  DME Arranged:    DME Agency:  [place]:  RN [facility] Agency:     Status of Service:  Completed, signed off  Medicare Important Message Given:  Yes Date Medicare IM Given:    Medicare IM give by:    Date Additional Medicare IM Given:    Additional Medicare Important Message give by:     If discussed at [place] of Stay Meetings, dates discussed:    Additional Comments: Pt still requiring IV lasix and albumin for edema. Anticipate hospitalization over the weekend for continued diuresis. [name] [place], RN 07/06/2015, 12:55 PM

## 2015-07-06 NOTE — Progress Notes (Signed)
PROGRESS NOTE  [name] [id number] DOB: 04-Oct-1960 DOA: 07/02/2015 PCP: [name], MD  Summary: [id number] PMH alcoholic cirrhosis, hepatitis C, recent 15 day hospitalization for RLE cellulitis, presented with anasarca. Admitted for anasarca secondary to cirrhosis for IV diuresis.   Assessment/Plan: 1. Anasarca, secondary to cirrhosis complicated by diastolic dysfunction. Excellent UOP, down 10+ L since admission. Renal function stable.  2. 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 3. Acute diastolic CHF. Improving with lasix and diuresis.  4. AKI,  stable, improved over last discharge. Suspect component of CKD.  5. RLE wound, skin changes stable. No evidence of infection. 6. DM type 2, remains stable.  7. COPD, remains stable.  8. Anemia of chronic disease, stable. 9. Recent hospitalization for cellulitis RLE.   Overall slowly improving but still has evidence of massive volume overload  Continue IV Lasix, albumin per GI; follow daily BMP.   Code Status: full code DVT prophylaxis: SCDs Family Communication: Wife, aunt, and cousin bedside. Discussed with patient who understands and has no concerns at this time. Disposition Plan: Discharge home once improved.   Murray Hodgkins, MD  [facility]  Pager [phone] If 7PM-7AM, please contact night-coverage at [email or web address], password [facility] 07/06/2015, 8:10 AM  LOS: 4 days   Consultants:  GI  Procedures:  Echo Study Conclusions  - Left ventricle: The cavity size was normal. Wall thickness was increased in a pattern of severe LVH. Systolic function was normal. The estimated ejection fraction was in the range of 60% to 65%. Wall motion was normal; there were no regional wall motion abnormalities. Doppler parameters are consistent with abnormal left ventricular relaxation (grade 1  diastolic dysfunction). - Aortic valve: Mildly calcified annulus. Trileaflet; mildly thickened leaflets. Valve area (VTI): 2.54 cm^2. Valve area (Vmax): 2.48 cm^2. - Technically difficult study. Definity echocontrast was used to enhance visualization.  Antibiotics:    HPI/Subjective: Breathing ok, eating ok. Less leg pain; some redness of legs but less edema. Still significant edema of scrotum and abdomen.  Objective: Filed Vitals:   07/05/15 1521 07/05/15 2039 07/06/15 0403 07/06/15 0714  BP:  172/58 152/70   Pulse:  104 105   Temp:  98.1 F (36.7 C) 98.1 F (36.7 C)   TempSrc:  Oral Oral   Resp:  20 20   Height:      Weight:   133 kg (293 lb 3.4 oz)   SpO2: 98% 92% 97% 97%    Intake/Output Summary (Last 24 hours) at 07/06/15 0810 Last data filed at 07/06/15 0223  Gross per 24 hour  Intake    600 ml  Output   2525 ml  Net  -1925 ml     Filed Weights   07/04/15 0609 07/05/15 0712 07/06/15 0403  Weight: 159.9 kg (352 lb 8.3 oz) 154 kg (339 lb 8.1 oz) 133 kg (293 lb 3.4 oz)    Exam:  Afebrile, VSS General:  Appears calm and comfortable Cardiovascular: RRR, no m/r/g. 3+ BLE edema, improved. Massive scrotal edema persists. Pitting abd edema persists.  Respiratory: CTA bilaterally, no w/r/r. Normal respiratory effort. Abdomen: obese Skin: chronic erythema RLE; some erythema left foot. Musculoskeletal: grossly normal tone BUE/BLE Psychiatric: grossly normal mood and affect, speech fluent and appropriate Neurologic: grossly non-focal.  New data reviewed:  UOP 3255  -10 L since admission  CBG stable  BUN slightly higher, 42, creatinine stable at 2.13  Magnesium WNL  Scheduled Meds: . albumin human  50 g Intravenous  BID  . furosemide  40 mg Intravenous BID  .  HYDROmorphone (DILAUDID) injection  0.5 mg Intravenous 4 times per day  . insulin aspart  0-15 Units Subcutaneous TID WC  . insulin aspart  0-5 Units Subcutaneous QHS  . insulin aspart  4  Units Subcutaneous TID WC  . insulin glargine  10 Units Subcutaneous QHS  . ipratropium-albuterol  3 mL Nebulization TID  . lactulose  20 g Oral TID  . LORazepam  0.5 mg Intravenous BID  . pantoprazole  40 mg Oral Daily  . rifaximin  550 mg Oral BID  . sodium chloride  3 mL Intravenous Q12H  . spironolactone  25 mg Oral Daily   Continuous Infusions:   Principal Problem:   Anasarca Active Problems:   Hepatic cirrhosis (HCC)   Diabetes ([place])   Chronic hepatitis C with cirrhosis (HCC)   Scrotal edema   Thrombocytopenia (HCC)   Morbid obesity (HCC)   Leg pain, lateral   Cellulitis of right lower extremity   Time spent 15 minutes  By signing my name below, I, Rennis Harding attest that this documentation has been prepared under the direction and in the presence of Murray Hodgkins, MD Electronically signed: Rennis Harding  07/06/2015   I personally performed the services described in this documentation. All medical record entries made by the scribe were at my direction. I have reviewed the chart and agree that the record reflects my personal performance and is accurate and complete. Murray Hodgkins, MD

## 2015-07-06 NOTE — Care Management Important Message (Signed)
Important Message  Patient Details  Name: [name] MRN: [id number] Date of Birth: 1960/11/18   Medicare Important Message Given:  Yes    Joylene Draft, RN 07/06/2015, 12:55 PM

## 2015-07-06 NOTE — Progress Notes (Signed)
Patient ID: [name], male   DOB: Apr 14, 1961, 54 y.o.   MRN: [id number]   Assessment/Plan: ADMITTED WITH ANASARCA.CLINICALLY IMPROVED. WEIGHT DOWN/Cr UP SLIGHTLY.   PLAN: 1. HOLD EVENING ALBUMIN/LASIX THIS EVENING.  [place] 26. LAST ALBUMIN/LASIX NOV 26. 2. CONTINUE LACTULOSE AND XIFAXAN. 3. CONTINUE TO MONITOR SYMPTOMS. 4. CONSIDER UROLOGY EVAL OF FOLEY    Subjective: Since I last evaluated the patient HE CONTINUES TO DIURESE. HAVING PAIN/PRESSURE WITH URINATION  IN SPITE OF FOLEY.  Objective: Vital signs in last 24 hours: Filed Vitals:   07/06/15 0403 07/06/15 0858  BP: 152/70 134/56  Pulse: 105 85  Temp: 98.1 F (36.7 C) 97.5 F (36.4 C)  Resp: 20 12   General appearance: alert and cooperative Resp: clear to auscultation bilaterally Cardio: regular rate and rhythm GI: soft, non-tender; bowel sounds normal; no masses,  no organomegaly Extremities: edema 3-4+  GROIN:clear urine draining out of the edematous meatus  Lab Results:  K 4.6 Cr 2.13 Mg 1.7  Studies/Results: No results found.  Medications: I have reviewed the patient's current medications.   LOS: 5 days   Barney Drain 01/19/2014, 2:23 PM

## 2015-07-07 LAB — BASIC METABOLIC PANEL
Anion gap: 7 (ref 5–15)
BUN: 40 mg/dL — AB (ref 6–20)
CHLORIDE: 103 mmol/L (ref 101–111)
CO2: 26 mmol/L (ref 22–32)
CREATININE: 2.01 mg/dL — AB (ref 0.61–1.24)
Calcium: 8.2 mg/dL — ABNORMAL LOW (ref 8.9–10.3)
GFR calc Af Amer: 42 mL/min — ABNORMAL LOW (ref >60)
GFR calc non Af Amer: 36 mL/min — ABNORMAL LOW (ref >60)
GLUCOSE: 127 mg/dL — AB (ref 65–99)
Potassium: 4.2 mmol/L (ref 3.5–5.1)
Sodium: 136 mmol/L (ref 135–145)

## 2015-07-07 LAB — GLUCOSE, CAPILLARY
GLUCOSE-CAPILLARY: 109 mg/dL — AB (ref 65–99)
Glucose-Capillary: 115 mg/dL — ABNORMAL HIGH (ref 65–99)
Glucose-Capillary: 148 mg/dL — ABNORMAL HIGH (ref 65–99)
Glucose-Capillary: 119 mg/dL — ABNORMAL HIGH (ref 65–99)

## 2015-07-07 LAB — MAGNESIUM: Magnesium: 1.7 mg/dL (ref 1.7–2.4)

## 2015-07-07 MED ORDER — FUROSEMIDE 20 MG PO TABS: 20.0000 mg | ORAL_TABLET | Freq: Every day | ORAL | Status: DC

## 2015-07-07 MED ORDER — SPIRONOLACTONE 25 MG PO TABS
50.0000 mg | ORAL_TABLET | Freq: Every day | ORAL | Status: DC
  Administered 2015-07-08 – 2015-07-17 (×10): 50 mg via ORAL
  Filled 2015-07-07 (×10): qty 2

## 2015-07-07 MED ORDER — LACTULOSE 10 GM/15ML PO SOLN
10.0000 g | Freq: Two times a day (BID) | ORAL | Status: DC
  Administered 2015-07-07 – 2015-07-08 (×2): 10 g via ORAL
  Filled 2015-07-07 (×2): qty 30

## 2015-07-07 MED ORDER — FUROSEMIDE 10 MG/ML IJ SOLN
40.0000 mg | Freq: Two times a day (BID) | INTRAMUSCULAR | Status: DC
  Administered 2015-07-07 – 2015-07-16 (×19): 40 mg via INTRAVENOUS
  Filled 2015-07-07 (×19): qty 4

## 2015-07-07 NOTE — Progress Notes (Signed)
PROGRESS NOTE  [name] [id number] DOB: Mar 26, 1961 DOA: 07/02/2015 PCP: [name], MD  Summary: [id number] PMH alcoholic cirrhosis, hepatitis C, recent 15 day hospitalization for RLE cellulitis, presented with anasarca. Admitted for anasarca secondary to cirrhosis for IV diuresis.   Assessment/Plan: 1. Anasarca, secondary to cirrhosis complicated by diastolic dysfunction.continues to slowly improve; down nearly 10 L. 2. 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 GI following. Continue spironolactone. 3. Acute diastolic CHF. Improving with diuresis.  4. AKI,  stable, remains improved over last discharge. Suspect component of CKD. Continue to monitor in the setting of diuresis.  5. RLE wound, skin changes stable. No evidence of infection. 6. DM type 2, remains stable.  7. COPD, remains stable.  8. Anemia of chronic disease, stable. 9. Recent hospitalization for cellulitis RLE.   Overall improving but still has evidence of massive volume overload.  Continue daily BMP, likely will need several more days of diuresis.   Appreciate GI recs.   Code Status: Full code DVT prophylaxis: SCDs Family Communication: No family at bedside. Discussed with patient who understands and has no concerns at this time. Disposition Plan: Discharge home once improved.   Murray Hodgkins, MD  [facility]  Pager [phone] If 7PM-7AM, please contact night-coverage at [email or web address], password [email or web address] 07/07/2015, 7:19 AM  LOS: 5 days   Consultants:  GI  Procedures:  Echo Study Conclusions  - Left ventricle: The cavity size was normal. Wall thickness was increased in a pattern of severe LVH. Systolic function was normal. The estimated ejection fraction was in the range of 60% to 65%. Wall motion was normal; there were no regional wall motion abnormalities. Doppler parameters are  consistent with abnormal left ventricular relaxation (grade 1 diastolic dysfunction). - Aortic valve: Mildly calcified annulus. Trileaflet; mildly thickened leaflets. Valve area (VTI): 2.54 cm^2. Valve area (Vmax): 2.48 cm^2. - Technically difficult study. Definity echocontrast was used to enhance visualization.  Antibiotics:    HPI/Subjective: Feels better, swelling is going down. Has a good appetite. Breathing ok.  Objective: Filed Vitals:   07/06/15 1427 07/06/15 1500 07/06/15 2100 07/07/15 0426  BP:  124/62  133/65  Pulse:  72  89  Temp:  98.6 F (37 C)  98.3 F (36.8 C)  TempSrc:  Oral  Oral  Resp:  15  20  Height:      Weight:      SpO2: 100% 100% 97% 95%    Intake/Output Summary (Last 24 hours) at 07/07/15 0719 Last data filed at 07/07/15 0321  Gross per 24 hour  Intake   1502 ml  Output   1150 ml  Net    352 ml     Filed Weights   07/04/15 0609 07/05/15 0712 07/06/15 0403  Weight: 159.9 kg (352 lb 8.3 oz) 154 kg (339 lb 8.1 oz) 133 kg (293 lb 3.4 oz)    Exam:  Afebrile, VSS General:  Appears calm and comfortable Cardiovascular: RRR, no m/r/g. 3+ BLE edema however there appears to be improvement with some wrinkling of the skin on feet Respiratory: CTA bilaterally, no w/r/r. Normal respiratory effort. Abdomen: soft, less body wall edema, scrotum appears slightly better Skin: chronic erythema RLE; some erythema left foot; stable.  Psychiatric: grossly normal mood and affect, speech fluent and appropriate Neurologic: grossly non-focal.  New data reviewed:  -9.9 L since admission  CBG stable  BUN 40, creatinine stable at 2.01   Scheduled Meds: . albumin human  50 g  Intravenous Daily  . furosemide  40 mg Intravenous Daily  . insulin aspart  0-15 Units Subcutaneous TID WC  . insulin aspart  0-5 Units Subcutaneous QHS  . insulin aspart  4 Units Subcutaneous TID WC  . insulin glargine  10 Units Subcutaneous QHS  . ipratropium-albuterol   3 mL Nebulization TID  . lactulose  20 g Oral TID  . pantoprazole  40 mg Oral Daily  . rifaximin  550 mg Oral BID  . sodium chloride  3 mL Intravenous Q12H  . [START ON 07/09/2015] spironolactone  25 mg Oral Daily   Continuous Infusions:   Principal Problem:   Anasarca Active Problems:   Hepatic cirrhosis (HCC)   Diabetes ([place])   Chronic hepatitis C with cirrhosis (HCC)   Scrotal edema   Thrombocytopenia (HCC)   Morbid obesity (HCC)   Leg pain, lateral   Cellulitis of right lower extremity   Time spent 15 minutes  By signing my name below, I, Rosalie Doctor attest that this documentation has been prepared under the direction and in the presence of Murray Hodgkins, MD Electronically signed: Rosalie Doctor, Scribe. 07/07/2015 1:27pm  I personally performed the services described in this documentation. All medical record entries made by the scribe were at my direction. I have reviewed the chart and agree that the record reflects my personal performance and is accurate and complete. Murray Hodgkins, MD

## 2015-07-07 NOTE — Progress Notes (Addendum)
Patient ID: [name], male   DOB: 1960/11/28, 54 y.o.   MRN: [id number]   Assessment/Plan: ADMITTED WITH ANASARCA.CLINICALLY IMPROVED. WEIGHT STABLE(193 LBS)/Cr IMPROVED ON DAY 3 ALBUMIN/LASIX. FREQUENT BMs ON LACTULOSE.  PLAN: 1. CUT LACTULOSE DOSE IN HALF 2. CONTINUE XIFAXAN AND PROTONIX. 3. START LASIX 20 MG DAILY AND ALDACTONE 50 MG DAILY ON NOV 27   Subjective: Since I last evaluated the patient PT IS C/O RIGHT FLANK PAIN. SWELLING IN LEGS IMPROVED. URINE DRAINING AROUND GROIN IS STILL AN ISSUE.  TOLERATING POs. FREQUENT SOFT BMs.  Objective: Vital signs in last 24 hours: Filed Vitals:   07/06/15 1500 07/07/15 0426  BP: 124/62 133/65  Pulse: 72 89  Temp: 98.6 F (37 C) 98.3 F (36.8 C)  Resp: 15 20   General appearance: alert, cooperative and no distress Resp: clear to auscultation bilaterally Cardio: regular rate and rhythm GI: soft, non-tender; bowel sounds normal; no masses,  no organomegaly Extremities: edema 2-3+  Lab Results:  K. 4.2 Cr 2.01 Mg 1.7   Studies/Results: No results found.  Medications: I have reviewed the patient's current medications.   LOS: 5 days   Barney Drain 01/19/2014, 2:23 PM

## 2015-07-08 DIAGNOSIS — Z794 Long term (current) use of insulin: Secondary | ICD-10-CM

## 2015-07-08 DIAGNOSIS — E119 Type 2 diabetes mellitus without complications: Secondary | ICD-10-CM

## 2015-07-08 LAB — GLUCOSE, CAPILLARY
GLUCOSE-CAPILLARY: 130 mg/dL — AB (ref 65–99)
GLUCOSE-CAPILLARY: 116 mg/dL — AB (ref 65–99)
Glucose-Capillary: 127 mg/dL — ABNORMAL HIGH (ref 65–99)
Glucose-Capillary: 96 mg/dL (ref 65–99)

## 2015-07-08 LAB — CBC
HCT: 27.7 % — ABNORMAL LOW (ref 39.0–52.0)
Hemoglobin: 9.3 g/dL — ABNORMAL LOW (ref 13.0–17.0)
MCH: 32.1 pg (ref 26.0–34.0)
MCHC: 33.6 g/dL (ref 30.0–36.0)
MCV: 95.5 fL (ref 78.0–100.0)
PLATELETS: 50 10*3/uL — AB (ref 150–400)
RBC: 2.9 MIL/uL — AB (ref 4.22–5.81)
RDW: 14.5 % (ref 11.5–15.5)
WBC: 4.2 10*3/uL (ref 4.0–10.5)

## 2015-07-08 LAB — BASIC METABOLIC PANEL
Anion gap: 6 (ref 5–15)
BUN: 40 mg/dL — ABNORMAL HIGH (ref 6–20)
CHLORIDE: 103 mmol/L (ref 101–111)
CO2: 27 mmol/L (ref 22–32)
CREATININE: 2.04 mg/dL — AB (ref 0.61–1.24)
Calcium: 8.2 mg/dL — ABNORMAL LOW (ref 8.9–10.3)
GFR, EST AFRICAN AMERICAN: 41 mL/min — AB (ref >60)
GFR, EST NON AFRICAN AMERICAN: 36 mL/min — AB (ref >60)
Glucose, Bld: 153 mg/dL — ABNORMAL HIGH (ref 65–99)
Potassium: 4.3 mmol/L (ref 3.5–5.1)
SODIUM: 136 mmol/L (ref 135–145)

## 2015-07-08 LAB — MAGNESIUM: Magnesium: 1.7 mg/dL (ref 1.7–2.4)

## 2015-07-08 MED ORDER — LACTULOSE 10 GM/15ML PO SOLN
10.0000 g | Freq: Every day | ORAL | Status: DC
  Administered 2015-07-09 – 2015-07-16 (×8): 10 g via ORAL
  Filled 2015-07-08 (×9): qty 30

## 2015-07-08 MED ORDER — LORAZEPAM 0.5 MG PO TABS
0.5000 mg | ORAL_TABLET | Freq: Four times a day (QID) | ORAL | Status: DC | PRN
  Administered 2015-07-08 – 2015-07-10 (×6): 0.5 mg via ORAL
  Filled 2015-07-08 (×6): qty 1

## 2015-07-08 MED ORDER — IPRATROPIUM-ALBUTEROL 0.5-2.5 (3) MG/3ML IN SOLN: 3.0000 mL | Freq: Three times a day (TID) | RESPIRATORY_TRACT | Status: DC | PRN

## 2015-07-08 MED ORDER — LORAZEPAM 0.5 MG PO TABS: 0.5000 mg | ORAL_TABLET | Freq: Two times a day (BID) | ORAL | Status: DC

## 2015-07-08 NOTE — Progress Notes (Signed)
PROGRESS NOTE  [name] [id number] DOB: Dec 05, 1960 DOA: 07/02/2015 PCP: [name], MD  Summary: [id number] PMH alcoholic cirrhosis, hepatitis C, recent 15 day hospitalization for RLE cellulitis, presented with anasarca. Admitted for anasarca secondary to cirrhosis for IV diuresis.   Assessment/Plan: 1. Anasarca, secondary to cirrhosis complicated by acute diastolic CHF. Continues to improve slowly, down >11 L since admission. 2. 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 Improving slowly. GI following. Continue spironolactone. 3. Acute diastolic CHF. Improving with diuresis.  4. AKI, stable, remains improved over last discharge. Suspect component of CKD. Continue to monitor in the setting of diuresis.  5. RLE wound, skin changes stable.   6. DM type 2, stable.  7. COPD, stable.  8. Anemia of chronic disease, remains stable. 9. Recent hospitalization for cellulitis RLE.   Overall improving but still has evidence of massive volume overload.  Continue daily BMP, likely will need several more days of diuresis.   Appreciate GI recs.   Code Status: Full code DVT prophylaxis: SCDs Family Communication: No family at bedside. Discussed with patient who understands and has no concerns at this time. Disposition Plan: Discharge home once improved.   Murray Hodgkins, MD  [facility]  Pager [phone] If 7PM-7AM, please contact night-coverage at [email or web address], password [facility] 07/08/2015, 7:08 AM  LOS: 6 days   Consultants:  GI  urology  Procedures:  Echo Study Conclusions  - Left ventricle: The cavity size was normal. Wall thickness was increased in a pattern of severe LVH. Systolic function was normal. The estimated ejection fraction was in the range of 60% to 65%. Wall motion was normal; there were no regional wall motion abnormalities. Doppler parameters  are consistent with abnormal left ventricular relaxation (grade 1 diastolic dysfunction). - Aortic valve: Mildly calcified annulus. Trileaflet; mildly thickened leaflets. Valve area (VTI): 2.54 cm^2. Valve area (Vmax): 2.48 cm^2. - Technically difficult study. Definity echocontrast was used to enhance visualization.  Antibiotics:    HPI/Subjective: Feels good. Redness and swelling to his groin and RLE is improving. Has been having frequent BMs. Has some right sided abdominal soreness.  Objective: Filed Vitals:   07/07/15 1445 07/07/15 2036 07/07/15 2158 07/08/15 0650  BP: 174/65 125/53  124/60  Pulse: 111 115  83  Temp: 98.1 F (36.7 C) 99.1 F (37.3 C)  97.6 F (36.4 C)  TempSrc: Oral Oral  Oral  Resp: 20 20  20   Height:      Weight:    135 kg (297 lb 9.9 oz)  SpO2: 99% 95% 93% 100%    Intake/Output Summary (Last 24 hours) at 07/08/15 0708 Last data filed at 07/07/15 2007  Gross per 24 hour  Intake    440 ml  Output   2000 ml  Net  -1560 ml     Filed Weights   07/06/15 0403 07/07/15 0426 07/08/15 0650  Weight: 133 kg (293 lb 3.4 oz) 132 kg (291 lb 0.1 oz) 135 kg (297 lb 9.9 oz)    Exam:  Afebrile, VSS General:  Appears calm and comfortable Cardiovascular: RRR, no m/r/g. 2-3+ BLE edema however improved Respiratory: CTA bilaterally, no w/r/r. Normal respiratory effort. Abdomen: soft, less body wall edema, scrotum has much less erythema and less edema however still quite edematous.  Skin: BLE appear unchanged; no evidence of infection Psychiatric: grossly normal mood and affect, speech fluent and appropriate Neurologic: grossly non-focal.  New data reviewed:  UOP 2000  -11.4 L since admission  Weights widely variable, do not correlate with UOP  CBG stable  BUN and creatinine without significant change. Potassium normal  Hgb stable 9.3, platelets 50   Scheduled Meds: . albumin human  50 g Intravenous Daily  . furosemide  40 mg  Intravenous BID  . furosemide  20 mg Oral Daily  . insulin aspart  0-15 Units Subcutaneous TID WC  . insulin aspart  0-5 Units Subcutaneous QHS  . insulin aspart  4 Units Subcutaneous TID WC  . insulin glargine  10 Units Subcutaneous QHS  . lactulose  10 g Oral BID  . pantoprazole  40 mg Oral Daily  . rifaximin  550 mg Oral BID  . sodium chloride  3 mL Intravenous Q12H  . spironolactone  50 mg Oral Daily   Continuous Infusions:   Principal Problem:   Anasarca Active Problems:   Hepatic cirrhosis (HCC)   Diabetes ([place])   Chronic hepatitis C with cirrhosis (HCC)   Scrotal edema   Thrombocytopenia (HCC)   Morbid obesity (HCC)   Leg pain, lateral   Cellulitis of right lower extremity   Time spent 15 minutes  By signing my name below, I, Rosalie Doctor attest that this documentation has been prepared under the direction and in the presence of Murray Hodgkins, MD Electronically signed: Rosalie Doctor, Scribe. 07/08/2015 11:23am  I personally performed the services described in this documentation. All medical record entries made by the scribe were at my direction. I have reviewed the chart and agree that the record reflects my personal performance and is accurate and complete. Murray Hodgkins, MD

## 2015-07-08 NOTE — Progress Notes (Signed)
Patient request a nerve pill. Dr. Sarajane Jews notified.

## 2015-07-08 NOTE — Progress Notes (Signed)
Patient ID: [name], male   DOB: [birth year], 54 y.o.   MRN: [id number]   Assessment/Plan: ADMITTED WITH ANASARCA/HCV CIRRHOSIS.CLINICALLY IMPROVED. WEIGHT UP SLIGHTLY(4 LBS). Cr STABLE.  PLAN: 1. CONTINUE ALDACTONE. 2. CONTINUE [place]. 3. MONITOR Cr/K. ALBUMIN/LASIX PER PCP. 4. ATIVAN 0.5 MG BID   Subjective: Since I last evaluated the patient C/O SEVERE GROIN PAIN WITH MOVEMENT. WANTS SOMETHING FOR ANXIETY. BOWELS ARE MOVING.   Objective: Vital signs in last 24 hours: Filed Vitals:   07/07/15 2036 07/08/15 0650  BP: 125/53 124/60  Pulse: 115 83  Temp: 99.1 F (37.3 C) 97.6 F (36.4 C)  Resp: 20 20    General appearance: alert, cooperative and MILD TO MODERATE distress WHEN MOVING  Resp: clear to auscultation bilaterally Cardio: regular rate and rhythm GI: soft, non-tender; bowel sounds normal; no masses,  no organomegaly GROIN: SWOLLEN GENITALS, FOLEY IN PLACE, NO SKIN BREAKDOWN  Lab Results:  K 4.3 Cr 2.04 Mg 1.7   Studies/Results: No results found.  Medications:  I have reviewed the patient's current medications.  LOS: 5 days   Barney Drain 01/19/2014, 2:23 PM

## 2015-07-09 ENCOUNTER — Ambulatory Visit: Payer: Medicare Other

## 2015-07-09 DIAGNOSIS — K7460 Unspecified cirrhosis of liver: Secondary | ICD-10-CM | POA: Insufficient documentation

## 2015-07-09 DIAGNOSIS — K769 Liver disease, unspecified: Secondary | ICD-10-CM

## 2015-07-09 LAB — BASIC METABOLIC PANEL
Anion gap: 11 (ref 5–15)
BUN: 42 mg/dL — AB (ref 6–20)
CHLORIDE: 100 mmol/L — AB (ref 101–111)
CO2: 26 mmol/L (ref 22–32)
CREATININE: 2.14 mg/dL — AB (ref 0.61–1.24)
Calcium: 8.5 mg/dL — ABNORMAL LOW (ref 8.9–10.3)
GFR calc Af Amer: 39 mL/min — ABNORMAL LOW (ref >60)
GFR calc non Af Amer: 34 mL/min — ABNORMAL LOW (ref >60)
Glucose, Bld: 128 mg/dL — ABNORMAL HIGH (ref 65–99)
Potassium: 4.4 mmol/L (ref 3.5–5.1)
Sodium: 137 mmol/L (ref 135–145)

## 2015-07-09 LAB — MAGNESIUM: Magnesium: 1.7 mg/dL (ref 1.7–2.4)

## 2015-07-09 LAB — GLUCOSE, CAPILLARY
Glucose-Capillary: 119 mg/dL — ABNORMAL HIGH (ref 65–99)
Glucose-Capillary: 165 mg/dL — ABNORMAL HIGH (ref 65–99)
Glucose-Capillary: 110 mg/dL — ABNORMAL HIGH (ref 65–99)
Glucose-Capillary: 110 mg/dL — ABNORMAL HIGH (ref 65–99)

## 2015-07-09 NOTE — Progress Notes (Signed)
    Subjective: Scrotal pain/discomfort. No abdominal pain, no N/V. Tolerating diet. Ambulating around in room .  Objective: Vital signs in last 24 hours: Temp:  [97.5 F (36.4 C)-98 F (36.7 C)] 97.5 F (36.4 C) (11/28 0550) Pulse Rate:  [76-87] 86 (11/28 0550) Resp:  [20] 20 (11/28 0550) BP: (100-138)/(53-81) 128/75 mmHg (11/28 0550) SpO2:  [97 %-100 %] 99 % (11/28 0550) Weight:  [445 lb 5.3 oz (202 kg)] 445 lb 5.3 oz (202 kg) (11/28 0550) Last BM Date: 07/08/15 General:   Alert and oriented, pleasant Head:  Normocephalic and atraumatic. Abdomen:  Bowel sounds present, largely obese, objectively less distended and more soft than last week. SEVERE scrotal edema.  Extremities:  With 2-3+ pitting edema, anasarca Psych:  Alert and cooperative. Normal mood and affect.  Intake/Output from previous day: 11/27 0701 - 11/28 0700 In: 840 [P.O.:840] Out: 1900 [Urine:1900] Intake/Output this shift:    Lab Results:  Recent Labs  07/08/15 0542  WBC 4.2  HGB 9.3*  HCT 27.7*  PLT 50*   BMET  Recent Labs  07/07/15 0608 07/08/15 0542 07/09/15 0609  NA 136 136 137  K 4.2 4.3 4.4  CL 103 103 100*  CO2 26 27 26   GLUCOSE 127* 153* 128*  BUN 40* 40* 42*  CREATININE 2.01* 2.04* 2.14*  CALCIUM 8.2* 8.2* 8.5*     Assessment: 54 year old male with HCV/ETOH cirrhosis, decompensated with anasarca and severe scrotal edema. With end stage liver disease, options limited. Not a candidate for liver transplantation unless weight loss documented; overall poor prognosis. Cr just slightly increased to 2.14. Will need to closely monitor, rechecking 11/29. Continue Aldactone 50 mg daily and closely monitor. Lasix/Albumin per attending. Normocytic anemia without overt GI bleeding, multifactorial.    Plan: Aldactone 50 mg daily, recheck BMP tomorrow  Lactulose and Xifaxan, titrating to 3 soft BMS daily Albumin/Lasix per attending  Keep scrotal area elevated as much as possible  Repeat CBC  tomorrow morning   Orvil Feil, ANP-BC [facility]      LOS: 7 days    07/09/2015, 8:04 AM

## 2015-07-09 NOTE — Evaluation (Signed)
Physical Therapy Evaluation Patient Details Name: [name] MRN: [id number] DOB: 1960-10-24 Today's Date: 07/09/2015   History of Present Illness  Patient is a 54 year old man with multiple medical comorbidities well known for a recent long hospitalization for what was presumed to be a right lower extremity cellulitis for which he received 15 days of IV vancomycin. 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 He states that his anasarca and specifically his scrotal edema has gotten much worse in the past 6 days since discharge. It has gotten to the place where the underside of his scrotum is excoriated he was also having difficulty urinating due to the degree of edema. Because of this he comes to the hospital for evaluation. Renal function remains elevated from baseline, however slightly decreased from discharge at 2.2 to, a very decreased albumin at 2.2. We have been asked to admit him for further evaluation and management.  Pt lives with wife and is normally independent at home with a cane.  Clinical Impression  Pt was seen for evaluation.  He has severe scrotal edema causing him pain with any and all mobility.  He was cooperative but very limited in his ability to participate with PT today due to this pain.  His strength is WNL but transfers are very labored due to pain.  He was able to ambulate 61' with a cane but he became very agitated about the pain he had and insisted on terminating our visit.  I believe that a walker may be helpful to him to help ease the labor of gait.  We will plan to see him tomorrow and have him ambulate with a bariatric walker.  Were it not for the scrotal pain he has there would not be any mobility problems.    Follow Up Recommendations  (to be determined)    Equipment Recommendations  None recommended by PT    Recommendations for Other Services   none    Precautions / Restrictions  Precautions Precautions: Fall Restrictions Weight Bearing Restrictions: No      Mobility  Bed Mobility Overal bed mobility: Needs Assistance Bed Mobility: Supine to Sit     Supine to sit: Min guard     General bed mobility comments: transfer extremely labored due to scrotal pain with any movement in the bed  Transfers Overall transfer level: Needs assistance Equipment used: Straight cane Transfers: Sit to/from Stand Sit to Stand: Supervision            Ambulation/Gait Ambulation/Gait assistance: Supervision Ambulation Distance (Feet): 15 Feet Assistive device: Straight cane Gait Pattern/deviations: Wide base of support;Antalgic   Gait velocity interpretation: <1.8 ft/sec, indicative of risk for recurrent falls General Gait Details: pt reports severe pain due to scrotum rubbing against LEs and this upset him tremendously to where he became very agitated and insisted on sitting down...a walker would be beneficial to give him more support  Stairs            Wheelchair Mobility    Modified Rankin (Stroke Patients Only)       Balance Overall balance assessment: No apparent balance deficits (not formally assessed)                                           Pertinent Vitals/Pain Pain Assessment: 0-10 Pain Score: 10-Worst pain ever Pain Location:  scrotum secondary to edema Pain Descriptors / Indicators: Sore Pain Intervention(s): Limited activity within patient's tolerance;Repositioned    Home Living Family/patient expects to be discharged to:: Private residence Living Arrangements: Spouse/significant other Available Help at Discharge: Family;Available 24 hours/day Type of Home: House Home Access: Stairs to enter Entrance Stairs-Rails: Right Entrance Stairs-Number of Steps: 3 Home Layout: One level Home Equipment: Walker - 2 wheels;Cane - single point      Prior Function Level of Independence: Independent with assistive device(s)          Comments: uses a cane at home     Hand Dominance        Extremity/Trunk Assessment   Upper Extremity Assessment: Overall WFL for tasks assessed           Lower Extremity Assessment: Overall WFL for tasks assessed      Cervical / Trunk Assessment: Normal  Communication   Communication: No difficulties  Cognition Arousal/Alertness: Awake/alert Behavior During Therapy: WFL for tasks assessed/performed Overall Cognitive Status: Within Functional Limits for tasks assessed                      General Comments      Exercises        Assessment/Plan    PT Assessment Patient needs continued PT services  PT Diagnosis Difficulty walking;Abnormality of gait   PT Problem List Decreased activity tolerance;Decreased mobility;Pain;Obesity  PT Treatment Interventions Gait training   PT Goals (Current goals can be found in the Care Plan section) Acute Rehab PT Goals Patient Stated Goal: none stated PT Goal Formulation: With patient/family Time For Goal Achievement: 07/23/15 Potential to Achieve Goals: Good    Frequency Min 3X/week   Barriers to discharge   none    Co-evaluation               End of Session Equipment Utilized During Treatment: Gait belt Activity Tolerance: Patient limited by pain Patient left: in chair;with call bell/phone within reach Nurse Communication: Mobility status         Time: [id number] PT Time Calculation (min) (ACUTE ONLY): 24 min   Charges:   PT Evaluation $Initial PT Evaluation Tier I: 1 Procedure     PT G CodesDemetrios Isaacs L  PT 07/09/2015, 2:23 PM [phone]

## 2015-07-09 NOTE — Progress Notes (Signed)
PROGRESS NOTE  [name] [id number] DOB: November 30, 1960 DOA: 07/02/2015 PCP: [name], MD  Summary: [id number] PMH alcoholic cirrhosis, hepatitis C, recent 15 day hospitalization for RLE cellulitis, presented with anasarca. Admitted for anasarca secondary to cirrhosis for IV diuresis.   Assessment/Plan: 1. Anasarca, secondary to cirrhosis complicated by acute diastolic CHF. Continues to improve slowly, down >12.5 L since admission.  2. 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 GI following. Continue spironolactone.  3. Acute diastolic CHF. Continues to improve with diuresis.  4. AKI, remains stable and improved over discharge discharge. Suspect CKD, may be at new baseline.   5. RLE wound, skin changes stable.   6. DM type 2, stable.  7. COPD, stable.  8. Anemia of chronic disease, continues remain stable. 9. Recent hospitalization for cellulitis RLE.   Overall improving, plan to continue lasix and Spironolactone. Still has massive volume overload.  Will consult PT   Code Status: Full code DVT prophylaxis: SCDs Family Communication: Wife bedside updated Disposition Plan: Discharge home once improved.   Murray Hodgkins, MD  [facility]  Pager [phone] If 7PM-7AM, please contact night-coverage at [email or web address], password [facility] 07/09/2015, 7:27 AM  LOS: 7 days   Consultants:  GI  urology  Procedures:  Echo Study Conclusions  - Left ventricle: The cavity size was normal. Wall thickness was increased in a pattern of severe LVH. Systolic function was normal. The estimated ejection fraction was in the range of 60% to 65%. Wall motion was normal; there were no regional wall motion abnormalities. Doppler parameters are consistent with abnormal left ventricular relaxation (grade 1 diastolic dysfunction). - Aortic valve: Mildly calcified annulus.  Trileaflet; mildly thickened leaflets. Valve area (VTI): 2.54 cm^2. Valve area (Vmax): 2.48 cm^2. - Technically difficult study. Definity echocontrast was used to enhance visualization.  Antibiotics:    HPI/Subjective: Overall better; less edema everywhere but scrotum. Eating.  Objective: Filed Vitals:   07/08/15 0650 07/08/15 1424 07/08/15 2205 07/09/15 0550  BP: 124/60 138/53 100/81 128/75  Pulse: 83 76 87 86  Temp: 97.6 F (36.4 C) 97.9 F (36.6 C) 98 F (36.7 C) 97.5 F (36.4 C)  TempSrc: Oral Oral Oral Oral  Resp: 20 20 20 20   Height:      Weight: 135 kg (297 lb 9.9 oz)   202 kg (445 lb 5.3 oz)  SpO2: 100% 97% 100% 99%    Intake/Output Summary (Last 24 hours) at 07/09/15 0727 Last data filed at 07/09/15 0451  Gross per 24 hour  Intake    840 ml  Output   1900 ml  Net  -1060 ml     Filed Weights   07/07/15 0426 07/08/15 0650 07/09/15 0550  Weight: 132 kg (291 lb 0.1 oz) 135 kg (297 lb 9.9 oz) 202 kg (445 lb 5.3 oz)    Exam: Afebrile, VSS, no hypoxia General:  Appears comfortable, calm. Cardiovascular: Regular rate and rhythm, no murmur, rub or gallop. Respiratory: Clear to auscultation bilaterally, no wheezes, rales or rhonchi. Normal respiratory effort. Abdomen: soft, nontender. Abdominal edema improving.  Skin: chronic RLE rash stable; scrotum with erythema without change Musculoskeletal:  BLE edema somewhat improved still 3+. Scrotal edema unchanged.  Psychiatric: grossly normal mood and affect, speech fluent and appropriate Neurologic: grossly non-focal.  New data reviewed:  UOP 1900  -12.5L since admission  CBG stable  BMP mild increase BUN 42, Creatinine 2.14   Scheduled Meds: . albumin human  50 g Intravenous Daily  .  furosemide  40 mg Intravenous BID  . insulin aspart  0-15 Units Subcutaneous TID WC  . insulin aspart  0-5 Units Subcutaneous QHS  . insulin aspart  4 Units Subcutaneous TID WC  . insulin glargine  10 Units  Subcutaneous QHS  . lactulose  10 g Oral Daily  . pantoprazole  40 mg Oral Daily  . rifaximin  550 mg Oral BID  . sodium chloride  3 mL Intravenous Q12H  . spironolactone  50 mg Oral Daily   Continuous Infusions:   Principal Problem:   Anasarca Active Problems:   Hepatic cirrhosis (HCC)   Diabetes ([place])   Chronic hepatitis C with cirrhosis (HCC)   Scrotal edema   Thrombocytopenia (HCC)   Morbid obesity (HCC)   Leg pain, lateral   Cellulitis of right lower extremity   Time spent 15 minutes   By signing my name below, I, Rennis Harding attest that this documentation has been prepared under the direction and in the presence of Murray Hodgkins, MD Electronically signed: Rennis Harding  07/09/2015 11:54am   I personally performed the services described in this documentation. All medical record entries made by the scribe were at my direction. I have reviewed the chart and agree that the record reflects my personal performance and is accurate and complete. Murray Hodgkins, MD

## 2015-07-10 DIAGNOSIS — K769 Liver disease, unspecified: Secondary | ICD-10-CM

## 2015-07-10 LAB — CBC
HEMATOCRIT: 24.6 % — AB (ref 39.0–52.0)
HEMOGLOBIN: 8.4 g/dL — AB (ref 13.0–17.0)
MCH: 32.6 pg (ref 26.0–34.0)
MCHC: 34.1 g/dL (ref 30.0–36.0)
MCV: 95.3 fL (ref 78.0–100.0)
Platelets: 48 10*3/uL — ABNORMAL LOW (ref 150–400)
RBC: 2.58 MIL/uL — AB (ref 4.22–5.81)
RDW: 14.5 % (ref 11.5–15.5)
WBC: 2.5 10*3/uL — ABNORMAL LOW (ref 4.0–10.5)

## 2015-07-10 LAB — BASIC METABOLIC PANEL
Anion gap: 8 (ref 5–15)
BUN: 41 mg/dL — ABNORMAL HIGH (ref 6–20)
CHLORIDE: 101 mmol/L (ref 101–111)
CO2: 28 mmol/L (ref 22–32)
CREATININE: 2.05 mg/dL — AB (ref 0.61–1.24)
Calcium: 8.5 mg/dL — ABNORMAL LOW (ref 8.9–10.3)
GFR calc non Af Amer: 35 mL/min — ABNORMAL LOW (ref >60)
GFR, EST AFRICAN AMERICAN: 41 mL/min — AB (ref >60)
Glucose, Bld: 131 mg/dL — ABNORMAL HIGH (ref 65–99)
POTASSIUM: 4 mmol/L (ref 3.5–5.1)
SODIUM: 137 mmol/L (ref 135–145)

## 2015-07-10 LAB — MAGNESIUM: MAGNESIUM: 1.6 mg/dL — AB (ref 1.7–2.4)

## 2015-07-10 LAB — GLUCOSE, CAPILLARY
GLUCOSE-CAPILLARY: 129 mg/dL — AB (ref 65–99)
GLUCOSE-CAPILLARY: 147 mg/dL — AB (ref 65–99)
Glucose-Capillary: 100 mg/dL — ABNORMAL HIGH (ref 65–99)
Glucose-Capillary: 101 mg/dL — ABNORMAL HIGH (ref 65–99)

## 2015-07-10 MED ORDER — ZOLPIDEM TARTRATE 5 MG PO TABS
5.0000 mg | ORAL_TABLET | Freq: Once | ORAL | Status: AC
  Administered 2015-07-10: 5 mg via ORAL
  Filled 2015-07-10: qty 1

## 2015-07-10 MED ORDER — OXYCODONE HCL 5 MG PO TABS
10.0000 mg | ORAL_TABLET | Freq: Once | ORAL | Status: AC
  Administered 2015-07-10: 10 mg via ORAL
  Filled 2015-07-10: qty 2

## 2015-07-10 NOTE — Care Management Note (Signed)
Case Management Note  Patient Details  Name: TAKAYUKI KIMBLER MRN: [id number] Date of Birth: Jan 02, 1961  Subjective/Objective:                    Action/Plan:   Expected Discharge Date:  07/06/15               Expected Discharge Plan:  [place]  In-House Referral:  NA  Discharge planning Services  CM Consult  Post Acute Care Choice:  Resumption of Svcs/PTA Provider Choice offered to:  Patient  DME Arranged:    DME Agency:  [place]:  RN [facility] Agency:     Status of Service:  Completed, signed off  Medicare Important Message Given:  Yes Date Medicare IM Given:    Medicare IM give by:    Date Additional Medicare IM Given:    Additional Medicare Important Message give by:     If discussed at [place] of Stay Meetings, dates discussed: 07/10/15   Additional Comments:  Joylene Draft, RN 07/10/2015, 4:03 PM

## 2015-07-10 NOTE — Progress Notes (Signed)
Pt noted to be increasingly lethargic throughout the shift.  VSS.  Pt did receive both oral narcotic and oral Ativan at approximately 0700 before this writer assumed care.  Pt has been able to arouse to both verbal commands and to pain.  Pt was also noted to be able to ambulate in hallway and room with standby assist from PT earlier this shift.  Dr. Sarajane Jews was paged to make aware.  Will continue to monitor.

## 2015-07-10 NOTE — Progress Notes (Signed)
    Subjective: Scrotal edema improved. 2 BMs yesterday. No confusion.   Objective: Vital signs in last 24 hours: Temp:  [97.9 F (36.6 C)-98.6 F (37 C)] 98.6 F (37 C) (11/29 0659) Pulse Rate:  [88-97] 97 (11/29 0659) Resp:  [20] 20 (11/29 0659) BP: (130-161)/(60-64) 161/64 mmHg (11/29 0659) SpO2:  [97 %-98 %] 97 % (11/29 0659) Weight:  [443 lb 2 oz (201 kg)] 443 lb 2 oz (201 kg) (11/29 0659) Last BM Date: 07/09/15 General:   Alert and oriented, pleasant Abdomen:  Bowel sounds present, soft, largely obese, improved anasarca Extremities:  2-3+ pitting edema bilateral lower extremities Neurologic:  Alert and  oriented x4 Psych:  Alert and cooperative. Normal mood and affect.  Intake/Output from previous day: 11/28 0701 - 11/29 0700 In: 480 [P.O.:480] Out: 2025 [Urine:2025] Intake/Output this shift:    Lab Results:  Recent Labs  07/08/15 0542  WBC 4.2  HGB 9.3*  HCT 27.7*  PLT 50*   BMET  Recent Labs  07/08/15 0542 07/09/15 0609  NA 136 137  K 4.3 4.4  CL 103 100*  CO2 27 26  GLUCOSE 153* 128*  BUN 40* 42*  CREATININE 2.04* 2.14*  CALCIUM 8.2* 8.5*    Assessment: 54 year old male with HCV/ETOH cirrhosis, decompensated with anasarca and severe scrotal edema. Slight improvement in scrotal edema today. With end stage liver disease, options limited. Not a candidate for liver transplantation unless weight loss documented; overall poor prognosis. Cr just remaining overall stable. Continue Aldactone 50 mg daily and closely monitor. Lasix/Albumin per attending. Normocytic anemia without overt GI bleeding, multifactorial.   Plan: Aldactone 50 mg daily Albumin/lasix per attending  Lactulose and Xifaxan Protonix once daily Will follow peripherally  Orvil Feil, ANP-BC [facility]    LOS: 8 days    07/10/2015, 8:07 AM

## 2015-07-10 NOTE — Progress Notes (Signed)
PROGRESS NOTE  [name] [id number] DOB: 02-14-1961 DOA: 07/02/2015 PCP: [name], MD  Summary: [id number] PMH alcoholic cirrhosis, hepatitis C, recent 15 day hospitalization for RLE cellulitis, presented with anasarca. Admitted for anasarca secondary to cirrhosis for IV diuresis.   Assessment/Plan: 1. Anasarca, secondary to cirrhosis complicated by acute diastolic CHF. Continues to improve slowly, down >14 L since admission.  2. 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 Continue spironolactone. GI following.  3. Acute diastolic CHF. Continues to improve. 4. AKI, remains stable and improved over discharge. Suspect CKD, may be at new baseline.   5. RLE wound, skin changes stable.   6. DM type 2, remains stable.  7. COPD, remains stable. 8. Anemia of chronic disease, continues to remain stable. 9. Recent hospitalization for cellulitis RLE.   Overall much improved. Very awake and alert. Hold Ativan.   Renal function stable. Will continue Lasix and spironolactone.  Still has massive volume overload precluding discharge  PMT consult for help with symptoms, goals of care given advanced cirrhosis.  Code Status: Full code DVT prophylaxis: SCDs Family Communication:  Disposition Plan: Discharge home once improved.   Murray Hodgkins, MD  [facility]  Pager [phone] If 7PM-7AM, please contact night-coverage at [email or web address], password [facility] 07/10/2015, 7:34 AM  LOS: 8 days   Consultants:  GI  Urology  PT- No additional PT needed   Procedures:  Echo Study Conclusions  - Left ventricle: The cavity size was normal. Wall thickness was increased in a pattern of severe LVH. Systolic function was normal. The estimated ejection fraction was in the range of 60% to 65%. Wall motion was normal; there were no regional wall motion abnormalities. Doppler  parameters are consistent with abnormal left ventricular relaxation (grade 1 diastolic dysfunction). - Aortic valve: Mildly calcified annulus. Trileaflet; mildly thickened leaflets. Valve area (VTI): 2.54 cm^2. Valve area (Vmax): 2.48 cm^2. - Technically difficult study. Definity echocontrast was used to enhance visualization.  Antibiotics:    HPI/Subjective: Feeling ok. Able to eat. Sleepy.  RN reported patient more somnolent today.  Objective: Filed Vitals:   07/08/15 2205 07/09/15 0550 07/09/15 2023 07/10/15 0659  BP: 100/81 128/75 130/60 161/64  Pulse: 87 86 88 97  Temp: 98 F (36.7 C) 97.5 F (36.4 C) 97.9 F (36.6 C) 98.6 F (37 C)  TempSrc: Oral Oral Oral Oral  Resp: 20 20 20 20   Height:      Weight:  202 kg (445 lb 5.3 oz)  201 kg (443 lb 2 oz)  SpO2: 100% 99% 98% 97%    Intake/Output Summary (Last 24 hours) at 07/10/15 0734 Last data filed at 07/10/15 0600  Gross per 24 hour  Intake    480 ml  Output   2025 ml  Net  -1545 ml     Filed Weights   07/08/15 0650 07/09/15 0550 07/10/15 0659  Weight: 135 kg (297 lb 9.9 oz) 202 kg (445 lb 5.3 oz) 201 kg (443 lb 2 oz)    Exam: Afebrile, VSS, no hypoxia General:  Appears calm and comfortable. Awakens easily, maintains alertness during interview. Cardiovascular: RRR, no m/r/g,  Respiratory: CTA bilaterally, no w/r/r. Normal respiratory effort. Abdomen: soft, ntnd  Pitting edema of abdomen but less so.  Skin: no changes to RLE rash Musculoskeletal: 3+ BLE edema, improved. Significantly less scrotal edema.  Psychiatric: grossly normal mood and affect, speech fluent and appropriate Neurologic: grossly non-focal.  New data reviewed:  UOP 2025  -14L  since admission  CBG stable  BUN/Creatine stable  Plt count stable  Hgb slightly low. WBC down to 2.5   Scheduled Meds: . albumin human  50 g Intravenous Daily  . furosemide  40 mg Intravenous BID  . insulin aspart  0-15 Units Subcutaneous  TID WC  . insulin aspart  0-5 Units Subcutaneous QHS  . insulin aspart  4 Units Subcutaneous TID WC  . insulin glargine  10 Units Subcutaneous QHS  . lactulose  10 g Oral Daily  . pantoprazole  40 mg Oral Daily  . rifaximin  550 mg Oral BID  . sodium chloride  3 mL Intravenous Q12H  . spironolactone  50 mg Oral Daily   Continuous Infusions:   Principal Problem:   Anasarca Active Problems:   Hepatic cirrhosis (HCC)   Diabetes (HCC)   Chronic hepatitis C with cirrhosis (HCC)   Scrotal edema   Thrombocytopenia (HCC)   Morbid obesity (HCC)   Leg pain, lateral   Cellulitis of right lower extremity   Chronic liver disease and cirrhosis ([place])   Time spent 20 minutes   By signing my name below, I, Rennis Harding attest that this documentation has been prepared under the direction and in the presence of Murray Hodgkins, MD Electronically signed: Rennis Harding  07/10/2015 1:46pm   I personally performed the services described in this documentation. All medical record entries made by the scribe were at my direction. I have reviewed the chart and agree that the record reflects my personal performance and is accurate and complete. Murray Hodgkins, MD

## 2015-07-10 NOTE — Progress Notes (Signed)
Physical Therapy Treatment Patient Details Name: [name] MRN: [id number] DOB: 02/23/1961 Today's Date: 07/10/2015    History of Present Illness Patient is a 54 year old man with multiple medical comorbidities well known for a recent long hospitalization for what was presumed to be a right lower extremity cellulitis for which he received 15 days of IV vancomycin. 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 He states that his anasarca and specifically his scrotal edema has gotten much worse in the past 6 days since discharge. It has gotten to the place where the underside of his scrotum is excoriated he was also having difficulty urinating due to the degree of edema. Because of this he comes to the hospital for evaluation. Renal function remains elevated from baseline, however slightly decreased from discharge at 2.2 to, a very decreased albumin at 2.2. We have been asked to admit him for further evaluation and management.    PT Comments    Pt ambulating better with rolling walker vs. Cane.  Pt used a bariatric rolling walker today but states that this will not fit in his home therefore we will test pt on a normal rolling walker next visit.  Pt needed four short rest breaks during ambulation.  Follow Up Recommendations  Home health PT     Equipment Recommendations  Rolling walker with 5" wheels    Recommendations for Other Services  none     Precautions / Restrictions Precautions Precautions: Fall Restrictions Weight Bearing Restrictions: No    Mobility  Bed Mobility Overal bed mobility: Needs Assistance Bed Mobility: Supine to Sit;Sit to Supine     Supine to sit: Min guard Sit to supine: Min assist      Transfers                    Ambulation/Gait Ambulation/Gait assistance: Supervision Ambulation Distance (Feet): 150 Feet Assistive device: Rolling walker (2 wheeled) Gait  Pattern/deviations: Decreased step length - right;Decreased step length - left;Decreased dorsiflexion - right;Decreased dorsiflexion - left;Shuffle;Trunk flexed;Wide base of support   Gait velocity interpretation: <1.8 ft/sec, indicative of risk for recurrent falls            Cognition Arousal/Alertness: Awake/alert Behavior During Therapy: WFL for tasks assessed/performed Overall Cognitive Status: Within Functional Limits for tasks assessed                             Pertinent Vitals/Pain Pain Score: 8  Pain Location: scrotum  Pain Descriptors / Indicators: Sore Pain Intervention(s): Limited activity within patient's tolerance       Prior Function   I lives with wife.         PT Goals (current goals can now be found in the care plan section) Acute Rehab PT Goals Patient Stated Goal: none stated PT Goal Formulation: With patient Time For Goal Achievement: 07/23/15 Potential to Achieve Goals: Good Progress towards PT goals: Progressing toward goals    Frequency  Min 3X/week    PT Plan Current plan remains appropriate    Co-evaluation             End of Session Equipment Utilized During Treatment: Gait belt Activity Tolerance: Patient limited by pain Patient left: in bed;with call bell/phone within reach;with family/visitor present     Time: [id number] PT Time Calculation (min) (ACUTE ONLY): 36 min  Charges:  $Gait Training: 23-37 mins  G CodesRayetta Humphrey, PT CLT [phone] 07/10/2015, 11:18 AM

## 2015-07-11 DIAGNOSIS — Z7189 Other specified counseling: Secondary | ICD-10-CM

## 2015-07-11 DIAGNOSIS — Z515 Encounter for palliative care: Secondary | ICD-10-CM | POA: Insufficient documentation

## 2015-07-11 LAB — GLUCOSE, CAPILLARY
GLUCOSE-CAPILLARY: 122 mg/dL — AB (ref 65–99)
GLUCOSE-CAPILLARY: 98 mg/dL (ref 65–99)
Glucose-Capillary: 142 mg/dL — ABNORMAL HIGH (ref 65–99)
Glucose-Capillary: 130 mg/dL — ABNORMAL HIGH (ref 65–99)

## 2015-07-11 LAB — BASIC METABOLIC PANEL
Anion gap: 12 (ref 5–15)
BUN: 38 mg/dL — AB (ref 6–20)
CHLORIDE: 100 mmol/L — AB (ref 101–111)
CO2: 28 mmol/L (ref 22–32)
CREATININE: 1.84 mg/dL — AB (ref 0.61–1.24)
Calcium: 8.9 mg/dL (ref 8.9–10.3)
GFR calc Af Amer: 47 mL/min — ABNORMAL LOW (ref >60)
GFR calc non Af Amer: 40 mL/min — ABNORMAL LOW (ref >60)
GLUCOSE: 124 mg/dL — AB (ref 65–99)
POTASSIUM: 3.9 mmol/L (ref 3.5–5.1)
SODIUM: 140 mmol/L (ref 135–145)

## 2015-07-11 LAB — MAGNESIUM: Magnesium: 1.6 mg/dL — ABNORMAL LOW (ref 1.7–2.4)

## 2015-07-11 MED ORDER — OXYCODONE HCL 5 MG PO TABS
5.0000 mg | ORAL_TABLET | Freq: Once | ORAL | Status: AC
  Administered 2015-07-11: 5 mg via ORAL
  Filled 2015-07-11: qty 1

## 2015-07-11 MED ORDER — OXYCODONE HCL 5 MG PO TABS
5.0000 mg | ORAL_TABLET | Freq: Four times a day (QID) | ORAL | Status: DC | PRN
Start: 2015-07-11 — End: 2015-07-17
  Administered 2015-07-12 – 2015-07-16 (×14): 5 mg via ORAL
  Filled 2015-07-11 (×16): qty 1

## 2015-07-11 NOTE — Consult Note (Signed)
WOC wound consult note Reason for Consult: Scrotal edema, discomfort.  Wound type:Edema/Anasarca Pressure Ulcer POA: No Measurement: generalized edema to scrotum, erythema and pain, NP notes from this AM report this is improving. Plans to discharge home with [facility].   Wound bed:Intact skin, edematous, tender and eythematous Drainage (amount, consistency, odor) None noted Periwound:Edema Dressing procedure/placement/frequency:Cleanse scrotum with soap and water and pat gently dry.  Apply moisture barrier cream.  Wrap scrotal area with NS moist kerlix gauze that have 3 gtt tea tree oil added.  Can secure with ABD pad and tape.  Bilateral lower legs have edema.  Previous orders were to apply Xeroform gauze to any blistered areas, kerlix and ace wrap for light compression.  Continue with [place] after discharge.  Will not follow at this time.  Please re-consult if needed.  Domenic Moras RN BSN [place] Pager [phone]

## 2015-07-11 NOTE — Consult Note (Signed)
Consultation Note Date: 07/11/2015   Patient Name: [name]  DOB: Jan 25, 1961  MRN: [id number]  Age / Sex: 54 y.o., male   PCP: [name], MD Referring Physician: Bonnielee Haff, MD  Reason for Consultation: Establishing goals of care and Psychosocial/spiritual support  Palliative Care Assessment and Plan Summary of Established Goals of Care and Medical Treatment Preferences   Clinical Assessment/Narrative: [name] is resting in a dark room.  He greets me as I enter, making and keeping eye contact.  We talk about his chronic health problems, DM, cirrhosis of liver, edema and use of catheter.  He tells me that he has not had a drink in 22 years (since he married) and that he had a trial of drug to cure his Hep C, but failed, "there is nothing else they can do for me."  We talk about HCPOA and AD. I ask if he has ever considered these before, and he states that he has not.   He shares that he saw a oncologist who talked with them some, but his wife started crying, and was unable to hear it.  He goes on to tell me that, around 4 months ago, he was told he would have 3-6 months-maybe a year.  I share with him that this would not surprise me to be true.  I share that he has a right to say what this time looks like for him.  We agree to meet in the am to talk with his wife.  He is unable to reach her by phone to schedule a time.    Contacts/Participants in Discussion: Primary Decision Maker: [name] is able to make his own decisions.   HCPOA: no  He tells me that he has been trying to get his wife or brother [name] to "take it".    Code Status/Advance Care Planning:  FULL code.   He tells me that he wants to have trial of life support, "bring me back".  "If I'm just going to lay there for the rest of my life, let me go".   Symptom Management:   Zofran 4 mg PO/IV Q 6 hours PRN  Palliative Prophylaxis: Senna-S 1 Q HS PRN   Psycho-social/Spiritual:   Support System: lives  with wife. Has brother to help   Desire for further Chaplaincy support:Not at this time.   Prognosis: Unable to determine, likely less than 6 months.   Discharge Planning:  TBD.  Rehab vs home with HH/or Hospice.        Chief Complaint:   Scrotal edema and pain History of Present Illness:  Patient is a 54 year old man with multiple medical comorbidities well known for a recent long hospitalization for what was presumed to be a right lower extremity cellulitis for which he received 15 days of IV vancomycin. 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 He states that his anasarca and specifically his scrotal edema has gotten much worse in the past 6 days since discharge. It has gotten to the place where the underside of his scrotum is excoriated he was also having difficulty urinating due to the degree of edema. Because of this he comes to the hospital for evaluation. Renal function remains elevated from baseline, however slightly decreased from discharge at 2.2 to, a very decreased albumin at 2.2. We have been asked to admit him for further evaluation and management.  Primary Diagnoses  Present on Admission:  .  Scrotal edema . Hepatic cirrhosis ([place]) . Thrombocytopenia ([place]) . Chronic hepatitis C with cirrhosis ([place])  Palliative Review of Systems: Denies pain, anxiety, dyspnea, or NV that is not controlled.  I have reviewed the medical record, interviewed the patient and family, and examined the patient. The following aspects are pertinent.  Past Medical History  Diagnosis Date  . Hypertension   . Diabetes mellitus   . GERD (gastroesophageal reflux disease)     DEC 2010 EGD/Bx REACTIVE GASTROPATHY, NO VARICES  . Hemorrhoids, internal   . BMI 40.0-44.9, adult ([place]) OCT 2010 269 LBS    APR 2012 279 LBS AUG 2014 185 LBS  . Cirrhosis ([place]) NOV 2010 CHILD PUGH A    ETOH/HCV/OBESITY  . IV drug abuse REMOTE  .  Hepatitis 2010 HEP C    AST 509 ALT 267 ALK PHOS 165 ALB 3.8 NEG IGM HAV/HBSAg  . Gallstone AUG 2012 1 CM  . GERD 10/25/2009  . COPD (chronic obstructive pulmonary disease) ([place])   . Hepatitis C   . Pancytopenia ([place]) 2013  . Splenomegaly 2013  . Other pancytopenia ([place]) 02/18/2013  . Iron (Fe) deficiency anemia 02/18/2013  . Splenomegaly 02/18/2013  . Neuropathy ([place])   . Biliary dyskinesia JUL 2015    HIDA GB EF 5%  . Chronic pain   . Chronic leg pain     left  . Portal venous hypertension (HCC)   . Asthma   . Medically noncompliant 05/30/2015   Social History   Social History  . Marital Status: Married    Spouse Name: N/A  . Number of Children: N/A  . Years of Education: N/A   Social History Main Topics  . Smoking status: Current Some Day Smoker -- 0.50 packs/day    Types: Cigarettes  . Smokeless tobacco: Never Used  . Alcohol Use: No     Comment: 30 years ago  . Drug Use: No     Comment: 30 yrs ago.  Marland Kitchen Sexual Activity:    Partners: Female    Museum/gallery curator: Condom     Comment: spouse   Other Topics Concern  . None   Social History Narrative   Family History  Problem Relation Age of Onset  . Colon cancer Neg Hx   . Anesthesia problems Neg Hx   . Hypotension Neg Hx   . Malignant hyperthermia Neg Hx   . Pseudochol deficiency Neg Hx   . Cancer Father    Scheduled Meds: . albumin human  50 g Intravenous Daily  . furosemide  40 mg Intravenous BID  . insulin aspart  0-15 Units Subcutaneous TID WC  . insulin aspart  0-5 Units Subcutaneous QHS  . insulin aspart  4 Units Subcutaneous TID WC  . insulin glargine  10 Units Subcutaneous QHS  . lactulose  10 g Oral Daily  . pantoprazole  40 mg Oral Daily  . rifaximin  550 mg Oral BID  . sodium chloride  3 mL Intravenous Q12H  . spironolactone  50 mg Oral Daily   Continuous Infusions:  PRN Meds:.sodium chloride, albuterol, cyclobenzaprine, hydrocerin, ipratropium-albuterol, ondansetron **OR**  ondansetron (ZOFRAN) IV, senna-docusate, sodium chloride Medications Prior to Admission:  Prior to Admission medications   Medication Sig Start Date End Date Taking? Authorizing Provider  acetaminophen (TYLENOL) 500 MG tablet Take 500 mg by mouth every 6 (six) hours as needed for moderate pain.   Yes Historical Provider, MD  albuterol (PROVENTIL HFA;VENTOLIN HFA) 108 (90 BASE) MCG/ACT inhaler Inhale 2 puffs  into the lungs every 6 (six) hours as needed. Shortness of breath 08/14/14  Yes [name], MD  cyclobenzaprine (FLEXERIL) 5 MG tablet Take 25 mg by mouth daily as needed for muscle spasms.  01/25/15  Yes Historical Provider, MD  fluconazole (DIFLUCAN) 100 MG tablet Take 1 tablet (100 mg total) by mouth daily. 06/26/15  Yes Erline Hau, MD  furosemide (LASIX) 20 MG tablet Take 1 tablet (20 mg total) by mouth at bedtime. 05/15/15  Yes [name], MD  glipiZIDE (GLUCOTROL) 10 MG tablet TAKE 1 TABLET (10 MG TOTAL) BY MOUTH DAILY. 06/04/15  Yes [name], MD  hydrocerin (EUCERIN) CREA Apply 1 application topically 3 (three) times daily as needed (for itching/dry skin). 06/26/15  Yes Erline Hau, MD  hydrOXYzine (ATARAX/VISTARIL) 10 MG tablet TAKE 1 TABLET (10 MG TOTAL) BY MOUTH EVERY 4 (FOUR) HOURS AS NEEDED FOR ITCHING. 05/03/15  Yes [name], MD  Insulin Glargine (LANTUS SOLOSTAR) 100 UNIT/ML Solostar Pen Inject 10 Units into the skin at bedtime.   Yes Historical Provider, MD  lactulose (CHRONULAC) 10 GM/15ML solution TAKE 15 MLS (10 G TOTAL) BY MOUTH 3 (THREE) TIMES DAILY. 06/08/15  Yes [name], MD  rifaximin (XIFAXAN) 550 MG TABS tablet Take 1 tablet (550 mg total) by mouth 2 (two) times daily. 12/19/14  Yes [name], MD  spironolactone (ALDACTONE) 25 MG tablet TAKE ON MONDAY-WEDNESDAY-FRIDAY Patient taking differently: TAKE ONE TABLET BY MOUTH ONCE DAILY 01/22/15  Yes [name], MD  omeprazole (PRILOSEC) 20 MG capsule Take 2  capsules (40 mg total) by mouth daily. 07/04/15   [name], MD  Oxycodone HCl 10 MG TABS Take 1 tab every 4 hours for pain 07/04/15   [name], MD   Allergies  Allergen Reactions  . Penicillins Hives    Has patient had a PCN reaction causing immediate rash, facial/tongue/throat swelling, SOB or lightheadedness with hypotension: Yes Has patient had a PCN reaction causing severe rash involving mucus membranes or skin necrosis: Yes Has patient had a PCN reaction that required hospitalization No Has patient had a PCN reaction occurring within the last 10 years: No If all of the above answers are "NO", then may proceed with Cephalosporin use.    CBC:    Component Value Date/Time   WBC 2.5* 07/10/2015 0808   WBC 3.7 04/10/2015 1609   HGB 8.4* 07/10/2015 0808   HCT 24.6* 07/10/2015 0808   HCT 35.7* 04/10/2015 1609   PLT 48* 07/10/2015 0808   MCV 95.3 07/10/2015 0808   NEUTROABS 1.9 07/02/2015 1005   NEUTROABS 2.1 04/10/2015 1609   LYMPHSABS 0.9 07/02/2015 1005   LYMPHSABS 1.0 04/10/2015 1609   MONOABS 0.8 07/02/2015 1005   EOSABS 0.1 07/02/2015 1005   BASOSABS 0.0 07/02/2015 1005   BASOSABS 0.0 04/10/2015 1609   Comprehensive Metabolic Panel:    Component Value Date/Time   NA 140 07/11/2015 0639   NA 141 04/10/2015 1609   K 3.9 07/11/2015 0639   CL 100* 07/11/2015 0639   CO2 28 07/11/2015 0639   BUN 38* 07/11/2015 0639   BUN 10 04/10/2015 1609   CREATININE 1.84* 07/11/2015 0639   CREATININE 0.67 07/19/2012 0823   GLUCOSE 124* 07/11/2015 0639   GLUCOSE 215* 04/10/2015 1609   CALCIUM 8.9 07/11/2015 0639   AST 235* 07/03/2015 0634   ALT 72* 07/03/2015 0634   ALKPHOS 129* 07/03/2015 0634   BILITOT 2.1* 07/03/2015 6384  PROT 6.7 07/03/2015 0634   PROT 6.8 08/14/2014 0846   ALBUMIN 2.4* 07/03/2015 0634   ALBUMIN 3.5 08/14/2014 0846    Physical Exam: Vital Signs: BP 179/35 mmHg  Pulse 85  Temp(Src) 98.2 F (36.8 C) (Oral)  Resp 20  Ht _0  (1.651 m)   Wt 146.648 kg (323 lb 4.8 oz)  BMI 53.80 kg/m2  SpO2 100% SpO2: SpO2: 100 % O2 Device: O2 Device: Not Delivered O2 Flow Rate:   Intake/output summary:  Intake/Output Summary (Last 24 hours) at 07/11/15 1705 Last data filed at 07/11/15 1319  Gross per 24 hour  Intake    243 ml  Output   4050 ml  Net  -3807 ml   LBM: Last BM Date: 07/10/15 Baseline Weight: Weight: (!) 150.141 kg (331 lb) Most recent weight: Weight: (!) 146.648 kg (323 lb 4.8 oz)  Exam Findings:  Constitutional: appears older than age, lying in bed, obese.  Resp: even and non labored.  Cardio: no edema at this time Skin: RLL redness, flaky skin.  GI: abd obese, soft.          Palliative Performance Scale: 50% at this time.               Additional Data Reviewed: Recent Labs     07/10/15  0808  07/11/15  0639  WBC  2.5*   --   HGB  8.4*   --   PLT  48*   --   NA  137  140  BUN  41*  38*  CREATININE  2.05*  1.84*     Time In: 1530 Time Out: 1610 Time Total: 40 minutes Greater than 50%  of this time was spent counseling and coordinating care related to the above assessment and plan.  Signed by: Drue Novel, NP  Drue Novel, NP  07/11/2015, 5:05 PM  Please contact Palliative Medicine Team phone at [phone] for questions and concerns.

## 2015-07-11 NOTE — Progress Notes (Signed)
PROGRESS NOTE  [name] [id number] DOB: 12-Jan-1961 DOA: 07/02/2015 PCP: [name], MD  Brief Summary: [id number] PMH alcoholic cirrhosis, hepatitis C, recent 15 day hospitalization for RLE cellulitis, presented with anasarca. Admitted for anasarca secondary to cirrhosis for IV diuresis.   Assessment/Plan:  Anasarca, secondary to cirrhosis complicated by acute diastolic CHF Continues to improve slowly, down >16 L since admission. Continue intravenous Lasix for now. Strict ins and outs and daily weights. Weight measurements are not accurate. Patient also has significant scrotal edema. Scrotal support might help as it is quite tender. He did undergo scrotal ultrasound in October which revealed varicocele and hydrocele. No torsion was noted.  Decompensated alcoholic/hepatitis C cirrhosis  With associated varices, anasarca, thrombocytopenia, splenomegaly, portal venous hypertension. Continue spironolactone. GI following. Palliative medicine input is pending.   Acute diastolic CHF Continues to improve.  Acute kidney injury  Creatinine is improving with diuresis. He could have underlying chronic kidney disease as well. Continue to monitor urine output.     RLE wound Skin changes stable. Wound care nurse is following. Will need home health at discharge.  DM type 2 Continue sliding scale coverage. Continue Lantus.  COPD Remains stable.  Anemia of chronic disease Hemoglobin has trended down some. This is likely secondary to fluid shifts. No evidence for overt bleeding. Continue to monitor for now.  Recent hospitalization for cellulitis RLE. Stable  Code Status: Full code DVT prophylaxis: SCDs Family Communication: Discussed with the patient Disposition Plan: Await more diuresis. Anticipate discharge in 2-3 days.   Bonnielee Haff, MD  [facility]  Pager [phone]  If 7PM-7AM, please contact night-coverage at [email or web address], password [facility] 07/11/2015, 9:48 AM  LOS:  9 days   Consultants:  GI  Urology  PT- No additional PT needed   Procedures:  Echo Study Conclusions - Left ventricle: The cavity size was normal. Wall thickness wasincreased in a pattern of severe LVH. Systolic function wasnormal. The estimated ejection fraction was in the range of 60%to 65%. Wall motion was normal; there were no regional wallmotion abnormalities. Doppler parameters are consistent withabnormal left ventricular relaxation (grade 1 diastolicdysfunction). - Aortic valve: Mildly calcified annulus. Trileaflet; mildlythickened leaflets. Valve area (VTI): 2.54 cm^2. Valve area(Vmax): 2.48 cm^2. - Technically difficult study. Definity echocontrast was used Licensed conveyancer.   Subjective: Patient continues to have discomfort in the scrotal area as well as in his flanks. Denies any shortness of breath.   Objective: Filed Vitals:   07/10/15 1300 07/10/15 1309 07/10/15 2212 07/11/15 0634  BP: 154/62 147/59 149/61 161/60  Pulse: 87 84 85 94  Temp: 98.1 F (36.7 C)  98.6 F (37 C) 97.5 F (36.4 C)  TempSrc: Oral  Axillary Oral  Resp: 20  20 20   Height:      Weight:    146.648 kg (323 lb 4.8 oz)  SpO2: 97% 97% 97% 100%    Intake/Output Summary (Last 24 hours) at 07/11/15 0948 Last data filed at 07/11/15 0315  Gross per 24 hour  Intake    420 ml  Output   3075 ml  Net  -2655 ml     Filed Weights   07/09/15 0550 07/10/15 0659 07/11/15 0634  Weight: 202 kg (445 lb 5.3 oz) 201 kg (443 lb 2 oz) 146.648 kg (323 lb 4.8 oz)    Exam:  General:  In mild discomfort. Awake and alert.  Cardiovascular: S1, S2 is normal, regular. No rubs, murmurs, or bruit.  Respiratory: CTA bilaterally. Normal respiratory effort. Abdomen: soft, eyelid distended.  Non-tender.  Skin: RLE rash Lower extremities: 2-3+ pitting edema bilateral lower extremities. Improving.  Significant scrotal edema also noted.  Neurologic: No focal deficits    Scheduled Meds: .  albumin human  50 g Intravenous Daily  . furosemide  40 mg Intravenous BID  . insulin aspart  0-15 Units Subcutaneous TID WC  . insulin aspart  0-5 Units Subcutaneous QHS  . insulin aspart  4 Units Subcutaneous TID WC  . insulin glargine  10 Units Subcutaneous QHS  . lactulose  10 g Oral Daily  . oxyCODONE  5 mg Oral Once  . pantoprazole  40 mg Oral Daily  . rifaximin  550 mg Oral BID  . sodium chloride  3 mL Intravenous Q12H  . spironolactone  50 mg Oral Daily   Continuous Infusions:   Principal Problem:   Anasarca Active Problems:   Hepatic cirrhosis (HCC)   Diabetes (HCC)   Chronic hepatitis C with cirrhosis (HCC)   Scrotal edema   Thrombocytopenia (HCC)   Morbid obesity (HCC)   Leg pain, lateral   Cellulitis of right lower extremity   Chronic liver disease and cirrhosis ([place])   Time spent 20 minutes

## 2015-07-11 NOTE — Progress Notes (Signed)
Pt was weighed per bed and stand up scale.  Resulting in 30% lower weight than previous bed weights.

## 2015-07-12 LAB — CBC
HEMATOCRIT: 26.6 % — AB (ref 39.0–52.0)
HEMOGLOBIN: 9.1 g/dL — AB (ref 13.0–17.0)
MCH: 32.6 pg (ref 26.0–34.0)
MCHC: 34.2 g/dL (ref 30.0–36.0)
MCV: 95.3 fL (ref 78.0–100.0)
Platelets: 57 10*3/uL — ABNORMAL LOW (ref 150–400)
RBC: 2.79 MIL/uL — ABNORMAL LOW (ref 4.22–5.81)
RDW: 14.4 % (ref 11.5–15.5)
WBC: 2.8 10*3/uL — AB (ref 4.0–10.5)

## 2015-07-12 LAB — GLUCOSE, CAPILLARY
GLUCOSE-CAPILLARY: 120 mg/dL — AB (ref 65–99)
Glucose-Capillary: 143 mg/dL — ABNORMAL HIGH (ref 65–99)
Glucose-Capillary: 114 mg/dL — ABNORMAL HIGH (ref 65–99)
Glucose-Capillary: 119 mg/dL — ABNORMAL HIGH (ref 65–99)

## 2015-07-12 LAB — BASIC METABOLIC PANEL
ANION GAP: 10 (ref 5–15)
BUN: 33 mg/dL — ABNORMAL HIGH (ref 6–20)
CHLORIDE: 102 mmol/L (ref 101–111)
CO2: 28 mmol/L (ref 22–32)
Calcium: 9 mg/dL (ref 8.9–10.3)
Creatinine, Ser: 1.55 mg/dL — ABNORMAL HIGH (ref 0.61–1.24)
GFR calc non Af Amer: 49 mL/min — ABNORMAL LOW (ref >60)
GFR, EST AFRICAN AMERICAN: 57 mL/min — AB (ref >60)
GLUCOSE: 145 mg/dL — AB (ref 65–99)
Potassium: 3.9 mmol/L (ref 3.5–5.1)
Sodium: 140 mmol/L (ref 135–145)

## 2015-07-12 LAB — MAGNESIUM: MAGNESIUM: 1.5 mg/dL — AB (ref 1.7–2.4)

## 2015-07-12 MED ORDER — MAGNESIUM OXIDE 400 (241.3 MG) MG PO TABS
400.0000 mg | ORAL_TABLET | Freq: Every day | ORAL | Status: DC
  Administered 2015-07-12 – 2015-07-13 (×2): 400 mg via ORAL
  Filled 2015-07-12 (×2): qty 1

## 2015-07-12 NOTE — Plan of Care (Signed)
[name] is again resting in a dark room.  He tells me that he does not feel good today.  He is receiving albumin.  I ask if his wife has come by and if she will come by today.  He tells me that he has not seen her and doesn't know if she is coming.  He agrees to have me call her.  Call to home and mobile numbers, no answer, unable to leave VM.

## 2015-07-12 NOTE — Care Management Note (Signed)
Case Management Note  Patient Details  Name: EUGENE SHADOWENS MRN: [id number] Date of Birth: [birth year]  Subjective/Objective:                    Action/Plan:   Expected Discharge Date:  07/06/15               Expected Discharge Plan:  [place]  In-House Referral:  NA  Discharge planning Services  CM Consult  Post Acute Care Choice:  Resumption of Svcs/PTA Provider Choice offered to:  Patient  DME Arranged:    DME Agency:  [place]:  RN [employer] Agency:     Status of Service:  Completed, signed off  Medicare Important Message Given:  Yes Date Medicare IM Given:    Medicare IM give by:    Date Additional Medicare IM Given:    Additional Medicare Important Message give by:     If discussed at [place] of Stay Meetings, dates discussed:  07/12/15  Additional Comments:  Joylene Draft, RN 07/12/2015, 3:29 PM

## 2015-07-12 NOTE — Progress Notes (Addendum)
[facility] PROGRESS NOTE  [name] [id number] DOB: [birth year] DOA: 07/02/2015  PCP: [name], MD  Brief HPI: [id number] PMH alcoholic cirrhosis, hepatitis C, recent 15 day hospitalization for RLE cellulitis, presented with anasarca. Admitted for anasarca secondary to cirrhosis for IV diuresis.   Past medical history:  Past Medical History  Diagnosis Date  . Hypertension   . Diabetes mellitus   . GERD (gastroesophageal reflux disease)     DEC 2010 EGD/Bx REACTIVE GASTROPATHY, NO VARICES  . Hemorrhoids, internal   . BMI 40.0-44.9, adult ([place]) OCT 2010 269 LBS    APR 2012 279 LBS AUG 2014 185 LBS  . Cirrhosis ([place]) NOV 2010 CHILD PUGH A    ETOH/HCV/OBESITY  . IV drug abuse REMOTE  . Hepatitis 2010 HEP C    AST 509 ALT 267 ALK PHOS 165 ALB 3.8 NEG IGM HAV/HBSAg  . Gallstone AUG 2012 1 CM  . GERD 10/25/2009  . COPD (chronic obstructive pulmonary disease) ([place])   . Hepatitis C   . Pancytopenia ([place]) 2013  . Splenomegaly 2013  . Other pancytopenia ([place]) 02/18/2013  . Iron (Fe) deficiency anemia 02/18/2013  . Splenomegaly 02/18/2013  . Neuropathy ([place])   . Biliary dyskinesia JUL 2015    HIDA GB EF 5%  . Chronic pain   . Chronic leg pain     left  . Portal venous hypertension (HCC)   . Asthma   . Medically noncompliant 05/30/2015    Consultants: Gastroenterology. Previously seen by urology. Palliative medicine  Procedures:  Echocardiogram Study Conclusions - Left ventricle: The cavity size was normal. Wall thickness wasincreased in a pattern of severe LVH. Systolic function wasnormal. The estimated ejection fraction was in the range of 60%to 65%. Wall motion was normal; there were no regional wallmotion abnormalities. Doppler parameters are consistent withabnormal left ventricular relaxation (grade 1 diastolicdysfunction). - Aortic valve: Mildly calcified annulus. Trileaflet; mildlythickened leaflets. Valve area (VTI): 2.54 cm^2. Valve area(Vmax):  2.48 cm^2. - Technically difficult study. Definity echocontrast was used Licensed conveyancer.  Antibiotics: None  Subjective: Patient continues to have discomfort in his scrotal area. No nausea, vomiting. Has been getting around in his room.  Objective: Vital Signs  Filed Vitals:   07/11/15 1656 07/12/15 0433 07/12/15 0523 07/12/15 0830  BP: 179/35 161/59    Pulse: 85 88    Temp: 98.2 F (36.8 C) 98.2 F (36.8 C)    TempSrc: Oral Oral    Resp: 20 20    Height:      Weight:   64.819 kg (142 lb 14.4 oz) 147.2 kg (324 lb 8.3 oz)  SpO2: 100% 96%      Intake/Output Summary (Last 24 hours) at 07/12/15 0902 Last data filed at 07/12/15 0435  Gross per 24 hour  Intake    243 ml  Output   3775 ml  Net  -3532 ml   Filed Weights   07/11/15 0634 07/12/15 0523 07/12/15 0830  Weight: 146.648 kg (323 lb 4.8 oz) 64.819 kg (142 lb 14.4 oz) 147.2 kg (324 lb 8.3 oz)    General appearance: alert, cooperative, appears stated age and no distress Resp: Diminished air entry at the bases but mostly clear to auscultation. No wheezing or rhonchi. No crackles. Cardio: regular rate and rhythm, S1, S2 normal, no murmur, click, rub or gallop GI: soft, non-tender; bowel sounds normal; no masses,  no organomegaly Extremities: 2-3+ pitting edema bilateral lower extremity. Significant scrotal edema is also noted. Chronic skin changes over  her right lower extremity. Neurologic: Alert and oriented 3. No focal neurological deficits are noted.  Lab Results:  Basic Metabolic Panel:  Recent Labs Lab 07/08/15 0542 07/09/15 0609 07/10/15 0808 07/11/15 0639 07/12/15 0555  NA 136 137 137 140 140  K 4.3 4.4 4.0 3.9 3.9  CL 103 100* 101 100* 102  CO2 $Re'27 26 28 28 28  'qHc$ GLUCOSE 153* 128* 131* 124* 145*  BUN 40* 42* 41* 38* 33*  CREATININE 2.04* 2.14* 2.05* 1.84* 1.55*  CALCIUM 8.2* 8.5* 8.5* 8.9 9.0  MG 1.7 1.7 1.6* 1.6* 1.5*   CBC:  Recent Labs Lab 07/08/15 0542 07/10/15 0808 07/12/15 0555   WBC 4.2 2.5* 2.8*  HGB 9.3* 8.4* 9.1*  HCT 27.7* 24.6* 26.6*  MCV 95.5 95.3 95.3  PLT 50* 48* 57*   BNP (last 3 results)  Recent Labs  06/11/15 1546  BNP 89.0    CBG:  Recent Labs Lab 07/11/15 0730 07/11/15 1106 07/11/15 1703 07/11/15 2029 07/12/15 0722  GLUCAP 122* 142* 98 130* 143*    No results found for this or any previous visit (from the past 240 hour(s)).    Studies/Results: No results found.  Medications:  Scheduled: . albumin human  50 g Intravenous Daily  . furosemide  40 mg Intravenous BID  . insulin aspart  0-15 Units Subcutaneous TID WC  . insulin aspart  0-5 Units Subcutaneous QHS  . insulin aspart  4 Units Subcutaneous TID WC  . insulin glargine  10 Units Subcutaneous QHS  . lactulose  10 g Oral Daily  . magnesium oxide  400 mg Oral Daily  . pantoprazole  40 mg Oral Daily  . rifaximin  550 mg Oral BID  . sodium chloride  3 mL Intravenous Q12H  . spironolactone  50 mg Oral Daily   Continuous:  XYI:AXKPVV chloride, albuterol, cyclobenzaprine, hydrocerin, ipratropium-albuterol, ondansetron **OR** ondansetron (ZOFRAN) IV, oxyCODONE, senna-docusate, sodium chloride  Assessment/Plan:  Principal Problem:   Anasarca Active Problems:   Hepatic cirrhosis (HCC)   Diabetes (HCC)   Chronic hepatitis C with cirrhosis (HCC)   Scrotal edema   Thrombocytopenia (HCC)   Morbid obesity (HCC)   Leg pain, lateral   Cellulitis of right lower extremity   Chronic liver disease and cirrhosis ([place])   Palliative care encounter   DNR (do not resuscitate) discussion    Anasarca, secondary to cirrhosis complicated by acute diastolic CHF Continues to improve slowly, down >19 L since admission. Continue intravenous Lasix for now. Strict ins and outs and daily weights. Weight measurements are not accurate. Patient also has significant scrotal edema. Scrotal support might help as it is quite tender. He did undergo scrotal ultrasound in October which revealed  varicocele and hydrocele. No torsion was noted.  Decompensated alcoholic/hepatitis C cirrhosis/thrombocytopenia With associated varices, anasarca, thrombocytopenia, splenomegaly, portal venous hypertension. Continue spironolactone. Continue daily albumin infusion. GI following. Palliative medicine input is appreciated.   Acute diastolic CHF Continues to improve.  Acute kidney injury  Creatinine is improving with diuresis. He could have underlying chronic kidney disease as well. Continue to monitor urine output. Any serum level noted to be low. Start magnesium oxide.   RLE wound Skin changes stable. Wound care nurse is following. Will need home health at discharge.  DM type 2 Continue sliding scale coverage. Continue Lantus. CBGs are well controlled.  COPD Remains stable.  Anemia of chronic disease Hemoglobin has trended down some but stable. This is likely secondary to fluid shifts. No evidence for overt bleeding.  Continue to monitor for now.  Recent hospitalization for cellulitis RLE. Stable   DVT Prophylaxis: SCDs    Code Status: Full code  Family Communication: Discussed with the patient. I have explained to him that most of his symptoms, will take a long time to improve and some may not improve at all considering his advanced liver disease.  Disposition Plan: Continue intravenous diuresis for now. Anticipate transition to oral diuretics over the weekend and then discharge early next week. Home health PT recommended by physical therapy.    LOS: 10 days   [place] [facility] [facility] Pager [phone] 07/12/2015, 9:02 AM  If 7PM-7AM, please contact night-coverage at [email or web address], password [facility]

## 2015-07-12 NOTE — Consult Note (Signed)
   [facility] CM Inpatient Consult   07/12/2015  [name] 11-04-1960 [id number]  Attempted to speak with patient at bedside, to remind of [facility] program services benefit. RNCM not able to meet with patient, as he was resting and nurse requested RNCM come back another time. Will attempt to follow up later. Inpatient Case Manager aware For any questions or referrals contact:  Royetta Crochet. [name], RN, BSN, [place] [facility] Liaison

## 2015-07-13 LAB — GLUCOSE, CAPILLARY
GLUCOSE-CAPILLARY: 139 mg/dL — AB (ref 65–99)
Glucose-Capillary: 160 mg/dL — ABNORMAL HIGH (ref 65–99)
Glucose-Capillary: 126 mg/dL — ABNORMAL HIGH (ref 65–99)
Glucose-Capillary: 127 mg/dL — ABNORMAL HIGH (ref 65–99)

## 2015-07-13 LAB — CBC
HEMATOCRIT: 27.6 % — AB (ref 39.0–52.0)
Hemoglobin: 9.3 g/dL — ABNORMAL LOW (ref 13.0–17.0)
MCH: 32.3 pg (ref 26.0–34.0)
MCHC: 33.7 g/dL (ref 30.0–36.0)
MCV: 95.8 fL (ref 78.0–100.0)
PLATELETS: 54 10*3/uL — AB (ref 150–400)
RBC: 2.88 MIL/uL — ABNORMAL LOW (ref 4.22–5.81)
RDW: 14.5 % (ref 11.5–15.5)
WBC: 2.1 10*3/uL — ABNORMAL LOW (ref 4.0–10.5)

## 2015-07-13 LAB — BASIC METABOLIC PANEL
Anion gap: 10 (ref 5–15)
BUN: 27 mg/dL — ABNORMAL HIGH (ref 6–20)
CALCIUM: 8.9 mg/dL (ref 8.9–10.3)
CO2: 29 mmol/L (ref 22–32)
CREATININE: 1.42 mg/dL — AB (ref 0.61–1.24)
Chloride: 102 mmol/L (ref 101–111)
GFR calc Af Amer: >60 mL/min (ref >60)
GFR, EST NON AFRICAN AMERICAN: 55 mL/min — AB (ref >60)
GLUCOSE: 139 mg/dL — AB (ref 65–99)
Potassium: 3.7 mmol/L (ref 3.5–5.1)
Sodium: 141 mmol/L (ref 135–145)

## 2015-07-13 LAB — MAGNESIUM: Magnesium: 1.4 mg/dL — ABNORMAL LOW (ref 1.7–2.4)

## 2015-07-13 MED ORDER — LORAZEPAM 0.5 MG PO TABS
0.5000 mg | ORAL_TABLET | Freq: Once | ORAL | Status: AC
  Administered 2015-07-13: 0.5 mg via ORAL
  Filled 2015-07-13: qty 1

## 2015-07-13 MED ORDER — MAGNESIUM SULFATE IN D5W 10-5 MG/ML-% IV SOLN
1.0000 g | Freq: Once | INTRAVENOUS | Status: AC
  Administered 2015-07-13: 1 g via INTRAVENOUS
  Filled 2015-07-13: qty 100

## 2015-07-13 MED ORDER — MAGNESIUM OXIDE 400 (241.3 MG) MG PO TABS
400.0000 mg | ORAL_TABLET | Freq: Two times a day (BID) | ORAL | Status: DC
  Administered 2015-07-13 – 2015-07-17 (×8): 400 mg via ORAL
  Filled 2015-07-13 (×8): qty 1

## 2015-07-13 NOTE — Progress Notes (Signed)
[facility] PROGRESS NOTE  [name] [id number] DOB: Jul 05, 1961 DOA: 07/02/2015  PCP: [name], MD  Brief HPI: [id number] PMH alcoholic cirrhosis, hepatitis C, recent 15 day hospitalization for RLE cellulitis, presented with anasarca. Admitted for anasarca secondary to cirrhosis for IV diuresis. Also noted to have significant scrotal edema. All these are improving.  Past medical history:  Past Medical History  Diagnosis Date  . Hypertension   . Diabetes mellitus   . GERD (gastroesophageal reflux disease)     DEC 2010 EGD/Bx REACTIVE GASTROPATHY, NO VARICES  . Hemorrhoids, internal   . BMI 40.0-44.9, adult ([place]) OCT 2010 269 LBS    APR 2012 279 LBS AUG 2014 185 LBS  . Cirrhosis ([place]) NOV 2010 CHILD PUGH A    ETOH/HCV/OBESITY  . IV drug abuse REMOTE  . Hepatitis 2010 HEP C    AST 509 ALT 267 ALK PHOS 165 ALB 3.8 NEG IGM HAV/HBSAg  . Gallstone AUG 2012 1 CM  . GERD 10/25/2009  . COPD (chronic obstructive pulmonary disease) ([place])   . Hepatitis C   . Pancytopenia ([place]) 2013  . Splenomegaly 2013  . Other pancytopenia ([place]) 02/18/2013  . Iron (Fe) deficiency anemia 02/18/2013  . Splenomegaly 02/18/2013  . Neuropathy ([place])   . Biliary dyskinesia JUL 2015    HIDA GB EF 5%  . Chronic pain   . Chronic leg pain     left  . Portal venous hypertension (HCC)   . Asthma   . Medically noncompliant 05/30/2015    Consultants: Gastroenterology. Previously seen by urology. Palliative medicine  Procedures:  Echocardiogram Study Conclusions - Left ventricle: The cavity size was normal. Wall thickness wasincreased in a pattern of severe LVH. Systolic function wasnormal. The estimated ejection fraction was in the range of 60%to 65%. Wall motion was normal; there were no regional wallmotion abnormalities. Doppler parameters are consistent withabnormal left ventricular relaxation (grade 1 diastolicdysfunction). - Aortic valve: Mildly calcified annulus. Trileaflet;  mildlythickened leaflets. Valve area (VTI): 2.54 cm^2. Valve area(Vmax): 2.48 cm^2. - Technically difficult study. Definity echocontrast was used Licensed conveyancer.  Antibiotics: None  Subjective: Patient feels about the same. Overall the swelling appears to be improving, although he does have continued pain in his scrotal area as before. No nausea, vomiting. Has been getting around in his room.  Objective: Vital Signs  Filed Vitals:   07/12/15 2054 07/13/15 0444 07/13/15 0456 07/13/15 0912  BP: 153/57  158/67   Pulse: 81  84   Temp: 98.3 F (36.8 C)  98.6 F (37 C)   TempSrc: Oral  Oral   Resp: 20  20   Height:      Weight:  137.712 kg (303 lb 9.6 oz)    SpO2: 97%  97% 95%    Intake/Output Summary (Last 24 hours) at 07/13/15 0931 Last data filed at 07/13/15 0500  Gross per 24 hour  Intake   1120 ml  Output   4750 ml  Net  -3630 ml   Filed Weights   07/12/15 0523 07/12/15 0830 07/13/15 0444  Weight: 64.819 kg (142 lb 14.4 oz) 147.2 kg (324 lb 8.3 oz) 137.712 kg (303 lb 9.6 oz)    General appearance: alert, cooperative, appears stated age and no distress Resp: Diminished air entry at the bases but mostly clear to auscultation. No wheezing or rhonchi. No crackles. Cardio: regular rate and rhythm, S1, S2 normal, no murmur, click, rub or gallop GI: soft, non-tender; bowel sounds normal; no masses,  no organomegaly Extremities: 2-3+ pitting edema bilateral lower extremity. Significant scrotal edema is also noted. Chronic skin changes over her right lower extremity. Edema is improving. Neurologic: Alert and oriented 3. No focal neurological deficits are noted.  Lab Results:  Basic Metabolic Panel:  Recent Labs Lab 07/09/15 0609 07/10/15 0808 07/11/15 0639 07/12/15 0555 07/13/15 0613  NA 137 137 140 140 141  K 4.4 4.0 3.9 3.9 3.7  CL 100* 101 100* 102 102  CO2 _0 GLUCOSE 128* 131* 124* 145* 139*  BUN 42* 41* 38* 33* 27*  CREATININE 2.14*  2.05* 1.84* 1.55* 1.42*  CALCIUM 8.5* 8.5* 8.9 9.0 8.9  MG 1.7 1.6* 1.6* 1.5* 1.4*   CBC:  Recent Labs Lab 07/08/15 0542 07/10/15 0808 07/12/15 0555 07/13/15 0613  WBC 4.2 2.5* 2.8* 2.1*  HGB 9.3* 8.4* 9.1* 9.3*  HCT 27.7* 24.6* 26.6* 27.6*  MCV 95.5 95.3 95.3 95.8  PLT 50* 48* 57* 54*   BNP (last 3 results)  Recent Labs  06/11/15 1546  BNP 89.0    CBG:  Recent Labs Lab 07/12/15 0722 07/12/15 1143 07/12/15 1642 07/12/15 2039 07/13/15 0748  GLUCAP 143* 114* 120* 119* 139*    No results found for this or any previous visit (from the past 240 hour(s)).    Studies/Results: No results found.  Medications:  Scheduled: . albumin human  50 g Intravenous Daily  . furosemide  40 mg Intravenous BID  . insulin aspart  0-15 Units Subcutaneous TID WC  . insulin aspart  0-5 Units Subcutaneous QHS  . insulin aspart  4 Units Subcutaneous TID WC  . insulin glargine  10 Units Subcutaneous QHS  . lactulose  10 g Oral Daily  . magnesium oxide  400 mg Oral Daily  . pantoprazole  40 mg Oral Daily  . rifaximin  550 mg Oral BID  . sodium chloride  3 mL Intravenous Q12H  . spironolactone  50 mg Oral Daily   Continuous:  LAG:TXMIWO chloride, albuterol, cyclobenzaprine, hydrocerin, ipratropium-albuterol, ondansetron **OR** ondansetron (ZOFRAN) IV, oxyCODONE, senna-docusate, sodium chloride  Assessment/Plan:  Principal Problem:   Anasarca Active Problems:   Hepatic cirrhosis (HCC)   Diabetes (HCC)   Chronic hepatitis C with cirrhosis (HCC)   Scrotal edema   Thrombocytopenia (HCC)   Morbid obesity (HCC)   Leg pain, lateral   Cellulitis of right lower extremity   Chronic liver disease and cirrhosis ([place])   Palliative care encounter   DNR (do not resuscitate) discussion    Anasarca, secondary to cirrhosis complicated by acute diastolic CHF Continues to improve slowly, down >22 L since admission. Continue intravenous Lasix for now. Strict ins and outs and daily  weights. Weight measurements have not been accurate, although recent measurements suggest decrease in his weight. Patient also has significant scrotal edema. Scrotal support might help as it is quite tender. He did undergo scrotal ultrasound in October which revealed varicocele and hydrocele. No torsion was noted. Scrotal swelling is improving.   Decompensated alcoholic/hepatitis C cirrhosis/thrombocytopenia With associated varices, anasarca, thrombocytopenia, splenomegaly, portal venous hypertension. Continue spironolactone. Continue daily albumin infusion. GI following. Palliative medicine input is appreciated.   Acute diastolic CHF Now stable. Continue diuretics as mentioned above.   Acute kidney injury  Creatinine is improving with diuresis. He could have underlying chronic kidney disease as well. Continue to monitor urine output.  Magnesium level noted to be low. Continue supplementation.   RLE wound Skin changes stable. Wound care nurse is following. Will  need home health at discharge.  DM type 2 Continue sliding scale coverage. Continue Lantus. CBGs are well controlled.  COPD Remains stable.  Anemia of chronic disease Hemoglobin has trended down some but stable. This is likely secondary to fluid shifts. No evidence for overt bleeding. Continue to monitor for now.  Recent hospitalization for cellulitis RLE. Stable   DVT Prophylaxis: SCDs    Code Status: Full code  Family Communication: Discussed with the patient. I have explained to him that most of his symptoms will take a long time to improve and some may not improve at all considering his advanced liver disease.  Disposition Plan: Continue intravenous diuresis for now. Anticipate transition to oral diuretics over the weekend and then discharge early next week. Home health PT recommended by physical therapy.Will also need home health for wound care. Palliative medicine discussion with patient and finally is ongoing.    LOS:  11 days   [place] [facility] Pager [phone] 07/13/2015, 9:31 AM  If 7PM-7AM, please contact night-coverage at [email or web address], password [facility]

## 2015-07-13 NOTE — Progress Notes (Signed)
Have been following peripherally, does not appear to have a need for further GI inpatient follow-up. Please let us know if we can be of further help. Signing off at this time.

## 2015-07-13 NOTE — Progress Notes (Signed)
Initial Nutrition Assessment  DOCUMENTATION CODES:   Morbid obesity  INTERVENTION:  Heart Healthy diet with 1200 ml fluid restriction   NUTRITION DIAGNOSIS:   Increased nutrient needs related to chronic illness as evidenced by estimated needs.  GOAL:   Provide needs based on ASPEN/SCCM guidelines   MONITOR:   PO intake, Labs, Weight trends, Skin  REASON FOR ASSESSMENT:   LOS    ASSESSMENT: Pt is  A 54 year old male with a  PMH alcoholic cirrhosis, hepatitis C. He was hospitalized in early November for 15 day hospitalization for RLE cellulitis and now has presented with anasarca 11/21. His appetite is good and he is eating 100% of most meals. Able to feed himself.  Pt doesn't know his usual body weight and his hx is variable therefore unable to assess significant changes.  Nutrition focused exam deferred. He does still have moderate pitting edema per nursing.   Diet Order:  Diet heart healthy/carb modified Room service appropriate?: Yes; Fluid consistency:: Thin; Fluid restriction:: 1200 mL Fluid  Skin:   cellulitis  Last BM:   12/2  Height:   Ht Readings from Last 1 Encounters:  07/02/15 5\' 5"  (1.651 m)    Weight:   Wt Readings from Last 1 Encounters:  07/13/15 303 lb 9.6 oz (137.712 kg)    Ideal Body Weight:  61.8 kg  BMI:  Body mass index is 50.52 kg/(m^2).  Estimated Nutritional Needs:   Kcal:  2151-2366  Protein:  127 gr  Fluid:  1200 ml fluid restriction  EDUCATION NEEDS:   No education needs identified at this time    Colman Cater MS,RD,CSG,LDN Office: [phone] Pager: [phone]

## 2015-07-13 NOTE — Consult Note (Signed)
WOC wound consult note Reason for Consult:Ongoing scrotal edema, resolving Orders for scrotal modified today to use lavendar essential oils in NS moist kerlix to resolve scrotal edema.  Edema is resolving and scrotum is also being supported to relieve edema.  No further recommendations at this time.  Will not follow at this time.  Please re-consult if needed.  Domenic Moras RN BSN [place] Pager [phone]

## 2015-07-13 NOTE — Care Management Important Message (Signed)
Important Message  Patient Details  Name: ATZIN ONKEN MRN: [id number] Date of Birth: 1960/11/19   Medicare Important Message Given:  Yes    Joylene Draft, RN 07/13/2015, 12:59 PM

## 2015-07-13 NOTE — Care Management Note (Signed)
Case Management Note  Patient Details  Name: [name] MRN: [id number] Date of Birth: 02/09/1961  Subjective/Objective:                    Action/Plan:   Expected Discharge Date:  07/06/15               Expected Discharge Plan:  [place]  In-House Referral:  NA  Discharge planning Services  CM Consult  Post Acute Care Choice:  Resumption of Svcs/PTA Provider Choice offered to:  Patient  DME Arranged:    DME Agency:  [place] Arranged:  RN, PT, Nurse's Aide [place] Agency:     Status of Service:  Completed, signed off  Medicare Important Message Given:  Yes Date Medicare IM Given:    Medicare IM give by:    Date Additional Medicare IM Given:    Additional Medicare Important Message give by:     If discussed at [place] of Stay Meetings, dates discussed:    Additional Comments: MD stated that pt will need to continue IV lasix and albumin this weekend and discharge early next week. [name] [place], RN 07/13/2015, 1:00 PM

## 2015-07-13 NOTE — Progress Notes (Signed)
Physical Therapy Treatment Patient Details Name: [name] MRN: [id number] DOB: May 26, 1961 Today's Date: 07/13/2015    History of Present Illness Patient is a 54 year old man with multiple medical comorbidities well known for a recent long hospitalization for what was presumed to be a right lower extremity cellulitis for which he received 15 days of IV vancomycin. 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 He states that his anasarca and specifically his scrotal edema has gotten much worse in the past 6 days since discharge. It has gotten to the place where the underside of his scrotum is excoriated he was also having difficulty urinating due to the degree of edema. Because of this he comes to the hospital for evaluation. Renal function remains elevated from baseline, however slightly decreased from discharge at 2.2 to, a very decreased albumin at 2.2. We have been asked to admit him for further evaluation and management.    PT Comments    Pt found lying quietly in bed, agreeable to PT.  He reports decreased scrotal swelling but only minimally reduced pain (8/10 currently).  He is able to transfer OOB independently.  He requested to walk with his cane and was able to ambulate 37' with stable but very labored gait.  I do think his effort would be decreased using a walker and I will leave one in his room for him to use.  He is having enough pain that he is not really able to tolerate PT.  I am going to transfer gait to nursing service.  Follow Up Recommendations   (HHPT only if pt feels that he would be able to benefit from it)     Equipment Recommendations    rolling walker suitable for his weight   Recommendations for Other Services  none     Precautions / Restrictions Precautions Precautions: Fall Restrictions Weight Bearing Restrictions: No    Mobility  Bed Mobility Overal bed mobility: Modified  Independent                Transfers Overall transfer level: Modified independent Equipment used: None Transfers: Sit to/from Stand Sit to Stand: Supervision            Ambulation/Gait Ambulation/Gait assistance: Supervision Ambulation Distance (Feet): 80 Feet Assistive device: Straight cane Gait Pattern/deviations: Antalgic;Wide base of support;Decreased dorsiflexion - right;Decreased dorsiflexion - left;Shuffle   Gait velocity interpretation: <1.8 ft/sec, indicative of risk for recurrent falls General Gait Details: pt wanted to use his cane for gait...gait is extremely labored with a cane and I think that he really does need to use the walker.   Stairs            Wheelchair Mobility    Modified Rankin (Stroke Patients Only)       Balance Overall balance assessment: No apparent balance deficits (not formally assessed)                                  Cognition Arousal/Alertness: Awake/alert Behavior During Therapy: WFL for tasks assessed/performed Overall Cognitive Status: Within Functional Limits for tasks assessed                      Exercises      General Comments        Pertinent Vitals/Pain Pain Assessment: 0-10 Pain Score: 8  Pain Location: scrotum Pain Descriptors / Indicators: Sore  Pain Intervention(s): Limited activity within patient's tolerance;Monitored during session    Home Living                      Prior Function            PT Goals (current goals can now be found in the care plan section) Progress towards PT goals: Goals met/education completed, patient discharged from PT (will transition to nursing service for gait)    Frequency       PT Plan Discharge plan needs to be updated    Co-evaluation             End of Session   Activity Tolerance: Patient limited by pain Patient left: in chair;with call bell/phone within reach;with nursing/sitter in room     Time:  1431-1451 PT Time Calculation (min) (ACUTE ONLY): 20 min  Charges:  $Gait Training: 8-22 mins                    G CodesSable Castillo  PT 07/13/2015, 2:57 PM [phone]

## 2015-07-14 LAB — BASIC METABOLIC PANEL WITH GFR
Anion gap: 6 (ref 5–15)
BUN: 25 mg/dL — ABNORMAL HIGH (ref 6–20)
CO2: 29 mmol/L (ref 22–32)
Calcium: 8.7 mg/dL — ABNORMAL LOW (ref 8.9–10.3)
Chloride: 104 mmol/L (ref 101–111)
Creatinine, Ser: 1.43 mg/dL — ABNORMAL HIGH (ref 0.61–1.24)
GFR calc Af Amer: >60 mL/min (ref >60)
GFR calc non Af Amer: 55 mL/min — ABNORMAL LOW (ref >60)
Glucose, Bld: 144 mg/dL — ABNORMAL HIGH (ref 65–99)
Potassium: 3.8 mmol/L (ref 3.5–5.1)
Sodium: 139 mmol/L (ref 135–145)

## 2015-07-14 LAB — GLUCOSE, CAPILLARY
GLUCOSE-CAPILLARY: 137 mg/dL — AB (ref 65–99)
Glucose-Capillary: 151 mg/dL — ABNORMAL HIGH (ref 65–99)
Glucose-Capillary: 141 mg/dL — ABNORMAL HIGH (ref 65–99)

## 2015-07-14 LAB — MAGNESIUM: Magnesium: 1.6 mg/dL — ABNORMAL LOW (ref 1.7–2.4)

## 2015-07-14 MED ORDER — LORAZEPAM 0.5 MG PO TABS
0.5000 mg | ORAL_TABLET | Freq: Two times a day (BID) | ORAL | Status: DC | PRN
  Administered 2015-07-14 – 2015-07-16 (×6): 0.5 mg via ORAL
  Filled 2015-07-14 (×6): qty 1

## 2015-07-14 NOTE — Progress Notes (Signed)
[facility] PROGRESS NOTE  [name] [id number] DOB: 05-Jan-1961 DOA: 07/02/2015  PCP: [name], MD  Brief HPI: [id number] PMH alcoholic cirrhosis, hepatitis C, recent 15 day hospitalization for RLE cellulitis, presented with anasarca. Admitted for anasarca secondary to cirrhosis for IV diuresis. Also noted to have significant scrotal edema. All these are improving.  Past medical history:  Past Medical History  Diagnosis Date  . Hypertension   . Diabetes mellitus   . GERD (gastroesophageal reflux disease)     DEC 2010 EGD/Bx REACTIVE GASTROPATHY, NO VARICES  . Hemorrhoids, internal   . BMI 40.0-44.9, adult ([place]) OCT 2010 269 LBS    APR 2012 279 LBS AUG 2014 185 LBS  . Cirrhosis ([place]) NOV 2010 CHILD PUGH A    ETOH/HCV/OBESITY  . IV drug abuse REMOTE  . Hepatitis 2010 HEP C    AST 509 ALT 267 ALK PHOS 165 ALB 3.8 NEG IGM HAV/HBSAg  . Gallstone AUG 2012 1 CM  . GERD 10/25/2009  . COPD (chronic obstructive pulmonary disease) ([place])   . Hepatitis C   . Pancytopenia ([place]) 2013  . Splenomegaly 2013  . Other pancytopenia ([place]) 02/18/2013  . Iron (Fe) deficiency anemia 02/18/2013  . Splenomegaly 02/18/2013  . Neuropathy ([place])   . Biliary dyskinesia JUL 2015    HIDA GB EF 5%  . Chronic pain   . Chronic leg pain     left  . Portal venous hypertension (HCC)   . Asthma   . Medically noncompliant 05/30/2015    Consultants: Gastroenterology. Previously seen by urology. Palliative medicine  Procedures:  Echocardiogram Study Conclusions - Left ventricle: The cavity size was normal. Wall thickness wasincreased in a pattern of severe LVH. Systolic function wasnormal. The estimated ejection fraction was in the range of 60%to 65%. Wall motion was normal; there were no regional wallmotion abnormalities. Doppler parameters are consistent withabnormal left ventricular relaxation (grade 1 diastolicdysfunction). - Aortic valve: Mildly calcified annulus. Trileaflet;  mildlythickened leaflets. Valve area (VTI): 2.54 cm^2. Valve area(Vmax): 2.48 cm^2. - Technically difficult study. Definity echocontrast was used Licensed conveyancer.  Antibiotics: None  Subjective: Patient finally starting to feel better. He states that his scrotal pain is improving because the swelling is improving. His legs are feeling much better as well. He seems to be in better spirits today.   Objective: Vital Signs  Filed Vitals:   07/13/15 0912 07/13/15 1441 07/13/15 2125 07/14/15 0606  BP:  169/77 127/60 146/57  Pulse:  91 78 75  Temp:  98.2 F (36.8 C) 98 F (36.7 C) 98.1 F (36.7 C)  TempSrc:  Oral Oral Oral  Resp:  $Remo'20 20 20  'NYSBM$ Height:      Weight:    137 kg (302 lb 0.5 oz)  SpO2: 95% 97% 97% 100%    Intake/Output Summary (Last 24 hours) at 07/14/15 0849 Last data filed at 07/14/15 0612  Gross per 24 hour  Intake    480 ml  Output   2150 ml  Net  -1670 ml   Filed Weights   07/12/15 0830 07/13/15 0444 07/14/15 0606  Weight: 147.2 kg (324 lb 8.3 oz) 137.712 kg (303 lb 9.6 oz) 137 kg (302 lb 0.5 oz)    General appearance: alert, cooperative, and no distress Resp: Improved air entry bilaterally. No wheezing or rhonchi. No crackles. Cardio: regular rate and rhythm, S1, S2 normal, no murmur, click, rub or gallop GI: soft, non-tender; bowel sounds normal; no masses,  no organomegaly Extremities: Improving  edema bilateral lower extremity. Still about 2+. Continues to have significant scrotal edema but much better than what it was 3 days ago. Chronic skin changes over her right lower extremity. Neurologic: Alert and oriented 3. No focal neurological deficits are noted.  Lab Results:  Basic Metabolic Panel:  Recent Labs Lab 07/10/15 0808 07/11/15 0639 07/12/15 0555 07/13/15 0613 07/14/15 0604  NA 137 140 140 141 139  K 4.0 3.9 3.9 3.7 3.8  CL 101 100* 102 102 104  CO2 $Re'28 28 28 29 29  'Nxy$ GLUCOSE 131* 124* 145* 139* 144*  BUN 41* 38* 33* 27* 25*    CREATININE 2.05* 1.84* 1.55* 1.42* 1.43*  CALCIUM 8.5* 8.9 9.0 8.9 8.7*  MG 1.6* 1.6* 1.5* 1.4* 1.6*   CBC:  Recent Labs Lab 07/08/15 0542 07/10/15 0808 07/12/15 0555 07/13/15 0613  WBC 4.2 2.5* 2.8* 2.1*  HGB 9.3* 8.4* 9.1* 9.3*  HCT 27.7* 24.6* 26.6* 27.6*  MCV 95.5 95.3 95.3 95.8  PLT 50* 48* 57* 54*   BNP (last 3 results)  Recent Labs  06/11/15 1546  BNP 89.0    CBG:  Recent Labs Lab 07/13/15 0748 07/13/15 1131 07/13/15 1648 07/13/15 2130 07/14/15 0808  GLUCAP 139* 160* 126* 127* 137*    No results found for this or any previous visit (from the past 240 hour(s)).    Studies/Results: No results found.  Medications:  Scheduled: . albumin human  50 g Intravenous Daily  . furosemide  40 mg Intravenous BID  . insulin aspart  0-15 Units Subcutaneous TID WC  . insulin aspart  0-5 Units Subcutaneous QHS  . insulin aspart  4 Units Subcutaneous TID WC  . insulin glargine  10 Units Subcutaneous QHS  . lactulose  10 g Oral Daily  . magnesium oxide  400 mg Oral BID  . pantoprazole  40 mg Oral Daily  . rifaximin  550 mg Oral BID  . sodium chloride  3 mL Intravenous Q12H  . spironolactone  50 mg Oral Daily   Continuous:  PNP:YYFRTM chloride, albuterol, cyclobenzaprine, hydrocerin, ipratropium-albuterol, ondansetron **OR** ondansetron (ZOFRAN) IV, oxyCODONE, senna-docusate, sodium chloride  Assessment/Plan:  Principal Problem:   Anasarca Active Problems:   Hepatic cirrhosis (HCC)   Diabetes (HCC)   Chronic hepatitis C with cirrhosis (HCC)   Scrotal edema   Thrombocytopenia (HCC)   Morbid obesity (HCC)   Leg pain, lateral   Cellulitis of right lower extremity   Chronic liver disease and cirrhosis ([place])   Palliative care encounter   DNR (do not resuscitate) discussion    Anasarca, secondary to cirrhosis complicated by acute diastolic CHF Patient is diuresing quite well. His edema is improving. He is in negative balance >24 L since admission.  Continue intravenous Lasix for now. Anticipate change to oral diuretics in the next 24-36 hours. Strict ins and outs and daily weights. Weight measurements have not been accurate, although recent measurements suggest decrease in his weight. Patient also has significant scrotal edema. Scrotal support appears to be helping. He did undergo scrotal ultrasound in October which revealed varicocele and hydrocele. No torsion was noted. Scrotal swelling is improving. Foley catheter will ideally need to be removed prior to discharge.  Decompensated alcoholic/hepatitis C cirrhosis/thrombocytopenia With associated varices, anasarca, thrombocytopenia, splenomegaly, portal venous hypertension. Continue spironolactone. Continue daily albumin infusion. GI has been following. Palliative medicine input is appreciated. Long-term prognosis remains poor.  Acute diastolic CHF Now stable. Continue diuretics as mentioned above.   Acute kidney injury  Creatinine is improving with  diuresis. He could have underlying chronic kidney disease as well. Continue to monitor urine output.  Magnesium level noted to be low. Continue supplementation.   RLE wound Skin changes stable. Wound care nurse is following. Will need home health at discharge.  DM type 2 Continue sliding scale coverage. Continue Lantus. CBGs are well controlled.  COPD Remains stable.  Anemia of chronic disease Hemoglobin has trended down some but stable. This is likely secondary to fluid shifts. No evidence for overt bleeding. Continue to monitor for now.  Recent hospitalization for cellulitis RLE. Stable   DVT Prophylaxis: SCDs    Code Status: Full code  Family Communication: Discussed with the patient. I have explained to him that most of his symptoms will take a long time to improve and some may not improve at all considering his advanced liver disease.  Disposition Plan: Continue intravenous diuresis for now. Anticipate transition to oral  diuretics in the next 24-36 hours and then discharge early next week. Home health PT recommended by physical therapy.Will also need home health for wound care. Palliative medicine discussion with patient and finally is ongoing.    LOS: 12 days   [place] [facility] Pager [phone] 07/14/2015, 8:49 AM  If 7PM-7AM, please contact night-coverage at [email or web address], password [facility]

## 2015-07-15 LAB — BASIC METABOLIC PANEL
Anion gap: 7 (ref 5–15)
BUN: 24 mg/dL — AB (ref 6–20)
CALCIUM: 8.6 mg/dL — AB (ref 8.9–10.3)
CO2: 29 mmol/L (ref 22–32)
Chloride: 101 mmol/L (ref 101–111)
Creatinine, Ser: 1.42 mg/dL — ABNORMAL HIGH (ref 0.61–1.24)
GFR calc Af Amer: >60 mL/min (ref >60)
GFR, EST NON AFRICAN AMERICAN: 55 mL/min — AB (ref >60)
GLUCOSE: 158 mg/dL — AB (ref 65–99)
Potassium: 3.9 mmol/L (ref 3.5–5.1)
Sodium: 137 mmol/L (ref 135–145)

## 2015-07-15 LAB — GLUCOSE, CAPILLARY
Glucose-Capillary: 142 mg/dL — ABNORMAL HIGH (ref 65–99)
Glucose-Capillary: 148 mg/dL — ABNORMAL HIGH (ref 65–99)
Glucose-Capillary: 159 mg/dL — ABNORMAL HIGH (ref 65–99)
Glucose-Capillary: 126 mg/dL — ABNORMAL HIGH (ref 65–99)
Glucose-Capillary: 133 mg/dL — ABNORMAL HIGH (ref 65–99)

## 2015-07-15 LAB — MAGNESIUM: MAGNESIUM: 1.7 mg/dL (ref 1.7–2.4)

## 2015-07-15 NOTE — Progress Notes (Signed)
[facility] PROGRESS NOTE  FREDIS MALKIEWICZ [id number] DOB: 1960-09-20 DOA: 07/02/2015  PCP: [name], MD  Brief HPI: [id number] PMH alcoholic cirrhosis, hepatitis C, recent 15 day hospitalization for RLE cellulitis, presented with anasarca. Admitted for anasarca secondary to cirrhosis for IV diuresis. Also noted to have significant scrotal edema. All these are improving.  Past medical history:  Past Medical History  Diagnosis Date  . Hypertension   . Diabetes mellitus   . GERD (gastroesophageal reflux disease)     DEC 2010 EGD/Bx REACTIVE GASTROPATHY, NO VARICES  . Hemorrhoids, internal   . BMI 40.0-44.9, adult ([place]) OCT 2010 269 LBS    APR 2012 279 LBS AUG 2014 185 LBS  . Cirrhosis ([place]) NOV 2010 CHILD PUGH A    ETOH/HCV/OBESITY  . IV drug abuse REMOTE  . Hepatitis 2010 HEP C    AST 509 ALT 267 ALK PHOS 165 ALB 3.8 NEG IGM HAV/HBSAg  . Gallstone AUG 2012 1 CM  . GERD 10/25/2009  . COPD (chronic obstructive pulmonary disease) ([place])   . Hepatitis C   . Pancytopenia ([place]) 2013  . Splenomegaly 2013  . Other pancytopenia ([place]) 02/18/2013  . Iron (Fe) deficiency anemia 02/18/2013  . Splenomegaly 02/18/2013  . Neuropathy ([place])   . Biliary dyskinesia JUL 2015    HIDA GB EF 5%  . Chronic pain   . Chronic leg pain     left  . Portal venous hypertension (HCC)   . Asthma   . Medically noncompliant 05/30/2015    Consultants: Gastroenterology. Previously seen by urology. Palliative medicine  Procedures:  Echocardiogram Study Conclusions - Left ventricle: The cavity size was normal. Wall thickness wasincreased in a pattern of severe LVH. Systolic function wasnormal. The estimated ejection fraction was in the range of 60%to 65%. Wall motion was normal; there were no regional wallmotion abnormalities. Doppler parameters are consistent withabnormal left ventricular relaxation (grade 1 diastolicdysfunction). - Aortic valve: Mildly calcified annulus. Trileaflet;  mildlythickened leaflets. Valve area (VTI): 2.54 cm^2. Valve area(Vmax): 2.48 cm^2. - Technically difficult study. Definity echocontrast was used Licensed conveyancer.  Antibiotics: None  Subjective: Patient feeling better. States that his swelling is much improved. Continues to have some discomfort in his scrotal area. But not as severe as it was.  Objective: Vital Signs  Filed Vitals:   07/14/15 1422 07/14/15 2140 07/15/15 0445 07/15/15 0456  BP: 142/56 136/63  142/62  Pulse: 79 82  73  Temp: 98.4 F (36.9 C) 99 F (37.2 C)  98.2 F (36.8 C)  TempSrc: Oral Oral  Oral  Resp: $Remo'20 20  20  'vPKQs$ Height:      Weight:   136 kg (299 lb 13.2 oz)   SpO2: 97% 96%  100%    Intake/Output Summary (Last 24 hours) at 07/15/15 0821 Last data filed at 07/14/15 2151  Gross per 24 hour  Intake    720 ml  Output   2250 ml  Net  -1530 ml   Filed Weights   07/13/15 0444 07/14/15 0606 07/15/15 0445  Weight: 137.712 kg (303 lb 9.6 oz) 137 kg (302 lb 0.5 oz) 136 kg (299 lb 13.2 oz)    General appearance: alert, cooperative, and no distress Resp: Improved air entry bilaterally. No wheezing or rhonchi. No crackles. Cardio: regular rate and rhythm, S1, S2 normal, no murmur, click, rub or gallop GI: soft, non-tender; bowel sounds normal; no masses,  no organomegaly Extremities: Lower extremity edema as well as scrotal swelling is improved. Chronic  skin changes noted in the lower extremity. Neurologic: Alert and oriented 3. No focal neurological deficits are noted.  Lab Results:  Basic Metabolic Panel:  Recent Labs Lab 07/11/15 0639 07/12/15 0555 07/13/15 0613 07/14/15 0604 07/15/15 0555  NA 140 140 141 139 137  K 3.9 3.9 3.7 3.8 3.9  CL 100* 102 102 104 101  CO2 $Re'28 28 29 29 29  'sSm$ GLUCOSE 124* 145* 139* 144* 158*  BUN 38* 33* 27* 25* 24*  CREATININE 1.84* 1.55* 1.42* 1.43* 1.42*  CALCIUM 8.9 9.0 8.9 8.7* 8.6*  MG 1.6* 1.5* 1.4* 1.6* 1.7   CBC:  Recent Labs Lab 07/10/15 0808  07/12/15 0555 07/13/15 0613  WBC 2.5* 2.8* 2.1*  HGB 8.4* 9.1* 9.3*  HCT 24.6* 26.6* 27.6*  MCV 95.3 95.3 95.8  PLT 48* 57* 54*   BNP (last 3 results)  Recent Labs  06/11/15 1546  BNP 89.0    CBG:  Recent Labs Lab 07/13/15 2130 07/14/15 0808 07/14/15 1201 07/14/15 1717 07/14/15 2137  GLUCAP 127* 137* 151* 141* 142*    No results found for this or any previous visit (from the past 240 hour(s)).    Studies/Results: No results found.  Medications:  Scheduled: . albumin human  50 g Intravenous Daily  . furosemide  40 mg Intravenous BID  . insulin aspart  0-15 Units Subcutaneous TID WC  . insulin aspart  0-5 Units Subcutaneous QHS  . insulin aspart  4 Units Subcutaneous TID WC  . insulin glargine  10 Units Subcutaneous QHS  . lactulose  10 g Oral Daily  . magnesium oxide  400 mg Oral BID  . pantoprazole  40 mg Oral Daily  . rifaximin  550 mg Oral BID  . sodium chloride  3 mL Intravenous Q12H  . spironolactone  50 mg Oral Daily   Continuous:  ESP:QZRAQT chloride, albuterol, cyclobenzaprine, hydrocerin, ipratropium-albuterol, LORazepam, ondansetron **OR** ondansetron (ZOFRAN) IV, oxyCODONE, senna-docusate, sodium chloride  Assessment/Plan:  Principal Problem:   Anasarca Active Problems:   Hepatic cirrhosis (HCC)   Diabetes (HCC)   Chronic hepatitis C with cirrhosis (HCC)   Scrotal edema   Thrombocytopenia (HCC)   Morbid obesity (HCC)   Leg pain, lateral   Cellulitis of right lower extremity   Chronic liver disease and cirrhosis ([place])   Palliative care encounter   DNR (do not resuscitate) discussion    Anasarca, secondary to cirrhosis complicated by acute diastolic CHF Patient continues to diurese well. Edema in both his lower extremities as well as the scrotal area is improving. He is in negative balance >26 L since admission. Continue intravenous Lasix for another day. Anticipate change to oral diuretics tomorrow. Strict ins and outs and daily  weights. Weight measurements have not been accurate, although recent measurements suggest decrease in his weight. He did undergo scrotal ultrasound in October which revealed varicocele and hydrocele. No torsion was noted. Scrotal swelling is improving. Foley catheter will ideally need to be removed prior to discharge. We will do a voiding trial tomorrow.  Decompensated alcoholic/hepatitis C cirrhosis/thrombocytopenia With associated varices, anasarca, thrombocytopenia, splenomegaly, portal venous hypertension. Continue spironolactone. Continue daily albumin infusion till he is in the hospital. GI has been following. Palliative medicine input is appreciated. Long-term prognosis remains poor.  Acute diastolic CHF Now stable. Continue diuretics as mentioned above.   Acute kidney injury  Creatinine is improving with diuresis. He could have underlying chronic kidney disease as well. Continue to monitor urine output.  Magnesium level is improved with supplementation.  RLE wound Skin changes stable. Wound care nurse is following. Will need home health at discharge.  DM type 2 Continue sliding scale coverage. Continue Lantus. CBGs are well controlled.  COPD Remains stable.  Anemia of chronic disease Hemoglobin has trended down some but stable. This is likely secondary to fluid shifts. No evidence for overt bleeding. Continue to monitor for now.  Recent hospitalization for cellulitis RLE. Stable   DVT Prophylaxis: SCDs    Code Status: Full code  Family Communication: Discussed with the patient.   Disposition Plan: Continue intravenous diuresis for now. Anticipate transition to oral diuretics tomorrow and then discharge on Tuesday. Home health PT recommended by physical therapy.Will also need home health for wound care. Palliative medicine discussion with patient and family is ongoing.    LOS: 13 days   [place] [facility] Pager [phone] 07/15/2015, 8:21 AM  If  7PM-7AM, please contact night-coverage at [email or web address], password [email or web address]

## 2015-07-15 NOTE — Progress Notes (Deleted)
CRITICAL VALUE ALERT  Critical value received:  Positive c. diff  Date of notification:  07/15/15  Time of notification:  0930  Critical value read back:Yes.    Nurse who received alert:  Jerrye Noble RN  MD notified (1st page):  Dr. [place] Pink  Time of first page:  [phone]  MD notified (2nd page):  Time of second page:  Responding MD:  [place] Pink  Time MD responded:  Appropriate orders already in place. Dr. Curly Rim and notified of positive confirmation

## 2015-07-15 NOTE — Progress Notes (Signed)
lavendar compresses applied to scrotum.

## 2015-07-15 NOTE — Progress Notes (Signed)
Pt c/o sob. vitals taken sat's at 100 on Ra vitals as follows : BP 136/56 Pulse 81 temp 98.6. No signs of active respiratory distress.

## 2015-07-15 NOTE — Progress Notes (Signed)
Xeroform gauze dressing changed to R lower leg and lavender compresses and gauze wrap applied to scrotum

## 2015-07-16 LAB — GLUCOSE, CAPILLARY
GLUCOSE-CAPILLARY: 149 mg/dL — AB (ref 65–99)
Glucose-Capillary: 184 mg/dL — ABNORMAL HIGH (ref 65–99)
Glucose-Capillary: 172 mg/dL — ABNORMAL HIGH (ref 65–99)
Glucose-Capillary: 152 mg/dL — ABNORMAL HIGH (ref 65–99)

## 2015-07-16 LAB — BASIC METABOLIC PANEL
Anion gap: 6 (ref 5–15)
BUN: 24 mg/dL — ABNORMAL HIGH (ref 6–20)
CALCIUM: 8.6 mg/dL — AB (ref 8.9–10.3)
CO2: 27 mmol/L (ref 22–32)
CREATININE: 1.38 mg/dL — AB (ref 0.61–1.24)
Chloride: 104 mmol/L (ref 101–111)
GFR, EST NON AFRICAN AMERICAN: 57 mL/min — AB (ref >60)
GLUCOSE: 163 mg/dL — AB (ref 65–99)
Potassium: 4.1 mmol/L (ref 3.5–5.1)
Sodium: 137 mmol/L (ref 135–145)

## 2015-07-16 LAB — MAGNESIUM: MAGNESIUM: 1.7 mg/dL (ref 1.7–2.4)

## 2015-07-16 MED ORDER — FUROSEMIDE 80 MG PO TABS
80.0000 mg | ORAL_TABLET | Freq: Two times a day (BID) | ORAL | Status: DC
  Administered 2015-07-16 – 2015-07-17 (×2): 80 mg via ORAL
  Filled 2015-07-16 (×2): qty 1

## 2015-07-16 NOTE — Care Management Note (Signed)
Case Management Note  Patient Details  Name: [name] MRN: [id number] Date of Birth: 12-01-1960  Subjective/Objective:                    Action/Plan:   Expected Discharge Date:  07/06/15               Expected Discharge Plan:  [name]  In-House Referral:  NA  Discharge planning Services  CM Consult  Post Acute Care Choice:  Resumption of Svcs/PTA Provider Choice offered to:  Patient  DME Arranged:    DME Agency:  [place] Arranged:  RN, PT, Nurse's Aide [place] Agency:     Status of Service:  Completed, signed off  Medicare Important Message Given:  Yes Date Medicare IM Given:    Medicare IM give by:    Date Additional Medicare IM Given:    Additional Medicare Important Message give by:     If discussed at [place] of Stay Meetings, dates discussed:    Additional Comments: Pt changed to po lasix. Anticipate discharge within 24 hours with [facility] RN and PT. [name] [place], RN 07/16/2015, 3:10 PM

## 2015-07-16 NOTE — Progress Notes (Signed)
[facility] PROGRESS NOTE  [name] PNT:[id number] DOB: June 18, 1961 DOA: 07/02/2015  PCP: [name], MD  Brief HPI: [id number] PMH alcoholic cirrhosis, hepatitis C, recent 15 day hospitalization for RLE cellulitis, presented with anasarca. Admitted for anasarca secondary to cirrhosis for IV diuresis. Also noted to have significant scrotal edema. All these are improving.  Past medical history:  Past Medical History  Diagnosis Date  . Hypertension   . Diabetes mellitus   . GERD (gastroesophageal reflux disease)     DEC 2010 EGD/Bx REACTIVE GASTROPATHY, NO VARICES  . Hemorrhoids, internal   . BMI 40.0-44.9, adult ([place]) OCT 2010 269 LBS    APR 2012 279 LBS AUG 2014 185 LBS  . Cirrhosis ([place]) NOV 2010 CHILD PUGH A    ETOH/HCV/OBESITY  . IV drug abuse REMOTE  . Hepatitis 2010 HEP C    AST 509 ALT 267 ALK PHOS 165 ALB 3.8 NEG IGM HAV/HBSAg  . Gallstone AUG 2012 1 CM  . GERD 10/25/2009  . COPD (chronic obstructive pulmonary disease) ([place])   . Hepatitis C   . Pancytopenia ([place]) 2013  . Splenomegaly 2013  . Other pancytopenia ([place]) 02/18/2013  . Iron (Fe) deficiency anemia 02/18/2013  . Splenomegaly 02/18/2013  . Neuropathy ([place])   . Biliary dyskinesia JUL 2015    HIDA GB EF 5%  . Chronic pain   . Chronic leg pain     left  . Portal venous hypertension (HCC)   . Asthma   . Medically noncompliant 05/30/2015    Consultants: Gastroenterology. Previously seen by urology. Palliative medicine  Procedures:  Echocardiogram Study Conclusions - Left ventricle: The cavity size was normal. Wall thickness wasincreased in a pattern of severe LVH. Systolic function wasnormal. The estimated ejection fraction was in the range of 60%to 65%. Wall motion was normal; there were no regional wallmotion abnormalities. Doppler parameters are consistent withabnormal left ventricular relaxation (grade 1 diastolicdysfunction). - Aortic valve: Mildly calcified annulus. Trileaflet;  mildlythickened leaflets. Valve area (VTI): 2.54 cm^2. Valve area(Vmax): 2.48 cm^2. - Technically difficult study. Definity echocontrast was used Licensed conveyancer.  Antibiotics: None  Subjective: Patient finally starting to feel better. He states that his scrotal pain is improving because the swelling is improving. His legs are feeling much better as well. He seems to be in better spirits today.   Objective: Vital Signs  Filed Vitals:   07/15/15 2109 07/15/15 2306 07/16/15 0600 07/16/15 0715  BP: 132/46  148/61   Pulse: 96  78   Temp: 99 F (37.2 C)  97.7 F (36.5 C)   TempSrc: Oral  Oral   Resp: 20  20   Height:      Weight:    135.626 kg (299 lb)  SpO2: 97% 98% 95%     Intake/Output Summary (Last 24 hours) at 07/16/15 1138 Last data filed at 07/16/15 0900  Gross per 24 hour  Intake    720 ml  Output   1200 ml  Net   -480 ml   Filed Weights   07/14/15 0606 07/15/15 0445 07/16/15 0715  Weight: 137 kg (302 lb 0.5 oz) 136 kg (299 lb 13.2 oz) 135.626 kg (299 lb)    General appearance: alert, cooperative, and no distress Resp: Clear to auscultation bilaterally Cardio: regular rate and rhythm, S1, S2 normal, no murmur, click, rub or gallop GI: soft, non-tender; bowel sounds normal; no masses,  no organomegaly Extremities: Improving edema bilateral lower extremity. Still about 2+. Continues to have scrotal edema  but much better than what it was earlier. Chronic skin changes over her right lower extremity. Neurologic: Alert and oriented 3. No focal neurological deficits are noted.  Lab Results:  Basic Metabolic Panel:  Recent Labs Lab 07/12/15 0555 07/13/15 0613 07/14/15 0604 07/15/15 0555 07/16/15 0628  NA 140 141 139 137 137  K 3.9 3.7 3.8 3.9 4.1  CL 102 102 104 101 104  CO2 _0 GLUCOSE 145* 139* 144* 158* 163*  BUN 33* 27* 25* 24* 24*  CREATININE 1.55* 1.42* 1.43* 1.42* 1.38*  CALCIUM 9.0 8.9 8.7* 8.6* 8.6*  MG 1.5* 1.4* 1.6* 1.7 1.7    CBC:  Recent Labs Lab 07/10/15 0808 07/12/15 0555 07/13/15 0613  WBC 2.5* 2.8* 2.1*  HGB 8.4* 9.1* 9.3*  HCT 24.6* 26.6* 27.6*  MCV 95.3 95.3 95.8  PLT 48* 57* 54*   BNP (last 3 results)  Recent Labs  06/11/15 1546  BNP 89.0    CBG:  Recent Labs Lab 07/15/15 0846 07/15/15 1203 07/15/15 1655 07/15/15 2050 07/16/15 0728  GLUCAP 148* 159* 126* 133* 149*    No results found for this or any previous visit (from the past 240 hour(s)).    Studies/Results: No results found.  Medications:  Scheduled: . albumin human  50 g Intravenous Daily  . furosemide  80 mg Oral BID  . insulin aspart  0-15 Units Subcutaneous TID WC  . insulin aspart  0-5 Units Subcutaneous QHS  . insulin aspart  4 Units Subcutaneous TID WC  . insulin glargine  10 Units Subcutaneous QHS  . lactulose  10 g Oral Daily  . magnesium oxide  400 mg Oral BID  . pantoprazole  40 mg Oral Daily  . rifaximin  550 mg Oral BID  . sodium chloride  3 mL Intravenous Q12H  . spironolactone  50 mg Oral Daily   Continuous:  EPP:IRJJOA chloride, albuterol, cyclobenzaprine, hydrocerin, ipratropium-albuterol, LORazepam, ondansetron **OR** ondansetron (ZOFRAN) IV, oxyCODONE, senna-docusate, sodium chloride  Assessment/Plan:  Principal Problem:   Anasarca Active Problems:   Hepatic cirrhosis (HCC)   Diabetes (HCC)   Chronic hepatitis C with cirrhosis (HCC)   Scrotal edema   Thrombocytopenia (HCC)   Morbid obesity (HCC)   Leg pain, lateral   Cellulitis of right lower extremity   Chronic liver disease and cirrhosis ([place])   Palliative care encounter   DNR (do not resuscitate) discussion    Anasarca, secondary to cirrhosis complicated by acute diastolic CHF Patient is diuresing quite well. His edema is improving. He is in negative balance >28 L since admission. Change to oral Lasix today. Discontinue Foley later today. Strict ins and outs and daily weights. Weight measurements have not been accurate,  although recent measurements suggest decrease in his weight. Patient also has scrotal edema. It has significantly improved. He did undergo scrotal ultrasound in October which revealed varicocele and hydrocele. No torsion was noted.   Decompensated alcoholic/hepatitis C cirrhosis/thrombocytopenia With associated varices, anasarca, thrombocytopenia, splenomegaly, portal venous hypertension. Continue spironolactone. Continue daily albumin infusion. GI has been following. Palliative medicine input is appreciated. Long-term prognosis remains poor.  Acute diastolic CHF Now stable. Continue diuretics as mentioned above.   Acute kidney injury  Creatinine is improving with diuresis. He could have underlying chronic kidney disease as well. Continue to monitor urine output.  Magnesium level is now normal. Continue supplementation.   RLE wound Skin changes stable. Wound care nurse is following. Will need home health at discharge.  DM type 2  Continue sliding scale coverage. Continue Lantus. CBGs are well controlled.  COPD Remains stable.  Anemia of chronic disease Hemoglobin has trended down some but stable. This is likely secondary to fluid shifts. No evidence for overt bleeding. Continue to monitor for now.  Recent hospitalization for cellulitis RLE. Stable   DVT Prophylaxis: SCDs    Code Status: Full code  Family Communication: Discussed with the patient. I have explained to him that most of his symptoms will take a long time to improve and some may not improve at all considering his advanced liver disease.  Disposition Plan: Change to oral diuretics today. Discontinue Foley catheter today. Anticipate discharge tomorrow. Home health PT recommended by physical therapy.Will also need home health for wound care. Palliative medicine discussion with patient and family is ongoing.    LOS: 14 days   [facility] Pager [phone] 07/16/2015, 11:38 AM  If 7PM-7AM, please  contact night-coverage at [email or web address], password [facility]

## 2015-07-16 NOTE — Progress Notes (Signed)
Daily Progress Note   Patient Name: [name]       Date: 07/16/2015 DOB: 12/24/1960  Age: 54 y.o. MRN#: [id number] Attending Physician: Bonnielee Haff, MD Primary Care Physician: [name], MD Admit Date: 07/02/2015  Reason for Consultation/Follow-up: Establishing goals of care and Psychosocial/spiritual support  Subjective: Meeting today with [name].  We talk about his decreased swelling rt albumin.  He tells me that he is feeing better and will have his foley catheter out today, and if possible, wash up and walk in the hallway some.  We talk about the likelihood of reoccur ance for this swelling and that this is part of his disease process and not necessarily something he has done wrong. I talk about coming back in the hospital in the future, and he tells me that he doesn't want to come back, didn't want to come this time, but realizes that he may have to return.   I ask who will care for him when he goes home.  [name] for GI and Dr. Sabra Heck for PCP.   [name] tells me that he got saved this morning.  He tells me that he lost faith after his parents died.  He tells me that his pastor [name] came and talked with him.  He agrees for me to call his wife.   [name] and I talk about his chronic illness, and the likelihood that he will have swelling again. I share that he had foley removed today.  [name] tells me that she doesn't want him to have pain.  [name] shares that he told her he doesn't think he will be here Christmas (this is after he had been in the hospital/ not before).  We talk about prognosis (unable to say at this time). [name] tells me about the doctor in [place] telling him that he had 3-6 months.   [name] asks about services to help, and we talk about [facility] services vs Hospice.    I share that they have a right to say what this time looks like.   [name] tells me that he is estranged from his daughter.   We plan to meet face to face tomorrow.   Length of Stay: 14 days  Current  Medications: Scheduled Meds:  . albumin human  50 g Intravenous Daily  . furosemide  80 mg Oral BID  . insulin aspart  0-15 Units Subcutaneous TID WC  . insulin aspart  0-5 Units Subcutaneous QHS  . insulin aspart  4 Units Subcutaneous TID WC  . insulin glargine  10 Units Subcutaneous QHS  . lactulose  10 g Oral Daily  . magnesium oxide  400 mg Oral BID  . pantoprazole  40 mg Oral Daily  . rifaximin  550 mg Oral BID  . sodium chloride  3 mL Intravenous Q12H  . spironolactone  50 mg Oral Daily    Continuous Infusions:    PRN Meds: sodium chloride, albuterol, cyclobenzaprine, hydrocerin, ipratropium-albuterol, LORazepam, ondansetron **OR** ondansetron (ZOFRAN) IV, oxyCODONE, senna-docusate, sodium chloride  Palliative Performance Scale: 40% at this time.      Vital Signs: BP 148/61 mmHg  Pulse 78  Temp(Src) 97.7 F (36.5 C) (Oral)  Resp 20  Ht 5\' 5"  (1.651 m)  Wt 135.626 kg (299 lb)  BMI 49.76 kg/m2  SpO2 95% SpO2: SpO2: 95 % O2 Device: O2 Device: Not Delivered O2 Flow Rate:    Intake/output summary:  Intake/Output Summary (Last 24 hours) at 07/16/15 1553 Last data  filed at 07/16/15 0900  Gross per 24 hour  Intake    480 ml  Output   1200 ml  Net   -720 ml   LBM:   Baseline Weight: Weight: (!) 150.141 kg (331 lb) Most recent weight: Weight: 135.626 kg (299 lb)  Physical Exam:  Constitutional: Ill appearing. He is oriented to person, place, and time.  Obese. In no distress.    Cardiovascular: Normal rate, regular rhythm and normal heart sounds. Edema improved.  Pulmonary/Chest: Effort normal and breath sounds normal. He has no wheezes.   Abdominal: Soft, obese, no tenderness/guarding. Neurological: He is alert and oriented to person, place, and time.  Skin: Skin is warm and dry. Multiple small scratches on arms and abd.  RLL with drsg CDI.   Nursing note and vitals reviewed.   Additional Data Reviewed: Recent Labs     07/15/15  0555  07/16/15  0628  NA   137  137  BUN  24*  24*  CREATININE  1.42*  1.38*     Problem List:  Patient Active Problem List   Diagnosis Date Noted  . Palliative care encounter   . DNR (do not resuscitate) discussion   . Chronic liver disease and cirrhosis ([place])   . Leg pain, lateral   . Cellulitis of right lower extremity   . Anasarca 07/02/2015  . Morbid obesity ([place]) 07/02/2015  . Thrombocytopenia ([place]) 06/13/2015  . Cellulitis 06/11/2015  . Cellulitis of right leg 06/11/2015  . Esophageal varices ([place]) 05/31/2015  . Obesity, Class III, BMI 40-49.9 (morbid obesity) ([place]) 05/31/2015  . Medically noncompliant 05/30/2015  . Abnormal LFTs   . Scrotal edema 01/16/2015  . Ascites 01/15/2015  . Traumatic ecchymosis of chest 10/26/2014  . Chronic hepatitis C with cirrhosis ([place])   . Diabetes ([place]) 07/31/2014  . Encephalopathy, hepatic ([place]) 01/29/2014  . Angiodysplasia of colon 12/27/2013  . Hematochezia 11/03/2013  . Other pancytopenia ([place]) 02/18/2013  . Iron (Fe) deficiency anemia 02/18/2013  . Colon cancer screening 08/28/2011  . DM 02/15/2010  . GERD 10/25/2009  . Hepatic cirrhosis ([place]) 10/25/2009  . ALCOHOL ABUSE 06/05/2009  . UNSPECIFIED DISEASE OF PANCREAS 06/05/2009     Palliative Care Assessment & Plan    Code Status:  Full code  Goals of Care:  Return home with [facility] at this time.   Symptom Management:  Zofran 4 mg PO/IV Q 6 hours PRN  Palliative Prophylaxis:  Senna-S 1 Q HS PRN  Psycho-social/Spiritual:  Desire for further Chaplaincy support:no   Prognosis: Unable to determine Discharge Planning: Home with [place] was discussed with nursing staff, CM, SW, and Dr. [place] Pink.   Thank you for allowing the Palliative Medicine Team to assist in the care of this patient.   Time In: 1430 Time Out: 1500 Total Time 30 minutes Prolonged Time Billed  no     Greater than 50%  of this time was spent counseling and coordinating care related to the above  assessment and plan.   Drue Novel, NP  07/16/2015, 3:53 PM  Please contact Palliative Medicine Team phone at [phone] for questions and concerns.

## 2015-07-17 ENCOUNTER — Other Ambulatory Visit: Payer: Self-pay | Admitting: *Deleted

## 2015-07-17 LAB — BASIC METABOLIC PANEL
Anion gap: 5 (ref 5–15)
BUN: 24 mg/dL — AB (ref 6–20)
CALCIUM: 8.7 mg/dL — AB (ref 8.9–10.3)
CO2: 27 mmol/L (ref 22–32)
CREATININE: 1.29 mg/dL — AB (ref 0.61–1.24)
Chloride: 106 mmol/L (ref 101–111)
GFR calc Af Amer: >60 mL/min (ref >60)
GLUCOSE: 155 mg/dL — AB (ref 65–99)
POTASSIUM: 4.1 mmol/L (ref 3.5–5.1)
Sodium: 138 mmol/L (ref 135–145)

## 2015-07-17 LAB — GLUCOSE, CAPILLARY
Glucose-Capillary: 160 mg/dL — ABNORMAL HIGH (ref 65–99)
Glucose-Capillary: 200 mg/dL — ABNORMAL HIGH (ref 65–99)

## 2015-07-17 LAB — MAGNESIUM: MAGNESIUM: 1.7 mg/dL (ref 1.7–2.4)

## 2015-07-17 MED ORDER — LORAZEPAM 0.5 MG PO TABS: 0.5000 mg | ORAL_TABLET | Freq: Two times a day (BID) | ORAL | Status: DC | PRN

## 2015-07-17 MED ORDER — SPIRONOLACTONE 50 MG PO TABS: 50.0000 mg | ORAL_TABLET | Freq: Every day | ORAL | Status: AC

## 2015-07-17 MED ORDER — LACTULOSE 10 GM/15ML PO SOLN: 10.0000 g | Freq: Every day | ORAL | Status: DC

## 2015-07-17 MED ORDER — FUROSEMIDE 40 MG PO TABS: 80.0000 mg | ORAL_TABLET | Freq: Two times a day (BID) | ORAL | Status: DC

## 2015-07-17 MED ORDER — MAGNESIUM OXIDE 400 (241.3 MG) MG PO TABS: 400.0000 mg | ORAL_TABLET | Freq: Two times a day (BID) | ORAL | Status: DC

## 2015-07-17 MED ORDER — OXYCODONE HCL 5 MG PO TABS: 5.0000 mg | ORAL_TABLET | Freq: Four times a day (QID) | ORAL | Status: DC | PRN

## 2015-07-17 NOTE — Care Management Note (Signed)
Case Management Note  Patient Details  Name: DEAMONTE GESKE MRN: [id number] Date of Birth: Feb 08, 1961  Subjective/Objective:                    Action/Plan:   Expected Discharge Date:  07/06/15               Expected Discharge Plan:  [place]  In-House Referral:  NA  Discharge planning Services  CM Consult  Post Acute Care Choice:  Resumption of Svcs/PTA Provider Choice offered to:  Patient  DME Arranged:    DME Agency:  [place] Arranged:  RN, PT, Nurse's Aide [place] Agency:     Status of Service:  Completed, signed off  Medicare Important Message Given:  Yes Date Medicare IM Given:    Medicare IM give by:    Date Additional Medicare IM Given:    Additional Medicare Important Message give by:     If discussed at [place] of Stay Meetings, dates discussed:    Additional Comments: Pt discharged home today with [facility] RN and PT (per pts choice). Romualdo Bolk of [facility] is aware and will collect the pts information from the chart. [place] services to start within 48 hours of discharge. Pt has walker in room that was delivered to pt from [facility]. Pt and pts nurse aware of discharge arrangements. [name] [place], RN 07/17/2015, 11:12 AM

## 2015-07-17 NOTE — Consult Note (Signed)
   [facility] CM Inpatient Consult   07/17/2015  [name] Oct 24, 1960 [id number]  Spoke with patient and wife at bedside regarding [place] Nurse services. Patient and wife both verbalized interest in [place] Management services and agree to participate. Patient signed consent form and packet with [facility] information given to patient. Patient will receive post hospital follow up calls and be assessed for home visits.   Of note, [facility] Care Management services does not replace or interfere with any services that are arranged by inpatient case management or social work.   For questions, please contact: Royetta Crochet. [name], RN, BSN, [place] [facility] Liaison [phone]

## 2015-07-17 NOTE — Discharge Instructions (Signed)
Cirrhosis °Cirrhosis is long-term (chronic) liver injury. The liver is your largest internal organ, and it performs many functions. The liver converts food into energy, removes toxic material from your blood, makes important proteins, and absorbs necessary vitamins from your diet. °If you have cirrhosis, it means many of your healthy liver cells have been replaced by scar tissue. This prevents blood from flowing through your liver, which makes it difficult for your liver to function. This scarring is not reversible, but treatment can prevent it from getting worse.  °CAUSES  °Hepatitis C and long-term alcohol abuse are the most common causes of cirrhosis. Other causes include: °· Nonalcoholic fatty liver disease. °· Hepatitis B infection. °· Autoimmune hepatitis. °· Diseases that cause blockage of ducts inside the liver. °· Inherited liver diseases. °· Reactions to certain long-term medicines. °· Parasitic infections. °· Long-term exposure to certain toxins. °RISK FACTORS °You may have a higher risk of cirrhosis if you: °· Have certain hepatitis viruses. °· Abuse alcohol, especially if you are male. °· Are overweight. °· Share needles. °· Have unprotected sex with someone who has hepatitis. °SYMPTOMS  °You may not have any signs and symptoms at first. Symptoms may not develop until the damage to your liver starts to get worse. Signs and symptoms of cirrhosis may include:  °· Tenderness in the right-upper part of your abdomen. °· Weakness and tiredness (fatigue). °· Loss of appetite. °· Nausea. °· Weight loss and muscle loss. °· Itchiness. °· Yellow skin and eyes (jaundice). °· Buildup of fluid in the abdomen (ascites). °· Swelling of the feet and ankles (edema). °· Appearance of tiny blood vessels under the skin. °· Mental confusion. °· Easy bruising and bleeding. °DIAGNOSIS  °Your health care provider may suspect cirrhosis based on your symptoms and medical history, especially if you have other medical conditions  or a history of alcohol abuse. Your health care provider will do a physical exam to feel your liver and check for signs of cirrhosis. Your health care provider may perform other tests, including:  °· Blood tests to check:   °¨ Whether you have hepatitis B or C.   °¨ Kidney function. °¨ Liver function. °· Imaging tests such as: °¨ MRI or CT scan to look for changes seen in advanced cirrhosis. °¨ Ultrasound to see if normal liver tissue is being replaced by scar tissue. °· A procedure using a long needle to take a sample of liver tissue (biopsy) for examination under a microscope. Liver biopsy can confirm the diagnosis of cirrhosis.   °TREATMENT  °Treatment depends on how damaged your liver is and what caused the damage. Treatment may include treating cirrhosis symptoms or treating the underlying causes of the condition to try to slow the progression of the damage. Treatment may include: °· Making lifestyle changes, such as:   °¨ Eating a healthy diet. °¨ Restricting salt intake.  °¨ Maintaining a healthy weight.   °¨ Not abusing drugs or alcohol. °· Taking medicines to: °¨ Treat liver infections or other infections. °¨ Control itching. °¨ Reduce fluid buildup. °¨ Reduce certain blood toxins. °¨ Reduce risk of bleeding from enlarged blood vessels in the stomach or esophagus (varices). °· If varices are causing bleeding problems, you may need treatment with a procedure that ties up the vessels causing them to fall off (band ligation). °· If cirrhosis is causing your liver to fail, your health care provider may recommend a liver transplant. °· Other treatments may be recommended depending on any complications of cirrhosis, such as liver-related kidney failure (hepatorenal   syndrome). °HOME CARE INSTRUCTIONS  °· Take medicines only as directed by your health care provider. Do not use drugs that are toxic to your liver. Ask your health care provider before taking any new medicines, including over-the-counter medicines.    °· Rest as needed. °· Eat a well-balanced diet. Ask your health care provider or dietitian for more information.   °· You may have to follow a low-salt diet or restrict your water intake as directed. °· Do not drink alcohol. This is especially important if you are taking acetaminophen. °· Keep all follow-up visits as directed by your health care provider. This is important. °SEEK MEDICAL CARE IF: °· You have fatigue or weakness that is getting worse. °· You develop swelling of the hands, feet, legs, or face. °· You have a fever. °· You develop loss of appetite. °· You have nausea or vomiting. °· You develop jaundice. °· You develop easy bruising or bleeding. °SEEK IMMEDIATE MEDICAL CARE IF: °· You vomit bright red blood or a material that looks like coffee grounds. °· You have blood in your stools. °· Your stools appear black and tarry. °· You become confused. °· You have chest pain or trouble breathing. °  °This information is not intended to replace advice given to you by your health care provider. Make sure you discuss any questions you have with your health care provider. °  °Document Released: 07/28/2005 Document Revised: 08/18/2014 Document Reviewed: 04/05/2014 °Elsevier Interactive Patient Education ©2016 [employer] ° °

## 2015-07-17 NOTE — Progress Notes (Signed)
Patient stable and discharged with all belongings via wheelchair with his wife .

## 2015-07-17 NOTE — Care Management Important Message (Signed)
Important Message  Patient Details  Name: [name] MRN: [id number] Date of Birth: Jul 12, 1961   Medicare Important Message Given:  Yes    Joylene Draft, RN 07/17/2015, 11:11 AM

## 2015-07-17 NOTE — Discharge Summary (Signed)
[facility]  Physician Discharge Summary   Patient ID: [name] MRN: [id number] DOB/AGE: 53-Mar-1962 53 y.o.  Admit date: 07/02/2015 Discharge date: 07/17/2015  PCP: [name], MD  DISCHARGE DIAGNOSES:  Principal Problem:   Anasarca Active Problems:   Hepatic cirrhosis ([place])   Diabetes ([place])   Chronic hepatitis C with cirrhosis ([place])   Scrotal edema   Thrombocytopenia (HCC)   Morbid obesity (HCC)   Leg pain, lateral   Cellulitis of right lower extremity   Chronic liver disease and cirrhosis ([place])   Palliative care encounter   DNR (do not resuscitate) discussion   RECOMMENDATIONS FOR OUTPATIENT FOLLOW UP: 1. BASIC metabolic panel to be checked by home health on Friday   DISCHARGE CONDITION: fair  Diet recommendation: Low sodium, modified carbohydrate  Filed Weights   07/15/15 0445 07/16/15 0715 07/17/15 0555  Weight: 136 kg (299 lb 13.2 oz) 135.626 kg (299 lb) 131 kg (288 lb 12.8 oz)    INITIAL HISTORY: [id number] PMH alcoholic cirrhosis, hepatitis C, recent 15 day hospitalization for RLE cellulitis, presented with anasarca. Admitted for anasarca secondary to cirrhosis for IV diuresis. Also noted to have significant scrotal edema. All these are improving.  Consultants: Gastroenterology. Previously seen by urology. Palliative medicine  Procedures:  Echocardiogram Study Conclusions - Left ventricle: The cavity size was normal. Wall thickness wasincreased in a pattern of severe LVH. Systolic function wasnormal. The estimated ejection fraction was in the range of 60%to 65%. Wall motion was normal; there were no regional wallmotion abnormalities. Doppler parameters are consistent withabnormal left ventricular relaxation (grade 1 diastolicdysfunction). - Aortic valve: Mildly calcified annulus. Trileaflet; mildlythickened leaflets. Valve area (VTI): 2.54 cm^2. Valve area(Vmax): 2.48 cm^2. - Technically difficult study. Definity echocontrast was used  Licensed conveyancer.   HOSPITAL COURSE:   Anasarca, secondary to cirrhosis complicated by acute diastolic CHF Patient was admitted to the hospital. He was seen by gastroenterology. He was started on diuretics. He was also seen by palliative medicine. Patient had significant lower extremity edema as well as scrotal edema. Initially slow to respond. He was given albumin infusions. Subsequently, he started responding. He has been in negative balance for more than 28 L. He has lost significant amount of weight. During this hospitalization he did have acute urinary retention requiring Foley catheter placement. Foley catheter was removed yesterday and he has been able to urinate on his own. His scrotal edema is also significantly improved. He was transitioned to oral Lasix yesterday and will be discharged on the same. Basic metabolic panel to be checked by home health this Friday. He did undergo scrotal ultrasound in October which revealed varicocele and hydrocele. No torsion was noted. There is some discomfort associated with the scrotal edema. This is reasonably well controlled with his pain medications.  Decompensated alcoholic/hepatitis C cirrhosis/thrombocytopenia With associated varices, anasarca, thrombocytopenia, splenomegaly, portal venous hypertension. His long-term prognosis remains poor. Seen by palliative medicine. Patient remains full code. Patient and family have not decided about hospice yet. This will need to be pursued in the outpatient setting.  Acute diastolic CHF Continue diuretics as mentioned above.   Acute kidney injury  Creatinine is improving with diuresis. He could have underlying chronic kidney disease as well.    RLE wound Skin changes stable. Wound care nurse has seen the patient. Home health nurse to address wound issues.   DM type 2 Diabetes has been stable during this hospitalization. He may continue Lantus at home.   COPD Remains stable.  Anemia of chronic  disease Hemoglobin has trended down some but stable. This is likely secondary to fluid shifts. No evidence for overt bleeding.   Recent hospitalization for cellulitis RLE. Stable  CT scan shows developing adenopathy Etiology is unclear. Patient has poor prognosis due to his liver disease. He is likely not a candidate for aggressive workup for this adenopathy.  Overall much improved. Patient isn't better spirits. However, he does understand that he has advanced liver disease/no cure. His long-term prognosis remains poor. But currently is stable for discharge home.   PERTINENT LABS:  The results of significant diagnostics from this hospitalization (including imaging, microbiology, ancillary and laboratory) are listed below for reference.      Labs: Basic Metabolic Panel:  Recent Labs Lab 07/13/15 0613 07/14/15 0604 07/15/15 0555 07/16/15 0628 07/17/15 0548  NA 141 139 137 137 138  K 3.7 3.8 3.9 4.1 4.1  CL 102 104 101 104 106  CO2 29 29 29 27 27   GLUCOSE 139* 144* 158* 163* 155*  BUN 27* 25* 24* 24* 24*  CREATININE 1.42* 1.43* 1.42* 1.38* 1.29*  CALCIUM 8.9 8.7* 8.6* 8.6* 8.7*  MG 1.4* 1.6* 1.7 1.7 1.7   CBC:  Recent Labs Lab 07/12/15 0555 07/13/15 0613  WBC 2.8* 2.1*  HGB 9.1* 9.3*  HCT 26.6* 27.6*  MCV 95.3 95.8  PLT 57* 54*   BNP: BNP (last 3 results)  Recent Labs  06/11/15 1546  BNP 89.0    CBG:  Recent Labs Lab 07/16/15 1125 07/16/15 1640 07/16/15 2157 07/17/15 0738 07/17/15 1134  GLUCAP 184* 172* 152* 160* 200*     IMAGING STUDIES Ct Abdomen Pelvis Wo Contrast  06/23/2015  CLINICAL DATA:  Lower abdominal pain and swelling. Concern for constipation/ impaction. History of diabetes, hypertension, splenomegaly, COPD, GERD, hepatitis-C and cirrhosis. EXAM: CT ABDOMEN AND PELVIS WITHOUT CONTRAST TECHNIQUE: Multidetector CT imaging of the abdomen and pelvis was performed following the standard protocol without IV contrast. COMPARISON:   01/15/2015 FINDINGS: Lower chest: Emphysematous changes are identified within the lung bases. Small right pleural effusion noted. Gynecomastia bilaterally. Heart size is normal. No imaged pericardial effusion or significant coronary artery calcifications. Upper abdomen: There is a ascites within the abdomen, particularly within the perihepatic region. The liver is scalloped and small consistent with cirrhotic morphology. The gallbladder is present and contains dependent and calcified gallstones. The spleen is markedly enlarged. Varices are identified within the left upper quadrant. Intrarenal calculus is identified within the left lower pole, measuring approximately 6 mm in diameter. There is no hydronephrosis. The right kidney is normal in appearance. Gastrointestinal tract: The stomach and proximal small bowel loops are normal in appearance. Distal small bowel loops contain fecal material. Proximal colonic loops are markedly distended with stool. There is gradual tapering to level of the rectum which is normal in caliber. However there is no obvious stricture or mass as a point of obstruction. Pelvis: The urinary bladder is decompressed. The prostate gland and seminal vesicles are normal in appearance. Retroperitoneum: There are enlarged lymph nodes in the right internal iliac chain, external iliac chain and common femoral chain. Largest is identified in the right common femoral region common measuring 2.4 x 3.9 cm on image 89 of series 2. Smaller lymph nodes are identified in the left pelvic sidewall better nonspecific. There are small bilateral and nonspecific inguinal lymph nodes. Enlargement of the left gonadal vein like related to splenorenal shunt and portal venous hypertension. Abdominal wall: Diffuse anasarca. Osseous structures: Spondylosis in the thoracolumbar spine. No suspicious  lytic or blastic lesions are identified. IMPRESSION: 1. Cirrhosis and marked splenomegaly. Splenorenal shunt. Findings  consistent with portal venous hypertension. 2. Distended left gonadal vein, likely also related to portal venous hypertension and splenorenal shunt. 3. Significant amount of stool within the distal small bowel loops and proximal colon although there is no mass identified as a point of obstruction. Findings most consistent with colonic ileus. 4. Developing adenopathy in the pelvis. Right common femoral node is 2.4 x 3.9 cm. Malignancy should be considered. Correlation with PSA recommended. Nonspecific bilateral inguinal lymph nodes. 5. Cholelithiasis. 6. Nonobstructing left intrarenal calculus. Electronically Signed   By: Nolon Nations M.D.   On: 06/23/2015 18:23   Dg Chest 2 View  07/02/2015  CLINICAL DATA:  Short of breath EXAM: CHEST  2 VIEW COMPARISON:  06/11/2015 FINDINGS: Normal heart size. Semi recumbent AP view of the lungs with low volumes. Grossly clear. No pneumothorax. No pleural effusion. No obvious pneumothorax. IMPRESSION: Low volumes.  No active cardiopulmonary disease. Electronically Signed   By: Marybelle Killings M.D.   On: 07/02/2015 11:39   Dg Abd Portable 1v  06/24/2015  CLINICAL DATA:  Abdominal pain, constipation/D ileus. EXAM: PORTABLE ABDOMEN - 1 VIEW COMPARISON:  CT abdomen and pelvis 06/23/2015. Chest in two views abdomen 01/29/2014. FINDINGS: The patient's size somewhat limits the study. Gaseous distention of small bowel is identified. There is a large volume of gas and stool in the ascending colon. IMPRESSION: Bowel gas pattern compatible with ileus. Large volume of gas and stool ascending colon. Electronically Signed   By: Inge Rise M.D.   On: 06/24/2015 13:02    DISCHARGE EXAMINATION: Filed Vitals:   07/16/15 0715 07/16/15 2200 07/16/15 2340 07/17/15 0555  BP:  133/76  154/60  Pulse:  79  78  Temp:  98.2 F (36.8 C)  98.3 F (36.8 C)  TempSrc:  Oral  Oral  Resp:  20  20  Height:      Weight: 135.626 kg (299 lb)   131 kg (288 lb 12.8 oz)  SpO2:  96% 98% 100%     General appearance: alert, cooperative, appears stated age and no distress Cardio: regular rate and rhythm, S1, S2 normal, no murmur, click, rub or gallop GI: soft, non-tender; bowel sounds normal; no masses,  no organomegaly Extremities: Still has 1-2+ pitting edema in the lower extremities but much improved.  Scrotal edema is also much improved.   DISPOSITION: Home with home health  Discharge Instructions    AMB Referral to [facility]    Complete by:  As directed   Please refer to [facility] RN community for Transition of Care program. Consent obtained at bedside. Alternative contact is wife, [name] [phone]  Patient with 3 admits in 6 months. Hx of Diabetes and Cirrhosis  Please call with questions or concerns.  Royetta Crochet. Niemczura, RN, BSN, CCM  [facility] [phone]  Reason for consult:  [facility] active  Diagnoses of:  Diabetes  Expected date of contact:  1-3 days (reserved for hospital discharges)     Call MD for:  difficulty breathing, headache or visual disturbances    Complete by:  As directed      Call MD for:  extreme fatigue    Complete by:  As directed      Call MD for:  persistant dizziness or light-headedness    Complete by:  As directed      Call MD for:  persistant nausea and vomiting  Complete by:  As directed      Call MD for:  severe uncontrolled pain    Complete by:  As directed      Call MD for:  temperature >100.4    Complete by:  As directed      Diet - low sodium heart healthy    Complete by:  As directed      Diet Carb Modified    Complete by:  As directed      Discharge instructions    Complete by:  As directed   Please take your medications as prescribed. Avoid more than 1531ml of fluid intake per day.  You were cared for by a hospitalist during your hospital stay. If you have any questions about your discharge medications or the care you received while you were in the hospital after you are discharged, you can call the  unit and asked to speak with the hospitalist on call if the hospitalist that took care of you is not available. Once you are discharged, your primary care physician will handle any further medical issues. Please note that NO REFILLS for any discharge medications will be authorized once you are discharged, as it is imperative that you return to your primary care physician (or establish a relationship with a primary care physician if you do not have one) for your aftercare needs so that they can reassess your need for medications and monitor your lab values. If you do not have a primary care physician, you can call [phone] for a physician referral.     Increase activity slowly    Complete by:  As directed             Current Discharge Medication List    START taking these medications   Details  LORazepam (ATIVAN) 0.5 MG tablet Take 1 tablet (0.5 mg total) by mouth 2 (two) times daily as needed for anxiety. Qty: 20 tablet, Refills: 0    magnesium oxide (MAG-OX) 400 (241.3 MG) MG tablet Take 1 tablet (400 mg total) by mouth 2 (two) times daily. Qty: 60 tablet, Refills: 1      CONTINUE these medications which have CHANGED   Details  furosemide (LASIX) 40 MG tablet Take 2 tablets (80 mg total) by mouth 2 (two) times daily. Qty: 120 tablet, Refills: 2    lactulose (CHRONULAC) 10 GM/15ML solution Take 15 mLs (10 g total) by mouth daily. TAKE 15 MLS (10 G TOTAL) BY MOUTH 3 (THREE) TIMES DAILY. Qty: 473 mL, Refills: 1    oxyCODONE (OXY IR/ROXICODONE) 5 MG immediate release tablet Take 1 tablet (5 mg total) by mouth every 6 (six) hours as needed for moderate pain. Qty: 30 tablet, Refills: 0    spironolactone (ALDACTONE) 50 MG tablet Take 1 tablet (50 mg total) by mouth daily. Qty: 30 tablet, Refills: 3      CONTINUE these medications which have NOT CHANGED   Details  acetaminophen (TYLENOL) 500 MG tablet Take 500 mg by mouth every 6 (six) hours as needed for moderate pain.    albuterol  (PROVENTIL HFA;VENTOLIN HFA) 108 (90 BASE) MCG/ACT inhaler Inhale 2 puffs into the lungs every 6 (six) hours as needed. Shortness of breath Qty: 1 Inhaler, Refills: 1    cyclobenzaprine (FLEXERIL) 5 MG tablet Take 25 mg by mouth daily as needed for muscle spasms.     glipiZIDE (GLUCOTROL) 10 MG tablet TAKE 1 TABLET (10 MG TOTAL) BY MOUTH DAILY. Qty: 30 tablet, Refills: 1  hydrocerin (EUCERIN) CREA Apply 1 application topically 3 (three) times daily as needed (for itching/dry skin). Refills: 0    hydrOXYzine (ATARAX/VISTARIL) 10 MG tablet TAKE 1 TABLET (10 MG TOTAL) BY MOUTH EVERY 4 (FOUR) HOURS AS NEEDED FOR ITCHING. Qty: 30 tablet, Refills: 1    Insulin Glargine (LANTUS SOLOSTAR) 100 UNIT/ML Solostar Pen Inject 10 Units into the skin at bedtime.    rifaximin (XIFAXAN) 550 MG TABS tablet Take 1 tablet (550 mg total) by mouth 2 (two) times daily. Qty: 60 tablet, Refills: 5    omeprazole (PRILOSEC) 20 MG capsule Take 2 capsules (40 mg total) by mouth daily. Qty: 60 capsule, Refills: 2      STOP taking these medications     fluconazole (DIFLUCAN) 100 MG tablet        Follow-up Information    Follow up with [facility].   Contact information:   [address] [place] [address] [phone]       Follow up with [name], MD On 07/24/2015.   Specialty:  Family Medicine   Why:  at 9:00 am   Contact information:   [place] [address] [phone]       TOTAL DISCHARGE TIME: 35 minutes  [facility] Pager [phone]  07/17/2015, 1:08 PM

## 2015-07-18 ENCOUNTER — Telehealth: Payer: Self-pay | Admitting: *Deleted

## 2015-07-18 NOTE — Telephone Encounter (Signed)
07/18/15 4:12pm No answer on first attempt. Will try again later today or tomorrow morning.   Appt scheduled before discharge for 07/24/15 with Dr Sabra Heck. Patient already had an appt scheduled for 07/25/15 with Dr Sabra Heck for his regular check-up. Will need to cancel that one when I speak with him.    DISCHARGE INFORMATION Date of Discharge:07/17/15  Discharge Facility: [facility]     Principal Discharge Diagnosis: Cirrhosis   Patient and/or caregiver is knowledgeable of his/her condition(s) and treatment: Yes   MEDICATION RECONCILIATION Medication list reviewed with patient: Yes  Patient is able to obtain needed medications: Yes   ACTIVITIES OF DAILY LIVING  Is the patient able to perform his/her own ADLs: with some assistance. Has wife at home that helps and home health  Patient is receiving home health services: yes three times weekly   PATIENT EDUCATION Questions/Concerns Discussed: Needed refills on glucose test strips. They were ordered and sent in to the pharmacy. Wife reports that his abdominal swelling has decreased dramatically since being hospitalized. They will follow up sooner if necessary.

## 2015-07-19 ENCOUNTER — Telehealth: Payer: Self-pay | Admitting: *Deleted

## 2015-07-19 ENCOUNTER — Other Ambulatory Visit: Payer: Self-pay | Admitting: *Deleted

## 2015-07-19 ENCOUNTER — Telehealth: Payer: Self-pay

## 2015-07-19 MED ORDER — GLUCOSE BLOOD VI STRP: ORAL_STRIP | Status: DC

## 2015-07-19 NOTE — Telephone Encounter (Signed)
T/C from [facility] from [facility] [phone]).  She said pt is just home from the hospital and they are out to check on him. He does not have Xifaxan and she said his wife said that sometimes we provide samples, but she has to work and she is not sure when she could pick up any. I told Verdis Frederickson that I will leave samples for him at the front ( #18) for her to get someone to pick up. I told her per Almyra Free, they had paperwork to fill out for assistance with the Xifaxan but they never returned it to the office. I have left samples at the front previously for weeks and no one would pick up.  She would like to know if [name] would recommend having social service go out and do an assessment. Please advise!

## 2015-07-19 NOTE — Telephone Encounter (Signed)
Sometimes drinking some tonic water helps. This has quinine in it and can be effective for leg cramps

## 2015-07-19 NOTE — Telephone Encounter (Signed)
[name] is aware. 

## 2015-07-19 NOTE — Patient Outreach (Signed)
07/19/15- Pt hospitalized 07/02/15-07/17/15- telephone call for transition of care week 1, spoke with wife Aeneas Beecham who is primary caregiver and reports " He's doing really well since he got home"  Wife states she is purchashing a scale and pt will start weighing daily, reports pt has no edema at present, wife states " just got new glucometer and will start checking blood sugar this week"  RN CM schecduled initial home visit for next week 07/27/15.  RN CM faxed today's transition of care note to primary MD Dr. Sabra Heck.  Outpatient Encounter Prescriptions as of 07/19/2015  Medication Sig Note  . acetaminophen (TYLENOL) 500 MG tablet Take 500 mg by mouth every 6 (six) hours as needed for moderate pain.   Marland Kitchen albuterol (PROVENTIL HFA;VENTOLIN HFA) 108 (90 BASE) MCG/ACT inhaler Inhale 2 puffs into the lungs every 6 (six) hours as needed. Shortness of breath   . cyclobenzaprine (FLEXERIL) 5 MG tablet Take 25 mg by mouth daily as needed for muscle spasms.  06/11/2015: Spouse states that patienthas been taking 5 at a time for pain. Prescribed one TID  . furosemide (LASIX) 40 MG tablet Take 2 tablets (80 mg total) by mouth 2 (two) times daily.   Marland Kitchen glipiZIDE (GLUCOTROL) 10 MG tablet TAKE 1 TABLET (10 MG TOTAL) BY MOUTH DAILY.   Marland Kitchen glucose blood test strip Use twice daily to test blood sugar. Dx: E11.9   . hydrocerin (EUCERIN) CREA Apply 1 application topically 3 (three) times daily as needed (for itching/dry skin).   . hydrOXYzine (ATARAX/VISTARIL) 10 MG tablet TAKE 1 TABLET (10 MG TOTAL) BY MOUTH EVERY 4 (FOUR) HOURS AS NEEDED FOR ITCHING.   . Insulin Glargine (LANTUS SOLOSTAR) 100 UNIT/ML Solostar Pen Inject 10 Units into the skin at bedtime. 06/11/2015: Has not taken per spouse and states that she does not know when patient last took his injection  . lactulose (CHRONULAC) 10 GM/15ML solution Take 15 mLs (10 g total) by mouth daily. TAKE 15 MLS (10 G TOTAL) BY MOUTH 3 (THREE) TIMES DAILY.   Marland Kitchen LORazepam (ATIVAN)  0.5 MG tablet Take 1 tablet (0.5 mg total) by mouth 2 (two) times daily as needed for anxiety.   . magnesium oxide (MAG-OX) 400 (241.3 MG) MG tablet Take 1 tablet (400 mg total) by mouth 2 (two) times daily.   Marland Kitchen omeprazole (PRILOSEC) 20 MG capsule Take 2 capsules (40 mg total) by mouth daily.   Marland Kitchen oxyCODONE (OXY IR/ROXICODONE) 5 MG immediate release tablet Take 1 tablet (5 mg total) by mouth every 6 (six) hours as needed for moderate pain.   . rifaximin (XIFAXAN) 550 MG TABS tablet Take 1 tablet (550 mg total) by mouth 2 (two) times daily. 06/11/2015: HAS NOT TAKEN IN 4 DAYS-DUE TO PICK UP AT MD OFFICE-DOES NOT FILL IN PHARMACY  . spironolactone (ALDACTONE) 50 MG tablet Take 1 tablet (50 mg total) by mouth daily.    No facility-administered encounter medications on file as of 07/19/2015.    THN CM Care Plan Problem One        Most Recent Value   Care Plan Problem One  Pt high risk for hospitalization related to disease process   Role Documenting the Problem One  Care Management Coordinator   Care Plan for Problem One  Active   THN Long Term Goal (31-90 days)  pt will have no hospitalizations within 90 days.   THN Long Term Goal Start Date  07/19/15   Interventions for Problem One Long Term Goal  RN CM reviewed discharge instructions with wife, importance of following up with primary MD on 07/24/15 and taking all medications as prescribed.     PLAN Follow up with initial home visit on 07/27/15.  [name] [facility], [place] Coordinator [phone]

## 2015-07-19 NOTE — Telephone Encounter (Signed)
[facility] nurse said, patient was up all night long crying with leg cramps. He is due a BMP tomorrow and they will fax [place] results. Anything we can do for the cramps?

## 2015-07-19 NOTE — Telephone Encounter (Signed)
PLEASE CALL MARIA. SHE CAN ASK SOCIAL SERVICE TO GO OUT AND ASSESS THE PATIENT. SHE MAY USE MY NAME AS THE ORDERING MD.

## 2015-07-19 NOTE — Telephone Encounter (Signed)
[name] with orders

## 2015-07-20 ENCOUNTER — Other Ambulatory Visit: Payer: Self-pay | Admitting: *Deleted

## 2015-07-20 ENCOUNTER — Other Ambulatory Visit: Payer: Medicare Other

## 2015-07-20 DIAGNOSIS — R799 Abnormal finding of blood chemistry, unspecified: Secondary | ICD-10-CM

## 2015-07-20 NOTE — Progress Notes (Signed)
LAB ONLY 

## 2015-07-23 LAB — BMP8+EGFR

## 2015-07-24 ENCOUNTER — Ambulatory Visit (INDEPENDENT_AMBULATORY_CARE_PROVIDER_SITE_OTHER): Payer: Medicare Other | Admitting: Family Medicine

## 2015-07-24 ENCOUNTER — Encounter: Payer: Self-pay | Admitting: Family Medicine

## 2015-07-24 VITALS — BP 151/85 | HR 88 | Temp 98.5°F | Ht 65.0 in | Wt 288.2 lb

## 2015-07-24 DIAGNOSIS — K7030 Alcoholic cirrhosis of liver without ascites: Secondary | ICD-10-CM | POA: Diagnosis not present

## 2015-07-24 DIAGNOSIS — K7290 Hepatic failure, unspecified without coma: Secondary | ICD-10-CM | POA: Diagnosis not present

## 2015-07-24 DIAGNOSIS — E119 Type 2 diabetes mellitus without complications: Secondary | ICD-10-CM | POA: Diagnosis not present

## 2015-07-24 DIAGNOSIS — L03115 Cellulitis of right lower limb: Secondary | ICD-10-CM

## 2015-07-24 DIAGNOSIS — Z794 Long term (current) use of insulin: Secondary | ICD-10-CM | POA: Diagnosis not present

## 2015-07-24 DIAGNOSIS — R188 Other ascites: Secondary | ICD-10-CM | POA: Diagnosis not present

## 2015-07-24 DIAGNOSIS — K7682 Hepatic encephalopathy: Secondary | ICD-10-CM

## 2015-07-24 MED ORDER — OXYCODONE HCL 5 MG PO TABS: 5.0000 mg | ORAL_TABLET | Freq: Four times a day (QID) | ORAL | Status: DC | PRN

## 2015-07-24 MED ORDER — CITALOPRAM HYDROBROMIDE 20 MG PO TABS: 20.0000 mg | ORAL_TABLET | Freq: Every day | ORAL | Status: DC

## 2015-07-24 NOTE — Progress Notes (Signed)
Telephone Contact for Transitional Care Management was made on 07/19/15.

## 2015-07-24 NOTE — Progress Notes (Signed)
   Subjective:    Patient ID: [name], male    DOB: [birth year], 54 y.o.   MRN: [id number]  HPI  54 year old gentleman with cirrhosis of the liver and chronic liver disease who has been hospitalized twice in the last 1 or 2 months. Hospitalizations were prolonged. He was diuresed and lost about 66 pounds. He was discharged 12 6 with admission date 1121 area discharge diagnosis included chronic liver disease, congestive heart failure, diabetes, COPD, right lower leg wound. Since he has been home he has called the office with complaints of nocturnal leg cramps. It was suggested he take tonic water with quinine and that seems to have helped.  He does seem very depressed today. There is crying at the time of visit. He was seen by palliative care while in the hospital and was told he is dying but he knows this from before. I'm not sure given his extreme diuresis in looking and feeling better physically he seems more depressed now. He does not have appetite. Would suspect that the combination of his disease as well as some depression.    Review of Systems  Constitutional: Positive for activity change and fatigue.  HENT: Negative.   Respiratory: Negative.   Cardiovascular: Positive for leg swelling.  Genitourinary: Negative.   Neurological: Positive for weakness.  Psychiatric/Behavioral: Positive for sleep disturbance and decreased concentration. The patient is nervous/anxious.       BP 151/85 mmHg  Pulse 88  Temp(Src) 98.5 F (36.9 C) (Oral)  Ht 5\' 5"  (1.651 m)  Wt 288 lb 4 oz (130.749 kg)  BMI 47.97 kg/m2   Objective:   Physical Exam  Constitutional: He is oriented to person, place, and time. He appears well-developed and well-nourished.  HENT:  Head: Normocephalic.  Cardiovascular: Normal rate, regular rhythm and normal heart sounds.   Pulmonary/Chest: Effort normal and breath sounds normal.  Neurological: He is alert and oriented to person, place, and time.  Psychiatric:  Noted  in history of present illness, patient appears depressed and sad          Assessment & Plan:  . 1. Alcoholic cirrhosis of liver without ascites ([place]) Recent hospitalization for anasarca and extreme edema related to the cirrhosis. He was diuresed 66 pounds and generally feels better. He continues on lactulose and rifaximin  2. Encephalopathy, hepatic ([place]) Mental status is okay except for depression. There is no increased confusion he continues with lactulose. I did start Celexa 20 mg we'll reassess in one month as far as efficacy  3. Type 2 diabetes mellitus without complication, with long-term current use of insulin ([facility]) Diabetes is actually well managed on Lantus and glipizide  4. Ascites Rhesus was effective. Hopefully with combination of Lasix and spironolactone it will not recur  5. Cellulitis of right lower extremity  This was related to edema. Since that has been corrected there is no evidence of cellulitis or stasis dermatitis

## 2015-07-24 NOTE — Addendum Note (Signed)
Addended by: Ilean China on: 07/24/2015 02:25 PM   Modules accepted: Level of Service

## 2015-07-25 ENCOUNTER — Other Ambulatory Visit: Payer: Self-pay | Admitting: *Deleted

## 2015-07-25 ENCOUNTER — Ambulatory Visit: Payer: Medicare Other | Admitting: Family Medicine

## 2015-07-25 ENCOUNTER — Telehealth: Payer: Self-pay | Admitting: Family Medicine

## 2015-07-25 MED ORDER — OXYCODONE HCL 5 MG PO TABS: 10.0000 mg | ORAL_TABLET | Freq: Four times a day (QID) | ORAL | Status: DC | PRN

## 2015-07-25 MED ORDER — OXYCODONE-ACETAMINOPHEN 10-325 MG PO TABS: 1.0000 | ORAL_TABLET | Freq: Three times a day (TID) | ORAL | Status: DC | PRN

## 2015-07-25 NOTE — Telephone Encounter (Signed)
Patient was given oxycodone 5 mg and is taking to greater than 1. He had been taking 10 mg 4 times a day so by giving him 10 mg #60 and should last for 2 weeks

## 2015-07-26 NOTE — Telephone Encounter (Signed)
Pt aware new rx written and ready for pickup.

## 2015-07-27 ENCOUNTER — Encounter: Payer: Self-pay | Admitting: *Deleted

## 2015-07-27 ENCOUNTER — Telehealth: Payer: Self-pay | Admitting: Family Medicine

## 2015-07-27 ENCOUNTER — Other Ambulatory Visit: Payer: Self-pay | Admitting: *Deleted

## 2015-07-27 VITALS — BP 158/84 | HR 84 | Resp 16 | Wt 286.0 lb

## 2015-07-27 DIAGNOSIS — K7030 Alcoholic cirrhosis of liver without ascites: Secondary | ICD-10-CM

## 2015-07-27 MED ORDER — OXYCODONE HCL 5 MG PO TABS: 10.0000 mg | ORAL_TABLET | Freq: Four times a day (QID) | ORAL | Status: DC | PRN

## 2015-07-27 MED ORDER — OXYCODONE HCL 10 MG PO TABS: 10.0000 mg | ORAL_TABLET | Freq: Four times a day (QID) | ORAL | Status: DC | PRN

## 2015-07-27 NOTE — Telephone Encounter (Signed)
Dr Laurance Flatten please address the dose and MG  i will write for enough for 4 days until Sabra Heck is back

## 2015-07-27 NOTE — Telephone Encounter (Signed)
rx fixed and signed by Dr.Moore, pt's wife is bringing back the rx she just got.

## 2015-07-27 NOTE — Telephone Encounter (Signed)
Skin patient enough for 4 days until Dr. Sabra Heck returns of the pain medicine without Tylenol

## 2015-07-27 NOTE — Patient Outreach (Signed)
[name]   07/27/2015  [name] 22-Oct-1960 [id number]  [name] is an 54 y.o. male  Subjective: Initial home visit with pt, HIPAA verified, home health physical therapist present ([place]). Pt states " I just want to be able to drive again"  " I wish somebody could do something about these cramps I'm having, the tonic water is not working"  Pt states "no problems with my blood sugar, it's been good".  Objective:  Filed Vitals:   07/27/15 1236  BP: 158/84  Pulse: 84  Resp: 16  Weight: 286 lb (129.729 kg)  SpO2: 99%  CBG 110-140 range fasting, has occasional reading in 200's range at times.  ROS  Physical Exam  Constitutional: He is oriented to person, place, and time. He appears well-developed.  HENT:  Head: Normocephalic.  Neck: Normal range of motion. Neck supple.  Cardiovascular: Normal rate and regular rhythm.   Respiratory: Effort normal and breath sounds normal.  GI: Soft. Bowel sounds are normal.  Musculoskeletal: Normal range of motion. He exhibits edema.  1+ edema right lower extremity  Neurological: He is alert and oriented to person, place, and time.  Skin: Skin is warm and dry.  Psychiatric: He has a normal mood and affect. His behavior is normal. Judgment and thought content normal.    Current Medications:   Current Outpatient Prescriptions  Medication Sig Dispense Refill  . albuterol (PROVENTIL HFA;VENTOLIN HFA) 108 (90 BASE) MCG/ACT inhaler Inhale 2 puffs into the lungs every 6 (six) hours as needed. Shortness of breath 1 Inhaler 1  . citalopram (CELEXA) 20 MG tablet Take 1 tablet (20 mg total) by mouth daily. 30 tablet 3  . cyclobenzaprine (FLEXERIL) 5 MG tablet Take 25 mg by mouth daily as needed for muscle spasms.     . furosemide (LASIX) 40 MG tablet Take 2 tablets (80 mg total) by mouth 2 (two) times daily. 120 tablet 2  . glipiZIDE (GLUCOTROL) 10 MG tablet TAKE 1 TABLET (10 MG TOTAL) BY MOUTH  DAILY. 30 tablet 1  . glucose blood test strip Use twice daily to test blood sugar. Dx: E11.9 100 each 12  . hydrocerin (EUCERIN) CREA Apply 1 application topically 3 (three) times daily as needed (for itching/dry skin).  0  . hydrOXYzine (ATARAX/VISTARIL) 10 MG tablet TAKE 1 TABLET (10 MG TOTAL) BY MOUTH EVERY 4 (FOUR) HOURS AS NEEDED FOR ITCHING. 30 tablet 1  . Insulin Glargine (LANTUS SOLOSTAR) 100 UNIT/ML Solostar Pen Inject 10 Units into the skin at bedtime.    Marland Kitchen lactulose (CHRONULAC) 10 GM/15ML solution Take 15 mLs (10 g total) by mouth daily. TAKE 15 MLS (10 G TOTAL) BY MOUTH 3 (THREE) TIMES DAILY. 473 mL 1  . LORazepam (ATIVAN) 0.5 MG tablet Take 1 tablet (0.5 mg total) by mouth 2 (two) times daily as needed for anxiety. 20 tablet 0  . magnesium oxide (MAG-OX) 400 (241.3 MG) MG tablet Take 1 tablet (400 mg total) by mouth 2 (two) times daily. 60 tablet 1  . omeprazole (PRILOSEC) 20 MG capsule Take 2 capsules (40 mg total) by mouth daily. 60 capsule 2  . oxyCODONE (OXY IR/ROXICODONE) 5 MG immediate release tablet Take 2 tablets (10 mg total) by mouth every 6 (six) hours as needed for moderate pain. 60 tablet 0  . rifaximin (XIFAXAN) 550 MG TABS tablet Take 1 tablet (550 mg total) by mouth 2 (two) times daily. 60 tablet 5  . spironolactone (ALDACTONE) 50 MG  tablet Take 1 tablet (50 mg total) by mouth daily. 30 tablet 3  . acetaminophen (TYLENOL) 500 MG tablet Take 500 mg by mouth every 6 (six) hours as needed for moderate pain. Reported on 07/27/2015    . oxyCODONE-acetaminophen (PERCOCET) 10-325 MG tablet Take 1 tablet by mouth every 8 (eight) hours as needed for pain. (Patient not taking: Reported on 07/27/2015) 60 tablet 0   No current facility-administered medications for this visit.    Functional Status:   In your present state of health, do you have any difficulty performing the following activities: 07/27/2015 07/02/2015  Hearing? N N  Vision? N N  Difficulty concentrating or  making decisions? N N  Walking or climbing stairs? Y Y  Dressing or bathing? N N  Doing errands, shopping? Y N  Preparing Food and eating ? N -  Using the Toilet? N -  In the past six months, have you accidently leaked urine? N -  Do you have problems with loss of bowel control? N -  Managing your Medications? Y -  Managing your Finances? N -  Housekeeping or managing your Housekeeping? Y -    Fall/Depression Screening:    PHQ 2/9 Scores 07/27/2015 07/24/2015 04/24/2015 01/26/2015 06/27/2014  PHQ - 2 Score 6 6 0 0 0  PHQ- 9 Score 24 24 - - -   Fall Risk  07/27/2015 07/24/2015 04/24/2015 01/26/2015 06/27/2014  Falls in the past year? Yes No No No No  Number falls in past yr: 2 or more - - - -  Injury with Fall? No - - - -  Risk Factor Category  High Fall Risk - - - -  Risk for fall due to : History of fall(s);Medication side effect - - - -  Follow up Falls evaluation completed;Education provided;Falls prevention discussed - - - -   Assessment:  Pt is not on transplant list and not sure if he will be placed on the list.  There is an agency that is assisting pt with new railing for his front steps.  Pt is weighing, RN CM instructed pt to write down weight and CBG in [facility] calendar. RN CM reviewed all EMMI handouts with pt, gave pt medication box for better organization, RN CM gave pt "Basic Carb Counting" poster and reviewed with pt- although pt does not feel like he needs help with diabetes, RN CM provided overview.  Pt not interested at present in advanced directives, he will speak to his wife about this matter and if changes his mind, will let RN CM know.  RN CM faxed today's initial home visit and barrier letter to Dr. Sabra Heck.  THN CM Care Plan Problem One        Most Recent Value   Care Plan Problem One  Pt high risk for hospitalization related to disease process   Role Documenting the Problem One  Care Castillo Coordinator   Care Plan for Problem One  Active   THN Long Term Goal  (31-90 days)  pt will have no hospitalizations within 90 days.   THN Long Term Goal Start Date  07/19/15   Interventions for Problem One Long Term Goal  RN CM reviewed all medications with pt, reviewed importance of taking lactulose as prescribed as well as all other medications, sent referral to [place] for potential issues with obtaining xifaxan, polypharmacy.   THN CM Short Term Goal #1 (0-30 days)  pt will report increase endurance within 30 days.   THN CM  Short Term Goal #1 Start Date  07/27/15   Interventions for Short Term Goal #1  RN CM reviewed importance of working with home health physical therapist and completing prescribed exercises daily.   THN CM Short Term Goal #2 (0-30 days)  pt will verbalize improvement with issue of cramping within 30 days.   THN CM Short Term Goal #2 Start Date  07/27/15   Interventions for Short Term Goal #2  RN CM faxed letter to primary MD Dr. Sabra Heck and reported pt still having cramping, also reported to [facility] pharmacist for suggestions.      Plan: Continue weekly transition of care calls  Follow up with home visit 08/29/15 Collaborate with [facility] pharmacist/ home health as needed  [name] [facility], [place] Coordinator [phone]

## 2015-07-27 NOTE — Telephone Encounter (Signed)
Qty #16 given  Pt aware to be in touch with Dr Sabra Heck on Wednesday 08/01/15.

## 2015-07-31 ENCOUNTER — Other Ambulatory Visit: Payer: Self-pay | Admitting: Gastroenterology

## 2015-08-01 ENCOUNTER — Other Ambulatory Visit: Payer: Self-pay | Admitting: Family Medicine

## 2015-08-01 ENCOUNTER — Other Ambulatory Visit: Payer: Self-pay | Admitting: *Deleted

## 2015-08-01 MED ORDER — OXYCODONE HCL 10 MG PO TABS: 10.0000 mg | ORAL_TABLET | Freq: Four times a day (QID) | ORAL | Status: DC | PRN

## 2015-08-01 MED ORDER — CYCLOBENZAPRINE HCL 5 MG PO TABS: 5.0000 mg | ORAL_TABLET | Freq: Every day | ORAL | Status: DC

## 2015-08-01 MED ORDER — CYCLOBENZAPRINE HCL 5 MG PO TABS: 25.0000 mg | ORAL_TABLET | Freq: Every day | ORAL | Status: DC

## 2015-08-02 ENCOUNTER — Other Ambulatory Visit: Payer: Self-pay | Admitting: *Deleted

## 2015-08-02 NOTE — Patient Outreach (Signed)
08/02/15- Telephone call to patient for transition of care week 3, HIPAA verified, spoke wife [name] who reports "he's doing so much better"  CBG still in 100's range most of time, weight 287 pounds, has all medications and reports taking as prescribed, still having issue with cramps, has decided not to use anymore tonic water as it is not helping, MD is aware of cramping, pt and RN CM have reported.  RN CM reviewed action plan.  THN CM Care Plan Problem One        Most Recent Value   Care Plan Problem One  Pt high risk for hospitalization related to disease process   Role Documenting the Problem One  Care Management Coordinator   Care Plan for Problem One  Active   THN Long Term Goal (31-90 days)  pt will have no hospitalizations within 90 days.   THN Long Term Goal Start Date  07/19/15   Interventions for Problem One Long Term Goal  RN CM reminded pt, wife of taking medications as prescribed.   THN CM Short Term Goal #1 (0-30 days)  pt will report increase endurance within 30 days.   THN CM Short Term Goal #1 Start Date  07/27/15   THN CM Short Term Goal #2 (0-30 days)  pt will verbalize improvement with issue of cramping within 30 days.   THN CM Short Term Goal #2 Start Date  07/27/15      PLAN Continue weekly transition of care calls.  [name] [facility], [place] Coordinator [phone]

## 2015-08-03 ENCOUNTER — Other Ambulatory Visit: Payer: Self-pay

## 2015-08-03 MED ORDER — LORAZEPAM 0.5 MG PO TABS: 0.5000 mg | ORAL_TABLET | Freq: Two times a day (BID) | ORAL | Status: DC | PRN

## 2015-08-03 NOTE — Telephone Encounter (Signed)
rx called into pharmacy

## 2015-08-03 NOTE — Telephone Encounter (Signed)
Last seen 07/24/15 Dr Sabra Heck  If approved route to nurse to call into [facility]

## 2015-08-08 ENCOUNTER — Telehealth: Payer: Self-pay | Admitting: *Deleted

## 2015-08-08 NOTE — Telephone Encounter (Signed)
Would recommend extra dose of furosemide, 80 mg

## 2015-08-08 NOTE — Telephone Encounter (Signed)
Patients weight on 07/30/15 was 288, it is 295 today

## 2015-08-09 ENCOUNTER — Other Ambulatory Visit: Payer: Self-pay | Admitting: *Deleted

## 2015-08-09 ENCOUNTER — Telehealth: Payer: Self-pay | Admitting: Family Medicine

## 2015-08-09 NOTE — Patient Outreach (Signed)
08/09/15- Telephone call to patient's wife [name] for transition of care week 4, pt prefers RN CM speak wife wife, HIPAA verified, wife reports home health continues to see pt weekly, pt has all medications and taking as prescribed, reports "blood sugar good, no change", weight is 295 pounds due to "eating a lot over the holidays", reports edema is not an issue, wife states "main problem is still cramping, doctor is aware but I'm getting ready to call them now to see if there is anything else they can do". RN CM emphasized low salt diet, action plan for edema, calling early for change in health condition.  THN CM Care Plan Problem One        Most Recent Value   Care Plan Problem One  Pt high risk for hospitalization related to disease process   Role Documenting the Problem One  Care Management Coordinator   Care Plan for Problem One  Active   THN Long Term Goal (31-90 days)  pt will have no hospitalizations within 90 days.   THN Long Term Goal Start Date  07/19/15   Interventions for Problem One Long Term Goal  RN CM reinforced with pt, wife of taking medications as prescribed.   THN CM Short Term Goal #1 (0-30 days)  pt will report increase endurance within 30 days.   THN CM Short Term Goal #1 Start Date  07/27/15   THN CM Short Term Goal #2 (0-30 days)  pt will verbalize improvement with issue of cramping within 30 days.   THN CM Short Term Goal #2 Start Date  07/27/15   Interventions for Short Term Goal #2  RN CM encouraged pt to discuss issue of cramping with MD, wife is calling MD today.      PLAN See pt for home visit 08/29/15  [name] [facility], [place] Coordinator [phone]

## 2015-08-09 NOTE — Telephone Encounter (Signed)
NTBS- can drink club soda prior to bedtime.

## 2015-08-09 NOTE — Telephone Encounter (Signed)
Patient states that the back of his legs are cramping really bad and wants to know what he can take to help. He has been using a heating pad, a warm wash cloth. Please advise

## 2015-08-09 NOTE — Telephone Encounter (Signed)
Home Health nurse r/c. Gave Dr. Ammie Ferrier recommendations of an extra dose of 80 mg furosemide for today. Pt is already taking 80 mg BID.

## 2015-08-10 ENCOUNTER — Ambulatory Visit: Payer: Medicare Other | Admitting: Pediatrics

## 2015-08-11 ENCOUNTER — Ambulatory Visit (INDEPENDENT_AMBULATORY_CARE_PROVIDER_SITE_OTHER): Payer: Medicare Other | Admitting: Family Medicine

## 2015-08-11 DIAGNOSIS — L03314 Cellulitis of groin: Secondary | ICD-10-CM

## 2015-08-11 DIAGNOSIS — K7030 Alcoholic cirrhosis of liver without ascites: Secondary | ICD-10-CM | POA: Diagnosis not present

## 2015-08-11 DIAGNOSIS — R238 Other skin changes: Secondary | ICD-10-CM

## 2015-08-11 DIAGNOSIS — L03115 Cellulitis of right lower limb: Secondary | ICD-10-CM | POA: Diagnosis not present

## 2015-08-13 ENCOUNTER — Other Ambulatory Visit: Payer: Self-pay

## 2015-08-13 NOTE — Patient Outreach (Signed)
I called Mr. [name] to discussed his medications but had to leave a HIPPA compliant message for him to call me back.  I will attempt to call him back at a later back if I do not hear back from him.    Deanne Coffer, PharmD, [place] [phone]

## 2015-08-13 NOTE — Patient Outreach (Signed)
I received a return phone call from Karlyn Agee, Teodor Porchia's wife.  She has consent to speak to me.  I stated who I was and that I wanted to attend the home visit with [name] on 1/18.  She stated that was fine.  I also stated I wanted to send paper work for [employer] that will assist with possible getting it free.  She stated that was fine as well.  I walked her through what she needed to do to complete the paper work.  She stated she understood.  I will see Mr. Encarnacion on 08/29/15 with [name], RN.    Deanne Coffer, PharmD, [place] [phone]

## 2015-08-14 ENCOUNTER — Ambulatory Visit: Payer: Medicare Other | Admitting: Urology

## 2015-08-16 ENCOUNTER — Telehealth: Payer: Self-pay | Admitting: *Deleted

## 2015-08-16 NOTE — Telephone Encounter (Addendum)
Wt gain of 17lbs in 2 weeks, abdomen very distended, swelling in legs and ankles and  increase in SOB.  I spoke with Dr. Sabra Heck and he suggest patient double up on lasix tonight and have patient seen tomorrow. I spoke with [facility] @ [place] and advised of what Dr. Sabra Heck suggested and patient also advised that if the sob increases that he needs to go straight to the Emergency Room. Patient was given an appointment for 1/6 @ 11:15am with Dr. Sabra Heck.

## 2015-08-17 ENCOUNTER — Ambulatory Visit: Payer: Medicare Other | Admitting: Family Medicine

## 2015-08-20 ENCOUNTER — Telehealth: Payer: Self-pay | Admitting: Gastroenterology

## 2015-08-20 NOTE — Telephone Encounter (Signed)
Patient has appt Thursday and will need more samples of his medication

## 2015-08-22 ENCOUNTER — Emergency Department (HOSPITAL_COMMUNITY): Payer: Medicare Other

## 2015-08-22 ENCOUNTER — Inpatient Hospital Stay (HOSPITAL_COMMUNITY)
Admission: EM | Admit: 2015-08-22 | Discharge: 2015-08-29 | DRG: 433 | Disposition: A | Discharge status: Home Health | Payer: Medicare Other | Attending: Internal Medicine | Admitting: Internal Medicine

## 2015-08-22 ENCOUNTER — Encounter (HOSPITAL_COMMUNITY): Payer: Self-pay

## 2015-08-22 DIAGNOSIS — J45909 Unspecified asthma, uncomplicated: Secondary | ICD-10-CM | POA: Diagnosis present

## 2015-08-22 DIAGNOSIS — E871 Hypo-osmolality and hyponatremia: Secondary | ICD-10-CM | POA: Diagnosis not present

## 2015-08-22 DIAGNOSIS — E8770 Fluid overload, unspecified: Secondary | ICD-10-CM | POA: Diagnosis present

## 2015-08-22 DIAGNOSIS — E1142 Type 2 diabetes mellitus with diabetic polyneuropathy: Secondary | ICD-10-CM | POA: Diagnosis present

## 2015-08-22 DIAGNOSIS — B182 Chronic viral hepatitis C: Secondary | ICD-10-CM | POA: Diagnosis present

## 2015-08-22 DIAGNOSIS — K7031 Alcoholic cirrhosis of liver with ascites: Principal | ICD-10-CM | POA: Diagnosis present

## 2015-08-22 DIAGNOSIS — Z23 Encounter for immunization: Secondary | ICD-10-CM

## 2015-08-22 DIAGNOSIS — K769 Liver disease, unspecified: Secondary | ICD-10-CM | POA: Diagnosis not present

## 2015-08-22 DIAGNOSIS — K219 Gastro-esophageal reflux disease without esophagitis: Secondary | ICD-10-CM | POA: Diagnosis not present

## 2015-08-22 DIAGNOSIS — K7290 Hepatic failure, unspecified without coma: Secondary | ICD-10-CM | POA: Diagnosis not present

## 2015-08-22 DIAGNOSIS — R188 Other ascites: Secondary | ICD-10-CM | POA: Diagnosis present

## 2015-08-22 DIAGNOSIS — J449 Chronic obstructive pulmonary disease, unspecified: Secondary | ICD-10-CM | POA: Diagnosis not present

## 2015-08-22 DIAGNOSIS — Z6841 Body Mass Index (BMI) 40.0 and over, adult: Secondary | ICD-10-CM

## 2015-08-22 DIAGNOSIS — K7682 Hepatic encephalopathy: Secondary | ICD-10-CM | POA: Diagnosis present

## 2015-08-22 DIAGNOSIS — K7460 Unspecified cirrhosis of liver: Secondary | ICD-10-CM | POA: Diagnosis not present

## 2015-08-22 DIAGNOSIS — R601 Generalized edema: Secondary | ICD-10-CM | POA: Diagnosis present

## 2015-08-22 DIAGNOSIS — N179 Acute kidney failure, unspecified: Secondary | ICD-10-CM | POA: Diagnosis not present

## 2015-08-22 DIAGNOSIS — D6959 Other secondary thrombocytopenia: Secondary | ICD-10-CM | POA: Diagnosis present

## 2015-08-22 DIAGNOSIS — I129 Hypertensive chronic kidney disease with stage 1 through stage 4 chronic kidney disease, or unspecified chronic kidney disease: Secondary | ICD-10-CM | POA: Diagnosis present

## 2015-08-22 DIAGNOSIS — E119 Type 2 diabetes mellitus without complications: Secondary | ICD-10-CM | POA: Diagnosis not present

## 2015-08-22 DIAGNOSIS — Z794 Long term (current) use of insulin: Secondary | ICD-10-CM

## 2015-08-22 DIAGNOSIS — E875 Hyperkalemia: Secondary | ICD-10-CM | POA: Diagnosis not present

## 2015-08-22 DIAGNOSIS — D696 Thrombocytopenia, unspecified: Secondary | ICD-10-CM | POA: Diagnosis present

## 2015-08-22 DIAGNOSIS — R1084 Generalized abdominal pain: Secondary | ICD-10-CM | POA: Diagnosis present

## 2015-08-22 DIAGNOSIS — N289 Disorder of kidney and ureter, unspecified: Secondary | ICD-10-CM

## 2015-08-22 DIAGNOSIS — T502X5A Adverse effect of carbonic-anhydrase inhibitors, benzothiadiazides and other diuretics, initial encounter: Secondary | ICD-10-CM | POA: Diagnosis present

## 2015-08-22 DIAGNOSIS — E11649 Type 2 diabetes mellitus with hypoglycemia without coma: Secondary | ICD-10-CM | POA: Diagnosis not present

## 2015-08-22 DIAGNOSIS — T500X5A Adverse effect of mineralocorticoids and their antagonists, initial encounter: Secondary | ICD-10-CM | POA: Diagnosis not present

## 2015-08-22 DIAGNOSIS — E1122 Type 2 diabetes mellitus with diabetic chronic kidney disease: Secondary | ICD-10-CM | POA: Diagnosis present

## 2015-08-22 DIAGNOSIS — R17 Unspecified jaundice: Secondary | ICD-10-CM

## 2015-08-22 DIAGNOSIS — N183 Chronic kidney disease, stage 3 (moderate): Secondary | ICD-10-CM | POA: Diagnosis not present

## 2015-08-22 HISTORY — DX: Disorder of kidney and ureter, unspecified: N28.9

## 2015-08-22 LAB — ALBUMIN, FLUID (OTHER)

## 2015-08-22 LAB — CBC WITH DIFFERENTIAL/PLATELET
Basophils Absolute: 0 10*3/uL (ref 0.0–0.1)
Basophils Relative: 0 %
Eosinophils Absolute: 0.1 10*3/uL (ref 0.0–0.7)
Eosinophils Relative: 2 %
HEMATOCRIT: 30.5 % — AB (ref 39.0–52.0)
HEMOGLOBIN: 10.2 g/dL — AB (ref 13.0–17.0)
LYMPHS ABS: 1.4 10*3/uL (ref 0.7–4.0)
LYMPHS PCT: 24 %
MCH: 32.4 pg (ref 26.0–34.0)
MCHC: 33.4 g/dL (ref 30.0–36.0)
MCV: 96.8 fL (ref 78.0–100.0)
MONOS PCT: 16 %
Monocytes Absolute: 1 10*3/uL (ref 0.1–1.0)
NEUTROS ABS: 3.4 10*3/uL (ref 1.7–7.7)
NEUTROS PCT: 57 %
Platelets: 61 10*3/uL — ABNORMAL LOW (ref 150–400)
RBC: 3.15 MIL/uL — AB (ref 4.22–5.81)
RDW: 16.7 % — ABNORMAL HIGH (ref 11.5–15.5)
WBC: 5.9 10*3/uL (ref 4.0–10.5)

## 2015-08-22 LAB — COMPREHENSIVE METABOLIC PANEL
ALT: 51 U/L (ref 17–63)
AST: 117 U/L — ABNORMAL HIGH (ref 15–41)
Albumin: 2.1 g/dL — ABNORMAL LOW (ref 3.5–5.0)
Alkaline Phosphatase: 120 U/L (ref 38–126)
Anion gap: 5 (ref 5–15)
BUN: 49 mg/dL — ABNORMAL HIGH (ref 6–20)
CHLORIDE: 107 mmol/L (ref 101–111)
CO2: 25 mmol/L (ref 22–32)
Calcium: 7.9 mg/dL — ABNORMAL LOW (ref 8.9–10.3)
Creatinine, Ser: 1.85 mg/dL — ABNORMAL HIGH (ref 0.61–1.24)
GFR, EST AFRICAN AMERICAN: 46 mL/min — AB (ref >60)
GFR, EST NON AFRICAN AMERICAN: 40 mL/min — AB (ref >60)
Glucose, Bld: 222 mg/dL — ABNORMAL HIGH (ref 65–99)
POTASSIUM: 5.4 mmol/L — AB (ref 3.5–5.1)
SODIUM: 137 mmol/L (ref 135–145)
Total Bilirubin: 4.4 mg/dL — ABNORMAL HIGH (ref 0.3–1.2)
Total Protein: 6 g/dL — ABNORMAL LOW (ref 6.5–8.1)

## 2015-08-22 LAB — GLUCOSE, CAPILLARY: GLUCOSE-CAPILLARY: 124 mg/dL — AB (ref 65–99)

## 2015-08-22 LAB — PROTIME-INR
INR: 1.3 (ref 0.00–1.49)
Prothrombin Time: 16.3 seconds — ABNORMAL HIGH (ref 11.6–15.2)

## 2015-08-22 LAB — GRAM STAIN: GRAM STAIN: NONE SEEN

## 2015-08-22 LAB — LACTATE DEHYDROGENASE, PLEURAL OR PERITONEAL FLUID: LD FL: 26 U/L — AB (ref 3–23)

## 2015-08-22 LAB — GLUCOSE, PERITONEAL FLUID: GLUCOSE, PERITONEAL FLUID: 206 mg/dL

## 2015-08-22 LAB — PROTEIN, BODY FLUID: Total protein, fluid: <3 g/dL

## 2015-08-22 LAB — LIPASE, BLOOD: Lipase: 93 U/L — ABNORMAL HIGH (ref 11–51)

## 2015-08-22 LAB — TROPONIN I: Troponin I: 0.03 ng/mL (ref <0.031)

## 2015-08-22 MED ORDER — HYDROMORPHONE HCL 1 MG/ML IJ SOLN: 0.5000 mg | INTRAMUSCULAR | Status: DC | PRN

## 2015-08-22 MED ORDER — FUROSEMIDE 10 MG/ML IJ SOLN
100.0000 mg | Freq: Two times a day (BID) | INTRAVENOUS | Status: DC
  Administered 2015-08-22 – 2015-08-23 (×2): 100 mg via INTRAVENOUS
  Filled 2015-08-22 (×4): qty 10

## 2015-08-22 MED ORDER — SPIRONOLACTONE 25 MG PO TABS
ORAL_TABLET | ORAL | Status: AC
  Filled 2015-08-22: qty 2

## 2015-08-22 MED ORDER — ONDANSETRON HCL 4 MG/2ML IJ SOLN
4.0000 mg | Freq: Four times a day (QID) | INTRAMUSCULAR | Status: DC | PRN
  Administered 2015-08-23: 4 mg via INTRAVENOUS
  Filled 2015-08-22: qty 2

## 2015-08-22 MED ORDER — OXYCODONE HCL 5 MG PO TABS
10.0000 mg | ORAL_TABLET | Freq: Four times a day (QID) | ORAL | Status: DC | PRN
  Administered 2015-08-22 – 2015-08-26 (×11): 10 mg via ORAL
  Filled 2015-08-22 (×11): qty 2

## 2015-08-22 MED ORDER — HYDROCERIN EX CREA
1.0000 "application " | TOPICAL_CREAM | Freq: Three times a day (TID) | CUTANEOUS | Status: DC | PRN
  Filled 2015-08-22: qty 113

## 2015-08-22 MED ORDER — MAGNESIUM OXIDE 400 (241.3 MG) MG PO TABS
400.0000 mg | ORAL_TABLET | Freq: Two times a day (BID) | ORAL | Status: DC
  Administered 2015-08-22 – 2015-08-29 (×14): 400 mg via ORAL
  Filled 2015-08-22 (×14): qty 1

## 2015-08-22 MED ORDER — ALBUTEROL SULFATE HFA 108 (90 BASE) MCG/ACT IN AERS
2.0000 | INHALATION_SPRAY | Freq: Four times a day (QID) | RESPIRATORY_TRACT | Status: DC | PRN
  Filled 2015-08-22: qty 6.7

## 2015-08-22 MED ORDER — INSULIN GLARGINE 100 UNIT/ML ~~LOC~~ SOLN
10.0000 [IU] | Freq: Every day | SUBCUTANEOUS | Status: DC
  Administered 2015-08-22 – 2015-08-24 (×3): 10 [IU] via SUBCUTANEOUS
  Filled 2015-08-22 (×5): qty 0.1

## 2015-08-22 MED ORDER — CYCLOBENZAPRINE HCL 10 MG PO TABS
5.0000 mg | ORAL_TABLET | Freq: Every day | ORAL | Status: DC
  Administered 2015-08-22 – 2015-08-28 (×7): 5 mg via ORAL
  Filled 2015-08-22 (×7): qty 1

## 2015-08-22 MED ORDER — CITALOPRAM HYDROBROMIDE 20 MG PO TABS
20.0000 mg | ORAL_TABLET | Freq: Every day | ORAL | Status: DC
  Administered 2015-08-22 – 2015-08-29 (×8): 20 mg via ORAL
  Filled 2015-08-22 (×8): qty 1

## 2015-08-22 MED ORDER — HYDROXYZINE HCL 10 MG PO TABS
10.0000 mg | ORAL_TABLET | ORAL | Status: DC | PRN
  Administered 2015-08-23 – 2015-08-26 (×9): 10 mg via ORAL
  Filled 2015-08-22 (×9): qty 1

## 2015-08-22 MED ORDER — RIFAXIMIN 550 MG PO TABS
550.0000 mg | ORAL_TABLET | Freq: Two times a day (BID) | ORAL | Status: DC
  Administered 2015-08-22 – 2015-08-29 (×14): 550 mg via ORAL
  Filled 2015-08-22 (×14): qty 1

## 2015-08-22 MED ORDER — FUROSEMIDE 10 MG/ML IJ SOLN
INTRAMUSCULAR | Status: AC
  Filled 2015-08-22: qty 10

## 2015-08-22 MED ORDER — INSULIN ASPART 100 UNIT/ML ~~LOC~~ SOLN
0.0000 [IU] | Freq: Three times a day (TID) | SUBCUTANEOUS | Status: DC
  Administered 2015-08-23: 4 [IU] via SUBCUTANEOUS
  Administered 2015-08-23 – 2015-08-24 (×3): 3 [IU] via SUBCUTANEOUS
  Administered 2015-08-27: 4 [IU] via SUBCUTANEOUS
  Administered 2015-08-27 – 2015-08-28 (×4): 3 [IU] via SUBCUTANEOUS
  Administered 2015-08-28: 4 [IU] via SUBCUTANEOUS
  Administered 2015-08-29: 7 [IU] via SUBCUTANEOUS
  Administered 2015-08-29: 3 [IU] via SUBCUTANEOUS

## 2015-08-22 MED ORDER — ALBUTEROL SULFATE (2.5 MG/3ML) 0.083% IN NEBU
3.0000 mL | INHALATION_SOLUTION | Freq: Four times a day (QID) | RESPIRATORY_TRACT | Status: DC | PRN
  Administered 2015-08-23: 3 mL via RESPIRATORY_TRACT
  Filled 2015-08-22: qty 3

## 2015-08-22 MED ORDER — MORPHINE SULFATE (PF) 2 MG/ML IV SOLN
2.0000 mg | INTRAVENOUS | Status: AC | PRN
  Administered 2015-08-22 (×2): 2 mg via INTRAVENOUS
  Filled 2015-08-22 (×2): qty 1

## 2015-08-22 MED ORDER — SODIUM CHLORIDE 0.9 % IV SOLN
INTRAVENOUS | Status: DC
  Administered 2015-08-22: 19:00:00 via INTRAVENOUS

## 2015-08-22 MED ORDER — GLIPIZIDE 10 MG PO TABS
10.0000 mg | ORAL_TABLET | Freq: Every day | ORAL | Status: DC
  Administered 2015-08-23 – 2015-08-24 (×2): 10 mg via ORAL
  Filled 2015-08-22: qty 1
  Filled 2015-08-22: qty 2
  Filled 2015-08-22 (×4): qty 1
  Filled 2015-08-22: qty 2

## 2015-08-22 MED ORDER — ACETAMINOPHEN 500 MG PO TABS
500.0000 mg | ORAL_TABLET | Freq: Four times a day (QID) | ORAL | Status: DC | PRN
  Administered 2015-08-28: 500 mg via ORAL
  Filled 2015-08-22 (×2): qty 1

## 2015-08-22 MED ORDER — SODIUM CHLORIDE 0.9 % IV SOLN: INTRAVENOUS | Status: DC

## 2015-08-22 MED ORDER — LORAZEPAM 0.5 MG PO TABS
0.5000 mg | ORAL_TABLET | Freq: Two times a day (BID) | ORAL | Status: DC | PRN
  Administered 2015-08-23 – 2015-08-27 (×2): 0.5 mg via ORAL
  Filled 2015-08-22 (×3): qty 1

## 2015-08-22 MED ORDER — INSULIN ASPART 100 UNIT/ML ~~LOC~~ SOLN: 0.0000 [IU] | Freq: Every day | SUBCUTANEOUS | Status: DC

## 2015-08-22 MED ORDER — ONDANSETRON HCL 4 MG PO TABS: 4.0000 mg | ORAL_TABLET | Freq: Four times a day (QID) | ORAL | Status: DC | PRN

## 2015-08-22 MED ORDER — SPIRONOLACTONE 25 MG PO TABS
50.0000 mg | ORAL_TABLET | Freq: Every day | ORAL | Status: DC
  Administered 2015-08-22 – 2015-08-25 (×4): 50 mg via ORAL
  Filled 2015-08-22: qty 1
  Filled 2015-08-22: qty 2
  Filled 2015-08-22: qty 1
  Filled 2015-08-22 (×5): qty 2
  Filled 2015-08-22: qty 1

## 2015-08-22 MED ORDER — PANTOPRAZOLE SODIUM 40 MG PO TBEC
40.0000 mg | DELAYED_RELEASE_TABLET | Freq: Every day | ORAL | Status: DC
  Administered 2015-08-22 – 2015-08-29 (×8): 40 mg via ORAL
  Filled 2015-08-22 (×8): qty 1

## 2015-08-22 MED ORDER — LACTULOSE 10 GM/15ML PO SOLN
10.0000 g | Freq: Every day | ORAL | Status: DC
  Administered 2015-08-22 – 2015-08-26 (×5): 10 g via ORAL
  Filled 2015-08-22 (×5): qty 30

## 2015-08-22 NOTE — ED Provider Notes (Signed)
CSN: [id number]     Arrival date & time 08/22/15  1512 History   First MD Initiated Contact with Patient 08/22/15 1552     Chief Complaint  Patient presents with  . Abdominal Pain  . Shortness of Breath      HPI  Pt was seen at 1555.  Per pt, c/o gradual onset and persistence of constant generalized abd "pain" and "swelling" over the past 2 weeks.  Has been associated with increasing bilat LE's edema, coughing, SOB, and "itching."  Denies any change in his chronic diarrhea. Denies N/V, no fevers, no back pain, no rash, no CP/palpitations, no black or blood in stools.     GI: [name] Past Medical History  Diagnosis Date  . Hypertension   . Diabetes mellitus   . GERD (gastroesophageal reflux disease)     DEC 2010 EGD/Bx REACTIVE GASTROPATHY, NO VARICES  . Hemorrhoids, internal   . BMI 40.0-44.9, adult ([place]) OCT 2010 269 LBS    APR 2012 279 LBS AUG 2014 185 LBS  . Cirrhosis ([place]) NOV 2010 CHILD PUGH A    ETOH/HCV/OBESITY  . IV drug abuse REMOTE  . Hepatitis 2010 HEP C    AST 509 ALT 267 ALK PHOS 165 ALB 3.8 NEG IGM HAV/HBSAg  . Gallstone AUG 2012 1 CM  . GERD 10/25/2009  . COPD (chronic obstructive pulmonary disease) ([place])   . Hepatitis C   . Pancytopenia ([place]) 2013  . Splenomegaly 2013  . Other pancytopenia ([place]) 02/18/2013  . Iron (Fe) deficiency anemia 02/18/2013  . Splenomegaly 02/18/2013  . Neuropathy ([place])   . Biliary dyskinesia JUL 2015    HIDA GB EF 5%  . Chronic pain   . Chronic leg pain     left  . Portal venous hypertension ([facility])   . Asthma   . Medically noncompliant 05/30/2015   Past Surgical History  Procedure Laterality Date  . Sigmoidoscopy      2001 DR. FLEISCHMAN INTERNAL HERMORRHOIDS  . Upper gastrointestinal endoscopy  DEC 2010    BENIGN POLYPS, GASTRITIS, ?phg  . Knee surgery  RIGHT  . Hemorrhoid surgery    . Esophageal biopsy  09/08/2011    [name]:Moderate gastritis/Polyps, multiple in the body of the stomach  . Colonoscopy with propofol  N/A 11/15/2013    [name]: rectal varices, small AVMs  . Esophagogastroduodenoscopy (egd) with propofol N/A 11/15/2013    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 Next EGD in April 2016.     Family History  Problem Relation Age of Onset  . Colon cancer Neg Hx   . Anesthesia problems Neg Hx   . Hypotension Neg Hx   . Malignant hyperthermia Neg Hx   . Pseudochol deficiency Neg Hx   . Cancer Father    Social History  Substance Use Topics  . Smoking status: Never Smoker   . Smokeless tobacco: Never Used  . Alcohol Use: No     Comment: 30 years ago    Review of Systems ROS: Statement: All systems negative except as marked or noted in the HPI; Constitutional: Negative for fever and chills. ; ; Eyes: Negative for eye pain, redness and discharge. ; ; ENMT: Negative for ear pain, hoarseness, nasal congestion, sinus pressure and sore throat. ; ; Cardiovascular: Negative for chest pain, palpitations, diaphoresis, +dyspnea, +peripheral edema. ; ; Respiratory: +cough. Negative for wheezing and stridor. ; ; Gastrointestinal: +abd pain and distention. Negative for  nausea, vomiting, blood in stool, hematemesis, jaundice and rectal bleeding. . ; ; Genitourinary: Negative for dysuria, flank pain and hematuria. ; ; Musculoskeletal: Negative for back pain and neck pain. Negative for swelling and trauma.; ; Skin: +"itching." Negative for rash, abrasions, blisters, bruising and skin lesion.; ; Neuro: Negative for headache, lightheadedness and neck stiffness. Negative for weakness, altered level of consciousness , altered mental status, extremity weakness, paresthesias, involuntary movement, seizure and syncope.      Allergies  Penicillins  Home Medications   Prior to Admission medications   Medication Sig Start Date End Date Taking? Authorizing Provider  acetaminophen (TYLENOL) 500 MG tablet Take 500 mg by mouth every 6 (six) hours as needed  for moderate pain. Reported on 07/27/2015    Historical Provider, MD  albuterol (PROVENTIL HFA;VENTOLIN HFA) 108 (90 BASE) MCG/ACT inhaler Inhale 2 puffs into the lungs every 6 (six) hours as needed. Shortness of breath 08/14/14   [name], MD  citalopram (CELEXA) 20 MG tablet Take 1 tablet (20 mg total) by mouth daily. 07/24/15   [name], MD  cyclobenzaprine (FLEXERIL) 5 MG tablet Take 1 tablet (5 mg total) by mouth at bedtime. 08/01/15   [name], MD  furosemide (LASIX) 40 MG tablet Take 2 tablets (80 mg total) by mouth 2 (two) times daily. 07/17/15   Bonnielee Haff, MD  glipiZIDE (GLUCOTROL) 10 MG tablet TAKE 1 TABLET (10 MG TOTAL) BY MOUTH DAILY. 06/04/15   [name], MD  glucose blood test strip Use twice daily to test blood sugar. Dx: E11.9 07/19/15   [name], MD  hydrocerin (EUCERIN) CREA Apply 1 application topically 3 (three) times daily as needed (for itching/dry skin). 06/26/15   Erline Hau, MD  hydrOXYzine (ATARAX/VISTARIL) 10 MG tablet TAKE 1 TABLET (10 MG TOTAL) BY MOUTH EVERY 4 (FOUR) HOURS AS NEEDED FOR ITCHING. 05/03/15   [name], MD  Insulin Glargine (LANTUS SOLOSTAR) 100 UNIT/ML Solostar Pen Inject 10 Units into the skin at bedtime.    Historical Provider, MD  lactulose (CHRONULAC) 10 GM/15ML solution Take 15 mLs (10 g total) by mouth daily. TAKE 15 MLS (10 G TOTAL) BY MOUTH 3 (THREE) TIMES DAILY. 07/17/15   Bonnielee Haff, MD  LORazepam (ATIVAN) 0.5 MG tablet Take 1 tablet (0.5 mg total) by mouth 2 (two) times daily as needed for anxiety. 08/03/15   Sharion Balloon, FNP  magnesium oxide (MAG-OX) 400 (241.3 MG) MG tablet Take 1 tablet (400 mg total) by mouth 2 (two) times daily. 07/17/15   Bonnielee Haff, MD  omeprazole (PRILOSEC) 20 MG capsule Take 2 capsules (40 mg total) by mouth daily. 07/04/15   [name], MD  Oxycodone HCl 10 MG TABS Take 1 tablet (10 mg total) by mouth every 6 (six) hours as needed. 08/01/15    [name], MD  oxyCODONE-acetaminophen (PERCOCET) 10-325 MG tablet Take 1 tablet by mouth every 8 (eight) hours as needed for pain. Patient not taking: Reported on 07/27/2015 07/25/15   [name], MD  rifaximin (XIFAXAN) 550 MG TABS tablet Take 1 tablet (550 mg total) by mouth 2 (two) times daily. 12/19/14   [name], MD  spironolactone (ALDACTONE) 50 MG tablet Take 1 tablet (50 mg total) by mouth daily. 07/17/15   Bonnielee Haff, MD   BP 127/86 mmHg  Pulse 98  Temp(Src) 97.5 F (36.4 C) (Oral)  Resp 26  Ht 5' 6" (1.676 m)  Wt 319  lb (144.697 kg)  BMI 51.51 kg/m2  SpO2 100% Physical Exam  1600: Physical examination:  Nursing notes reviewed; Vital signs and O2 SAT reviewed;  Constitutional: Well developed, Well nourished, Well hydrated, Uncomfortable appearing.; Head:  Normocephalic, atraumatic; Eyes: EOMI, PERRL, No scleral icterus; ENMT: Mouth and pharynx normal, Mucous membranes moist; Neck: Supple, Full range of motion, No lymphadenopathy; Cardiovascular: Regular rate and rhythm, No gallop; Respiratory: Breath sounds coarse & equal bilaterally, No wheezes.  Speaking full sentences, intermittently hyperventilating during exam. Normal respiratory effort/excursion; Chest: Nontender, Movement normal; Abdomen: +tense, distended, diffusely tender to palp. Normal bowel sounds. Scattered excoriations on abd wall.; Genitourinary: No CVA tenderness; Extremities: Pulses normal, No tenderness, +2 pedal edema bilat without calf asymmetry.; Neuro: AA&Ox3, Major CN grossly intact.  Speech clear. No gross focal motor deficits in extremities.; Skin: Color normal, Warm, Dry.   ED Course  Procedures (including critical care time)   Labs Review  Imaging Review  I have personally reviewed and evaluated these images and lab results as part of my medical decision-making.   EKG Interpretation   Date/Time:  Wednesday August 22 2015 15:22:21 EST Ventricular Rate:  95 PR Interval:   138 QRS Duration: 88 QT Interval:  376 QTC Calculation: 472 R Axis:   17 Text Interpretation:  Normal sinus rhythm Normal ECG Sinus rhythm  Non-specific intra-ventricular conduction delay Borderline ECG Confirmed  by Carmin Muskrat  MD [phone]) on 08/22/2015 3:25:41 PM      MDM  MDM Reviewed: previous chart, nursing note and vitals Reviewed previous: labs and ECG Interpretation: labs, ECG, x-ray and ultrasound      Results for orders placed or performed during the hospital encounter of 08/22/15  Comprehensive metabolic panel  Result Value Ref Range   Sodium 137 135 - 145 mmol/L   Potassium 5.4 (H) 3.5 - 5.1 mmol/L   Chloride 107 101 - 111 mmol/L   CO2 25 22 - 32 mmol/L   Glucose, Bld 222 (H) 65 - 99 mg/dL   BUN 49 (H) 6 - 20 mg/dL   Creatinine, Ser 1.85 (H) 0.61 - 1.24 mg/dL   Calcium 7.9 (L) 8.9 - 10.3 mg/dL   Total Protein 6.0 (L) 6.5 - 8.1 g/dL   Albumin 2.1 (L) 3.5 - 5.0 g/dL   AST 117 (H) 15 - 41 U/L   ALT 51 17 - 63 U/L   Alkaline Phosphatase 120 38 - 126 U/L   Total Bilirubin 4.4 (H) 0.3 - 1.2 mg/dL   GFR calc non Af Amer 40 (L) >60 mL/min   GFR calc Af Amer 46 (L) >60 mL/min   Anion gap 5 5 - 15  Lipase, blood  Result Value Ref Range   Lipase 93 (H) 11 - 51 U/L  CBC with Differential  Result Value Ref Range   WBC 5.9 4.0 - 10.5 K/uL   RBC 3.15 (L) 4.22 - 5.81 MIL/uL   Hemoglobin 10.2 (L) 13.0 - 17.0 g/dL   HCT 30.5 (L) 39.0 - 52.0 %   MCV 96.8 78.0 - 100.0 fL   MCH 32.4 26.0 - 34.0 pg   MCHC 33.4 30.0 - 36.0 g/dL   RDW 16.7 (H) 11.5 - 15.5 %   Platelets 61 (L) 150 - 400 K/uL   Neutrophils Relative % 57 %   Neutro Abs 3.4 1.7 - 7.7 K/uL   Lymphocytes Relative 24 %   Lymphs Abs 1.4 0.7 - 4.0 K/uL   Monocytes Relative 16 %   Monocytes Absolute 1.0 0.1 -  1.0 K/uL   Eosinophils Relative 2 %   Eosinophils Absolute 0.1 0.0 - 0.7 K/uL   Basophils Relative 0 %   Basophils Absolute 0.0 0.0 - 0.1 K/uL  Protime-INR  Result Value Ref Range   Prothrombin  Time 16.3 (H) 11.6 - 15.2 seconds   INR 1.30 0.00 - 1.49  Troponin I  Result Value Ref Range   Troponin I 0.03 <0.031 ng/mL   Dg Chest 2 View 08/22/2015  CLINICAL DATA:  Productive cough and shortness of breath for 2 weeks. COPD. Cirrhosis. EXAM: CHEST  2 VIEW COMPARISON:  07/02/2015 FINDINGS: The heart size and mediastinal contours are within normal limits. Both lungs are clear. The visualized skeletal structures are unremarkable. IMPRESSION: No active cardiopulmonary disease. Electronically Signed   By: Earle Gell M.D.   On: 08/22/2015 17:51   US Abdomen Limited 08/22/2015  CLINICAL DATA:  Increased abdominal distention. Ascites. Cirrhosis. EXAM: LIMITED ABDOMEN ULTRASOUND FOR ASCITES TECHNIQUE: Limited ultrasound survey for ascites was performed in all four abdominal quadrants. COMPARISON:  None. FINDINGS: Moderate ascites is seen in all 4 abdominal quadrants. IMPRESSION: Moderate ascites visualized in all 4 abdominal quadrants. Electronically Signed   By: Earle Gell M.D.   On: 08/22/2015 16:23   US Paracentesis 08/22/2015  CLINICAL DATA:  Cirrhosis, ascites, abdominal swelling EXAM: ULTRASOUND GUIDED DIAGNOSTIC AND THERAPEUTIC PARACENTESIS COMPARISON:  None PROCEDURE: Procedure, benefits, and risks of procedure were discussed with patient. Written informed consent for procedure was obtained. Time out protocol followed. Adequate collection of ascites localized by ultrasound in LEFT lower quadrant. Skin prepped and draped in usual sterile fashion. Skin and soft tissues anesthetized with 10 mL of 1% lidocaine. 5 [place] Yueh catheter placed into peritoneal cavity. 2300 mL of yellow ascites aspirated by vacuum bottle suction. Procedure tolerated well by patient without immediate complication. COMPLICATIONS: None. FINDINGS: A total of approximately 2300 mL of ascitic fluid was removed. A fluid sample of 180 mL was sent for laboratory analysis. IMPRESSION: Successful ultrasound guided paracentesis yielding  2300 mL of ascites. Electronically Signed   By: Lavonia Dana M.D.   On: 08/22/2015 17:29   Results for RAGAN, REALE (MRN [id number]) as of 08/22/2015 18:43  Ref. Range 06/24/2015 06:28 07/02/2015 10:05 07/03/2015 06:34 08/22/2015 17:29  BUN Latest Ref Range: 6-20 mg/dL 35 (H) 39 (H) 36 (H) 49 (H)  Creatinine Latest Ref Range: 0.61-1.24 mg/dL 1.81 (H) 2.22 (H) 2.12 (H) 1.85 (H)  Albumin Latest Ref Range: 3.5-5.0 g/dL 2.3 (L) 2.2 (L) 2.4 (L) 2.1 (L)  AST Latest Ref Range: 15-41 U/L 197 (H) 226 (H) 235 (H) 117 (H)  ALT Latest Ref Range: 17-63 U/L 75 (H) 71 (H) 72 (H) 51  Total Bilirubin Latest Ref Range: 0.3-1.2 mg/dL 3.1 (H) 1.8 (H) 2.1 (H) 4.4 (H)     1850:  Pt denies hx of previous paracentesis.  Mod amt of ascites seen on [place] today; paracentesis performed as above. Fluid sent to lab by Rads MD (verified with lab). BUN/Cr elevated from baseline; judicious IVF given. Dx and testing d/w pt and family.  Questions answered.  Verb understanding, agreeable to admit.  T/C to [facility] Dr. Anastasio Champion, case discussed, including:  HPI, pertinent PM/SHx, VS/PE, dx testing, ED course and treatment:  Agreeable to admit, requests to write temporary orders, obtain medical bed to team APAdmits.    Francine Graven, DO 08/25/15 1549

## 2015-08-22 NOTE — Telephone Encounter (Signed)
REVIEWED-NO ADDITIONAL RECOMMENDATIONS. 

## 2015-08-22 NOTE — ED Notes (Signed)
US at bedside

## 2015-08-22 NOTE — Procedures (Signed)
PreOperative Dx: Cirrhosis, ascites Postoperative Dx: Cirrhosis, ascites Procedure:   US guided paracentesis Radiologist:  Thornton Papas Anesthesia:  10 ml of 1% lidocaine Specimen:  2300 ml of yellow ascitic fluid EBL:   < 1 ml Complications: None

## 2015-08-22 NOTE — H&P (Signed)
[facility] History and Physical  [name] CBS:[id number] DOB: 1961/07/12 DOA: 08/22/2015  Referring physician: ER PCP: [name], MD   Chief Complaint: Abdominal swelling  HPI: [name] is a 55 y.o. male  This is a 55 year old man who has a history of cirrhosis of the liver secondary to a combination of alcoholism and hepatitis C, who now presents with 3-4 day history of increasing abdominal swelling and pain associated with diarrhea and cough, dyspnea and itching. He denies any hematemesis. There is been no melanotic stool. There is no rectal bleeding. He denies a fever. He denies any current alcohol intake. Evaluation in the emergency room showed him to have quite massive ascites and radiology has already drained 2.3 L of ascites. The ascites has been sent for diagnostic studies. He is now being admitted for further management.   Review of Systems:  Apart from symptoms above, all systems are negative.  Past Medical History  Diagnosis Date  . Hypertension   . Diabetes mellitus   . GERD (gastroesophageal reflux disease)     DEC 2010 EGD/Bx REACTIVE GASTROPATHY, NO VARICES  . Hemorrhoids, internal   . BMI 40.0-44.9, adult ([place]) OCT 2010 269 LBS    APR 2012 279 LBS AUG 2014 185 LBS  . Cirrhosis ([place]) NOV 2010 CHILD PUGH A    ETOH/HCV/OBESITY  . IV drug abuse REMOTE  . Hepatitis 2010 HEP C    AST 509 ALT 267 ALK PHOS 165 ALB 3.8 NEG IGM HAV/HBSAg  . Gallstone AUG 2012 1 CM  . GERD 10/25/2009  . COPD (chronic obstructive pulmonary disease) ([place])   . Hepatitis C   . Pancytopenia ([place]) 2013  . Splenomegaly 2013  . Other pancytopenia ([place]) 02/18/2013  . Iron (Fe) deficiency anemia 02/18/2013  . Splenomegaly 02/18/2013  . Neuropathy ([place])   . Biliary dyskinesia JUL 2015    HIDA GB EF 5%  . Chronic pain   . Chronic leg pain     left  . Portal venous hypertension ([facility])   . Asthma   . Medically noncompliant 05/30/2015   Past Surgical History  Procedure  Laterality Date  . Sigmoidoscopy      2001 DR. FLEISCHMAN INTERNAL HERMORRHOIDS  . Upper gastrointestinal endoscopy  DEC 2010    BENIGN POLYPS, GASTRITIS, ?phg  . Knee surgery  RIGHT  . Hemorrhoid surgery    . Esophageal biopsy  09/08/2011    [name]:Moderate gastritis/Polyps, multiple in the body of the stomach  . Colonoscopy with propofol N/A 11/15/2013    [name]: rectal varices, small AVMs  . Esophagogastroduodenoscopy (egd) with propofol N/A 11/15/2013    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 Next EGD in April 2016.     Social History:  reports that he has never smoked. He has never used smokeless tobacco. He reports that he does not drink alcohol or use illicit drugs.  Allergies  Allergen Reactions  . Penicillins Hives    Has patient had a PCN reaction causing immediate rash, facial/tongue/throat swelling, SOB or lightheadedness with hypotension: Yes Has patient had a PCN reaction causing severe rash involving mucus membranes or skin necrosis: Yes Has patient had a PCN reaction that required hospitalization No Has patient had a PCN reaction occurring within the last 10 years: No If all of the above answers are "NO", then may proceed with Cephalosporin use.     Family History  Problem Relation Age of  Onset  . Colon cancer Neg Hx   . Anesthesia problems Neg Hx   . Hypotension Neg Hx   . Malignant hyperthermia Neg Hx   . Pseudochol deficiency Neg Hx   . Cancer Father     Prior to Admission medications   Medication Sig Start Date End Date Taking? Authorizing Provider  citalopram (CELEXA) 20 MG tablet Take 1 tablet (20 mg total) by mouth daily. 07/24/15  Yes [name], MD  cyclobenzaprine (FLEXERIL) 5 MG tablet Take 1 tablet (5 mg total) by mouth at bedtime. 08/01/15  Yes [name], MD  furosemide (LASIX) 40 MG tablet Take 2 tablets (80 mg total) by mouth 2 (two) times daily. 07/17/15  Yes Bonnielee Haff, MD  glipiZIDE (GLUCOTROL) 10 MG tablet TAKE 1 TABLET (10 MG TOTAL) BY MOUTH DAILY. 06/04/15  Yes [name], MD  hydrOXYzine (ATARAX/VISTARIL) 10 MG tablet TAKE 1 TABLET (10 MG TOTAL) BY MOUTH EVERY 4 (FOUR) HOURS AS NEEDED FOR ITCHING. 05/03/15  Yes [name], MD  Insulin Glargine (LANTUS SOLOSTAR) 100 UNIT/ML Solostar Pen Inject 10 Units into the skin at bedtime.   Yes Historical Provider, MD  lactulose (CHRONULAC) 10 GM/15ML solution Take 15 mLs (10 g total) by mouth daily. TAKE 15 MLS (10 G TOTAL) BY MOUTH 3 (THREE) TIMES DAILY. 07/17/15  Yes Bonnielee Haff, MD  LORazepam (ATIVAN) 0.5 MG tablet Take 1 tablet (0.5 mg total) by mouth 2 (two) times daily as needed for anxiety. 08/03/15  Yes Sharion Balloon, FNP  magnesium oxide (MAG-OX) 400 (241.3 MG) MG tablet Take 1 tablet (400 mg total) by mouth 2 (two) times daily. 07/17/15  Yes Bonnielee Haff, MD  omeprazole (PRILOSEC) 20 MG capsule Take 2 capsules (40 mg total) by mouth daily. 07/04/15  Yes [name], MD  Oxycodone HCl 10 MG TABS Take 1 tablet (10 mg total) by mouth every 6 (six) hours as needed. Patient taking differently: Take 10 mg by mouth every 6 (six) hours as needed (pain).  08/01/15  Yes [name], MD  rifaximin (XIFAXAN) 550 MG TABS tablet Take 1 tablet (550 mg total) by mouth 2 (two) times daily. 12/19/14  Yes [name], MD  acetaminophen (TYLENOL) 500 MG tablet Take 500 mg by mouth every 6 (six) hours as needed for moderate pain. Reported on 07/27/2015    Historical Provider, MD  albuterol (PROVENTIL HFA;VENTOLIN HFA) 108 (90 BASE) MCG/ACT inhaler Inhale 2 puffs into the lungs every 6 (six) hours as needed. Shortness of breath 08/14/14   [name], MD  glucose blood test strip Use twice daily to test blood sugar. Dx: E11.9 07/19/15   [name], MD  hydrocerin (EUCERIN) CREA Apply 1 application topically 3 (three) times daily as needed (for itching/dry skin). 06/26/15   Erline Hau, MD  oxyCODONE-acetaminophen (PERCOCET) 10-325 MG tablet Take 1 tablet by mouth every 8 (eight) hours as needed for pain. Patient not taking: Reported on 07/27/2015 07/25/15   [name], MD  spironolactone (ALDACTONE) 50 MG tablet Take 1 tablet (50 mg total) by mouth daily. Patient taking differently: Take 50 mg by mouth 3 (three) times a week. Monday,Wednesday,Friday 07/17/15   Bonnielee Haff, MD   Physical Exam: Filed Vitals:   08/22/15 1800 08/22/15 1845 08/22/15 1927 08/22/15 2000  BP: 121/101 150/62 157/72 135/79  Pulse: 96 88 100 86  Temp:   97.7 F (36.5 C)   TempSrc:   Oral  Resp:   24   Height:      Weight:      SpO2: 100% 100% 100% 100%    Wt Readings from Last 3 Encounters:  08/22/15 144.697 kg (319 lb)  07/27/15 129.729 kg (286 lb)  07/24/15 130.749 kg (288 lb 4 oz)    General:  Appears jaundiced. He is not toxic or septic clinically. He is alert and orientated. There is no evidence of hepatic encephalopathy clinically.  Eyes: PERRL, normal lids, irises & conjunctiva ENT: grossly normal hearing, lips & tongue Neck: no LAD, masses or thyromegaly Cardiovascular: RRR, no m/r/g. No LE edema. Telemetry: SR, no arrhythmias  Respiratory: CTA bilaterally, no w/r/r. Normal respiratory effort. Abdomen: soft, distended abdomen with clinical ascites. Mild tenderness but no rebound or guarding. There is no clinical evidence of an acute abdomen.. Bowel sounds are heard and are normal.  Skin: no rash or induration seen on limited exam Musculoskeletal: grossly normal tone BUE/BLE Psychiatric: grossly normal mood and affect, speech fluent and appropriate Neurologic: grossly non-focal.          Labs on Admission:  Basic Metabolic Panel:  Recent Labs Lab 08/22/15 1729  NA 137  K 5.4*  CL 107  CO2 25  GLUCOSE 222*  BUN 49*  CREATININE 1.85*  CALCIUM 7.9*   Liver Function Tests:  Recent Labs Lab 08/22/15 1729  AST 117*  ALT 51  ALKPHOS 120   BILITOT 4.4*  PROT 6.0*  ALBUMIN 2.1*    Recent Labs Lab 08/22/15 1729  LIPASE 93*   No results for input(s): AMMONIA in the last 168 hours. CBC:  Recent Labs Lab 08/22/15 1729  WBC 5.9  NEUTROABS 3.4  HGB 10.2*  HCT 30.5*  MCV 96.8  PLT 61*   Cardiac Enzymes:  Recent Labs Lab 08/22/15 1729  TROPONINI 0.03    BNP (last 3 results)  Recent Labs  06/11/15 1546  BNP 89.0    ProBNP (last 3 results) No results for input(s): PROBNP in the last 8760 hours.  CBG: No results for input(s): GLUCAP in the last 168 hours.  Radiological Exams on Admission: Dg Chest 2 View  08/22/2015  CLINICAL DATA:  Productive cough and shortness of breath for 2 weeks. COPD. Cirrhosis. EXAM: CHEST  2 VIEW COMPARISON:  07/02/2015 FINDINGS: The heart size and mediastinal contours are within normal limits. Both lungs are clear. The visualized skeletal structures are unremarkable. IMPRESSION: No active cardiopulmonary disease. Electronically Signed   By: Earle Gell M.D.   On: 08/22/2015 17:51   US Abdomen Limited  08/22/2015  CLINICAL DATA:  Increased abdominal distention. Ascites. Cirrhosis. EXAM: LIMITED ABDOMEN ULTRASOUND FOR ASCITES TECHNIQUE: Limited ultrasound survey for ascites was performed in all four abdominal quadrants. COMPARISON:  None. FINDINGS: Moderate ascites is seen in all 4 abdominal quadrants. IMPRESSION: Moderate ascites visualized in all 4 abdominal quadrants. Electronically Signed   By: Earle Gell M.D.   On: 08/22/2015 16:23   US Paracentesis  08/22/2015  CLINICAL DATA:  Cirrhosis, ascites, abdominal swelling EXAM: ULTRASOUND GUIDED DIAGNOSTIC AND THERAPEUTIC PARACENTESIS COMPARISON:  None PROCEDURE: Procedure, benefits, and risks of procedure were discussed with patient. Written informed consent for procedure was obtained. Time out protocol followed. Adequate collection of ascites localized by ultrasound in LEFT lower quadrant. Skin prepped and draped in usual sterile  fashion. Skin and soft tissues anesthetized with 10 mL of 1% lidocaine. 5 [place] Yueh catheter placed into peritoneal cavity. 2300 mL of yellow ascites aspirated by vacuum bottle suction.  Procedure tolerated well by patient without immediate complication. COMPLICATIONS: None. FINDINGS: A total of approximately 2300 mL of ascitic fluid was removed. A fluid sample of 180 mL was sent for laboratory analysis. IMPRESSION: Successful ultrasound guided paracentesis yielding 2300 mL of ascites. Electronically Signed   By: Lavonia Dana M.D.   On: 08/22/2015 17:29     Assessment/Plan   1. Ascites. He has clinically decompensated chronic liver disease now. He ordered he has had initial paracentesis. The radiologist did not feel he should take off more fluid as this is his first episode of paracentesis. Fluid has been sent for diagnostic studies including culture. Start intravenous Lasix. Gastroenterology consultation. 2. Diabetes. Continue with home medications and sliding scale of insulin. 3. Acute on chronic renal disease. The etiology is not entirely clear. He may have hepatorenal syndrome. Monitor renal function closely especially with intravenous Lasix. Daily weights and strict input/output records.  He'll be admitted to the medical floor. Further recommendations will depend on patient's hospital progress.   Code Status: full code.   DVT Prophylaxis: SCDs.   Family Communication: I discussed the plan with the patient at the bedside.   Disposition Plan: home when medically stable.  Time spent: 60 minutes.   Doree Albee [facility] Pager [phone].

## 2015-08-22 NOTE — ED Notes (Signed)
MD at bedside. 

## 2015-08-22 NOTE — Telephone Encounter (Signed)
I have two boxes of the Xifaxin to give pt at his appt. Almyra Free said pt's wife was supposed to have completed paperwork for Patient Assistance.

## 2015-08-22 NOTE — ED Notes (Signed)
Provided patient with po fluid. Tolerating well.

## 2015-08-22 NOTE — ED Notes (Signed)
Pt reports severe left sided and epigastric pain x 1 1/2 weeks with n/v.  Reports has been itching a lot.  Pt reports abd swelling.  Pt appears jaundice.  Reports pt has went from weighing 288lb to 319 since nov.

## 2015-08-23 ENCOUNTER — Other Ambulatory Visit: Payer: Self-pay | Admitting: *Deleted

## 2015-08-23 ENCOUNTER — Encounter (HOSPITAL_COMMUNITY): Payer: Self-pay | Admitting: *Deleted

## 2015-08-23 ENCOUNTER — Ambulatory Visit: Payer: Medicare Other | Admitting: Gastroenterology

## 2015-08-23 DIAGNOSIS — N289 Disorder of kidney and ureter, unspecified: Secondary | ICD-10-CM

## 2015-08-23 DIAGNOSIS — B182 Chronic viral hepatitis C: Secondary | ICD-10-CM

## 2015-08-23 DIAGNOSIS — K7460 Unspecified cirrhosis of liver: Secondary | ICD-10-CM

## 2015-08-23 DIAGNOSIS — R601 Generalized edema: Secondary | ICD-10-CM

## 2015-08-23 LAB — GLUCOSE, CAPILLARY
GLUCOSE-CAPILLARY: 126 mg/dL — AB (ref 65–99)
GLUCOSE-CAPILLARY: 159 mg/dL — AB (ref 65–99)
Glucose-Capillary: 156 mg/dL — ABNORMAL HIGH (ref 65–99)
Glucose-Capillary: 102 mg/dL — ABNORMAL HIGH (ref 65–99)

## 2015-08-23 LAB — COMPREHENSIVE METABOLIC PANEL
ALT: 49 U/L (ref 17–63)
AST: 113 U/L — AB (ref 15–41)
Albumin: 2.1 g/dL — ABNORMAL LOW (ref 3.5–5.0)
Alkaline Phosphatase: 106 U/L (ref 38–126)
Anion gap: 7 (ref 5–15)
BUN: 49 mg/dL — ABNORMAL HIGH (ref 6–20)
CHLORIDE: 105 mmol/L (ref 101–111)
CO2: 24 mmol/L (ref 22–32)
Calcium: 7.9 mg/dL — ABNORMAL LOW (ref 8.9–10.3)
Creatinine, Ser: 1.8 mg/dL — ABNORMAL HIGH (ref 0.61–1.24)
GFR, EST AFRICAN AMERICAN: 48 mL/min — AB (ref >60)
GFR, EST NON AFRICAN AMERICAN: 41 mL/min — AB (ref >60)
Glucose, Bld: 140 mg/dL — ABNORMAL HIGH (ref 65–99)
POTASSIUM: 5 mmol/L (ref 3.5–5.1)
Sodium: 136 mmol/L (ref 135–145)
Total Bilirubin: 4.7 mg/dL — ABNORMAL HIGH (ref 0.3–1.2)
Total Protein: 5.8 g/dL — ABNORMAL LOW (ref 6.5–8.1)

## 2015-08-23 LAB — CBC
HEMATOCRIT: 28 % — AB (ref 39.0–52.0)
Hemoglobin: 9.4 g/dL — ABNORMAL LOW (ref 13.0–17.0)
MCH: 32.4 pg (ref 26.0–34.0)
MCHC: 33.6 g/dL (ref 30.0–36.0)
MCV: 96.6 fL (ref 78.0–100.0)
Platelets: 58 10*3/uL — ABNORMAL LOW (ref 150–400)
RBC: 2.9 MIL/uL — AB (ref 4.22–5.81)
RDW: 16.6 % — ABNORMAL HIGH (ref 11.5–15.5)
WBC: 4.3 10*3/uL (ref 4.0–10.5)

## 2015-08-23 LAB — PROTIME-INR
INR: 1.35 (ref 0.00–1.49)
Prothrombin Time: 16.8 seconds — ABNORMAL HIGH (ref 11.6–15.2)

## 2015-08-23 MED ORDER — SODIUM CHLORIDE 0.9 % IV SOLN: 250.0000 mL | INTRAVENOUS | Status: DC | PRN

## 2015-08-23 MED ORDER — ALBUMIN HUMAN 25 % IV SOLN
50.0000 g | Freq: Three times a day (TID) | INTRAVENOUS | Status: AC
  Administered 2015-08-23 – 2015-08-24 (×3): 50 g via INTRAVENOUS
  Filled 2015-08-23 (×3): qty 200

## 2015-08-23 MED ORDER — SODIUM CHLORIDE 0.9 % IJ SOLN
3.0000 mL | Freq: Two times a day (BID) | INTRAMUSCULAR | Status: DC
  Administered 2015-08-23 – 2015-08-29 (×12): 3 mL via INTRAVENOUS

## 2015-08-23 MED ORDER — SODIUM CHLORIDE 0.9 % IJ SOLN
3.0000 mL | INTRAMUSCULAR | Status: DC | PRN
  Administered 2015-08-23: 3 mL via INTRAVENOUS
  Filled 2015-08-23: qty 3

## 2015-08-23 MED ORDER — FUROSEMIDE 10 MG/ML IJ SOLN
20.0000 mg | Freq: Three times a day (TID) | INTRAMUSCULAR | Status: AC
  Administered 2015-08-23 – 2015-08-24 (×3): 20 mg via INTRAVENOUS
  Filled 2015-08-23 (×3): qty 2

## 2015-08-23 MED ORDER — PNEUMOCOCCAL VAC POLYVALENT 25 MCG/0.5ML IJ INJ
0.5000 mL | INJECTION | INTRAMUSCULAR | Status: AC
  Administered 2015-08-24: 0.5 mL via INTRAMUSCULAR
  Filled 2015-08-23: qty 0.5
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
## 2015-08-23 NOTE — Progress Notes (Signed)
[facility] PROGRESS NOTE  [name] [id number] DOB: Dec 23, 1960 DOA: 08/22/2015 PCP: [name], MD  Assessment/Plan: 1. Decompensated cirrhosis with Ascites, s/p paracentesis with removal of 2370ml yellow fluid. He has been seen by GI and started on intravenous lasix with albumin infusions. Will continue to monitor volume status and urine output  2. DM type 2, stable. Continue SSI, basal insulin and glipizide 3. AKI superimposed on CKD stage 3, creatinine is unchanged since admission. He is receiving IV lasix and has evidence of volume overload. Will continue to monitor..  4. Thrombocytopenia, chronic, related to liver disease  Code Status: Full DVT prophylaxis: SCDs Family Communication: no family present Disposition Plan: discharge home once improved   Consultants:  Gastroenterology  Procedures:  Paracentesis 1/11 with removal of 2.3L   Antibiotics:    HPI/Subjective: Feeling better after paracentesis. Abdominal pain improving. No shortness of breath, has occasional cramps in legs and arms  Objective: Filed Vitals:   08/23/15 0653 08/23/15 1456  BP: 150/68 130/61  Pulse: 83 83  Temp: 97 F (36.1 C) 98.1 F (36.7 C)  Resp: 19 19    Intake/Output Summary (Last 24 hours) at 08/23/15 1859 Last data filed at 08/23/15 1821  Gross per 24 hour  Intake   1766 ml  Output   1050 ml  Net    716 ml   Filed Weights   08/22/15 2022 08/23/15 0653 08/23/15 0929  Weight: 142.475 kg (314 lb 1.6 oz) 142.475 kg (314 lb 1.6 oz) 144.244 kg (318 lb)    Exam:  General: NAD, looks comfortable Cardiovascular: RRR, S1, S2  Respiratory: clear bilaterally, No wheezing, rales or rhonchi Abdomen: soft, distended, mildly tender in LLQ, BS+ Musculoskeletal: 2+ edema b/l   Data Reviewed: Basic Metabolic Panel:  Recent Labs Lab 08/22/15 1729 08/23/15 0631  NA 137 136  K 5.4* 5.0  CL 107 105  CO2 25 24  GLUCOSE 222* 140*  BUN 49* 49*  CREATININE 1.85*  1.80*  CALCIUM 7.9* 7.9*   Liver Function Tests:  Recent Labs Lab 08/22/15 1729 08/23/15 0631  AST 117* 113*  ALT 51 49  ALKPHOS 120 106  BILITOT 4.4* 4.7*  PROT 6.0* 5.8*  ALBUMIN 2.1* 2.1*    Recent Labs Lab 08/22/15 1729  LIPASE 93*   CBC:  Recent Labs Lab 08/22/15 1729 08/23/15 0631  WBC 5.9 4.3  NEUTROABS 3.4  --   HGB 10.2* 9.4*  HCT 30.5* 28.0*  MCV 96.8 96.6  PLT 61* 58*   Cardiac Enzymes:  Recent Labs Lab 08/22/15 1729  TROPONINI 0.03   BNP (last 3 results)  Recent Labs  06/11/15 1546  BNP 89.0    CBG:  Recent Labs Lab 08/22/15 2105 08/23/15 0721 08/23/15 1055 08/23/15 1647  GLUCAP 124* 126* 156* 159*    Recent Results (from the past 240 hour(s))  Culture, body fluid-bottle     Status: None (Preliminary result)   Collection Time: 08/22/15  4:45 PM  Result Value Ref Range Status   Specimen Description ASCITIC  Final   Special Requests BOTTLES DRAWN AEROBIC AND ANAEROBIC 8CC EACH  Final   Culture PENDING  Incomplete   Report Status PENDING  Incomplete  Gram stain     Status: None   Collection Time: 08/22/15  4:45 PM  Result Value Ref Range Status   Specimen Description ASCITIC  Final   Special Requests NONE  Final   Gram Stain NO ORGANISMS SEEN RARE WBC SEEN   Final  Report Status 08/22/2015 FINAL  Final     Studies: Dg Chest 2 View  08/22/2015  CLINICAL DATA:  Productive cough and shortness of breath for 2 weeks. COPD. Cirrhosis. EXAM: CHEST  2 VIEW COMPARISON:  07/02/2015 FINDINGS: The heart size and mediastinal contours are within normal limits. Both lungs are clear. The visualized skeletal structures are unremarkable. IMPRESSION: No active cardiopulmonary disease. Electronically Signed   By: Earle Gell M.D.   On: 08/22/2015 17:51   US Abdomen Limited  08/22/2015  CLINICAL DATA:  Increased abdominal distention. Ascites. Cirrhosis. EXAM: LIMITED ABDOMEN ULTRASOUND FOR ASCITES TECHNIQUE: Limited ultrasound survey for  ascites was performed in all four abdominal quadrants. COMPARISON:  None. FINDINGS: Moderate ascites is seen in all 4 abdominal quadrants. IMPRESSION: Moderate ascites visualized in all 4 abdominal quadrants. Electronically Signed   By: Earle Gell M.D.   On: 08/22/2015 16:23   US Paracentesis  08/22/2015  CLINICAL DATA:  Cirrhosis, ascites, abdominal swelling EXAM: ULTRASOUND GUIDED DIAGNOSTIC AND THERAPEUTIC PARACENTESIS COMPARISON:  None PROCEDURE: Procedure, benefits, and risks of procedure were discussed with patient. Written informed consent for procedure was obtained. Time out protocol followed. Adequate collection of ascites localized by ultrasound in LEFT lower quadrant. Skin prepped and draped in usual sterile fashion. Skin and soft tissues anesthetized with 10 mL of 1% lidocaine. 5 [place] Yueh catheter placed into peritoneal cavity. 2300 mL of yellow ascites aspirated by vacuum bottle suction. Procedure tolerated well by patient without immediate complication. COMPLICATIONS: None. FINDINGS: A total of approximately 2300 mL of ascitic fluid was removed. A fluid sample of 180 mL was sent for laboratory analysis. IMPRESSION: Successful ultrasound guided paracentesis yielding 2300 mL of ascites. Electronically Signed   By: Lavonia Dana M.D.   On: 08/22/2015 17:29    Scheduled Meds: . albumin human  50 g Intravenous 3 times per day  . citalopram  20 mg Oral Daily  . cyclobenzaprine  5 mg Oral QHS  . furosemide  20 mg Intravenous 3 times per day  . glipiZIDE  10 mg Oral QAC breakfast  . insulin aspart  0-20 Units Subcutaneous TID WC  . insulin aspart  0-5 Units Subcutaneous QHS  . insulin glargine  10 Units Subcutaneous QHS  . lactulose  10 g Oral Daily  . magnesium oxide  400 mg Oral BID  . pantoprazole  40 mg Oral Daily  . [START ON 08/24/2015] pneumococcal 23 valent vaccine  0.5 mL Intramuscular Tomorrow-1000  . rifaximin  550 mg Oral BID  . sodium chloride  3 mL Intravenous Q12H  .  spironolactone  50 mg Oral Daily   Continuous Infusions:   Active Problems:   Diabetes ([place])   Chronic hepatitis C with cirrhosis (HCC)   Ascites   Thrombocytopenia (HCC)   Anasarca   Chronic liver disease and cirrhosis (HCC)   Renal insufficiency    Time spent: 25 minutes   Jehanzeb Memon. MD  [facility] Pager [phone]. If 7PM-7AM, please contact night-coverage at [email or web address], password [facility] 08/23/2015, 6:59 PM  LOS: 1 day

## 2015-08-23 NOTE — Consult Note (Signed)
   [facility] CM Inpatient Consult   08/23/2015  EGHOSA PHETTEPLACE 1961/02/15 OS:[id number]  Patient is currently active with [facility] for chronic disease management services.  Patient has been engaged by a [employer].  Our community based plan of care has focused on disease management and community resource support.  Patient will receive a post discharge transition of care call and will be evaluated for monthly home visits for assessments and disease process education.   Made Inpatient Case Manager aware that [facility] following.  Of note, [facility] Care Management services does not replace or interfere with any services that are arranged by inpatient case management or social work.   For additional questions or referrals please contact:  Royetta Crochet. [name], RN, BSN, [facility] Liaison [phone]

## 2015-08-23 NOTE — Patient Outreach (Signed)
08/23/15- Pt admitted to [facility] on 08/22/15 for massive ascites, left sided pain, paracentesis completed.  Noted in epic- home health RN had contacted primary MD for increased edema (pt has now gained 24 pounds in 2 weeks), lasix was increased and appointment made for pt to see primary MD on 08/17/15, per Dr. Ammie Ferrier office, pt called in and stated he felt better and cancelled his appointment.  RN CM spoke with [name] with [facility] and informed her pt hospitalized.  [place] RN is aware of admission.  [name] [facility], [place] Coordinator [phone]

## 2015-08-23 NOTE — Care Management Note (Signed)
Case Management Note  Patient Details  Name: [name] MRN: [id number] Date of Birth: Sep 10, 1960  Subjective/Objective:                  Pt admitted from home with ascites and SOB. Pt lives with his wife and will return home at discharge. Pt has a walker for home use. Pt requires assistance with ADl's.  Action/Plan: Pt would benefit from palliative care consult. Pt will also benefit from [facility] RN at discharge.   Expected Discharge Date:                  Expected Discharge Plan:  [place]  In-House Referral:  NA  Discharge planning Services  CM Consult  Post Acute Care Choice:  Home Health Choice offered to:  Patient  DME Arranged:    DME Agency:     HH Arranged:  RN [place] Agency:  [place]  Status of Service:  In process, will continue to follow  Medicare Important Message Given:    Date Medicare IM Given:    Medicare IM give by:    Date Additional Medicare IM Given:    Additional Medicare Important Message give by:     If discussed at [place] of Stay Meetings, dates discussed:    Additional Comments:  Joylene Draft, RN 08/23/2015, 3:11 PM

## 2015-08-24 DIAGNOSIS — D696 Thrombocytopenia, unspecified: Secondary | ICD-10-CM

## 2015-08-24 DIAGNOSIS — N183 Chronic kidney disease, stage 3 unspecified: Secondary | ICD-10-CM | POA: Diagnosis present

## 2015-08-24 DIAGNOSIS — R17 Unspecified jaundice: Secondary | ICD-10-CM | POA: Insufficient documentation

## 2015-08-24 DIAGNOSIS — K7031 Alcoholic cirrhosis of liver with ascites: Secondary | ICD-10-CM | POA: Diagnosis not present

## 2015-08-24 DIAGNOSIS — E119 Type 2 diabetes mellitus without complications: Secondary | ICD-10-CM

## 2015-08-24 DIAGNOSIS — N179 Acute kidney failure, unspecified: Secondary | ICD-10-CM | POA: Diagnosis present

## 2015-08-24 DIAGNOSIS — K769 Liver disease, unspecified: Secondary | ICD-10-CM

## 2015-08-24 DIAGNOSIS — R1084 Generalized abdominal pain: Secondary | ICD-10-CM | POA: Insufficient documentation

## 2015-08-24 DIAGNOSIS — R188 Other ascites: Secondary | ICD-10-CM

## 2015-08-24 DIAGNOSIS — Z794 Long term (current) use of insulin: Secondary | ICD-10-CM

## 2015-08-24 LAB — GLUCOSE, CAPILLARY
GLUCOSE-CAPILLARY: 83 mg/dL (ref 65–99)
GLUCOSE-CAPILLARY: 126 mg/dL — AB (ref 65–99)
Glucose-Capillary: 86 mg/dL (ref 65–99)
Glucose-Capillary: 75 mg/dL (ref 65–99)

## 2015-08-24 LAB — BASIC METABOLIC PANEL
Anion gap: 8 (ref 5–15)
BUN: 55 mg/dL — AB (ref 6–20)
CHLORIDE: 102 mmol/L (ref 101–111)
CO2: 24 mmol/L (ref 22–32)
Calcium: 8.3 mg/dL — ABNORMAL LOW (ref 8.9–10.3)
Creatinine, Ser: 2.25 mg/dL — ABNORMAL HIGH (ref 0.61–1.24)
GFR calc non Af Amer: 32 mL/min — ABNORMAL LOW (ref >60)
GFR, EST AFRICAN AMERICAN: 37 mL/min — AB (ref >60)
Glucose, Bld: 89 mg/dL (ref 65–99)
POTASSIUM: 5 mmol/L (ref 3.5–5.1)
SODIUM: 134 mmol/L — AB (ref 135–145)

## 2015-08-24 NOTE — Care Management (Signed)
Spoke with F[facility] at [facility] patient is open to [facility] for home health.

## 2015-08-24 NOTE — Progress Notes (Signed)
REVIEWED-NO ADDITIONAL RECOMMENDATIONS.   Subjective: Today he states he's been tolerating his diet well. Is disappointed that his weight has increased 2 lb this morning. Is agreeable to the fluid restriction. Abdominal swelling seems better to him. Breathing is good. Minimal abdominal pain, much better than on admission. No other complaints.  Objective: Vital signs in last 24 hours: Temp:  [97.8 F (36.6 C)-98.1 F (36.7 C)] 98 F (36.7 C) (01/13 [id number]) Pulse Rate:  [83-93] 93 (01/13 0632) Resp:  [19-20] 20 (01/13 [id number]) BP: (130-154)/(61-79) 154/71 mmHg (01/13 0632) SpO2:  [100 %] 100 % (01/13 [id number]) Weight:  [320 lb 8 oz (145.378 kg)] 320 lb 8 oz (145.378 kg) (01/13 [id number]) Last BM Date: 08/21/15 General:   Morbidly obese. Alert and oriented, pleasant Head:  Normocephalic and atraumatic. Eyes:  No icterus, sclera clear. Conjuctiva pink.  Mouth:  Without lesions, mucosa pink and moist.  Neck:  Supple, without thyromegaly or masses.  Heart:  S1, S2 present, no murmurs noted.  Lungs: Clear to auscultation bilaterally, without wheezing, rales, or rhonchi.  Abdomen:  Bowel sounds present, obese, soft in the lower abdomen with some increased firmness upper abdomen. Non-tender. No rebound or guarding. Extremities:  Without clubbing, bilateral 2+ pitting edema. Neurologic:  Alert and  oriented x4;  grossly normal neurologically. Psych:  Alert and cooperative. Normal mood and affect.  Intake/Output from previous day: 01/12 0701 - 01/13 0700 In: 1706 [P.O.:1440; I.V.:6; IV Piggyback:260] Out: 1950 [Urine:1950] Intake/Output this shift: Total I/O In: 240 [P.O.:240] Out: 75 [Urine:75]  Lab Results:  Recent Labs  08/22/15 1729 08/23/15 0631  WBC 5.9 4.3  HGB 10.2* 9.4*  HCT 30.5* 28.0*  PLT 61* 58*   BMET  Recent Labs  08/22/15 1729 08/23/15 0631 08/24/15 0722  NA 137 136 134*  K 5.4* 5.0 5.0  CL 107 105 102  CO2 25 24 24   GLUCOSE 222* 140* 89  BUN 49* 49* 55*   CREATININE 1.85* 1.80* 2.25*  CALCIUM 7.9* 7.9* 8.3*   LFT  Recent Labs  08/22/15 1729 08/23/15 0631  PROT 6.0* 5.8*  ALBUMIN 2.1* 2.1*  AST 117* 113*  ALT 51 49  ALKPHOS 120 106  BILITOT 4.4* 4.7*   PT/INR  Recent Labs  08/22/15 1729 08/23/15 0631  LABPROT 16.3* 16.8*  INR 1.30 1.35   Hepatitis Panel No results for input(s): HEPBSAG, HCVAB, HEPAIGM, HEPBIGM in the last 72 hours.   Studies/Results: Dg Chest 2 View  08/22/2015  CLINICAL DATA:  Productive cough and shortness of breath for 2 weeks. COPD. Cirrhosis. EXAM: CHEST  2 VIEW COMPARISON:  07/02/2015 FINDINGS: The heart size and mediastinal contours are within normal limits. Both lungs are clear. The visualized skeletal structures are unremarkable. IMPRESSION: No active cardiopulmonary disease. Electronically Signed   By: Earle Gell M.D.   On: 08/22/2015 17:51   US Abdomen Limited  08/22/2015  CLINICAL DATA:  Increased abdominal distention. Ascites. Cirrhosis. EXAM: LIMITED ABDOMEN ULTRASOUND FOR ASCITES TECHNIQUE: Limited ultrasound survey for ascites was performed in all four abdominal quadrants. COMPARISON:  None. FINDINGS: Moderate ascites is seen in all 4 abdominal quadrants. IMPRESSION: Moderate ascites visualized in all 4 abdominal quadrants. Electronically Signed   By: Earle Gell M.D.   On: 08/22/2015 16:23   US Paracentesis  08/22/2015  CLINICAL DATA:  Cirrhosis, ascites, abdominal swelling EXAM: ULTRASOUND GUIDED DIAGNOSTIC AND THERAPEUTIC PARACENTESIS COMPARISON:  None PROCEDURE: Procedure, benefits, and risks of procedure were discussed with patient. Written informed consent for procedure was obtained. Time  out protocol followed. Adequate collection of ascites localized by ultrasound in LEFT lower quadrant. Skin prepped and draped in usual sterile fashion. Skin and soft tissues anesthetized with 10 mL of 1% lidocaine. 5 [place] Yueh catheter placed into peritoneal cavity. 2300 mL of yellow ascites aspirated by  vacuum bottle suction. Procedure tolerated well by patient without immediate complication. COMPLICATIONS: None. FINDINGS: A total of approximately 2300 mL of ascitic fluid was removed. A fluid sample of 180 mL was sent for laboratory analysis. IMPRESSION: Successful ultrasound guided paracentesis yielding 2300 mL of ascites. Electronically Signed   By: Lavonia Dana M.D.   On: 08/22/2015 17:29    Assessment: 55 y/o male with decompensated cirrhosis (etoh/chronic HCV genotype 3) who presented with anasarca. Weight up over 20 pounds since last hospitalization. Overall his lower extremities were less swollen on admission from previous hospitalization where his weight was up more than 30 pounds than right now. He has had increased ascites with difficulty breathing. LVAP (1st ever) 08/22/15 with 2L removed. Fluid analysis negative albumin, protein, glucose. Gram stain with no organisms seen, rare WBCs seen. Preliminary culture with no growth for 2 days. Abdominal distention and breathing feeling better as of yesterday afternoon..   Current MELD 23 on admission. Child Class C. Patient previously deemed by [facility] not a transplant candidate due to obesity and multiple comorbidities.   BUN/Cre elevated and appeared intravascularly depleted yesterday on lasix 100mg  IV q12 and aldactone 50mg  daily. Had put out 1 liter of fluid since admission, less than 24 hours. His lasix was discontinued and he was started on runs of IV albumin 50g followed by 20 mg lasix which was scheduled for 3 doses to attempt to pull third-spaced fluid back into the vasculature. Noted to consider second set of Albumin/Lasix runs if tolerated well.  Likely non-compliant with 2g na diet. Labs today: Na and K+ normal. Calcium a little low at 7.9. BUN/Cr stable from yesterday, still above baseline. AST mildly elevated at 113 but improved over values from last year, albumin low but stable over the past year. CBC is stable at this time, particularly H/H  and plt. PT/INR stable. Past 24 hour I/O: UOP 1.9L but intake 1.7L for a -200 mL balance. Has been placed on a 1L daily fluid restriction by hospitalist.  With today's labs, MELD: 22, Child Pugh: C  Seems clinically improved today. Need to improve on I/O balance. Creatinine remains elevated, nephrology has been consulted.   Plan: 1. Continue supportive measures 2. Agree with fluid restriction 3. May benefit from a second round of IV albumin with lasix chaser x 3. Would be interested in nephrology input on this 4. May ultimately need another tap before discharge if fluid re-accumulates 5. Consult dietician to begin education on 2g Na diet   Walden Field, AGNP-C Adult & Gerontological Nurse Practitioner [facility]    LOS: 2 days    08/24/2015, 12:46 PM

## 2015-08-24 NOTE — Progress Notes (Signed)
[facility] PROGRESS NOTE  [name] [id number] DOB: 04-11-1961 DOA: 08/22/2015 PCP: [name], MD  Assessment/Plan: 1. Decompensated cirrhosis with Ascites, s/p paracentesis with removal of 2355ml yellow fluid. He has been seen by GI and started on intravenous lasix with albumin infusions. UOP yesterday was 1.9L, unfortunately fluid intake was 1.7L. Will need to place on fluid restriction and continue to monitor volume status and urine output. 2. AKI superimposed on CKD stage 3, likely related to diuretics. Creatinine is increased since admission. He is receiving IV lasix and has evidence of volume overload. Will continue to monitor and consult nephrology for assistance. 3. DM type 2, stable. Continue SSI, basal insulin and glipizide 4. Thrombocytopenia, chronic, related to liver disease.  Code Status: Full DVT prophylaxis: SCDs Family Communication: no family present Disposition Plan: discharge home once improved   Consultants:  Gastroenterology  Procedures:  Paracentesis 1/11 with removal of 2.3L   Antibiotics:    HPI/Subjective: Feels all right. States he drinks a lot of water and Sprite. Abdomen is less distended but still sore.   Objective: Filed Vitals:   08/23/15 2130 08/24/15 0632  BP: 143/79 154/71  Pulse: 83 93  Temp: 97.8 F (36.6 C) 98 F (36.7 C)  Resp: 20 20    Intake/Output Summary (Last 24 hours) at 08/24/15 0751 Last data filed at 08/24/15 [id number]  Gross per 24 hour  Intake   1706 ml  Output   1950 ml  Net   -244 ml   Filed Weights   08/23/15 0653 08/23/15 0929 08/24/15 [id number]  Weight: 142.475 kg (314 lb 1.6 oz) 144.244 kg (318 lb) 145.378 kg (320 lb 8 oz)    Exam:  General: NAD, looks comfortable Cardiovascular: RRR, S1, S2  Respiratory: clear bilaterally, No wheezing, rales or rhonchi Abdomen: soft, distended without significant tenderness, BS+ Musculoskeletal: 1/2+ edema b/l   Data Reviewed: Basic Metabolic  Panel:  Recent Labs Lab 08/22/15 1729 08/23/15 0631 08/24/15 0722  NA 137 136 134*  K 5.4* 5.0 5.0  CL 107 105 102  CO2 25 24 24   GLUCOSE 222* 140* 89  BUN 49* 49* 55*  CREATININE 1.85* 1.80* 2.25*  CALCIUM 7.9* 7.9* 8.3*   Liver Function Tests:  Recent Labs Lab 08/22/15 1729 08/23/15 0631  AST 117* 113*  ALT 51 49  ALKPHOS 120 106  BILITOT 4.4* 4.7*  PROT 6.0* 5.8*  ALBUMIN 2.1* 2.1*    Recent Labs Lab 08/22/15 1729  LIPASE 93*   CBC:  Recent Labs Lab 08/22/15 1729 08/23/15 0631  WBC 5.9 4.3  NEUTROABS 3.4  --   HGB 10.2* 9.4*  HCT 30.5* 28.0*  MCV 96.8 96.6  PLT 61* 58*   Cardiac Enzymes:  Recent Labs Lab 08/22/15 1729  TROPONINI 0.03   BNP (last 3 results)  Recent Labs  06/11/15 1546  BNP 89.0    CBG:  Recent Labs Lab 08/22/15 2105 08/23/15 0721 08/23/15 1055 08/23/15 1647 08/23/15 2129  GLUCAP 124* 126* 156* 159* 102*    Recent Results (from the past 240 hour(s))  Culture, body fluid-bottle     Status: None (Preliminary result)   Collection Time: 08/22/15  4:45 PM  Result Value Ref Range Status   Specimen Description ASCITIC  Final   Special Requests BOTTLES DRAWN AEROBIC AND ANAEROBIC 8CC EACH  Final   Culture NO GROWTH 2 DAYS  Final   Report Status PENDING  Incomplete  Gram stain     Status: None   Collection  Time: 08/22/15  4:45 PM  Result Value Ref Range Status   Specimen Description ASCITIC  Final   Special Requests NONE  Final   Gram Stain NO ORGANISMS SEEN RARE WBC SEEN   Final   Report Status 08/22/2015 FINAL  Final     Studies: Dg Chest 2 View  08/22/2015  CLINICAL DATA:  Productive cough and shortness of breath for 2 weeks. COPD. Cirrhosis. EXAM: CHEST  2 VIEW COMPARISON:  07/02/2015 FINDINGS: The heart size and mediastinal contours are within normal limits. Both lungs are clear. The visualized skeletal structures are unremarkable. IMPRESSION: No active cardiopulmonary disease. Electronically Signed   By:  Earle Gell M.D.   On: 08/22/2015 17:51   US Abdomen Limited  08/22/2015  CLINICAL DATA:  Increased abdominal distention. Ascites. Cirrhosis. EXAM: LIMITED ABDOMEN ULTRASOUND FOR ASCITES TECHNIQUE: Limited ultrasound survey for ascites was performed in all four abdominal quadrants. COMPARISON:  None. FINDINGS: Moderate ascites is seen in all 4 abdominal quadrants. IMPRESSION: Moderate ascites visualized in all 4 abdominal quadrants. Electronically Signed   By: Earle Gell M.D.   On: 08/22/2015 16:23   US Paracentesis  08/22/2015  CLINICAL DATA:  Cirrhosis, ascites, abdominal swelling EXAM: ULTRASOUND GUIDED DIAGNOSTIC AND THERAPEUTIC PARACENTESIS COMPARISON:  None PROCEDURE: Procedure, benefits, and risks of procedure were discussed with patient. Written informed consent for procedure was obtained. Time out protocol followed. Adequate collection of ascites localized by ultrasound in LEFT lower quadrant. Skin prepped and draped in usual sterile fashion. Skin and soft tissues anesthetized with 10 mL of 1% lidocaine. 5 [place] Yueh catheter placed into peritoneal cavity. 2300 mL of yellow ascites aspirated by vacuum bottle suction. Procedure tolerated well by patient without immediate complication. COMPLICATIONS: None. FINDINGS: A total of approximately 2300 mL of ascitic fluid was removed. A fluid sample of 180 mL was sent for laboratory analysis. IMPRESSION: Successful ultrasound guided paracentesis yielding 2300 mL of ascites. Electronically Signed   By: Lavonia Dana M.D.   On: 08/22/2015 17:29    Scheduled Meds: . citalopram  20 mg Oral Daily  . cyclobenzaprine  5 mg Oral QHS  . glipiZIDE  10 mg Oral QAC breakfast  . insulin aspart  0-20 Units Subcutaneous TID WC  . insulin aspart  0-5 Units Subcutaneous QHS  . insulin glargine  10 Units Subcutaneous QHS  . lactulose  10 g Oral Daily  . magnesium oxide  400 mg Oral BID  . pantoprazole  40 mg Oral Daily  . pneumococcal 23 valent vaccine  0.5 mL  Intramuscular Tomorrow-1000  . rifaximin  550 mg Oral BID  . sodium chloride  3 mL Intravenous Q12H  . spironolactone  50 mg Oral Daily   Continuous Infusions:   Active Problems:   Diabetes ([place])   Chronic hepatitis C with cirrhosis (HCC)   Ascites   Thrombocytopenia (HCC)   Anasarca   Chronic liver disease and cirrhosis (HCC)   Renal insufficiency    Time spent: 25 minutes   Chesley Veasey. MD  [facility] Pager [phone]. If 7PM-7AM, please contact night-coverage at [email or web address], password [facility] 08/24/2015, 7:51 AM  LOS: 2 days    By signing my name below, I, Rosalie Doctor, attest that this documentation has been prepared under the direction and in the presence of [facility]. MD Electronically Signed: Rosalie Doctor, Scribe. 08/24/2015 10:27am   I, Dr. Kathie Dike, personally performed the services described in this documentaiton. All medical record entries made by the scribe were at my direction and  in my presence. I have reviewed the chart and agree that the record reflects my personal performance and is accurate and complete  Kathie Dike, MD, 08/24/2015 10:56 AM

## 2015-08-24 NOTE — Care Management Important Message (Signed)
Important Message  Patient Details  Name: [name] MRN: [id number] Date of Birth: 1960-08-16   Medicare Important Message Given:  Yes    Alvie Heidelberg, RN 08/24/2015, 10:09 AM

## 2015-08-24 NOTE — Progress Notes (Signed)
Spoke with patient who is from home with spouse alert answers question appropriately., has cane and walker  At home . PCP is Dr Sabra Heck and see's Dr Oneida Alar for his liver  Disease.

## 2015-08-24 NOTE — Plan of Care (Signed)
Problem: Food- and Nutrition-Related Knowledge Deficit (NB-1.1) Goal: Nutrition education Formal process to instruct or train a patient/client in a skill or to impart knowledge to help patients/clients voluntarily manage or modify food choices and eating behavior to maintain or improve health. Outcome: Adequate for Discharge Nutrition Education Note  RD consulted for nutrition education regarding a low sodium (<2 g) diet  RD provided "Low Sodium Nutrition Therapy" handout from the Academy of Nutrition and Dietetics. Reviewed patient's dietary recall. He is indeed eating very high sodium foods.  Breakfast: Egg, bacon, milk, cereal  Lunch: Deli meat sandwich, canned soups Dinner: Ham, canned yams, biscuits Other: He eats out (fast food) 2-3x a week.    Provided examples on ways to decrease sodium intake in diet. Educated that can soups are one of the highest salt containing foods. Recommended preparing own soup if that is what he wants to eat. He says his wife did this just recently and did not use a broth. Emphasized that this is the correct way to have soup.  Another major source of sodium for him is fast food. The best option for him would be to eat out less frequently, but if he is going to eat out recommended choosing a salad or grilled/baked chicken sandwich. Discouraged intake of processed foods such as the lunch meats that he eats on his sandwiches. Patient mainly eats canned vegetables.  Encouraged fresh fruits and vegetables, however if he financially unable to do this, he should rinse his canned items off.   We spent time discussing how to read labels. Stressed to look at the serving size, and not just the sodium content. RD gave a couple examples of how to calculate sodium consumed. Gave pt goal of consuming no more than 2 g each day.   Pt admitted that he has used salt all his life and it is extremely difficult for him to avoid it. In an effort promote sustainable life behavior  changes, encouraged pt to make small steps and change only a single habit or two every few weeks.  RD discussed why it is important for patient to adhere to diet recommendations. Teach back method used.  Expect Fair compliance.  Body mass index is 51.75 kg/(m^2). Pt meets criteria for Morbidly obese based on current BMI.  Current diet order is 2 g Na, CC with 1 liter fluid restriction, patient is consuming approximately 100% of meals at this time. Labs and medications reviewed. No further nutrition interventions warranted at this time. [facility]contact information provided. If additional nutrition issues arise, please re-consult RD.   Burtis Junes RD, LDN Nutrition Pager: [phone] 08/24/2015 4:03 PM

## 2015-08-25 LAB — GLUCOSE, CAPILLARY
GLUCOSE-CAPILLARY: 113 mg/dL — AB (ref 65–99)
GLUCOSE-CAPILLARY: 84 mg/dL (ref 65–99)
Glucose-Capillary: 65 mg/dL (ref 65–99)
Glucose-Capillary: 73 mg/dL (ref 65–99)
Glucose-Capillary: 53 mg/dL — ABNORMAL LOW (ref 65–99)
Glucose-Capillary: 100 mg/dL — ABNORMAL HIGH (ref 65–99)

## 2015-08-25 LAB — BASIC METABOLIC PANEL
ANION GAP: 8 (ref 5–15)
BUN: 61 mg/dL — AB (ref 6–20)
CALCIUM: 8.5 mg/dL — AB (ref 8.9–10.3)
CO2: 24 mmol/L (ref 22–32)
CREATININE: 2.64 mg/dL — AB (ref 0.61–1.24)
Chloride: 102 mmol/L (ref 101–111)
GFR calc Af Amer: 30 mL/min — ABNORMAL LOW (ref >60)
GFR, EST NON AFRICAN AMERICAN: 26 mL/min — AB (ref >60)
GLUCOSE: 69 mg/dL (ref 65–99)
Potassium: 5.2 mmol/L — ABNORMAL HIGH (ref 3.5–5.1)
Sodium: 134 mmol/L — ABNORMAL LOW (ref 135–145)

## 2015-08-25 LAB — SODIUM, URINE, RANDOM

## 2015-08-25 LAB — CREATININE, URINE, RANDOM: CREATININE, URINE: 125.49 mg/dL

## 2015-08-25 MED ORDER — FUROSEMIDE 10 MG/ML IJ SOLN
80.0000 mg | Freq: Two times a day (BID) | INTRAMUSCULAR | Status: DC
  Administered 2015-08-25 – 2015-08-27 (×6): 80 mg via INTRAVENOUS
  Filled 2015-08-25 (×7): qty 8

## 2015-08-25 NOTE — Progress Notes (Signed)
[facility] PROGRESS NOTE  [name] [id number] DOB: Mar 12, 1961 DOA: 08/22/2015 PCP: [name], MD  Assessment/Plan: 1. Decompensated cirrhosis with Ascites, s/p paracentesis with removal of 2369ml yellow fluid. Overall ascites has improved, will continue to monitor.    2. AKI superimposed on CKD stage 3, likely related to diuretics. Creatinine continues to increase, noted to be 2.64 today. Nephrology has consulted and recommended to increase lasix  To 80mg  and and holding aldactone. May need albumin infusions to aid in diuresis.  3. DM type 2, stable. Patient had episodes of hypoglycemia this morning, glipizide has been discontinued. Continue SSI. 4. Thrombocytopenia, chronic, related to liver disease. 5. Hyperkalemia, likely related to aldactone which has now been held. Repeat labs in the am.  Code Status: Full DVT prophylaxis: SCDs Family Communication: no family present Disposition Plan: Anticipate discharge within 2-3 days.    Consultants:  Gastroenterology  Nephrology   Procedures:  Paracentesis 1/11 with removal of 2.3L   Antibiotics:  None   HPI/Subjective: Complains of being a little sore but swelling is improving. Doesn't think he has much urine output  Objective: Filed Vitals:   08/24/15 2057 08/25/15 0500  BP: 158/66 161/63  Pulse: 96 83  Temp: 99 F (37.2 C) 96.3 F (35.7 C)  Resp: 20 20    Intake/Output Summary (Last 24 hours) at 08/25/15 0654 Last data filed at 08/24/15 2059  Gross per 24 hour  Intake    720 ml  Output    375 ml  Net    345 ml   Filed Weights   08/23/15 0653 08/23/15 0929 08/24/15 [id number]  Weight: 142.475 kg (314 lb 1.6 oz) 144.244 kg (318 lb) 145.378 kg (320 lb 8 oz)    Exam:  General: NAD, looks comfortable Cardiovascular: RRR, S1, S2  Respiratory: clear bilaterally, No wheezing, rales or rhonchi Abdomen: soft, non tender, mild distention , bowel sounds normal Musculoskeletal: 2+ edema b/l   Data  Reviewed: Basic Metabolic Panel:  Recent Labs Lab 08/22/15 1729 08/23/15 0631 08/24/15 0722  NA 137 136 134*  K 5.4* 5.0 5.0  CL 107 105 102  CO2 25 24 24   GLUCOSE 222* 140* 89  BUN 49* 49* 55*  CREATININE 1.85* 1.80* 2.25*  CALCIUM 7.9* 7.9* 8.3*   Liver Function Tests:  Recent Labs Lab 08/22/15 1729 08/23/15 0631  AST 117* 113*  ALT 51 49  ALKPHOS 120 106  BILITOT 4.4* 4.7*  PROT 6.0* 5.8*  ALBUMIN 2.1* 2.1*    Recent Labs Lab 08/22/15 1729  LIPASE 93*   CBC:  Recent Labs Lab 08/22/15 1729 08/23/15 0631  WBC 5.9 4.3  NEUTROABS 3.4  --   HGB 10.2* 9.4*  HCT 30.5* 28.0*  MCV 96.8 96.6  PLT 61* 58*   Cardiac Enzymes:  Recent Labs Lab 08/22/15 1729  TROPONINI 0.03   BNP (last 3 results)  Recent Labs  06/11/15 1546  BNP 89.0    CBG:  Recent Labs Lab 08/23/15 2129 08/24/15 0757 08/24/15 1127 08/24/15 1648 08/24/15 2102  GLUCAP 102* 83 86 126* 75    Recent Results (from the past 240 hour(s))  Culture, body fluid-bottle     Status: None (Preliminary result)   Collection Time: 08/22/15  4:45 PM  Result Value Ref Range Status   Specimen Description ASCITIC  Final   Special Requests BOTTLES DRAWN AEROBIC AND ANAEROBIC 8CC EACH  Final   Culture NO GROWTH 2 DAYS  Final   Report Status PENDING  Incomplete  Gram stain     Status: None   Collection Time: 08/22/15  4:45 PM  Result Value Ref Range Status   Specimen Description ASCITIC  Final   Special Requests NONE  Final   Gram Stain NO ORGANISMS SEEN RARE WBC SEEN   Final   Report Status 08/22/2015 FINAL  Final     Studies: No results found.  Scheduled Meds: . citalopram  20 mg Oral Daily  . cyclobenzaprine  5 mg Oral QHS  . glipiZIDE  10 mg Oral QAC breakfast  . insulin aspart  0-20 Units Subcutaneous TID WC  . insulin aspart  0-5 Units Subcutaneous QHS  . insulin glargine  10 Units Subcutaneous QHS  . lactulose  10 g Oral Daily  . magnesium oxide  400 mg Oral BID  .  pantoprazole  40 mg Oral Daily  . rifaximin  550 mg Oral BID  . sodium chloride  3 mL Intravenous Q12H  . spironolactone  50 mg Oral Daily   Continuous Infusions:   Active Problems:   Diabetes ([place])   Chronic hepatitis C with cirrhosis (HCC)   Ascites   Thrombocytopenia (HCC)   Anasarca   Chronic liver disease and cirrhosis (HCC)   Renal insufficiency   Acute renal failure superimposed on stage 3 chronic kidney disease (HCC)   Elevated bilirubin   Generalized abdominal pain    Time spent: 25 minutes   Alysiana Ethridge. MD  [facility] Pager [phone]. If 7PM-7AM, please contact night-coverage at [email or web address], password [facility] 08/25/2015, 6:54 AM  LOS: 3 days     By signing my name below, I, Rennis Harding, attest that this documentation has been prepared under the direction and in the presence of Kathie Dike, MD. Electronically signed: Rennis Harding, Scribe. 08/25/2015 1:35pm    I, Dr. Kathie Dike, personally performed the services described in this documentaiton. All medical record entries made by the scribe were at my direction and in my presence. I have reviewed the chart and agree that the record reflects my personal performance and is accurate and complete  Kathie Dike, MD, 08/25/2015 1:47 PM

## 2015-08-25 NOTE — Progress Notes (Signed)
Hypoglycemic Event  CBG: 65  Treatment: 15 GM carbohydrate snack  Symptoms: None  Follow-up CBG: Time:0900 CBG Result:73  Possible Reasons for Event: Medication regimen: SSI and glipizide  Comments/MD notified:Dr. Roderic Palau paged and made aware    Clarice Pole

## 2015-08-25 NOTE — Progress Notes (Signed)
  Assessment/Plan: ADMITTED WITH ANASARCA-CLINICALLY IMPROVED, MENTAL STATUS BASELINE. Cr CLIMBING.  PLAN: 1. SUPPORTIVE CARE 2. D/C FLUID RESTRICTION. PT MAY DRINK FLUID TO ALLEVIATE THIRST. 3. CONTINUE LACTULOSE AND XIFAXIN.   Subjective: Since I last evaluated the patient HE IS EATING OK. NOGGIN FEELS GOOD. NO DIARRHEA, ANUSEA, OR VOMITING. SORE IN RIGHT SIDE. ONE BM YESTERDAY. SLEPT WELL LAST NIGHT.  Objective: Vital signs in last 24 hours: Filed Vitals:   08/24/15 2057 08/25/15 0500  BP: 158/66 161/63  Pulse: 96 83  Temp: 99 F (37.2 C) 96.3 F (35.7 C)  Resp: 20 20     General appearance: alert, cooperative and no distress Resp: clear to auscultation bilaterally Cardio: regular rate and rhythm GI: soft, non-tender; bowel sounds normal; no masses,  no organomegaly  Lab Results:  BMP Latest Ref Rng 08/25/2015 08/24/2015 08/23/2015  Glucose 65 - 99 mg/dL 69 89 140(H)  BUN 6 - 20 mg/dL 61(H) 55(H) 49(H)  Creatinine 0.61 - 1.24 mg/dL 2.64(H) 2.25(H) 1.80(H)  BUN/Creat Ratio 9 - 20 - - -  Sodium 135 - 145 mmol/L 134(L) 134(L) 136  Potassium 3.5 - 5.1 mmol/L 5.2(H) 5.0 5.0  Chloride 101 - 111 mmol/L 102 102 105  CO2 22 - 32 mmol/L 24 24 24   Calcium 8.9 - 10.3 mg/dL 8.5(L) 8.3(L) 7.9(L)    CBC    Component Value Date/Time   WBC 4.3 08/23/2015 0631   WBC 3.7 04/10/2015 1609   RBC 2.90* 08/23/2015 0631   RBC 3.71* 04/10/2015 1609   HGB 9.4* 08/23/2015 0631   HCT 28.0* 08/23/2015 0631   HCT 35.7* 04/10/2015 1609   PLT 58* 08/23/2015 0631   PLT 57* 04/10/2015 1609   MCV 96.6 08/23/2015 0631   MCV 96 04/10/2015 1609   MCH 32.4 08/23/2015 0631   MCH 32.6 04/10/2015 1609   MCHC 33.6 08/23/2015 0631   MCHC 33.9 04/10/2015 1609   RDW 16.6* 08/23/2015 0631   RDW 13.8 04/10/2015 1609   LYMPHSABS 1.4 08/22/2015 1729   LYMPHSABS 1.0 04/10/2015 1609   MONOABS 1.0 08/22/2015 1729   EOSABS 0.1 08/22/2015 1729   EOSABS 0.1 04/10/2015 1609   BASOSABS 0.0 08/22/2015 1729    BASOSABS 0.0 04/10/2015 1609       Studies/Results: No results found.  Medications: I have reviewed the patient's current medications.   LOS: 5 days   Barney Drain 01/19/2014, 2:23 PM

## 2015-08-25 NOTE — Consult Note (Signed)
Reason for Consult: Acute kidney injury superimposed on chronic Referring Physician: Dr. [name] is an 55 y.o. male.  HPI: He is a patient with history of hypertension, diabetes, hepatitis C infection, liver cirrhosis with ascites presently came with complaints of increased swelling, difficulty breathing, itching for the last 2-3 weeks. Presently patient denies any nausea or vomiting. He has stage III chronic renal failure and patient was here in November for similar problems. Patient had recent paracentesis because of severe ascites. His main complaint at this moment seems to be increased swelling. His appetite is good.  Past Medical History  Diagnosis Date  . Hypertension   . Diabetes mellitus   . GERD (gastroesophageal reflux disease)     DEC 2010 EGD/Bx REACTIVE GASTROPATHY, NO VARICES  . Hemorrhoids, internal   . BMI 40.0-44.9, adult ([place]) OCT 2010 269 LBS    APR 2012 279 LBS AUG 2014 185 LBS  . Cirrhosis ([place]) NOV 2010 CHILD PUGH A    ETOH/HCV/OBESITY  . IV drug abuse REMOTE  . Hepatitis 2010 HEP C    AST 509 ALT 267 ALK PHOS 165 ALB 3.8 NEG IGM HAV/HBSAg.   . Gallstone AUG 2012 1 CM  . GERD 10/25/2009  . COPD (chronic obstructive pulmonary disease) ([place])   . Hepatitis C     failed interferon/ribavirin. treated with ribavirin/sofosbuvir/declastasvir 2016, ?failure or early relapse unable to determine because patient was noncompliant.  . Pancytopenia ([place]) 2013  . Splenomegaly 2013  . Other pancytopenia ([place]) 02/18/2013  . Iron (Fe) deficiency anemia 02/18/2013  . Splenomegaly 02/18/2013  . Neuropathy ([place])   . Biliary dyskinesia JUL 2015    HIDA GB EF 5%  . Chronic pain   . Chronic leg pain     left  . Portal venous hypertension ([facility])   . Asthma   . Medically noncompliant 05/30/2015  . Renal insufficiency     Past Surgical History  Procedure Laterality Date  . Sigmoidoscopy      2001 DR. FLEISCHMAN INTERNAL HERMORRHOIDS  . Upper gastrointestinal endoscopy   DEC 2010    BENIGN POLYPS, GASTRITIS, ?phg  . Knee surgery  RIGHT  . Hemorrhoid surgery    . Esophageal biopsy  09/08/2011    [name]:Moderate gastritis/Polyps, multiple in the body of the stomach  . Colonoscopy with propofol N/A 11/15/2013    [name]: rectal varices, small AVMs  . Esophagogastroduodenoscopy (egd) with propofol N/A 11/15/2013    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 Next EGD in April 2016.      Family History  Problem Relation Age of Onset  . Colon cancer Neg Hx   . Anesthesia problems Neg Hx   . Hypotension Neg Hx   . Malignant hyperthermia Neg Hx   . Pseudochol deficiency Neg Hx   . Cancer Father     Social History:  reports that he has never smoked. He has never used smokeless tobacco. He reports that he does not drink alcohol or use illicit drugs.  Allergies:  Allergies  Allergen Reactions  . Penicillins Hives    Has patient had a PCN reaction causing immediate rash, facial/tongue/throat swelling, SOB or lightheadedness with hypotension: Yes Has patient had a PCN reaction causing severe rash involving mucus membranes or skin necrosis: Yes Has patient had a PCN reaction that required hospitalization No Has patient had a PCN reaction occurring within the last 10 years: No If all of the  above answers are "NO", then may proceed with Cephalosporin use.     Medications: I have reviewed the patient's current medications.  Results for orders placed or performed during the hospital encounter of 08/22/15 (from the past 48 hour(s))  Glucose, capillary     Status: Abnormal   Collection Time: 08/23/15 10:55 AM  Result Value Ref Range   Glucose-Capillary 156 (H) 65 - 99 mg/dL   Comment 1 Notify RN   Glucose, capillary     Status: Abnormal   Collection Time: 08/23/15  4:47 PM  Result Value Ref Range   Glucose-Capillary 159 (H) 65 - 99 mg/dL   Comment 1 Notify RN   Glucose, capillary     Status:  Abnormal   Collection Time: 08/23/15  9:29 PM  Result Value Ref Range   Glucose-Capillary 102 (H) 65 - 99 mg/dL   Comment 1 Notify RN    Comment 2 Document in Chart   Basic metabolic panel     Status: Abnormal   Collection Time: 08/24/15  7:22 AM  Result Value Ref Range   Sodium 134 (L) 135 - 145 mmol/L   Potassium 5.0 3.5 - 5.1 mmol/L   Chloride 102 101 - 111 mmol/L   CO2 24 22 - 32 mmol/L   Glucose, Bld 89 65 - 99 mg/dL   BUN 55 (H) 6 - 20 mg/dL   Creatinine, Ser 2.25 (H) 0.61 - 1.24 mg/dL   Calcium 8.3 (L) 8.9 - 10.3 mg/dL   GFR calc non Af Amer 32 (L) >60 mL/min   GFR calc Af Amer 37 (L) >60 mL/min    Comment: (NOTE) The eGFR has been calculated using the CKD EPI equation. This calculation has not been validated in all clinical situations. eGFR's persistently <60 mL/min signify possible Chronic Kidney Disease.    Anion gap 8 5 - 15  Glucose, capillary     Status: None   Collection Time: 08/24/15  7:57 AM  Result Value Ref Range   Glucose-Capillary 83 65 - 99 mg/dL  Glucose, capillary     Status: None   Collection Time: 08/24/15 11:27 AM  Result Value Ref Range   Glucose-Capillary 86 65 - 99 mg/dL  Glucose, capillary     Status: Abnormal   Collection Time: 08/24/15  4:48 PM  Result Value Ref Range   Glucose-Capillary 126 (H) 65 - 99 mg/dL  Glucose, capillary     Status: None   Collection Time: 08/24/15  9:02 PM  Result Value Ref Range   Glucose-Capillary 75 65 - 99 mg/dL   Comment 1 Notify RN    Comment 2 Document in Chart   Basic metabolic panel     Status: Abnormal   Collection Time: 08/25/15  6:47 AM  Result Value Ref Range   Sodium 134 (L) 135 - 145 mmol/L   Potassium 5.2 (H) 3.5 - 5.1 mmol/L   Chloride 102 101 - 111 mmol/L   CO2 24 22 - 32 mmol/L   Glucose, Bld 69 65 - 99 mg/dL   BUN 61 (H) 6 - 20 mg/dL   Creatinine, Ser 2.64 (H) 0.61 - 1.24 mg/dL   Calcium 8.5 (L) 8.9 - 10.3 mg/dL   GFR calc non Af Amer 26 (L) >60 mL/min   GFR calc Af Amer 30 (L) >60  mL/min    Comment: (NOTE) The eGFR has been calculated using the CKD EPI equation. This calculation has not been validated in all clinical situations. eGFR's persistently <60 mL/min signify  possible Chronic Kidney Disease.    Anion gap 8 5 - 15  Glucose, capillary     Status: None   Collection Time: 08/25/15  7:30 AM  Result Value Ref Range   Glucose-Capillary 65 65 - 99 mg/dL   Comment 1 Notify RN    Comment 2 Document in Chart   Glucose, capillary     Status: None   Collection Time: 08/25/15  9:09 AM  Result Value Ref Range   Glucose-Capillary 73 65 - 99 mg/dL   Comment 1 Notify RN    Comment 2 Document in Chart     No results found.  Review of Systems  Constitutional: Positive for chills. Negative for fever.  Respiratory: Positive for sputum production and shortness of breath.   Cardiovascular: Positive for leg swelling.  Gastrointestinal: Positive for abdominal pain. Negative for nausea and vomiting.  Musculoskeletal: Positive for back pain.  Neurological: Positive for weakness.   Blood pressure 161/63, pulse 83, temperature 96.3 F (35.7 C), temperature source Oral, resp. rate 20, height _0  (1.676 m), weight 323 lb 14.4 oz (146.92 kg), SpO2 100 %. Physical Exam  Constitutional: He is oriented to person, place, and time. No distress.  Eyes: No scleral icterus.  Neck: No JVD present.  Cardiovascular: Normal rate and regular rhythm.   Respiratory: He has wheezes. He has no rales.  GI: He exhibits distension. There is no tenderness. There is no rebound.  Musculoskeletal: He exhibits edema.  Neurological: He is alert and oriented to person, place, and time.    Assessment/Plan: Problem #1 acute kidney injury superimposed on chronic. Presently is BUN and creatinine is increasing about his baseline. This could be secondary to prerenal versus ATN. Proble m #2 chronic renal failure: Patient's baseline creatinine about 1.28-1.5. Stage III. Could be a  Secondary to  diabetes/hypertension/ ischemic kidney disease, and recurrent acute kidney injury. Patient also with history of hepatitis C infection. Problem #3 anemia: This could be a part of pancytopenia associated with splenomegaly. At this moment and anemia of iron deficiency and chronic disease cannot be ruled out. Problem #4 anasarca: This could be secondary to liver cirrhosis and uncontrolled salt and fluid intake. Problem #5 hepatitis C infection Problem #6 diabetes Problem #7 hypertension: His blood pressure is reasonably controlled. Plan: I will check urine sodium and creatinine. 2] will hold Aldactone because of high normal potassium. 3] will start patient on Lasix 80 mg IV twice a day 4] will check compressive metabolic panel in the morning. 5] if his albumin is very low and his urine output doesn't improve we'll consider adding albumin to his  treatment.  [name] S 08/25/2015, 9:39 AM

## 2015-08-26 DIAGNOSIS — K7290 Hepatic failure, unspecified without coma: Secondary | ICD-10-CM

## 2015-08-26 LAB — COMPREHENSIVE METABOLIC PANEL
ALBUMIN: 3 g/dL — AB (ref 3.5–5.0)
ALT: 45 U/L (ref 17–63)
AST: 117 U/L — AB (ref 15–41)
Alkaline Phosphatase: 97 U/L (ref 38–126)
Anion gap: 10 (ref 5–15)
BILIRUBIN TOTAL: 4.4 mg/dL — AB (ref 0.3–1.2)
BUN: 63 mg/dL — AB (ref 6–20)
CHLORIDE: 103 mmol/L (ref 101–111)
CO2: 24 mmol/L (ref 22–32)
CREATININE: 2.51 mg/dL — AB (ref 0.61–1.24)
Calcium: 8.7 mg/dL — ABNORMAL LOW (ref 8.9–10.3)
GFR calc Af Amer: 32 mL/min — ABNORMAL LOW (ref >60)
GFR calc non Af Amer: 28 mL/min — ABNORMAL LOW (ref >60)
GLUCOSE: 95 mg/dL (ref 65–99)
POTASSIUM: 5.1 mmol/L (ref 3.5–5.1)
Sodium: 137 mmol/L (ref 135–145)
TOTAL PROTEIN: 6.2 g/dL — AB (ref 6.5–8.1)

## 2015-08-26 LAB — GLUCOSE, CAPILLARY
GLUCOSE-CAPILLARY: 90 mg/dL (ref 65–99)
GLUCOSE-CAPILLARY: 114 mg/dL — AB (ref 65–99)
GLUCOSE-CAPILLARY: 104 mg/dL — AB (ref 65–99)
Glucose-Capillary: 124 mg/dL — ABNORMAL HIGH (ref 65–99)
Glucose-Capillary: 119 mg/dL — ABNORMAL HIGH (ref 65–99)

## 2015-08-26 LAB — AMMONIA: Ammonia: 138 umol/L — ABNORMAL HIGH (ref 9–35)

## 2015-08-26 MED ORDER — METOLAZONE 5 MG PO TABS
5.0000 mg | ORAL_TABLET | Freq: Two times a day (BID) | ORAL | Status: DC
  Administered 2015-08-26 – 2015-08-29 (×7): 5 mg via ORAL
  Filled 2015-08-26 (×7): qty 1

## 2015-08-26 MED ORDER — LACTULOSE 10 GM/15ML PO SOLN
30.0000 g | Freq: Three times a day (TID) | ORAL | Status: DC
  Administered 2015-08-26 – 2015-08-28 (×6): 30 g via ORAL
  Filled 2015-08-26 (×6): qty 60

## 2015-08-26 NOTE — Progress Notes (Signed)
[facility] PROGRESS NOTE  [name] [id number] DOB: February 12, 1961 DOA: 08/22/2015 PCP: [name], MD  Assessment/Plan: 1. Decompensated cirrhosis with ascites, s/p paracentesis with removal of 2367ml yellow fluid. Overall ascites has improved, will continue to monitor.  2. Hepatic encephalopathy. Ammonia 138 today.  Will increase Lactulose and continue Xifaxin per GI. Repeat ammonia level in a.m.  3. AKI superimposed on CKD stage III, likely related to diuretics. Creatinine improved at 2.51 today. Nephrology input appreciated.  Will continue IV Lasix and he has been started on Metolazone. 4. DM type 2, stable. Patient had episodes of hypoglycemia 1/14 so glipizide has been discontinued. Continue SSI. 5. Thrombocytopenia, chronic, related to liver disease. 6. Hyperkalemia, likely related to aldactone. Resolved.   Code Status: Full DVT prophylaxis: SCDs Family Communication: No family present Disposition Plan: Anticipate discharge within 2-3 days.    Consultants:  Gastroenterology  Nephrology   Procedures:  Paracentesis 1/11 with removal of 2.3L   Antibiotics:  None   HPI/Subjective: Abdomen feels okay. Has been urinating well. Feels confused today.  Objective: Filed Vitals:   08/25/15 2033 08/26/15 0500  BP: 149/65 170/97  Pulse: 79 95  Temp: 97.8 F (36.6 C) 98.7 F (37.1 C)  Resp: 21 22    Intake/Output Summary (Last 24 hours) at 08/26/15 0755 Last data filed at 08/25/15 1800  Gross per 24 hour  Intake    720 ml  Output   1500 ml  Net   -780 ml   Filed Weights   08/24/15 0632 08/25/15 0910 08/26/15 0500  Weight: 145.378 kg (320 lb 8 oz) 146.92 kg (323 lb 14.4 oz) 145.151 kg (320 lb)    Exam:  General: NAD, looks comfortable Cardiovascular: RRR, S1, S2  Respiratory: clear bilaterally, No wheezing, rales or rhonchi Abdomen: soft, non tender, distended , bowel sounds normal Musculoskeletal: 2+ edema b/l Neurological: Patient is  confused and somnolent.   Data Reviewed: Basic Metabolic Panel:  Recent Labs Lab 08/22/15 1729 08/23/15 0631 08/24/15 0722 08/25/15 0647 08/26/15 0646  NA 137 136 134* 134* 137  K 5.4* 5.0 5.0 5.2* 5.1  CL 107 105 102 102 103  CO2 25 24 24 24 24   GLUCOSE 222* 140* 89 69 95  BUN 49* 49* 55* 61* 63*  CREATININE 1.85* 1.80* 2.25* 2.64* 2.51*  CALCIUM 7.9* 7.9* 8.3* 8.5* 8.7*   Liver Function Tests:  Recent Labs Lab 08/22/15 1729 08/23/15 0631 08/26/15 0646  AST 117* 113* 117*  ALT 51 49 45  ALKPHOS 120 106 97  BILITOT 4.4* 4.7* 4.4*  PROT 6.0* 5.8* 6.2*  ALBUMIN 2.1* 2.1* 3.0*    Recent Labs Lab 08/22/15 1729  LIPASE 93*   CBC:  Recent Labs Lab 08/22/15 1729 08/23/15 0631  WBC 5.9 4.3  NEUTROABS 3.4  --   HGB 10.2* 9.4*  HCT 30.5* 28.0*  MCV 96.8 96.6  PLT 61* 58*   Cardiac Enzymes:  Recent Labs Lab 08/22/15 1729  TROPONINI 0.03   BNP (last 3 results)  Recent Labs  06/11/15 1546  BNP 89.0    CBG:  Recent Labs Lab 08/25/15 1139 08/25/15 1636 08/25/15 1731 08/25/15 2042 08/26/15 0720  GLUCAP 113* 53* 100* 84 90    Recent Results (from the past 240 hour(s))  Culture, body fluid-bottle     Status: None (Preliminary result)   Collection Time: 08/22/15  4:45 PM  Result Value Ref Range Status   Specimen Description ASCITIC  Final   Special Requests BOTTLES DRAWN AEROBIC  AND ANAEROBIC 8CC EACH  Final   Culture NO GROWTH 3 DAYS  Final   Report Status PENDING  Incomplete  Gram stain     Status: None   Collection Time: 08/22/15  4:45 PM  Result Value Ref Range Status   Specimen Description ASCITIC  Final   Special Requests NONE  Final   Gram Stain NO ORGANISMS SEEN RARE WBC SEEN   Final   Report Status 08/22/2015 FINAL  Final     Studies: No results found.  Scheduled Meds: . citalopram  20 mg Oral Daily  . cyclobenzaprine  5 mg Oral QHS  . furosemide  80 mg Intravenous BID  . insulin aspart  0-20 Units Subcutaneous TID WC   . insulin aspart  0-5 Units Subcutaneous QHS  . lactulose  10 g Oral Daily  . magnesium oxide  400 mg Oral BID  . pantoprazole  40 mg Oral Daily  . rifaximin  550 mg Oral BID  . sodium chloride  3 mL Intravenous Q12H   Continuous Infusions:   Active Problems:   Diabetes ([place])   Chronic hepatitis C with cirrhosis ([facility])   Ascites   Thrombocytopenia ([facility])   Anasarca   Chronic liver disease and cirrhosis ([facility])   Renal insufficiency   Acute renal failure superimposed on stage 3 chronic kidney disease ([facility])   Elevated bilirubin   Generalized abdominal pain    Time spent: 25 minutes   Prynce Jacober. MD  [facility] Pager [phone]. If 7PM-7AM, please contact night-coverage at [email or web address], password [facility] 08/26/2015, 7:55 AM  LOS: 4 days     By signing my name below, I, Rosalie Doctor, attest that this documentation has been prepared under the direction and in the presence of [facility]. MD Electronically Signed: Rosalie Doctor, Scribe. 08/26/2015 1:28pm  I, Dr. Kathie Dike, personally performed the services described in this documentaiton. All medical record entries made by the scribe were at my direction and in my presence. I have reviewed the chart and agree that the record reflects my personal performance and is accurate and complete  Kathie Dike, MD, 08/26/2015 1:37 PM

## 2015-08-26 NOTE — Progress Notes (Signed)
Patient ID: [name], male   DOB: 02-17-1961, 55 y.o.   MRN: [id number]   Assessment/Plan: ADMITTED WITH ANASARCA. NOW WITH ACUTE INCREASE IN AMMONIA LEVEL. CR IMPROVED.  PLAN: 1. INCREASE LACTULOSE 2. CONTINUE XIFAXAN 3. CONTINUE TO MONITOR SYMPTOMS.  Subjective: Since I last evaluated the patient HE HAS BECOME CONFUSED.No questions or concerns.  Objective: Vital signs in last 24 hours: Filed Vitals:   08/25/15 2033 08/26/15 0500  BP: 149/65 170/97  Pulse: 79 95  Temp: 97.8 F (36.6 C) 98.7 F (37.1 C)  Resp: 21 22   General appearance: alert, cooperative and no distress Resp: clear to auscultation bilaterally Cardio: regular rate and rhythm GI: soft, non-tender; bowel sounds normal; no masses,  no organomegaly  Lab Results:  CBC Latest Ref Rng 08/23/2015 08/22/2015 07/13/2015  WBC 4.0 - 10.5 K/uL 4.3 5.9 2.1(L)  Hemoglobin 13.0 - 17.0 g/dL 9.4(L) 10.2(L) 9.3(L)  Hematocrit 39.0 - 52.0 % 28.0(L) 30.5(L) 27.6(L)  Platelets 150 - 400 K/uL 58(L) 61(L) 54(L)   Studies/Results: No results found.  Medications: I have reviewed the patient's current medications.   LOS: 5 days   Barney Drain 01/19/2014, 2:23 PM

## 2015-08-26 NOTE — Progress Notes (Signed)
Subjective: Patient complains of some dizziness and confusion this morning. Patient says that he was not able to call his wife because he was having difficulty in using the phone. Presently he feels much better. Denies any difficulty breathing.   Objective: Vital signs in last 24 hours: Temp:  [97.8 F (36.6 C)-98.9 F (37.2 C)] 98.7 F (37.1 C) (01/15 0500) Pulse Rate:  [79-95] 95 (01/15 0500) Resp:  [20-22] 22 (01/15 0500) BP: (149-170)/(65-97) 170/97 mmHg (01/15 0500) SpO2:  [99 %-100 %] 100 % (01/15 0500) Weight:  [320 lb (145.151 kg)] 320 lb (145.151 kg) (01/15 0500)  Intake/Output from previous day: 01/14 0701 - 01/15 0700 In: 720 [P.O.:720] Out: 1500 [Urine:1500] Intake/Output this shift: Total I/O In: -  Out: 125 [Urine:125]  No results for input(s): HGB in the last 72 hours. No results for input(s): WBC, RBC, HCT, PLT in the last 72 hours.  Recent Labs  08/25/15 0647 08/26/15 0646  NA 134* 137  K 5.2* 5.1  CL 102 103  CO2 24 24  BUN 61* 63*  CREATININE 2.64* 2.51*  GLUCOSE 69 95  CALCIUM 8.5* 8.7*   No results for input(s): LABPT, INR in the last 72 hours.  Generally patient is alert and in no apparent distress Chest decreased breath sound bilaterally Heart exam regular rate and rhythm. No murmur Abdomen: Full, distended but nontender Extremities he has possibly about 3+ edema  Assessment/Plan: Problem #1 acute kidney injury superimposed on chronic: His BUN and creatinine is slightly better. Problem #2 chronic renal failure: His thought to be secondary to diabetes/hypertension/recurrent AK I. He has a stage III chronic renal failure. Problem #3 anasarca: Presently he is on IV Lasix. His urine output has improved and patient has lost about 3 pounds since yesterday. Problem #4 anemia Problem #5 history of diabetes: His blood pressure is reasonably controlled Problem #6 thrombocytopenia Problem #7 history of liver cirrhosis Problem #8 obesity Problem #9  confusion: Etiology as this moment no clear. Possibly hepatic encephalopathy. Ammonia level is not available. Plan: We'll continue his present management We'll add it has on 5 mg every twice a day We'll check his basic metabolic panel in the morning We'll check ammonia level    Oakley Orban S 08/26/2015, 10:11 AM

## 2015-08-27 LAB — CULTURE, BODY FLUID W GRAM STAIN -BOTTLE

## 2015-08-27 LAB — HEPATIC FUNCTION PANEL
ALT: 45 U/L (ref 17–63)
AST: 118 U/L — ABNORMAL HIGH (ref 15–41)
Albumin: 2.9 g/dL — ABNORMAL LOW (ref 3.5–5.0)
Alkaline Phosphatase: 97 U/L (ref 38–126)
BILIRUBIN DIRECT: 2.1 mg/dL — AB (ref 0.1–0.5)
BILIRUBIN INDIRECT: 2.3 mg/dL — AB (ref 0.3–0.9)
Total Bilirubin: 4.4 mg/dL — ABNORMAL HIGH (ref 0.3–1.2)
Total Protein: 6.2 g/dL — ABNORMAL LOW (ref 6.5–8.1)

## 2015-08-27 LAB — BASIC METABOLIC PANEL
ANION GAP: 9 (ref 5–15)
BUN: 63 mg/dL — ABNORMAL HIGH (ref 6–20)
CALCIUM: 8.8 mg/dL — AB (ref 8.9–10.3)
CO2: 26 mmol/L (ref 22–32)
Chloride: 104 mmol/L (ref 101–111)
Creatinine, Ser: 2.2 mg/dL — ABNORMAL HIGH (ref 0.61–1.24)
GFR, EST AFRICAN AMERICAN: 38 mL/min — AB (ref >60)
GFR, EST NON AFRICAN AMERICAN: 32 mL/min — AB (ref >60)
Glucose, Bld: 140 mg/dL — ABNORMAL HIGH (ref 65–99)
Potassium: 4.7 mmol/L (ref 3.5–5.1)
Sodium: 139 mmol/L (ref 135–145)

## 2015-08-27 LAB — PROTIME-INR
INR: 1.31 (ref 0.00–1.49)
Prothrombin Time: 16.4 seconds — ABNORMAL HIGH (ref 11.6–15.2)

## 2015-08-27 LAB — GLUCOSE, CAPILLARY
GLUCOSE-CAPILLARY: 122 mg/dL — AB (ref 65–99)
GLUCOSE-CAPILLARY: 149 mg/dL — AB (ref 65–99)
Glucose-Capillary: 138 mg/dL — ABNORMAL HIGH (ref 65–99)

## 2015-08-27 LAB — AMMONIA: Ammonia: 100 umol/L — ABNORMAL HIGH (ref 9–35)

## 2015-08-27 LAB — CULTURE, BODY FLUID-BOTTLE: CULTURE: NO GROWTH

## 2015-08-27 NOTE — Progress Notes (Signed)
Subjective: Patient continued to improve. Presently he will force no complaints.   Objective: Vital signs in last 24 hours: Temp:  [98.2 F (36.8 C)-98.7 F (37.1 C)] 98.5 F (36.9 C) (01/16 0417) Pulse Rate:  [88-92] 88 (01/16 0417) Resp:  [20] 20 (01/16 0417) BP: (152-186)/(62-83) 152/62 mmHg (01/16 0417) SpO2:  [99 %-100 %] 100 % (01/16 0417) Weight:  [313 lb 6.4 oz (142.157 kg)] 313 lb 6.4 oz (142.157 kg) (01/16 0417)  Intake/Output from previous day: 01/15 0701 - 01/16 0700 In: 720 [P.O.:720] Out: 2550 [Urine:2550] Intake/Output this shift:    No results for input(s): HGB in the last 72 hours. No results for input(s): WBC, RBC, HCT, PLT in the last 72 hours.  Recent Labs  08/26/15 0646 08/27/15 0641  NA 137 139  K 5.1 4.7  CL 103 104  CO2 24 26  BUN 63* 63*  CREATININE 2.51* 2.20*  GLUCOSE 95 140*  CALCIUM 8.7* 8.8*   No results for input(s): LABPT, INR in the last 72 hours.  Generally patient is alert and in no apparent distress Chest decreased breath sound bilaterally. No wheezing Heart exam regular rate and rhythm. No murmur Abdomen: Full, distended but nontender Extremities he has possibly about 3+ edema  Assessment/Plan: Problem #1 acute kidney injury superimposed on chronic: His renal function continued to improve.. Problem #2 chronic renal failure: His thought to be secondary to diabetes/hypertension/recurrent AK I. He has a stage III chronic renal failure. Problem #3 anasarca: Presently he is on IV Lasix and metolazone. Patient has about 2500 mL of urine output. He has lost about 7 pounds since his admission. Problem #4 anemia Problem #5 history of diabetes: His blood pressure is reasonably controlled Problem #6 thrombocytopenia Problem #7 history of liver cirrhosis Problem #8 obesity Plan: We'll continue his present management We'll check his basic metabolic panel in the morning We'll switch him to by mouth diuretics in the morning. Patient also  advised to decrease his salt and fluid intake.    Calleigh Lafontant S 08/27/2015, 8:03 AM

## 2015-08-27 NOTE — Progress Notes (Signed)
[facility] PROGRESS NOTE  [name] [id number] DOB: 12/18/1960 DOA: 08/22/2015 PCP: [name], MD  Assessment/Plan: 1. Decompensated cirrhosis with ascites, s/p paracentesis with removal of 2333ml yellow fluid. Overall ascites has improved, will continue to monitor.  2. Hepatic encephalopathy, improving with improvement in ammonia levels. Adjustments to lactulose per GI recommendations. Continue current treatments.  3. AKI superimposed on CKD stage III, likely related to diuretics. Creatinine continues to improve at 2.20 today. Nephrology input appreciated. Patient counseled on sodium and fluid intake. Will continue IV Lasix and he has been started on Metolazone. Renal output apprx. 2.5L yesterday. 4. DM type 2, stable. Patient had episodes of hypoglycemia 1/14 so glipizide has been discontinued. Continue SSI. 5. Thrombocytopenia, chronic, related to liver disease. 6. Hyperkalemia, likely related to aldactone. Resolved.   Code Status: Full DVT prophylaxis: SCDs Family Communication: No family present Disposition Plan: Anticipate discharge within 2-3 days.    Consultants:  Gastroenterology  Nephrology   Procedures:  Paracentesis 1/11 with removal of 2.3L   Antibiotics:  None   HPI/Subjective: Feeling better. Confusion improving with multiple  bowel improvements. Breathing is doing well.   Objective: Filed Vitals:   08/26/15 2135 08/27/15 0417  BP: 166/71 152/62  Pulse: 89 88  Temp: 98.2 F (36.8 C) 98.5 F (36.9 C)  Resp: 20 20    Intake/Output Summary (Last 24 hours) at 08/27/15 0718 Last data filed at 08/27/15 0417  Gross per 24 hour  Intake    720 ml  Output   2550 ml  Net  -1830 ml   Filed Weights   08/25/15 0910 08/26/15 0500 08/27/15 0417  Weight: 146.92 kg (323 lb 14.4 oz) 145.151 kg (320 lb) 142.157 kg (313 lb 6.4 oz)    Exam:  General: Calm and comfortable sitting up in bed.  Cardiovascular: Regular rate and rhythm.   Respiratory: clear bilaterally, No wheezing, rales or rhonchi Abdomen: Positive bowel sounds. Soft, non tender. Mildly distended  Musculoskeletal: 2+ edema b/l Neurological: Improvement in confusion noted.    Data Reviewed: Basic Metabolic Panel:  Recent Labs Lab 08/22/15 1729 08/23/15 0631 08/24/15 0722 08/25/15 0647 08/26/15 0646  NA 137 136 134* 134* 137  K 5.4* 5.0 5.0 5.2* 5.1  CL 107 105 102 102 103  CO2 25 24 24 24 24   GLUCOSE 222* 140* 89 69 95  BUN 49* 49* 55* 61* 63*  CREATININE 1.85* 1.80* 2.25* 2.64* 2.51*  CALCIUM 7.9* 7.9* 8.3* 8.5* 8.7*   Liver Function Tests:  Recent Labs Lab 08/22/15 1729 08/23/15 0631 08/26/15 0646  AST 117* 113* 117*  ALT 51 49 45  ALKPHOS 120 106 97  BILITOT 4.4* 4.7* 4.4*  PROT 6.0* 5.8* 6.2*  ALBUMIN 2.1* 2.1* 3.0*    Recent Labs Lab 08/22/15 1729  LIPASE 93*   CBC:  Recent Labs Lab 08/22/15 1729 08/23/15 0631  WBC 5.9 4.3  NEUTROABS 3.4  --   HGB 10.2* 9.4*  HCT 30.5* 28.0*  MCV 96.8 96.6  PLT 61* 58*   Cardiac Enzymes:  Recent Labs Lab 08/22/15 1729  TROPONINI 0.03   BNP (last 3 results)  Recent Labs  06/11/15 1546  BNP 89.0    CBG:  Recent Labs Lab 08/26/15 0720 08/26/15 0936 08/26/15 1124 08/26/15 1629 08/26/15 2146  GLUCAP 90 124* 114* 104* 119*    Recent Results (from the past 240 hour(s))  Culture, body fluid-bottle     Status: None (Preliminary result)   Collection Time: 08/22/15  4:45  PM  Result Value Ref Range Status   Specimen Description ASCITIC  Final   Special Requests BOTTLES DRAWN AEROBIC AND ANAEROBIC 8CC EACH  Final   Culture NO GROWTH 4 DAYS  Final   Report Status PENDING  Incomplete  Gram stain     Status: None   Collection Time: 08/22/15  4:45 PM  Result Value Ref Range Status   Specimen Description ASCITIC  Final   Special Requests NONE  Final   Gram Stain NO ORGANISMS SEEN RARE WBC SEEN   Final   Report Status 08/22/2015 FINAL  Final     Studies: No  results found.  Scheduled Meds: . citalopram  20 mg Oral Daily  . cyclobenzaprine  5 mg Oral QHS  . furosemide  80 mg Intravenous BID  . insulin aspart  0-20 Units Subcutaneous TID WC  . insulin aspart  0-5 Units Subcutaneous QHS  . lactulose  30 g Oral TID  . magnesium oxide  400 mg Oral BID  . metolazone  5 mg Oral BID  . pantoprazole  40 mg Oral Daily  . rifaximin  550 mg Oral BID  . sodium chloride  3 mL Intravenous Q12H   Continuous Infusions:   Active Problems:   Encephalopathy, hepatic (HCC)   Diabetes (HCC)   Chronic hepatitis C with cirrhosis (HCC)   Ascites   Thrombocytopenia (HCC)   Anasarca   Chronic liver disease and cirrhosis (HCC)   Renal insufficiency   Acute renal failure superimposed on stage 3 chronic kidney disease (HCC)   Elevated bilirubin   Generalized abdominal pain    Time spent: 25 minutes   Muaad Boehning. MD  [facility] Pager [phone]. If 7PM-7AM, please contact night-coverage at [email or web address], password [facility] 08/27/2015, 7:18 AM  LOS: 5 days    . By signing my name below, I, Rennis Harding, attest that this documentation has been prepared under the direction and in the presence of Kathie Dike, MD. Electronically signed: Rennis Harding, Scribe. 08/27/2015   I, Dr. Kathie Dike, personally performed the services described in this documentaiton. All medical record entries made by the scribe were at my direction and in my presence. I have reviewed the chart and agree that the record reflects my personal performance and is accurate and complete  Kathie Dike, MD, 08/27/2015 7:18 AM

## 2015-08-27 NOTE — Progress Notes (Signed)
    Subjective: States he is confused. Feels dizzy. Doesn't know the year but knows he is in [place]. Doesn't like grits, ate only a small amount of breakfast. Feels like he has to urinate although he has a catheter. Appears BM X 1 in 24 hours per documentation. Previously was taking lactulose 10 g daily. Received new dosing of 30 g X 2 yesterday evening.   Objective: Vital signs in last 24 hours: Temp:  [98.2 F (36.8 C)-98.7 F (37.1 C)] 98.5 F (36.9 C) (01/16 0417) Pulse Rate:  [88-92] 88 (01/16 0417) Resp:  [20] 20 (01/16 0417) BP: (152-186)/(62-83) 152/62 mmHg (01/16 0417) SpO2:  [99 %-100 %] 99 % (01/16 0930) Weight:  [313 lb 6.4 oz (142.157 kg)] 313 lb 6.4 oz (142.157 kg) (01/16 0417) Last BM Date: 08/27/15 General:   Alert and oriented to person and situation, states slightly confused regarding the year  Abdomen:  Obese, no TTP Extremities:  With 3+ pitting lower extremity edema  Neurologic:  Alert and  oriented to person and situation, confused regarding year, mild asterixis on exam Psych:  Alert and cooperative. Flat affect   Intake/Output from previous day: 01/15 0701 - 01/16 0700 In: 720 [P.O.:720] Out: 2550 [Urine:2550] Intake/Output this shift: Total I/O In: 240 [P.O.:240] Out: 650 [Urine:650]BMET  Recent Labs  08/25/15 0647 08/26/15 0646 08/27/15 0641  NA 134* 137 139  K 5.2* 5.1 4.7  CL 102 103 104  CO2 24 24 26   GLUCOSE 69 95 140*  BUN 61* 63* 63*  CREATININE 2.64* 2.51* 2.20*  CALCIUM 8.5* 8.7* 8.8*   LFT  Recent Labs  08/26/15 0646 08/27/15 0910  PROT 6.2* 6.2*  ALBUMIN 3.0* 2.9*  AST 117* 118*  ALT 45 45  ALKPHOS 97 97  BILITOT 4.4* 4.4*  BILIDIR  --  2.1*  IBILI  --  2.3*    Assessment: 55 year old male with ETOH/HCV cirrhosis (MELDNa score 23, Child-Pugh Class C) presenting with anasarca, worsening renal function, and worsening mental status secondary to encephalopathy. Cr improved. Alert today but states he feels confused. Mild  asterixis on exam. Increased lactulose dosing yesterday, receiving 2 doses of new regimen thus far. Appears just one BM yesterday. Continue to monitor closely today: may need further titration of dosing.   Plan: Continue current lactulose dosing of 30 g TID. May need dose titration. Goal of 3 soft BMs daily Xifaxan BID Supportive care.   LOS: 5 days    08/27/2015, 10:48 AM  Addendum: after seeing patient, appears he has had 2 bowel movements this morning. Keep Lactulose dosing as prescribed and monitor closely. Orvil Feil, ANP-BC [facility] Gastroenter[facility]

## 2015-08-28 LAB — BASIC METABOLIC PANEL
ANION GAP: 9 (ref 5–15)
BUN: 55 mg/dL — AB (ref 6–20)
CO2: 26 mmol/L (ref 22–32)
Calcium: 8.4 mg/dL — ABNORMAL LOW (ref 8.9–10.3)
Chloride: 101 mmol/L (ref 101–111)
Creatinine, Ser: 1.96 mg/dL — ABNORMAL HIGH (ref 0.61–1.24)
GFR calc non Af Amer: 37 mL/min — ABNORMAL LOW (ref >60)
GFR, EST AFRICAN AMERICAN: 43 mL/min — AB (ref >60)
Glucose, Bld: 140 mg/dL — ABNORMAL HIGH (ref 65–99)
Potassium: 4.2 mmol/L (ref 3.5–5.1)
Sodium: 136 mmol/L (ref 135–145)

## 2015-08-28 LAB — GLUCOSE, CAPILLARY
GLUCOSE-CAPILLARY: 199 mg/dL — AB (ref 65–99)
GLUCOSE-CAPILLARY: 144 mg/dL — AB (ref 65–99)
GLUCOSE-CAPILLARY: 182 mg/dL — AB (ref 65–99)
Glucose-Capillary: 133 mg/dL — ABNORMAL HIGH (ref 65–99)

## 2015-08-28 LAB — AMMONIA: AMMONIA: 69 umol/L — AB (ref 9–35)

## 2015-08-28 MED ORDER — LACTULOSE 10 GM/15ML PO SOLN
30.0000 g | Freq: Two times a day (BID) | ORAL | Status: DC
  Administered 2015-08-28: 30 g via ORAL
  Filled 2015-08-28: qty 60

## 2015-08-28 MED ORDER — TORSEMIDE 20 MG PO TABS
40.0000 mg | ORAL_TABLET | Freq: Every day | ORAL | Status: DC
  Administered 2015-08-28 – 2015-08-29 (×2): 40 mg via ORAL
  Filled 2015-08-28 (×2): qty 2

## 2015-08-28 MED ORDER — LACTULOSE 10 GM/15ML PO SOLN: 30.0000 g | Freq: Every day | ORAL | Status: DC

## 2015-08-28 NOTE — Progress Notes (Signed)
    Subjective: "Not dizzy-headed anymore. Pooped myself to death yesterday. I feel better than I did yesterday".   Objective: Vital signs in last 24 hours: Temp:  [98.2 F (36.8 C)-98.9 F (37.2 C)] 98.2 F (36.8 C) (01/17 0614) Pulse Rate:  [84-93] 84 (01/17 0614) Resp:  [18-20] 18 (01/17 0614) BP: (135-166)/(47-86) 154/86 mmHg (01/17 0614) SpO2:  [99 %-100 %] 100 % (01/17 0614) Last BM Date: 08/27/15 General:   Alert and oriented, pleasant Head:  Normocephalic and atraumatic. Eyes:  +scleral icterus  Abdomen:  Bowel sounds present, largely obese but soft Extremities:  2+ lower extremity pitting edema, appears marginally better from yesterday  Neurologic:  Alert and  oriented x4;  No asterixis Psych:  Alert and cooperative. Normal mood and affect.  Intake/Output from previous day: 01/16 0701 - 01/17 0700 In: 1440 [P.O.:1440] Out: 3675 [Urine:3675] Intake/Output this shift:   BMET  Recent Labs  08/26/15 0646 08/27/15 0641 08/28/15 0620  NA 137 139 136  K 5.1 4.7 4.2  CL 103 104 101  CO2 24 26 26   GLUCOSE 95 140* 140*  BUN 63* 63* 55*  CREATININE 2.51* 2.20* 1.96*  CALCIUM 8.7* 8.8* 8.4*   LFT  Recent Labs  08/26/15 0646 08/27/15 0910  PROT 6.2* 6.2*  ALBUMIN 3.0* 2.9*  AST 117* 118*  ALT 45 45  ALKPHOS 97 97  BILITOT 4.4* 4.4*  BILIDIR  --  2.1*  IBILI  --  2.3*   PT/INR  Recent Labs  08/27/15 0910  LABPROT 16.4*  INR 1.31    Assessment: 55 year old male with ETOH/HCV cirrhosis (MELDNa score 23 on 1/16, Child-Pugh Class C) presenting with anasarca, worsening renal function, and worsening mental status secondary to encephalopathy. Cr continues to improve. Ammonia level improved. Clinically, he has shown significant improvement. Due to multiple loose stools (7), will need to titrate lactulose dosing just slightly.      Plan: Lactulose 30 g BID with goal of 3 soft BMs daily Xifaxan BID Supportive care  Orvil Feil, ANP-BC [facility]  Gastroenterology     LOS: 6 days    08/28/2015, 8:07 AM

## 2015-08-28 NOTE — Progress Notes (Signed)
Subjective: Patient is presently asymptomatic. He denies any difficulty breathing.   Objective: Vital signs in last 24 hours: Temp:  [98.2 F (36.8 C)-98.9 F (37.2 C)] 98.2 F (36.8 C) (01/17 0614) Pulse Rate:  [84-93] 84 (01/17 0614) Resp:  [18-20] 18 (01/17 0614) BP: (135-166)/(47-86) 154/86 mmHg (01/17 0614) SpO2:  [99 %-100 %] 100 % (01/17 0614)  Intake/Output from previous day: 01/16 0701 - 01/17 0700 In: 1440 [P.O.:1440] Out: 3675 [Urine:3675] Intake/Output this shift:    No results for input(s): HGB in the last 72 hours. No results for input(s): WBC, RBC, HCT, PLT in the last 72 hours.  Recent Labs  08/27/15 0641 08/28/15 0620  NA 139 136  K 4.7 4.2  CL 104 101  CO2 26 26  BUN 63* 55*  CREATININE 2.20* 1.96*  GLUCOSE 140* 140*  CALCIUM 8.8* 8.4*    Recent Labs  08/27/15 0910  INR 1.31    Generally patient is alert and in no apparent distress Chest decreased breath sound bilaterally. No wheezing Heart exam regular rate and rhythm. No murmur Abdomen: Full, distended but nontender Extremities he has possibly about 3+ edema  Assessment/Plan: Problem #1 acute kidney injury superimposed on chronic: His BUN and creatinine continue to improve. Presently returning to his baseline.. Problem #2 chronic renal failure: His thought to be secondary to diabetes/hypertension/recurrent AK I. He has a stage III chronic renal failure. Problem #3 anasarca: Presently he is on IV Lasix and metolazone. Patient had 3600 mL of urine output. Still he has significant sign of fluid overload he was always improving. Problem #4 anemia Problem #5 history of diabetes: His blood pressure is reasonably controlled Problem #6 thrombocytopenia Problem #7 history of liver cirrhosis: His ascites seems to be stable and followed by GI. Problem #8 obesity Plan: 1] we'll DC Lasix 2] will start on Demadex 40 mg by mouth daily and continue with metolazone. 3] We'll check his basic metabolic  panel in the morning.    [name] S 08/28/2015, 8:11 AM

## 2015-08-28 NOTE — Progress Notes (Signed)
[facility] PROGRESS NOTE  [name] [id number] DOB: 1961-06-20 DOA: 08/22/2015 PCP: [name], MD Summary  34 yom with history of cirrhosis of the liver secondary to alcoholism and hepatitis C, presented with complaints of increasing abdominal swelling. He was found to significant ascites requiring paracentesis. GI consulted assisted in his care. Patient also had acute on chronic renal failure for which nephrology has been seeing him and adjusting diuretics. His hospital course was complicated by development of hepatic encephalopathy. Ammonia is now trending down with lactulose. Anticipate discharge within 24 hours if his renal functions continue to improve.   Assessment/Plan: 1. Decompensated cirrhosis with ascites, s/p paracentesis with removal of 2349ml yellow fluid. Overall ascites has improved, will continue to monitor.  2. Hepatic encephalopathy, appears to be at baseline with improvement in ammonia levels. Adjustments to lactulose per GI recommendations. Continue current treatments.  3. AKI superimposed on CKD stage III, likely related to diuretics. Creatinine continues to improve. Nephrology input appreciated. Patient counseled on sodium and fluid intake. Per nephrology discontinue Lasix, start Demadex 40mg  daily anc continue Metolazone. Renal output 3.6L yesterday.  4. DM type 2, stable. Patient had episodes of hypoglycemia 1/14 so glipizide has been discontinued. Continue SSI. 5. Thrombocytopenia, chronic, related to liver disease. 6. Hyperkalemia, likely related to aldactone. Resolved.   Code Status: Full DVT prophylaxis: SCDs Family Communication: No family present Disposition Plan: Anticipate discharge within 24 hours.    Consultants:  Gastroenterology  Nephrology   Procedures:  Paracentesis 1/11 with removal of 2.3L   Antibiotics:  None .  HPI/Subjective: Abdomen is still mildly tight. Overall improving. No longer shaking and is able to move  all extremities   Objective: Filed Vitals:   08/27/15 2151 08/28/15 0614  BP: 166/57 154/86  Pulse: 93 84  Temp: 98.9 F (37.2 C) 98.2 F (36.8 C)  Resp: 18 18    Intake/Output Summary (Last 24 hours) at 08/28/15 0718 Last data filed at 08/28/15 0419  Gross per 24 hour  Intake   1440 ml  Output   3675 ml  Net  -2235 ml   Filed Weights   08/25/15 0910 08/26/15 0500 08/27/15 0417  Weight: 146.92 kg (323 lb 14.4 oz) 145.151 kg (320 lb) 142.157 kg (313 lb 6.4 oz)    Exam:  General: NAD, looks comfortable Cardiovascular: RRR, S1, S2  Respiratory: clear bilaterally, No wheezing, rales or rhonchi Abdomen: soft, non tender, mildly distend, bowel sounds normal Musculoskeletal: 1+ edema b/l    Data Reviewed: Basic Metabolic Panel:  Recent Labs Lab 08/23/15 0631 08/24/15 0722 08/25/15 0647 08/26/15 0646 08/27/15 0641  NA 136 134* 134* 137 139  K 5.0 5.0 5.2* 5.1 4.7  CL 105 102 102 103 104  CO2 24 24 24 24 26   GLUCOSE 140* 89 69 95 140*  BUN 49* 55* 61* 63* 63*  CREATININE 1.80* 2.25* 2.64* 2.51* 2.20*  CALCIUM 7.9* 8.3* 8.5* 8.7* 8.8*   Liver Function Tests:  Recent Labs Lab 08/22/15 1729 08/23/15 0631 08/26/15 0646 08/27/15 0910  AST 117* 113* 117* 118*  ALT 51 49 45 45  ALKPHOS 120 106 97 97  BILITOT 4.4* 4.7* 4.4* 4.4*  PROT 6.0* 5.8* 6.2* 6.2*  ALBUMIN 2.1* 2.1* 3.0* 2.9*    Recent Labs Lab 08/22/15 1729  LIPASE 93*   CBC:  Recent Labs Lab 08/22/15 1729 08/23/15 0631  WBC 5.9 4.3  NEUTROABS 3.4  --   HGB 10.2* 9.4*  HCT 30.5* 28.0*  MCV 96.8 96.6  PLT 61* 58*   Cardiac Enzymes:  Recent Labs Lab 08/22/15 1729  TROPONINI 0.03   BNP (last 3 results)  Recent Labs  06/11/15 1546  BNP 89.0    CBG:  Recent Labs Lab 08/26/15 1629 08/26/15 2146 08/27/15 0724 08/27/15 1125 08/27/15 2128  GLUCAP 104* 119* 122* 149* 138*    Recent Results (from the past 240 hour(s))  Culture, body fluid-bottle     Status: None    Collection Time: 08/22/15  4:45 PM  Result Value Ref Range Status   Specimen Description ASCITIC  Final   Special Requests BOTTLES DRAWN AEROBIC AND ANAEROBIC 8CC EACH  Final   Culture NO GROWTH 5 DAYS  Final   Report Status 08/27/2015 FINAL  Final  Gram stain     Status: None   Collection Time: 08/22/15  4:45 PM  Result Value Ref Range Status   Specimen Description ASCITIC  Final   Special Requests NONE  Final   Gram Stain NO ORGANISMS SEEN RARE WBC SEEN   Final   Report Status 08/22/2015 FINAL  Final     Studies: No results found.  Scheduled Meds: . citalopram  20 mg Oral Daily  . cyclobenzaprine  5 mg Oral QHS  . furosemide  80 mg Intravenous BID  . insulin aspart  0-20 Units Subcutaneous TID WC  . insulin aspart  0-5 Units Subcutaneous QHS  . lactulose  30 g Oral TID  . magnesium oxide  400 mg Oral BID  . metolazone  5 mg Oral BID  . pantoprazole  40 mg Oral Daily  . rifaximin  550 mg Oral BID  . sodium chloride  3 mL Intravenous Q12H   Continuous Infusions:   Active Problems:   Encephalopathy, hepatic (HCC)   Diabetes (HCC)   Chronic hepatitis C with cirrhosis (HCC)   Ascites   Thrombocytopenia (HCC)   Anasarca   Chronic liver disease and cirrhosis (HCC)   Renal insufficiency   Acute renal failure superimposed on stage 3 chronic kidney disease (HCC)   Elevated bilirubin   Generalized abdominal pain    Time spent: 25 minutes   Jehanzeb Memon. MD  [facility] Pager [phone]. If 7PM-7AM, please contact night-coverage at [email or web address], password [facility] 08/28/2015, 7:18 AM  LOS: 6 days    . By signing my name below, I, Rennis Harding, attest that this documentation has been prepared under the direction and in the presence of Kathie Dike, MD. Electronically signed: Rennis Harding, Scribe. 08/28/2015 12:00pm.  I, Dr. Kathie Dike, personally performed the services described in this documentaiton. All medical record entries made by the scribe  were at my direction and in my presence. I have reviewed the chart and agree that the record reflects my personal performance and is accurate and complete  Kathie Dike, MD, 08/28/2015 12:51 PM

## 2015-08-29 ENCOUNTER — Ambulatory Visit: Payer: Self-pay | Admitting: *Deleted

## 2015-08-29 ENCOUNTER — Ambulatory Visit: Payer: Self-pay

## 2015-08-29 ENCOUNTER — Telehealth: Payer: Self-pay | Admitting: Gastroenterology

## 2015-08-29 LAB — BASIC METABOLIC PANEL WITH GFR
Anion gap: 7 (ref 5–15)
BUN: 54 mg/dL — ABNORMAL HIGH (ref 6–20)
CO2: 28 mmol/L (ref 22–32)
Calcium: 8.2 mg/dL — ABNORMAL LOW (ref 8.9–10.3)
Chloride: 99 mmol/L — ABNORMAL LOW (ref 101–111)
Creatinine, Ser: 1.75 mg/dL — ABNORMAL HIGH (ref 0.61–1.24)
GFR calc Af Amer: 50 mL/min — ABNORMAL LOW
GFR calc non Af Amer: 43 mL/min — ABNORMAL LOW
Glucose, Bld: 117 mg/dL — ABNORMAL HIGH (ref 65–99)
Potassium: 3.6 mmol/L (ref 3.5–5.1)
Sodium: 134 mmol/L — ABNORMAL LOW (ref 135–145)

## 2015-08-29 LAB — GLUCOSE, CAPILLARY
Glucose-Capillary: 137 mg/dL — ABNORMAL HIGH (ref 65–99)
Glucose-Capillary: 205 mg/dL — ABNORMAL HIGH (ref 65–99)

## 2015-08-29 MED ORDER — TORSEMIDE 20 MG PO TABS: 40.0000 mg | ORAL_TABLET | Freq: Every day | ORAL | Status: DC

## 2015-08-29 MED ORDER — METOLAZONE 5 MG PO TABS: 5.0000 mg | ORAL_TABLET | Freq: Two times a day (BID) | ORAL | Status: DC

## 2015-08-29 MED ORDER — SPIRONOLACTONE 25 MG PO TABS
25.0000 mg | ORAL_TABLET | Freq: Every day | ORAL | Status: DC
  Administered 2015-08-29: 25 mg via ORAL
  Filled 2015-08-29: qty 1

## 2015-08-29 NOTE — Telephone Encounter (Signed)
Needs OV with [facility] in 3-4 weeks for hospital follow up.

## 2015-08-29 NOTE — Progress Notes (Signed)
[name]  MRN: [id number]  DOB/AGE: [birth year] 55 y.o.  Primary Care Physician:MILLER, Lillette Boxer, MD  Admit date: 08/22/2015  Chief Complaint:  Chief Complaint  Patient presents with  . Abdominal Pain  . Shortness of Breath    S-Pt presented on  08/22/2015 with  Chief Complaint  Patient presents with  . Abdominal Pain  . Shortness of Breath  .    Pt says "I feel a lot of better, I was just weighed and I have lost so much fluid"   Meds . citalopram  20 mg Oral Daily  . cyclobenzaprine  5 mg Oral QHS  . insulin aspart  0-20 Units Subcutaneous TID WC  . insulin aspart  0-5 Units Subcutaneous QHS  . lactulose  30 g Oral QAC lunch  . magnesium oxide  400 mg Oral BID  . metolazone  5 mg Oral BID  . pantoprazole  40 mg Oral Daily  . rifaximin  550 mg Oral BID  . sodium chloride  3 mL Intravenous Q12H  . torsemide  40 mg Oral Daily      Physical Exam: Vital signs in last 24 hours: Temp:  [97.8 F (36.6 C)-98.3 F (36.8 C)] 98 F (36.7 C) (01/18 0612) Pulse Rate:  [77-83] 81 (01/18 0612) Resp:  [18] 18 (01/18 0612) BP: (136-144)/(57-64) 144/64 mmHg (01/18 0612) SpO2:  [96 %-100 %] 96 % (01/18 0820) Weight:  [307 lb 15.7 oz (139.7 kg)] 307 lb 15.7 oz (139.7 kg) (01/18 0612) Weight change:  Last BM Date: 08/28/15  Intake/Output from previous day: 01/17 0701 - 01/18 0700 In: 1560 [P.O.:1560] Out: 3300 [Urine:3300]     Physical Exam: General- pt is awake,alert, oriented to time place and person Resp- No acute REsp distress, CTA B/L NO Rhonchi CVS- S1S2 regular in rate and rhythm GIT- BS+, soft, NT,Distended EXT- 2+ LE Edema,NO  Cyanosis   Lab Results:  HGb 9.4 ( 08/23/15)  BMET  Recent Labs  08/28/15 0620 08/29/15 0704  NA 136 134*  K 4.2 3.6  CL 101 99*  CO2 26 28  GLUCOSE 140* 117*  BUN 55* 54*  CREATININE 1.96* 1.75*  CALCIUM 8.4* 8.2*    Creat trend 2017  1.85=>2.64=>1.96=>1.75 2016  1.4--2.4  MICRO Recent Results (from the past  240 hour(s))  Culture, body fluid-bottle     Status: None   Collection Time: 08/22/15  4:45 PM  Result Value Ref Range Status   Specimen Description ASCITIC  Final   Special Requests BOTTLES DRAWN AEROBIC AND ANAEROBIC 8CC EACH  Final   Culture NO GROWTH 5 DAYS  Final   Report Status 08/27/2015 FINAL  Final  Gram stain     Status: None   Collection Time: 08/22/15  4:45 PM  Result Value Ref Range Status   Specimen Description ASCITIC  Final   Special Requests NONE  Final   Gram Stain NO ORGANISMS SEEN RARE WBC SEEN   Final   Report Status 08/22/2015 FINAL  Final      Lab Results  Component Value Date   CALCIUM 8.2* 08/29/2015        Impression: 1)Renal  AKI secondary to Prerenal/ATN                AKI now better                Creat trending down                AKI most likley on CKD  CKD stage 3.               CKD since 2016               CKD secondary to DM/HTN/Hep C                Progression of CKD marked with multiple AKI                  2)HTN  Medication- On Diuretics  3)Anemia HGb stable   4)Anasarca sec to cirrhosis   On diuretics   5)CNS- admitted with hepatic encephalopathy Primary MD following  6)Electrolytes Normokalemic  Hyponatremic   Hypervolemic hyponatremia-sec to cirrhosis   7)Acid base Co2 at goal     Plan:  Will restart low dose spironolactone  as k is now trending low. Will follow bmet    Helena S 08/29/2015, 8:46 AM

## 2015-08-29 NOTE — Care Management Important Message (Signed)
Important Message  Patient Details  Name: [name] MRN: [id number] Date of Birth: April 02, 1961   Medicare Important Message Given:  Yes    Alvie Heidelberg, RN 08/29/2015, 4:29 PM

## 2015-08-29 NOTE — Discharge Summary (Signed)
Physician Discharge Summary  [name] [id number] DOB: 09-15-1960 DOA: 08/22/2015  PCP: [name], MD  Admit date: 08/22/2015 Discharge date: 08/29/2015  Time spent: 35 minutes  Recommendations for Outpatient Follow-up:  1. Follow up with PCP in one week. 2. Follow up with nephrologist next week.    Discharge Diagnoses:  Active Problems:   Encephalopathy, hepatic (HCC)   Diabetes (HCC)   Chronic hepatitis C with cirrhosis (HCC)   Ascites   Thrombocytopenia (HCC)   Anasarca   Chronic liver disease and cirrhosis (HCC)   Renal insufficiency   Acute renal failure superimposed on stage 3 chronic kidney disease (HCC)   Elevated bilirubin   Generalized abdominal pain   Discharge Condition: improved.  Ascites is better.  Cr at baseline, and alert, orient, and no hepatic encephalopathy.   Diet recommendation: Fluid restriction ot 1200 cc, and Low salt.   Filed Weights   08/27/15 0417 08/28/15 0815 08/29/15 0612  Weight: 142.157 kg (313 lb 6.4 oz) 139.164 kg (306 lb 12.8 oz) 139.7 kg (307 lb 15.7 oz)    History of present illness: patient was admitted by Dr Anastasio Champion on Aug 22, 2015 for abdominal swelling, altered mental status, and was having AKI on CKD.  As per his H and P:  " [name] is a 55 y.o. male  This is a 55 year old man who has a history of cirrhosis of the liver secondary to a combination of alcoholism and hepatitis C, who now presents with 3-4 day history of increasing abdominal swelling and pain associated with diarrhea and cough, dyspnea and itching. He denies any hematemesis. There is been no melanotic stool. There is no rectal bleeding. He denies a fever. He denies any current alcohol intake. Evaluation in the emergency room showed him to have quite massive ascites and radiology has already drained 2.3 L of ascites. The ascites has been sent for diagnostic studies. He is now being admitted for further management.   1. Hospital Course: Patient was  admitted for decompensated cirrhosis with ascites, and he had large volume paracentesis with removal of 2331ml yellow fluid. Overall ascites has improved, will continue to monitor. He was also seen by nephrology and his diuretics were changed.  He was placed on Demadex, and Zaroxylin.  With this, he continued to diurese well. He did have hepatic encephalopathy, appears to be at baseline with improvement in ammonia levels. Adjustments to lactulose per GI recommendations. Continue current treatments upon discharge, along with Xifaxamin. As for his AKI I superimposed on CKD stage III, likely related to diuretics. Creatinine continues to improve. Nephrology input appreciated. Patient counseled on sodium and fluid intake. Per nephrology discontinue Lasix, start Demadex 40mg  daily anc continue Metolazone. Renal output was significant, and he felt better as well.  With his DM type 2,  It has been stable. Patient had episodes of hypoglycemia 1/14 so glipizide has been discontinued. His thrombocytopenia, chronic, related to liver disease. He had some hyperkalemia, likely related to aldactone and it has improved.  He is very anxious to go home, and is stable for discharge.  He will follow up with his PCP next week, along with seeing his nephrologist for his CKD as well.    Consultations:  Nephrology  Gastroenterology.   Discharge Exam: Filed Vitals:   08/29/15 0612 08/29/15 1346  BP: 144/64 131/57  Pulse: 81 72  Temp: 98 F (36.7 C)   Resp: 18 18    Discharge Instructions   Discharge Instructions  Diet - low sodium heart healthy    Complete by:  As directed      Increase activity slowly    Complete by:  As directed           Current Discharge Medication List    START taking these medications   Details  metolazone (ZAROXOLYN) 5 MG tablet Take 1 tablet (5 mg total) by mouth 2 (two) times daily. Qty: 60 tablet, Refills: 0    torsemide (DEMADEX) 20 MG tablet Take 2 tablets (40 mg total) by  mouth daily. Qty: 30 tablet, Refills: 1      CONTINUE these medications which have NOT CHANGED   Details  citalopram (CELEXA) 20 MG tablet Take 1 tablet (20 mg total) by mouth daily. Qty: 30 tablet, Refills: 3    cyclobenzaprine (FLEXERIL) 5 MG tablet Take 1 tablet (5 mg total) by mouth at bedtime. Qty: 30 tablet, Refills: 0    furosemide (LASIX) 40 MG tablet Take 2 tablets (80 mg total) by mouth 2 (two) times daily. Qty: 120 tablet, Refills: 2    glipiZIDE (GLUCOTROL) 10 MG tablet TAKE 1 TABLET (10 MG TOTAL) BY MOUTH DAILY. Qty: 30 tablet, Refills: 1    hydrOXYzine (ATARAX/VISTARIL) 10 MG tablet TAKE 1 TABLET (10 MG TOTAL) BY MOUTH EVERY 4 (FOUR) HOURS AS NEEDED FOR ITCHING. Qty: 30 tablet, Refills: 1    Insulin Glargine (LANTUS SOLOSTAR) 100 UNIT/ML Solostar Pen Inject 10 Units into the skin at bedtime.    lactulose (CHRONULAC) 10 GM/15ML solution Take 15 mLs (10 g total) by mouth daily. TAKE 15 MLS (10 G TOTAL) BY MOUTH 3 (THREE) TIMES DAILY. Qty: 473 mL, Refills: 1    LORazepam (ATIVAN) 0.5 MG tablet Take 1 tablet (0.5 mg total) by mouth 2 (two) times daily as needed for anxiety. Qty: 20 tablet, Refills: 2    magnesium oxide (MAG-OX) 400 (241.3 MG) MG tablet Take 1 tablet (400 mg total) by mouth 2 (two) times daily. Qty: 60 tablet, Refills: 1    omeprazole (PRILOSEC) 20 MG capsule Take 2 capsules (40 mg total) by mouth daily. Qty: 60 capsule, Refills: 2    Oxycodone HCl 10 MG TABS Take 1 tablet (10 mg total) by mouth every 6 (six) hours as needed. Qty: 120 tablet, Refills: 0    rifaximin (XIFAXAN) 550 MG TABS tablet Take 1 tablet (550 mg total) by mouth 2 (two) times daily. Qty: 60 tablet, Refills: 5    albuterol (PROVENTIL HFA;VENTOLIN HFA) 108 (90 BASE) MCG/ACT inhaler Inhale 2 puffs into the lungs every 6 (six) hours as needed. Shortness of breath Qty: 1 Inhaler, Refills: 1    hydrocerin (EUCERIN) CREA Apply 1 application topically 3 (three) times daily as needed  (for itching/dry skin). Refills: 0    spironolactone (ALDACTONE) 50 MG tablet Take 1 tablet (50 mg total) by mouth daily. Qty: 30 tablet, Refills: 3      STOP taking these medications     acetaminophen (TYLENOL) 500 MG tablet      glucose blood test strip      oxyCODONE-acetaminophen (PERCOCET) 10-325 MG tablet        Allergies  Allergen Reactions  . Penicillins Hives    Has patient had a PCN reaction causing immediate rash, facial/tongue/throat swelling, SOB or lightheadedness with hypotension: Yes Has patient had a PCN reaction causing severe rash involving mucus membranes or skin necrosis: Yes Has patient had a PCN reaction that required hospitalization No Has patient had a PCN reaction occurring  within the last 10 years: No If all of the above answers are "NO", then may proceed with Cephalosporin use.       The results of significant diagnostics from this hospitalization (including imaging, microbiology, ancillary and laboratory) are listed below for reference.    Significant Diagnostic Studies: Dg Chest 2 View  08/22/2015  CLINICAL DATA:  Productive cough and shortness of breath for 2 weeks. COPD. Cirrhosis. EXAM: CHEST  2 VIEW COMPARISON:  07/02/2015 FINDINGS: The heart size and mediastinal contours are within normal limits. Both lungs are clear. The visualized skeletal structures are unremarkable. IMPRESSION: No active cardiopulmonary disease. Electronically Signed   By: Earle Gell M.D.   On: 08/22/2015 17:51   US Abdomen Limited  08/22/2015  CLINICAL DATA:  Increased abdominal distention. Ascites. Cirrhosis. EXAM: LIMITED ABDOMEN ULTRASOUND FOR ASCITES TECHNIQUE: Limited ultrasound survey for ascites was performed in all four abdominal quadrants. COMPARISON:  None. FINDINGS: Moderate ascites is seen in all 4 abdominal quadrants. IMPRESSION: Moderate ascites visualized in all 4 abdominal quadrants. Electronically Signed   By: Earle Gell M.D.   On: 08/22/2015 16:23   US  Paracentesis  08/22/2015  CLINICAL DATA:  Cirrhosis, ascites, abdominal swelling EXAM: ULTRASOUND GUIDED DIAGNOSTIC AND THERAPEUTIC PARACENTESIS COMPARISON:  None PROCEDURE: Procedure, benefits, and risks of procedure were discussed with patient. Written informed consent for procedure was obtained. Time out protocol followed. Adequate collection of ascites localized by ultrasound in LEFT lower quadrant. Skin prepped and draped in usual sterile fashion. Skin and soft tissues anesthetized with 10 mL of 1% lidocaine. 5 [place] Yueh catheter placed into peritoneal cavity. 2300 mL of yellow ascites aspirated by vacuum bottle suction. Procedure tolerated well by patient without immediate complication. COMPLICATIONS: None. FINDINGS: A total of approximately 2300 mL of ascitic fluid was removed. A fluid sample of 180 mL was sent for laboratory analysis. IMPRESSION: Successful ultrasound guided paracentesis yielding 2300 mL of ascites. Electronically Signed   By: Lavonia Dana M.D.   On: 08/22/2015 17:29    Microbiology: Recent Results (from the past 240 hour(s))  Culture, body fluid-bottle     Status: None   Collection Time: 08/22/15  4:45 PM  Result Value Ref Range Status   Specimen Description ASCITIC  Final   Special Requests BOTTLES DRAWN AEROBIC AND ANAEROBIC 8CC EACH  Final   Culture NO GROWTH 5 DAYS  Final   Report Status 08/27/2015 FINAL  Final  Gram stain     Status: None   Collection Time: 08/22/15  4:45 PM  Result Value Ref Range Status   Specimen Description ASCITIC  Final   Special Requests NONE  Final   Gram Stain NO ORGANISMS SEEN RARE WBC SEEN   Final   Report Status 08/22/2015 FINAL  Final     Labs: Basic Metabolic Panel:  Recent Labs Lab 08/25/15 0647 08/26/15 0646 08/27/15 0641 08/28/15 0620 08/29/15 0704  NA 134* 137 139 136 134*  K 5.2* 5.1 4.7 4.2 3.6  CL 102 103 104 101 99*  CO2 24 24 26 26 28   GLUCOSE 69 95 140* 140* 117*  BUN 61* 63* 63* 55* 54*  CREATININE 2.64*  2.51* 2.20* 1.96* 1.75*  CALCIUM 8.5* 8.7* 8.8* 8.4* 8.2*   Liver Function Tests:  Recent Labs Lab 08/22/15 1729 08/23/15 0631 08/26/15 0646 08/27/15 0910  AST 117* 113* 117* 118*  ALT 51 49 45 45  ALKPHOS 120 106 97 97  BILITOT 4.4* 4.7* 4.4* 4.4*  PROT 6.0* 5.8* 6.2* 6.2*  ALBUMIN 2.1* 2.1* 3.0* 2.9*    Recent Labs Lab 08/22/15 1729  LIPASE 93*    Recent Labs Lab 08/26/15 0939 08/27/15 0641 08/28/15 0620  AMMONIA 138* 100* 69*   CBC:  Recent Labs Lab 08/22/15 1729 08/23/15 0631  WBC 5.9 4.3  NEUTROABS 3.4  --   HGB 10.2* 9.4*  HCT 30.5* 28.0*  MCV 96.8 96.6  PLT 61* 58*   Cardiac Enzymes:  Recent Labs Lab 08/22/15 1729  TROPONINI 0.03   BNP: BNP (last 3 results)  Recent Labs  06/11/15 1546  BNP 89.0    CBG:  Recent Labs Lab 08/28/15 1140 08/28/15 1654 08/28/15 2019 08/29/15 0816 08/29/15 1207  GLUCAP 199* 144* 182* 137* 205*   Signed:  Aunika Kirsten MD.  [facility] 08/29/2015, 3:33 PM

## 2015-08-29 NOTE — Progress Notes (Signed)
Subjective:  Wants to go home. Confusion resolved. No other complaints.  Objective: Vital signs in last 24 hours: Temp:  [97.8 F (36.6 C)-98.3 F (36.8 C)] 98 F (36.7 C) (01/18 0612) Pulse Rate:  [77-83] 81 (01/18 0612) Resp:  [18] 18 (01/18 0612) BP: (136-144)/(57-64) 144/64 mmHg (01/18 0612) SpO2:  [96 %-100 %] 96 % (01/18 0820) Weight:  [307 lb 15.7 oz (139.7 kg)] 307 lb 15.7 oz (139.7 kg) (01/18 0612) Last BM Date: 08/28/15 General:   Alert,  Well-developed, well-nourished, pleasant and cooperative in NAD Head:  Normocephalic and atraumatic. Eyes:  Sclera clear, + icterus.  Abdomen:  Soft, some distention but not tense. No masses, hepatosplenomegaly or hernias noted. Normal bowel sounds, without guarding, and without rebound.   Extremities:  Without clubbing, deformity or edema. Neurologic:  Alert and  oriented x4;  grossly normal neurologically. No asterixis.  Skin:  Intact without significant lesions or rashes. Psych:  Alert and cooperative. Normal mood and affect.  Intake/Output from previous day: 01/17 0701 - 01/18 0700 In: 1560 [P.O.:1560] Out: 3300 [Urine:3300] Intake/Output this shift:    Lab Results: CBC No results for input(s): WBC, HGB, HCT, MCV, PLT in the last 72 hours. BMET  Recent Labs  08/27/15 0641 08/28/15 0620 08/29/15 0704  NA 139 136 134*  K 4.7 4.2 3.6  CL 104 101 99*  CO2 26 26 28   GLUCOSE 140* 140* 117*  BUN 63* 55* 54*  CREATININE 2.20* 1.96* 1.75*  CALCIUM 8.8* 8.4* 8.2*   LFTs  Recent Labs  08/27/15 0910  BILITOT 4.4*  BILIDIR 2.1*  IBILI 2.3*  ALKPHOS 97  AST 118*  ALT 45  PROT 6.2*  ALBUMIN 2.9*   No results for input(s): LIPASE in the last 72 hours. PT/INR  Recent Labs  08/27/15 0910  LABPROT 16.4*  INR 1.31      Imaging Studies: Dg Chest 2 View  08/22/2015  CLINICAL DATA:  Productive cough and shortness of breath for 2 weeks. COPD. Cirrhosis. EXAM: CHEST  2 VIEW COMPARISON:  07/02/2015 FINDINGS:  The heart size and mediastinal contours are within normal limits. Both lungs are clear. The visualized skeletal structures are unremarkable. IMPRESSION: No active cardiopulmonary disease. Electronically Signed   By: Earle Gell M.D.   On: 08/22/2015 17:51   US Abdomen Limited  08/22/2015  CLINICAL DATA:  Increased abdominal distention. Ascites. Cirrhosis. EXAM: LIMITED ABDOMEN ULTRASOUND FOR ASCITES TECHNIQUE: Limited ultrasound survey for ascites was performed in all four abdominal quadrants. COMPARISON:  None. FINDINGS: Moderate ascites is seen in all 4 abdominal quadrants. IMPRESSION: Moderate ascites visualized in all 4 abdominal quadrants. Electronically Signed   By: Earle Gell M.D.   On: 08/22/2015 16:23   US Paracentesis  08/22/2015  CLINICAL DATA:  Cirrhosis, ascites, abdominal swelling EXAM: ULTRASOUND GUIDED DIAGNOSTIC AND THERAPEUTIC PARACENTESIS COMPARISON:  None PROCEDURE: Procedure, benefits, and risks of procedure were discussed with patient. Written informed consent for procedure was obtained. Time out protocol followed. Adequate collection of ascites localized by ultrasound in LEFT lower quadrant. Skin prepped and draped in usual sterile fashion. Skin and soft tissues anesthetized with 10 mL of 1% lidocaine. 5 [place] Yueh catheter placed into peritoneal cavity. 2300 mL of yellow ascites aspirated by vacuum bottle suction. Procedure tolerated well by patient without immediate complication. COMPLICATIONS: None. FINDINGS: A total of approximately 2300 mL of ascitic fluid was removed. A fluid sample of 180 mL was sent for laboratory analysis. IMPRESSION: Successful ultrasound guided paracentesis  yielding 2300 mL of ascites. Electronically Signed   By: Lavonia Dana M.D.   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 Cr continues to improve. Ammonia level improved. Clinically, he has shown significant improvement.    Plan: 1. Titrate lactulose to achieve 2-3 soft stools daily. Discussed at length with patient.  2. Continue Xifaxan BID. 3. Will follow peripherally and plan on follow ov next month.  Laureen Ochs. Bernarda Caffey [facility] [phone] 1/18/201710:13 AM     LOS: 7 days

## 2015-08-29 NOTE — Progress Notes (Signed)
Pt was d/c'd home accompanied by spouse.  F/U appts were arranged with [place] and [place].  Pt is aware and voices understanding.  A list of medications was made available to patient and spouse.  They were informed that two medications were called into [facility] in [place], [place].  All questions and concerns were addressed.

## 2015-08-30 ENCOUNTER — Ambulatory Visit: Payer: Medicare Other | Admitting: Family Medicine

## 2015-08-30 ENCOUNTER — Other Ambulatory Visit: Payer: Self-pay

## 2015-08-30 MED ORDER — OMEPRAZOLE 20 MG PO CPDR: 40.0000 mg | DELAYED_RELEASE_CAPSULE | Freq: Every day | ORAL | Status: AC

## 2015-08-30 MED ORDER — GLIPIZIDE 10 MG PO TABS: ORAL_TABLET | ORAL | Status: DC

## 2015-08-30 MED ORDER — CITALOPRAM HYDROBROMIDE 20 MG PO TABS: 20.0000 mg | ORAL_TABLET | Freq: Every day | ORAL | Status: DC

## 2015-08-31 ENCOUNTER — Encounter: Payer: Self-pay | Admitting: Gastroenterology

## 2015-08-31 ENCOUNTER — Encounter (HOSPITAL_COMMUNITY): Payer: Medicare Other | Attending: Oncology | Admitting: Oncology

## 2015-08-31 ENCOUNTER — Encounter (HOSPITAL_COMMUNITY): Payer: Medicare Other

## 2015-08-31 ENCOUNTER — Encounter: Payer: Self-pay | Admitting: Family Medicine

## 2015-08-31 ENCOUNTER — Encounter (HOSPITAL_COMMUNITY): Payer: Self-pay | Admitting: Oncology

## 2015-08-31 ENCOUNTER — Encounter: Payer: Self-pay | Admitting: *Deleted

## 2015-08-31 ENCOUNTER — Other Ambulatory Visit: Payer: Self-pay | Admitting: *Deleted

## 2015-08-31 ENCOUNTER — Ambulatory Visit (INDEPENDENT_AMBULATORY_CARE_PROVIDER_SITE_OTHER): Payer: Medicare Other | Admitting: Family Medicine

## 2015-08-31 VITALS — BP 148/82 | HR 94 | Temp 97.4°F | Ht 66.0 in | Wt 300.0 lb

## 2015-08-31 DIAGNOSIS — D61818 Other pancytopenia: Secondary | ICD-10-CM | POA: Insufficient documentation

## 2015-08-31 DIAGNOSIS — Z9119 Patient's noncompliance with other medical treatment and regimen: Secondary | ICD-10-CM | POA: Diagnosis not present

## 2015-08-31 DIAGNOSIS — K7030 Alcoholic cirrhosis of liver without ascites: Secondary | ICD-10-CM | POA: Diagnosis not present

## 2015-08-31 DIAGNOSIS — R0602 Shortness of breath: Secondary | ICD-10-CM

## 2015-08-31 DIAGNOSIS — D509 Iron deficiency anemia, unspecified: Secondary | ICD-10-CM | POA: Diagnosis not present

## 2015-08-31 DIAGNOSIS — Z09 Encounter for follow-up examination after completed treatment for conditions other than malignant neoplasm: Secondary | ICD-10-CM | POA: Diagnosis not present

## 2015-08-31 DIAGNOSIS — Z91199 Patient's noncompliance with other medical treatment and regimen due to unspecified reason: Secondary | ICD-10-CM

## 2015-08-31 DIAGNOSIS — R8290 Unspecified abnormal findings in urine: Secondary | ICD-10-CM

## 2015-08-31 LAB — CBC
HCT: 28.8 % — ABNORMAL LOW (ref 39.0–52.0)
Hemoglobin: 9.9 g/dL — ABNORMAL LOW (ref 13.0–17.0)
MCH: 32.8 pg (ref 26.0–34.0)
MCHC: 34.4 g/dL (ref 30.0–36.0)
MCV: 95.4 fL (ref 78.0–100.0)
PLATELETS: 77 10*3/uL — AB (ref 150–400)
RBC: 3.02 MIL/uL — ABNORMAL LOW (ref 4.22–5.81)
RDW: 16.3 % — AB (ref 11.5–15.5)
WBC: 4.6 10*3/uL (ref 4.0–10.5)

## 2015-08-31 LAB — POCT URINALYSIS DIPSTICK
BILIRUBIN UA: NEGATIVE
Glucose, UA: NEGATIVE
KETONES UA: NEGATIVE
Nitrite, UA: NEGATIVE
PROTEIN UA: NEGATIVE
Spec Grav, UA: 1.015
Urobilinogen, UA: NEGATIVE
pH, UA: 6

## 2015-08-31 LAB — POCT UA - MICROSCOPIC ONLY
BACTERIA, U MICROSCOPIC: NEGATIVE
CASTS, UR, LPF, POC: NEGATIVE
Crystals, Ur, HPF, POC: NEGATIVE
MUCUS UA: NEGATIVE
Yeast, UA: NEGATIVE

## 2015-08-31 LAB — FERRITIN: Ferritin: 207 ng/mL (ref 24–336)

## 2015-08-31 LAB — GLUCOSE, CAPILLARY: Glucose-Capillary: 161 mg/dL — ABNORMAL HIGH (ref 65–99)

## 2015-08-31 MED ORDER — INSULIN PEN NEEDLE 32G X 4 MM MISC: 1.0000 "application " | Freq: Every day | Status: AC

## 2015-08-31 MED ORDER — HYDROXYZINE HCL 10 MG PO TABS: ORAL_TABLET | ORAL | Status: DC

## 2015-08-31 MED ORDER — CIPROFLOXACIN HCL 500 MG PO TABS: 500.0000 mg | ORAL_TABLET | Freq: Two times a day (BID) | ORAL | Status: DC

## 2015-08-31 MED ORDER — OXYCODONE HCL 10 MG PO TABS: 10.0000 mg | ORAL_TABLET | Freq: Four times a day (QID) | ORAL | Status: DC | PRN

## 2015-08-31 NOTE — Assessment & Plan Note (Signed)
Multiple missed and rescheduled appointments.  Poor GI compliance.  H/O of cocaine abuse. 

## 2015-08-31 NOTE — Patient Outreach (Addendum)
08/31/15- Pt discharged from hospital on [id number] with complications of cirrhosis. Telephone call for transition of care week 1, spoke with patient's wife Karlyn Agee, HIPAA verified, [name] reports " we're at cancer center for blood work and see MD, then we're going to primary care MD this afternoon at 4 pm"   [name] does not have discharge summary or medications with her but verbalizes that pt does have discharge summary and all medications and taking as prescribed,  [name] reports " we need your pharmacist to help [place] fill out the paperwork for xifaxin, he has samples right now"  [name] states pt has no edema at present and weight is 294 pounds, pt is weighing daily,  Home health to see pt today, RN CM emphasized action plan and importance of calling MD early for any change in health status, symptoms.  CBG has been "in low 100's" per wife.  RN CM sent In Basket to pharmacist [place] pt needs assistance with xifaxin paperwork. RN CM faxed barrier letter and today's transition of care note to primary MD Dr. Sabra Heck.  Outpatient Encounter Prescriptions as of 08/31/2015  Medication Sig Note  . albuterol (PROVENTIL HFA;VENTOLIN HFA) 108 (90 BASE) MCG/ACT inhaler Inhale 2 puffs into the lungs every 6 (six) hours as needed. Shortness of breath   . citalopram (CELEXA) 20 MG tablet Take 1 tablet (20 mg total) by mouth daily.   . cyclobenzaprine (FLEXERIL) 5 MG tablet Take 1 tablet (5 mg total) by mouth at bedtime.   . furosemide (LASIX) 40 MG tablet Take 2 tablets (80 mg total) by mouth 2 (two) times daily.   Marland Kitchen glipiZIDE (GLUCOTROL) 10 MG tablet TAKE 1 TABLET (10 MG TOTAL) BY MOUTH DAILY.   . hydrocerin (EUCERIN) CREA Apply 1 application topically 3 (three) times daily as needed (for itching/dry skin).   . hydrOXYzine (ATARAX/VISTARIL) 10 MG tablet TAKE 1 TABLET (10 MG TOTAL) BY MOUTH EVERY 4 (FOUR) HOURS AS NEEDED FOR ITCHING.   . Insulin Glargine (LANTUS SOLOSTAR) 100 UNIT/ML Solostar Pen Inject 10 Units  into the skin at bedtime.   Marland Kitchen lactulose (CHRONULAC) 10 GM/15ML solution Take 15 mLs (10 g total) by mouth daily. TAKE 15 MLS (10 G TOTAL) BY MOUTH 3 (THREE) TIMES DAILY.   Marland Kitchen LORazepam (ATIVAN) 0.5 MG tablet Take 1 tablet (0.5 mg total) by mouth 2 (two) times daily as needed for anxiety.   . magnesium oxide (MAG-OX) 400 (241.3 MG) MG tablet Take 1 tablet (400 mg total) by mouth 2 (two) times daily.   . metolazone (ZAROXOLYN) 5 MG tablet Take 1 tablet (5 mg total) by mouth 2 (two) times daily.   Marland Kitchen omeprazole (PRILOSEC) 20 MG capsule Take 2 capsules (40 mg total) by mouth daily.   . Oxycodone HCl 10 MG TABS Take 1 tablet (10 mg total) by mouth every 6 (six) hours as needed. (Patient taking differently: Take 10 mg by mouth every 6 (six) hours as needed (pain). )   . rifaximin (XIFAXAN) 550 MG TABS tablet Take 1 tablet (550 mg total) by mouth 2 (two) times daily. 08/22/2015: Need refill   . spironolactone (ALDACTONE) 50 MG tablet Take 1 tablet (50 mg total) by mouth daily. (Patient taking differently: Take 50 mg by mouth 3 (three) times a week. Monday,Wednesday,Friday)   . torsemide (DEMADEX) 20 MG tablet Take 2 tablets (40 mg total) by mouth daily.    No facility-administered encounter medications on file as of 08/31/2015.    [place]  Problem One        Most Recent Value   Care Plan Problem One  Pt high risk for hospitalization related to disease process   Role Documenting the Problem One  Care Management [place] for Problem One  Active   [facility] Long Term Goal (31-90 days)  pt will have no hospitalizations within 90 days.   [facility] Long Term Goal Start Date  08/31/15 Barrie Folk restarted- pt hospitalized]   Interventions for Problem One Long Term Goal  RN CM reviewed with wife of taking medications as prescribed.  RN CM reviewed action plan and to report change in health status such as edema in belly, lower extremities, confusion to MD immediately, reviewed other resources (RN CM, home  health and nurse on call,  24 hour nurse line, [phone])   [facility] CM Short Term Goal #1 (0-30 days)  pt will report increase endurance within 30 days.   [facility] CM Short Term Goal #1 Start Date  08/31/15 Barrie Folk restarted- pt hospitalized]   Interventions for Short Term Goal #1  RN CM confirmed home health PT will be working with pt   [facility] CM Short Term Goal #2 (0-30 days)  pt will verbalize improvement with issue of cramping within 30 days.   [facility] CM Short Term Goal #2 Start Date  08/31/15 [goal restarted]   Interventions for Short Term Goal #2  RN CM encouraged wife to discuss issue of cramping with primary care MD at today's visit     PLAN Follow up with home visit next week on 09/06/15-  Transition of care week [facility] [place] Coordinator [phone]

## 2015-08-31 NOTE — Telephone Encounter (Signed)
APPT MADE AND LETTER SENT  °

## 2015-08-31 NOTE — Progress Notes (Signed)
Subjective:    Patient ID: [name], male    DOB: 1960-08-17, 55 y.o.   MRN: [id number]  HPI Patient here today for hospital follow up from [facility] where he was admitted for general abdominal pain. Ascites was such that he had gained up to 360 pounds by history a large amount of ascitic fluid was drained with relief of pain as well as easing his shortness of breath. In addition to the ascites he developed renal insufficiency. Today he feels much better. Weight is 300 pounds. Urinary catheter was removed after hospitalization and today he has an odor to the urine which is new. During the hospitalization Lasix was discontinued in favor of Demadex. I explained that these are very similar. He also uses spironolactone every other day for fluid. I stressed importance of weighing daily with the use of diuretics to can keep the accumulation of ascitic fluid and third spacing down.      Patient Active Problem List   Diagnosis Date Noted  . Acute renal failure superimposed on stage 3 chronic kidney disease ([place]) 08/24/2015  . Elevated bilirubin   . Generalized abdominal pain   . Renal insufficiency   . Palliative care encounter   . DNR (do not resuscitate) discussion   . Chronic liver disease and cirrhosis ([place])   . Leg pain, lateral   . Cellulitis of right lower extremity   . Anasarca 07/02/2015  . Morbid obesity ([place]) 07/02/2015  . Thrombocytopenia ([place]) 06/13/2015  . Cellulitis 06/11/2015  . Cellulitis of right leg 06/11/2015  . Esophageal varices ([place]) 05/31/2015  . Obesity, Class III, BMI 40-49.9 (morbid obesity) ([place]) 05/31/2015  . Medically noncompliant 05/30/2015  . Abnormal LFTs   . Scrotal edema 01/16/2015  . Ascites 01/15/2015  . Traumatic ecchymosis of chest 10/26/2014  . Chronic hepatitis C with cirrhosis ([place])   . Diabetes ([place]) 07/31/2014  . Encephalopathy, hepatic ([place]) 01/29/2014  . Angiodysplasia of colon 12/27/2013  . Hematochezia 11/03/2013  . Other  pancytopenia ([place]) 02/18/2013  . Iron (Fe) deficiency anemia 02/18/2013  . Colon cancer screening 08/28/2011  . DM 02/15/2010  . GERD 10/25/2009  . Hepatic cirrhosis ([place]) 10/25/2009  . ALCOHOL ABUSE 06/05/2009  . UNSPECIFIED DISEASE OF PANCREAS 06/05/2009   Outpatient Encounter Prescriptions as of 08/31/2015  Medication Sig  . albuterol (PROVENTIL HFA;VENTOLIN HFA) 108 (90 BASE) MCG/ACT inhaler Inhale 2 puffs into the lungs every 6 (six) hours as needed. Shortness of breath  . citalopram (CELEXA) 20 MG tablet Take 1 tablet (20 mg total) by mouth daily.  . cyclobenzaprine (FLEXERIL) 5 MG tablet Take 1 tablet (5 mg total) by mouth at bedtime.  . furosemide (LASIX) 40 MG tablet Take 2 tablets (80 mg total) by mouth 2 (two) times daily.  Marland Kitchen glipiZIDE (GLUCOTROL) 10 MG tablet TAKE 1 TABLET (10 MG TOTAL) BY MOUTH DAILY.  . hydrocerin (EUCERIN) CREA Apply 1 application topically 3 (three) times daily as needed (for itching/dry skin).  . hydrOXYzine (ATARAX/VISTARIL) 10 MG tablet TAKE 1 TABLET (10 MG TOTAL) BY MOUTH EVERY 4 (FOUR) HOURS AS NEEDED FOR ITCHING.  . Insulin Glargine (LANTUS SOLOSTAR) 100 UNIT/ML Solostar Pen Inject 10 Units into the skin at bedtime.  Marland Kitchen lactulose (CHRONULAC) 10 GM/15ML solution Take 15 mLs (10 g total) by mouth daily. TAKE 15 MLS (10 G TOTAL) BY MOUTH 3 (THREE) TIMES DAILY.  Marland Kitchen LORazepam (ATIVAN) 0.5 MG tablet Take 1 tablet (0.5 mg total) by mouth 2 (two) times daily as needed for  anxiety.  . magnesium oxide (MAG-OX) 400 (241.3 MG) MG tablet Take 1 tablet (400 mg total) by mouth 2 (two) times daily.  . metolazone (ZAROXOLYN) 5 MG tablet Take 1 tablet (5 mg total) by mouth 2 (two) times daily.  Marland Kitchen omeprazole (PRILOSEC) 20 MG capsule Take 2 capsules (40 mg total) by mouth daily.  . Oxycodone HCl 10 MG TABS Take 1 tablet (10 mg total) by mouth every 6 (six) hours as needed. (Patient taking differently: Take 10 mg by mouth every 6 (six) hours as needed (pain). )  . rifaximin  (XIFAXAN) 550 MG TABS tablet Take 1 tablet (550 mg total) by mouth 2 (two) times daily.  Marland Kitchen spironolactone (ALDACTONE) 50 MG tablet Take 1 tablet (50 mg total) by mouth daily. (Patient taking differently: Take 50 mg by mouth 3 (three) times a week. Monday,Wednesday,Friday)  . torsemide (DEMADEX) 20 MG tablet Take 2 tablets (40 mg total) by mouth daily.   No facility-administered encounter medications on file as of 08/31/2015.     Review of Systems  Constitutional: Negative.   HENT: Negative.   Eyes: Negative.   Respiratory: Positive for shortness of breath.   Cardiovascular: Negative.   Gastrointestinal: Positive for abdominal pain.  Endocrine: Negative.   Genitourinary: Negative.   Musculoskeletal: Negative.   Skin: Positive for color change (yellow).  Allergic/Immunologic: Negative.   Neurological: Negative.   Hematological: Negative.   Psychiatric/Behavioral: Negative.        Objective:   Physical Exam  Constitutional: He is oriented to person, place, and time. He appears well-developed and well-nourished.  Cardiovascular: Normal rate, regular rhythm and normal heart sounds.   Pulmonary/Chest: Effort normal and breath sounds normal.  Abdominal: Soft. Bowel sounds are normal. There is no tenderness.  Neurological: He is alert and oriented to person, place, and time.          Assessment & Plan:  1. SOB (shortness of breath) Breathing is much easier since ascitic fluid has been withdrawn. - POCT urinalysis dipstick - POCT UA - Microscopic Only - Urine culture - BMP8+EGFR  2. Hospital discharge follow-up Patient had been hospitalized at [facility] from 1-11 to 1-18. This is one of several recent hospitalizations all related to his chronic liver disease. - POCT urinalysis dipstick - POCT UA - Microscopic Only - Urine culture - BMP8+EGFR - Hepatic function panel  3. Abnormal urine odor Patient had had urinary catheter in the hospital there are red cells and white  cells in urine today. With odor culture is indicated and will begin Cipro pending result of culture - POCT urinalysis dipstick - POCT UA - Microscopic Only - Urine culture - BMP8+EGFR  [name] MD

## 2015-08-31 NOTE — Patient Instructions (Signed)
.  [place] at [facility] Discharge Instructions  RECOMMENDATIONS MADE BY THE CONSULTANT AND ANY TEST RESULTS WILL BE SENT TO YOUR REFERRING PHYSICIAN.  Labs 3 months and 6 months  Return in 6 months Thank you for choosing [place] at [facility] to provide your oncology and hematology care.  To afford each patient quality time with our provider, please arrive at least 15 minutes before your scheduled appointment time.   Beginning January 23rd 2017 lab work for the [facility] will be done in the  Main lab at [facility] on 1st floor. If you have a lab appointment with the [facility] please come in thru the  Main Entrance and check in at the main information desk  You need to re-schedule your appointment should you arrive 10 or more minutes late.  We strive to give you quality time with our providers, and arriving late affects you and other patients whose appointments are after yours.  Also, if you no show three or more times for appointments you may be dismissed from the clinic at the providers discretion.     Again, thank you for choosing [facility].  Our hope is that these requests will decrease the amount of time that you wait before being seen by our physicians.       _____________________________________________________________  Should you have questions after your visit to [facility], please contact our office at [phone] between the hours of 8:30 a.m. and 4:30 p.m.  Voicemails left after 4:30 p.m. will not be returned until the following business day.  For prescription refill requests, have your pharmacy contact our office.

## 2015-08-31 NOTE — Assessment & Plan Note (Addendum)
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 His cirrhosis is of non-alcohol-related and he has been evaluated in [place], [place] for possible transplant.    Labs today: CBC diff, CMET, iron/TIBC, ferritin  Labs in 3 months: CBC diff, iron/TIBC, ferritin  Labs in 6 months: CBC diff, CMET, iron/TIBC, ferritin  Return in 6 months for follow-up.

## 2015-08-31 NOTE — Progress Notes (Signed)
[name], [place] [address]  Other pancytopenia [facility]) - Plan: CBC with Differential  Iron (Fe) deficiency anemia - Plan: CBC with Differential, Iron and TIBC, Ferritin  Alcoholic cirrhosis of liver without ascites ([place])  Medically noncompliant  CURRENT THERAPY: Surveillance of blood counts and administration of IV iron as needed.  INTERVAL HISTORY: [name] 55 y.o. male returns for followup of pancytopenia secondary to cirrhosis of the liver with iron deficiency requiring intermittent IV iron. His cirrhosis of non-alcohol-related and he has been evaluated in [place], [place] for possible transplant. There is a history of left nephrolithiasis as well as cholelithiasis. Due to his severe underlying liver disease, cholecystectomy has not been done.   I personally reviewed and went over laboratory results with the patient.  The results are noted within this dictation.  Labs will be updated today.  Labs thus far are at baseline. Ferritin is pending.  Chart reviewed.  Hospital admissions reviewed.  Most recent hospitalization was for decompensated cirrhosis with ascites.  This is followed by GI.  He is on diuretics and lactulose, managed by primary care provider.  He denies any bleeding or infections.  Hematologically, he denies any complaints and ROS questioning is negative.   Past Medical History  Diagnosis Date  . Hypertension   . Diabetes mellitus   . GERD (gastroesophageal reflux disease)     DEC 2010 EGD/Bx REACTIVE GASTROPATHY, NO VARICES  . Hemorrhoids, internal   . BMI 40.0-44.9, adult ([place]) OCT 2010 269 LBS    APR 2012 279 LBS AUG 2014 185 LBS  . Cirrhosis ([place]) NOV 2010 CHILD PUGH A    ETOH/HCV/OBESITY  . IV drug abuse REMOTE  . Hepatitis 2010 HEP C    AST 509 ALT 267 ALK PHOS 165 ALB 3.8 NEG IGM HAV/HBSAg.   . Gallstone AUG 2012 1 CM  . GERD 10/25/2009  . COPD (chronic obstructive pulmonary disease) ([place])   .  Hepatitis C     failed interferon/ribavirin. treated with ribavirin/sofosbuvir/declastasvir 2016, ?failure or early relapse unable to determine because patient was noncompliant.  . Pancytopenia ([place]) 2013  . Splenomegaly 2013  . Other pancytopenia ([place]) 02/18/2013  . Iron (Fe) deficiency anemia 02/18/2013  . Splenomegaly 02/18/2013  . Neuropathy ([place])   . Biliary dyskinesia JUL 2015    HIDA GB EF 5%  . Chronic pain   . Chronic leg pain     left  . Portal venous hypertension (HCC)   . Asthma   . Medically noncompliant 05/30/2015  . Renal insufficiency     has DM; ALCOHOL ABUSE; GERD; Hepatic cirrhosis ([place]); UNSPECIFIED DISEASE OF PANCREAS; Colon cancer screening; Other pancytopenia ([place]); Iron (Fe) deficiency anemia; Hematochezia; Angiodysplasia of colon; Encephalopathy, hepatic ([place]); Diabetes ([place]); Chronic hepatitis C with cirrhosis ([place]); Traumatic ecchymosis of chest; Ascites; Scrotal edema; Abnormal LFTs; Medically noncompliant; Esophageal varices ([place]); Obesity, Class III, BMI 40-49.9 (morbid obesity) ([place]); Cellulitis; Cellulitis of right leg; Thrombocytopenia ([place]); Anasarca; Morbid obesity ([place]); Leg pain, lateral; Cellulitis of right lower extremity; Chronic liver disease and cirrhosis ([place]); Palliative care encounter; DNR (do not resuscitate) discussion; Renal insufficiency; Acute renal failure superimposed on stage 3 chronic kidney disease ([place]); Elevated bilirubin; and Generalized abdominal pain on his problem list.     is allergic to penicillins.  [name] does not currently have medications on file.  Past Surgical History  Procedure Laterality Date  . Sigmoidoscopy  2001 DR. FLEISCHMAN INTERNAL HERMORRHOIDS  . Upper gastrointestinal endoscopy  DEC 2010    BENIGN POLYPS, GASTRITIS, ?phg  . Knee surgery  RIGHT  . Hemorrhoid surgery    . Esophageal biopsy  09/08/2011    [name]:Moderate gastritis/Polyps, multiple in the body of the stomach  . Colonoscopy with  propofol N/A 11/15/2013    [name]: rectal varices, small AVMs  . Esophagogastroduodenoscopy (egd) with propofol N/A 11/15/2013    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 Next EGD in April 2016.      Denies any headaches, dizziness, double vision, fevers, chills, night sweats, nausea, vomiting, diarrhea, constipation, chest pain, heart palpitations, shortness of breath, blood in stool, black tarry stool, urinary pain, urinary burning, urinary frequency, hematuria.   PHYSICAL EXAMINATION  ECOG PERFORMANCE STATUS: 0 - Asymptomatic  There were no vitals filed for this visit.  GENERAL:alert, no distress, well nourished, well developed, comfortable, cooperative, obese and smiling, accompanied by his wife. SKIN: skin color, texture, turgor are normal, no rashes or significant lesions HEAD: Normocephalic, No masses, lesions, tenderness or abnormalities EYES: normal, PERRLA, EOMI, Conjunctiva are pink and non-injected EARS: External ears normal OROPHARYNX:lips, buccal mucosa, and tongue normal and mucous membranes are moist  NECK: supple, no adenopathy, trachea midline LYMPH:  no palpable lymphadenopathy BREAST: Not examined LUNGS: clear to auscultation and percussion HEART: regular rate & rhythm, no gallops, S1 normal, S2 normal and 1/6 systolic ejection murmur heard best at LSB. ABDOMEN:abdomen soft, non-tender and obese BACK: Back symmetric, no curvature. EXTREMITIES:less then 2 second capillary refill, no joint deformities, effusion, or inflammation, no skin discoloration, no cyanosis  NEURO: alert & oriented x 3 with fluent speech, no focal motor/sensory deficits, gait normal   LABORATORY DATA: CBC    Component Value Date/Time   WBC 4.6 08/31/2015 0957   WBC 3.7 04/10/2015 1609   RBC 3.02* 08/31/2015 0957   RBC 3.71* 04/10/2015 1609   HGB 9.9* 08/31/2015 0957   HCT 28.8* 08/31/2015 0957   HCT 35.7* 04/10/2015 1609     PLT 77* 08/31/2015 0957   PLT 57* 04/10/2015 1609   MCV 95.4 08/31/2015 0957   MCV 96 04/10/2015 1609   MCH 32.8 08/31/2015 0957   MCH 32.6 04/10/2015 1609   MCHC 34.4 08/31/2015 0957   MCHC 33.9 04/10/2015 1609   RDW 16.3* 08/31/2015 0957   RDW 13.8 04/10/2015 1609   LYMPHSABS 1.4 08/22/2015 1729   LYMPHSABS 1.0 04/10/2015 1609   MONOABS 1.0 08/22/2015 1729   EOSABS 0.1 08/22/2015 1729   EOSABS 0.1 04/10/2015 1609   BASOSABS 0.0 08/22/2015 1729   BASOSABS 0.0 04/10/2015 1609      Chemistry      Component Value Date/Time   NA 134* 08/29/2015 0704   NA CANCELED 07/20/2015 1130   K 3.6 08/29/2015 0704   CL 99* 08/29/2015 0704   CO2 28 08/29/2015 0704   BUN 54* 08/29/2015 0704   BUN CANCELED 07/20/2015 1130   CREATININE 1.75* 08/29/2015 0704   CREATININE 0.67 07/19/2012 0823      Component Value Date/Time   CALCIUM 8.2* 08/29/2015 0704   ALKPHOS 97 08/27/2015 0910   AST 118* 08/27/2015 0910   ALT 45 08/27/2015 0910   BILITOT 4.4* 08/27/2015 0910     Lab Results  Component Value Date   IRON 110 05/31/2015   TIBC 269 05/31/2015   FERRITIN 110 05/31/2015     ASSESSMENT AND PLAN:  Other pancytopenia Pancytopenia secondary to cirrhosis of the liver with an element of iron deficiency requiring IV iron infrequently. His cirrhosis is of non-alcohol-related and he has been evaluated in [place], [place] for possible transplant.    Labs today: CBC diff, CMET, iron/TIBC, ferritin  Labs in 3 months: CBC diff, iron/TIBC, ferritin  Labs in 6 months: CBC diff, CMET, iron/TIBC, ferritin  Return in 6 months for follow-up.  Iron (Fe) deficiency anemia Iron deficiency anemia requiring infrequent IV iron.  Oncology Flowsheet 11/09/2014 11/16/2014  ferumoxytol (FERAHEME) IV 510 mg 510 mg    Labs today, in 3 months, and 6 months: iron/TIBC, ferritin.  Hepatic cirrhosis ([place]) Followed by GI, [name].  Would benefit from code status establishment and continued  discussion regarding goals of care.  Medically noncompliant Multiple missed and rescheduled appointments.  Poor GI compliance.  H/O of cocaine abuse.  THERAPY PLAN:  We will continue to monitor labs and provide iron as indicated.    All questions were answered. The patient knows to call the clinic with any problems, questions or concerns. We can certainly see the patient much sooner if necessary.  Patient and plan discussed with Dr. Ancil Linsey and she is in agreement with the aforementioned.   This note is electronically signed by: Robynn Pane 08/31/2015 10:58 AM

## 2015-08-31 NOTE — Assessment & Plan Note (Signed)
Followed by GI, [name].  Would benefit from code status establishment and continued discussion regarding goals of care.

## 2015-08-31 NOTE — Assessment & Plan Note (Addendum)
Iron deficiency anemia requiring infrequent IV iron.  Oncology Flowsheet 11/09/2014 11/16/2014  ferumoxytol (FERAHEME) IV 510 mg 510 mg    Labs today, in 3 months, and 6 months: iron/TIBC, ferritin.

## 2015-09-01 LAB — BMP8+EGFR
BUN/Creatinine Ratio: 21 — ABNORMAL HIGH (ref 9–20)
BUN: 41 mg/dL — AB (ref 6–24)
CALCIUM: 8.2 mg/dL — AB (ref 8.7–10.2)
CO2: 26 mmol/L (ref 18–29)
CREATININE: 1.99 mg/dL — AB (ref 0.76–1.27)
Chloride: 93 mmol/L — ABNORMAL LOW (ref 96–106)
GFR calc Af Amer: 43 mL/min/{1.73_m2} — ABNORMAL LOW (ref >59)
GFR, EST NON AFRICAN AMERICAN: 37 mL/min/{1.73_m2} — AB (ref >59)
Glucose: 220 mg/dL — ABNORMAL HIGH (ref 65–99)
POTASSIUM: 3.9 mmol/L (ref 3.5–5.2)
Sodium: 136 mmol/L (ref 134–144)

## 2015-09-01 LAB — HEPATIC FUNCTION PANEL
ALBUMIN: 2.9 g/dL — AB (ref 3.5–5.5)
ALK PHOS: 105 IU/L (ref 39–117)
ALT: 41 IU/L (ref 0–44)
AST: 95 IU/L — ABNORMAL HIGH (ref 0–40)
BILIRUBIN TOTAL: 3 mg/dL — AB (ref 0.0–1.2)
Bilirubin, Direct: 1.97 mg/dL — ABNORMAL HIGH (ref 0.00–0.40)
Total Protein: 6.3 g/dL (ref 6.0–8.5)

## 2015-09-02 LAB — URINE CULTURE

## 2015-09-03 ENCOUNTER — Telehealth: Payer: Self-pay | Admitting: Family Medicine

## 2015-09-03 MED ORDER — NITROFURANTOIN MONOHYD MACRO 100 MG PO CAPS: 100.0000 mg | ORAL_CAPSULE | Freq: Two times a day (BID) | ORAL | Status: DC

## 2015-09-03 NOTE — Telephone Encounter (Signed)
Stp's wife and advised rx sent to pharmacy.

## 2015-09-03 NOTE — Progress Notes (Signed)
Patient aware. Call in rx please

## 2015-09-04 ENCOUNTER — Telehealth: Payer: Self-pay | Admitting: Family Medicine

## 2015-09-04 NOTE — Telephone Encounter (Signed)
Since he already has lorazepam, I would double up on that to take at night to help him sleep

## 2015-09-04 NOTE — Telephone Encounter (Signed)
Pt & wife both state that the lorazepam does not help him, it keeps him up.

## 2015-09-04 NOTE — Telephone Encounter (Signed)
Okay list try Ambien 10 mg at bedtime #15

## 2015-09-04 NOTE — Telephone Encounter (Signed)
Patient aware, Ambein 10 mg, take at bedtime, QTY 15, o refills called to [facility] voice mail per Dr. Sabra Heck.

## 2015-09-05 MED ORDER — ZOLPIDEM TARTRATE 10 MG PO TABS: 10.0000 mg | ORAL_TABLET | Freq: Every evening | ORAL | Status: DC | PRN

## 2015-09-05 NOTE — Telephone Encounter (Signed)
TC to pt, who picked up Ambien last night that was called in to pharmacy. He took 1/2 tab & slept better last pm

## 2015-09-06 ENCOUNTER — Ambulatory Visit: Payer: Self-pay | Admitting: *Deleted

## 2015-09-06 ENCOUNTER — Telehealth: Payer: Self-pay | Admitting: Gastroenterology

## 2015-09-06 ENCOUNTER — Other Ambulatory Visit: Payer: Self-pay | Admitting: *Deleted

## 2015-09-06 NOTE — Telephone Encounter (Signed)
REVIEWED-NO ADDITIONAL RECOMMENDATIONS. 

## 2015-09-06 NOTE — Telephone Encounter (Signed)
I called pt's wife, [name], and she said she rescheduled the urologist appt til 09/14/2015. She requested samples of the Xifaxin and I am leaving #14 tablets at front for her to pick up when she returns the paperwork for the patient assistance. She is aware.

## 2015-09-06 NOTE — Telephone Encounter (Signed)
Wife [name]) called to ask [place] what the number was to the Urologist, because he has appt there today and he can't go because his uncle died last night. I gave her the number to call. She asked about getting more Liver Medicine samples because he was about to run out and she is trying to get assistance on his medications. Please advise and call [phone]

## 2015-09-06 NOTE — Patient Outreach (Signed)
09/06/15- Telephone call to remind pt and wife of scheduled home visit today, spoke with wife Solace Galetti who reports " he had a death in his family and he won't be home all day, then he's got all the funeral stuff to do"  [facility] requests visit be rescheduled, home visit rescheduled for 09/18/15 to accommodate patient and wife's schedule (wife works and would like to be present for the visit)  Wife reports patient's weight is 297 pounds, pt saw Dr. Sabra Heck last week, CBG 109 today and "readings have been good", RN CM reiterated fluid restrictions 1200 cc and low sodium, carb modified diet, wife states "we're doing all of that", home health continues.  RN CM sent In Basket to [facility] and informed today's visit cancelled.  THN CM Care Plan Problem One        Most Recent Value   Care Plan Problem One  Pt high risk for hospitalization related to disease process   Role Documenting the Problem One  Care Management Coordinator   Care Plan for Problem One  Active   THN Long Term Goal (31-90 days)  pt will have no hospitalizations within 90 days.   THN Long Term Goal Start Date  08/31/15 Barrie Folk restarted- pt hospitalized]   Interventions for Problem One Long Term Goal  RN CM reinforced action plan with wife and importance of taking medications as prescribed.  RN CM reviewed importance of reporting change in health status such as edema in belly, lower extremities, confusion to MD immediately, reviewed other resources (RN CM, home health and nurse on call,  24 hour nurse line, 911)   THN CM Short Term Goal #1 (0-30 days)  pt will report increase endurance within 30 days.   THN CM Short Term Goal #1 Start Date  08/31/15 [goal restarted- pt hospitalized]   THN CM Short Term Goal #2 (0-30 days)  pt will verbalize improvement with issue of cramping within 30 days.   THN CM Short Term Goal #2 Start Date  08/31/15 [goal restarted]      PLAN Continue weekly transition of care calls Follow up with home  visit 09/18/15  [name] [facility], [place] Coordinator [phone]

## 2015-09-10 ENCOUNTER — Other Ambulatory Visit: Payer: Self-pay | Admitting: Family Medicine

## 2015-09-11 ENCOUNTER — Telehealth: Payer: Self-pay | Admitting: Family Medicine

## 2015-09-13 ENCOUNTER — Other Ambulatory Visit: Payer: Self-pay

## 2015-09-13 ENCOUNTER — Telehealth: Payer: Self-pay | Admitting: Gastroenterology

## 2015-09-13 DIAGNOSIS — R188 Other ascites: Secondary | ICD-10-CM

## 2015-09-13 NOTE — Telephone Encounter (Signed)
[name] is aware of PARA in the morning at 8:45 am

## 2015-09-13 NOTE — Telephone Encounter (Signed)
Wife [name]) called to say that the patient is really swollen and can't catch his breath and is up to 340 pounds and needs fluid drawn off today ASAP. Please advise and call her back at [phone]

## 2015-09-13 NOTE — Telephone Encounter (Signed)
PT's wife called and I spoke to her. She said pt can't breathe and I Told her to call 911. Then she said he can breathe, he just can't breathe good. She said she was told at radiology when he gets like that to call them and call [name] for an appt for a PARA. I spoke to [name] and she said OK for [place] to order the PARA.  She also asked for samples of Xifaxin and I told her that I have 3 boxes, (6 tablets) and if she wants him to have assistance getting the medication she needs to complete the patient assistance paperwork and return. She again said that someone is coming to help her tomorrow.

## 2015-09-13 NOTE — Telephone Encounter (Signed)
Talked with [name] in [place] they don't have anything for today but can get him in for a PARA in the morning at 8:45 am. Does he need a labs or albuim? Please advise

## 2015-09-13 NOTE — Telephone Encounter (Signed)
PLEASE CALL PT. EXPLAIN NO APPTS TODAY. HE CAN HAVE A PARACENTESIS TOMORROW. IF HE FEELS HE CANNOT WAIT UNTIL TOMORROW, THEN HE CAN GO TO THE ED. HE SHOULD GO TO THE ED IF HE IS HAVING DIFFICULTY BREATHING.  Large volume paracentesis WITH ALBUMIN 25 GMS IV AT ONSET AND REPEAT AFTER 4 LS. HE SHOULD HOLD HIS DIURETICS ON THE DAY OF THE PARACENTESIS. HE NEEDS TO STRICTLY FOLLOW A LOW SALT DIET. IF HE

## 2015-09-14 ENCOUNTER — Encounter (HOSPITAL_COMMUNITY): Payer: Self-pay

## 2015-09-14 ENCOUNTER — Telehealth: Payer: Self-pay | Admitting: *Deleted

## 2015-09-14 ENCOUNTER — Emergency Department (HOSPITAL_COMMUNITY): Payer: Medicare Other

## 2015-09-14 ENCOUNTER — Other Ambulatory Visit: Payer: Self-pay

## 2015-09-14 ENCOUNTER — Inpatient Hospital Stay (HOSPITAL_COMMUNITY)
Admission: EM | Admit: 2015-09-14 | Discharge: 2015-09-17 | DRG: 442 | Disposition: A | Discharge status: Home Health | Payer: Medicare Other | Attending: Internal Medicine | Admitting: Internal Medicine

## 2015-09-14 ENCOUNTER — Other Ambulatory Visit: Payer: Self-pay | Admitting: *Deleted

## 2015-09-14 DIAGNOSIS — Z809 Family history of malignant neoplasm, unspecified: Secondary | ICD-10-CM | POA: Diagnosis not present

## 2015-09-14 DIAGNOSIS — K7031 Alcoholic cirrhosis of liver with ascites: Secondary | ICD-10-CM | POA: Diagnosis present

## 2015-09-14 DIAGNOSIS — R4182 Altered mental status, unspecified: Secondary | ICD-10-CM | POA: Diagnosis present

## 2015-09-14 DIAGNOSIS — Z66 Do not resuscitate: Secondary | ICD-10-CM | POA: Diagnosis present

## 2015-09-14 DIAGNOSIS — J449 Chronic obstructive pulmonary disease, unspecified: Secondary | ICD-10-CM | POA: Diagnosis present

## 2015-09-14 DIAGNOSIS — K219 Gastro-esophageal reflux disease without esophagitis: Secondary | ICD-10-CM | POA: Diagnosis present

## 2015-09-14 DIAGNOSIS — Z6841 Body Mass Index (BMI) 40.0 and over, adult: Secondary | ICD-10-CM | POA: Diagnosis not present

## 2015-09-14 DIAGNOSIS — J45909 Unspecified asthma, uncomplicated: Secondary | ICD-10-CM | POA: Diagnosis present

## 2015-09-14 DIAGNOSIS — N390 Urinary tract infection, site not specified: Secondary | ICD-10-CM | POA: Diagnosis present

## 2015-09-14 DIAGNOSIS — K7290 Hepatic failure, unspecified without coma: Secondary | ICD-10-CM | POA: Diagnosis not present

## 2015-09-14 DIAGNOSIS — Z794 Long term (current) use of insulin: Secondary | ICD-10-CM

## 2015-09-14 DIAGNOSIS — K766 Portal hypertension: Secondary | ICD-10-CM | POA: Diagnosis present

## 2015-09-14 DIAGNOSIS — E1122 Type 2 diabetes mellitus with diabetic chronic kidney disease: Secondary | ICD-10-CM | POA: Diagnosis present

## 2015-09-14 DIAGNOSIS — R601 Generalized edema: Secondary | ICD-10-CM | POA: Diagnosis present

## 2015-09-14 DIAGNOSIS — E669 Obesity, unspecified: Secondary | ICD-10-CM | POA: Diagnosis present

## 2015-09-14 DIAGNOSIS — F1410 Cocaine abuse, uncomplicated: Secondary | ICD-10-CM

## 2015-09-14 DIAGNOSIS — K7682 Hepatic encephalopathy: Secondary | ICD-10-CM | POA: Diagnosis present

## 2015-09-14 DIAGNOSIS — R188 Other ascites: Secondary | ICD-10-CM | POA: Diagnosis not present

## 2015-09-14 DIAGNOSIS — E119 Type 2 diabetes mellitus without complications: Secondary | ICD-10-CM | POA: Diagnosis not present

## 2015-09-14 DIAGNOSIS — B9620 Unspecified Escherichia coli [E. coli] as the cause of diseases classified elsewhere: Secondary | ICD-10-CM | POA: Diagnosis present

## 2015-09-14 DIAGNOSIS — B1920 Unspecified viral hepatitis C without hepatic coma: Secondary | ICD-10-CM | POA: Diagnosis present

## 2015-09-14 DIAGNOSIS — N183 Chronic kidney disease, stage 3 (moderate): Secondary | ICD-10-CM | POA: Diagnosis present

## 2015-09-14 DIAGNOSIS — I129 Hypertensive chronic kidney disease with stage 1 through stage 4 chronic kidney disease, or unspecified chronic kidney disease: Secondary | ICD-10-CM | POA: Diagnosis present

## 2015-09-14 DIAGNOSIS — K7200 Acute and subacute hepatic failure without coma: Secondary | ICD-10-CM | POA: Diagnosis not present

## 2015-09-14 DIAGNOSIS — R109 Unspecified abdominal pain: Secondary | ICD-10-CM

## 2015-09-14 DIAGNOSIS — G9340 Encephalopathy, unspecified: Secondary | ICD-10-CM

## 2015-09-14 HISTORY — DX: Cocaine abuse, uncomplicated: F14.10

## 2015-09-14 LAB — URINALYSIS, ROUTINE W REFLEX MICROSCOPIC
BILIRUBIN URINE: NEGATIVE
GLUCOSE, UA: NEGATIVE mg/dL
KETONES UR: NEGATIVE mg/dL
Nitrite: NEGATIVE
PH: 5.5 (ref 5.0–8.0)
PROTEIN: 30 mg/dL — AB
Specific Gravity, Urine: 1.01 (ref 1.005–1.030)

## 2015-09-14 LAB — CBC
HCT: 25.6 % — ABNORMAL LOW (ref 39.0–52.0)
Hemoglobin: 8.8 g/dL — ABNORMAL LOW (ref 13.0–17.0)
MCH: 32.7 pg (ref 26.0–34.0)
MCHC: 34.4 g/dL (ref 30.0–36.0)
MCV: 95.2 fL (ref 78.0–100.0)
Platelets: 58 10*3/uL — ABNORMAL LOW (ref 150–400)
RBC: 2.69 MIL/uL — ABNORMAL LOW (ref 4.22–5.81)
RDW: 14.9 % (ref 11.5–15.5)
WBC: 5.4 10*3/uL (ref 4.0–10.5)

## 2015-09-14 LAB — RAPID URINE DRUG SCREEN, HOSP PERFORMED
Amphetamines: NOT DETECTED
Barbiturates: NOT DETECTED
Benzodiazepines: NOT DETECTED
COCAINE: POSITIVE — AB
OPIATES: NOT DETECTED
TETRAHYDROCANNABINOL: NOT DETECTED

## 2015-09-14 LAB — URINE MICROSCOPIC-ADD ON

## 2015-09-14 LAB — SALICYLATE LEVEL

## 2015-09-14 LAB — COMPREHENSIVE METABOLIC PANEL
ALBUMIN: 2.6 g/dL — AB (ref 3.5–5.0)
ALK PHOS: 86 U/L (ref 38–126)
ALT: 37 U/L (ref 17–63)
AST: 88 U/L — AB (ref 15–41)
Anion gap: 11 (ref 5–15)
BILIRUBIN TOTAL: 3 mg/dL — AB (ref 0.3–1.2)
BUN: 53 mg/dL — AB (ref 6–20)
CALCIUM: 8.5 mg/dL — AB (ref 8.9–10.3)
CO2: 26 mmol/L (ref 22–32)
CREATININE: 2.01 mg/dL — AB (ref 0.61–1.24)
Chloride: 102 mmol/L (ref 101–111)
GFR calc Af Amer: 42 mL/min — ABNORMAL LOW (ref >60)
GFR calc non Af Amer: 36 mL/min — ABNORMAL LOW (ref >60)
GLUCOSE: 165 mg/dL — AB (ref 65–99)
Potassium: 3.8 mmol/L (ref 3.5–5.1)
Sodium: 139 mmol/L (ref 135–145)
TOTAL PROTEIN: 6.6 g/dL (ref 6.5–8.1)

## 2015-09-14 LAB — TROPONIN I: Troponin I: <0.03 ng/mL (ref <0.031)

## 2015-09-14 LAB — CBG MONITORING, ED: GLUCOSE-CAPILLARY: 138 mg/dL — AB (ref 65–99)

## 2015-09-14 LAB — ETHANOL: Alcohol, Ethyl (B): <5 mg/dL (ref <5)

## 2015-09-14 LAB — PROTIME-INR
INR: 1.09 (ref 0.00–1.49)
PROTHROMBIN TIME: 14.3 s (ref 11.6–15.2)

## 2015-09-14 LAB — AMMONIA: Ammonia: 65 umol/L — ABNORMAL HIGH (ref 9–35)

## 2015-09-14 LAB — ACETAMINOPHEN LEVEL: Acetaminophen (Tylenol), Serum: <10 ug/mL — ABNORMAL LOW (ref 10–30)

## 2015-09-14 LAB — MAGNESIUM: MAGNESIUM: 1.7 mg/dL (ref 1.7–2.4)

## 2015-09-14 MED ORDER — TORSEMIDE 20 MG PO TABS
40.0000 mg | ORAL_TABLET | Freq: Every day | ORAL | Status: DC
  Administered 2015-09-15 – 2015-09-17 (×3): 40 mg via ORAL
  Filled 2015-09-14 (×3): qty 2

## 2015-09-14 MED ORDER — LACTULOSE 10 GM/15ML PO SOLN
10.0000 g | Freq: Two times a day (BID) | ORAL | Status: DC
  Administered 2015-09-14 – 2015-09-17 (×5): 10 g via ORAL
  Filled 2015-09-14 (×6): qty 30

## 2015-09-14 MED ORDER — PANTOPRAZOLE SODIUM 40 MG PO TBEC
40.0000 mg | DELAYED_RELEASE_TABLET | Freq: Every day | ORAL | Status: DC
  Administered 2015-09-15 – 2015-09-17 (×3): 40 mg via ORAL
  Filled 2015-09-14 (×3): qty 1

## 2015-09-14 MED ORDER — ALBUTEROL SULFATE (2.5 MG/3ML) 0.083% IN NEBU
3.0000 mL | INHALATION_SOLUTION | Freq: Four times a day (QID) | RESPIRATORY_TRACT | Status: DC | PRN
  Administered 2015-09-15: 3 mL via RESPIRATORY_TRACT
  Filled 2015-09-14: qty 3

## 2015-09-14 MED ORDER — DEXTROSE 5 % IV SOLN
1.0000 g | INTRAVENOUS | Status: DC
  Administered 2015-09-14 – 2015-09-16 (×3): 1 g via INTRAVENOUS
  Filled 2015-09-14 (×4): qty 10

## 2015-09-14 MED ORDER — SPIRONOLACTONE 25 MG PO TABS
50.0000 mg | ORAL_TABLET | Freq: Every day | ORAL | Status: DC
  Administered 2015-09-15 – 2015-09-17 (×3): 50 mg via ORAL
  Filled 2015-09-14 (×3): qty 2

## 2015-09-14 MED ORDER — CITALOPRAM HYDROBROMIDE 20 MG PO TABS
20.0000 mg | ORAL_TABLET | Freq: Every day | ORAL | Status: DC
  Administered 2015-09-15 – 2015-09-17 (×3): 20 mg via ORAL
  Filled 2015-09-14 (×3): qty 1

## 2015-09-14 MED ORDER — SODIUM CHLORIDE 0.9 % IV SOLN
INTRAVENOUS | Status: DC
  Administered 2015-09-14: 18:00:00 via INTRAVENOUS

## 2015-09-14 MED ORDER — RIFAXIMIN 550 MG PO TABS
550.0000 mg | ORAL_TABLET | Freq: Two times a day (BID) | ORAL | Status: DC
  Administered 2015-09-14 – 2015-09-17 (×6): 550 mg via ORAL
  Filled 2015-09-14 (×6): qty 1

## 2015-09-14 MED ORDER — METOLAZONE 5 MG PO TABS
5.0000 mg | ORAL_TABLET | Freq: Two times a day (BID) | ORAL | Status: DC
  Administered 2015-09-14: 5 mg via ORAL
  Filled 2015-09-14: qty 1

## 2015-09-14 NOTE — Telephone Encounter (Signed)
Wife is aware

## 2015-09-14 NOTE — ED Provider Notes (Signed)
CSN: [id number]     Arrival date & time 09/14/15  1649 History   First MD Initiated Contact with Patient 09/14/15 1725     Chief Complaint  Patient presents with  . Altered Mental Status      Patient is a 55 y.o. male presenting with altered mental status. The history is provided by the patient, the EMS personnel and a relative. The history is limited by the condition of the patient (AMS).  Altered Mental Status Pt was seen at 1735. Per EMS and pt's family: Pt with AMS since yesterday. Pt has refused to get out of bed or take his meds. Pt had BM on himself and told family not to clean him up. Pt threatened family when they told him they were going to call EMS. Home Health RN called EMS. Pt himself will not speak to EMS, laying eyes open, intermittently moans and yells at ED staff.   Past Medical History  Diagnosis Date  . Hypertension   . Diabetes mellitus   . GERD (gastroesophageal reflux disease)     DEC 2010 EGD/Bx REACTIVE GASTROPATHY, NO VARICES  . Hemorrhoids, internal   . BMI 40.0-44.9, adult ([place]) OCT 2010 269 LBS    APR 2012 279 LBS AUG 2014 185 LBS  . Cirrhosis ([place]) NOV 2010 CHILD PUGH A    ETOH/HCV/OBESITY  . IV drug abuse REMOTE  . Hepatitis 2010 HEP C    AST 509 ALT 267 ALK PHOS 165 ALB 3.8 NEG IGM HAV/HBSAg.   . Gallstone AUG 2012 1 CM  . GERD 10/25/2009  . COPD (chronic obstructive pulmonary disease) ([place])   . Hepatitis C     failed interferon/ribavirin. treated with ribavirin/sofosbuvir/declastasvir 2016, ?failure or early relapse unable to determine because patient was noncompliant.  . Pancytopenia ([place]) 2013  . Splenomegaly 2013  . Other pancytopenia ([place]) 02/18/2013  . Iron (Fe) deficiency anemia 02/18/2013  . Splenomegaly 02/18/2013  . Neuropathy ([place])   . Biliary dyskinesia JUL 2015    HIDA GB EF 5%  . Chronic pain   . Chronic leg pain     left  . Portal venous hypertension (H[facility])   . Asthma   . Medically noncompliant 05/30/2015  . Renal insufficiency    . Cocaine abuse    Past Surgical History  Procedure Laterality Date  . Sigmoidoscopy      2001 DR. FLEISCHMAN INTERNAL HERMORRHOIDS  . Upper gastrointestinal endoscopy  DEC 2010    BENIGN POLYPS, GASTRITIS, ?phg  . Knee surgery  RIGHT  . Hemorrhoid surgery    . Biopsy  09/08/2011    [name]:Moderate gastritis/Polyps, multiple in the body of the stomach  . Colonoscopy with propofol N/A 11/15/2013    [name]: rectal varices, small AVMs  . Esophagogastroduodenoscopy (egd) with propofol N/A 11/15/2013    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 Next EGD in April 2016.     Family History  Problem Relation Age of Onset  . Colon cancer Neg Hx   . Anesthesia problems Neg Hx   . Hypotension Neg Hx   . Malignant hyperthermia Neg Hx   . Pseudochol deficiency Neg Hx   . Cancer Father    Social History  Substance Use Topics  . Smoking status: Never Smoker   . Smokeless tobacco: Never Used  . Alcohol Use: No     Comment: 30 years ago    Review of Systems  Unable to perform  ROS: Mental status change      Allergies  Penicillins  Home Medications   Prior to Admission medications   Medication Sig Start Date End Date Taking? Authorizing Provider  albuterol (PROVENTIL HFA;VENTOLIN HFA) 108 (90 BASE) MCG/ACT inhaler Inhale 2 puffs into the lungs every 6 (six) hours as needed. Shortness of breath 08/14/14   [name], MD  citalopram (CELEXA) 20 MG tablet Take 1 tablet (20 mg total) by mouth daily. 08/30/15   [name], MD  cyclobenzaprine (FLEXERIL) 5 MG tablet Take 1 tablet (5 mg total) by mouth at bedtime. 08/01/15   [name], MD  furosemide (LASIX) 40 MG tablet Take 80 mg by mouth daily.    Historical Provider, MD  glipiZIDE (GLUCOTROL) 10 MG tablet TAKE 1 TABLET (10 MG TOTAL) BY MOUTH DAILY. 08/30/15   [name], MD  hydrOXYzine (ATARAX/VISTARIL) 10 MG tablet TAKE 1 TABLET (10 MG TOTAL) BY  MOUTH EVERY 4 (FOUR) HOURS AS NEEDED FOR ITCHING. 08/31/15   Chipper Herb, MD  Insulin Glargine (LANTUS SOLOSTAR) 100 UNIT/ML Solostar Pen Inject 10 Units into the skin at bedtime.    Historical Provider, MD  Insulin Pen Needle (BD PEN NEEDLE NANO U/F) 32G X 4 MM MISC Inject 1 application into the skin at bedtime. 08/31/15   Chipper Herb, MD  lactulose (CHRONULAC) 10 GM/15ML solution Take 15 mLs (10 g total) by mouth daily. TAKE 15 MLS (10 G TOTAL) BY MOUTH 3 (THREE) TIMES DAILY. 07/17/15   Bonnielee Haff, MD  LORazepam (ATIVAN) 0.5 MG tablet Take 1 tablet (0.5 mg total) by mouth 2 (two) times daily as needed for anxiety. 08/03/15   Sharion Balloon, FNP  magnesium oxide (MAG-OX) 400 (241.3 MG) MG tablet Take 1 tablet (400 mg total) by mouth 2 (two) times daily. 07/17/15   Bonnielee Haff, MD  metolazone (ZAROXOLYN) 5 MG tablet Take 1 tablet (5 mg total) by mouth 2 (two) times daily. 08/29/15   Orvan Falconer, MD  omeprazole (PRILOSEC) 20 MG capsule Take 2 capsules (40 mg total) by mouth daily. 08/30/15   [name], MD  Oxycodone HCl 10 MG TABS Take 1 tablet (10 mg total) by mouth every 6 (six) hours as needed. 08/31/15   [name], MD  rifaximin (XIFAXAN) 550 MG TABS tablet Take 1 tablet (550 mg total) by mouth 2 (two) times daily. 12/19/14   [name], MD  spironolactone (ALDACTONE) 50 MG tablet Take 1 tablet (50 mg total) by mouth daily. 07/17/15   Bonnielee Haff, MD  torsemide (DEMADEX) 20 MG tablet Take 2 tablets (40 mg total) by mouth daily. Patient not taking: Reported on 09/14/2015 08/29/15   Orvan Falconer, MD  zolpidem (AMBIEN) 10 MG tablet Take 1 tablet (10 mg total) by mouth at bedtime as needed for sleep. 09/05/15 10/05/15  [name], MD   BP 123/58 mmHg  Pulse 94  Temp(Src) 98.5 F (36.9 C) (Rectal)  Resp 16  Ht 6' (1.829 m)  Wt 300 lb (136.079 kg)  BMI 40.68 kg/m2  SpO2 100% Physical Exam 1740: Physical examination:  Nursing notes reviewed; Vital signs and O2 SAT  reviewed;  Constitutional: Well developed, Well nourished, In no acute distress; Head:  Normocephalic, atraumatic; Eyes: EOMI, PERRL, No scleral icterus; ENMT: Mouth and pharynx normal, Mucous membranes dry; Neck: Supple, Full range of motion, No lymphadenopathy; Cardiovascular: Regular rate and rhythm, No gallop; Respiratory: Breath sounds clear & equal bilaterally, No wheezes.  Speaking  full sentences with ease, Normal respiratory effort/excursion; Chest: Nontender, Movement normal; Abdomen: Soft, Nontender, Nondistended, Normal bowel sounds; Genitourinary: No CVA tenderness; Extremities: Pulses normal, No tenderness, No edema, No calf edema or asymmetry.; Neuro: Awake, alert, intermittently moaning and yelling at ED staff. No facial droop. Moves all extremities spontaneously without apparent gross focal motor deficits.; Skin: Color normal, Warm, Dry.   ED Course  Procedures (including critical care time) Labs Review  Imaging Review  I have personally reviewed and evaluated these images and lab results as part of my medical decision-making.   EKG Interpretation   Date/Time:  Friday September 14 2015 17:55:36 EST Ventricular Rate:  94 PR Interval:  154 QRS Duration: 96 QT Interval:  418 QTC Calculation: 523 R Axis:   71 Text Interpretation:  Sinus rhythm Atrial premature complex Prolonged QT  interval When compared with ECG of 08/22/2015 QT has lengthened Confirmed  by [facility]  MD, Nunzio Cory [phone]) on 09/14/2015 6:30:22 PM      MDM  MDM Reviewed: previous chart, nursing note and vitals Reviewed previous: labs and ECG Interpretation: labs, x-ray, CT scan and ECG    Results for orders placed or performed during the hospital encounter of 09/14/15  Comprehensive metabolic panel  Result Value Ref Range   Sodium 139 135 - 145 mmol/L   Potassium 3.8 3.5 - 5.1 mmol/L   Chloride 102 101 - 111 mmol/L   CO2 26 22 - 32 mmol/L   Glucose, Bld 165 (H) 65 - 99 mg/dL   BUN 53 (H) 6 - 20 mg/dL    Creatinine, Ser 2.01 (H) 0.61 - 1.24 mg/dL   Calcium 8.5 (L) 8.9 - 10.3 mg/dL   Total Protein 6.6 6.5 - 8.1 g/dL   Albumin 2.6 (L) 3.5 - 5.0 g/dL   AST 88 (H) 15 - 41 U/L   ALT 37 17 - 63 U/L   Alkaline Phosphatase 86 38 - 126 U/L   Total Bilirubin 3.0 (H) 0.3 - 1.2 mg/dL   GFR calc non Af Amer 36 (L) >60 mL/min   GFR calc Af Amer 42 (L) >60 mL/min   Anion gap 11 5 - 15  CBC  Result Value Ref Range   WBC 5.4 4.0 - 10.5 K/uL   RBC 2.69 (L) 4.22 - 5.81 MIL/uL   Hemoglobin 8.8 (L) 13.0 - 17.0 g/dL   HCT 25.6 (L) 39.0 - 52.0 %   MCV 95.2 78.0 - 100.0 fL   MCH 32.7 26.0 - 34.0 pg   MCHC 34.4 30.0 - 36.0 g/dL   RDW 14.9 11.5 - 15.5 %   Platelets 58 (L) 150 - 400 K/uL  Protime-INR  Result Value Ref Range   Prothrombin Time 14.3 11.6 - 15.2 seconds   INR 1.09 0.00 - 1.49  Ammonia  Result Value Ref Range   Ammonia 65 (H) 9 - 35 umol/L  Acetaminophen level  Result Value Ref Range   Acetaminophen (Tylenol), Serum <10 (L) 10 - 30 ug/mL  Salicylate level  Result Value Ref Range   Salicylate Lvl <5.3 2.8 - 30.0 mg/dL  Ethanol  Result Value Ref Range   Alcohol, Ethyl (B) <5 <5 mg/dL  Troponin I  Result Value Ref Range   Troponin I <0.03 <0.031 ng/mL  Urine rapid drug screen (hosp performed)  Result Value Ref Range   Opiates NONE DETECTED NONE DETECTED   Cocaine POSITIVE (A) NONE DETECTED   Benzodiazepines NONE DETECTED NONE DETECTED   Amphetamines NONE DETECTED NONE DETECTED  Tetrahydrocannabinol NONE DETECTED NONE DETECTED   Barbiturates NONE DETECTED NONE DETECTED  Urinalysis, Routine w reflex microscopic  Result Value Ref Range   Color, Urine YELLOW YELLOW   APPearance HAZY (A) CLEAR   Specific Gravity, Urine 1.010 1.005 - 1.030   pH 5.5 5.0 - 8.0   Glucose, UA NEGATIVE NEGATIVE mg/dL   Hgb urine dipstick LARGE (A) NEGATIVE   Bilirubin Urine NEGATIVE NEGATIVE   Ketones, ur NEGATIVE NEGATIVE mg/dL   Protein, ur 30 (A) NEGATIVE mg/dL   Nitrite NEGATIVE NEGATIVE    Leukocytes, UA SMALL (A) NEGATIVE  Magnesium  Result Value Ref Range   Magnesium 1.7 1.7 - 2.4 mg/dL  Urine microscopic-add on  Result Value Ref Range   Squamous Epithelial / LPF 0-5 (A) NONE SEEN   WBC, UA TOO NUMEROUS TO COUNT 0 - 5 WBC/hpf   RBC / HPF TOO NUMEROUS TO COUNT 0 - 5 RBC/hpf   Bacteria, UA MANY (A) NONE SEEN  CBG monitoring, ED  Result Value Ref Range   Glucose-Capillary 138 (H) 65 - 99 mg/dL   Dg Chest Port 1 View 09/14/2015  CLINICAL DATA:  AMS, EMS reports wife called EMS for patient altered since this morning. Patient has hx of liver failure per EMS, HISTORY OF ASTHMA, DM, HTN, COPD, COCAINE ABUSE, BEST IMAGES OBTAINED DUE TO PATIENTS CONDITION EXAM: PORTABLE CHEST 1 VIEW COMPARISON:  08/22/2015 FINDINGS: Enlarged cardiac. Lungs are clear. No effusion, infiltrate or pneumothorax. No acute osseous abnormality. IMPRESSION: Cardiomegaly without acute findings. Electronically Signed   By: Suzy Bouchard M.D.   On: 09/14/2015 18:43   Ct Head Wo Contrast 09/14/2015  CLINICAL DATA:  Altered mental status. EXAM: CT HEAD WITHOUT CONTRAST TECHNIQUE: Contiguous axial images were obtained from the base of the skull through the vertex without intravenous contrast. COMPARISON:  CT scan of July 31, 2014. FINDINGS: Bony calvarium appears intact. No mass effect or midline shift is noted. Ventricular size is within normal limits. There is no evidence of mass lesion, hemorrhage or acute infarction. IMPRESSION: Normal head CT. Electronically Signed   By: Marijo Conception, M.D.   On: 09/14/2015 20:19    2030:  BUN/Cr per baseline. Total bili per baseline. TNTC WBC cells on Udip, UC is pending; IV rocephin given. Dx and testing d/w pt's family.  Questions answered.  Verb understanding, agreeable to admit. T/C to [facility] Comment, case discussed, including:  HPI, pertinent PM/SHx, VS/PE, dx testing, ED course and treatment:  Agreeable to come to ED for evaluation to admit.    Francine Graven,  DO 09/16/15 2126

## 2015-09-14 NOTE — Telephone Encounter (Signed)
Stp's wife [name] and she states [name] the home health nurse is there and she is calling EMS.
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
## 2015-09-14 NOTE — ED Notes (Signed)
Report given to floor, advised that they will need to change beds,

## 2015-09-14 NOTE — Progress Notes (Signed)
ANTIBIOTIC CONSULT NOTE - INITIAL  Pharmacy Consult for rocephin Indication: UTI  Allergies  Allergen Reactions  . Penicillins Hives    Has patient had a PCN reaction causing immediate rash, facial/tongue/throat swelling, SOB or lightheadedness with hypotension: Yes Has patient had a PCN reaction causing severe rash involving mucus membranes or skin necrosis: Yes Has patient had a PCN reaction that required hospitalization No Has patient had a PCN reaction occurring within the last 10 years: No If all of the above answers are "NO", then may proceed with Cephalosporin use.     Patient Measurements: Height: 6' (182.9 cm) Weight: 300 lb (136.079 kg) IBW/kg (Calculated) : 77.6  Temp: 98.5 F (36.9 C) (02/03 1655) Temp Source: Rectal (02/03 1655) BP: 123/58 mmHg (02/03 1929) Pulse Rate: 94 (02/03 1929) Intake/Output from previous day:   Intake/Output from this shift:    Labs:  Recent Labs  09/14/15 1731  WBC 5.4  HGB 8.8*  PLT 58*  CREATININE 2.01*   Estimated Creatinine Clearance: 60.7 mL/min (by C-G formula based on Cr of 2.01). No results for input(s): VANCOTROUGH, VANCOPEAK, VANCORANDOM, GENTTROUGH, GENTPEAK, GENTRANDOM, TOBRATROUGH, TOBRAPEAK, TOBRARND, AMIKACINPEAK, AMIKACINTROU, AMIKACIN in the last 72 hours.   Microbiology: Recent Results (from the past 720 hour(s))  Culture, body fluid-bottle     Status: None   Collection Time: 08/22/15  4:45 PM  Result Value Ref Range Status   Specimen Description ASCITIC  Final   Special Requests BOTTLES DRAWN AEROBIC AND ANAEROBIC 8CC EACH  Final   Culture NO GROWTH 5 DAYS  Final   Report Status 08/27/2015 FINAL  Final  Gram stain     Status: None   Collection Time: 08/22/15  4:45 PM  Result Value Ref Range Status   Specimen Description ASCITIC  Final   Special Requests NONE  Final   Gram Stain NO ORGANISMS SEEN RARE WBC SEEN   Final   Report Status 08/22/2015 FINAL  Final  Urine culture     Status: Abnormal   Collection Time: 08/31/15  4:40 PM  Result Value Ref Range Status   Urine Culture, Routine Final report (A)  Final   Urine Culture result 1 Escherichia coli (A)  Final    Comment: Greater than 100,000 colony forming units per mL   ANTIMICROBIAL SUSCEPTIBILITY Comment  Final    Comment:       ** S = Susceptible; I = Intermediate; R = Resistant **                    P = Positive; N = Negative             MICS are expressed in micrograms per mL    Antibiotic                 RSLT#1    RSLT#2    RSLT#3    RSLT#4 Amoxicillin/Clavulanic Acid    R Ampicillin                     I Cefepime                       S Ceftriaxone                    S Cefuroxime                     R Cephalothin  R Ciprofloxacin                  R Ertapenem                      S Gentamicin                     S Imipenem                       S Levofloxacin                   R Nitrofurantoin                 S Piperacillin                   S Tetracycline                   S Tobramycin                     S Trimethoprim/Sulfa             S     Medical History: Past Medical History  Diagnosis Date  . Hypertension   . Diabetes mellitus   . GERD (gastroesophageal reflux disease)     DEC 2010 EGD/Bx REACTIVE GASTROPATHY, NO VARICES  . Hemorrhoids, internal   . BMI 40.0-44.9, adult ([place]) OCT 2010 269 LBS    APR 2012 279 LBS AUG 2014 185 LBS  . Cirrhosis ([place]) NOV 2010 CHILD PUGH A    ETOH/HCV/OBESITY  . IV drug abuse REMOTE  . Hepatitis 2010 HEP C    AST 509 ALT 267 ALK PHOS 165 ALB 3.8 NEG IGM HAV/HBSAg.   . Gallstone AUG 2012 1 CM  . GERD 10/25/2009  . COPD (chronic obstructive pulmonary disease) ([place])   . Hepatitis C     failed interferon/ribavirin. treated with ribavirin/sofosbuvir/declastasvir 2016, ?failure or early relapse unable to determine because patient was noncompliant.  . Pancytopenia ([place]) 2013  . Splenomegaly 2013  . Other pancytopenia ([place]) 02/18/2013  . Iron (Fe)  deficiency anemia 02/18/2013  . Splenomegaly 02/18/2013  . Neuropathy ([place])   . Biliary dyskinesia JUL 2015    HIDA GB EF 5%  . Chronic pain   . Chronic leg pain     left  . Portal venous hypertension ([facility])   . Asthma   . Medically noncompliant 05/30/2015  . Renal insufficiency   . Cocaine abuse     Medications:  See medication history Assessment: 55 yo man to start rocephin for UTI.  He has a PCN allergy but has received rocephin in the past  Goal of Therapy:  Eradication of infection  Plan:  Rocephin 1 gm IV q24 hours F/u cultures and clinical course  Dallana Mavity Poteet 09/14/2015,7:57 PM

## 2015-09-14 NOTE — ED Notes (Signed)
Patient cleaned from soil. Redness noted to bilateral buttocks crease.

## 2015-09-14 NOTE — ED Notes (Signed)
Dr Le at bedside,  

## 2015-09-14 NOTE — Patient Outreach (Signed)
09/14/15- Telephone call to patient for transition of care week 3, permission given to speak with wife Laquarius Stemarie, HIPAA verified, wife states pt doing well today, weight 300 pounds, CBG 129 and states " all readings have been good", home health continues working with pt and nurse is to see pt today, wife states [facility] pharmacist has home visit scheduled today, pt continues with fluid restrictions and low salt diet per wife.  Pt saw Dr. Sabra Heck last week and wife states " he's been put on mild nerve pill" and reports pt has all medications and taking as prescribed.  No new issues or concerns reported today.  THN CM Care Plan Problem One        Most Recent Value   Care Plan Problem One  Pt high risk for hospitalization related to disease process   Role Documenting the Problem One  Care Management Coordinator   Care Plan for Problem One  Active   THN Long Term Goal (31-90 days)  pt will have no hospitalizations within 90 days.   THN Long Term Goal Start Date  08/31/15 Barrie Folk restarted- pt hospitalized]   Interventions for Problem One Long Term Goal  RN CM reinforced and reviewed action plan with wife and importance of taking medications as prescribed.  RN CM reviewed importance of reporting change in health status such as edema in belly, lower extremities, confusion to MD immediately, reviewed other resources (RN CM, home health and nurse on call,  24 hour nurse line, 911)   THN CM Short Term Goal #1 Start Date  -- [goal restarted- pt hospitalized]   THN CM Short Term Goal #2 Start Date  -- [goal restarted]      PLAN See pt for scheduled home visit on 2/ 7/17  [name] [facility] [place] Coordinator [phone]

## 2015-09-14 NOTE — H&P (Signed)
[facility] History and Physical  [name] MRN:[id number] DOB: 11/24/1960    PCP:   MILLER, STEPHEN M, MD   Chief Complaint: Altered Mental Status  HPI: [name] is an 55 y.o. male with a hx of HTN, DM, GERD, alcoholic cirrhosis, hepatitis C, COPD, and chronic pain that presents with AMS that began yesterday. Per family members at bedside, patient has been increasingly confused, lethargic, and not taking his medications x2 days. Recently, he began taking his Lactulose once a day rather than 3x a day as prescribed.  Further history is limited secondary to patient's clinical condition. While in the ED, head CT and CXR were negative.  Labs revealed a Creatinine of 2.01, BUN 53, ammonia 65, glucose 165, Albumin 2.6, platelets 58, and a normal WBC. UA shows possible infection. He will be admitted for further management of hepatic encephalopathy and possible UTI.   Rewiew of Systems:  Unobtainable secondary to patient's confusion.    Past Medical History  Diagnosis Date  . Hypertension   . Diabetes mellitus   . GERD (gastroesophageal reflux disease)     DEC 2010 EGD/Bx REACTIVE GASTROPATHY, NO VARICES  . Hemorrhoids, internal   . BMI 40.0-44.9, adult (HCC) OCT 2010 269 LBS    APR 2012 279 LBS AUG 2014 185 LBS  . Cirrhosis (HCC) NOV 2010 CHILD PUGH A    ETOH/HCV/OBESITY  . IV drug abuse REMOTE  . Hepatitis 2010 HEP C    AST 509 ALT 267 ALK PHOS 165 ALB 3.8 NEG IGM HAV/HBSAg.   . Gallstone AUG 2012 1 CM  . GERD 10/25/2009  . COPD (chronic obstructive pulmonary disease) (HCC)   . Hepatitis C     failed interferon/ribavirin. treated with ribavirin/sofosbuvir/declastasvir 2016, ?failure or early relapse unable to determine because patient was noncompliant.  . Pancytopenia (HCC) 2013  . Splenomegaly 2013  . Other pancytopenia (HCC) 02/18/2013  . Iron (Fe) deficiency anemia 02/18/2013  . Splenomegaly 02/18/2013  . Neuropathy (HCC)   . Biliary dyskinesia JUL 2015    HIDA GB  EF 5%  . Chronic pain   . Chronic leg pain     left  . Portal venous hypertension (HCC)   . Asthma   . Medically noncompliant 05/30/2015  . Renal insufficiency   . Cocaine abuse     Past Surgical History  Procedure Laterality Date  . Sigmoidoscopy      2001 DR. FLEISCHMAN INTERNAL HERMORRHOIDS  . Upper gastrointestinal endoscopy  DEC 2010    BENIGN POLYPS, GASTRITIS, ?phg  . Knee surgery  RIGHT  . Hemorrhoid surgery    . Biopsy  09/08/2011    Dr. Fields:Moderate gastritis/Polyps, multiple in the body of the stomach  . Colonoscopy with propofol N/A 11/15/2013    Dr. Fields: rectal varices, small AVMs  . Esophagogastroduodenoscopy (egd) with propofol N/A 11/15/2013    Dr. 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 Next EGD in April 2016.      Medications:  HOME MEDS: Prior to Admission medications   Medication Sig Start Date End Date Taking? Authorizing Provider  albuterol (PROVENTIL HFA;VENTOLIN HFA) 108 (90 BASE) MCG/ACT inhaler Inhale 2 puffs into the lungs every 6 (six) hours as needed. Shortness of breath 08/14/14   Stephen M Miller, MD  citalopram (CELEXA) 20 MG tablet Take 1 tablet (20 mg total) by mouth daily. 08/30/15   Stephen M Miller, MD  cyclobenzaprine (FLEXERIL) 5 MG tablet Take 1 tablet (  5 mg total) by mouth at bedtime. 08/01/15   Stephen M Miller, MD  furosemide (LASIX) 40 MG tablet Take 80 mg by mouth daily.    Historical Provider, MD  glipiZIDE (GLUCOTROL) 10 MG tablet TAKE 1 TABLET (10 MG TOTAL) BY MOUTH DAILY. 08/30/15   Stephen M Miller, MD  hydrOXYzine (ATARAX/VISTARIL) 10 MG tablet TAKE 1 TABLET (10 MG TOTAL) BY MOUTH EVERY 4 (FOUR) HOURS AS NEEDED FOR ITCHING. 08/31/15   Donald W Moore, MD  Insulin Glargine (LANTUS SOLOSTAR) 100 UNIT/ML Solostar Pen Inject 10 Units into the skin at bedtime.    Historical Provider, MD  Insulin Pen Needle (BD PEN NEEDLE NANO U/F) 32G X 4 MM MISC Inject 1 application into the  skin at bedtime. 08/31/15   Donald W Moore, MD  lactulose (CHRONULAC) 10 GM/15ML solution Take 15 mLs (10 g total) by mouth daily. TAKE 15 MLS (10 G TOTAL) BY MOUTH 3 (THREE) TIMES DAILY. 07/17/15   Gokul Krishnan, MD  LORazepam (ATIVAN) 0.5 MG tablet Take 1 tablet (0.5 mg total) by mouth 2 (two) times daily as needed for anxiety. 08/03/15   Christy A Hawks, FNP  magnesium oxide (MAG-OX) 400 (241.3 MG) MG tablet Take 1 tablet (400 mg total) by mouth 2 (two) times daily. 07/17/15   Gokul Krishnan, MD  metolazone (ZAROXOLYN) 5 MG tablet Take 1 tablet (5 mg total) by mouth 2 (two) times daily. 08/29/15   Peter Le, MD  omeprazole (PRILOSEC) 20 MG capsule Take 2 capsules (40 mg total) by mouth daily. 08/30/15   Stephen M Miller, MD  Oxycodone HCl 10 MG TABS Take 1 tablet (10 mg total) by mouth every 6 (six) hours as needed. 08/31/15   Stephen M Miller, MD  rifaximin (XIFAXAN) 550 MG TABS tablet Take 1 tablet (550 mg total) by mouth 2 (two) times daily. 12/19/14   Stephen M Miller, MD  spironolactone (ALDACTONE) 50 MG tablet Take 1 tablet (50 mg total) by mouth daily. 07/17/15   Gokul Krishnan, MD  torsemide (DEMADEX) 20 MG tablet Take 2 tablets (40 mg total) by mouth daily. Patient not taking: Reported on 09/14/2015 08/29/15   Peter Le, MD  zolpidem (AMBIEN) 10 MG tablet Take 1 tablet (10 mg total) by mouth at bedtime as needed for sleep. 09/05/15 10/05/15  Stephen M Miller, MD     Allergies:  Allergies  Allergen Reactions  . Penicillins Hives    Has patient had a PCN reaction causing immediate rash, facial/tongue/throat swelling, SOB or lightheadedness with hypotension: Yes Has patient had a PCN reaction causing severe rash involving mucus membranes or skin necrosis: Yes Has patient had a PCN reaction that required hospitalization No Has patient had a PCN reaction occurring within the last 10 years: No If all of the above answers are "NO", then may proceed with Cephalosporin use.     Social History:    reports that he has never smoked. He has never used smokeless tobacco. He reports that he does not drink alcohol or use illicit drugs.  Family History: Family History  Problem Relation Age of Onset  . Colon cancer Neg Hx   . Anesthesia problems Neg Hx   . Hypotension Neg Hx   . Malignant hyperthermia Neg Hx   . Pseudochol deficiency Neg Hx   . Cancer Father      Physical Exam: Filed Vitals:   09/14/15 1655 09/14/15 1700 09/14/15 1800 09/14/15 1929  BP: 162/75 155/122 140/101 123/58  Pulse: 96 96 95 94    Temp: 98.5 F (36.9 C)     TempSrc: Rectal     Resp:  _0 Height: 6' (1.829 m)     Weight: 136.079 kg (300 lb)     SpO2: 18% 100% 100% 100%   Blood pressure 123/58, pulse 94, temperature 98.5 F (36.9 C), temperature source Rectal, resp. rate 16, height 6' (1.829 m), weight 136.079 kg (300 lb), SpO2 100 %.  GEN:  Pleasant. Patient lying in the stretcher in no acute distress; cooperative with exam. PSYCH:  Alert but lethargic HEENT: Mild scleral icterus. Mucous membranes pink. PERRLA; EOM intact; no cervical lymphadenopathy nor thyromegaly or carotid bruit; no JVD; There were no stridor. Neck is very supple. Breasts:: Not examined CHEST WALL: No tenderness CHEST: Normal respiration, clear to auscultation bilaterally.  HEART: Regular rate and rhythm.  There are no murmur, rub, or gallops.   BACK: No kyphosis or scoliosis; no CVA tenderness ABDOMEN:  Ascites. Non-tender; no masses, no organomegaly, normal abdominal bowel sounds; no pannus; no intertriginous candida. There is no rebound.   Rectal Exam: Not done EXTREMITIES: No bone or joint deformity; age-appropriate arthropathy of the hands and knees; 2+ Yamir Carignan edema; no ulcerations.  There is no calf tenderness. No asterixis Genitalia: not examined PULSES: 2+ and symmetric SKIN: Normal hydration no rash or ulceration CNS: Cranial nerves 2-12 grossly intact no focal lateralizing neurologic deficit.  Speech is fluent; uvula  elevated with phonation, facial symmetry and tongue midline. DTR are normal bilaterally, cerebella exam is intact, barbinski is negative and strengths are equaled bilaterally.  No sensory loss.   Labs on Admission:  Basic Metabolic Panel:  Recent Labs Lab 09/14/15 1731  NA 139  K 3.8  CL 102  CO2 26  GLUCOSE 165*  BUN 53*  CREATININE 2.01*  CALCIUM 8.5*  MG 1.7   Liver Function Tests:  Recent Labs Lab 09/14/15 1731  AST 88*  ALT 37  ALKPHOS 86  BILITOT 3.0*  PROT 6.6  ALBUMIN 2.6*       Recent Labs Lab 09/14/15 1731  AMMONIA 65*   CBC:  Recent Labs Lab 09/14/15 1731  WBC 5.4  HGB 8.8*  HCT 25.6*  MCV 95.2  PLT 58*   Cardiac Enzymes:  Recent Labs Lab 09/14/15 1731  TROPONINI <0.03    CBG:  Recent Labs Lab 09/14/15 1729  GLUCAP 138*     Radiological Exams on Admission: Ct Head Wo Contrast  09/14/2015  CLINICAL DATA:  Altered mental status. EXAM: CT HEAD WITHOUT CONTRAST TECHNIQUE: Contiguous axial images were obtained from the base of the skull through the vertex without intravenous contrast. COMPARISON:  CT scan of July 31, 2014. FINDINGS: Bony calvarium appears intact. No mass effect or midline shift is noted. Ventricular size is within normal limits. There is no evidence of mass lesion, hemorrhage or acute infarction. IMPRESSION: Normal head CT. Electronically Signed   By: Marijo Conception, M.D.   On: 09/14/2015 20:19   Dg Chest Port 1 View  09/14/2015  CLINICAL DATA:  AMS, EMS reports wife called EMS for patient altered since this morning. Patient has hx of liver failure per EMS, HISTORY OF ASTHMA, DM, HTN, COPD, COCAINE ABUSE, BEST IMAGES OBTAINED DUE TO PATIENTS CONDITION EXAM: PORTABLE CHEST 1 VIEW COMPARISON:  08/22/2015 FINDINGS: Enlarged cardiac. Lungs are clear. No effusion, infiltrate or pneumothorax. No acute osseous abnormality. IMPRESSION: Cardiomegaly without acute findings. Electronically Signed   By: Suzy Bouchard M.D.   On:  09/14/2015 18:43  EKG: Independently reviewed.     Assessment/Plan  . Anasarca . Encephalopathy, hepatic ([place])    UTI   DM  PLAN: Patient will be admitted for acute encephalopathy. Will decrease his pain medications and hold his sedative meds including benzo, as that may be contributing to his confusion. For hepatic encephalopathy, we will increase Lactulose to BID and continue Xifaxan. For his UTI, will give IV Rocephin and order urine culture.  CKD stage III, creatinine appears mildly elevated from baseline. Will give gentle IVF and monitor. DM type 2, will start on SSI. Chronic thrombocytopenia, related to liver disease appears stable.  Other plans as per orders.  Code Status:  Full    Orvan Falconer, MD. FACP [facility] Pager [phone] 7pm to 7am.  09/14/2015, 8:40 PM   By signing my name below, I, Rosalie Doctor attest that this documentation has been prepared under the direction and in the presence of Orvan Falconer, MD Electronically signed: Rosalie Doctor, Scribe. 09/14/2015

## 2015-09-14 NOTE — ED Notes (Signed)
MD at bedside. 

## 2015-09-14 NOTE — ED Notes (Signed)
EMS reports wife called EMS for patient altered since this morning. Patient has hx of liver failure per EMS. Patient moaning but denies pain.

## 2015-09-14 NOTE — Telephone Encounter (Signed)
Pt did not show for his PARA today because he said that he had the stomach bug.

## 2015-09-14 NOTE — Telephone Encounter (Signed)
REVIEWED. PLEASE CALL PT. The order is in so if he needs his paracentesis he should call u/s to schedule.

## 2015-09-15 LAB — GLUCOSE, CAPILLARY
GLUCOSE-CAPILLARY: 201 mg/dL — AB (ref 65–99)
GLUCOSE-CAPILLARY: 154 mg/dL — AB (ref 65–99)
Glucose-Capillary: 209 mg/dL — ABNORMAL HIGH (ref 65–99)

## 2015-09-15 MED ORDER — HYDROXYZINE HCL 10 MG PO TABS
10.0000 mg | ORAL_TABLET | Freq: Once | ORAL | Status: AC
  Administered 2015-09-15: 10 mg via ORAL
  Filled 2015-09-15: qty 1

## 2015-09-15 MED ORDER — OXYCODONE HCL 5 MG PO TABS
5.0000 mg | ORAL_TABLET | Freq: Four times a day (QID) | ORAL | Status: DC | PRN
  Administered 2015-09-15 – 2015-09-17 (×6): 5 mg via ORAL
  Filled 2015-09-15 (×6): qty 1

## 2015-09-15 MED ORDER — INSULIN ASPART 100 UNIT/ML ~~LOC~~ SOLN
0.0000 [IU] | Freq: Three times a day (TID) | SUBCUTANEOUS | Status: DC
  Administered 2015-09-15: 2 [IU] via SUBCUTANEOUS
  Administered 2015-09-15 – 2015-09-16 (×2): 3 [IU] via SUBCUTANEOUS
  Administered 2015-09-16 – 2015-09-17 (×4): 2 [IU] via SUBCUTANEOUS

## 2015-09-15 MED ORDER — INSULIN ASPART 100 UNIT/ML ~~LOC~~ SOLN
4.0000 [IU] | Freq: Once | SUBCUTANEOUS | Status: AC
  Administered 2015-09-15: 4 [IU] via SUBCUTANEOUS

## 2015-09-15 NOTE — Progress Notes (Signed)
[facility] PROGRESS NOTE  [name] [id number] DOB: [birth year] DOA: 09/14/2015 PCP: [name], MD  Assessment/Plan: 1. Encephalopathy -hepatic, meds contributing and possible UTI -improving -continue lactulose, resume diet -PT/OT  2. Possible UTI -continue ceftriaxone, FU Urine Cx  3. Cirrhosis/Ascites -due to ETOH and Hep C -continue demadex and aldactone, will get US guided paracentesis  4. COPD -stable  5. DM -SSI for now, hold low dose lantus, carb mod diet  Code Status: d/w pt abt DNR, he is agreeable now Family Communication: brother, sister and aunt at bedside Disposition Plan: Home when improved, ?2days   Antibiotics: ceftraixone HPI/Subjective: Feels better, has abd pain and distension  Objective: Filed Vitals:   09/14/15 2312 09/15/15 0601  BP: 151/65 132/69  Pulse: 96 98  Temp: 97.5 F (36.4 C) 98.6 F (37 C)  Resp:  16    Intake/Output Summary (Last 24 hours) at 09/15/15 0847 Last data filed at 09/15/15 0800  Gross per 24 hour  Intake     50 ml  Output    100 ml  Net    -50 ml   Filed Weights   09/14/15 1655 09/14/15 2205  Weight: 136.079 kg (300 lb) 141 kg (310 lb 13.6 oz)    Exam:   General:  AAOx3, no distress  Cardiovascular:S1S2/RRR  Respiratory: diminished BS at bases  Abdomen: soft, obese, pos fluid thrill, BS present, non tender  Musculoskeletal: trace edema    Data Reviewed: Basic Metabolic Panel:  Recent Labs Lab 09/14/15 1731  NA 139  K 3.8  CL 102  CO2 26  GLUCOSE 165*  BUN 53*  CREATININE 2.01*  CALCIUM 8.5*  MG 1.7   Liver Function Tests:  Recent Labs Lab 09/14/15 1731  AST 88*  ALT 37  ALKPHOS 86  BILITOT 3.0*  PROT 6.6  ALBUMIN 2.6*   No results for input(s): LIPASE, AMYLASE in the last 168 hours.  Recent Labs Lab 09/14/15 1731  AMMONIA 65*   CBC:  Recent Labs Lab 09/14/15 1731  WBC 5.4  HGB 8.8*  HCT 25.6*  MCV 95.2  PLT 58*   Cardiac  Enzymes:  Recent Labs Lab 09/14/15 1731  TROPONINI <0.03   BNP (last 3 results)  Recent Labs  06/11/15 1546  BNP 89.0    ProBNP (last 3 results) No results for input(s): PROBNP in the last 8760 hours.  CBG:  Recent Labs Lab 09/14/15 1729  GLUCAP 138*    No results found for this or any previous visit (from the past 240 hour(s)).   Studies: Ct Head Wo Contrast  09/14/2015  CLINICAL DATA:  Altered mental status. EXAM: CT HEAD WITHOUT CONTRAST TECHNIQUE: Contiguous axial images were obtained from the base of the skull through the vertex without intravenous contrast. COMPARISON:  CT scan of July 31, 2014. FINDINGS: Bony calvarium appears intact. No mass effect or midline shift is noted. Ventricular size is within normal limits. There is no evidence of mass lesion, hemorrhage or acute infarction. IMPRESSION: Normal head CT. Electronically Signed   By: Marijo Conception, M.D.   On: 09/14/2015 20:19   Dg Chest Port 1 View  09/14/2015  CLINICAL DATA:  AMS, EMS reports wife called EMS for patient altered since this morning. Patient has hx of liver failure per EMS, HISTORY OF ASTHMA, DM, HTN, COPD, COCAINE ABUSE, BEST IMAGES OBTAINED DUE TO PATIENTS CONDITION EXAM: PORTABLE CHEST 1 VIEW COMPARISON:  08/22/2015 FINDINGS: Enlarged cardiac. Lungs are clear. No effusion, infiltrate or pneumothorax.  No acute osseous abnormality. IMPRESSION: Cardiomegaly without acute findings. Electronically Signed   By: Suzy Bouchard M.D.   On: 09/14/2015 18:43    Scheduled Meds: . cefTRIAXone (ROCEPHIN)  IV  1 g Intravenous Q24H  . citalopram  20 mg Oral Daily  . lactulose  10 g Oral BID  . pantoprazole  40 mg Oral Daily  . rifaximin  550 mg Oral BID  . spironolactone  50 mg Oral Daily  . torsemide  40 mg Oral Daily   Continuous Infusions:  Antibiotics Given (last 72 hours)    Date/Time Action Medication Dose   09/14/15 2357 Given   rifaximin (XIFAXAN) tablet 550 mg 550 mg      Active  Problems:   Encephalopathy, hepatic (HCC)   Diabetes ([place])   Anasarca   UTI (lower urinary tract infection)   Altered mental status    Time spent: 69min    Nekhi Liwanag  [facility] Pager [phone]. If 7PM-7AM, please contact night-coverage at [email or web address], password [email or web address] 09/15/2015, 8:47 AM  LOS: 1 day

## 2015-09-16 DIAGNOSIS — K7031 Alcoholic cirrhosis of liver with ascites: Secondary | ICD-10-CM

## 2015-09-16 DIAGNOSIS — R188 Other ascites: Secondary | ICD-10-CM

## 2015-09-16 LAB — GLUCOSE, CAPILLARY
GLUCOSE-CAPILLARY: 158 mg/dL — AB (ref 65–99)
GLUCOSE-CAPILLARY: 193 mg/dL — AB (ref 65–99)
GLUCOSE-CAPILLARY: 222 mg/dL — AB (ref 65–99)
Glucose-Capillary: 156 mg/dL — ABNORMAL HIGH (ref 65–99)

## 2015-09-16 LAB — COMPREHENSIVE METABOLIC PANEL
ALBUMIN: 2.4 g/dL — AB (ref 3.5–5.0)
ALK PHOS: 72 U/L (ref 38–126)
ALT: 37 U/L (ref 17–63)
AST: 92 U/L — AB (ref 15–41)
Anion gap: 10 (ref 5–15)
BILIRUBIN TOTAL: 2.9 mg/dL — AB (ref 0.3–1.2)
BUN: 49 mg/dL — AB (ref 6–20)
CALCIUM: 8.3 mg/dL — AB (ref 8.9–10.3)
CO2: 25 mmol/L (ref 22–32)
CREATININE: 1.89 mg/dL — AB (ref 0.61–1.24)
Chloride: 100 mmol/L — ABNORMAL LOW (ref 101–111)
GFR calc Af Amer: 45 mL/min — ABNORMAL LOW (ref >60)
GFR, EST NON AFRICAN AMERICAN: 39 mL/min — AB (ref >60)
GLUCOSE: 166 mg/dL — AB (ref 65–99)
Potassium: 3.7 mmol/L (ref 3.5–5.1)
Sodium: 135 mmol/L (ref 135–145)
TOTAL PROTEIN: 6.2 g/dL — AB (ref 6.5–8.1)

## 2015-09-16 LAB — CBC
HEMATOCRIT: 26.8 % — AB (ref 39.0–52.0)
HEMOGLOBIN: 9.1 g/dL — AB (ref 13.0–17.0)
MCH: 32.9 pg (ref 26.0–34.0)
MCHC: 34 g/dL (ref 30.0–36.0)
MCV: 96.8 fL (ref 78.0–100.0)
Platelets: 63 10*3/uL — ABNORMAL LOW (ref 150–400)
RBC: 2.77 MIL/uL — ABNORMAL LOW (ref 4.22–5.81)
RDW: 15 % (ref 11.5–15.5)
WBC: 5.8 10*3/uL (ref 4.0–10.5)

## 2015-09-16 MED ORDER — ONDANSETRON HCL 4 MG/2ML IJ SOLN: 4.0000 mg | Freq: Four times a day (QID) | INTRAMUSCULAR | Status: DC | PRN

## 2015-09-16 MED ORDER — HYDROXYZINE HCL 10 MG PO TABS
10.0000 mg | ORAL_TABLET | Freq: Three times a day (TID) | ORAL | Status: DC | PRN
  Administered 2015-09-16: 10 mg via ORAL
  Filled 2015-09-16: qty 1

## 2015-09-16 MED ORDER — TRAZODONE HCL 50 MG PO TABS
50.0000 mg | ORAL_TABLET | Freq: Every evening | ORAL | Status: DC | PRN
  Administered 2015-09-16: 50 mg via ORAL
  Filled 2015-09-16: qty 1

## 2015-09-16 NOTE — Progress Notes (Addendum)
[facility] PROGRESS NOTE  [name] [id number] DOB: 12/27/1960 DOA: 09/14/2015 PCP: Cory Honour, MD  brief narrative 55 year old male with history of hep C and alcoholic decompensated liver cirrhosis, COPD, chronic pain, kidney disease stage III , hypertension, diabetes mellitus and GERD presented with one-day history of acute encephalopathy with lethargy. Patient had not taken his medications for the past 2 days. He was recently switched to lactulose once a day probably 3 times a day. Ammonia in the ED was 65. Patient also found to have ascites is able UTI. Admitted for hepatic encephalopathy.   Assessment/Plan: Decompensated liver cirrhosis with acute hepatic encephalopathy Continue lactulose titrating to at least 3 bowel movements daily. Mental status much improved. Added Atarax PRN for itching. Monitor LFTs closely. monitor low platelets. Continue torsemide and Aldactone. Continues rifaximin. PT/OT evaluation.  UTI Culture growing GNR. Sensitivity pending. Continue empiric Rocephin.   Ascites Ordered for paracentesis. No clinical signs of SBP.  COPD Stable. Continue home inhalers.  Diabetes mellitus Monitor on sliding scale insulin only. Lantus held.  Chronic kidney disease stage III Renal function at baseline. Continue to monitor.  Chronic  pain On oxycodone when necessary.   Insomnia Added when necessary trazodone at bedtime   Diet: Heart healthy/Diabetic  DVT prophylaxis: SCDs  Code Status : DNR Family Communication: none At bedside Disposition Plan: home in 1-2 days. Paracentesis tomorrow   Consultants:  none  Procedures:  US paracentesis ordered  Antibiotics:  IV rocephin  HPI/Subjective: Seen and examined. Complains of abdominal distention and unable to sleep. No overnight issues. Mentation improved.  Objective: Filed Vitals:   09/15/15 2146 09/16/15 0457  BP: 134/65 184/85  Pulse: 88 99  Temp: 98.6 F (37 C) 98 F  (36.7 C)  Resp: 16 16    Intake/Output Summary (Last 24 hours) at 09/16/15 1259 Last data filed at 09/16/15 1100  Gross per 24 hour  Intake    690 ml  Output   1279 ml  Net   -589 ml   Filed Weights   09/14/15 1655 09/14/15 2205  Weight: 136.079 kg (300 lb) 141 kg (310 lb 13.6 oz)    Exam:   General:  Middle aged obese male not in distress  HEENT: Icterus +, no pallor, moist mucosa  Chest: Clear bilaterally  CVS: Normal S1 and S2, no murmurs  GI: Distended with ascites, nontender,   Musculoskeletal: Trace edema bilaterally,  CNS: Alert and oriented, no tremors  Data Reviewed: Basic Metabolic Panel:  Recent Labs Lab 09/14/15 1731 09/16/15 0640  NA 139 135  K 3.8 3.7  CL 102 100*  CO2 26 25  GLUCOSE 165* 166*  BUN 53* 49*  CREATININE 2.01* 1.89*  CALCIUM 8.5* 8.3*  MG 1.7  --    Liver Function Tests:  Recent Labs Lab 09/14/15 1731 09/16/15 0640  AST 88* 92*  ALT 37 37  ALKPHOS 86 72  BILITOT 3.0* 2.9*  PROT 6.6 6.2*  ALBUMIN 2.6* 2.4*   No results for input(s): LIPASE, AMYLASE in the last 168 hours.  Recent Labs Lab 09/14/15 1731  AMMONIA 65*   CBC:  Recent Labs Lab 09/14/15 1731 09/16/15 0640  WBC 5.4 5.8  HGB 8.8* 9.1*  HCT 25.6* 26.8*  MCV 95.2 96.8  PLT 58* 63*   Cardiac Enzymes:  Recent Labs Lab 09/14/15 1731  TROPONINI <0.03   BNP (last 3 results)  Recent Labs  06/11/15 1546  BNP 89.0    ProBNP (last 3 results) No results  for input(s): PROBNP in the last 8760 hours.  CBG:  Recent Labs Lab 09/15/15 1211 09/15/15 1717 09/15/15 2051 09/16/15 0818 09/16/15 1148  GLUCAP 201* 154* 209* 158* 193*    Recent Results (from the past 240 hour(s))  Urine culture     Status: None (Preliminary result)   Collection Time: 09/14/15  6:02 PM  Result Value Ref Range Status   Specimen Description URINE, CATHETERIZED  Final   Special Requests NONE  Final   Culture   Final    >=100,000 COLONIES/mL GRAM NEGATIVE  RODS CULTURE REINCUBATED FOR BETTER GROWTH Performed at [facility]    Report Status PENDING  Incomplete     Studies: Ct Head Wo Contrast  09/14/2015  CLINICAL DATA:  Altered mental status. EXAM: CT HEAD WITHOUT CONTRAST TECHNIQUE: Contiguous axial images were obtained from the base of the skull through the vertex without intravenous contrast. COMPARISON:  CT scan of July 31, 2014. FINDINGS: Bony calvarium appears intact. No mass effect or midline shift is noted. Ventricular size is within normal limits. There is no evidence of mass lesion, hemorrhage or acute infarction. IMPRESSION: Normal head CT. Electronically Signed   By: [name], M.D.   On: 09/14/2015 20:19   Dg Chest Port 1 View  09/14/2015  CLINICAL DATA:  AMS, EMS reports wife called EMS for patient altered since this morning. Patient has hx of liver failure per EMS, HISTORY OF ASTHMA, DM, HTN, COPD, COCAINE ABUSE, BEST IMAGES OBTAINED DUE TO PATIENTS CONDITION EXAM: PORTABLE CHEST 1 VIEW COMPARISON:  08/22/2015 FINDINGS: Enlarged cardiac. Lungs are clear. No effusion, infiltrate or pneumothorax. No acute osseous abnormality. IMPRESSION: Cardiomegaly without acute findings. Electronically Signed   By: [name] M.D.   On: 09/14/2015 18:43    Scheduled Meds: . cefTRIAXone (ROCEPHIN)  IV  1 g Intravenous Q24H  . citalopram  20 mg Oral Daily  . insulin aspart  0-9 Units Subcutaneous TID WC  . lactulose  10 g Oral BID  . pantoprazole  40 mg Oral Daily  . rifaximin  550 mg Oral BID  . spironolactone  50 mg Oral Daily  . torsemide  40 mg Oral Daily   Continuous Infusions:     Time spent: 25 minutes    Nyhla Castillo, [place]  [facility] Pager [phone]. If 7PM-7AM, please contact night-coverage at [email or web address], password [facility] 09/16/2015, 12:59 PM  LOS: 2 days

## 2015-09-16 NOTE — Progress Notes (Signed)
Pt requesting vistaril to help with itching. Notified MD will follow orders as given.

## 2015-09-17 ENCOUNTER — Telehealth: Payer: Self-pay | Admitting: Gastroenterology

## 2015-09-17 ENCOUNTER — Other Ambulatory Visit: Payer: Self-pay | Admitting: *Deleted

## 2015-09-17 ENCOUNTER — Inpatient Hospital Stay (HOSPITAL_COMMUNITY): Payer: Medicare Other

## 2015-09-17 DIAGNOSIS — N183 Chronic kidney disease, stage 3 (moderate): Secondary | ICD-10-CM

## 2015-09-17 DIAGNOSIS — Z794 Long term (current) use of insulin: Secondary | ICD-10-CM

## 2015-09-17 DIAGNOSIS — E119 Type 2 diabetes mellitus without complications: Secondary | ICD-10-CM

## 2015-09-17 LAB — URINE CULTURE

## 2015-09-17 LAB — CBC
HEMATOCRIT: 24.6 % — AB (ref 39.0–52.0)
HEMOGLOBIN: 8.5 g/dL — AB (ref 13.0–17.0)
MCH: 33.1 pg (ref 26.0–34.0)
MCHC: 34.6 g/dL (ref 30.0–36.0)
MCV: 95.7 fL (ref 78.0–100.0)
Platelets: 48 10*3/uL — ABNORMAL LOW (ref 150–400)
RBC: 2.57 MIL/uL — AB (ref 4.22–5.81)
RDW: 14.7 % (ref 11.5–15.5)
WBC: 8.3 10*3/uL (ref 4.0–10.5)

## 2015-09-17 LAB — GLUCOSE, CAPILLARY
GLUCOSE-CAPILLARY: 155 mg/dL — AB (ref 65–99)
GLUCOSE-CAPILLARY: 199 mg/dL — AB (ref 65–99)

## 2015-09-17 MED ORDER — RIFAXIMIN 550 MG PO TABS: 550.0000 mg | ORAL_TABLET | Freq: Two times a day (BID) | ORAL | Status: AC

## 2015-09-17 MED ORDER — TORSEMIDE 20 MG PO TABS: 40.0000 mg | ORAL_TABLET | Freq: Every day | ORAL | Status: DC

## 2015-09-17 MED ORDER — CEFUROXIME AXETIL 250 MG PO TABS: 250.0000 mg | ORAL_TABLET | Freq: Two times a day (BID) | ORAL | Status: DC

## 2015-09-17 NOTE — Discharge Summary (Signed)
Physician Discharge Summary  [name] [id number] DOB: 11-09-1960 DOA: 09/14/2015  PCP: [name], MD  Admit date: 09/14/2015 Discharge date: 09/17/2015  Time spent: 35 minutes  Recommendations for Outpatient Follow-up:  1. Follow-up with primary care physician a 1-2 weeks   Discharge Diagnoses:  Active Problems:   Encephalopathy, hepatic (HCC)   Diabetes (HCC)   Anasarca   UTI (lower urinary tract infection)   Altered mental status  COPD Chronic kidney disease stage III  Discharge Condition: Improved  Diet recommendation: Low-salt  Filed Weights   09/14/15 1655 09/14/15 2205 09/17/15 1100  Weight: 136.079 kg (300 lb) 141 kg (310 lb 13.6 oz) 145.741 kg (321 lb 4.8 oz)    History of present illness:  This patient presents to the hospital with altered mental status. He became increasingly confused, lethargic and had not taken any medications for 2 days prior to admission. He had recently decrease his lactulose dosing and was not taking it as prescribed. In the ED, he was found have elevated ammonia. He was admitted for hepatic encephalopathy.  Hospital Course:  Patient was started on increased dose of lactulose and was continued on rifaximin. With treatment, his mental status improved and has now returned to baseline. He was advised to continue his lactulose as prescribed and not skip/decrease dosing. He was also found to have a possible UTI with Escherichia coli. He was treated with Rocephin and has been transitioned to Ceftin. He is afebrile. Attempt was made to consider paracentesis, but ultrasound evaluation did not reveal enough fluid accumulation to safely perform paracentesis. We'll continue the patient on diuretics for now. The remainder of his medical issues remained stable.  Procedures:    Consultations:    Discharge Exam: Filed Vitals:   09/17/15 0700 09/17/15 1412  BP: 130/69 154/63  Pulse: 98 95  Temp: 98.6 F (37 C) 98.1 F (36.7 C)  Resp:  18 18    General: NAD Cardiovascular: s1, S2 RRR Respiratory: CTA B  Discharge Instructions   Discharge Instructions    Diet - low sodium heart healthy    Complete by:  As directed      Increase activity slowly    Complete by:  As directed           Discharge Medication List as of 09/17/2015  4:49 PM    START taking these medications   Details  cefUROXime (CEFTIN) 250 MG tablet Take 1 tablet (250 mg total) by mouth 2 (two) times daily with a meal., Starting 09/17/2015, Until Discontinued, Print      CONTINUE these medications which have CHANGED   Details  rifaximin (XIFAXAN) 550 MG TABS tablet Take 1 tablet (550 mg total) by mouth 2 (two) times daily., Starting 09/17/2015, Until Discontinued, Normal    torsemide (DEMADEX) 20 MG tablet Take 2 tablets (40 mg total) by mouth daily., Starting 09/17/2015, Until Discontinued, Normal      CONTINUE these medications which have NOT CHANGED   Details  albuterol (PROVENTIL HFA;VENTOLIN HFA) 108 (90 BASE) MCG/ACT inhaler Inhale 2 puffs into the lungs every 6 (six) hours as needed. Shortness of breath, Starting 08/14/2014, Until Discontinued, Normal    citalopram (CELEXA) 20 MG tablet Take 1 tablet (20 mg total) by mouth daily., Starting 08/30/2015, Until Discontinued, Normal    cyclobenzaprine (FLEXERIL) 5 MG tablet Take 1 tablet (5 mg total) by mouth at bedtime., Starting 08/01/2015, Until Discontinued, Normal    glipiZIDE (GLUCOTROL) 10 MG tablet TAKE 1 TABLET (10 MG TOTAL)  BY MOUTH DAILY., Normal    hydrOXYzine (ATARAX/VISTARIL) 10 MG tablet TAKE 1 TABLET (10 MG TOTAL) BY MOUTH EVERY 4 (FOUR) HOURS AS NEEDED FOR ITCHING., Normal    Insulin Glargine (LANTUS SOLOSTAR) 100 UNIT/ML Solostar Pen Inject 10 Units into the skin at bedtime., Until Discontinued, Historical Med    Insulin Pen Needle (BD PEN NEEDLE NANO U/F) 32G X 4 MM MISC Inject 1 application into the skin at bedtime., Starting 08/31/2015, Until Discontinued, Normal    lactulose  (CHRONULAC) 10 GM/15ML solution Take 15 mLs (10 g total) by mouth daily. TAKE 15 MLS (10 G TOTAL) BY MOUTH 3 (THREE) TIMES DAILY., Starting 07/17/2015, Until Discontinued, No Print    magnesium oxide (MAG-OX) 400 (241.3 MG) MG tablet Take 1 tablet (400 mg total) by mouth 2 (two) times daily., Starting 07/17/2015, Until Discontinued, Print    omeprazole (PRILOSEC) 20 MG capsule Take 2 capsules (40 mg total) by mouth daily., Starting 08/30/2015, Until Discontinued, Normal    Oxycodone HCl 10 MG TABS Take 1 tablet (10 mg total) by mouth every 6 (six) hours as needed., Starting 08/31/2015, Until Discontinued, Print    spironolactone (ALDACTONE) 50 MG tablet Take 1 tablet (50 mg total) by mouth daily., Starting 07/17/2015, Until Discontinued, Print    zolpidem (AMBIEN) 10 MG tablet Take 1 tablet (10 mg total) by mouth at bedtime as needed for sleep., Starting 09/05/2015, Until Fri 10/05/15, Print      STOP taking these medications     furosemide (LASIX) 40 MG tablet      metolazone (ZAROXOLYN) 5 MG tablet        Allergies  Allergen Reactions  . Penicillins Hives    Has patient had a PCN reaction causing immediate rash, facial/tongue/throat swelling, SOB or lightheadedness with hypotension: Yes Has patient had a PCN reaction causing severe rash involving mucus membranes or skin necrosis: Yes Has patient had a PCN reaction that required hospitalization No Has patient had a PCN reaction occurring within the last 10 years: No If all of the above answers are "NO", then may proceed with Cephalosporin use.       The results of significant diagnostics from this hospitalization (including imaging, microbiology, ancillary and laboratory) are listed below for reference.    Significant Diagnostic Studies: Dg Chest 2 View  08/22/2015  CLINICAL DATA:  Productive cough and shortness of breath for 2 weeks. COPD. Cirrhosis. EXAM: CHEST  2 VIEW COMPARISON:  07/02/2015 FINDINGS: The heart size and mediastinal  contours are within normal limits. Both lungs are clear. The visualized skeletal structures are unremarkable. IMPRESSION: No active cardiopulmonary disease. Electronically Signed   By: Earle Gell M.D.   On: 08/22/2015 17:51   Ct Head Wo Contrast  09/14/2015  CLINICAL DATA:  Altered mental status. EXAM: CT HEAD WITHOUT CONTRAST TECHNIQUE: Contiguous axial images were obtained from the base of the skull through the vertex without intravenous contrast. COMPARISON:  CT scan of July 31, 2014. FINDINGS: Bony calvarium appears intact. No mass effect or midline shift is noted. Ventricular size is within normal limits. There is no evidence of mass lesion, hemorrhage or acute infarction. IMPRESSION: Normal head CT. Electronically Signed   By: Marijo Conception, M.D.   On: 09/14/2015 20:19   US Abdomen Limited  09/17/2015  CLINICAL DATA:  Cirrhosis, ascites, question sufficient ascites for paracentesis EXAM: LIMITED ABDOMEN ULTRASOUND FOR ASCITES TECHNIQUE: Limited ultrasound survey for ascites was performed in all four abdominal quadrants. COMPARISON:  Paracentesis 08/22/2015 FINDINGS: A small  amount of ascites is identified perihepatic and in the low pelvis, predominately medially. Volume of ascites is substantially less than was seen on the previous exam when 2.3 L was removed. No adequate collection in the flanks or lower quadrants is identified to suggest significant clinical benefit to repeat paracentesis at this time. IMPRESSION: Scattered ascites predominately perihepatic and in the pelvis, insufficient for expected therapeutic benefit from paracentesis at this time. Discussed with Dr. Roderic Palau. Electronically Signed   By: Lavonia Dana M.D.   On: 09/17/2015 13:45   US Abdomen Limited  08/22/2015  CLINICAL DATA:  Increased abdominal distention. Ascites. Cirrhosis. EXAM: LIMITED ABDOMEN ULTRASOUND FOR ASCITES TECHNIQUE: Limited ultrasound survey for ascites was performed in all four abdominal quadrants. COMPARISON:   None. FINDINGS: Moderate ascites is seen in all 4 abdominal quadrants. IMPRESSION: Moderate ascites visualized in all 4 abdominal quadrants. Electronically Signed   By: Earle Gell M.D.   On: 08/22/2015 16:23   US Paracentesis  08/22/2015  CLINICAL DATA:  Cirrhosis, ascites, abdominal swelling EXAM: ULTRASOUND GUIDED DIAGNOSTIC AND THERAPEUTIC PARACENTESIS COMPARISON:  None PROCEDURE: Procedure, benefits, and risks of procedure were discussed with patient. Written informed consent for procedure was obtained. Time out protocol followed. Adequate collection of ascites localized by ultrasound in LEFT lower quadrant. Skin prepped and draped in usual sterile fashion. Skin and soft tissues anesthetized with 10 mL of 1% lidocaine. 5 [place] Yueh catheter placed into peritoneal cavity. 2300 mL of yellow ascites aspirated by vacuum bottle suction. Procedure tolerated well by patient without immediate complication. COMPLICATIONS: None. FINDINGS: A total of approximately 2300 mL of ascitic fluid was removed. A fluid sample of 180 mL was sent for laboratory analysis. IMPRESSION: Successful ultrasound guided paracentesis yielding 2300 mL of ascites. Electronically Signed   By: Lavonia Dana M.D.   On: 08/22/2015 17:29   Dg Chest Port 1 View  09/14/2015  CLINICAL DATA:  AMS, EMS reports wife called EMS for patient altered since this morning. Patient has hx of liver failure per EMS, HISTORY OF ASTHMA, DM, HTN, COPD, COCAINE ABUSE, BEST IMAGES OBTAINED DUE TO PATIENTS CONDITION EXAM: PORTABLE CHEST 1 VIEW COMPARISON:  08/22/2015 FINDINGS: Enlarged cardiac. Lungs are clear. No effusion, infiltrate or pneumothorax. No acute osseous abnormality. IMPRESSION: Cardiomegaly without acute findings. Electronically Signed   By: Suzy Bouchard M.D.   On: 09/14/2015 18:43    Microbiology: Recent Results (from the past 240 hour(s))  Urine culture     Status: None   Collection Time: 09/14/15  6:02 PM  Result Value Ref Range Status    Specimen Description URINE, CATHETERIZED  Final   Special Requests NONE  Final   Culture   Final    >=100,000 COLONIES/mL ESCHERICHIA COLI Performed at [facility]    Report Status 09/17/2015 FINAL  Final   Organism ID, Bacteria ESCHERICHIA COLI  Final      Susceptibility   Escherichia coli - MIC*    AMPICILLIN >=32 RESISTANT Resistant     CEFAZOLIN <=4 SENSITIVE Sensitive     CEFTRIAXONE <=1 SENSITIVE Sensitive     CIPROFLOXACIN >=4 RESISTANT Resistant     GENTAMICIN <=1 SENSITIVE Sensitive     IMIPENEM <=0.25 SENSITIVE Sensitive     NITROFURANTOIN <=16 SENSITIVE Sensitive     TRIMETH/SULFA <=20 SENSITIVE Sensitive     AMPICILLIN/SULBACTAM 16 INTERMEDIATE Intermediate     PIP/TAZO 8 SENSITIVE Sensitive     * >=100,000 COLONIES/mL ESCHERICHIA COLI     Labs: Basic Metabolic Panel:  Recent Labs  Lab 09/14/15 1731 09/16/15 0640  NA 139 135  K 3.8 3.7  CL 102 100*  CO2 26 25  GLUCOSE 165* 166*  BUN 53* 49*  CREATININE 2.01* 1.89*  CALCIUM 8.5* 8.3*  MG 1.7  --    Liver Function Tests:  Recent Labs Lab 09/14/15 1731 09/16/15 0640  AST 88* 92*  ALT 37 37  ALKPHOS 86 72  BILITOT 3.0* 2.9*  PROT 6.6 6.2*  ALBUMIN 2.6* 2.4*   No results for input(s): LIPASE, AMYLASE in the last 168 hours.  Recent Labs Lab 09/14/15 1731  AMMONIA 65*   CBC:  Recent Labs Lab 09/14/15 1731 09/16/15 0640 09/17/15 0557  WBC 5.4 5.8 8.3  HGB 8.8* 9.1* 8.5*  HCT 25.6* 26.8* 24.6*  MCV 95.2 96.8 95.7  PLT 58* 63* 48*   Cardiac Enzymes:  Recent Labs Lab 09/14/15 1731  TROPONINI <0.03   BNP: BNP (last 3 results)  Recent Labs  06/11/15 1546  BNP 89.0    ProBNP (last 3 results) No results for input(s): PROBNP in the last 8760 hours.  CBG:  Recent Labs Lab 09/16/15 1148 09/16/15 1649 09/16/15 2026 09/17/15 0804 09/17/15 1142  GLUCAP 193* 222* 156* 155* 199*       Signed:  Johathon Overturf MD.  [facility] 09/17/2015, 7:39 PM

## 2015-09-17 NOTE — Care Management Note (Signed)
Case Management Note  Patient Details  Name: CHADRICK PFAFF MRN: [id number] Date of Birth: January 29, 1961  Subjective/Objective:     Spoke with patient and spouse for discharge planning. Patient is from home with spouse and is normally fairly independent.     Mental status has returned to his baseline and he answers questions appropriately. Uses a walker "sometimes"    At home. Has home health through [place]    Action/Plan:  Resume HH at discharge.   Expected Discharge Date:                  Expected Discharge Plan:  [place]  In-House Referral:     Discharge planning Services  CM Consult  Post Acute Care Choice:    Choice offered to:  Patient  DME Arranged:    DME Agency:     HH Arranged:  RN [place] Agency:  [place]  Status of Service:  Completed, signed off  Medicare Important Message Given:    Date Medicare IM Given:    Medicare IM give by:    Date Additional Medicare IM Given:    Additional Medicare Important Message give by:     If discussed at [place] of Stay Meetings, dates discussed:    Additional Comments:  Alvie Heidelberg, RN 09/17/2015, 2:49 PM

## 2015-09-17 NOTE — Progress Notes (Signed)
Notified Dr. Roderic Palau that the pt was inquiring if he would go home today.

## 2015-09-17 NOTE — Progress Notes (Signed)
ANTIBIOTIC CONSULT NOTE   Pharmacy Consult for rocephin Indication: UTI  Allergies  Allergen Reactions  . Penicillins Hives    Has patient had a PCN reaction causing immediate rash, facial/tongue/throat swelling, SOB or lightheadedness with hypotension: Yes Has patient had a PCN reaction causing severe rash involving mucus membranes or skin necrosis: Yes Has patient had a PCN reaction that required hospitalization No Has patient had a PCN reaction occurring within the last 10 years: No If all of the above answers are "NO", then may proceed with Cephalosporin use.     Patient Measurements: Height: 6' (182.9 cm) Weight: (!) 310 lb 13.6 oz (141 kg) IBW/kg (Calculated) : 77.6  Temp: 98.2 F (36.8 C) (02/06 0556) Temp Source: Oral (02/06 0556) BP: 184/70 mmHg (02/06 0556) Pulse Rate: 96 (02/06 0556) Intake/Output from previous day: 02/05 0701 - 02/06 0700 In: -  Out: 1101 [Urine:1100; Stool:1] Intake/Output from this shift:    Labs:  Recent Labs  09/14/15 1731 09/16/15 0640 09/17/15 0557  WBC 5.4 5.8 8.3  HGB 8.8* 9.1* 8.5*  PLT 58* 63* 48*  CREATININE 2.01* 1.89*  --    Estimated Creatinine Clearance: 65.9 mL/min (by C-G formula based on Cr of 1.89). No results for input(s): VANCOTROUGH, VANCOPEAK, VANCORANDOM, GENTTROUGH, GENTPEAK, GENTRANDOM, TOBRATROUGH, TOBRAPEAK, TOBRARND, AMIKACINPEAK, AMIKACINTROU, AMIKACIN in the last 72 hours.   Microbiology: Recent Results (from the past 720 hour(s))  Culture, body fluid-bottle     Status: None   Collection Time: 08/22/15  4:45 PM  Result Value Ref Range Status   Specimen Description ASCITIC  Final   Special Requests BOTTLES DRAWN AEROBIC AND ANAEROBIC 8CC EACH  Final   Culture NO GROWTH 5 DAYS  Final   Report Status 08/27/2015 FINAL  Final  Gram stain     Status: None   Collection Time: 08/22/15  4:45 PM  Result Value Ref Range Status   Specimen Description ASCITIC  Final   Special Requests NONE  Final   Gram Stain  NO ORGANISMS SEEN RARE WBC SEEN   Final   Report Status 08/22/2015 FINAL  Final  Urine culture     Status: Abnormal   Collection Time: 08/31/15  4:40 PM  Result Value Ref Range Status   Urine Culture, Routine Final report (A)  Final   Urine Culture result 1 Escherichia coli (A)  Final    Comment: Greater than 100,000 colony forming units per mL   ANTIMICROBIAL SUSCEPTIBILITY Comment  Final    Comment:       ** S = Susceptible; I = Intermediate; R = Resistant **                    P = Positive; N = Negative             MICS are expressed in micrograms per mL    Antibiotic                 RSLT#1    RSLT#2    RSLT#3    RSLT#4 Amoxicillin/Clavulanic Acid    R Ampicillin                     I Cefepime                       S Ceftriaxone                    S Cefuroxime  R Cephalothin                    R Ciprofloxacin                  R Ertapenem                      S Gentamicin                     S Imipenem                       S Levofloxacin                   R Nitrofurantoin                 S Piperacillin                   S Tetracycline                   S Tobramycin                     S Trimethoprim/Sulfa             S   Urine culture     Status: None (Preliminary result)   Collection Time: 09/14/15  6:02 PM  Result Value Ref Range Status   Specimen Description URINE, CATHETERIZED  Final   Special Requests NONE  Final   Culture   Final    >=100,000 COLONIES/mL ESCHERICHIA COLI Performed at [facility]    Report Status PENDING  Incomplete    Medical History: Past Medical History  Diagnosis Date  . Hypertension   . Diabetes mellitus   . GERD (gastroesophageal reflux disease)     DEC 2010 EGD/Bx REACTIVE GASTROPATHY, NO VARICES  . Hemorrhoids, internal   . BMI 40.0-44.9, adult ([place]) OCT 2010 269 LBS    APR 2012 279 LBS AUG 2014 185 LBS  . Cirrhosis ([place]) NOV 2010 CHILD PUGH A    ETOH/HCV/OBESITY  . IV drug abuse REMOTE  . Hepatitis  2010 HEP C    AST 509 ALT 267 ALK PHOS 165 ALB 3.8 NEG IGM HAV/HBSAg.   . Gallstone AUG 2012 1 CM  . GERD 10/25/2009  . COPD (chronic obstructive pulmonary disease) ([place])   . Hepatitis C     failed interferon/ribavirin. treated with ribavirin/sofosbuvir/declastasvir 2016, ?failure or early relapse unable to determine because patient was noncompliant.  . Pancytopenia ([place]) 2013  . Splenomegaly 2013  . Other pancytopenia ([place]) 02/18/2013  . Iron (Fe) deficiency anemia 02/18/2013  . Splenomegaly 02/18/2013  . Neuropathy ([place])   . Biliary dyskinesia JUL 2015    HIDA GB EF 5%  . Chronic pain   . Chronic leg pain     left  . Portal venous hypertension (H[facility])   . Asthma   . Medically noncompliant 05/30/2015  . Renal insufficiency   . Cocaine abuse     Medications:  See medication history Assessment: 55 yo man to start rocephin for UTI.  He has a PCN allergy but has received rocephin in the past.  UC with E coli  Goal of Therapy:  Eradication of infection  Plan:  Rocephin 1 gm IV q24 hours F/u cultures and clinical course  Romy Ipock [place] 09/17/2015,7:59 AM

## 2015-09-17 NOTE — Patient Outreach (Signed)
09/17/15- Patient admitted to hospital on 09/14/15 for altered mental status, RN CM notified hospital liason Kandis Mannan RN and [facility] pharmacist.  [name] [facility], BSN [place] Coordinator [phone]

## 2015-09-17 NOTE — Telephone Encounter (Signed)
Wfe, [name] called, to let us know patient is in the hospital and she is going to drop off more papers for patient to get his liver medication and said that there were 2 pages that SF needed to fill out. She said that patient is out of his liver medicine at home and would also pick up his samples when she comes by.

## 2015-09-17 NOTE — Patient Outreach (Signed)
[facility]) Care Management  [facility] CM Pharmacy   09/17/2015 Late entry for visit on 09/13/14 [name] 1961/01/16 [id number]  Subjective: I completed a home visit on 09/13/14 and met with Mr. Quinter wife.  Mr. Gosselin was not available due to having a stomach bug.  He was in a separate room asleep.  Mrs. Berzins does have permission to meet with me and discussion Mr. Pagliuca health care concerns.  She is on his consent form.  I am making a home visit today to assist with the Xifaxan patient assistance form.  Mrs. Falls has all the documents and see just needed help making sure the information was correct.  I completed the form with her and instructed her that she needed to take it to the provider that prescribes the Xifaxan to have them complete the prescription section.  I also stated Mr. Michon also needed to sign the form.  She stated she would have him sign the form and that she would be going up to the office on Monday to pick up samples so she would drop off the form for them to sign.  I stated if she had all the paper work together at that time, they could mail it after they signed it and she would just have to wait for the company to let her know if he qualified.  She stated she may do that.  I went over all of Mr. Tafoya medications and she stated exactly how she was giving them to him.  She stated she had run out of Torsemide and was giving him furosemide until she could pick up the Torsemide on Monday.    Objective:   Current Medications: No current facility-administered medications for this visit.   No current outpatient prescriptions on file.   Facility-Administered Medications Ordered in Other Visits  Medication Dose Route Frequency Provider Last Rate Last Dose  . albuterol (PROVENTIL) (2.5 MG/3ML) 0.083% nebulizer solution 3 mL  3 mL Inhalation Q6H PRN Houston Siren, MD   3 mL at 09/15/15 1231  . cefTRIAXone (ROCEPHIN) 1 g in dextrose 5 % 50 mL IVPB  1 g  Intravenous Q24H Samuel Jester, DO 100 mL/hr at 09/16/15 2027 1 g at 09/16/15 2027  . citalopram (CELEXA) tablet 20 mg  20 mg Oral Daily Houston Siren, MD   20 mg at 09/17/15 0912  . hydrOXYzine (ATARAX/VISTARIL) tablet 10 mg  10 mg Oral TID PRN Eddie North, MD   10 mg at 09/16/15 1538  . insulin aspart (novoLOG) injection 0-9 Units  0-9 Units Subcutaneous TID WC Zannie Cove, MD   2 Units at 09/17/15 0818  . lactulose (CHRONULAC) 10 GM/15ML solution 10 g  10 g Oral BID Houston Siren, MD   10 g at 09/17/15 0912  . ondansetron (ZOFRAN) injection 4 mg  4 mg Intravenous Q6H PRN Nishant Dhungel, MD      . oxyCODONE (Oxy IR/ROXICODONE) immediate release tablet 5 mg  5 mg Oral Q6H PRN Zannie Cove, MD   5 mg at 09/17/15 0912  . pantoprazole (PROTONIX) EC tablet 40 mg  40 mg Oral Daily Houston Siren, MD   40 mg at 09/17/15 0912  . rifaximin (XIFAXAN) tablet 550 mg  550 mg Oral BID Houston Siren, MD   550 mg at 09/17/15 5433  . spironolactone (ALDACTONE) tablet 50 mg  50 mg Oral Daily Houston Siren, MD   50 mg at 09/17/15 0912  . torsemide (DEMADEX) tablet 40 mg  40  mg Oral Daily Orvan Falconer, MD   40 mg at 09/17/15 0912  . traZODone (DESYREL) tablet 50 mg  50 mg Oral QHS PRN Nishant Dhungel, MD   50 mg at 09/16/15 2126    Functional Status: In your present state of health, do you have any difficulty performing the following activities: 09/15/2015 08/23/2015  Hearing? N N  Vision? N N  Difficulty concentrating or making decisions? Y N  Walking or climbing stairs? Y N  Dressing or bathing? N N  Doing errands, shopping? Y Y  Preparing Food and eating ? - -  Using the Toilet? - -  In the past six months, have you accidently leaked urine? - -  Do you have problems with loss of bowel control? - -  Managing your Medications? - -  Managing your Finances? - -  Housekeeping or managing your Housekeeping? - -    Fall/Depression Screening: PHQ 2/9 Scores 08/31/2015 07/27/2015 07/24/2015 04/24/2015 01/26/2015 06/27/2014  PHQ -  2 Score '1 6 6 '$ 0 0 0  PHQ- 9 Score - 24 24 - - -    Assessment:  1.  Xifaxan: Patient assistance form completed.  [name] is supposed to take it to the provider's office before mailing it to the company.  2.  Medication adherence: Mr. Sam seems to be adherent to his medications based on his wife's responses.  Plan: 1.  I helped complete the Xifaxan application with Mrs. Chancy.   2.  Based on Mrs. Broxson's answer, Mr. Angerer seems to be adherent to his medications. 3.  I have communicated my findings to [name], RN nurse care manager.  I will close the case to pharmacy since all his pharmacy needs have been met.  I am happy to assist if future pharmacy needs arise.   Deanne Coffer, PharmD, [place] [phone]

## 2015-09-17 NOTE — Progress Notes (Signed)
Patient discharged with instructions, prescription, and care notes.  Verbalized understanding via teach back.  IV was removed and the site was WNL. Patient voiced no further complaints or concerns at the time of discharge.  Appointments scheduled per instructions.  Patient left the floor via w/c with staff and family in stable condition. 

## 2015-09-18 ENCOUNTER — Encounter: Payer: Self-pay | Admitting: *Deleted

## 2015-09-18 ENCOUNTER — Telehealth: Payer: Self-pay | Admitting: Family Medicine

## 2015-09-18 ENCOUNTER — Other Ambulatory Visit: Payer: Self-pay | Admitting: *Deleted

## 2015-09-18 LAB — URINE CULTURE: Culture: NO GROWTH

## 2015-09-18 MED ORDER — ALBUTEROL SULFATE HFA 108 (90 BASE) MCG/ACT IN AERS: 2.0000 | INHALATION_SPRAY | Freq: Four times a day (QID) | RESPIRATORY_TRACT | Status: AC | PRN

## 2015-09-18 MED ORDER — GLUCOSE BLOOD VI STRP: ORAL_STRIP | Status: AC

## 2015-09-18 NOTE — Telephone Encounter (Signed)
Pt aware to come grab test strips and rx for test strips and albuterol inhaler sent to [facility] in [place].

## 2015-09-18 NOTE — Telephone Encounter (Signed)
Samples had been left previously and never picked up. Some still in drawer.

## 2015-09-18 NOTE — Telephone Encounter (Signed)
REVIEWED-NO ADDITIONAL RECOMMENDATIONS. 

## 2015-09-18 NOTE — Patient Outreach (Signed)
[facility]) Care Management   09/18/2015  [name] [birth year] [id number]  Ghazi Bausman Brinkmeier is an 55 y.o. male  Subjective: Routine home visit/ transition of care week 1, with pt, wife present, HIPAA verified, wife states "he hates taking the lactulose and that gets him in trouble"  Reports "his belly is getting bigger but they said wasn't enough fluid to draw off yet".  Pt continues to weigh daily and home health RN, PT continues to work with pt, RN is measuring patient's legs at each visit. Pt reports he is not candidate for liver transplant. Pt states he is tired most of the time, continues having issues with cramping and says " nothing has worked for this, we've tried everything"  Objective:   Filed Vitals:   09/18/15 1326  BP: 138/62  Pulse: 90  Resp: 18  Weight: 307 lb (139.254 kg)  SpO2: 98%  CBG 150, 129 for last readings, pt has no glucometer strips today. ROS  Physical Exam  Constitutional: He is oriented to person, place, and time. He appears well-developed and well-nourished.  HENT:  Head: Normocephalic.  Neck: Normal range of motion. Neck supple.  Cardiovascular: Normal rate and regular rhythm.   Respiratory: Effort normal and breath sounds normal.  GI: Soft. Bowel sounds are normal.  Musculoskeletal: Normal range of motion. He exhibits edema.  3+edema right lower extremity 2+ edema left lower extremity  Neurological: He is alert and oriented to person, place, and time.  Skin: Skin is warm and dry.  Psychiatric: He has a normal mood and affect. His behavior is normal. Judgment and thought content normal.    Current Medications:   Current Outpatient Prescriptions  Medication Sig Dispense Refill  . cefUROXime (CEFTIN) 250 MG tablet Take 1 tablet (250 mg total) by mouth 2 (two) times daily with a meal. 8 tablet 0  . citalopram (CELEXA) 20 MG tablet Take 1 tablet (20 mg total) by mouth daily. 90 tablet 0  . cyclobenzaprine (FLEXERIL) 5 MG tablet  Take 1 tablet (5 mg total) by mouth at bedtime. 30 tablet 0  . glipiZIDE (GLUCOTROL) 10 MG tablet TAKE 1 TABLET (10 MG TOTAL) BY MOUTH DAILY. 90 tablet 0  . hydrOXYzine (ATARAX/VISTARIL) 10 MG tablet TAKE 1 TABLET (10 MG TOTAL) BY MOUTH EVERY 4 (FOUR) HOURS AS NEEDED FOR ITCHING. 30 tablet 1  . Insulin Glargine (LANTUS SOLOSTAR) 100 UNIT/ML Solostar Pen Inject 10 Units into the skin at bedtime.    . Insulin Pen Needle (BD PEN NEEDLE NANO U/F) 32G X 4 MM MISC Inject 1 application into the skin at bedtime. 100 each 11  . lactulose (CHRONULAC) 10 GM/15ML solution Take 15 mLs (10 g total) by mouth daily. TAKE 15 MLS (10 G TOTAL) BY MOUTH 3 (THREE) TIMES DAILY. 473 mL 1  . magnesium oxide (MAG-OX) 400 (241.3 MG) MG tablet Take 1 tablet (400 mg total) by mouth 2 (two) times daily. 60 tablet 1  . omeprazole (PRILOSEC) 20 MG capsule Take 2 capsules (40 mg total) by mouth daily. 180 capsule 1  . rifaximin (XIFAXAN) 550 MG TABS tablet Take 1 tablet (550 mg total) by mouth 2 (two) times daily. 60 tablet 5  . spironolactone (ALDACTONE) 50 MG tablet Take 1 tablet (50 mg total) by mouth daily. 30 tablet 3  . torsemide (DEMADEX) 20 MG tablet Take 2 tablets (40 mg total) by mouth daily. 30 tablet 1  . zolpidem (AMBIEN) 10 MG tablet Take 1 tablet (10 mg total)  by mouth at bedtime as needed for sleep. 15 tablet 1  . albuterol (PROVENTIL HFA;VENTOLIN HFA) 108 (90 BASE) MCG/ACT inhaler Inhale 2 puffs into the lungs every 6 (six) hours as needed. Shortness of breath (Patient not taking: Reported on 09/18/2015) 1 Inhaler 1  . Oxycodone HCl 10 MG TABS Take 1 tablet (10 mg total) by mouth every 6 (six) hours as needed. (Patient not taking: Reported on 09/18/2015) 120 tablet 0   No current facility-administered medications for this visit.    Functional Status:   In your present state of health, do you have any difficulty performing the following activities: 09/15/2015 08/23/2015  Hearing? N N  Vision? N N  Difficulty  concentrating or making decisions? Y N  Walking or climbing stairs? Y N  Dressing or bathing? N N  Doing errands, shopping? Y Y  Preparing Food and eating ? - -  Using the Toilet? - -  In the past six months, have you accidently leaked urine? - -  Do you have problems with loss of bowel control? - -  Managing your Medications? - -  Managing your Finances? - -  Housekeeping or managing your Housekeeping? - -    Fall/Depression Screening:    PHQ 2/9 Scores 08/31/2015 07/27/2015 07/24/2015 04/24/2015 01/26/2015 06/27/2014  PHQ - 2 Score 1 6 6  0 0 0  PHQ- 9 Score - 24 24 - - -    Assessment:  RN CM sent In Basket to [place] pharmacist reporting pt unable to afford verio glucometer strips and albuterol, see care plan- called primary MD office, spoke with Caren Griffins, reported pt does not have strips or albuterol, they will provide samples of strips for pt. RN CM discussed end of life issues and hospice, pt not interested in hospice yet, also does not want to complete advanced directives at present.  RN CM gave book "Hard Choices for [employer]" and pt, wife state they will read.  Pt to follow up with primary MD on 09/29/15.  RN CM placed emphasis on action plan and medication adherence. RN CM faxed today's visit note to primary MD Dr. Sabra Heck, faxed barrier letter as well and requested prescription for verio one touch glucometer strips and albuterol inhaler 108/90 be faxed to [facility] in [place].  Plan: continue weekly transition of care calls See pt for home visit 10/12/15  [name] [facility], [place] Coordinator [phone]

## 2015-09-19 ENCOUNTER — Telehealth: Payer: Self-pay | Admitting: *Deleted

## 2015-09-19 MED ORDER — SILVER SULFADIAZINE 1 % EX CREA: 1.0000 "application " | TOPICAL_CREAM | Freq: Every day | CUTANEOUS | Status: AC

## 2015-09-19 NOTE — Telephone Encounter (Signed)
Burn that has been there for awhile, he doesn't know how or when. Home health nurse examined and says no infecftion. We will evaluate at appt next week per Dr Sabra Heck

## 2015-09-20 ENCOUNTER — Other Ambulatory Visit: Payer: Self-pay | Admitting: Family Medicine

## 2015-09-20 NOTE — Telephone Encounter (Signed)
Last seen 08/31/15  Dr Sabra Heck  If approved route to nurse to call into [facility]

## 2015-09-24 ENCOUNTER — Telehealth: Payer: Self-pay

## 2015-09-24 ENCOUNTER — Other Ambulatory Visit: Payer: Self-pay | Admitting: *Deleted

## 2015-09-24 NOTE — Telephone Encounter (Signed)
I called [name] @ [phone] and informed her.  I also called and informed pt's wife [name].   Faxed to orders to [name] in [place] to look over.

## 2015-09-24 NOTE — Patient Outreach (Signed)
09/24/15- Telephone call for transition of care week 2, spoke with pt, HIPAA verified, pt states he weighs 308 pounds today and has gained 8 pounds in 10 days, is more dyspneic, and "belly is getting bigger".  Pt states home health continues seeing him weekly, pt has upcoming appointments with primary MD Dr. Sabra Heck and with GI MD [name] (pt states this MD handles any issues with the cirrhosis and swelling, etc). Weight today 308 pounds and pt reports dyspnea, he feels his belly is larger,pt reports compliance with fluid restrictions and medications. See care plan- RN CM called [name] office, reported lower extremity edema and pt feels his belly is getting larger, nurse from [name] called back and states MD wants pt to have IV lasix and albumin outpatient x 3 days and she will notify patient's wife, MD does not want pt to have paracentesis at this time. RN CM talked with patient's wife [name] and she states Dr. Nona Dell office called her and they are in process of setting up IV lasix. RN CM faxed today's note to Dr. Sabra Heck.  [facility] CM Care Plan Problem One        Most Recent Value   Care Plan Problem One  Pt high risk for hospitalization related to disease process   Role Documenting the Problem One  Care Management Coordinator   Care Plan for Problem One  Active   [facility] Long Term Goal (31-90 days)  pt will have no hospitalizations within 45 days.   [facility] Long Term Goal Start Date  09/18/15   Interventions for Problem One Long Term Goal  RN CM reviewed action plan and importance of reporting change in health status early (such as weight gain) RN CM called [name] office, spoke with Freida Busman and reported pt states he has lower extremity edema that is getting worse, more dyspneic, not sure if pt may need paracentesis, pt is 8 pounds heavier since 09/14/15, [name] office to call back.   [facility] CM Short Term Goal #1 (0-30 days)  pt will report increase endurance within 30 days.   [facility] CM Short Term Goal #1  Start Date  09/18/15   Interventions for Short Term Goal #1  RN CM reminded pt to complete exercises prescribed by PT   [facility] CM Short Term Goal #2 Start Date  -- [goal restarted]   [facility] CM Short Term Goal #2 Met Date  -- [remove from careplan]    [facility] CM Care Plan Problem Two        Most Recent Value   Care Plan Problem Two  Knowledge deficit related to medication regimen   Role Documenting the Problem Two  Care Management Coordinator   Care Plan for Problem Two  Active   [facility] CM Short Term Goal #1 (0-30 days)  pt will have all medications on hand within 30 days   [facility] CM Short Term Goal #1 Start Date  09/18/15   Interventions for Short Term Goal #2   RN CM reviewed importance of taking medications as prescribed and importance of lactulose to decrease ammonia levels, pt reports he has all medications on hand.      [name] [facility], [place] Coordinator [phone]

## 2015-09-24 NOTE — Telephone Encounter (Signed)
[name] from the [facility] called office and wanted to report pt weight gain.    States that the pt has had the following weight gain:  09/06/2015--297lb  09/14/2015--300lb  09/24/2015--308lb  States that the patient is complaining of shortness of breath and thinks he needs to have a para.    Routing to [facility] for review

## 2015-09-24 NOTE — Telephone Encounter (Signed)
PLEASE CALL [facility]. PT ALWAYS THINKS HE NEEDS TO HAVE A PARACENTESIS. HIS LATS PARACENTESIS WAS FEB 6 AND THEY ONLY REMOVED 2.3 LS OF FLUID. [place] HE WAS 321 LBS.  HIS ANASARCA IS MOST LIKELY CAUSING HIS SOB. HE HAS CHRONIC RENAL INSUFFICIENCY AND HIS DIURETIC MANAGEMENT IS CHALLENGING.  HE CAN GET LASIX 40 MG IV WITH ALBUMIN 25 GMS IV DAILY FOR 3 DAYS IN THE INFUSION CLINIC. HE NEEDS TO WAIT UNTIL FEB 20 BEFORE WE COULD CONSIDER ANOTHER PARACENTESIS.

## 2015-09-25 ENCOUNTER — Telehealth: Payer: Self-pay | Admitting: Gastroenterology

## 2015-09-25 NOTE — Telephone Encounter (Signed)
[name] is aware. She said they have called and got him scheduled for the next 3 days at 11:00 Am. She is aware of pt's OV appt with [name] on 09/27/2015 at 9:00 AM and said she will bring the Patient Assistance papers when she comes to that appt.

## 2015-09-25 NOTE — Telephone Encounter (Signed)
Faxed the orders to [facility].

## 2015-09-25 NOTE — Telephone Encounter (Signed)
ORDERS SIGNED THIS AM. GINA IS AWARE PT NEEDS INFUSION ASAP. CAL GAIL AND LET HER KNOW.

## 2015-09-25 NOTE — Telephone Encounter (Signed)
Pt's wife called to say that we were supposed to have called her back and she hasn't heard from [place]. I told DS the patient's wife was on the phone and she told me to tell her that the hospital would be contacting her. Pt's wife said that she needed to know something and was going to call the hospital and call us back.

## 2015-09-25 NOTE — Telephone Encounter (Signed)
Pt's wife, [name], called and asked about the infusions. I had told Manuela Schwartz to tell her the hospital would be calling her. She called back and said the hospital told her that the infusion would be done at Dr. Nona Dell office. I told her no, I would call the hospital and find out what is going. I called and spoke to [place] who said one of the nurses was going to talk to [name] about it and they will get in touch with the pt after that. I called the pt back and told her to expect a call from the hospital.

## 2015-09-25 NOTE — Telephone Encounter (Signed)
See note from 09/24/2015.

## 2015-09-26 ENCOUNTER — Encounter (HOSPITAL_COMMUNITY)
Admission: RE | Admit: 2015-09-26 | Discharge: 2015-09-26 | Disposition: A | Discharge status: Home / Self Care | Payer: Medicare Other | Source: Ambulatory Visit | Attending: Gastroenterology | Admitting: Gastroenterology

## 2015-09-26 DIAGNOSIS — K7460 Unspecified cirrhosis of liver: Secondary | ICD-10-CM | POA: Diagnosis not present

## 2015-09-26 MED ORDER — FUROSEMIDE 10 MG/ML IJ SOLN
40.0000 mg | Freq: Every day | INTRAMUSCULAR | Status: DC
  Administered 2015-09-26: 40 mg via INTRAVENOUS
  Filled 2015-09-26: qty 4

## 2015-09-26 MED ORDER — ALBUMIN HUMAN 25 % IV SOLN
12.5000 g | Freq: Once | INTRAVENOUS | Status: AC
  Administered 2015-09-26: 12.5 g via INTRAVENOUS
  Filled 2015-09-26: qty 50

## 2015-09-26 MED ORDER — SODIUM CHLORIDE 0.9% FLUSH: 10.0000 mL | INTRAVENOUS | Status: DC | PRN

## 2015-09-26 MED ORDER — SODIUM CHLORIDE 0.9 % IV SOLN
INTRAVENOUS | Status: DC
  Administered 2015-09-26: 11:00:00 via INTRAVENOUS

## 2015-09-27 ENCOUNTER — Ambulatory Visit (HOSPITAL_COMMUNITY)
Admission: RE | Admit: 2015-09-27 | Discharge: 2015-09-27 | Disposition: A | Discharge status: Home / Self Care | Payer: Medicare Other | Source: Ambulatory Visit | Attending: Gastroenterology | Admitting: Gastroenterology

## 2015-09-27 ENCOUNTER — Encounter (HOSPITAL_COMMUNITY): Payer: Self-pay

## 2015-09-27 ENCOUNTER — Encounter: Payer: Self-pay | Admitting: Gastroenterology

## 2015-09-27 ENCOUNTER — Encounter (HOSPITAL_COMMUNITY)
Admission: RE | Admit: 2015-09-27 | Discharge: 2015-09-27 | Disposition: A | Discharge status: Home / Self Care | Payer: Medicare Other | Source: Ambulatory Visit | Attending: Gastroenterology | Admitting: Gastroenterology

## 2015-09-27 ENCOUNTER — Ambulatory Visit (INDEPENDENT_AMBULATORY_CARE_PROVIDER_SITE_OTHER): Payer: Medicare Other | Admitting: Gastroenterology

## 2015-09-27 ENCOUNTER — Other Ambulatory Visit: Payer: Self-pay

## 2015-09-27 VITALS — BP 135/63 | HR 81 | Temp 96.5°F | Ht 66.0 in | Wt 316.8 lb

## 2015-09-27 DIAGNOSIS — R601 Generalized edema: Secondary | ICD-10-CM

## 2015-09-27 DIAGNOSIS — K7460 Unspecified cirrhosis of liver: Secondary | ICD-10-CM

## 2015-09-27 DIAGNOSIS — R188 Other ascites: Secondary | ICD-10-CM | POA: Insufficient documentation

## 2015-09-27 DIAGNOSIS — K7689 Other specified diseases of liver: Secondary | ICD-10-CM | POA: Diagnosis not present

## 2015-09-27 DIAGNOSIS — M25512 Pain in left shoulder: Secondary | ICD-10-CM | POA: Insufficient documentation

## 2015-09-27 DIAGNOSIS — R296 Repeated falls: Secondary | ICD-10-CM | POA: Diagnosis present

## 2015-09-27 DIAGNOSIS — B182 Chronic viral hepatitis C: Secondary | ICD-10-CM

## 2015-09-27 DIAGNOSIS — R161 Splenomegaly, not elsewhere classified: Secondary | ICD-10-CM | POA: Insufficient documentation

## 2015-09-27 DIAGNOSIS — R1012 Left upper quadrant pain: Secondary | ICD-10-CM

## 2015-09-27 MED ORDER — SODIUM CHLORIDE 0.9 % IV SOLN
Freq: Once | INTRAVENOUS | Status: AC
  Administered 2015-09-27: 250 mL via INTRAVENOUS

## 2015-09-27 MED ORDER — FUROSEMIDE 10 MG/ML IJ SOLN
40.0000 mg | Freq: Once | INTRAMUSCULAR | Status: AC
  Administered 2015-09-27: 40 mg via INTRAVENOUS
  Filled 2015-09-27: qty 4

## 2015-09-27 MED ORDER — ALBUMIN HUMAN 25 % IV SOLN
25.0000 g | Freq: Once | INTRAVENOUS | Status: AC
  Administered 2015-09-27: 25 g via INTRAVENOUS
  Filled 2015-09-27: qty 100

## 2015-09-27 NOTE — Assessment & Plan Note (Signed)
DUE TO CRI AND LOW ALBUMIN/CIRRHOSIS. RESPONDING TO LASIX/ALBUMIN  COMPLETE LASIX AND ALBUMIN INFUSIONS. REPEAT CMP ON MON FEB 20. FOLLOW UP IN 2 MOS.

## 2015-09-27 NOTE — Progress Notes (Signed)
Quick Note:  LMOM to call. ______ 

## 2015-09-27 NOTE — Patient Instructions (Signed)
COMPLETE X-RAYS TODAY.  COMPLETE LASIX AND ALBUMIN INFUSIONS.  REPEAT CMP ON MON FEB 16.  FOLLOW UP IN 2 MOS.

## 2015-09-27 NOTE — Progress Notes (Signed)
Quick Note:  Pt's wife [name] is ware of results and also aware to go to the lab at [facility] on Monday 10/01/2015. ______

## 2015-09-27 NOTE — Assessment & Plan Note (Addendum)
NOW C/O L SHOULDER AND FLANK PAIN IN PT WITH PANCYTOPENIA. NO HEAD INJURY.  DIFFERENTIAL DIAGNOSIS INCLUDES: L SHOULD FRACTURE AND PARTIAL SPLENIC RUPTURE OR RETROPERITONEAL BLEED  L SHOULDER FILMS AND CT ABD/PELVIS W/O IV OR ORAL CONTRAST TODAY

## 2015-09-27 NOTE — Assessment & Plan Note (Signed)
PARTIALLY COMPENSATED DISEASE.  COMPLETE X-RAYS TODAY. COMPLETE LASIX AND ALBUMIN INFUSIONS. REPEAT CMP ON MON FEB 20. FOLLOW UP IN 2 MOS.

## 2015-09-27 NOTE — Assessment & Plan Note (Signed)
NOW C/O L SHOULDER AND FLANK PAIN IN PT WITH PANCYTOPENIA. NO HEAD INJURY.  DIFFERENTIAL DIAGNOSIS INCLUDES: L SHOULD FRACTURE AND PARTIAL SPLENIC RUPTURE OR RETROPERITONEAL BLEED  L SHOULDER FILMS AND CT ABD/PELVIS W/O IV OR ORAL CONTRAST TODAY

## 2015-09-27 NOTE — Progress Notes (Signed)
Subjective:    Patient ID: [name], male    DOB: [birth year], 55 y.o.   MRN: [id number]  [name], MD  HPI Getting lasix/albumin infusions. 2ND DOSE TODAY. DOESN'T FEEL LIKE LASIX AND ALBUMIN ARE WORKING.HAVING LEFT SHOULDER PAIN. FELL 2 WEEKS ON SHOULDER. FELL ON LEFT SIDE AGO AND HIT LEFT SIDE. STAYS COLD. WEIGHT [place] D/C-316 LBS TODAY. THREW UP YESTERDAY(NO BLOOD). HAD B;LACK FORMED STOOL AFTER TAKING LACTULOSE([place]) BUT NOW SOLID. BMs: 1-4 A DAY. TAKING XIFAXAN. PAPERWORK FOR ASSISTANCE SUBMITTED.  PT DENIES FEVER, CHILLS, HEMATOCHEZIA, nausea, melena, CHEST PAIN, SHORTNESS OF BREATH,  CHANGE IN BOWEL IN HABITS, constipation, abdominal pain, problems swallowing, problems with sedation, heartburn or indigestion.   Past Medical History  Diagnosis Date  . Hypertension   . Diabetes mellitus   . GERD (gastroesophageal reflux disease)     DEC 2010 EGD/Bx REACTIVE GASTROPATHY, NO VARICES  . Hemorrhoids, internal   . BMI 40.0-44.9, adult ([place]) OCT 2010 269 LBS    APR 2012 279 LBS AUG 2014 185 LBS  . Cirrhosis ([place]) NOV 2010 CHILD PUGH A    ETOH/HCV/OBESITY  . IV drug abuse REMOTE  . Hepatitis 2010 HEP C    AST 509 ALT 267 ALK PHOS 165 ALB 3.8 NEG IGM HAV/HBSAg.   . Gallstone AUG 2012 1 CM  . GERD 10/25/2009  . COPD (chronic obstructive pulmonary disease) ([place])   . Hepatitis C     failed interferon/ribavirin. treated with ribavirin/sofosbuvir/declastasvir 2016, ?failure or early relapse unable to determine because patient was noncompliant.  . Pancytopenia ([place]) 2013  . Splenomegaly 2013  . Other pancytopenia ([place]) 02/18/2013  . Iron (Fe) deficiency anemia 02/18/2013  . Splenomegaly 02/18/2013  . Neuropathy ([place])   . Biliary dyskinesia JUL 2015    HIDA GB EF 5%  . Chronic pain   . Chronic leg pain     left  . Portal venous hypertension ([facility])   . Asthma   . Medically noncompliant 05/30/2015  . Renal insufficiency   . Cocaine abuse     Past Surgical  History  Procedure Laterality Date  . Sigmoidoscopy      2001 DR. FLEISCHMAN INTERNAL HERMORRHOIDS  . Upper gastrointestinal endoscopy  DEC 2010    BENIGN POLYPS, GASTRITIS, ?phg  . Knee surgery  RIGHT  . Hemorrhoid surgery    . Biopsy  09/08/2011    [name]:Moderate gastritis/Polyps, multiple in the body of the stomach  . Colonoscopy with propofol N/A 11/15/2013    [name]: rectal varices, small AVMs  . Esophagogastroduodenoscopy (egd) with propofol N/A 11/15/2013    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 Next EGD in April 2016.      Allergies  Allergen Reactions  . Penicillins Hives    Has patient had a PCN reaction causing immediate rash, facial/tongue/throat swelling, SOB or lightheadedness with hypotension: Yes Has patient had a PCN reaction causing severe rash involving mucus membranes or skin necrosis: Yes Has patient had a PCN reaction that required hospitalization No Has patient had a PCN reaction occurring within the last 10 years: No If all of the above answers are "NO", then may proceed with Cephalosporin use.     Current Outpatient Prescriptions  Medication Sig Dispense Refill  .      . citalopram (CELEXA) 20 MG tablet Take 1 tablet (20 mg total) by mouth daily.    Marland Kitchen FLEXERIL) 5 MG tablet Take  1 tablet (5 mg total) by mouth at bedtime.    Marland Kitchen glipiZIDE (GLUCOTROL) 10 MG tablet TAKE 1 TABLET (10 MG TOTAL) BY MOUTH DAILY.    .      . ATARAX 10 MG tablet TAKE 1 TABLET BY MOUTH Q4H PRN ITCHING.    [employer] [place] Solostar Pen Inject 10 Units into the skin at bedtime.    .      . lactulose 10 GM/15ML solution Take 15 mLs (10 g total) by mouth daily.  3X/DAY   . MAG-OX) 40 MG tablet Take 1 tablet (400 mg total) by mouth 2 (two) times daily.    Marland Kitchen PRILOSEC) 20 MG capsule Take 2 capsules (40 mg total) by mouth daily.    Marland Kitchen XIFAXAN) 550 MG TABS tablet Take 1 tablet (550 mg total) by mouth 2 (two) times daily.    .       . ALDACTONE 50 MG tablet Take 1 tablet (50 mg total) by mouth daily.    Marland Kitchen torsemide DEMADEX 20 MG tablet Take 2 tablets (40 mg total) by mouth daily.    Marland Kitchen zolpidem 10 MG tablet TAKE 1 TABLET BY MOUTH AT NIGHT AS NEEDED FOR SLEEP    . PROVENTIL HFA;VENTOLIN inhaler Inhale 2 puffs into the lungs every 6 (six) hours PRN SOB     .        Review of Systems PER HPI OTHERWISE ALL SYSTEMS ARE NEGATIVE.    Objective:   Physical Exam  Constitutional: He is oriented to person, place, and time. He appears well-developed and well-nourished. No distress.  HENT:  Head: Normocephalic and atraumatic.  Mouth/Throat: Oropharynx is clear and moist. No oropharyngeal exudate.  POSSIBLE SCLERAL ICTERUS   Eyes: Pupils are equal, round, and reactive to light. No scleral icterus.  Neck: Normal range of motion. Neck supple.  Cardiovascular: Normal rate, regular rhythm and normal heart sounds.   Pulmonary/Chest: Effort normal and breath sounds normal. No respiratory distress.  Abdominal: Soft. Bowel sounds are normal. He exhibits distension. There is tenderness (DIFFUSE x 4 QUADRANTS). There is no rebound and no guarding.  Musculoskeletal: He exhibits edema (3-4+ BIL LOWER EXTERMITIES).  Lymphadenopathy:    He has no cervical adenopathy.  Neurological: He is alert and oriented to person, place, and time.  NO  NEW FOCAL DEFICITS  Psychiatric: He has a normal mood and affect.  Vitals reviewed.     Assessment & Plan:

## 2015-09-27 NOTE — Progress Notes (Signed)
cc'ed to pcp °

## 2015-09-27 NOTE — Progress Notes (Signed)
Quick Note:  Pt's wife [name] is aware. ______

## 2015-09-28 ENCOUNTER — Encounter (HOSPITAL_COMMUNITY)
Admission: RE | Admit: 2015-09-28 | Discharge: 2015-09-28 | Disposition: A | Discharge status: Home / Self Care | Payer: Medicare Other | Source: Ambulatory Visit | Attending: Gastroenterology | Admitting: Gastroenterology

## 2015-09-28 ENCOUNTER — Encounter: Payer: Self-pay | Admitting: Family Medicine

## 2015-09-28 ENCOUNTER — Ambulatory Visit (INDEPENDENT_AMBULATORY_CARE_PROVIDER_SITE_OTHER): Payer: Medicare Other | Admitting: Family Medicine

## 2015-09-28 ENCOUNTER — Encounter (HOSPITAL_COMMUNITY): Payer: Self-pay

## 2015-09-28 VITALS — BP 114/59 | HR 75 | Temp 97.2°F | Ht 66.0 in | Wt 320.0 lb

## 2015-09-28 DIAGNOSIS — K7290 Hepatic failure, unspecified without coma: Secondary | ICD-10-CM

## 2015-09-28 DIAGNOSIS — K7460 Unspecified cirrhosis of liver: Secondary | ICD-10-CM

## 2015-09-28 DIAGNOSIS — B182 Chronic viral hepatitis C: Secondary | ICD-10-CM | POA: Diagnosis not present

## 2015-09-28 DIAGNOSIS — K7682 Hepatic encephalopathy: Secondary | ICD-10-CM

## 2015-09-28 MED ORDER — FUROSEMIDE 10 MG/ML IJ SOLN
INTRAMUSCULAR | Status: AC
  Filled 2015-09-28: qty 4

## 2015-09-28 MED ORDER — SODIUM CHLORIDE 0.9 % IV SOLN
Freq: Once | INTRAVENOUS | Status: AC
  Administered 2015-09-28: 250 mL via INTRAVENOUS

## 2015-09-28 MED ORDER — ALBUMIN HUMAN 25 % IV SOLN
25.0000 g | Freq: Once | INTRAVENOUS | Status: AC
  Administered 2015-09-28: 25 g via INTRAVENOUS
  Filled 2015-09-28: qty 100

## 2015-09-28 MED ORDER — FUROSEMIDE 10 MG/ML IJ SOLN
40.0000 mg | Freq: Once | INTRAMUSCULAR | Status: AC
  Administered 2015-09-28: 40 mg via INTRAVENOUS

## 2015-09-28 MED ORDER — TORSEMIDE 20 MG PO TABS: 40.0000 mg | ORAL_TABLET | Freq: Every day | ORAL | Status: AC

## 2015-09-28 MED ORDER — OXYCODONE HCL 10 MG PO TABS: 10.0000 mg | ORAL_TABLET | Freq: Four times a day (QID) | ORAL | Status: DC | PRN

## 2015-09-28 MED ORDER — LACTULOSE 10 GM/15ML PO SOLN: 10.0000 g | Freq: Every day | ORAL | Status: DC

## 2015-09-28 MED ORDER — CYCLOBENZAPRINE HCL 5 MG PO TABS: 5.0000 mg | ORAL_TABLET | Freq: Every day | ORAL | Status: AC

## 2015-09-28 MED ORDER — ZOLPIDEM TARTRATE 10 MG PO TABS: ORAL_TABLET | ORAL | Status: AC
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
## 2015-09-28 NOTE — Progress Notes (Signed)
Patient weighing 319.6 today with dyspnea upon exertion. Pt. States that he feels"tight". [name] informed of pts complaints. Patient informed to check in next week in radiology for possible abdominal tap. Referring to xray this week that resulted with little fluid noted. Patient is also informed to go to ER if symptoms worsen.

## 2015-10-01 ENCOUNTER — Other Ambulatory Visit: Payer: Self-pay | Admitting: *Deleted

## 2015-10-01 NOTE — Patient Outreach (Signed)
10/01/15- Telephone call to pt for transition of care week 3, spoke with wife [name] who reports home health continues seeing pt and will be out either today or tomorrow to see pt , reports "he didn't take his medicine (lactulose) yesterday and not sure if he took it today, he says he doesn't like it that it makes him go to the bathroom"   Weight today 313 pounds, pt did have 3 days of IV lasix with albumin and wife says "it didn't really help, nothing helps him"  Pt saw primary MD Friday and wife states "fluid pill increased to 3 per day" , pt continues with fluid restrictions, wife reports pt may have paracentesis this week through [name] order.  Wife states " this is just getting harder and harder to manage but I don't want him to go to a nursing home"  RN CM discussed hospice with wife and she feels this may be a good time to have hospice come to their home to discuss their program and then she and patient can decide. RN CM called primary Dr. Loreta Castillo and spoke with [name], reported pt weighs 313 pounds today, per [name], pt weighed 320 in their office on Friday, reported pt has not been taking lactulose as prescribed, [name] will let MD know, RN CM discussed option of hospice, [name] will let MD know and get request to [facility]. RN CM called Cory Poot RN with [facility], reported the above information and referral for hospice being completed, [name] states she will see pt tomorrow for home visit.  THN CM Care Plan Problem One        Most Recent Value   Care Plan Problem One  Pt high risk for hospitalization related to disease process   Role Documenting the Problem One  Care Management Coordinator   Care Plan for Problem One  Active   THN Long Term Goal (31-90 days)  pt will have no hospitalizations within 45 days.   THN Long Term Goal Start Date  09/18/15   Interventions for Problem One Long Term Goal  RN CM reviewed action plan and importance of taking  lactulose as prescribed, reviewed fluid restrictions, RN CM called [name] office, made request for hospice.   THN CM Short Term Goal #2 Start Date  -- [goal restarted]   THN CM Short Term Goal #2 Met Date  -- [remove from careplan]    [facility] CM Care Plan Problem Two        Most Recent Value   Care Plan Problem Two  Knowledge deficit related to medication regimen   Role Documenting the Problem Two  Care Management Coordinator   Care Plan for Problem Two  Active   THN CM Short Term Goal #1 (0-30 days)  pt will have all medications on hand within 30 days   THN CM Short Term Goal #1 Start Date  09/18/15   Interventions for Short Term Goal #2   RN CM reviewed importance of taking medications as prescribed and importance of lactulose to decrease ammonia levels, pt reports he has all medications on hand.      [name] [facility], [place] Coordinator [phone]

## 2015-10-03 ENCOUNTER — Telehealth: Payer: Self-pay | Admitting: Family Medicine

## 2015-10-03 ENCOUNTER — Ambulatory Visit (HOSPITAL_COMMUNITY): Admission: RE | Admit: 2015-10-03 | Payer: Medicare Other | Source: Ambulatory Visit

## 2015-10-03 NOTE — Telephone Encounter (Signed)
lmtcb

## 2015-10-05 ENCOUNTER — Other Ambulatory Visit: Payer: Self-pay | Admitting: Family Medicine

## 2015-10-05 MED ORDER — LACTULOSE 10 GM/15ML PO SOLN: 10.0000 g | Freq: Every day | ORAL | Status: DC

## 2015-10-05 NOTE — Telephone Encounter (Signed)
done

## 2015-10-08 ENCOUNTER — Inpatient Hospital Stay (HOSPITAL_COMMUNITY)
Admission: EM | Admit: 2015-10-08 | Discharge: 2015-10-10 | DRG: 442 | Disposition: A | Discharge status: Hospice | Payer: Medicare Other | Attending: Internal Medicine | Admitting: Internal Medicine

## 2015-10-08 ENCOUNTER — Encounter (HOSPITAL_COMMUNITY): Payer: Self-pay | Admitting: *Deleted

## 2015-10-08 ENCOUNTER — Emergency Department (HOSPITAL_COMMUNITY): Payer: Medicare Other

## 2015-10-08 ENCOUNTER — Inpatient Hospital Stay (HOSPITAL_COMMUNITY): Payer: Medicare Other

## 2015-10-08 DIAGNOSIS — K7290 Hepatic failure, unspecified without coma: Principal | ICD-10-CM | POA: Diagnosis present

## 2015-10-08 DIAGNOSIS — R627 Adult failure to thrive: Secondary | ICD-10-CM | POA: Diagnosis present

## 2015-10-08 DIAGNOSIS — J449 Chronic obstructive pulmonary disease, unspecified: Secondary | ICD-10-CM | POA: Diagnosis present

## 2015-10-08 DIAGNOSIS — K7460 Unspecified cirrhosis of liver: Secondary | ICD-10-CM | POA: Diagnosis present

## 2015-10-08 DIAGNOSIS — K219 Gastro-esophageal reflux disease without esophagitis: Secondary | ICD-10-CM | POA: Diagnosis present

## 2015-10-08 DIAGNOSIS — Z79899 Other long term (current) drug therapy: Secondary | ICD-10-CM

## 2015-10-08 DIAGNOSIS — D696 Thrombocytopenia, unspecified: Secondary | ICD-10-CM | POA: Diagnosis present

## 2015-10-08 DIAGNOSIS — Z66 Do not resuscitate: Secondary | ICD-10-CM | POA: Diagnosis present

## 2015-10-08 DIAGNOSIS — N183 Chronic kidney disease, stage 3 (moderate): Secondary | ICD-10-CM | POA: Diagnosis present

## 2015-10-08 DIAGNOSIS — Z794 Long term (current) use of insulin: Secondary | ICD-10-CM

## 2015-10-08 DIAGNOSIS — K766 Portal hypertension: Secondary | ICD-10-CM | POA: Diagnosis present

## 2015-10-08 DIAGNOSIS — G8929 Other chronic pain: Secondary | ICD-10-CM | POA: Diagnosis present

## 2015-10-08 DIAGNOSIS — K7682 Hepatic encephalopathy: Secondary | ICD-10-CM | POA: Diagnosis present

## 2015-10-08 DIAGNOSIS — Z7189 Other specified counseling: Secondary | ICD-10-CM | POA: Insufficient documentation

## 2015-10-08 DIAGNOSIS — J45909 Unspecified asthma, uncomplicated: Secondary | ICD-10-CM | POA: Diagnosis present

## 2015-10-08 DIAGNOSIS — B182 Chronic viral hepatitis C: Secondary | ICD-10-CM | POA: Diagnosis present

## 2015-10-08 DIAGNOSIS — Z515 Encounter for palliative care: Secondary | ICD-10-CM

## 2015-10-08 DIAGNOSIS — D6959 Other secondary thrombocytopenia: Secondary | ICD-10-CM | POA: Diagnosis present

## 2015-10-08 DIAGNOSIS — I129 Hypertensive chronic kidney disease with stage 1 through stage 4 chronic kidney disease, or unspecified chronic kidney disease: Secondary | ICD-10-CM | POA: Diagnosis present

## 2015-10-08 DIAGNOSIS — R4182 Altered mental status, unspecified: Secondary | ICD-10-CM | POA: Diagnosis present

## 2015-10-08 DIAGNOSIS — E1122 Type 2 diabetes mellitus with diabetic chronic kidney disease: Secondary | ICD-10-CM | POA: Diagnosis present

## 2015-10-08 DIAGNOSIS — R188 Other ascites: Secondary | ICD-10-CM | POA: Diagnosis present

## 2015-10-08 DIAGNOSIS — Z6841 Body Mass Index (BMI) 40.0 and over, adult: Secondary | ICD-10-CM

## 2015-10-08 DIAGNOSIS — K7210 Chronic hepatic failure without coma: Secondary | ICD-10-CM

## 2015-10-08 DIAGNOSIS — Z79891 Long term (current) use of opiate analgesic: Secondary | ICD-10-CM

## 2015-10-08 LAB — CBC
HEMATOCRIT: 26.1 % — AB (ref 39.0–52.0)
Hemoglobin: 8.8 g/dL — ABNORMAL LOW (ref 13.0–17.0)
MCH: 32.4 pg (ref 26.0–34.0)
MCHC: 33.7 g/dL (ref 30.0–36.0)
MCV: 96 fL (ref 78.0–100.0)
Platelets: 60 10*3/uL — ABNORMAL LOW (ref 150–400)
RBC: 2.72 MIL/uL — ABNORMAL LOW (ref 4.22–5.81)
RDW: 14.9 % (ref 11.5–15.5)
WBC: 4.5 10*3/uL (ref 4.0–10.5)

## 2015-10-08 LAB — CBG MONITORING, ED
Glucose-Capillary: 132 mg/dL — ABNORMAL HIGH (ref 65–99)
Glucose-Capillary: 126 mg/dL — ABNORMAL HIGH (ref 65–99)

## 2015-10-08 LAB — BASIC METABOLIC PANEL
Anion gap: 9 (ref 5–15)
BUN: 49 mg/dL — AB (ref 6–20)
CHLORIDE: 103 mmol/L (ref 101–111)
CO2: 22 mmol/L (ref 22–32)
Calcium: 8.4 mg/dL — ABNORMAL LOW (ref 8.9–10.3)
Creatinine, Ser: 1.95 mg/dL — ABNORMAL HIGH (ref 0.61–1.24)
GFR calc Af Amer: 43 mL/min — ABNORMAL LOW (ref >60)
GFR calc non Af Amer: 37 mL/min — ABNORMAL LOW (ref >60)
GLUCOSE: 165 mg/dL — AB (ref 65–99)
POTASSIUM: 4.4 mmol/L (ref 3.5–5.1)
Sodium: 134 mmol/L — ABNORMAL LOW (ref 135–145)

## 2015-10-08 LAB — AMMONIA: Ammonia: 120 umol/L — ABNORMAL HIGH (ref 9–35)

## 2015-10-08 MED ORDER — MORPHINE SULFATE (CONCENTRATE) 10 MG/0.5ML PO SOLN: 10.0000 mg | ORAL | Status: DC | PRN

## 2015-10-08 MED ORDER — LORAZEPAM 2 MG/ML PO CONC: 1.0000 mg | ORAL | Status: DC | PRN

## 2015-10-08 MED ORDER — SODIUM CHLORIDE 0.9 % IV BOLUS (SEPSIS)
1000.0000 mL | Freq: Once | INTRAVENOUS | Status: AC
  Administered 2015-10-08: 1000 mL via INTRAVENOUS

## 2015-10-08 MED ORDER — HYDROMORPHONE HCL 1 MG/ML IJ SOLN
1.0000 mg | INTRAMUSCULAR | Status: DC | PRN
  Administered 2015-10-09: 2 mg via INTRAVENOUS
  Filled 2015-10-08: qty 2

## 2015-10-08 MED ORDER — LORAZEPAM 2 MG/ML IJ SOLN
1.0000 mg | INTRAMUSCULAR | Status: DC | PRN
  Administered 2015-10-09: 1 mg via INTRAVENOUS
  Filled 2015-10-08: qty 1

## 2015-10-08 NOTE — ED Notes (Addendum)
In and out catheterization attempted. Could not get catheter to advance enough to get urine.  Dr. Dayna Barker informed. Instructed to hold any further I&O attempts.
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
## 2015-10-09 ENCOUNTER — Encounter: Payer: Self-pay | Admitting: *Deleted

## 2015-10-09 ENCOUNTER — Inpatient Hospital Stay (HOSPITAL_COMMUNITY): Payer: Medicare Other

## 2015-10-09 ENCOUNTER — Ambulatory Visit: Payer: Self-pay | Admitting: *Deleted

## 2015-10-09 ENCOUNTER — Encounter (HOSPITAL_COMMUNITY): Payer: Self-pay | Admitting: *Deleted

## 2015-10-09 LAB — PROTIME-INR
INR: 1.41 (ref 0.00–1.49)
Prothrombin Time: 17.4 s — ABNORMAL HIGH (ref 11.6–15.2)

## 2015-10-09 LAB — GLUCOSE, CAPILLARY
Glucose-Capillary: 200 mg/dL — ABNORMAL HIGH (ref 65–99)
Glucose-Capillary: 118 mg/dL — ABNORMAL HIGH (ref 65–99)

## 2015-10-09 LAB — ALBUMIN: Albumin: 2.5 g/dL — ABNORMAL LOW (ref 3.5–5.0)

## 2015-10-09 MED ORDER — LACTULOSE 10 GM/15ML PO SOLN
20.0000 g | Freq: Three times a day (TID) | ORAL | Status: DC
  Administered 2015-10-09 – 2015-10-10 (×4): 20 g via ORAL
  Filled 2015-10-09 (×4): qty 30

## 2015-10-09 MED ORDER — ACETAMINOPHEN 325 MG PO TABS: 650.0000 mg | ORAL_TABLET | Freq: Four times a day (QID) | ORAL | Status: DC | PRN

## 2015-10-09 MED ORDER — ONDANSETRON HCL 4 MG/2ML IJ SOLN
4.0000 mg | Freq: Four times a day (QID) | INTRAMUSCULAR | Status: DC | PRN
  Administered 2015-10-09: 4 mg via INTRAVENOUS
  Filled 2015-10-09: qty 2

## 2015-10-09 MED ORDER — INSULIN ASPART 100 UNIT/ML ~~LOC~~ SOLN
0.0000 [IU] | Freq: Three times a day (TID) | SUBCUTANEOUS | Status: DC
  Administered 2015-10-09 – 2015-10-10 (×3): 3 [IU] via SUBCUTANEOUS

## 2015-10-09 MED ORDER — INSULIN ASPART 100 UNIT/ML ~~LOC~~ SOLN: 0.0000 [IU] | Freq: Every day | SUBCUTANEOUS | Status: DC

## 2015-10-09 MED ORDER — SODIUM CHLORIDE 0.9% FLUSH
3.0000 mL | Freq: Two times a day (BID) | INTRAVENOUS | Status: DC
  Administered 2015-10-09 (×2): 3 mL via INTRAVENOUS

## 2015-10-09 MED ORDER — SODIUM CHLORIDE 0.9% FLUSH: 3.0000 mL | INTRAVENOUS | Status: DC | PRN

## 2015-10-09 MED ORDER — ONDANSETRON HCL 4 MG PO TABS: 4.0000 mg | ORAL_TABLET | Freq: Four times a day (QID) | ORAL | Status: DC | PRN

## 2015-10-09 MED ORDER — BISACODYL 10 MG RE SUPP: 10.0000 mg | Freq: Every day | RECTAL | Status: DC | PRN

## 2015-10-09 MED ORDER — POLYETHYLENE GLYCOL 3350 17 G PO PACK: 17.0000 g | PACK | Freq: Every day | ORAL | Status: DC | PRN

## 2015-10-09 MED ORDER — ALUM & MAG HYDROXIDE-SIMETH 200-200-20 MG/5ML PO SUSP: 30.0000 mL | Freq: Four times a day (QID) | ORAL | Status: DC | PRN

## 2015-10-09 MED ORDER — ACETAMINOPHEN 650 MG RE SUPP: 650.0000 mg | Freq: Four times a day (QID) | RECTAL | Status: DC | PRN

## 2015-10-09 MED ORDER — SPIRONOLACTONE 25 MG PO TABS
50.0000 mg | ORAL_TABLET | Freq: Every day | ORAL | Status: DC
  Administered 2015-10-09 – 2015-10-10 (×2): 50 mg via ORAL
  Filled 2015-10-09 (×2): qty 2

## 2015-10-09 MED ORDER — SODIUM CHLORIDE 0.9 % IV SOLN: 250.0000 mL | INTRAVENOUS | Status: DC | PRN

## 2015-10-09 MED ORDER — TORSEMIDE 20 MG PO TABS
40.0000 mg | ORAL_TABLET | Freq: Every day | ORAL | Status: DC
  Administered 2015-10-09 – 2015-10-10 (×2): 40 mg via ORAL
  Filled 2015-10-09 (×2): qty 2

## 2015-10-09 NOTE — Progress Notes (Signed)
Present with patient for support. He asked for help making a decision about Hospice care at his home. He seems to be trying to come to terms with his illness and its progression, He was tearful as he talked about "making peace with his family." He eventually shared he felt he was dying but he had several people he needed to "make amends with."We talked about this issues at length and to the point of being at peace with himself. We discussed life history also and decided he wanted to have some of these discussions written with the intent of sharing his thoughts with his loved ones. We moved to ideas of his legacy. Will bring a journal and begin this process of documenting some of what is meaningful for him to be shared with family.  Prayed with him also.

## 2015-10-09 NOTE — Care Management (Signed)
Patient information faxed to [facility].  [name].
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
## 2015-10-09 NOTE — Progress Notes (Signed)
Returned to room, per transporter not enough fluid to remove.

## 2015-10-09 NOTE — Progress Notes (Signed)
Nutrition Brief Note  Patient identified on the Malnutrition Screening Tool (MST) Report  Wt Readings from Last 15 Encounters:  10/09/15 309 lb 8 oz (140.388 kg)  09/28/15 320 lb (145.151 kg)  09/28/15 319 lb 9.6 oz (144.97 kg)  09/27/15 316 lb (143.337 kg)  09/27/15 316 lb 12.8 oz (143.7 kg)  09/18/15 307 lb (139.254 kg)  09/17/15 321 lb 4.8 oz (145.741 kg)  08/31/15 300 lb (136.079 kg)  08/29/15 307 lb 15.7 oz (139.7 kg)  07/27/15 286 lb (129.729 kg)  07/24/15 288 lb 4 oz (130.749 kg)  07/17/15 288 lb 12.8 oz (131 kg)  06/15/15 331 lb 12.8 oz (150.503 kg)  05/31/15 320 lb (145.151 kg)  05/31/15 319 lb (144.697 kg)    Body mass index is 49.98 kg/(m^2). Patient meets criteria for Morbidly obese based on current BMI.   Per MD notes, pt has had progressive FTT/decreased QOL. Pt has elected to go home with hospice. Plan is to stabilize him for home hospice. Hospice to see pt today and talk about next steps. Now comfort care  No nutrition interventions warranted at this time. If nutrition issues arise, please consult RD.   Burtis Junes RD, LDN Nutrition Pager: [phone] 10/09/2015 9:43 AM

## 2015-10-09 NOTE — Progress Notes (Signed)
Down via wheelchair for US Paracentesis.  New meds ordered and will be given on return from procedure.

## 2015-10-09 NOTE — Progress Notes (Signed)
[place] and patient's brother was present. Mr Catapano wanted to talk with his brother about these issues. I plan to check with him tomorrow.

## 2015-10-09 NOTE — Progress Notes (Signed)
[facility] PROGRESS NOTE  [name] [id number] DOB: 1961-04-22 DOA: 10/08/2015 PCP: [name], MD Summary  39 yom with PMH of ESLD and chronic hepatitis C with cirrhosis presented with complaints of altered mental status and chronic pain. The patient is deciding to pursue hospice care services and has been admitted for further symptom management. He will undergo paracentesis for symptomatic relief of ascites. He will be restarted on lactulose and diuretics. PMT consult for goals of care.   Assessment/Plan: 1. End stage liver diease, due to chronic hep C. Pt has had frequent admissions for liver issues including hepatic ephalopathy . He is currently considering hospice services. Will request PMT to help assist discussions.  2. Hepatic encephalopathy. Ammonia elevated at 120. Will start the patient on IV lactulose.  3. Chronic hepatitis C with cirrhosis. Pt has significant ascites that is causing SOB. Will request US guided paracentesis  for therapeutic relief.  4. Anasarca, will restart patient on Tosemide and spironlactone.  5. CKD Stage III. Creatinine is at baseline.  6. Thrombocytopenia, chronic. Related to live disease.  7. Discussion, PMT consulted to address goals of care to see if the patient is appropriate for hospice.   Code Status: DNR DVT prophylaxis: SCDs Family Communication: Brother bedside Disposition Plan: Discharge with home hospice in 24 hours  Consultants:  None  Procedures:  none  Antibiotics:  none   HPI/Subjective: Feels the same. Fluid build up causing SOB. Confused and dizzy since he has not taken medications.   Objective: Filed Vitals:   10/09/15 0010 10/09/15 0528  BP: 151/55 168/73  Pulse: 86 95  Temp: 99 F (37.2 C) 97.9 F (36.6 C)  Resp: 18 18    Intake/Output Summary (Last 24 hours) at 10/09/15 1413 Last data filed at 10/09/15 1000  Gross per 24 hour  Intake      3 ml  Output      0 ml  Net      3 ml   Filed  Weights   10/08/15 1734 10/09/15 0010  Weight: 145.151 kg (320 lb) 140.388 kg (309 lb 8 oz)    Exam: General: NAD, looks comfortable Cardiovascular: RRR, S1, S2  Respiratory: clear bilaterally, No wheezing, rales or rhonchi Abdomen:  Distention which is diffusely tender.  bowel sounds normal Musculoskeletal: 2+ edema b/l  Data Reviewed: Basic Metabolic Panel:  Recent Labs Lab 10/08/15 1802  NA 134*  K 4.4  CL 103  CO2 22  GLUCOSE 165*  BUN 49*  CREATININE 1.95*  CALCIUM 8.4*   Liver Function Tests:  Recent Labs Lab 10/09/15 1244  ALBUMIN 2.5*    Recent Labs Lab 10/08/15 1931  AMMONIA 120*   CBC:  Recent Labs Lab 10/08/15 1802  WBC 4.5  HGB 8.8*  HCT 26.1*  MCV 96.0  PLT 60*   BNP (last 3 results)  Recent Labs  06/11/15 1546  BNP 89.0    CBG:  Recent Labs Lab 10/08/15 1736 10/08/15 2114  GLUCAP 132* 126*     Studies: No results found.  Scheduled Meds: . sodium chloride flush  3 mL Intravenous Q12H   Continuous Infusions:   Principal Problem:   End stage liver disease (HCC) Active Problems:   Chronic hepatitis C with cirrhosis ([place])   Hepatic encephalopathy ([place])   Hospice care patient    Time spent: 25 minutes     Kathie Dike, MD  [facility] Pager [phone]. If 7PM-7AM, please contact night-coverage at [email or web address], password [facility] 10/09/2015, 2:13  PM  LOS: 1 day     By signing my name below, I, Rennis Harding, attest that this documentation has been prepared under the direction and in the presence of Kathie Dike, MD. Electronically signed: Rennis Harding, Scribe. 10/09/2015 2:15pm   I, Dr. Kathie Dike, personally performed the services described in this documentaiton. All medical record entries made by the scribe were at my direction and in my presence. I have reviewed the chart and agree that the record reflects my personal performance and is accurate and complete  Kathie Dike, MD, 10/09/2015  2:35 PM

## 2015-10-09 NOTE — Consult Note (Signed)
   [facility] CM Inpatient Consult   10/09/2015  [name] July 02, 1961 [id number]  Patient is currently active with [facility] for chronic disease management services.  Patient has been engaged by a [employer].  Spoke with inpatient case manager, patient is being evaluated by Hospice for possible Hospice care at discharge. Will follow progress and make community [place] Coordinator aware of discharge disposition. Of note, [facility] Care Management services does not replace or interfere with any services that are needed or arranged by inpatient case management or social work.   For additional questions or referrals please contact: Royetta Crochet. [name], RN, BSN, [facility] Liaison [phone]

## 2015-10-10 DIAGNOSIS — D696 Thrombocytopenia, unspecified: Secondary | ICD-10-CM

## 2015-10-10 DIAGNOSIS — Z515 Encounter for palliative care: Secondary | ICD-10-CM

## 2015-10-10 DIAGNOSIS — K7290 Hepatic failure, unspecified without coma: Principal | ICD-10-CM

## 2015-10-10 DIAGNOSIS — Z7189 Other specified counseling: Secondary | ICD-10-CM

## 2015-10-10 DIAGNOSIS — R188 Other ascites: Secondary | ICD-10-CM

## 2015-10-10 DIAGNOSIS — B182 Chronic viral hepatitis C: Secondary | ICD-10-CM

## 2015-10-10 DIAGNOSIS — K7460 Unspecified cirrhosis of liver: Secondary | ICD-10-CM

## 2015-10-10 LAB — COMPREHENSIVE METABOLIC PANEL
ALBUMIN: 2.9 g/dL — AB (ref 3.5–5.0)
ALK PHOS: 93 U/L (ref 38–126)
ALT: 39 U/L (ref 17–63)
ANION GAP: 9 (ref 5–15)
AST: 94 U/L — ABNORMAL HIGH (ref 15–41)
BUN: 48 mg/dL — ABNORMAL HIGH (ref 6–20)
CALCIUM: 8.4 mg/dL — AB (ref 8.9–10.3)
CO2: 23 mmol/L (ref 22–32)
Chloride: 105 mmol/L (ref 101–111)
Creatinine, Ser: 2.16 mg/dL — ABNORMAL HIGH (ref 0.61–1.24)
GFR calc non Af Amer: 33 mL/min — ABNORMAL LOW (ref >60)
GFR, EST AFRICAN AMERICAN: 38 mL/min — AB (ref >60)
GLUCOSE: 187 mg/dL — AB (ref 65–99)
POTASSIUM: 4.5 mmol/L (ref 3.5–5.1)
SODIUM: 137 mmol/L (ref 135–145)
TOTAL PROTEIN: 6.6 g/dL (ref 6.5–8.1)
Total Bilirubin: 2.9 mg/dL — ABNORMAL HIGH (ref 0.3–1.2)

## 2015-10-10 LAB — GLUCOSE, CAPILLARY
GLUCOSE-CAPILLARY: 168 mg/dL — AB (ref 65–99)
Glucose-Capillary: 191 mg/dL — ABNORMAL HIGH (ref 65–99)

## 2015-10-10 LAB — CBC
HCT: 25.9 % — ABNORMAL LOW (ref 39.0–52.0)
HEMOGLOBIN: 8.7 g/dL — AB (ref 13.0–17.0)
MCH: 32.5 pg (ref 26.0–34.0)
MCHC: 33.6 g/dL (ref 30.0–36.0)
MCV: 96.6 fL (ref 78.0–100.0)
Platelets: 59 10*3/uL — ABNORMAL LOW (ref 150–400)
RBC: 2.68 MIL/uL — ABNORMAL LOW (ref 4.22–5.81)
RDW: 15.2 % (ref 11.5–15.5)
WBC: 8.5 10*3/uL (ref 4.0–10.5)

## 2015-10-10 LAB — AMMONIA: Ammonia: 108 umol/L — ABNORMAL HIGH (ref 9–35)

## 2015-10-10 MED ORDER — GLIPIZIDE 10 MG PO TABS: ORAL_TABLET | ORAL | Status: DC

## 2015-10-10 MED ORDER — LACTULOSE 10 GM/15ML PO SOLN: 15.0000 g | Freq: Every day | ORAL | Status: AC

## 2015-10-10 NOTE — Discharge Summary (Signed)
Physician Discharge Summary  [name] Luscher [id number] DOB: 17-Dec-1960 DOA: 10/08/2015  PCP: [name], MD  Admit date: 10/08/2015 Discharge date: 10/10/2015  Time spent: Greater than 30 minutes  Recommendations for Outpatient Follow-up:  1. The patient was discharged to home with hospice ordered.     Discharge Diagnoses:  1. Transition to hospice care. 2. Hepatic encephalopathy. 3. End-stage liver disease with chronic hepatitis C cirrhosis. 4. Mild ascites. 5. Thrombocytopenia secondary to cirrhosis. 6. Insulin-dependent diabetes mellitus. 7. Stage III chronic kidney disease. 8. Morbid obesity.   Discharge Condition: Stable, but terminal.  Diet recommendation: Heart healthy.  Filed Weights   10/08/15 1734 10/09/15 0010  Weight: 145.151 kg (320 lb) 140.388 kg (309 lb 8 oz)    History of present illness:  The patient is a 55 year old man with a history of insulin requiring diabetes mellitus, end-stage cirrhosis secondary to hepatitis C and history of alcohol abuse, who presented to the ED on 10/08/15 with altered mental status. He was apparently turned down for liver transplant at either [facility] several years ago. He had been taking Xifaxan mean and lactulose, but apparently he forgot to take 1 dose. His wife was having a hard time waking him up. In the ED, he was afebrile and hemodynamically stable. His ammonia level was 120. He was admitted for further evaluation and management.  Hospital Course:  The patient was restarted on his chronic medications. However, the dose of lactulose was doubled during the hospitalization. A long discussion was held with the patient and his wife by admitting physician, Dr. Jonnie Finner regarding the patient's poor prognosis and recommendation for palliative care or hospice. They were receptive. PMT, Ms. Hulan Fray was consulted. She establish goals of care and discuss hospice evaluation with the patient and his wife. She provided recommendations  for pain control and psychosocial/spiritual support. Palliative ultrasound guided paracentesis was ordered for what was thought to be a large amount of ascites. However, the ultrasound revealed minimal ascites, so the paracentesis was canceled.  The patient's encephalopathy resolved. All of his chronic medical conditions were stable and relatively controlled. His ammonia level did decrease, but it did not normalize. He was discharged on his same chronic medications with exception of a small increase in his home dosing of lactulose. He will be followed by hospice who will manage his pain and discomfort.  Procedures:  None  Consultations:  Palliative care  Discharge Exam: Filed Vitals:   10/10/15 0556 10/10/15 1319  BP: 136/94 167/65  Pulse: 105 86  Temp: 98.2 F (36.8 C) 97.8 F (36.6 C)  Resp: 20 20   oxygen saturation 100%.  General: Alert obese 55 year old man in no acute distress. Cardiovascular: S1, S2, no murmurs rubs or gallops. Respiratory: Clear anteriorly with decreased breath sounds in the bases. Abdomen: Obese, positive bowel sounds, soft, nontender, nondistended. Extremities: Trace pedal edema bilaterally. Neurologic: He is alert and oriented 3.  Discharge Instructions   Discharge Instructions    Diet - low sodium heart healthy    Complete by:  As directed      Discharge instructions    Complete by:  As directed   SYMPTOM AND PAIN MANAGEMENT PER HOSPICE PROTOCOL.     Increase activity slowly    Complete by:  As directed           Current Discharge Medication List    CONTINUE these medications which have CHANGED   Details  glipiZIDE (GLUCOTROL) 10 MG tablet DO NOT GIVE THIS MEDICATION IF HIS  BLOOD SUGAR IS LESS THAN 120. TAKE 1 TABLET (10 MG TOTAL) BY MOUTH DAILY.    lactulose (CHRONULAC) 10 GM/15ML solution Take 22.5 mLs (15 g total) by mouth daily. TAKE 15 MLS (10 G TOTAL) BY MOUTH 3 (THREE) TIMES DAILY. Qty: 473 mL, Refills: 2      CONTINUE these  medications which have NOT CHANGED   Details  albuterol (PROVENTIL HFA;VENTOLIN HFA) 108 (90 Base) MCG/ACT inhaler Inhale 2 puffs into the lungs every 6 (six) hours as needed. Shortness of breath Qty: 1 Inhaler, Refills: 1    citalopram (CELEXA) 20 MG tablet Take 1 tablet (20 mg total) by mouth daily. Qty: 90 tablet, Refills: 0    cyclobenzaprine (FLEXERIL) 5 MG tablet Take 1 tablet (5 mg total) by mouth at bedtime. Qty: 30 tablet, Refills: 1    hydrOXYzine (ATARAX/VISTARIL) 10 MG tablet TAKE 1 TABLET (10 MG TOTAL) BY MOUTH EVERY 4 (FOUR) HOURS AS NEEDED FOR ITCHING. Qty: 30 tablet, Refills: 1    Insulin Glargine (LANTUS SOLOSTAR) 100 UNIT/ML Solostar Pen Inject 10 Units into the skin at bedtime.    magnesium oxide (MAG-OX) 400 (241.3 MG) MG tablet Take 1 tablet (400 mg total) by mouth 2 (two) times daily. Qty: 60 tablet, Refills: 1    omeprazole (PRILOSEC) 20 MG capsule Take 2 capsules (40 mg total) by mouth daily. Qty: 180 capsule, Refills: 1    Oxycodone HCl 10 MG TABS Take 1 tablet (10 mg total) by mouth every 6 (six) hours as needed. Qty: 120 tablet, Refills: 0    rifaximin (XIFAXAN) 550 MG TABS tablet Take 1 tablet (550 mg total) by mouth 2 (two) times daily. Qty: 60 tablet, Refills: 5    silver sulfADIAZINE (SILVADENE) 1 % cream Apply 1 application topically daily. Apply to burn area daily. Qty: 50 g, Refills: 0    spironolactone (ALDACTONE) 50 MG tablet Take 1 tablet (50 mg total) by mouth daily. Qty: 30 tablet, Refills: 3    torsemide (DEMADEX) 20 MG tablet Take 2 tablets (40 mg total) by mouth daily. Qty: 60 tablet, Refills: 2    zolpidem (AMBIEN) 10 MG tablet TAKE 1 TABLET BY MOUTH AT NIGHT AS NEEDED FOR SLEEP Qty: 15 tablet, Refills: 1    glucose blood test strip Use as instructed Qty: 100 each, Refills: 12    Insulin Pen Needle (BD PEN NEEDLE NANO U/F) 32G X 4 MM MISC Inject 1 application into the skin at bedtime. Qty: 100 each, Refills: 11       Allergies   Allergen Reactions  . Penicillins Hives    Has patient had a PCN reaction causing immediate rash, facial/tongue/throat swelling, SOB or lightheadedness with hypotension: Yes Has patient had a PCN reaction causing severe rash involving mucus membranes or skin necrosis: Yes Has patient had a PCN reaction that required hospitalization No Has patient had a PCN reaction occurring within the last 10 years: No If all of the above answers are "NO", then may proceed with Cephalosporin use.    Follow-up Information    Schedule an appointment as soon as possible for a visit with MILLER, Lillette Boxer, MD.   Specialty:  Family Medicine   Why:  TO FOLLOW-UP IN 2-3 WEEKS AS NEEDED.   Contact information:   [address] [place] [address] [phone]        The results of significant diagnostics from this hospitalization (including imaging, microbiology, ancillary and laboratory) are listed below for reference.    Significant Diagnostic  Studies: Ct Abdomen Pelvis Wo Contrast  09/27/2015  CLINICAL DATA:  ALL OVER ABD PAIN WORSE LUQ, SWELLING X 1 WEEKS, HX CIRRHOSIS, PARACENTESIS IN PAST. HX HTN, DM ORDERED W/O IV OR ORAL CONTRAST. EXAM: CT ABDOMEN AND PELVIS WITHOUT CONTRAST TECHNIQUE: Multidetector CT imaging of the abdomen and pelvis was performed following the standard protocol without IV contrast. COMPARISON:  06/23/2015 FINDINGS: Lower chest: Focus of peripheral cystic change in the RIGHT middle lobe is stable. No pleural fluid. Hepatobiliary: Non IV contrast images demonstrated shrunken nodular liver. Moderate volume ascites surrounds RIGHT hepatic lobe. Gallstones within a decompressed gallbladder. Pancreas: Pancreas is normal. No ductal dilatation. No pancreatic inflammation. Spleen: Spleen is markedly enlarged similar to comparison exam. The spleen measures 14.3 cm in axial dimension compared to 14.6 cm on prior for no change. There is extensive venous collaterals extending from the splenic hilum  along the gonadal vein and into the LEFT inguinal canal. Adrenals/urinary tract: Adrenal glands and kidneys are normal. The ureters and bladder normal. Stomach/Bowel: Stomach, small bowel, appendix, and cecum are normal. The colon and rectosigmoid colon are normal. Vascular/Lymphatic: Abdominal aorta is normal caliber. Again multiple venous collaterals extending from the splenic hilum into the LEFT inguinal canal. Reproductive: Prostate small. Other: Moderate volume free fluid surrounding the liver and spleen and collecting in the pelvis is increased from comparison exam. Musculoskeletal: No aggressive osseous lesion. IMPRESSION: 1. Shrunken nodular liver consistent with cirrhosis. 2. Moderate volume ascites is increased from comparison exam. 3. Splenomegaly with venous collaterals extend along the LEFT gonadal vein into the inguinal canal. Electronically Signed   By: Suzy Bouchard M.D.   On: 09/27/2015 11:18   Ct Head Wo Contrast  09/14/2015  CLINICAL DATA:  Altered mental status. EXAM: CT HEAD WITHOUT CONTRAST TECHNIQUE: Contiguous axial images were obtained from the base of the skull through the vertex without intravenous contrast. COMPARISON:  CT scan of July 31, 2014. FINDINGS: Bony calvarium appears intact. No mass effect or midline shift is noted. Ventricular size is within normal limits. There is no evidence of mass lesion, hemorrhage or acute infarction. IMPRESSION: Normal head CT. Electronically Signed   By: Marijo Conception, M.D.   On: 09/14/2015 20:19   US Abdomen Limited  10/09/2015  CLINICAL DATA:  Cirrhosis, ascites, for paracentesis EXAM: LIMITED ABDOMEN ULTRASOUND FOR ASCITES TECHNIQUE: Limited ultrasound survey for ascites was performed in all four abdominal quadrants. COMPARISON:  CT abdomen and pelvis 09/27/2015 FINDINGS: Small amounts of ascites are seen in the lower quadrants bilaterally. Volume of ascites appears significantly decreased since the previous CT. Amount of fluid present  appears insufficient for expected therapeutic benefit from paracentesis. IMPRESSION: Low volume ascites ; paracentesis not performed. Electronically Signed   By: Lavonia Dana M.D.   On: 10/09/2015 15:01   US Abdomen Limited  09/17/2015  CLINICAL DATA:  Cirrhosis, ascites, question sufficient ascites for paracentesis EXAM: LIMITED ABDOMEN ULTRASOUND FOR ASCITES TECHNIQUE: Limited ultrasound survey for ascites was performed in all four abdominal quadrants. COMPARISON:  Paracentesis 08/22/2015 FINDINGS: A small amount of ascites is identified perihepatic and in the low pelvis, predominately medially. Volume of ascites is substantially less than was seen on the previous exam when 2.3 L was removed. No adequate collection in the flanks or lower quadrants is identified to suggest significant clinical benefit to repeat paracentesis at this time. IMPRESSION: Scattered ascites predominately perihepatic and in the pelvis, insufficient for expected therapeutic benefit from paracentesis at this time. Discussed with Dr. Roderic Palau. Electronically Signed  By: Lavonia Dana M.D.   On: 09/17/2015 13:45   Dg Chest Port 1 View  09/14/2015  CLINICAL DATA:  AMS, EMS reports wife called EMS for patient altered since this morning. Patient has hx of liver failure per EMS, HISTORY OF ASTHMA, DM, HTN, COPD, COCAINE ABUSE, BEST IMAGES OBTAINED DUE TO PATIENTS CONDITION EXAM: PORTABLE CHEST 1 VIEW COMPARISON:  08/22/2015 FINDINGS: Enlarged cardiac. Lungs are clear. No effusion, infiltrate or pneumothorax. No acute osseous abnormality. IMPRESSION: Cardiomegaly without acute findings. Electronically Signed   By: Suzy Bouchard M.D.   On: 09/14/2015 18:43   Dg Shoulder Left  09/27/2015  CLINICAL DATA:  Status post fall 2 weeks ago striking the left arm ; fell again yesterday. The patient is unable to abduct the arm and was unable to tolerate an axillary view. EXAM: LEFT SHOULDER - 2+ VIEW COMPARISON:  Chest x-ray of August 22, 2015 which  included a portion of the left shoulder. FINDINGS: The bones of the shoulder are reasonably well mineralized for age. No acute fracture nor dislocation is observed. The glenohumeral joint appears normal. There are degenerative changes of the [facility] joint with joint space narrowing and small marginal spurs on the acromion. There are subacromial spurs as well. The body of the scapula is grossly intact. The observed portions of the left clavicle and upper left ribs appear normal. IMPRESSION: No acute fracture nor dislocation. Degenerative changes of the [facility] joint are present. There is subacromial spurring likely impacting the rotator cuff. Given the inability to abduct the arm rotator cuff pathology may be present. If the patient can undergo the procedure, MRI of the left shoulder would be a useful next imaging step. Electronically Signed   By: David  Martinique M.D.   On: 09/27/2015 11:51    Microbiology: No results found for this or any previous visit (from the past 240 hour(s)).   Labs: Basic Metabolic Panel:  Recent Labs Lab 10/08/15 1802 10/10/15 0728  NA 134* 137  K 4.4 4.5  CL 103 105  CO2 22 23  GLUCOSE 165* 187*  BUN 49* 48*  CREATININE 1.95* 2.16*  CALCIUM 8.4* 8.4*   Liver Function Tests:  Recent Labs Lab 10/09/15 1244 10/10/15 0728  AST  --  94*  ALT  --  39  ALKPHOS  --  93  BILITOT  --  2.9*  PROT  --  6.6  ALBUMIN 2.5* 2.9*   No results for input(s): LIPASE, AMYLASE in the last 168 hours.  Recent Labs Lab 10/08/15 1931 10/10/15 0733  AMMONIA 120* 108*   CBC:  Recent Labs Lab 10/08/15 1802 10/10/15 0728  WBC 4.5 8.5  HGB 8.8* 8.7*  HCT 26.1* 25.9*  MCV 96.0 96.6  PLT 60* 59*   Cardiac Enzymes: No results for input(s): CKTOTAL, CKMB, CKMBINDEX, TROPONINI in the last 168 hours. BNP: BNP (last 3 results)  Recent Labs  06/11/15 1546  BNP 89.0    ProBNP (last 3 results) No results for input(s): PROBNP in the last 8760 hours.  CBG:  Recent Labs Lab  10/08/15 2114 10/09/15 1615 10/09/15 2039 10/10/15 0725 10/10/15 1123  GLUCAP 126* 200* 118* 168* 191*       Signed:  Moises Terpstra MD.  [facility] 10/10/2015, 1:54 PM

## 2015-10-10 NOTE — Clinical Social Work Note (Signed)
CSW met with patient who advised that he had concerns about his medical bills .  CSW advised patient that this was an area he needed to discuss with the financial counselor, Adline Potter.  CSW advised Adline Potter that patient wanted to speak with her regarding his medical bills.   CSW signing off.    Ihor Gully, [place] [phone]

## 2015-10-10 NOTE — Progress Notes (Signed)
IV removed, site WNL.  Pt given d/c instructions and list of when to take prescriptions.  Discussed all home medications (when, how, and why to take), patient verbalizes understanding. Discussed home care with patient, teachback completed. F/U appointment to be made by patient/family as needed. Hospice arranged to come see patient tomorrow. Pt taken to main entrance in wheelchair by staff member.

## 2015-10-10 NOTE — Care Management Note (Signed)
Case Management Note  Patient Details  Name: [name] MRN: [id number] Date of Birth: 27-Jun-1961  Subjective/Objective:   Spoke with Larena Glassman at [facility]. Patient accepted for home with hospice. Informed Tasha with palliative care who stated that she had spoken with patient this morning who confirms this plan. Will contact Hospice at time of discharge.                   Action/Plan: Home with Hospice.  Expected Discharge Date:                  Expected Discharge Plan:  Home w Hospice Care  In-House Referral:     Discharge planning Services  CM Consult  Post Acute Care Choice:    Choice offered to:     DME Arranged:    DME Agency:     HH Arranged:    [place] Agency:     Status of Service:  Completed, signed off  Medicare Important Message Given:    Date Medicare IM Given:    Medicare IM give by:    Date Additional Medicare IM Given:    Additional Medicare Important Message give by:     If discussed at [place] of Stay Meetings, dates discussed:    Additional Comments:  Alvie Heidelberg, RN 10/10/2015, 12:27 PM

## 2015-10-10 NOTE — ED Provider Notes (Signed)
CSN: [id number]     Arrival date & time 10/08/15  1717 History   First MD Initiated Contact with Patient 10/08/15 1802     Chief Complaint  Patient presents with  . Altered Mental Status     (Consider location/radiation/quality/duration/timing/severity/associated sxs/prior Treatment) Patient is a 55 y.o. male presenting with altered mental status.  Altered Mental Status Presenting symptoms: behavior changes, confusion and partial responsiveness   Severity:  Moderate Most recent episode:  Today Episode history:  Single Timing:  Constant Chronicity:  Recurrent Context: not taking medications as prescribed and recent change in medication   Context: not a recent illness and not a recent infection   Associated symptoms: abdominal pain and light-headedness   Associated symptoms: no fever, no headaches and no vomiting     Past Medical History  Diagnosis Date  . Hypertension   . Diabetes mellitus   . GERD (gastroesophageal reflux disease)     DEC 2010 EGD/Bx REACTIVE GASTROPATHY, NO VARICES  . Hemorrhoids, internal   . BMI 40.0-44.9, adult (HCC) OCT 2010 269 LBS    APR 2012 279 LBS AUG 2014 185 LBS  . Cirrhosis (HCC) NOV 2010 CHILD PUGH A    ETOH/HCV/OBESITY  . IV drug abuse REMOTE  . Hepatitis 2010 HEP C    AST 509 ALT 267 ALK PHOS 165 ALB 3.8 NEG IGM HAV/HBSAg.   . Gallstone AUG 2012 1 CM  . GERD 10/25/2009  . COPD (chronic obstructive pulmonary disease) (HCC)   . Hepatitis C     failed interferon/ribavirin. treated with ribavirin/sofosbuvir/declastasvir 2016, ?failure or early relapse unable to determine because patient was noncompliant.  . Pancytopenia (HCC) 2013  . Splenomegaly 2013  . Other pancytopenia (HCC) 02/18/2013  . Iron (Fe) deficiency anemia 02/18/2013  . Splenomegaly 02/18/2013  . Neuropathy (HCC)   . Biliary dyskinesia JUL 2015    HIDA GB EF 5%  . Chronic pain   . Chronic leg pain     left  . Portal venous hypertension (HCC)   . Asthma   . Medically  noncompliant 05/30/2015  . Renal insufficiency   . Cocaine abuse    Past Surgical History  Procedure Laterality Date  . Sigmoidoscopy      2001 DR. FLEISCHMAN INTERNAL HERMORRHOIDS  . Upper gastrointestinal endoscopy  DEC 2010    BENIGN POLYPS, GASTRITIS, ?phg  . Knee surgery  RIGHT  . Hemorrhoid surgery    . Biopsy  09/08/2011    Dr. Fields:Moderate gastritis/Polyps, multiple in the body of the stomach  . Colonoscopy with propofol N/A 11/15/2013    Dr. Fields: rectal varices, small AVMs  . Esophagogastroduodenoscopy (egd) with propofol N/A 11/15/2013    Dr. 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 Next EGD in April 2016.     Family History  Problem Relation Age of Onset  . Colon cancer Neg Hx   . Anesthesia problems Neg Hx   . Hypotension Neg Hx   . Malignant hyperthermia Neg Hx   . Pseudochol deficiency Neg Hx   . Cancer Father    Social History  Substance Use Topics  . Smoking status: Never Smoker   . Smokeless tobacco: Never Used  . Alcohol Use: No     Comment: 30 years ago    Review of Systems  Constitutional: Negative for fever, chills and activity change.  HENT: Negative for congestion and rhinorrhea.   Eyes: Negative for visual disturbance.  Respiratory: Negative for cough   and shortness of breath.   Cardiovascular: Negative for chest pain.  Gastrointestinal: Positive for abdominal pain, diarrhea and abdominal distention. Negative for vomiting and constipation.  Endocrine: Negative for polyuria.  Genitourinary: Negative for dysuria and flank pain.  Musculoskeletal: Negative for back pain and neck pain.  Skin: Negative for wound.  Neurological: Positive for light-headedness. Negative for headaches.  Psychiatric/Behavioral: Positive for confusion.      Allergies  Penicillins  Home Medications   Prior to Admission medications   Medication Sig Start Date End Date Taking? Authorizing Provider   albuterol (PROVENTIL HFA;VENTOLIN HFA) 108 (90 Base) MCG/ACT inhaler Inhale 2 puffs into the lungs every 6 (six) hours as needed. Shortness of breath 09/18/15  Yes Stephen M Miller, MD  citalopram (CELEXA) 20 MG tablet Take 1 tablet (20 mg total) by mouth daily. 08/30/15  Yes Stephen M Miller, MD  cyclobenzaprine (FLEXERIL) 5 MG tablet Take 1 tablet (5 mg total) by mouth at bedtime. 09/28/15  Yes Stephen M Miller, MD  glipiZIDE (GLUCOTROL) 10 MG tablet TAKE 1 TABLET (10 MG TOTAL) BY MOUTH DAILY. 08/30/15  Yes Stephen M Miller, MD  hydrOXYzine (ATARAX/VISTARIL) 10 MG tablet TAKE 1 TABLET (10 MG TOTAL) BY MOUTH EVERY 4 (FOUR) HOURS AS NEEDED FOR ITCHING. 08/31/15  Yes Donald W Moore, MD  Insulin Glargine (LANTUS SOLOSTAR) 100 UNIT/ML Solostar Pen Inject 10 Units into the skin at bedtime.   Yes Historical Provider, MD  lactulose (CHRONULAC) 10 GM/15ML solution Take 15 mLs (10 g total) by mouth daily. TAKE 15 MLS (10 G TOTAL) BY MOUTH 3 (THREE) TIMES DAILY. 10/05/15  Yes Stephen M Miller, MD  magnesium oxide (MAG-OX) 400 (241.3 MG) MG tablet Take 1 tablet (400 mg total) by mouth 2 (two) times daily. 07/17/15  Yes Gokul Krishnan, MD  omeprazole (PRILOSEC) 20 MG capsule Take 2 capsules (40 mg total) by mouth daily. 08/30/15  Yes Stephen M Miller, MD  Oxycodone HCl 10 MG TABS Take 1 tablet (10 mg total) by mouth every 6 (six) hours as needed. Patient taking differently: Take 10 mg by mouth every 6 (six) hours as needed (for pain).  09/28/15  Yes Stephen M Miller, MD  rifaximin (XIFAXAN) 550 MG TABS tablet Take 1 tablet (550 mg total) by mouth 2 (two) times daily. 09/17/15  Yes Jehanzeb Memon, MD  silver sulfADIAZINE (SILVADENE) 1 % cream Apply 1 application topically daily. Apply to burn area daily. 09/19/15  Yes Stephen M Miller, MD  spironolactone (ALDACTONE) 50 MG tablet Take 1 tablet (50 mg total) by mouth daily. 07/17/15  Yes Gokul Krishnan, MD  torsemide (DEMADEX) 20 MG tablet Take 2 tablets (40 mg total) by mouth  daily. 09/28/15  Yes Stephen M Miller, MD  zolpidem (AMBIEN) 10 MG tablet TAKE 1 TABLET BY MOUTH AT NIGHT AS NEEDED FOR SLEEP Patient taking differently: Take 10 mg by mouth at bedtime as needed for sleep.  09/28/15  Yes Stephen M Miller, MD  glucose blood test strip Use as instructed 09/18/15   Stephen M Miller, MD  Insulin Pen Needle (BD PEN NEEDLE NANO U/F) 32G X 4 MM MISC Inject 1 application into the skin at bedtime. 08/31/15   Donald W Moore, MD   BP 136/94 mmHg  Pulse 105  Temp(Src) 98.2 F (36.8 C) (Axillary)  Resp 20  Ht 5' 5" (1.651 m)  Wt 309 lb 8 oz (140.388 kg)  BMI 51.50 kg/m2  SpO2 100% Physical Exam  Constitutional: He appears well-developed and well-nourished.  HENT:    Head: Normocephalic and atraumatic.  Neck: Normal range of motion.  Cardiovascular: Normal rate.   Pulmonary/Chest: Effort normal. No respiratory distress. He has rales (slight, in the bases).  Abdominal: Bowel sounds are normal. He exhibits distension. There is tenderness. There is no rebound and no guarding.  Musculoskeletal: Normal range of motion. He exhibits no edema or tenderness.  Neurological: He is alert.  Nursing note and vitals reviewed.   ED Course  Procedures (including critical care time) Labs Review Labs Reviewed  BASIC METABOLIC PANEL - Abnormal; Notable for the following:    Sodium 134 (*)    Glucose, Bld 165 (*)    BUN 49 (*)    Creatinine, Ser 1.95 (*)    Calcium 8.4 (*)    GFR calc non Af Amer 37 (*)    GFR calc Af Amer 43 (*)    All other components within normal limits  CBC - Abnormal; Notable for the following:    RBC 2.72 (*)    Hemoglobin 8.8 (*)    HCT 26.1 (*)    Platelets 60 (*)    All other components within normal limits  AMMONIA - Abnormal; Notable for the following:    Ammonia 120 (*)    All other components within normal limits  PROTIME-INR - Abnormal; Notable for the following:    Prothrombin Time 17.4 (*)    All other components within normal limits   ALBUMIN - Abnormal; Notable for the following:    Albumin 2.5 (*)    All other components within normal limits  GLUCOSE, CAPILLARY - Abnormal; Notable for the following:    Glucose-Capillary 200 (*)    All other components within normal limits  CBC - Abnormal; Notable for the following:    RBC 2.68 (*)    Hemoglobin 8.7 (*)    HCT 25.9 (*)    Platelets 59 (*)    All other components within normal limits  COMPREHENSIVE METABOLIC PANEL - Abnormal; Notable for the following:    Glucose, Bld 187 (*)    BUN 48 (*)    Creatinine, Ser 2.16 (*)    Calcium 8.4 (*)    Albumin 2.9 (*)    AST 94 (*)    Total Bilirubin 2.9 (*)    GFR calc non Af Amer 33 (*)    GFR calc Af Amer 38 (*)    All other components within normal limits  AMMONIA - Abnormal; Notable for the following:    Ammonia 108 (*)    All other components within normal limits  GLUCOSE, CAPILLARY - Abnormal; Notable for the following:    Glucose-Capillary 118 (*)    All other components within normal limits  GLUCOSE, CAPILLARY - Abnormal; Notable for the following:    Glucose-Capillary 168 (*)    All other components within normal limits  GLUCOSE, CAPILLARY - Abnormal; Notable for the following:    Glucose-Capillary 191 (*)    All other components within normal limits  CBG MONITORING, ED - Abnormal; Notable for the following:    Glucose-Capillary 132 (*)    All other components within normal limits  CBG MONITORING, ED - Abnormal; Notable for the following:    Glucose-Capillary 126 (*)    All other components within normal limits    Imaging Review Us Abdomen Limited  10/09/2015  CLINICAL DATA:  Cirrhosis, ascites, for paracentesis EXAM: LIMITED ABDOMEN ULTRASOUND FOR ASCITES TECHNIQUE: Limited ultrasound survey for ascites was performed in all four abdominal quadrants. COMPARISON:  CT   abdomen and pelvis 09/27/2015 FINDINGS: Small amounts of ascites are seen in the lower quadrants bilaterally. Volume of ascites appears  significantly decreased since the previous CT. Amount of fluid present appears insufficient for expected therapeutic benefit from paracentesis. IMPRESSION: Low volume ascites ; paracentesis not performed. Electronically Signed   By: Lavonia Dana M.D.   On: 10/09/2015 15:01   I have personally reviewed and evaluated these images and lab results as part of my medical decision-making.   EKG Interpretation   Date/Time:  Monday October 08 2015 17:35:54 EST Ventricular Rate:  87 PR Interval:  158 QRS Duration: 74 QT Interval:  412 QTC Calculation: 495 R Axis:   103 Text Interpretation:  Normal sinus rhythm Rightward axis Septal infarct ,  age undetermined Abnormal ECG Nonspecific T wave abnormality Confirmed by  Wyvonnia Dusky  MD, STEPHEN [phone]) on 10/08/2015 6:23:52 PM      MDM   Final diagnoses:  Altered mental status  Ascites  Hepatic encephalopathy  55 yo M here with AMS, likely related to hepatic encephalopathy. May/may not be taking lactulose as prescribed. Ammonia elevated here, will admit for monitoring and management. No obvious infectious causes at this time. No emergent indication for paracentesis at this time.     Merrily Pew, MD 10/10/15 1159

## 2015-10-10 NOTE — Consult Note (Signed)
Consultation Note Date: 10/10/2015   Patient Name: [name]  DOB: 1961/03/16  MRN: [id number]  Age / Sex: 55 y.o., male  PCP: [name], MD Referring Physician: Rexene Alberts, MD  Reason for Consultation: Disposition, Establishing goals of care, Hospice Evaluation, Pain control and Psychosocial/spiritual support    Clinical Assessment/Narrative: Mr. Lofton is sitting up in bed. His wife enters the room a few minutes after I. We talk about disposition, and they share that they have decided to go home with hospice. They share their worry over pain and anxiety control. And I share that hospice is the best at symptom management. Mrs. Turbyfill shares that Mr. Prisk is tired of back and forth to the hospital. We talk about leaning on hospice team. And having someone available from nursing, social worker, and spiritual support.  Contacts/Participants in Discussion: Mr. and Mrs. Steinborn Primary Decision Maker: Mr. Gongaware Relationship to Patient self  SUMMARY OF RECOMMENDATIONS  Code Status/Advance Care Planning: DNR    Code Status Orders        Start     Ordered   10/09/15 0102  Do not attempt resuscitation (DNR)   Continuous    Question Answer Comment  In the event of cardiac or respiratory ARREST Do not call a "code blue"   In the event of cardiac or respiratory ARREST Do not perform Intubation, CPR, defibrillation or ACLS   In the event of cardiac or respiratory ARREST Use medication by any route, position, wound care, and other measures to relive pain and suffering. May use oxygen, suction and manual treatment of airway obstruction as needed for comfort.      10/09/15 0101    Code Status History    Date Active Date Inactive Code Status Order ID Comments User Context   09/15/2015  9:22 AM 09/17/2015  8:22 PM DNR [id number]  Domenic Polite, MD Inpatient   09/14/2015 11:13 PM 09/15/2015  9:21 AM Full Code  [id number]  Orvan Falconer, MD Inpatient   08/22/2015  8:05 PM 08/29/2015  7:30 PM Full Code [id number]  Doree Albee, MD ED   07/02/2015  3:01 PM 07/17/2015  5:34 PM Full Code [id number]  Erline Hau, MD Inpatient   06/11/2015  7:42 PM 06/26/2015  7:25 PM Full Code [id number]  Doree Albee, MD Inpatient   01/15/2015  8:19 PM 01/18/2015  7:04 PM Full Code [id number]  Doree Albee, MD Inpatient   07/31/2014  8:54 PM 08/02/2014  5:29 PM Full Code [id number]  Oswald Hillock, MD Inpatient   01/29/2014  4:10 PM 01/31/2014  7:40 PM DNR [id number]  Kathie Dike, MD Inpatient   10/24/2013  9:49 PM 10/27/2013  5:02 PM DNR [id number]  Oswald Hillock, MD ED   07/23/2013  1:10 AM 07/25/2013  6:25 PM Full Code [id number]  Phillips Grout, MD Inpatient   03/20/2013  6:31 PM 03/23/2013  7:13 PM Full Code [id number]  Samuella Cota, MD Inpatient   03/20/2013  6:23 PM 03/20/2013  6:31 PM Full Code [id number]  Charlynn Court, RN Inpatient      Other Directives:None  Symptom Management:   per hospitalist, pain and symptom management per hospice protocol.  Palliative Prophylaxis:   Bowel Regimen, Frequent Pain Assessment, Oral Care and Turn Reposition  Additional Recommendations (Limitations, Scope, Preferences):  Treat the treatable   Psycho-social/Spiritual:  Support System: Adequate Desire for further Chaplaincy support: ongoing Additional Recommendations: Education on Hospice  Prognosis: < 3 months, likely based  on end-stage liver disease.  Discharge Planning: Home with Hospice   Chief Complaint/ Primary Diagnoses: Present on Admission:  . Hepatic encephalopathy (HCC) . Chronic hepatitis C with cirrhosis (HCC) . Ascites . Thrombocytopenia (HCC)  I have reviewed the medical record, interviewed the patient and family, and examined the patient. The following aspects are pertinent.  Past Medical History  Diagnosis Date  . Hypertension   . Diabetes mellitus   . GERD (gastroesophageal reflux  disease)     DEC 2010 EGD/Bx REACTIVE GASTROPATHY, NO VARICES  . Hemorrhoids, internal   . BMI 40.0-44.9, adult (HCC) OCT 2010 269 LBS    APR 2012 279 LBS AUG 2014 185 LBS  . Cirrhosis (HCC) NOV 2010 CHILD PUGH A    ETOH/HCV/OBESITY  . IV drug abuse REMOTE  . Hepatitis 2010 HEP C    AST 509 ALT 267 ALK PHOS 165 ALB 3.8 NEG IGM HAV/HBSAg.   . Gallstone AUG 2012 1 CM  . GERD 10/25/2009  . COPD (chronic obstructive pulmonary disease) (HCC)   . Hepatitis C     failed interferon/ribavirin. treated with ribavirin/sofosbuvir/declastasvir 2016, ?failure or early relapse unable to determine because patient was noncompliant.  . Pancytopenia (HCC) 2013  . Splenomegaly 2013  . Other pancytopenia (HCC) 02/18/2013  . Iron (Fe) deficiency anemia 02/18/2013  . Splenomegaly 02/18/2013  . Neuropathy (HCC)   . Biliary dyskinesia JUL 2015    HIDA GB EF 5%  . Chronic pain   . Chronic leg pain     left  . Portal venous hypertension (HCC)   . Asthma   . Medically noncompliant 05/30/2015  . Renal insufficiency   . Cocaine abuse    Social History   Social History  . Marital Status: Married    Spouse Name: N/A  . Number of Children: N/A  . Years of Education: N/A   Social History Main Topics  . Smoking status: Never Smoker   . Smokeless tobacco: Never Used  . Alcohol Use: No     Comment: 30 years ago  . Drug Use: No     Comment: 30 yrs ago.  Marland Kitchen Sexual Activity:    Partners: Female    Copy: Condom     Comment: spouse   Other Topics Concern  . None   Social History Narrative   Family History  Problem Relation Age of Onset  . Colon cancer Neg Hx   . Anesthesia problems Neg Hx   . Hypotension Neg Hx   . Malignant hyperthermia Neg Hx   . Pseudochol deficiency Neg Hx   . Cancer Father    Scheduled Meds: . insulin aspart  0-15 Units Subcutaneous TID WC  . insulin aspart  0-5 Units Subcutaneous QHS  . lactulose  20 g Oral TID  . sodium chloride flush  3 mL  Intravenous Q12H  . spironolactone  50 mg Oral Daily  . torsemide  40 mg Oral Daily   Continuous Infusions:  PRN Meds:.sodium chloride, acetaminophen **OR** acetaminophen, alum & mag hydroxide-simeth, bisacodyl, HYDROmorphone (DILAUDID) injection, LORazepam, LORazepam, morphine CONCENTRATE, ondansetron **OR** ondansetron (ZOFRAN) IV, polyethylene glycol, sodium chloride flush Medications Prior to Admission:  Prior to Admission medications   Medication Sig Start Date End Date Taking? Authorizing Provider  albuterol (PROVENTIL HFA;VENTOLIN HFA) 108 (90 Base) MCG/ACT inhaler Inhale 2 puffs into the lungs every 6 (six) hours as needed. Shortness of breath 09/18/15  Yes Frederica Kuster, MD  citalopram (CELEXA) 20 MG tablet Take 1 tablet (20  mg total) by mouth daily. 08/30/15  Yes [name], MD  cyclobenzaprine (FLEXERIL) 5 MG tablet Take 1 tablet (5 mg total) by mouth at bedtime. 09/28/15  Yes [name], MD  hydrOXYzine (ATARAX/VISTARIL) 10 MG tablet TAKE 1 TABLET (10 MG TOTAL) BY MOUTH EVERY 4 (FOUR) HOURS AS NEEDED FOR ITCHING. 08/31/15  Yes Chipper Herb, MD  Insulin Glargine (LANTUS SOLOSTAR) 100 UNIT/ML Solostar Pen Inject 10 Units into the skin at bedtime.   Yes Historical Provider, MD  magnesium oxide (MAG-OX) 400 (241.3 MG) MG tablet Take 1 tablet (400 mg total) by mouth 2 (two) times daily. 07/17/15  Yes Bonnielee Haff, MD  omeprazole (PRILOSEC) 20 MG capsule Take 2 capsules (40 mg total) by mouth daily. 08/30/15  Yes [name], MD  Oxycodone HCl 10 MG TABS Take 1 tablet (10 mg total) by mouth every 6 (six) hours as needed. Patient taking differently: Take 10 mg by mouth every 6 (six) hours as needed (for pain).  09/28/15  Yes [name], MD  rifaximin (XIFAXAN) 550 MG TABS tablet Take 1 tablet (550 mg total) by mouth 2 (two) times daily. 09/17/15  Yes Kathie Dike, MD  silver sulfADIAZINE (SILVADENE) 1 % cream Apply 1 application topically daily. Apply to burn area daily.  09/19/15  Yes [name], MD  spironolactone (ALDACTONE) 50 MG tablet Take 1 tablet (50 mg total) by mouth daily. 07/17/15  Yes Bonnielee Haff, MD  torsemide (DEMADEX) 20 MG tablet Take 2 tablets (40 mg total) by mouth daily. 09/28/15  Yes [name], MD  zolpidem (AMBIEN) 10 MG tablet TAKE 1 TABLET BY MOUTH AT NIGHT AS NEEDED FOR SLEEP Patient taking differently: Take 10 mg by mouth at bedtime as needed for sleep.  09/28/15  Yes [name], MD  glipiZIDE (GLUCOTROL) 10 MG tablet DO NOT GIVE THIS MEDICATION IF HIS BLOOD SUGAR IS LESS THAN 120. TAKE 1 TABLET (10 MG TOTAL) BY MOUTH DAILY. 10/10/15   Rexene Alberts, MD  glucose blood test strip Use as instructed 09/18/15   [name], MD  Insulin Pen Needle (BD PEN NEEDLE NANO U/F) 32G X 4 MM MISC Inject 1 application into the skin at bedtime. 08/31/15   Chipper Herb, MD  lactulose (CHRONULAC) 10 GM/15ML solution Take 22.5 mLs (15 g total) by mouth daily. TAKE 15 MLS (10 G TOTAL) BY MOUTH 3 (THREE) TIMES DAILY. 10/10/15   Rexene Alberts, MD   Allergies  Allergen Reactions  . Penicillins Hives    Has patient had a PCN reaction causing immediate rash, facial/tongue/throat swelling, SOB or lightheadedness with hypotension: Yes Has patient had a PCN reaction causing severe rash involving mucus membranes or skin necrosis: Yes Has patient had a PCN reaction that required hospitalization No Has patient had a PCN reaction occurring within the last 10 years: No If all of the above answers are "NO", then may proceed with Cephalosporin use.     Review of Systems  Unable to perform ROS: Other    Physical Exam  Nursing note and vitals reviewed. Constitutional: No distress.  HENT:  Head: Normocephalic and atraumatic.  Respiratory: Effort normal.  GI: He exhibits distension.  Firm, obese, nontender.  Musculoskeletal:  Anasarca, bilateral lower extremities    Vital Signs: BP 167/65 mmHg  Pulse 86  Temp(Src) 97.8 F (36.6 C) (Oral)   Resp 20  Ht '5\' 5"'$  (1.651 m)  Wt 140.388 kg (309 lb 8 oz)  BMI 51.50 kg/m2  SpO2 100%  SpO2: SpO2: 100 % O2 Device:SpO2: 100 % O2 Flow Rate: .   IO: Intake/output summary:  Intake/Output Summary (Last 24 hours) at 10/10/15 1533 Last data filed at 10/10/15 1200  Gross per 24 hour  Intake    960 ml  Output      0 ml  Net    960 ml    LBM: Last BM Date: 10/09/15 Baseline Weight: Weight: (!) 145.151 kg (320 lb) Most recent weight: Weight: (!) 140.388 kg (309 lb 8 oz)      Palliative Assessment/Data:  Flowsheet Rows        Most Recent Value   Intake Tab    Referral Department  Hospitalist   Unit at Time of Referral  Med/Surg Unit   Palliative Care Primary Diagnosis  Nephrology   Date Notified  10/09/15   Palliative Care Type  Return patient Palliative Care   Reason for referral  Clarify Goals of Care   Date of Admission  10/08/15   Date first seen by Palliative Care  10/10/15   # of days Palliative referral response time  1 Day(s)   # of days IP prior to Palliative referral  1   Clinical Assessment    Palliative Performance Scale Score  40%   Pain Max last 24 hours  7   Pain Min Last 24 hours  4   Dyspnea Max Last 24 Hours  5   Dyspnea Min Last 24 hours  3   Psychosocial & Spiritual Assessment    Palliative Care Outcomes    Patient/Family meeting held?  Yes   Who was at the meeting?  patient and wife   Palliative Care Outcomes  Clarified goals of care, Counseled regarding hospice, Provided psychosocial or spiritual support   Patient/Family wishes: Interventions discontinued/not started   Mechanical Ventilation   Palliative Care follow-up planned  No      Additional Data Reviewed:  CBC:    Component Value Date/Time   WBC 8.5 10/10/2015 0728   WBC 3.7 04/10/2015 1609   HGB 8.7* 10/10/2015 0728   HCT 25.9* 10/10/2015 0728   HCT 35.7* 04/10/2015 1609   PLT 59* 10/10/2015 0728   PLT 57* 04/10/2015 1609   MCV 96.6 10/10/2015 0728   MCV 96 04/10/2015 1609    NEUTROABS 3.4 08/22/2015 1729   NEUTROABS 2.1 04/10/2015 1609   LYMPHSABS 1.4 08/22/2015 1729   LYMPHSABS 1.0 04/10/2015 1609   MONOABS 1.0 08/22/2015 1729   EOSABS 0.1 08/22/2015 1729   EOSABS 0.1 04/10/2015 1609   BASOSABS 0.0 08/22/2015 1729   BASOSABS 0.0 04/10/2015 1609   Comprehensive Metabolic Panel:    Component Value Date/Time   NA 137 10/10/2015 0728   NA 136 08/31/2015 1640   K 4.5 10/10/2015 0728   CL 105 10/10/2015 0728   CO2 23 10/10/2015 0728   BUN 48* 10/10/2015 0728   BUN 41* 08/31/2015 1640   CREATININE 2.16* 10/10/2015 0728   CREATININE 0.67 07/19/2012 0823   GLUCOSE 187* 10/10/2015 0728   GLUCOSE 220* 08/31/2015 1640   CALCIUM 8.4* 10/10/2015 0728   AST 94* 10/10/2015 0728   ALT 39 10/10/2015 0728   ALKPHOS 93 10/10/2015 0728   BILITOT 2.9* 10/10/2015 0728   BILITOT 3.0* 08/31/2015 1640   PROT 6.6 10/10/2015 0728   PROT 6.3 08/31/2015 1640   ALBUMIN 2.9* 10/10/2015 0728   ALBUMIN 2.9* 08/31/2015 1640     Time In: 1115 Time Out: 1225 Time Total: 70 minutes Greater  than 50%  of this time was spent counseling and coordinating care related to the above assessment and plan.  Signed by: Drue Novel, NP  Drue Novel, NP  10/10/2015, 3:33 PM  Please contact Palliative Medicine Team phone at [phone] for questions and concerns.

## 2015-10-11 ENCOUNTER — Ambulatory Visit: Payer: Medicare Other | Admitting: Family Medicine

## 2015-10-11 ENCOUNTER — Telehealth: Payer: Self-pay | Admitting: *Deleted

## 2015-10-11 MED ORDER — CITALOPRAM HYDROBROMIDE 20 MG PO TABS: 20.0000 mg | ORAL_TABLET | Freq: Every day | ORAL | Status: AC

## 2015-10-11 MED ORDER — ONDANSETRON HCL 4 MG PO TABS: 4.0000 mg | ORAL_TABLET | Freq: Three times a day (TID) | ORAL | Status: AC | PRN

## 2015-10-11 MED ORDER — HYDROXYZINE HCL 10 MG PO TABS: ORAL_TABLET | ORAL | Status: AC

## 2015-10-11 MED ORDER — OXYCODONE HCL 10 MG PO TABS: 10.0000 mg | ORAL_TABLET | Freq: Four times a day (QID) | ORAL | Status: AC | PRN

## 2015-10-11 NOTE — Telephone Encounter (Signed)
Admitted to hospice, needs refills on Hydrocodone and Hydrozaline, also wants a zofran order and restrart celexa. Dr. Sabra Heck aware and gave verbal orders to beth

## 2015-10-12 ENCOUNTER — Other Ambulatory Visit: Payer: Self-pay | Admitting: *Deleted

## 2015-10-12 ENCOUNTER — Encounter: Payer: Self-pay | Admitting: *Deleted

## 2015-10-12 ENCOUNTER — Ambulatory Visit: Payer: Self-pay | Admitting: *Deleted

## 2015-10-12 NOTE — Patient Outreach (Signed)
RN spoke with patient's wife [name] who reports pt came home from hospital on 10/11/15 and [facility] has seen pt and he is on their services now, RN CM confirmed this with Field seismologist at [facility] [place].  Today's scheduled home visit cancelled and hospice RN will be seeing pt again today. [name] [name] appreciative of [facility] services and reports "he's got everything he needs"  RN CM provided active listening, encouragement.  RN CM faxed letter to primary MD Dr. Sabra Heck informing case closed.  [name] [facility], [place] Coordinator [phone]

## 2015-10-17 ENCOUNTER — Other Ambulatory Visit: Payer: Self-pay | Admitting: *Deleted

## 2015-10-17 DIAGNOSIS — Z515 Encounter for palliative care: Secondary | ICD-10-CM

## 2015-10-17 NOTE — Progress Notes (Unsigned)
DNR scanned into chart. Patient under hospice care

## 2015-10-19 NOTE — Telephone Encounter (Signed)
Patient was admitted to hospital on 2/27

## 2015-10-20 ENCOUNTER — Other Ambulatory Visit: Payer: Self-pay | Admitting: Family Medicine

## 2015-10-22 ENCOUNTER — Telehealth: Payer: Self-pay | Admitting: *Deleted

## 2015-10-22 NOTE — Telephone Encounter (Signed)
Give them 1 month and then need to talk with Dr Sabra Heck about it, xanax can be bid prn for 1 month

## 2015-10-22 NOTE — Telephone Encounter (Signed)
Hospice nurse called and needs torsemide, ambien and a new rx for alprazolam called into [place]. He is hospice so when nurse calls this in say hospice because they will pay or it. He is agitatied and need xanax!

## 2015-10-22 NOTE — Telephone Encounter (Signed)
Hospice in needing lorazepam not alprazolam please advise.

## 2015-10-22 NOTE — Telephone Encounter (Signed)
Per [place] Lorazepam 1 mg BID PRN. All medications called into pharmacy.

## 2015-10-23 ENCOUNTER — Telehealth: Payer: Self-pay | Admitting: Family Medicine

## 2015-10-23 NOTE — Telephone Encounter (Signed)
done

## 2015-10-29 ENCOUNTER — Other Ambulatory Visit: Payer: Self-pay | Admitting: Family Medicine

## 2015-10-29 NOTE — Telephone Encounter (Signed)
Pt is aware we have some waiting up front.

## 2015-10-30 ENCOUNTER — Encounter: Payer: Self-pay | Admitting: Gastroenterology

## 2015-10-31 ENCOUNTER — Ambulatory Visit: Payer: Medicare Other | Admitting: Urology

## 2015-11-02 ENCOUNTER — Telehealth: Payer: Self-pay | Admitting: Gastroenterology

## 2015-11-06 NOTE — Telephone Encounter (Signed)
REVIEWED. SO SAD.

## 2015-11-10 NOTE — Telephone Encounter (Signed)
PATIENT WIFE CALLED AND STATED THAT HE PASSED AWAY AT ABOUT 2AM THIS MORNING,  SHE WANTED TO THANK YOU FOR EVERYTHING YOU DID FOR HIM.

## 2015-11-10 DEATH — deceased

## 2015-11-28 ENCOUNTER — Other Ambulatory Visit (HOSPITAL_COMMUNITY): Payer: Medicare Other

## 2016-01-25 ENCOUNTER — Other Ambulatory Visit: Payer: Self-pay | Admitting: Nurse Practitioner

## 2016-02-28 ENCOUNTER — Ambulatory Visit (HOSPITAL_COMMUNITY): Payer: Medicare Other | Admitting: Oncology

## 2016-02-28 ENCOUNTER — Other Ambulatory Visit (HOSPITAL_COMMUNITY): Payer: Medicare Other

## 2016-03-20 NOTE — Telephone Encounter (Signed)
Called WIFE TO CHECK ON HER. SHE RECALLED. CRIES ALL THE TIME. BEEN TAKING MEDS FOR DEPRESSION. INITIALLY O MED THAT MADE HER FEEL LIKE SHE WAS GOING TO COMMIT SUICIDE. ABLE TO SLEEP WITH ALTERNATIVE MEDICINE.

## 2016-12-28 IMAGING — DX DG CHEST 2V
2 series · 2 of 2 positions shown · non-contrast
Comparison: 06/11/2015

CLINICAL DATA: Short of breath

EXAM:
CHEST  2 VIEW

[chest lat]
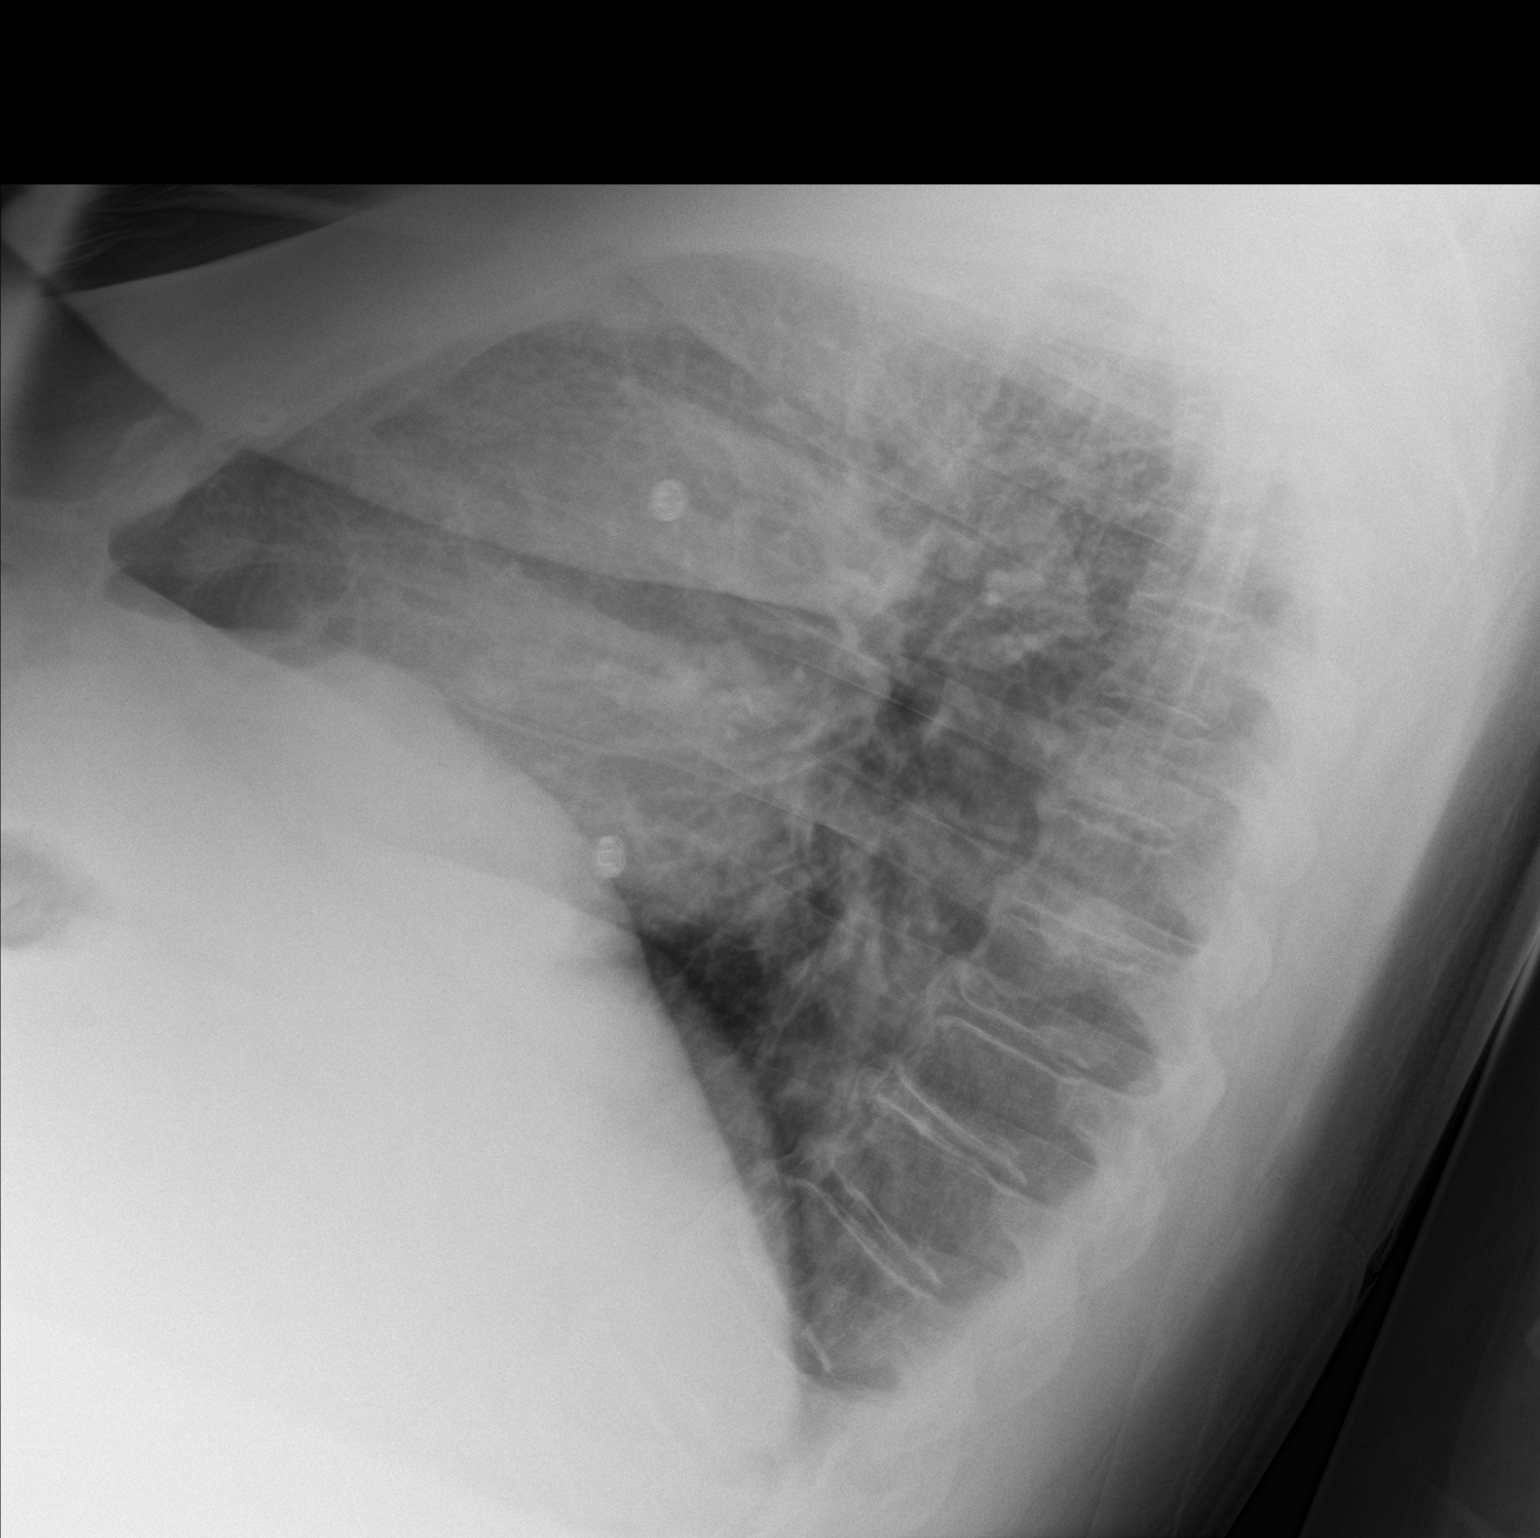

[chest ap]
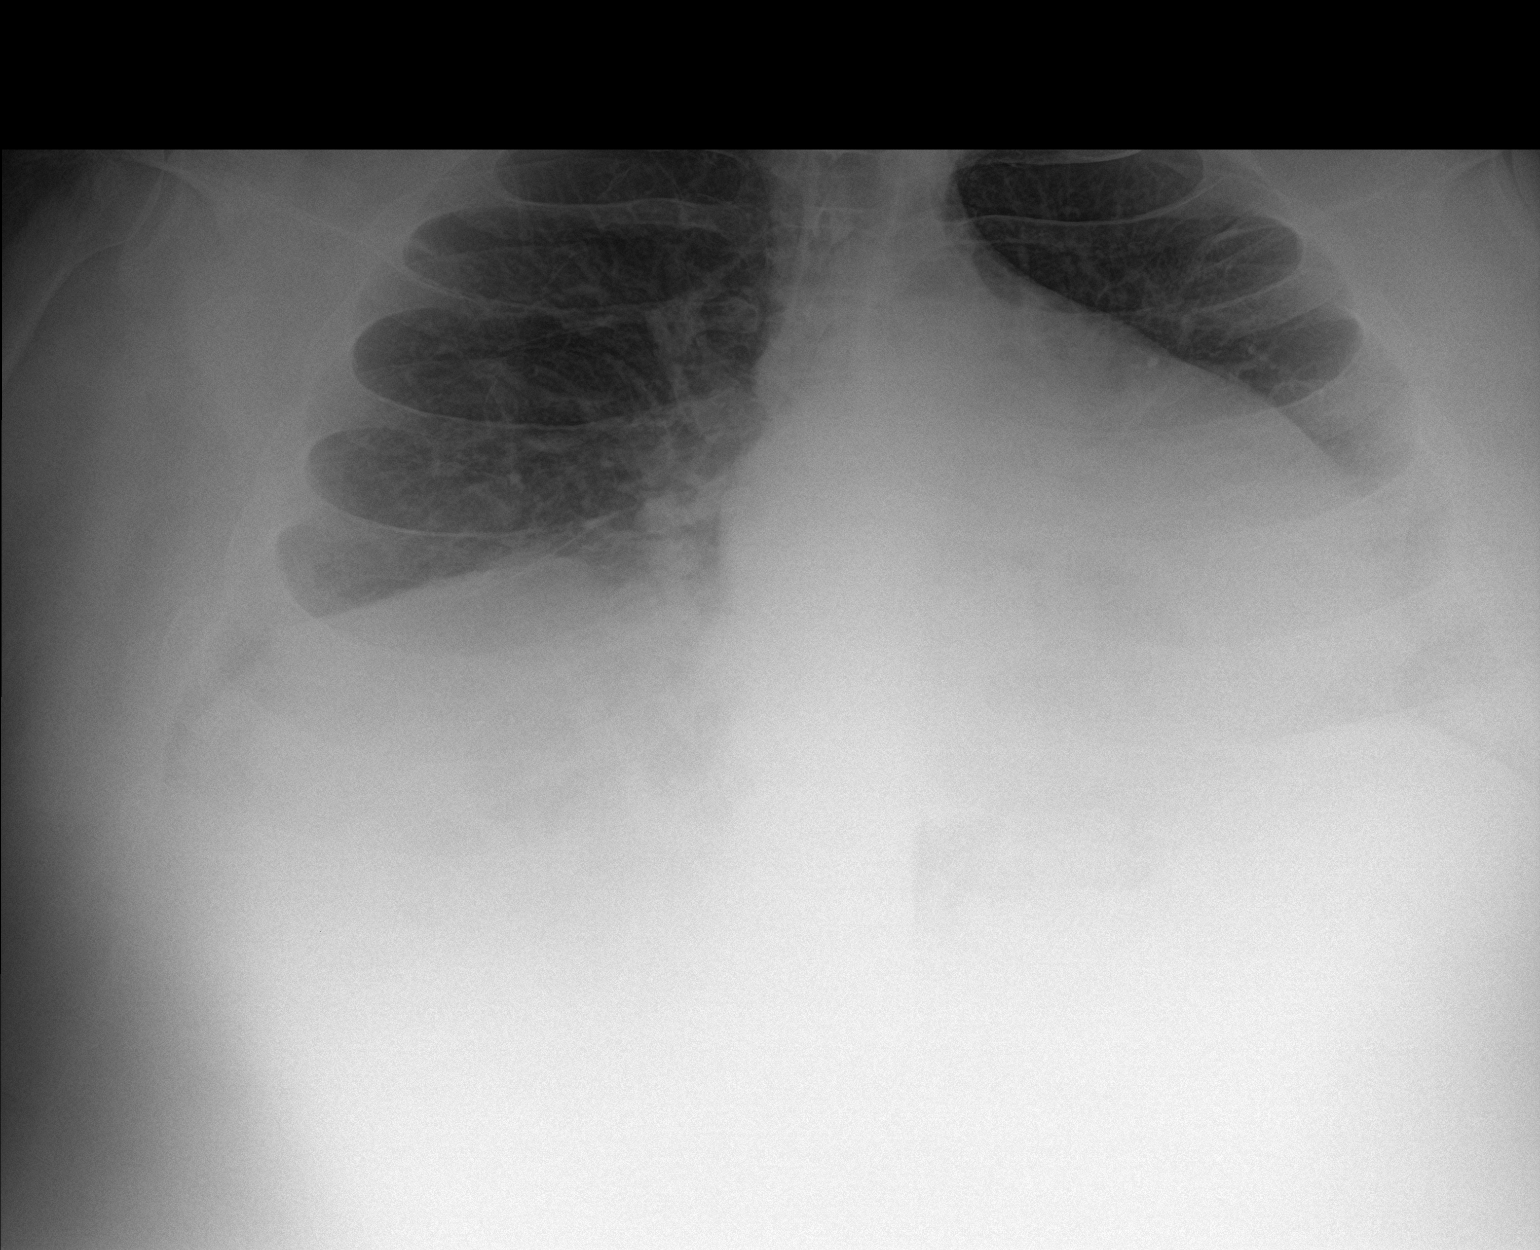

[2 of 2 positions shown; findings below may reference images not displayed]

FINDINGS: Normal heart size. Semi recumbent AP view of the lungs with low
volumes. Grossly clear. No pneumothorax. No pleural effusion. No
obvious pneumothorax.
IMPRESSION: Low volumes.  No active cardiopulmonary disease.

## 2017-02-17 IMAGING — DX DG CHEST 2V
2 series · 2 of 2 positions shown · non-contrast
Comparison: 07/02/2015

CLINICAL DATA: Productive cough and shortness of breath for 2
weeks. COPD. Cirrhosis.

EXAM:
CHEST  2 VIEW

[chest pa]
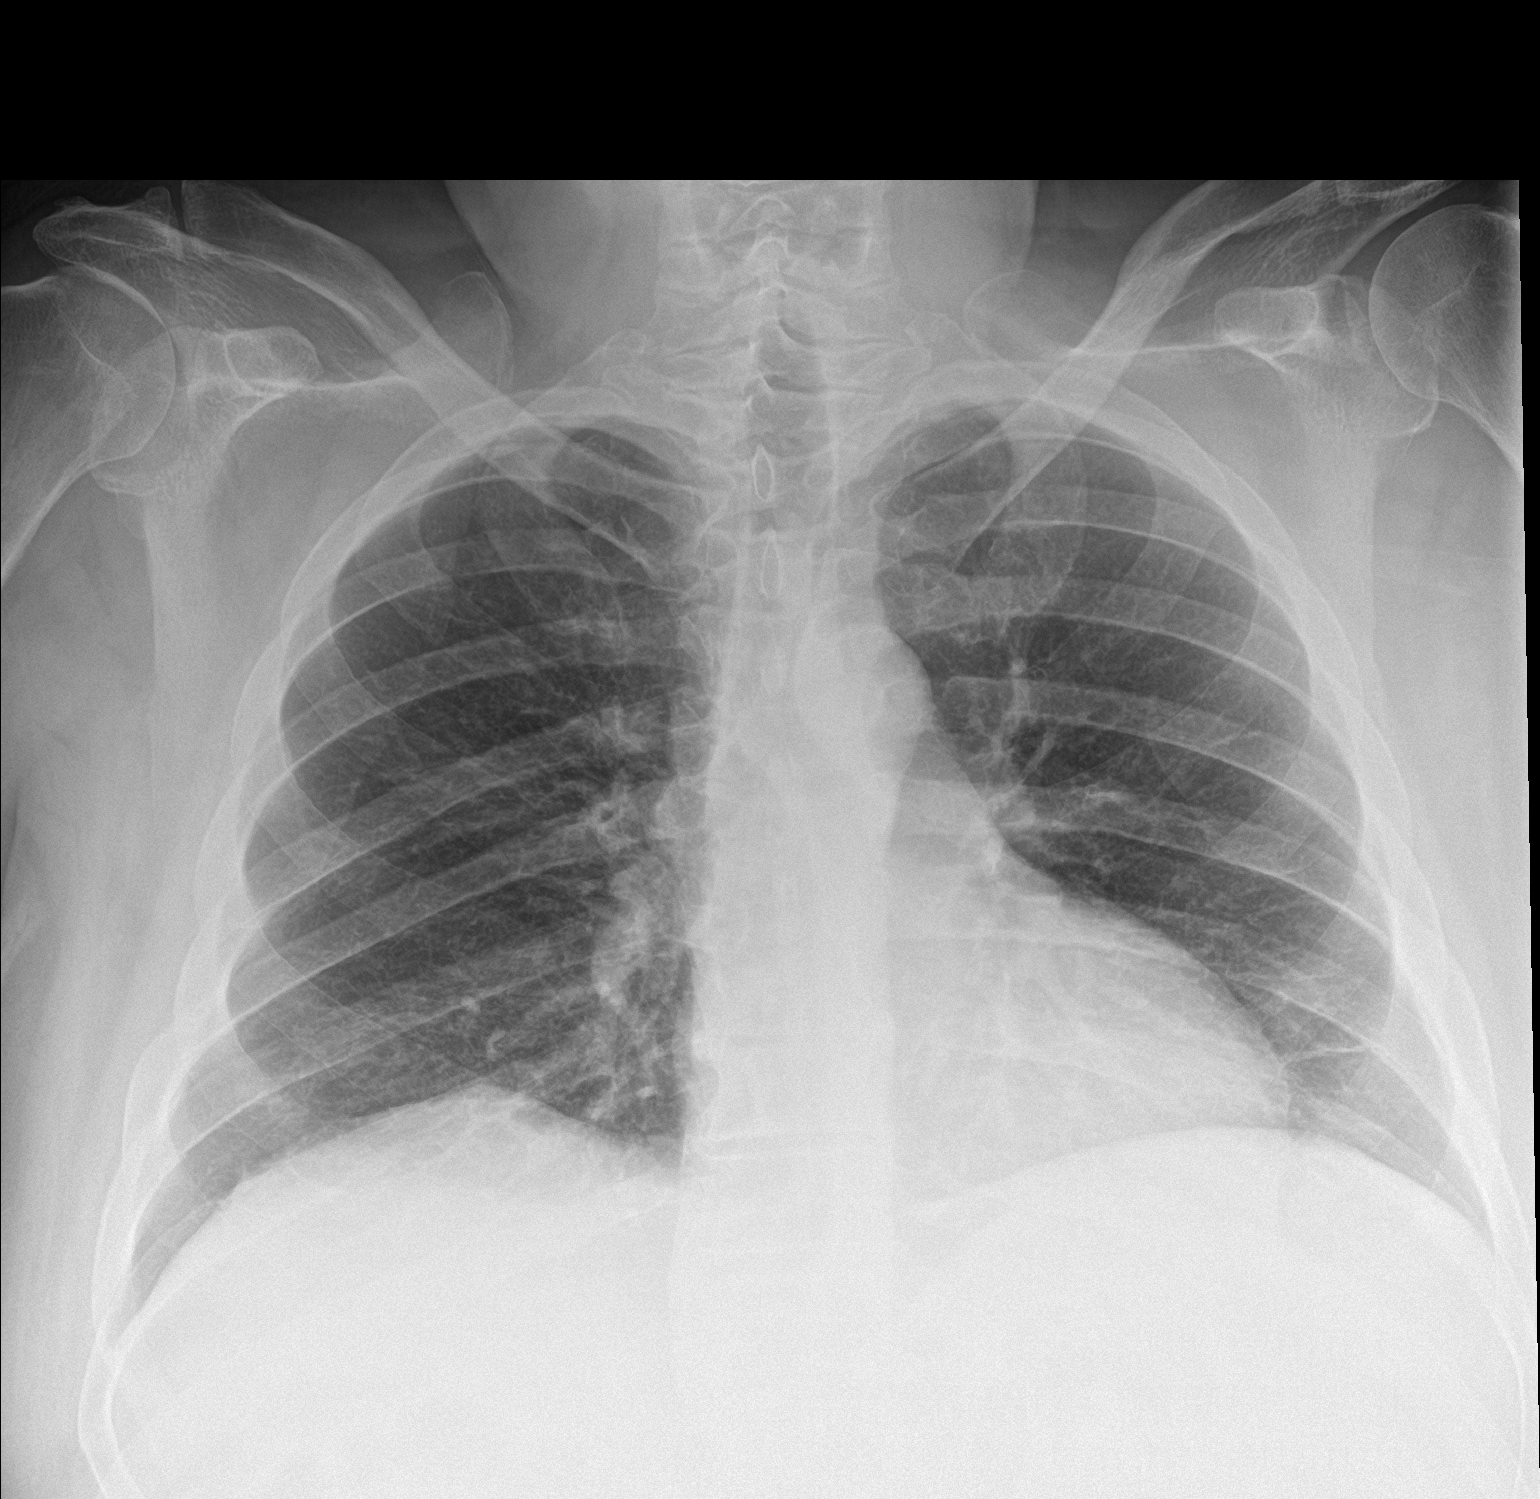

[chest lat]
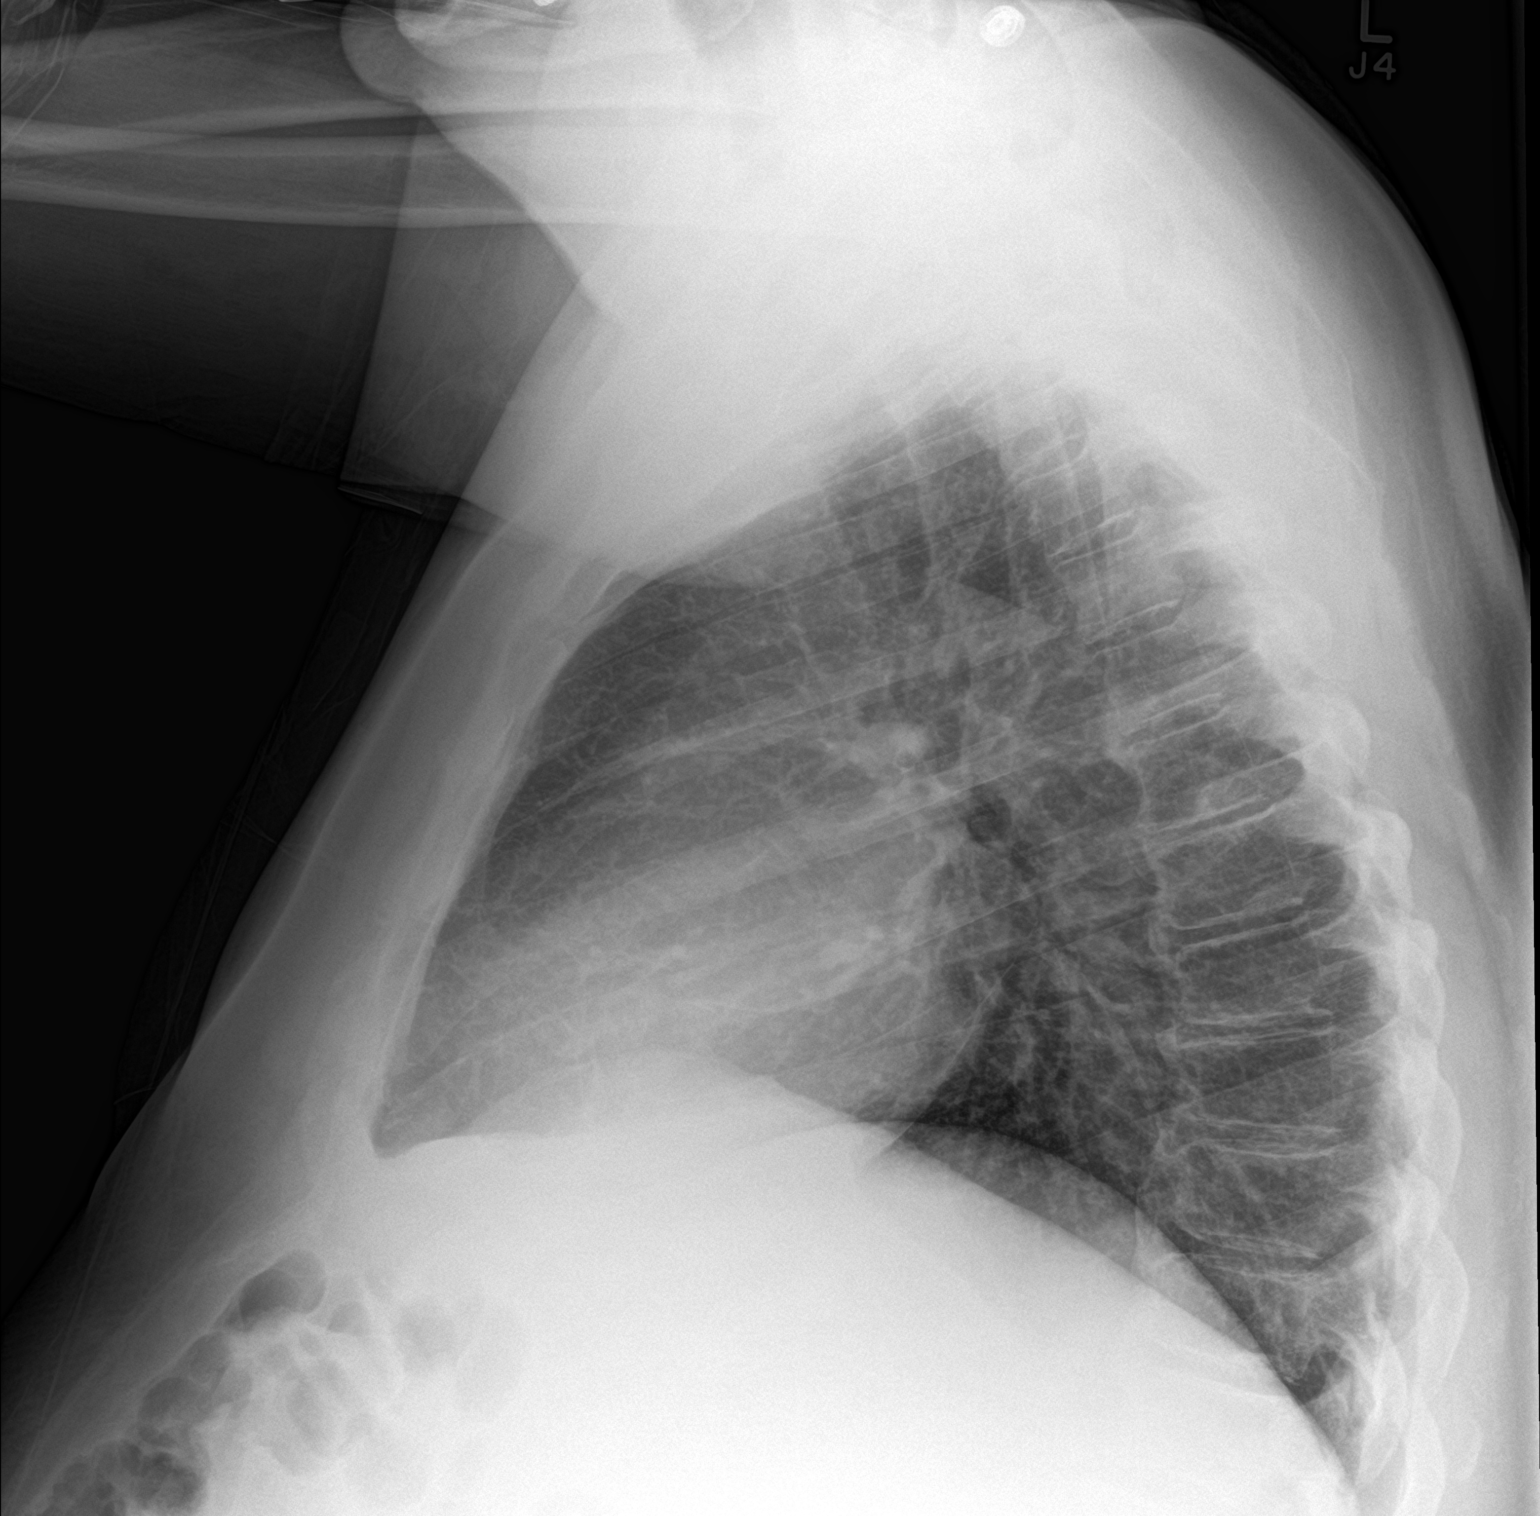

[2 of 2 positions shown; findings below may reference images not displayed]

FINDINGS: The heart size and mediastinal contours are within normal limits.
Both lungs are clear. The visualized skeletal structures are
unremarkable.
IMPRESSION: No active cardiopulmonary disease.

## 2017-05-10 IMAGING — US US ART/VEN ABD/PELV/SCROTUM DOPPLER LTD
1 series · 13 of 25 positions shown · non-contrast
Comparison: CT abdomen pelvis 01/15/2015 which included the
scrotum.

CLINICAL DATA: Two day history of right lower extremity pain and
erythema. Blistering involving the right groin. Current history of
hypertension and diabetes. Current history of hepatic cirrhosis.

EXAM:
SCROTAL ULTRASOUND
DOPPLER ULTRASOUND OF THE TESTICLES
TECHNIQUE: Complete ultrasound examination of the testicles, epididymis, and
other scrotal structures was performed. Color and spectral Doppler
ultrasound were also utilized to evaluate blood flow to the
testicles.

[Series 1: us art/ven abd/pelv/scrotum doppler ltd · 0.09mm/px · 13 of 64 slices shown]
[im 1/64]
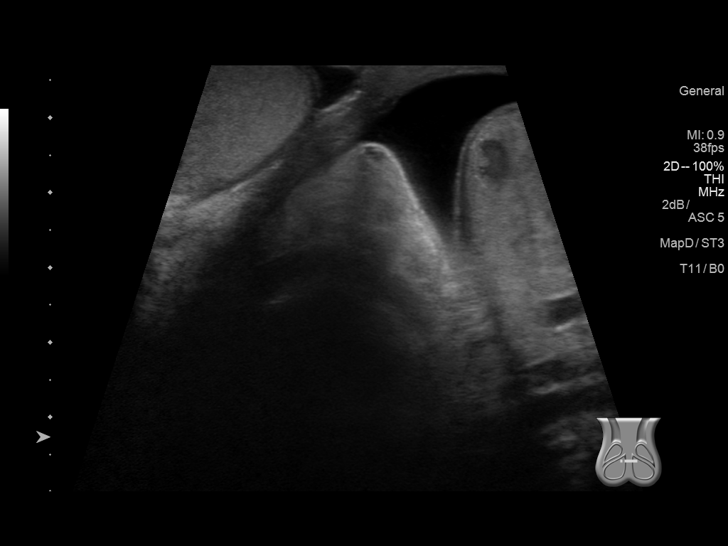
[im 6/64]
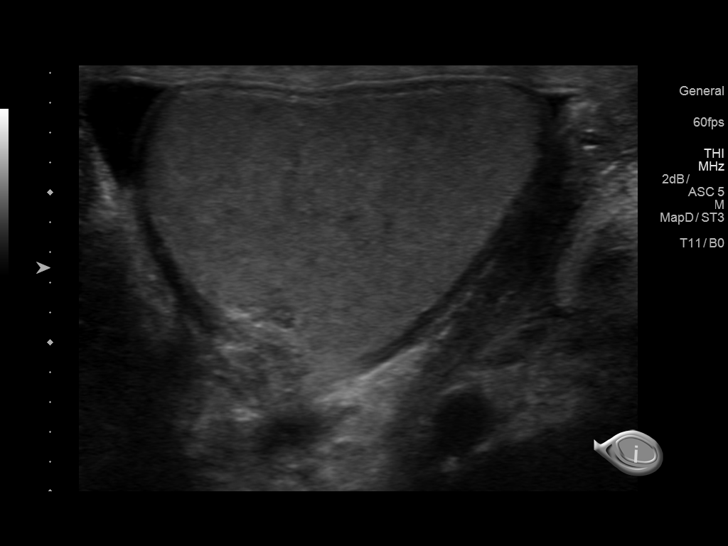
[im 11/64]
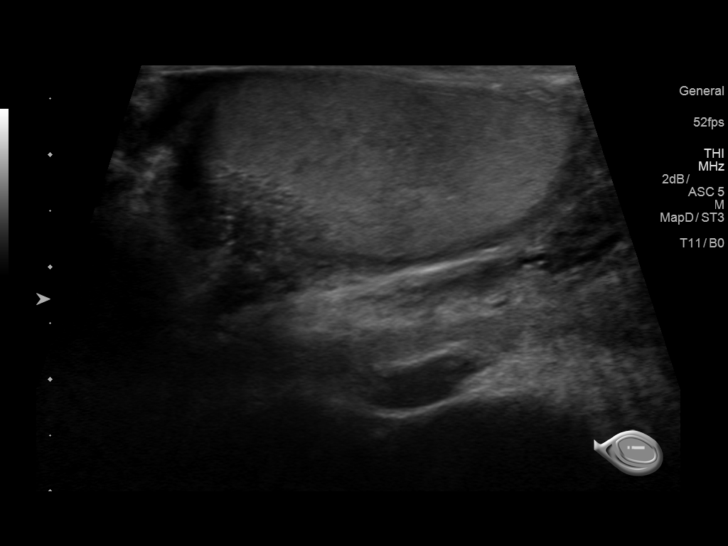
[im 16/64]
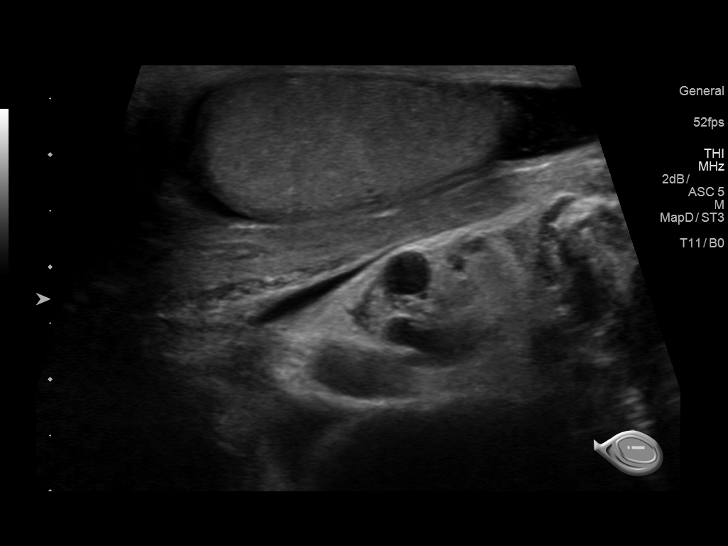
[im 22/64]
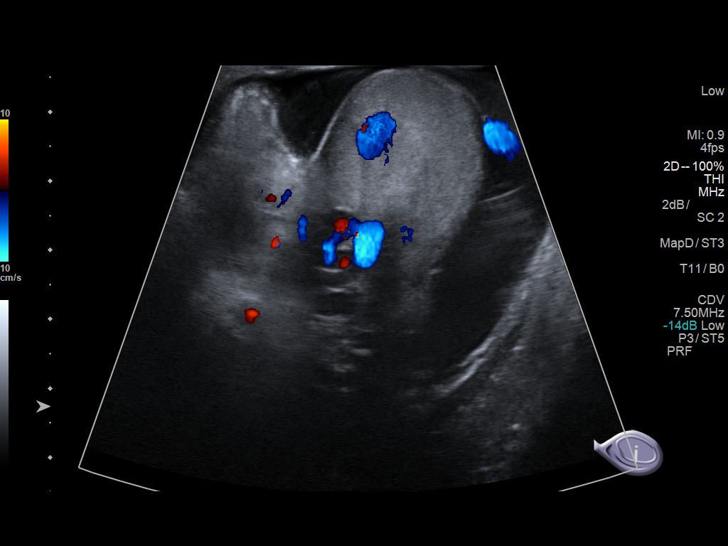
[im 27/64]
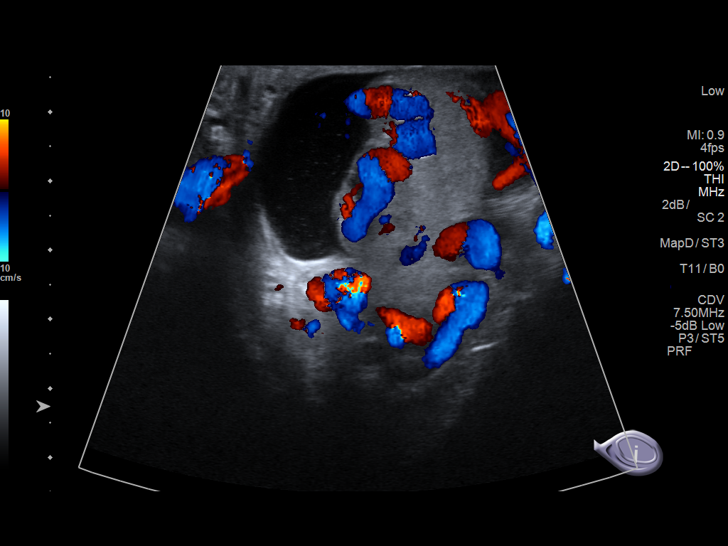
[im 32/64]
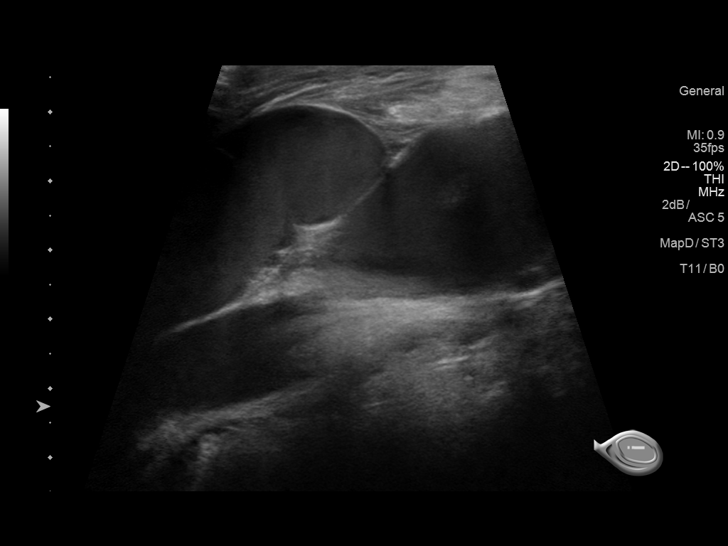
[im 37/64]
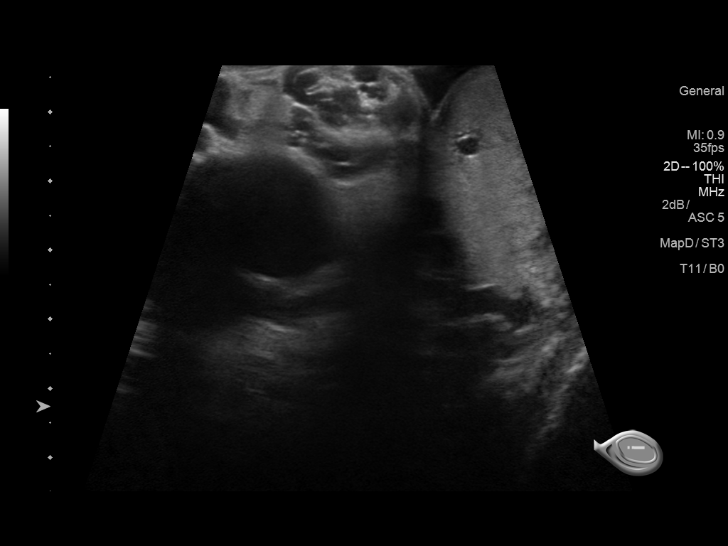
[im 43/64]
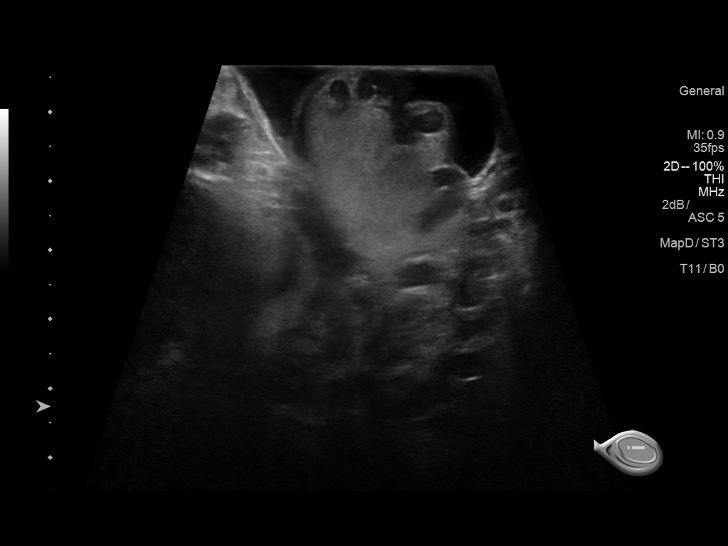
[im 48/64]
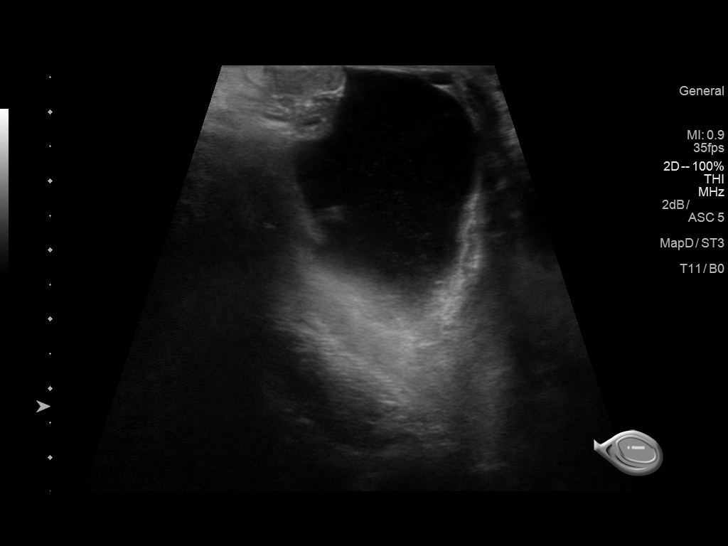
[im 53/64]
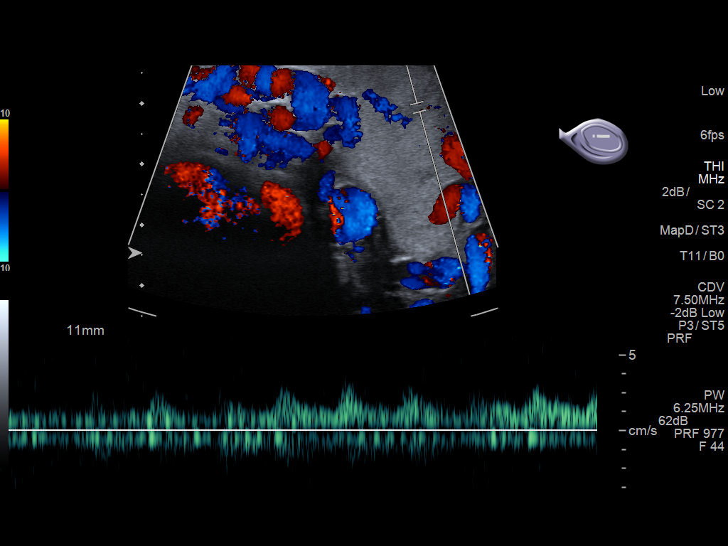
[im 58/64]
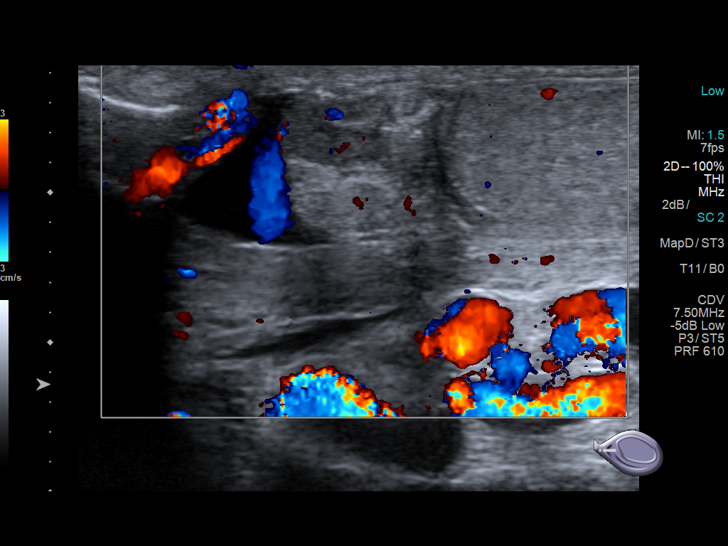
[im 64/64]
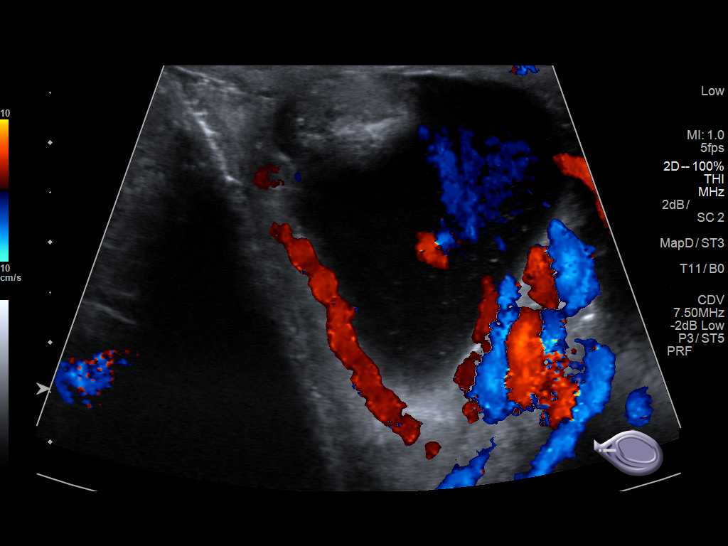

[13 of 25 positions shown; findings below may reference images not displayed]

FINDINGS: Right testicle

Measurements: Approximately 4.8 x 1.7 x 2.6 cm. Normal parenchymal
echotexture without mass or microlithiasis. Normal color Doppler
flow without evidence of hyperemia.

Left testicle

Measurements: Approximately 4.4 x 2.3 x 2.5 cm. Normal parenchymal
echotexture without mass or microlithiasis. Intratesticular varix.
Normal color Doppler flow otherwise without evidence of hyperemia.

Right epididymis: Normal in size and appearance without evidence of
hyperemia.

Left epididymis: Normal in size and appearance without evidence of
hyperemia.

Hydrocele: Small right hydrocele. Moderately large left hydrocele.
Present on the prior CT and likely unchanged.

Varicocele:  Large left varicocele as noted on the prior CT.

Pulsed Doppler interrogation of both testes demonstrates normal low
resistance arterial and venous waveforms bilaterally.
IMPRESSION: 1. No evidence of testicular torsion or epididymo-orchitis.
2. Large left varicocele, including an intratesticular varix.
3. Moderately large left hydrocele and small right hydrocele.
4. Above findings were present on the prior CT from January 2015, and
are not significantly changed
